# Patient Record
Sex: Female | Born: 1945 | Race: White | Hispanic: No | Smoking: Former smoker
Health system: Southern US, Community
[De-identification: ages and names within clinical notes are randomized; demographics above are authoritative.]

## PROBLEM LIST (undated history)

## (undated) DIAGNOSIS — C7889 Secondary malignant neoplasm of other digestive organs: Secondary | ICD-10-CM

## (undated) DIAGNOSIS — I214 Non-ST elevation (NSTEMI) myocardial infarction: Secondary | ICD-10-CM

## (undated) DIAGNOSIS — J189 Pneumonia, unspecified organism: Secondary | ICD-10-CM

## (undated) DIAGNOSIS — K219 Gastro-esophageal reflux disease without esophagitis: Secondary | ICD-10-CM

## (undated) DIAGNOSIS — F419 Anxiety disorder, unspecified: Secondary | ICD-10-CM

## (undated) DIAGNOSIS — C801 Malignant (primary) neoplasm, unspecified: Secondary | ICD-10-CM

## (undated) DIAGNOSIS — J9610 Chronic respiratory failure, unspecified whether with hypoxia or hypercapnia: Secondary | ICD-10-CM

## (undated) DIAGNOSIS — C787 Secondary malignant neoplasm of liver and intrahepatic bile duct: Secondary | ICD-10-CM

## (undated) DIAGNOSIS — Z87891 Personal history of nicotine dependence: Secondary | ICD-10-CM

## (undated) DIAGNOSIS — E785 Hyperlipidemia, unspecified: Secondary | ICD-10-CM

## (undated) DIAGNOSIS — R531 Weakness: Secondary | ICD-10-CM

## (undated) DIAGNOSIS — J449 Chronic obstructive pulmonary disease, unspecified: Secondary | ICD-10-CM

## (undated) HISTORY — DX: Weakness: R53.1

## (undated) HISTORY — DX: Pneumonia, unspecified organism: J18.9

## (undated) HISTORY — DX: Anxiety disorder, unspecified: F41.9

## (undated) HISTORY — PX: ANGIOPLASTY: SHX39

---

## 1997-11-01 ENCOUNTER — Other Ambulatory Visit: Admission: RE | Admit: 1997-11-01 | Discharge: 1997-11-01 | Payer: Self-pay | Admitting: Family Medicine

## 2001-05-27 ENCOUNTER — Encounter: Payer: Self-pay | Admitting: Family Medicine

## 2001-05-27 ENCOUNTER — Encounter: Admission: RE | Admit: 2001-05-27 | Discharge: 2001-05-27 | Payer: Self-pay | Admitting: Family Medicine

## 2003-09-03 ENCOUNTER — Encounter: Admission: RE | Admit: 2003-09-03 | Discharge: 2003-09-03 | Payer: Self-pay | Admitting: Family Medicine

## 2004-05-04 ENCOUNTER — Emergency Department (HOSPITAL_COMMUNITY): Admission: EM | Admit: 2004-05-04 | Discharge: 2004-05-04 | Payer: Self-pay | Admitting: Emergency Medicine

## 2004-09-21 ENCOUNTER — Encounter: Admission: RE | Admit: 2004-09-21 | Discharge: 2004-09-21 | Payer: Self-pay | Admitting: Family Medicine

## 2004-12-19 ENCOUNTER — Ambulatory Visit (HOSPITAL_COMMUNITY): Admission: RE | Admit: 2004-12-19 | Discharge: 2004-12-19 | Payer: Self-pay | Admitting: Family Medicine

## 2010-04-27 ENCOUNTER — Encounter
Admission: RE | Admit: 2010-04-27 | Discharge: 2010-04-27 | Payer: Self-pay | Source: Home / Self Care | Attending: Family Medicine | Admitting: Family Medicine

## 2011-10-02 ENCOUNTER — Other Ambulatory Visit: Payer: Self-pay | Admitting: Family Medicine

## 2011-10-02 DIAGNOSIS — Z1231 Encounter for screening mammogram for malignant neoplasm of breast: Secondary | ICD-10-CM

## 2011-10-02 DIAGNOSIS — M8580 Other specified disorders of bone density and structure, unspecified site: Secondary | ICD-10-CM

## 2011-10-22 ENCOUNTER — Ambulatory Visit
Admission: RE | Admit: 2011-10-22 | Discharge: 2011-10-22 | Disposition: A | Discharge status: Home / Self Care | Payer: Medicare Other | Source: Ambulatory Visit | Attending: Family Medicine | Admitting: Family Medicine

## 2011-10-22 DIAGNOSIS — Z1231 Encounter for screening mammogram for malignant neoplasm of breast: Secondary | ICD-10-CM

## 2011-10-22 DIAGNOSIS — M8580 Other specified disorders of bone density and structure, unspecified site: Secondary | ICD-10-CM

## 2012-05-22 ENCOUNTER — Emergency Department (HOSPITAL_COMMUNITY): Payer: Medicare Other

## 2012-05-22 ENCOUNTER — Encounter (HOSPITAL_COMMUNITY): Payer: Self-pay | Admitting: Emergency Medicine

## 2012-05-22 ENCOUNTER — Emergency Department (HOSPITAL_COMMUNITY)
Admission: EM | Admit: 2012-05-22 | Discharge: 2012-05-23 | Disposition: A | Discharge status: Home / Self Care | Payer: Medicare Other | Attending: Emergency Medicine | Admitting: Emergency Medicine

## 2012-05-22 DIAGNOSIS — J441 Chronic obstructive pulmonary disease with (acute) exacerbation: Secondary | ICD-10-CM | POA: Insufficient documentation

## 2012-05-22 DIAGNOSIS — J449 Chronic obstructive pulmonary disease, unspecified: Secondary | ICD-10-CM

## 2012-05-22 DIAGNOSIS — J45901 Unspecified asthma with (acute) exacerbation: Secondary | ICD-10-CM | POA: Insufficient documentation

## 2012-05-22 DIAGNOSIS — R509 Fever, unspecified: Secondary | ICD-10-CM | POA: Insufficient documentation

## 2012-05-22 DIAGNOSIS — R05 Cough: Secondary | ICD-10-CM | POA: Insufficient documentation

## 2012-05-22 DIAGNOSIS — Z87891 Personal history of nicotine dependence: Secondary | ICD-10-CM | POA: Insufficient documentation

## 2012-05-22 DIAGNOSIS — R059 Cough, unspecified: Secondary | ICD-10-CM | POA: Insufficient documentation

## 2012-05-22 HISTORY — DX: Chronic obstructive pulmonary disease, unspecified: J44.9

## 2012-05-22 LAB — POCT I-STAT, CHEM 8
Glucose, Bld: 113 mg/dL — ABNORMAL HIGH (ref 70–99)
HCT: 45 % (ref 36.0–46.0)
Hemoglobin: 15.3 g/dL — ABNORMAL HIGH (ref 12.0–15.0)
Potassium: 3.8 mEq/L (ref 3.5–5.1)
Sodium: 138 mEq/L (ref 135–145)

## 2012-05-22 MED ORDER — IPRATROPIUM BROMIDE 0.02 % IN SOLN
0.5000 mg | Freq: Once | RESPIRATORY_TRACT | Status: AC
  Administered 2012-05-22: 0.5 mg via RESPIRATORY_TRACT
  Filled 2012-05-22: qty 2.5

## 2012-05-22 MED ORDER — METHYLPREDNISOLONE SODIUM SUCC 125 MG IJ SOLR
125.0000 mg | Freq: Once | INTRAMUSCULAR | Status: AC
  Administered 2012-05-22: 125 mg via INTRAVENOUS
  Filled 2012-05-22: qty 2

## 2012-05-22 MED ORDER — ALBUTEROL SULFATE (5 MG/ML) 0.5% IN NEBU
5.0000 mg | INHALATION_SOLUTION | Freq: Once | RESPIRATORY_TRACT | Status: AC
  Administered 2012-05-22: 5 mg via RESPIRATORY_TRACT
  Filled 2012-05-22: qty 1

## 2012-05-22 NOTE — ED Notes (Addendum)
Pt c/o sob that started today. Pt states she has had a productive cough with white frothy sputum. Denies any pain. Pt took inhaler pta and states she feels a little better. Pt is usually on 2L O2 at night for her COPD but states she has been on it all day because of the increased SOB. Pt states she also took 325 of aspirin around 7:30.

## 2012-05-22 NOTE — ED Provider Notes (Addendum)
History     CSN: [id number]  Arrival date & time 05/22/12  2051   First MD Initiated Contact with Patient 05/22/12 2105      Chief Complaint  Patient presents with  . Shortness of Breath    (Consider location/radiation/quality/duration/timing/severity/associated sxs/prior treatment) Patient is a 67 y.o. female presenting with shortness of breath. The history is provided by the patient.  Shortness of Breath  The current episode started today. The onset was gradual. The problem occurs continuously. The problem has been gradually worsening. The problem is severe. Nothing relieves the symptoms. The symptoms are aggravated by activity. Associated symptoms include a fever, cough, shortness of breath and wheezing. Pertinent negatives include no chest pain, no chest pressure and no orthopnea. Cough characteristics: frothy sputum that is clear. She has had intermittent steroid use. Her past medical history is significant for past wheezing. Urine output has been normal. The last void occurred less than 6 hours ago. She has received no recent medical care. Services Performed: took spiriva, albuterol and atrovent pta with significant improvement.  states she ran out of her o2 which she uses prn and was scared she would need it.    Past Medical History  Diagnosis Date  . COPD (chronic obstructive pulmonary disease)     History reviewed. No pertinent past surgical history.  Family History  Problem Relation Age of Onset  . Cancer Father     History  Substance Use Topics  . Smoking status: Former Games developer  . Smokeless tobacco: Never Used  . Alcohol Use: Yes     Comment: rarely    OB History    Grav Para Term Preterm Abortions TAB SAB Ect Mult Living                  Review of Systems  Constitutional: Positive for fever.  Respiratory: Positive for cough, shortness of breath and wheezing.   Cardiovascular: Negative for chest pain and orthopnea.  All other systems reviewed and are  negative.    Allergies  Codeine  Home Medications  No current outpatient prescriptions on file.  BP 128/78  Pulse 86  Temp 99 F (37.2 C) (Oral)  Resp 18  SpO2 100%  Physical Exam  Nursing note and vitals reviewed. Constitutional: She is oriented to person, place, and time. She appears well-developed and well-nourished. No distress.  HENT:  Head: Normocephalic and atraumatic.  Mouth/Throat: Oropharynx is clear and moist.  Eyes: Conjunctivae normal and EOM are normal. Pupils are equal, round, and reactive to light.  Neck: Normal range of motion. Neck supple.  Cardiovascular: Normal rate, regular rhythm and intact distal pulses.   No murmur heard. Pulmonary/Chest: Effort normal. Not tachypneic. No respiratory distress. She has decreased breath sounds. She has no wheezes. She has no rales.  Abdominal: Soft. She exhibits no distension. There is no tenderness. There is no rebound and no guarding.  Musculoskeletal: Normal range of motion. She exhibits no edema and no tenderness.  Neurological: She is alert and oriented to person, place, and time.  Skin: Skin is warm and dry. No rash noted. No erythema.  Psychiatric: She has a normal mood and affect. Her behavior is normal.    ED Course  Procedures (including critical care time)  Labs Reviewed  POCT I-STAT, CHEM 8 - Abnormal; Notable for the following:    Glucose, Bld 113 (*)     Hemoglobin 15.3 (*)     All other components within normal limits   Dg Chest 2 View  05/22/2012  *RADIOLOGY REPORT*  Clinical Data: Cough, shortness of breath.  CHEST - 2 VIEW  Comparison: 05/27/2008  Findings: Pulmonary hyperinflation with attenuated bronchovascular markings.  No focal airspace infiltrate or overt edema.  No effusion.  Heart size upper limits normal.  Regional bones unremarkable.  IMPRESSION:  1.  Hyperinflation without acute or superimposed abnormality.   Original Report Authenticated By: D. Andria Rhein, MD      1. COPD (chronic  obstructive pulmonary disease)       MDM   Patient with a history of COPD who is on oxygen when necessary, albuterol, Atrovent and Spiriva at home. Patient states she ran out of her oxygen today and was having worsening shortness of breath with productive cough of a frothy sputum. Patient used her Spiriva and albuterol prior to EMS arrival and now on 1 L of oxygen he states she feels back to normal much better. She's in no acute distress on exam. She has no wheezing and no signs of CHF. She has no peripheral edema but states she's been eating a lot of ham and may have gained 5 pounds.  At this point do not feel that patient needs albuterol and Atrovent. Will give an IV dose of Solu-Medrol per patient's request she states oral prednisone makes her crazy. Chest x-ray and i-STAT pending.  Patient has had a flu and pneumonia shot this year  10:57 PM I-STAT and chest x-ray within normal limits. On recheck patient states she feels slightly short of breath and we'll do albuterol and Atrovent and recheck  12:06 AM After treatment patient is improved. Her oxygen has been 100% whether she is on O2 or not. Discussed this with the patient and will ambulate without oxygen to ensure no hypoxia.  12:30 AM Pt ambulated without dropping sats.  Will d/c.  Gwyneth Sprout, MD 05/23/12 0030  Gwyneth Sprout, MD 05/23/12 1610

## 2012-05-22 NOTE — ED Notes (Signed)
Per EMS pt c/o SOB that started today. Pt has hx of COPD, CHF. Pt has congestion and slight fever 99.9. Pt usually uses 2L O2 at night but today she has used it all day. She used her inhaler pta and said it gave her some relief. Vitals 140/82, 96 HR, 20 Resp, 100% 2L, denies pain, lungs sounds clear.

## 2012-05-23 NOTE — ED Notes (Signed)
The patient was able to ambulate off of O2.  Her O2 sats stayed 96% or higher, and she showed no signs of acute distress.

## 2012-10-06 ENCOUNTER — Other Ambulatory Visit: Payer: Self-pay

## 2012-10-06 DIAGNOSIS — Z1231 Encounter for screening mammogram for malignant neoplasm of breast: Secondary | ICD-10-CM

## 2012-11-14 ENCOUNTER — Ambulatory Visit
Admission: RE | Admit: 2012-11-14 | Discharge: 2012-11-14 | Disposition: A | Discharge status: Home / Self Care | Payer: Medicare Other | Source: Ambulatory Visit

## 2012-11-14 DIAGNOSIS — Z1231 Encounter for screening mammogram for malignant neoplasm of breast: Secondary | ICD-10-CM

## 2013-06-05 ENCOUNTER — Emergency Department (HOSPITAL_COMMUNITY): Payer: Medicare Other

## 2013-06-05 ENCOUNTER — Inpatient Hospital Stay (HOSPITAL_COMMUNITY)
Admission: EM | Admit: 2013-06-05 | Discharge: 2013-06-09 | DRG: 281 | Disposition: A | Discharge status: Home / Self Care | Payer: Medicare Other | Attending: Cardiology | Admitting: Cardiology

## 2013-06-05 ENCOUNTER — Encounter (HOSPITAL_COMMUNITY): Payer: Self-pay | Admitting: Emergency Medicine

## 2013-06-05 DIAGNOSIS — Z8249 Family history of ischemic heart disease and other diseases of the circulatory system: Secondary | ICD-10-CM

## 2013-06-05 DIAGNOSIS — K219 Gastro-esophageal reflux disease without esophagitis: Secondary | ICD-10-CM | POA: Diagnosis present

## 2013-06-05 DIAGNOSIS — Z9981 Dependence on supplemental oxygen: Secondary | ICD-10-CM

## 2013-06-05 DIAGNOSIS — E7800 Pure hypercholesterolemia, unspecified: Secondary | ICD-10-CM | POA: Diagnosis present

## 2013-06-05 DIAGNOSIS — Z889 Allergy status to unspecified drugs, medicaments and biological substances status: Secondary | ICD-10-CM

## 2013-06-05 DIAGNOSIS — J438 Other emphysema: Secondary | ICD-10-CM | POA: Diagnosis present

## 2013-06-05 DIAGNOSIS — E785 Hyperlipidemia, unspecified: Secondary | ICD-10-CM | POA: Diagnosis present

## 2013-06-05 DIAGNOSIS — J449 Chronic obstructive pulmonary disease, unspecified: Secondary | ICD-10-CM

## 2013-06-05 DIAGNOSIS — Y840 Cardiac catheterization as the cause of abnormal reaction of the patient, or of later complication, without mention of misadventure at the time of the procedure: Secondary | ICD-10-CM | POA: Diagnosis not present

## 2013-06-05 DIAGNOSIS — Z79899 Other long term (current) drug therapy: Secondary | ICD-10-CM

## 2013-06-05 DIAGNOSIS — I214 Non-ST elevation (NSTEMI) myocardial infarction: Principal | ICD-10-CM

## 2013-06-05 DIAGNOSIS — IMO0002 Reserved for concepts with insufficient information to code with codable children: Secondary | ICD-10-CM | POA: Diagnosis not present

## 2013-06-05 DIAGNOSIS — I2510 Atherosclerotic heart disease of native coronary artery without angina pectoris: Secondary | ICD-10-CM | POA: Diagnosis present

## 2013-06-05 DIAGNOSIS — R079 Chest pain, unspecified: Secondary | ICD-10-CM

## 2013-06-05 DIAGNOSIS — I959 Hypotension, unspecified: Secondary | ICD-10-CM | POA: Diagnosis not present

## 2013-06-05 DIAGNOSIS — Y9210 Unspecified residential institution as the place of occurrence of the external cause: Secondary | ICD-10-CM | POA: Diagnosis not present

## 2013-06-05 DIAGNOSIS — R197 Diarrhea, unspecified: Secondary | ICD-10-CM | POA: Diagnosis present

## 2013-06-05 DIAGNOSIS — Z7982 Long term (current) use of aspirin: Secondary | ICD-10-CM

## 2013-06-05 DIAGNOSIS — Z87891 Personal history of nicotine dependence: Secondary | ICD-10-CM

## 2013-06-05 HISTORY — DX: Gastro-esophageal reflux disease without esophagitis: K21.9

## 2013-06-05 HISTORY — DX: Hyperlipidemia, unspecified: E78.5

## 2013-06-05 HISTORY — DX: Personal history of nicotine dependence: Z87.891

## 2013-06-05 HISTORY — DX: Non-ST elevation (NSTEMI) myocardial infarction: I21.4

## 2013-06-05 HISTORY — DX: Chronic respiratory failure, unspecified whether with hypoxia or hypercapnia: J96.10

## 2013-06-05 LAB — CBC
HEMATOCRIT: 37.3 % (ref 36.0–46.0)
Hemoglobin: 12.7 g/dL (ref 12.0–15.0)
MCH: 29.5 pg (ref 26.0–34.0)
MCHC: 34 g/dL (ref 30.0–36.0)
MCV: 86.5 fL (ref 78.0–100.0)
PLATELETS: 219 10*3/uL (ref 150–400)
RBC: 4.31 MIL/uL (ref 3.87–5.11)
RDW: 14.6 % (ref 11.5–15.5)
WBC: 10.9 10*3/uL — AB (ref 4.0–10.5)

## 2013-06-05 LAB — BASIC METABOLIC PANEL
BUN: 12 mg/dL (ref 6–23)
CALCIUM: 9 mg/dL (ref 8.4–10.5)
CHLORIDE: 97 meq/L (ref 96–112)
CO2: 24 meq/L (ref 19–32)
CREATININE: 0.52 mg/dL (ref 0.50–1.10)
GFR calc Af Amer: >90 mL/min (ref >90)
GFR calc non Af Amer: >90 mL/min (ref >90)
Glucose, Bld: 112 mg/dL — ABNORMAL HIGH (ref 70–99)
Potassium: 4.5 mEq/L (ref 3.7–5.3)
Sodium: 132 mEq/L — ABNORMAL LOW (ref 137–147)

## 2013-06-05 LAB — POCT I-STAT TROPONIN I: Troponin i, poc: 1.21 ng/mL (ref 0.00–0.08)

## 2013-06-05 LAB — TROPONIN I: TROPONIN I: 2.78 ng/mL — AB (ref <0.30)

## 2013-06-05 MED ORDER — ALBUTEROL SULFATE (2.5 MG/3ML) 0.083% IN NEBU
5.0000 mg | INHALATION_SOLUTION | Freq: Once | RESPIRATORY_TRACT | Status: AC
  Administered 2013-06-05: 5 mg via RESPIRATORY_TRACT
  Filled 2013-06-05: qty 6

## 2013-06-05 MED ORDER — HEPARIN BOLUS VIA INFUSION
3000.0000 [IU] | Freq: Once | INTRAVENOUS | Status: AC
  Administered 2013-06-05: 3000 [IU] via INTRAVENOUS
  Filled 2013-06-05: qty 3000

## 2013-06-05 MED ORDER — ASPIRIN 81 MG PO CHEW
324.0000 mg | CHEWABLE_TABLET | Freq: Once | ORAL | Status: AC
  Filled 2013-06-05 (×2): qty 4

## 2013-06-05 MED ORDER — HEPARIN (PORCINE) IN NACL 100-0.45 UNIT/ML-% IJ SOLN
900.0000 [IU]/h | INTRAMUSCULAR | Status: DC
  Administered 2013-06-05: 800 [IU]/h via INTRAVENOUS
  Administered 2013-06-06: 900 [IU]/h via INTRAVENOUS
  Filled 2013-06-05 (×4): qty 250

## 2013-06-05 NOTE — H&P (Signed)
Brooke [name] is an 68 y.o. female.     Chief Complaint: shortness of breath Primary cardiologist: None HPI: Brooke [name] is a 68 yo woman with PMH of prior tobacco use (quit tobacco 5 years ago) COPD one prn 2L home oxygen who became stressed after misplacing her car keys at the grocery store this afternoon around 14:30 leading to significant walking around and ultimately some jaw and right shoulder pain with burning character of pain. In the last three weeks she is also noted to have new GERD as well as some musculoskeletal pain that she has seen her PCP for. She notes some diarrhea. She called EMS because of her symptoms but placing on her oxygen improved the pain and she's been asymptomatic in the ER. She uses her oxygen at night. She has no sick contacts, no fever/chills/cough. Family history of MI at earlier age. In the ER she was noted to have troponin of 2.78. Aspirin and heparin started/given in the ER.       Past Medical History  Diagnosis Date  . COPD (chronic obstructive pulmonary disease)     History reviewed. No pertinent past surgical history.  Family History  Problem Relation Age of Onset  . Cancer Father    Social History:  reports that she has quit smoking. She has never used smokeless tobacco. She reports that she drinks alcohol. She reports that she does not use illicit drugs.  Allergies:  Allergies  Allergen Reactions  . Codeine Nausea And Vomiting  . Prednisone Other (See Comments)    Reaction:Abnormal behavior; cannot take in pill form but CAN tolerate the injection    Medications Prior to Admission  Medication Sig Dispense Refill  . albuterol (PROVENTIL HFA;VENTOLIN HFA) 108 (90 BASE) MCG/ACT inhaler Inhale 2 puffs into the lungs every 6 (six) hours as needed. For wheezing/shortness of breath      . aspirin 81 MG tablet Take 81 mg by mouth daily as needed for pain.      . budesonide-formoterol (SYMBICORT) 160-4.5 MCG/ACT inhaler Inhale 2 puffs into the  lungs 2 (two) times daily.      Marland Kitchen ipratropium-albuterol (DUONEB) 0.5-2.5 (3) MG/3ML SOLN Take 3 mLs by nebulization 2 (two) times daily.        Results for orders placed during the hospital encounter of 06/05/13 (from the past 48 hour(s))  CBC     Status: Abnormal   Collection Time    06/05/13  7:50 PM      Result Value Range   WBC 10.9 (*) 4.0 - 10.5 K/uL   RBC 4.31  3.87 - 5.11 MIL/uL   Hemoglobin 12.7  12.0 - 15.0 g/dL   HCT 37.3  36.0 - 46.0 %   MCV 86.5  78.0 - 100.0 fL   MCH 29.5  26.0 - 34.0 pg   MCHC 34.0  30.0 - 36.0 g/dL   RDW 14.6  11.5 - 15.5 %   Platelets 219  150 - 400 K/uL  BASIC METABOLIC PANEL     Status: Abnormal   Collection Time    06/05/13  7:50 PM      Result Value Range   Sodium 132 (*) 137 - 147 mEq/L   Potassium 4.5  3.7 - 5.3 mEq/L   Chloride 97  96 - 112 mEq/L   CO2 24  19 - 32 mEq/L   Glucose, Bld 112 (*) 70 - 99 mg/dL   BUN 12  6 - 23 mg/dL   Creatinine, Ser 0.52  0.50 - 1.10 mg/dL   Calcium 9.0  8.4 - 10.5 mg/dL   GFR calc non Af Amer >90  >90 mL/min   GFR calc Af Amer >90  >90 mL/min   Comment: (NOTE)     The eGFR has been calculated using the CKD EPI equation.     This calculation has not been validated in all clinical situations.     eGFR's persistently <90 mL/min signify possible Chronic Kidney     Disease.  TROPONIN I     Status: Abnormal   Collection Time    06/05/13  7:50 PM      Result Value Range   Troponin I 2.78 (*) <0.30 ng/mL   Comment:            Due to the release kinetics of cTnI,     a negative result within the first hours     of the onset of symptoms does not rule out     myocardial infarction with certainty.     If myocardial infarction is still suspected,     repeat the test at appropriate intervals.     CRITICAL RESULT CALLED TO, READ BACK BY AND VERIFIED WITH:     BROWN,A RN 06/05/2013 2123 [address]  POCT I-STAT TROPONIN I     Status: Abnormal   Collection Time    06/05/13  7:56 PM      Result Value Range    Troponin i, poc 1.21 (*) 0.00 - 0.08 ng/mL   Comment NOTIFIED PHYSICIAN     Comment 3            Comment: Due to the release kinetics of cTnI,     a negative result within the first hours     of the onset of symptoms does not rule out     myocardial infarction with certainty.     If myocardial infarction is still suspected,     repeat the test at appropriate intervals.   Dg Chest 2 View  06/05/2013   CLINICAL DATA:  Shoulder pain and jaw pain  EXAM: CHEST  2 VIEW  COMPARISON:  None.  FINDINGS: Normal cardiac silhouette. Lungs are hyperinflated. There is chronic bronchitic change centrally. No focal consolidation. No pleural fluid. No pneumothorax.  IMPRESSION: Emphysematous change.  No acute findings.   Electronically Signed   By: Suzy Bouchard M.D.   On: 06/05/2013 21:07    Review of Systems  Constitutional: Positive for fever, chills and malaise/fatigue. Negative for weight loss.  HENT: Negative for tinnitus.   Eyes: Negative for blurred vision and pain.  Respiratory: Positive for shortness of breath. Negative for cough and sputum production.   Cardiovascular: Positive for chest pain. Negative for palpitations and orthopnea.  Gastrointestinal: Negative for nausea and vomiting.  Genitourinary: Negative for dysuria and hematuria.  Musculoskeletal: Positive for joint pain.  Skin: Negative for rash.  Neurological: Negative for dizziness, tingling, tremors and headaches.  Endo/Heme/Allergies: Positive for environmental allergies. Negative for polydipsia.  Psychiatric/Behavioral: Negative for suicidal ideas, hallucinations and substance abuse.    Blood pressure 124/68, pulse 79, temperature 98.3 F (36.8 C), temperature source Oral, resp. rate 18, height '5\' 3"'  (1.6 m), weight 63.186 kg (139 lb 4.8 oz), SpO2 100.00%. Physical Exam  Nursing note and vitals reviewed. Constitutional: She is oriented to person, place, and time. She appears well-developed and well-nourished. No distress.    HENT:  Head: Normocephalic and atraumatic.  Mouth/Throat: No oropharyngeal exudate.  Oxygen cannula on  Eyes:  Conjunctivae and EOM are normal. Pupils are equal, round, and reactive to light. No scleral icterus.  Neck: Normal range of motion. Neck supple. JVD present. No tracheal deviation present. No thyromegaly present.  2 cm above clavicle  Cardiovascular: Normal rate, regular rhythm, normal heart sounds and intact distal pulses.  Exam reveals no gallop.   No murmur heard. Musculoskeletal: Normal range of motion. She exhibits no tenderness.  Trace edema bilaterally  Neurological: She is alert and oriented to person, place, and time. No cranial nerve deficit. Coordination normal.  Skin: Skin is warm and dry. No rash noted. She is not diaphoretic. No erythema.  Psychiatric: She has a normal mood and affect. Her behavior is normal. Thought content normal.    Labs reviewed; wbc 10.9, h/h 12.7/37.3, na 132, K 4.5, bun/crx 12/0.52, glucose 112 Trop 2.78 EKG: NSR Chest x-ray: no acute process  Problem List Chest Pain/NSTEMI Elevated troponin COPD on prn home oxygen GERD  Assessment/Plan Ms. Madonia is a 68 yo woman with PMH of COPD, new diagnosis of GERD who presents with shortness of breath, shoulder pain/jaw pain and found to have elevated troponin concerning for NSTEMI.  - aspirin, heparin gtt per pharmacy consult - continue oxygen, inhalers for COPD - continue smoking cessation (quit 5 years ago) - telemetry admission, trend cardiac markers - suspect LHC Monday unless symptoms escalate - TSH, BNP, hba1c, Mg - Echocardiogram   Steele Stracener 06/05/2013, 11:58 PM

## 2013-06-05 NOTE — ED Notes (Signed)
Per EMS, pt coming from home with c/o jaw and shoulder pain. Pt has h/o COPD and his on O2 as needed. Pt reports going out to the store today without her O2 and when she got home she had sudden onset of pain in her jaw and shoulder. Pt reports she was on 2L O2 [place] when this occurred. Pt reports she would take the O2 off and then would feel nauseas so she would put it back on and would feel better. Pt called PCP and was told to call EMS. Pt denies CP. EMS gave 1 nitro in route. Initial BP 160/102, after given nitro pt BP dropped to 84/57. EMS gave IV fluids and increased BP to 123/81.

## 2013-06-05 NOTE — Progress Notes (Signed)
ANTICOAGULATION CONSULT NOTE - Initial Consult  Pharmacy Consult for heparin Indication: chest pain/ACS  Allergies  Allergen Reactions  . Codeine Nausea And Vomiting  . Prednisone Other (See Comments)    Reaction:Abnormal behavior; cannot take in pill form but CAN tolerate the injection    Patient Measurements:   Heparin Dosing Weight: 56 kg  Vital Signs: Temp: 97.8 F (36.6 C) (01/30 1916) Temp src: Oral (01/30 1916) BP: 138/77 mmHg (01/30 1916) Pulse Rate: 75 (01/30 1916)  Labs:  Recent Labs  06/05/13 1950  HGB 12.7  HCT 37.3  PLT 219  CREATININE 0.52    The CrCl is unknown because both a height and weight (above a minimum accepted value) are required for this calculation.   Medical History: Past Medical History  Diagnosis Date  . COPD (chronic obstructive pulmonary disease)     Medications:  See home med list   Assessment: 69 year old woman with complaints to start on heparin for ACS. Goal of Therapy:  Heparin level 0.3-0.7 units/ml Monitor platelets by anticoagulation protocol: Yes   Plan:  Give 3000 units bolus x 1 Start heparin infusion at 800 units/hr Check anti-Xa level in 6 hours and daily while on heparin Continue to monitor H&H and platelets  Candie Mile 06/05/2013,8:49 PM

## 2013-06-05 NOTE — ED Provider Notes (Signed)
CSN: [id number]     Arrival date & time 06/05/13  1900 History   First MD Initiated Contact with Patient 06/05/13 2003     Chief Complaint  Patient presents with  . Jaw Pain  . Shoulder Pain   (Consider location/radiation/quality/duration/timing/severity/associated sxs/prior Treatment) HPI History provided by pt.   Pt has COPD and is on 2L home O2 prn.  Lost her keys while at the grocery store at 2:30pm.  Became stressed and anxious and had to walk around for an extended period of time, trying to find them.  Felt a little better upon resting and putting O2 on in her car, but developed intermittent,  burning pain in her jaw and right shoulder shortly after.  Initially attributed to acid reflux, which she  developed ~3 weeks ago, as well as musculoskeletal pain from carrying groceries, but persisted for several hours and her PCP instructed her to call EMS.  Pain was associated w/ nausea, but only when she took off O2, as well as diarrhea.  Pain improved by the time EMS arrived and resolved after receiving SL ntg.  She is currently asymptomatic.  She did not have chest pain at any time.  SOB worse than baseline; generally only uses O2 at night.  No recent fever, cough, abd pain.  Quit smoking 5 years ago.  Possible FH MI at age 7.  Past Medical History  Diagnosis Date  . COPD (chronic obstructive pulmonary disease)    History reviewed. No pertinent past surgical history. Family History  Problem Relation Age of Onset  . Cancer Father    History  Substance Use Topics  . Smoking status: Former Research scientist (life sciences)  . Smokeless tobacco: Never Used  . Alcohol Use: Yes     Comment: rarely   OB History   Grav Para Term Preterm Abortions TAB SAB Ect Mult Living                 Review of Systems  All other systems reviewed and are negative.    Allergies  Codeine and Prednisone  Home Medications   Current Outpatient Rx  Name  Route  Sig  Dispense  Refill  . albuterol (PROVENTIL HFA;VENTOLIN HFA)  108 (90 BASE) MCG/ACT inhaler   Inhalation   Inhale 2 puffs into the lungs every 6 (six) hours as needed. For wheezing/shortness of breath         . budesonide-formoterol (SYMBICORT) 160-4.5 MCG/ACT inhaler   Inhalation   Inhale 2 puffs into the lungs 2 (two) times daily.         . Calcium Carbonate-Vitamin D ([employer] CALCIUM/VIT D SOFT CHEWS PO)   Oral   Take by mouth 2 (two) times daily. Takes 1 chocolate chew twice daily         . ipratropium-albuterol (DUONEB) 0.5-2.5 (3) MG/3ML SOLN   Nebulization   Take 3 mLs by nebulization 2 (two) times daily.          BP 138/77  Pulse 75  Temp(Src) 97.8 F (36.6 C) (Oral)  Resp 22  SpO2 98% Physical Exam  Nursing note and vitals reviewed. Constitutional: She is oriented to person, place, and time. She appears well-developed and well-nourished. No distress.  HENT:  Head: Normocephalic and atraumatic.  Eyes:  Normal appearance  Neck: Normal range of motion.  Cardiovascular: Normal rate, regular rhythm and intact distal pulses.   Pulmonary/Chest: Effort normal and breath sounds normal. No respiratory distress. She exhibits no tenderness.  No pleuritic pain reported  Abdominal:  Soft. Bowel sounds are normal. She exhibits no distension. There is no tenderness. There is no guarding.  Musculoskeletal: Normal range of motion.  No peripheral edema or calf tenderness  Neurological: She is alert and oriented to person, place, and time.  Skin: Skin is warm and dry. No rash noted.  Psychiatric: She has a normal mood and affect. Her behavior is normal.    ED Course  Procedures (including critical care time) Labs Review Labs Reviewed  CBC - Abnormal; Notable for the following:    WBC 10.9 (*)    All other components within normal limits  BASIC METABOLIC PANEL - Abnormal; Notable for the following:    Sodium 132 (*)    Glucose, Bld 112 (*)    All other components within normal limits  TROPONIN I - Abnormal; Notable for the following:     Troponin I 2.78 (*)    All other components within normal limits  POCT I-STAT TROPONIN I - Abnormal; Notable for the following:    Troponin i, poc 1.21 (*)    All other components within normal limits  HEPARIN LEVEL (UNFRACTIONATED)  CBC   Imaging Review No results found.  EKG Interpretation    Date/Time:  Friday June 05 2013 19:13:08 EST Ventricular Rate:  72 PR Interval:  151 QRS Duration: 73 QT Interval:  412 QTC Calculation: 451 R Axis:   79 Text Interpretation:  Normal sinus rhythm Inverted T waves AVL No old tracing to compare Confirmed by GOLDSTON  MD, SCOTT ([id number]) on 06/05/2013 7:18:54 PM            MDM   1. NSTEMI (non-ST elevated myocardial infarction)    68yo F w/ COPD, on home O2 prn, and reported new onset acid reflux 3-4 weeks ago, presents w/ exertional SOB, jaw and right shoulder pain this afternoon.  Currently asx; pain improved upon EMS arrival but resolved w/ SL ntg.  VS nml and pt in NAD currently.  EKG non-ischemic.  I-Stat troponin 1.21 and Troponin I pending.  Heparin ordered and pt had aspirin pta.  8:54 PM   Troponin 2.78.  Repeat EKG unchanged.  Pt aware of all test results. She is requesting her pm breathing treatment d/t increased SOB, though that is typical for this time of day.  VSS.  Cardiology consulted for admission for NSTEMI.  10:27 PM   Remer Macho, PA-C 06/05/13 2227

## 2013-06-06 DIAGNOSIS — I519 Heart disease, unspecified: Secondary | ICD-10-CM

## 2013-06-06 LAB — CBC WITH DIFFERENTIAL/PLATELET
BASOS ABS: 0 10*3/uL (ref 0.0–0.1)
BASOS PCT: 0 % (ref 0–1)
EOS ABS: 0 10*3/uL (ref 0.0–0.7)
Eosinophils Relative: 0 % (ref 0–5)
HEMATOCRIT: 36.2 % (ref 36.0–46.0)
HEMOGLOBIN: 12.4 g/dL (ref 12.0–15.0)
Lymphocytes Relative: 16 % (ref 12–46)
Lymphs Abs: 1.5 10*3/uL (ref 0.7–4.0)
MCH: 29.7 pg (ref 26.0–34.0)
MCHC: 34.3 g/dL (ref 30.0–36.0)
MCV: 86.6 fL (ref 78.0–100.0)
MONO ABS: 0.6 10*3/uL (ref 0.1–1.0)
MONOS PCT: 6 % (ref 3–12)
NEUTROS ABS: 7 10*3/uL (ref 1.7–7.7)
Neutrophils Relative %: 77 % (ref 43–77)
Platelets: 213 10*3/uL (ref 150–400)
RBC: 4.18 MIL/uL (ref 3.87–5.11)
RDW: 14.8 % (ref 11.5–15.5)
WBC: 9.1 10*3/uL (ref 4.0–10.5)

## 2013-06-06 LAB — BASIC METABOLIC PANEL
BUN: 8 mg/dL (ref 6–23)
CHLORIDE: 103 meq/L (ref 96–112)
CO2: 22 mEq/L (ref 19–32)
Calcium: 8.8 mg/dL (ref 8.4–10.5)
Creatinine, Ser: 0.49 mg/dL — ABNORMAL LOW (ref 0.50–1.10)
GFR calc Af Amer: >90 mL/min (ref >90)
GLUCOSE: 105 mg/dL — AB (ref 70–99)
POTASSIUM: 3.7 meq/L (ref 3.7–5.3)
SODIUM: 139 meq/L (ref 137–147)

## 2013-06-06 LAB — CBC
HEMATOCRIT: 37.8 % (ref 36.0–46.0)
HEMOGLOBIN: 12.7 g/dL (ref 12.0–15.0)
MCH: 29.2 pg (ref 26.0–34.0)
MCHC: 33.6 g/dL (ref 30.0–36.0)
MCV: 86.9 fL (ref 78.0–100.0)
Platelets: 200 10*3/uL (ref 150–400)
RBC: 4.35 MIL/uL (ref 3.87–5.11)
RDW: 14.8 % (ref 11.5–15.5)
WBC: 7.1 10*3/uL (ref 4.0–10.5)

## 2013-06-06 LAB — LIPID PANEL
CHOL/HDL RATIO: 2.8 ratio
CHOLESTEROL: 219 mg/dL — AB (ref 0–200)
HDL: 78 mg/dL (ref >39)
LDL Cholesterol: 125 mg/dL — ABNORMAL HIGH (ref 0–99)
Triglycerides: 81 mg/dL (ref <150)
VLDL: 16 mg/dL (ref 0–40)

## 2013-06-06 LAB — COMPREHENSIVE METABOLIC PANEL
ALBUMIN: 3.3 g/dL — AB (ref 3.5–5.2)
ALT: 16 U/L (ref 0–35)
AST: 26 U/L (ref 0–37)
Alkaline Phosphatase: 54 U/L (ref 39–117)
BUN: 8 mg/dL (ref 6–23)
CALCIUM: 8.9 mg/dL (ref 8.4–10.5)
CO2: 24 mEq/L (ref 19–32)
CREATININE: 0.48 mg/dL — AB (ref 0.50–1.10)
Chloride: 101 mEq/L (ref 96–112)
GFR calc Af Amer: >90 mL/min (ref >90)
GFR calc non Af Amer: >90 mL/min (ref >90)
GLUCOSE: 113 mg/dL — AB (ref 70–99)
Potassium: 4.1 mEq/L (ref 3.7–5.3)
Sodium: 138 mEq/L (ref 137–147)
TOTAL PROTEIN: 6.3 g/dL (ref 6.0–8.3)
Total Bilirubin: 0.3 mg/dL (ref 0.3–1.2)

## 2013-06-06 LAB — PROTIME-INR
INR: 0.97 (ref 0.00–1.49)
Prothrombin Time: 12.7 seconds (ref 11.6–15.2)

## 2013-06-06 LAB — TROPONIN I
TROPONIN I: 1.11 ng/mL — AB (ref <0.30)
TROPONIN I: 0.83 ng/mL — AB (ref <0.30)
Troponin I: 2.63 ng/mL (ref <0.30)

## 2013-06-06 LAB — HEPARIN LEVEL (UNFRACTIONATED)
HEPARIN UNFRACTIONATED: 0.29 [IU]/mL — AB (ref 0.30–0.70)
Heparin Unfractionated: 0.43 IU/mL (ref 0.30–0.70)

## 2013-06-06 LAB — PRO B NATRIURETIC PEPTIDE: PRO B NATRI PEPTIDE: 994.7 pg/mL — AB (ref 0–125)

## 2013-06-06 LAB — HEMOGLOBIN A1C
HEMOGLOBIN A1C: 5.5 % (ref <5.7)
Mean Plasma Glucose: 111 mg/dL (ref <117)

## 2013-06-06 LAB — TSH: TSH: 1.241 u[IU]/mL (ref 0.350–4.500)

## 2013-06-06 LAB — MAGNESIUM: MAGNESIUM: 2.1 mg/dL (ref 1.5–2.5)

## 2013-06-06 LAB — APTT: APTT: 56 s — AB (ref 24–37)

## 2013-06-06 MED ORDER — IPRATROPIUM-ALBUTEROL 0.5-2.5 (3) MG/3ML IN SOLN
3.0000 mL | Freq: Two times a day (BID) | RESPIRATORY_TRACT | Status: DC
  Administered 2013-06-06 – 2013-06-08 (×6): 3 mL via RESPIRATORY_TRACT
  Filled 2013-06-06 (×7): qty 3

## 2013-06-06 MED ORDER — ALUM & MAG HYDROXIDE-SIMETH 200-200-20 MG/5ML PO SUSP
30.0000 mL | ORAL | Status: DC | PRN
  Administered 2013-06-06 – 2013-06-09 (×3): 30 mL via ORAL
  Filled 2013-06-06 (×2): qty 30

## 2013-06-06 MED ORDER — NITROGLYCERIN 0.4 MG SL SUBL
0.4000 mg | SUBLINGUAL_TABLET | SUBLINGUAL | Status: DC | PRN
  Administered 2013-06-06: 0.4 mg via SUBLINGUAL

## 2013-06-06 MED ORDER — METOPROLOL TARTRATE 12.5 MG HALF TABLET
12.5000 mg | ORAL_TABLET | Freq: Two times a day (BID) | ORAL | Status: DC
  Administered 2013-06-06: 12.5 mg via ORAL
  Filled 2013-06-06 (×9): qty 1

## 2013-06-06 MED ORDER — ACETAMINOPHEN 325 MG PO TABS
650.0000 mg | ORAL_TABLET | ORAL | Status: DC | PRN
  Administered 2013-06-07: 650 mg via ORAL
  Filled 2013-06-06: qty 2

## 2013-06-06 MED ORDER — ASPIRIN EC 81 MG PO TBEC
81.0000 mg | DELAYED_RELEASE_TABLET | Freq: Every day | ORAL | Status: DC
  Administered 2013-06-07 – 2013-06-09 (×2): 81 mg via ORAL
  Filled 2013-06-06 (×3): qty 1

## 2013-06-06 MED ORDER — ALBUTEROL SULFATE (2.5 MG/3ML) 0.083% IN NEBU
2.5000 mg | INHALATION_SOLUTION | Freq: Four times a day (QID) | RESPIRATORY_TRACT | Status: DC | PRN
  Administered 2013-06-06: 2.5 mg via RESPIRATORY_TRACT
  Filled 2013-06-06: qty 3

## 2013-06-06 MED ORDER — ONDANSETRON HCL 4 MG/2ML IJ SOLN: 4.0000 mg | Freq: Four times a day (QID) | INTRAMUSCULAR | Status: DC | PRN

## 2013-06-06 MED ORDER — BUDESONIDE-FORMOTEROL FUMARATE 160-4.5 MCG/ACT IN AERO
2.0000 | INHALATION_SPRAY | Freq: Two times a day (BID) | RESPIRATORY_TRACT | Status: DC
  Administered 2013-06-06 – 2013-06-09 (×7): 2 via RESPIRATORY_TRACT
  Filled 2013-06-06: qty 6

## 2013-06-06 MED ORDER — ATORVASTATIN CALCIUM 80 MG PO TABS
80.0000 mg | ORAL_TABLET | Freq: Every day | ORAL | Status: DC
  Administered 2013-06-06 – 2013-06-08 (×4): 80 mg via ORAL
  Filled 2013-06-06 (×5): qty 1

## 2013-06-06 NOTE — Progress Notes (Addendum)
Pts friend came to the nurses desk stating she is having the same symptoms that she had when she was admitted to the hospital last night. Went into the patients room v/s were elevated in the 161'W systolic. Pain in her jaw 5-6/10. O2 was 99% on 0.5 liters. Increased O2 to 2 liters. Pt usually have 2 liters of O2 at night at home. Pt didn't know if it was reflux. Pt usually does not have a history of reflux. EKG obtained showed no changes with normal sinus rhythm. 1sl nitro given. Waited 5 min pain completely relieved. BP reduced to low 960'A systolic. Pt's normal BP is in the low 100's. Pt felt better. RN notified. Will continue to monitor patient. Told patient to call with further symptoms.

## 2013-06-06 NOTE — ED Provider Notes (Signed)
Medical screening examination/treatment/procedure(s) were performed by non-physician practitioner and as supervising physician I was immediately available for consultation/collaboration.      Hoy Morn, MD 06/06/13 [phone]

## 2013-06-06 NOTE — Progress Notes (Signed)
ANTICOAGULATION CONSULT NOTE - Follow Up Consult  Pharmacy Consult for heparin Indication: chest pain/ACS  Labs:  Recent Labs  06/05/13 1950 06/06/13 0105 06/06/13 0313 06/06/13 0600 06/06/13 1140  HGB 12.7  --  12.4 12.7  --   HCT 37.3  --  36.2 37.8  --   PLT 219  --  213 200  --   APTT  --   --  56*  --   --   LABPROT  --   --  12.7  --   --   INR  --   --  0.97  --   --   HEPARINUNFRC  --   --  0.29*  --  0.43  CREATININE 0.52  --  0.48* 0.49*  --   TROPONINI 2.78* 2.63*  --  1.11*  --     Assessment: 68 yo female on heparin drip for nstemi.  HL is therapeutic, CBC is stable.   Pt undergoing cath on Monday.  Goal of Therapy:  Heparin level 0.3-0.7 units/ml   Plan:  Continue heparin drip at 900 units/hr Daily HL/CBC  Hughes Better, PharmD, BCPS Clinical Pharmacist 06/06/2013

## 2013-06-06 NOTE — Progress Notes (Signed)
Patient Name: Brooke [name] Date of Encounter: 06/06/2013     Principal Problem:   NSTEMI (non-ST elevated myocardial infarction) Active Problems:   COPD (chronic obstructive pulmonary disease)    SUBJECTIVE  The patient denies any chest discomfort this morning.  Her previous shoulder and jaw pain has resolved.  Her troponin I levels are trending downward.  Telemetry shows normal sinus rhythm  CURRENT MEDS . aspirin  324 mg Oral Once  . [START ON 06/07/2013] aspirin EC  81 mg Oral Daily  . atorvastatin  80 mg Oral q1800  . budesonide-formoterol  2 puff Inhalation BID  . ipratropium-albuterol  3 mL Nebulization BID  . metoprolol tartrate  12.5 mg Oral BID    OBJECTIVE  Filed Vitals:   06/05/13 2200 06/05/13 2319 06/06/13 0355 06/06/13 0451  BP: 129/69 124/68  94/62  Pulse: 80 79  67  Temp:  98.3 F (36.8 C)  98.1 F (36.7 C)  TempSrc:  Oral  Oral  Resp: 12 18  18   Height:  5\' 3"  (1.6 m)    Weight:  139 lb 4.8 oz (63.186 kg)  139 lb 4.8 oz (63.186 kg)  SpO2: 99% 100% 97% 98%    Intake/Output Summary (Last 24 hours) at 06/06/13 0756 Last data filed at 06/06/13 0500  Gross per 24 hour  Intake 412.47 ml  Output    600 ml  Net -187.53 ml   Filed Weights   06/05/13 2319 06/06/13 0451  Weight: 139 lb 4.8 oz (63.186 kg) 139 lb 4.8 oz (63.186 kg)    PHYSICAL EXAM  General: Pleasant, NAD. Neuro: Alert and oriented X 3. Moves all extremities spontaneously. Psych: Normal affect. HEENT:  Normal  Neck: Supple without bruits or JVD. Lungs:  Resp regular and unlabored, CTA. Heart: RRR no s3, s4, or murmurs. Abdomen: Soft, non-tender, non-distended, BS + x 4.  Extremities: No clubbing, cyanosis or edema. DP/PT/Radials 2+ and equal bilaterally.  Accessory Clinical Findings  CBC  Recent Labs  06/05/13 1950 06/06/13 0313 06/06/13 0600  WBC 10.9* 9.1 7.1  NEUTROABS  --  7.0  --   HGB 12.7 12.4 12.7  HCT 37.3 36.2 37.8  MCV 86.5 86.6 86.9  PLT 219 213  656   Basic Metabolic Panel  Recent Labs  06/05/13 1950 06/06/13 0313 06/06/13 0600  NA 132* 138 139  K 4.5 4.1 3.7  CL 97 101 103  CO2 24 24 22   GLUCOSE 112* 113* 105*  BUN 12 8 8   CREATININE 0.52 0.48* 0.49*  CALCIUM 9.0 8.9 8.8  MG  --  2.1  --    Liver Function Tests  Recent Labs  06/06/13 0313  AST 26  ALT 16  ALKPHOS 54  BILITOT 0.3  PROT 6.3  ALBUMIN 3.3*   No results found for this basename: LIPASE, AMYLASE,  in the last 72 hours Cardiac Enzymes  Recent Labs  06/05/13 1950 06/06/13 0105 06/06/13 0600  TROPONINI 2.78* 2.63* 1.11*   BNP No components found with this basename: POCBNP,  D-Dimer No results found for this basename: DDIMER,  in the last 72 hours Hemoglobin A1C No results found for this basename: HGBA1C,  in the last 72 hours Fasting Lipid Panel  Recent Labs  06/06/13 0600  CHOL 219*  HDL 78  LDLCALC 125*  TRIG 81  CHOLHDL 2.8   Thyroid Function Tests No results found for this basename: TSH, T4TOTAL, FREET3, T3FREE, THYROIDAB,  in the last 72 hours  TELE  Normal sinus rhythm  ECG  EKG this morning  has not yet been completed  Radiology/Studies  Dg Chest 2 View  06/05/2013   CLINICAL DATA:  Shoulder pain and jaw pain  EXAM: CHEST  2 VIEW  COMPARISON:  None.  FINDINGS: Normal cardiac silhouette. Lungs are hyperinflated. There is chronic bronchitic change centrally. No focal consolidation. No pleural fluid. No pneumothorax.  IMPRESSION: Emphysematous change.  No acute findings.   Electronically Signed   By: Suzy Bouchard M.D.   On: 06/05/2013 21:07    ASSESSMENT AND PLAN 1. non-STEMI 2. oxygen-dependent COPD 3. hypercholesterolemia with LDL 125  Plan: Continue aspirin,heparin drip, Lipitor, low-dose beta blocker Left heart catheterization on Monday.  Discussed with patient.   Signed, Darlin Coco MD

## 2013-06-06 NOTE — Progress Notes (Signed)
ANTICOAGULATION CONSULT NOTE - Follow Up Consult  Pharmacy Consult for heparin Indication: chest pain/ACS  Labs:  Recent Labs  06/05/13 1950 06/06/13 0105 06/06/13 0313  HGB 12.7  --  12.4  HCT 37.3  --  36.2  PLT 219  --  213  APTT  --   --  56*  LABPROT  --   --  12.7  INR  --   --  0.97  HEPARINUNFRC  --   --  0.29*  CREATININE 0.52  --  0.48*  TROPONINI 2.78* 2.63*  --     Assessment: 68yo female slightly subtherapeutic on heparin with initial dosing for CP.  Goal of Therapy:  Heparin level 0.3-0.7 units/ml   Plan:  Will increase heparin gtt slightly to 900 units/hr and check level in 6hr.  Wynona Neat, PharmD, BCPS  06/06/2013,5:49 AM

## 2013-06-06 NOTE — Progress Notes (Signed)
  Echocardiogram 2D Echocardiogram has been performed.  Mauricio Po 06/06/2013, 4:48 PM

## 2013-06-07 DIAGNOSIS — I214 Non-ST elevation (NSTEMI) myocardial infarction: Secondary | ICD-10-CM

## 2013-06-07 LAB — CBC
HCT: 36.9 % (ref 36.0–46.0)
HEMOGLOBIN: 12.3 g/dL (ref 12.0–15.0)
MCH: 29.5 pg (ref 26.0–34.0)
MCHC: 33.3 g/dL (ref 30.0–36.0)
MCV: 88.5 fL (ref 78.0–100.0)
Platelets: 204 10*3/uL (ref 150–400)
RBC: 4.17 MIL/uL (ref 3.87–5.11)
RDW: 15.3 % (ref 11.5–15.5)
WBC: 4.7 10*3/uL (ref 4.0–10.5)

## 2013-06-07 LAB — HEPARIN LEVEL (UNFRACTIONATED): Heparin Unfractionated: 0.52 IU/mL (ref 0.30–0.70)

## 2013-06-07 MED ORDER — ISOSORBIDE MONONITRATE ER 30 MG PO TB24
30.0000 mg | ORAL_TABLET | Freq: Every day | ORAL | Status: DC
  Administered 2013-06-07 – 2013-06-09 (×2): 30 mg via ORAL
  Filled 2013-06-07 (×3): qty 1

## 2013-06-07 NOTE — Progress Notes (Signed)
Patient Name: Brooke [name] Date of Encounter: 06/07/2013     Principal Problem:   NSTEMI (non-ST elevated myocardial infarction) Active Problems:   COPD (chronic obstructive pulmonary disease)    SUBJECTIVE  The patient had some recurrent chest tightness and jaw discomfort yesterday which was relieved by sublingual nitroglycerin.  EKG showed no changes.  No further discomfort since yesterday.  CURRENT MEDS . aspirin  324 mg Oral Once  . aspirin EC  81 mg Oral Daily  . atorvastatin  80 mg Oral q1800  . budesonide-formoterol  2 puff Inhalation BID  . ipratropium-albuterol  3 mL Nebulization BID  . isosorbide mononitrate  30 mg Oral Daily  . metoprolol tartrate  12.5 mg Oral BID    OBJECTIVE  Filed Vitals:   06/06/13 1656 06/06/13 2048 06/07/13 0524 06/07/13 0746  BP: 118/57 112/64 110/52   Pulse: 70 73 59   Temp: 97.8 F (36.6 C) 97.6 F (36.4 C) 97.7 F (36.5 C)   TempSrc: Oral Oral Oral   Resp: 18 18 18    Height:      Weight:   141 lb 12.8 oz (64.32 kg)   SpO2: 100% 100% 100% 100%    Intake/Output Summary (Last 24 hours) at 06/07/13 0816 Last data filed at 06/06/13 2052  Gross per 24 hour  Intake    960 ml  Output   1100 ml  Net   -140 ml   Filed Weights   06/05/13 2319 06/06/13 0451 06/07/13 0524  Weight: 139 lb 4.8 oz (63.186 kg) 139 lb 4.8 oz (63.186 kg) 141 lb 12.8 oz (64.32 kg)    PHYSICAL EXAM  General: Pleasant, NAD. Neuro: Alert and oriented X 3. Moves all extremities spontaneously. Psych: Normal affect. HEENT:  Normal  Neck: Supple without bruits or JVD. Lungs:  Resp regular and unlabored, CTA. Heart: RRR no s3, s4, or murmurs. Abdomen: Soft, non-tender, non-distended, BS + x 4.  Extremities: No clubbing, cyanosis or edema. DP/PT/Radials 2+ and equal bilaterally.  Accessory Clinical Findings  CBC  Recent Labs  06/05/13 1950 06/06/13 0313 06/06/13 0600 06/07/13 0557  WBC 10.9* 9.1 7.1 4.7  NEUTROABS  --  7.0  --   --     HGB 12.7 12.4 12.7 12.3  HCT 37.3 36.2 37.8 36.9  MCV 86.5 86.6 86.9 88.5  PLT 219 213 200 431   Basic Metabolic Panel  Recent Labs  06/05/13 1950 06/06/13 0313 06/06/13 0600  NA 132* 138 139  K 4.5 4.1 3.7  CL 97 101 103  CO2 24 24 22   GLUCOSE 112* 113* 105*  BUN 12 8 8   CREATININE 0.52 0.48* 0.49*  CALCIUM 9.0 8.9 8.8  MG  --  2.1  --    Liver Function Tests  Recent Labs  06/06/13 0313  AST 26  ALT 16  ALKPHOS 54  BILITOT 0.3  PROT 6.3  ALBUMIN 3.3*   No results found for this basename: LIPASE, AMYLASE,  in the last 72 hours Cardiac Enzymes  Recent Labs  06/06/13 0105 06/06/13 0600 06/06/13 1140  TROPONINI 2.63* 1.11* 0.83*   BNP No components found with this basename: POCBNP,  D-Dimer No results found for this basename: DDIMER,  in the last 72 hours Hemoglobin A1C  Recent Labs  06/06/13 0313  HGBA1C 5.5   Fasting Lipid Panel  Recent Labs  06/06/13 0600  CHOL 219*  HDL 78  LDLCALC 125*  TRIG 81  CHOLHDL 2.8   Thyroid Function Tests  Recent Labs  06/06/13 0313  TSH 1.241    TELE  Normal sinus rhythm  ECG  Normal sinus rhythm.  No acute changes.  EKG dated 06/06/13  2-D echocardiogram: Study Conclusions  Left ventricle: The cavity size was normal. Systolic function was normal. The estimated ejection fraction was in the range of 50% to 55%. Regional wall motion abnormalities cannot be excluded. Doppler parameters are consistent with abnormal left ventricular relaxation (grade 1 diastolic dysfunction).  Radiology/Studies  Dg Chest 2 View  06/05/2013   CLINICAL DATA:  Shoulder pain and jaw pain  EXAM: CHEST  2 VIEW  COMPARISON:  None.  FINDINGS: Normal cardiac silhouette. Lungs are hyperinflated. There is chronic bronchitic change centrally. No focal consolidation. No pleural fluid. No pneumothorax.  IMPRESSION: Emphysematous change.  No acute findings.   Electronically Signed   By: Suzy Bouchard M.D.   On: 06/05/2013  21:07    ASSESSMENT AND PLAN 1. non-STEMI  2. oxygen-dependent COPD  3. hypercholesterolemia with LDL 125   Plan: Continue aspirin,heparin drip, Lipitor, low-dose beta blocker.  Add Imdur.  Left heart catheterization on Monday. Discussed with patient.    Signed, Darlin Coco MD

## 2013-06-07 NOTE — Progress Notes (Signed)
ANTICOAGULATION CONSULT NOTE - Follow Up Consult  Pharmacy Consult for heparin Indication: chest pain/ACS  Labs:  Recent Labs  06/05/13 1950 06/06/13 0105 06/06/13 0313 06/06/13 0600 06/06/13 1140 06/07/13 0557  HGB 12.7  --  12.4 12.7  --  12.3  HCT 37.3  --  36.2 37.8  --  36.9  PLT 219  --  213 200  --  204  APTT  --   --  56*  --   --   --   LABPROT  --   --  12.7  --   --   --   INR  --   --  0.97  --   --   --   HEPARINUNFRC  --   --  0.29*  --  0.43 0.52  CREATININE 0.52  --  0.48* 0.49*  --   --   TROPONINI 2.78* 2.63*  --  1.11* 0.83*  --     Assessment: 68 yo female on heparin drip for nstemi.  HL is therapeutic, CBC is stable.   Pt undergoing cath on Monday.  Goal of Therapy:  Heparin level 0.3-0.7 units/ml   Plan:  Continue heparin drip at 900 units/hr Daily HL/CBC  Hughes Better, PharmD, BCPS Clinical Pharmacist 06/07/2013 8:54 AM

## 2013-06-08 ENCOUNTER — Encounter (HOSPITAL_COMMUNITY)
Admission: EM | Disposition: A | Discharge status: Home / Self Care | Payer: Self-pay | Source: Home / Self Care | Attending: Cardiology

## 2013-06-08 DIAGNOSIS — I2510 Atherosclerotic heart disease of native coronary artery without angina pectoris: Secondary | ICD-10-CM

## 2013-06-08 DIAGNOSIS — E785 Hyperlipidemia, unspecified: Secondary | ICD-10-CM | POA: Diagnosis present

## 2013-06-08 HISTORY — PX: LEFT HEART CATHETERIZATION WITH CORONARY ANGIOGRAM: SHX5451

## 2013-06-08 LAB — CBC
HCT: 35.2 % — ABNORMAL LOW (ref 36.0–46.0)
Hemoglobin: 11.9 g/dL — ABNORMAL LOW (ref 12.0–15.0)
MCH: 29.9 pg (ref 26.0–34.0)
MCHC: 33.8 g/dL (ref 30.0–36.0)
MCV: 88.4 fL (ref 78.0–100.0)
PLATELETS: 214 10*3/uL (ref 150–400)
RBC: 3.98 MIL/uL (ref 3.87–5.11)
RDW: 15.4 % (ref 11.5–15.5)
WBC: 6.8 10*3/uL (ref 4.0–10.5)

## 2013-06-08 LAB — HEPARIN LEVEL (UNFRACTIONATED): Heparin Unfractionated: 0.46 IU/mL (ref 0.30–0.70)

## 2013-06-08 SURGERY — LEFT HEART CATHETERIZATION WITH CORONARY ANGIOGRAM
Anesthesia: LOCAL

## 2013-06-08 MED ORDER — SODIUM CHLORIDE 0.9 % IJ SOLN: 3.0000 mL | Freq: Two times a day (BID) | INTRAMUSCULAR | Status: DC

## 2013-06-08 MED ORDER — NITROGLYCERIN 0.2 MG/ML ON CALL CATH LAB
INTRAVENOUS | Status: AC
  Filled 2013-06-08: qty 1

## 2013-06-08 MED ORDER — ALUM & MAG HYDROXIDE-SIMETH 200-200-20 MG/5ML PO SUSP
ORAL | Status: AC
  Filled 2013-06-08: qty 30

## 2013-06-08 MED ORDER — LIDOCAINE HCL (PF) 1 % IJ SOLN
INTRAMUSCULAR | Status: AC
Start: 2013-06-08 — End: 2013-06-08
  Filled 2013-06-08: qty 30

## 2013-06-08 MED ORDER — ASPIRIN 81 MG PO CHEW
81.0000 mg | CHEWABLE_TABLET | ORAL | Status: DC
Start: 2013-06-09 — End: 2013-06-08

## 2013-06-08 MED ORDER — DIAZEPAM 5 MG PO TABS
5.0000 mg | ORAL_TABLET | ORAL | Status: AC
  Administered 2013-06-08: 5 mg via ORAL
  Filled 2013-06-08: qty 1

## 2013-06-08 MED ORDER — SODIUM CHLORIDE 0.9 % IV SOLN
1.0000 mL/kg/h | INTRAVENOUS | Status: AC
  Administered 2013-06-08: 1 mL/kg/h via INTRAVENOUS

## 2013-06-08 MED ORDER — SODIUM CHLORIDE 0.9 % IV SOLN: 250.0000 mL | INTRAVENOUS | Status: DC | PRN

## 2013-06-08 MED ORDER — SODIUM CHLORIDE 0.9 % IJ SOLN
3.0000 mL | Freq: Two times a day (BID) | INTRAMUSCULAR | Status: DC
  Administered 2013-06-09: 3 mL via INTRAVENOUS

## 2013-06-08 MED ORDER — SODIUM CHLORIDE 0.9 % IJ SOLN: 3.0000 mL | INTRAMUSCULAR | Status: DC | PRN

## 2013-06-08 MED ORDER — MIDAZOLAM HCL 2 MG/2ML IJ SOLN
INTRAMUSCULAR | Status: AC
  Filled 2013-06-08: qty 2

## 2013-06-08 MED ORDER — HEPARIN (PORCINE) IN NACL 2-0.9 UNIT/ML-% IJ SOLN
INTRAMUSCULAR | Status: AC
  Filled 2013-06-08: qty 1000

## 2013-06-08 MED ORDER — FENTANYL CITRATE 0.05 MG/ML IJ SOLN
INTRAMUSCULAR | Status: AC
  Filled 2013-06-08: qty 2

## 2013-06-08 MED ORDER — VERAPAMIL HCL 2.5 MG/ML IV SOLN
INTRAVENOUS | Status: AC
  Filled 2013-06-08: qty 2

## 2013-06-08 MED ORDER — HEPARIN SODIUM (PORCINE) 1000 UNIT/ML IJ SOLN
INTRAMUSCULAR | Status: AC
  Filled 2013-06-08: qty 1

## 2013-06-08 MED ORDER — ASPIRIN 81 MG PO CHEW
81.0000 mg | CHEWABLE_TABLET | Freq: Once | ORAL | Status: AC
  Administered 2013-06-08: 81 mg via ORAL

## 2013-06-08 NOTE — Progress Notes (Signed)
ANTICOAGULATION CONSULT NOTE - Follow Up Consult  Pharmacy Consult for Heparin Indication: NSTEMI  Allergies  Allergen Reactions  . Codeine Nausea And Vomiting  . Prednisone Other (See Comments)    Reaction:Abnormal behavior; cannot take in pill form but CAN tolerate the injection    Patient Measurements: Height: 5\' 3"  (160 cm) Weight: 141 lb 12.8 oz (64.32 kg) IBW/kg (Calculated) : 52.4 Heparin Dosing Weight: 56kg  Vital Signs: Temp: 98.4 F (36.9 C) (02/02 0520) Temp src: Oral (02/02 0520) BP: 119/74 mmHg (02/02 0520) Pulse Rate: 68 (02/02 0520)  Labs:  Recent Labs  06/05/13 1950 06/06/13 0105  06/06/13 0313 06/06/13 0600 06/06/13 1140 06/07/13 0557 06/08/13 0426  HGB 12.7  --   --  12.4 12.7  --  12.3 11.9*  HCT 37.3  --   --  36.2 37.8  --  36.9 35.2*  PLT 219  --   --  213 200  --  204 214  APTT  --   --   --  56*  --   --   --   --   LABPROT  --   --   --  12.7  --   --   --   --   INR  --   --   --  0.97  --   --   --   --   HEPARINUNFRC  --   --   < > 0.29*  --  0.43 0.52 0.46  CREATININE 0.52  --   --  0.48* 0.49*  --   --   --   TROPONINI 2.78* 2.63*  --   --  1.11* 0.83*  --   --   < > = values in this interval not displayed.  Estimated Creatinine Clearance: 61.6 ml/min (by C-G formula based on Cr of 0.49).   Medications:  Heparin 900 units/hr  Assessment: 67yof on heparin for NSTEMI scheduled for cath today. Heparin level (0.46) remains therapeutic - will continue current rate and follow-up post cath orders. - H/H and Plts stable - No significant bleeding reported  Goal of Therapy:  Heparin level 0.3-0.7 units/ml Monitor platelets by anticoagulation protocol: Yes   Plan:  1. 1. Continue heparin drip 900 units/hr ( ml/hr) 2. Follow-up post cath orders   Earleen Newport [phone] 06/08/2013,9:04 AM

## 2013-06-08 NOTE — Care Management Note (Signed)
    Page 1 of 1   06/09/2013     12:29:14 PM   CARE MANAGEMENT NOTE 06/09/2013  Patient:  Brooke [name], Brooke [name]   Account Number:  [id number]  Date Initiated:  06/08/2013  Documentation initiated by:  GRAVES-BIGELOW,Meliah Appleman  Subjective/Objective Assessment:   Pt admitted for cp increased troponins-Nstemi. Post cath 06-08-13- Mild CAD and plan for medical management.     Action/Plan:   CM will continue to monitor for disposition needs.   Anticipated DC Date:  06/09/2013   Anticipated DC Plan:  [place]  CM consult      Choice offered to / List presented to:             Status of service:  Completed, signed off Medicare Important Message given?   (If response is "NO", the following Medicare IM given date fields will be blank) Date Medicare IM given:   Date Additional Medicare IM given:    Discharge Disposition:  HOME/SELF CARE  Per UR Regulation:  Reviewed for med. necessity/level of care/duration of stay  If discussed at [place] of Stay Meetings, dates discussed:    Comments:  06-09-13 1227 Jacqlyn Krauss, RN,BSN [phone] CM did speak to pt and she needs PCS for assistance around the home. CM did speak to pt in regards that she will have to pay out of pocket for these services. List given to pt. No further needs at this time.

## 2013-06-08 NOTE — Progress Notes (Signed)
UR Completed Uvaldo Rybacki Graves-Bigelow, RN,BSN [phone]  

## 2013-06-08 NOTE — Progress Notes (Signed)
Subjective: Feeling better.  Shoulder and jaw pain resolved.  Objective: Vital signs in last 24 hours: Temp:  [97.8 F (36.6 C)-98.4 F (36.9 C)] 98.4 F (36.9 C) (02/02 0520) Pulse Rate:  [68-75] 68 (02/02 0520) Resp:  [15-16] 15 (02/02 0520) BP: (98-119)/(53-74) 119/74 mmHg (02/02 0520) SpO2:  [99 %-100 %] 99 % (02/02 0520) Last BM Date: 06/05/13  Intake/Output from previous day: 02/01 0701 - 02/02 0700 In: 1200 [P.O.:1200] Out: 2302 [Urine:2300; Stool:2] Intake/Output this shift:    Medications Current Facility-Administered Medications  Medication Dose Route Frequency Provider Last Rate Last Dose  . 0.9 %  sodium chloride infusion  250 mL Intravenous PRN Darlin Coco, MD      . acetaminophen (TYLENOL) tablet 650 mg  650 mg Oral Q4H PRN Jules Husbands, MD   650 mg at 06/07/13 1437  . albuterol (PROVENTIL) (2.5 MG/3ML) 0.083% nebulizer solution 2.5 mg  2.5 mg Inhalation Q6H PRN Jules Husbands, MD   2.5 mg at 06/06/13 0355  . alum & mag hydroxide-simeth (MAALOX/MYLANTA) 200-200-20 MG/5ML suspension 30 mL  30 mL Oral PRN Dorothy Spark, MD   30 mL at 06/06/13 1538  . aspirin EC tablet 81 mg  81 mg Oral Daily Jules Husbands, MD   81 mg at 06/07/13 0915  . atorvastatin (LIPITOR) tablet 80 mg  80 mg Oral q1800 Jules Husbands, MD   80 mg at 06/07/13 0915  . budesonide-formoterol (SYMBICORT) 160-4.5 MCG/ACT inhaler 2 puff  2 puff Inhalation BID Jules Husbands, MD   2 puff at 06/07/13 2054  . diazepam (VALIUM) tablet 5 mg  5 mg Oral On Call Sherren Mocha, MD      . heparin ADULT infusion 100 units/mL (25000 units/250 mL)  900 Units/hr Intravenous Continuous Rogue Bussing, [facility] 9 mL/hr at 06/06/13 0551 900 Units/hr at 06/06/13 0551  . ipratropium-albuterol (DUONEB) 0.5-2.5 (3) MG/3ML nebulizer solution 3 mL  3 mL Nebulization BID Jules Husbands, MD   3 mL at 06/08/13 0840  . isosorbide mononitrate (IMDUR) 24 hr tablet 30 mg  30 mg Oral Daily Darlin Coco, MD   30 mg at 06/07/13 0915    . metoprolol tartrate (LOPRESSOR) tablet 12.5 mg  12.5 mg Oral BID Jules Husbands, MD   12.5 mg at 06/06/13 0108  . nitroGLYCERIN (NITROSTAT) SL tablet 0.4 mg  0.4 mg Sublingual Q5 Min x 3 PRN Jules Husbands, MD   0.4 mg at 06/06/13 1531  . ondansetron (ZOFRAN) injection 4 mg  4 mg Intravenous Q6H PRN Jules Husbands, MD      . sodium chloride 0.9 % injection 3 mL  3 mL Intravenous Q12H Darlin Coco, MD      . sodium chloride 0.9 % injection 3 mL  3 mL Intravenous PRN Darlin Coco, MD        PE: General appearance: alert, cooperative and no distress Lungs: clear to auscultation bilaterally Heart: regular rate and rhythm, S1, S2 normal, no murmur, click, rub or gallop Extremities: No LEE Pulses: 2+ and symmetric Skin: Warm and dry Neurologic: Grossly normal  Lab Results:   Recent Labs  06/06/13 0600 06/07/13 0557 06/08/13 0426  WBC 7.1 4.7 6.8  HGB 12.7 12.3 11.9*  HCT 37.8 36.9 35.2*  PLT 200 204 214   BMET  Recent Labs  06/05/13 1950 06/06/13 0313 06/06/13 0600  NA 132* 138 139  K 4.5 4.1 3.7  CL 97 101 103  CO2 24 24 22   GLUCOSE 112* 113* 105*  BUN 12 8 8   CREATININE 0.52 0.48* 0.49*  CALCIUM 9.0 8.9 8.8   PT/INR  Recent Labs  06/06/13 0313  LABPROT 12.7  INR 0.97   Cholesterol  Recent Labs  06/06/13 0600  CHOL 219*   Lipid Panel     Component Value Date/Time   CHOL 219* 06/06/2013 0600   TRIG 81 06/06/2013 0600   HDL 78 06/06/2013 0600   CHOLHDL 2.8 06/06/2013 0600   VLDL 16 06/06/2013 0600   LDLCALC 125* 06/06/2013 0600   Cardiac Panel (last 3 results)  Recent Labs  06/06/13 0105 06/06/13 0600 06/06/13 1140  TROPONINI 2.63* 1.11* 0.83*    Assessment/Plan   Principal Problem:   NSTEMI (non-ST elevated myocardial infarction) Active Problems:   COPD (chronic obstructive pulmonary disease)   HLD (hyperlipidemia)  Plan:  Troponin peak 2.78.  Left heart cath today at ~1330hrs.  On heparin, lopressor, lipitor.  BP and HR stable. Has  primary [place] who follows her COPD no lung doctor Where's oxygen at night.  If she needs something Besides stent will need pulmonary consult and PFT;s     LOS: 3 days   Jenkins Rouge

## 2013-06-08 NOTE — Progress Notes (Signed)
Pt TR band removed and pressure dressing applied. Prior to leaving room TR band noted to be perfusely bleeding. Manual pressure applied and arm elevated above head. Manual blood pressure cuff applied and inflated to 160. Smokey from cath lab called and replaced TR band with 14cc at 1440. VSS. Will continue to monitor patient closely. Dorna Bloom, RN

## 2013-06-08 NOTE — CV Procedure (Signed)
CARDIAC CATHETERIZATION REPORT   Procedures performed:  1. Left heart catheterization  2. Selective coronary angiography  3. Left ventriculography   Reason for procedure:  Acute non ST segment elevation myocardial infarction   Procedure performed by: Sanda Klein, MD, [facility]  Complications: none   Estimated blood loss: less than 5 mL   History:  Brooke [name] is a 68 yo woman with PMH of prior tobacco use (quit tobacco 5 years ago) COPD one prn 2L home oxygen who became stressed after misplacing her car keys at the grocery store this afternoon around 14:30 leading to significant walking around and ultimately some jaw and right shoulder pain with burning character of pain. In the last three weeks she is also noted to have new GERD as well as some musculoskeletal pain that she has seen her PCP for. She notes some diarrhea. She called EMS because of her symptoms but placing on her oxygen improved the pain and she's been asymptomatic in the ER. She uses her oxygen at night. She has no sick contacts, no fever/chills/cough. Family history of MI at earlier age. In the ER she was noted to have troponin of 2.78. Aspirin and heparin started/given in the ER.    Consent: The risks, benefits, and details of the procedure were explained to the patient. Risks including death, MI, stroke, bleeding, limb ischemia, renal failure and allergy were described and accepted by the patient. Informed written consent was obtained prior to proceeding.  Technique: The patient was brought to the cardiac catheterization laboratory in the fasting state. He was prepped and draped in the usual sterile fashion. Local anesthesia with 1% lidocaine was administered to the right wrist area. Using the modified Seldinger technique a 5 French right radial artery sheath was introduced without difficulty. Under fluoroscopic guidance, using 5 [place] JL4, JR and angled pigtail catheters, selective cannulation of the left coronary artery, right  coronary artery and left ventricle were respectively performed. Several coronary angiograms in a variety of projections were recorded, as well as a left ventriculogram in the RAO projection. Left ventricular pressure and a pull back to the aorta were recorded. No immediate complications occurred. At the end of the procedure, all catheters were removed. After the procedure, hemostasis will be achieved with manual pressure.  Contrast used: 85 mL Omnipaque  Angiographic Findings:  1. The left main coronary artery is free of significant atherosclerosis and bifurcates in the usual fashion into the left anterior descending artery and left circumflex coronary artery.  2. The left anterior descending artery is a large vessel that reaches well beyond the apex and generates one major diagonal branch. There is evidence of minimal luminal irregularities and no calcification. No hemodynamically meaningful stenoses are seen. There is a <30% ostial lesion of the first diagonal artery.  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 3. The left circumflex coronary artery is a large-size vessel non dominant vessel that generates one major bifurcating oblique marginal artery. There is evidence of minimal luminal irregularities and no calcification. No hemodynamically meaningful stenoses are seen. 4. The right coronary artery is a medium-size dominant vessel that generates a small posterior lateral ventricular system as well as the relatively short PDA. There is evidence of mild luminal irregularities and no calcification. A 30% relatively smooth lesion is seen in the mid RCA. No other hemodynamically meaningful stenoses are seen.  5. The left ventricle is normal in size.  The left ventricle systolic function is normal with an estimated ejection fraction of 60%. Regional wall motion abnormalities are not seen. No left  ventricular thrombus is seen. There is no mitral insufficiency. The ascending aorta appears normal. There is no aortic valve stenosis by pullback. The left ventricular end-diastolic pressure is 10 mm Hg.   The patient described mild chest and left shoulder discomfort during the procedure, while cannulating the RCA. The RCA was widely patent, so I went back to reimage the LCA - no sign of coronary spasm.   IMPRESSIONS:  Minimal coronary atherosclerosis. Intramyocardial segment of the mid LAD artery No obvious culprit lesion for NSTEMI. Normal LV wall motion and overall EF.   RECOMMENDATION:  Medical therapy of NSTEMI and mild CAD. Consider empirical therapy for coronary spasm, although this was not documented during this procedure.    Sanda Klein, MD, [facility] [phone] office [phone] pager

## 2013-06-09 ENCOUNTER — Encounter (HOSPITAL_COMMUNITY): Payer: Self-pay | Admitting: Physician Assistant

## 2013-06-09 ENCOUNTER — Telehealth: Payer: Self-pay | Admitting: Cardiology

## 2013-06-09 DIAGNOSIS — K219 Gastro-esophageal reflux disease without esophagitis: Secondary | ICD-10-CM | POA: Diagnosis present

## 2013-06-09 DIAGNOSIS — Z87891 Personal history of nicotine dependence: Secondary | ICD-10-CM

## 2013-06-09 LAB — CBC
HCT: 33.5 % — ABNORMAL LOW (ref 36.0–46.0)
HEMOGLOBIN: 11.2 g/dL — AB (ref 12.0–15.0)
MCH: 29.7 pg (ref 26.0–34.0)
MCHC: 33.4 g/dL (ref 30.0–36.0)
MCV: 88.9 fL (ref 78.0–100.0)
Platelets: 177 10*3/uL (ref 150–400)
RBC: 3.77 MIL/uL — AB (ref 3.87–5.11)
RDW: 15.4 % (ref 11.5–15.5)
WBC: 5.1 10*3/uL (ref 4.0–10.5)

## 2013-06-09 MED ORDER — ASPIRIN 81 MG PO TABS: 81.0000 mg | ORAL_TABLET | Freq: Every day | ORAL | Status: DC

## 2013-06-09 MED ORDER — AMLODIPINE BESYLATE 2.5 MG PO TABS: 2.5000 mg | ORAL_TABLET | Freq: Every day | ORAL | Status: DC

## 2013-06-09 MED ORDER — ATORVASTATIN CALCIUM 40 MG PO TABS: 40.0000 mg | ORAL_TABLET | Freq: Every evening | ORAL | Status: DC

## 2013-06-09 MED ORDER — NITROGLYCERIN 0.4 MG SL SUBL: 0.4000 mg | SUBLINGUAL_TABLET | SUBLINGUAL | Status: DC | PRN

## 2013-06-09 NOTE — Progress Notes (Signed)
Patient: Brooke [name] / Admit Date: 06/05/2013 / Date of Encounter: 06/09/2013, 10:50 AM   Subjective  No CP or SOB. R radial site bled yesterday resolved with pressure, but feels good today. Headache with Imdur yesterday so declined it this morning  Objective   Telemetry: NSR, occ PAC  Physical Exam: Blood pressure 109/67, pulse 77, temperature 98.5 F (36.9 C), temperature source Oral, resp. rate 18, height 5\' 3"  (1.6 m), weight 148 lb 2.4 oz (67.2 kg), SpO2 99.00%. General: Well developed, well nourished WF in no acute distress Head: Normocephalic, atraumatic, sclera non-icteric, no xanthomas, nares are without discharge. Neck: JVP not elevated. Lungs: Mildly diminished BS throughout without wheezes, rales, or rhonchi. Breathing is unlabored. Heart: RRR S1 S2 without murmurs, rubs, or gallops.  Abdomen: Soft, non-tender, non-distended with normoactive bowel sounds. No rebound/guarding. Extremities: No clubbing or cyanosis. No edema. Distal pedal pulses are 2+ and equal bilaterally. R radial site with local extensive ecchymosis into distal 1/4 forearm, soft, nontender, good mobility, pulse in tact Neuro: Alert and oriented X 3. Moves all extremities spontaneously. Psych:  Responds to questions appropriately with a normal affect.   Intake/Output Summary (Last 24 hours) at 06/09/13 1050 Last data filed at 06/09/13 1041  Gross per 24 hour  Intake      3 ml  Output      0 ml  Net      3 ml    Inpatient Medications:  . aspirin EC  81 mg Oral Daily  . atorvastatin  80 mg Oral q1800  . budesonide-formoterol  2 puff Inhalation BID  . ipratropium-albuterol  3 mL Nebulization BID  . isosorbide mononitrate  30 mg Oral Daily  . metoprolol tartrate  12.5 mg Oral BID  . sodium chloride  3 mL Intravenous Q12H   Infusions:    Labs: No results found for this basename: NA, K, CL, CO2, GLUCOSE, BUN, CREATININE, CALCIUM, MG, PHOS,  in the last 72 hours No results found for this  basename: AST, ALT, ALKPHOS, BILITOT, PROT, ALBUMIN,  in the last 72 hours  Recent Labs  06/08/13 0426 06/09/13 0311  WBC 6.8 5.1  HGB 11.9* 11.2*  HCT 35.2* 33.5*  MCV 88.4 88.9  PLT 214 177    Recent Labs  06/06/13 1140  TROPONINI 0.83*     Radiology/Studies:  Dg Chest 2 View 06/05/2013   CLINICAL DATA:  Shoulder pain and jaw pain  EXAM: CHEST  2 VIEW  COMPARISON:  None.  FINDINGS: Normal cardiac silhouette. Lungs are hyperinflated. There is chronic bronchitic change centrally. No focal consolidation. No pleural fluid. No pneumothorax.  IMPRESSION: Emphysematous change.  No acute findings.   Electronically Signed   By: Suzy Bouchard M.D.   On: 06/05/2013 21:07     Assessment and Plan  1. NSTEMI with minimal CAD by cath 06/08/13, intramyocardial segment of mLAD, no obvious culprit for NSTEMI 2. COPD on home O2 PRN 3. Former tobacco abuse 4. GERD  Continue ASA. Per Dr. Johnsie Cancel, no Plavix. No recent travel, bedrest, surgeries, trauma, or LEE to suggest DVT or PE as alternative dx. ?Possible spasm although not seen on cath. DC Imdur due to headache and initiate low dose amlodipine. BP chronically soft. DC metoprolol due to soft BPs and no CAD. Continue statin at lower dose. Assess ambulatory O2 (only on this PRN). CM consult for home health needs per pt request. Observe radial site closely at home and f/u next week. Possible DC today.  Signed, Lisbeth Renshaw  Dunn PA-C   No need for plavix as there is no plaque on angio.  Consider outpatient f/u pulmonary given extent of her  COPD  Continue home oxygen    Jenkins Rouge

## 2013-06-09 NOTE — Discharge Summary (Addendum)
Discharge Summary   Patient ID: Brooke [name] MRN: [id number], DOB/AGE: 09/28/1945 68 y.o. Admit date: 06/05/2013 D/C date:     06/09/2013  Primary Care Provider: Lynne Logan, MD Primary Cardiologist: New to [facility] this admission  Primary Discharge Diagnoses:  1. NSTEMI with minimal CAD by cath 06/08/13, intramyocardial segment of mLAD, no obvious culprit for NSTEMI, ? Coronary vasospasm vs type II NSTEMI from being off oxygen 2. COPD on home O2 for chronic respiratory failure on ambulation 3. Former tobacco abuse  4. Hyperlipidemia - started on statin  Secondary Discharge Diagnoses:  1. GERD  Hospital Course: Brooke [name] is a 68 y/o F with history of prior tobacco abuse (quit 5 yrs ago), COPD on home O2 PRN, GERD who presented to [facility] following an episode of chest discomfort. She became stressed after misplacing her car keys at the grocery store this afternoon around 14:30 leading to significant walking around and ultimately some jaw and right shoulder pain with burning character of pain. She had not brought her oxygen tank to the grocery store as she usually does. In the last three weeks she is also noted to have new GERD as well as some musculoskeletal pain that she has seen her PCP for. She called EMS because of her symptoms and placing on her oxygen improved the pain. In the ER she was noted to have troponin of 2.78. This was its peak. She was given aspirin and started on heparin. EKG was without acute changes. CXR showed emphysema without acute change. She underwent cardiac cath on 06/08/13 showing: Minimal coronary atherosclerosis.  Intramyocardial segment of the mid LAD artery  No obvious culprit lesion for NSTEMI.  Normal LV wall motion and overall EF. There was consideration given to the dx of coronary vasospasm (vs type 2 NSTEMI from being off oxygen during event). 2D Echo showed EF 50-55%, RWMA could not be excluded, grade 1 diastolic dysfunction. She  ambulated with nursing staff and cardiac rehab.  She was initially on metoprolol during this admission but this was later discontinued due to hypotension. She did not tolerate Imdur due to headache. We will try her on low dose amlodipine starting tomorrow (BP somewhat soft at times but in general has run 110-130s). TSH wnl. She had some bleeding from her radial site post cath which resolved with pressure with residual ecchymosis. She was instructed to monitor this at home. Oxygen needs were assessed - she was 97% on RA, dropped to 88% on ambulation (came up with 2L), thus was advised to start wearing her oxygen with ambulation. Dr. Johnsie Cancel has seen and examined the patient today and feels she is stable for discharge.  She was also started on statin this admission due to LDL 125. Consider f/u LFTs/lipids in 6 weeks.   Discharge Vitals: Blood pressure 109/67, pulse 76, temperature 98.5 F (36.9 C), temperature source Oral, resp. rate 18, height 5\' 3"  (1.6 m), weight 148 lb 2.4 oz (67.2 kg), SpO2 100.00%. [place]  Labs: Lab Results  Component Value Date   WBC 5.1 06/09/2013   HGB 11.2* 06/09/2013   HCT 33.5* 06/09/2013   MCV 88.9 06/09/2013   PLT 177 06/09/2013     Recent Labs Lab 06/06/13 0313 06/06/13 0600  NA 138 139  K 4.1 3.7  CL 101 103  CO2 24 22  BUN 8 8  CREATININE 0.48* 0.49*  CALCIUM 8.9 8.8  PROT 6.3  --   BILITOT 0.3  --   ALKPHOS 54  --  ALT 16  --   AST 26  --   GLUCOSE 113* 105*    Lab Results  Component Value Date   CHOL 219* 06/06/2013   HDL 78 06/06/2013   LDLCALC 125* 06/06/2013   TRIG 81 06/06/2013     Diagnostic Studies/Procedures   Dg Chest 2 View 06/05/2013   CLINICAL DATA:  Shoulder pain and jaw pain  EXAM: CHEST  2 VIEW  COMPARISON:  None.  FINDINGS: Normal cardiac silhouette. Lungs are hyperinflated. There is chronic bronchitic change centrally. No focal consolidation. No pleural fluid. No pneumothorax.  IMPRESSION: Emphysematous change.  No acute findings.    Electronically Signed   By: Suzy Bouchard M.D.   On: 06/05/2013 21:07   CV Procedure 06/08/13 Procedures performed:  1. Left heart catheterization  2. Selective coronary angiography  3. Left ventriculography  Reason for procedure:  Acute non ST segment elevation myocardial infarction  Procedure performed by: Sanda Klein, MD, [facility]  Complications: none  Estimated blood loss: less than 5 mL  History: Brooke [name] is a 68 yo woman with PMH of prior tobacco use (quit tobacco 5 years ago) COPD one prn 2L home oxygen who became stressed after misplacing her car keys at the grocery store this afternoon around 14:30 leading to significant walking around and ultimately some jaw and right shoulder pain with burning character of pain. In the last three weeks she is also noted to have new GERD as well as some musculoskeletal pain that she has seen her PCP for. She notes some diarrhea. She called EMS because of her symptoms but placing on her oxygen improved the pain and she's been asymptomatic in the ER. She uses her oxygen at night. She has no sick contacts, no fever/chills/cough. Family history of MI at earlier age. In the ER she was noted to have troponin of 2.78. Aspirin and heparin started/given in the ER.  Consent: The risks, benefits, and details of the procedure were explained to the patient. Risks including death, MI, stroke, bleeding, limb ischemia, renal failure and allergy were described and accepted by the patient. Informed written consent was obtained prior to proceeding.  Technique: The patient was brought to the cardiac catheterization laboratory in the fasting state. He was prepped and draped in the usual sterile fashion. Local anesthesia with 1% lidocaine was administered to the right wrist area. Using the modified Seldinger technique a 5 French right radial artery sheath was introduced without difficulty. Under fluoroscopic guidance, using 5 [place] JL4, JR and angled pigtail catheters,  selective cannulation of the left coronary artery, right coronary artery and left ventricle were respectively performed. Several coronary angiograms in a variety of projections were recorded, as well as a left ventriculogram in the RAO projection. Left ventricular pressure and a pull back to the aorta were recorded. No immediate complications occurred. At the end of the procedure, all catheters were removed. After the procedure, hemostasis will be achieved with manual pressure.  Contrast used: 85 mL Omnipaque  Angiographic Findings:  1. The left main coronary artery is free of significant atherosclerosis and bifurcates in the usual fashion into the left anterior descending artery and left circumflex coronary artery.  2. The left anterior descending artery is a large vessel that reaches well beyond the apex and generates one major diagonal branch. There is evidence of minimal luminal irregularities and no calcification. No hemodynamically meaningful stenoses are seen. There is a <30% ostial lesion of the first diagonal artery.  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  3. The left circumflex coronary artery is a large-size vessel non dominant vessel that generates one major bifurcating oblique marginal artery. There is evidence of minimal luminal irregularities and no calcification. No hemodynamically meaningful stenoses are seen.  4. The right coronary artery is a medium-size dominant vessel that generates a small posterior lateral ventricular system as well as the relatively short PDA. There is evidence of mild luminal irregularities and no calcification. A 30% relatively smooth lesion is seen in the mid RCA. No other hemodynamically meaningful stenoses are seen.  5. The left ventricle is normal in size. The left ventricle systolic function is normal with an estimated ejection fraction of 60%. Regional  wall motion abnormalities are not seen. No left ventricular thrombus is seen. There is no mitral insufficiency. The ascending aorta appears normal. There is no aortic valve stenosis by pullback. The left ventricular end-diastolic pressure is 10 mm Hg.  The patient described mild chest and left shoulder discomfort during the procedure, while cannulating the RCA. The RCA was widely patent, so I went back to reimage the LCA - no sign of coronary spasm.   IMPRESSIONS:  Minimal coronary atherosclerosis.  Intramyocardial segment of the mid LAD artery  No obvious culprit lesion for NSTEMI.  Normal LV wall motion and overall EF.  RECOMMENDATION:  Medical therapy of NSTEMI and mild CAD. Consider empirical therapy for coronary spasm, although this was not documented during this procedure.    Discharge Medications     Medication List         albuterol 108 (90 BASE) MCG/ACT inhaler  Commonly known as:  PROVENTIL HFA;VENTOLIN HFA  Inhale 2 puffs into the lungs every 6 (six) hours as needed. For wheezing/shortness of breath     amLODipine 2.5 MG tablet  Commonly known as:  NORVASC  Take 1 tablet (2.5 mg total) by mouth daily.  Start taking on:  06/10/2013     aspirin 81 MG tablet  Take 1 tablet (81 mg total) by mouth daily.     atorvastatin 40 MG tablet  Commonly known as:  LIPITOR  Take 1 tablet (40 mg total) by mouth every evening.     budesonide-formoterol 160-4.5 MCG/ACT inhaler  Commonly known as:  SYMBICORT  Inhale 2 puffs into the lungs 2 (two) times daily.     ipratropium-albuterol 0.5-2.5 (3) MG/3ML Soln  Commonly known as:  DUONEB  Take 3 mLs by nebulization 2 (two) times daily.     nitroGLYCERIN 0.4 MG SL tablet  Commonly known as:  NITROSTAT  Place 1 tablet (0.4 mg total) under the tongue every 5 (five) minutes as needed for chest pain (up to 3 doses).        Disposition   The patient will be discharged in stable condition to home. Discharge Orders   Future  Appointments Provider Department Dept Phone   06/16/2013 8:30 AM Liliane Shi, PA-C [facility] [phone]   Future Orders Complete By Expires   Diet - low sodium heart healthy  As directed    Increase activity slowly  As directed    Scheduling Instructions:     No driving for 1 week. No lifting over 10 lbs for 2 weeks. No sexual activity for 2 weeks. Keep procedure site clean & dry. If you notice increased pain, swelling, bruising, bleeding or pus, call/return!  You may shower, but no soaking  baths/hot tubs/pools for 1 week.  You should start wearing your oxygen when you walk or exert yourself.     Follow-up Information   Follow up with Richardson Dopp, PA-C. ([facility] - 06/16/13 at 8:30am)    Specialty:  Physician Assistant   Contact information:   [address] [address] [phone]         Duration of Discharge Encounter: Greater than 30 minutes including physician and PA time.  Signed, Lisbeth Renshaw Dunn PA-C 06/09/2013, 1:07 PM

## 2013-06-09 NOTE — Progress Notes (Signed)
CARDIAC REHAB PHASE I   PRE:  Rate/Rhythm: 80SR  BP:  Supine: 102/70  Sitting:   Standing:    SaO2: 98-99%2L  MODE:  Ambulation: 300 ft   POST:  Rate/Rhythm: 88  BP:  Supine:   Sitting: 96/70,   98/70  Standing:    SaO2: 98%2L hall and room [phone] Pt has only gotten to [facility] as activity. Walked 300 ft on 2L with minimal asst with slow pace. Was not dizzy at beginning of walk but c/o lightheadedness at end of walk and later when lying in bed. BPs as documented. No c/o CP. Education completed with pt. Gave modified ex ed as she is limited by pulmonary status. Has home oxygen and mainly wears at night. Discussed CRP 2 but pt declined due to not liking to be around crowds with pulmonary status as it is. Pt lives alone and would like to know what is available after discharge. Needs to see case manager.    Graylon Good, RN BSN  06/09/2013 10:21 AM

## 2013-06-09 NOTE — Telephone Encounter (Signed)
New message    TCM appt with Richardson Dopp on 2/10. Per Eugenia Mcalpine .

## 2013-06-10 NOTE — Telephone Encounter (Signed)
D/C yesterday from cath.  States she is really just waking up so she has only taken her inhalers.  States she had a good night.  Cath site was her (R) arm.  Still has pressure dressing on.  States her arm is bruised but no redness noted. Advised she may take pressure dressing off today.  No c/o of CP.  Does have her NTG to take if necessary.  States she has all of her medications and will start taking her Amlopidine this AM.  Is trying to walk some in house and up 7 steps but has restraints due to her O2.  States she lives alone but does have family near her residence.  Advised she needs to have someone in house when she first takes a shower.  States she is only going to take a "spounge" bath today.  Has an app w/Scott Weaver,PA on 2/10 at 8:30 but states she needs a later appointment if possible.  Changed her app to 11:30 on 2/10.  Since she is a new patient to our practice I gave her instructions to our facility and our phone number.  All questions answered.  Will call if has any further questions or concerns.

## 2013-06-12 ENCOUNTER — Encounter: Payer: Self-pay | Admitting: *Deleted

## 2013-06-16 ENCOUNTER — Encounter: Payer: Self-pay | Admitting: Physician Assistant

## 2013-06-16 ENCOUNTER — Ambulatory Visit (INDEPENDENT_AMBULATORY_CARE_PROVIDER_SITE_OTHER): Payer: Medicare Other | Admitting: Physician Assistant

## 2013-06-16 ENCOUNTER — Encounter: Payer: Medicare Other | Admitting: Physician Assistant

## 2013-06-16 VITALS — BP 120/76 | HR 67 | Ht 63.25 in | Wt 139.8 lb

## 2013-06-16 DIAGNOSIS — I2510 Atherosclerotic heart disease of native coronary artery without angina pectoris: Secondary | ICD-10-CM

## 2013-06-16 DIAGNOSIS — J449 Chronic obstructive pulmonary disease, unspecified: Secondary | ICD-10-CM

## 2013-06-16 DIAGNOSIS — E785 Hyperlipidemia, unspecified: Secondary | ICD-10-CM

## 2013-06-16 DIAGNOSIS — I252 Old myocardial infarction: Secondary | ICD-10-CM

## 2013-06-16 NOTE — Patient Instructions (Signed)
LAB WORK TO BE DONE IN 6 WEEKS FOR FASTING LIPID AND LIVER PANEL  PLEASE FOLLOW UP WITH DR. Mare Ferrari IN 2 MONTHS  MAKE SURE TO FOLLOW UP WITH YOUR PRIMARY CARE PHYSICIAN ABOUT COPD

## 2013-06-16 NOTE — Progress Notes (Signed)
[address] [place], [place]  [address] Phone: [phone] Fax:  [phone]  Date:  06/16/2013   ID:  Brooke [name], Brooke [name] 07-26-1945, MRN [id number]  PCP:  Lynne Logan, MD  Cardiologist:  Dr. Darlin Coco     History of Present Illness: Brooke [name] is a 68 y.o. female with a hx of COPD, HL and GERD.  She was admitted 1/30-2/3 with a NSTEMI.  She presented to the ED with chest, jaw and shoulder pain.  Initial troponin was elevated.  LHC demonstrated minimal CAD. Etiology of non-STEMI was thought to be related to either vasospasm or possibly demand ischemia from being off of oxygen therapy (COPD). Beta blocker was discontinued secondary to hypotension. Isosorbide was discontinued secondary to headache. Low-dose amlodipine was added at discharge.    LHC (06/09/11): Ostial D1 <30%, mid LAD with long segment of myocardial bridging with very mild systolic compression (likely of little clinical significance), mid RCA 30%, EF 60%, LVEDP 10. Echo (06/06/13) is: EF 50-55%, WMA cannot be excluded, grade 1 diastolic dysfunction.  She denies any further chest pain. She has chronic dyspnea without change. She denies orthopnea, PND or edema. She denies syncope. She has had some mild epistaxis. Otherwise she denies any bleeding problems.  Recent Labs: 06/06/2013: ALT 16; Creatinine 0.49*; HDL Cholesterol 78; LDL (calc) 125*; Potassium 3.7; Pro B Natriuretic peptide (BNP) 994.7*; TSH 1.241  06/09/2013: Hemoglobin 11.2*   Wt Readings from Last 3 Encounters:  06/16/13 139 lb 12.8 oz (63.413 kg)  06/09/13 148 lb 2.4 oz (67.2 kg)  06/09/13 148 lb 2.4 oz (67.2 kg)     Past Medical History  Diagnosis Date  . COPD (chronic obstructive pulmonary disease)     a. Home O2.  . NSTEMI (non-ST elevated myocardial infarction)     a. 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 Coronary vasospasm  . GERD (gastroesophageal  reflux disease)   . Former tobacco use   . Hyperlipidemia   . Chronic respiratory failure     a. on home O2.    Current Outpatient Prescriptions  Medication Sig Dispense Refill  . albuterol (PROVENTIL HFA;VENTOLIN HFA) 108 (90 BASE) MCG/ACT inhaler Inhale 2 puffs into the lungs every 6 (six) hours as needed. For wheezing/shortness of breath      . amLODipine (NORVASC) 2.5 MG tablet Take 1 tablet (2.5 mg total) by mouth daily.  30 tablet  6  . aspirin 81 MG tablet Take 1 tablet (81 mg total) by mouth daily.      Marland Kitchen atorvastatin (LIPITOR) 40 MG tablet Take 1 tablet (40 mg total) by mouth every evening.  30 tablet  6  . budesonide-formoterol (SYMBICORT) 160-4.5 MCG/ACT inhaler Inhale 2 puffs into the lungs 2 (two) times daily.      Marland Kitchen ipratropium-albuterol (DUONEB) 0.5-2.5 (3) MG/3ML SOLN Take 3 mLs by nebulization 2 (two) times daily.      . nitroGLYCERIN (NITROSTAT) 0.4 MG SL tablet Place 1 tablet (0.4 mg total) under the tongue every 5 (five) minutes as needed for chest pain (up to 3 doses).  25 tablet  3   No current facility-administered medications for this visit.    Allergies:   Codeine; Imdur; and Prednisone   Social History:  The patient  reports that she has quit smoking. She has never used smokeless tobacco. She reports that she drinks alcohol. She reports that she does not use illicit drugs.  Family History:  The patient's family history includes Cancer in her father.   ROS:  Please see the history of present illness.   She has a nonproductive cough.   All other systems reviewed and negative.   PHYSICAL EXAM: VS:  BP 120/76  Pulse 67  Ht 5' 3.25" (1.607 m)  Wt 139 lb 12.8 oz (63.413 kg)  BMI 24.56 kg/m2 Well nourished, well developed, in no acute distress HEENT: normal Neck: no JVD Cardiac:  normal S1, S2; RRR; no murmur Lungs:  Decreased breath sounds bilaterally, no wheezing, rhonchi or rales Abd: soft, nontender, no hepatomegaly Ext: no edemaright wrist without  hematoma or mass  Skin: warm and dry Neuro:  CNs 2-12 intact, no focal abnormalities noted  EKG:  NSR, HR 67, normal axis, no ST changes     ASSESSMENT AND PLAN:  1. Non-STEMI: Etiology unclear. Possible etiologies include coronary vasospasm versus demand ischemia from hypoxia. Continue current therapy which includes amlodipine, aspirin, statin.  She is not interested in cardiac rehabilitation. 2. CAD: Minimal by cardiac catheterization. Continue aspirin and statin. 3. Hyperlipidemia: Continue statin. Follow up lipids and LFTs in 6 weeks. 4. COPD: Follow up with primary care. 5. Disposition: Follow up with Dr. Mare Ferrari in 2 months  Signed, Richardson Dopp, PA-C  06/16/2013 11:27 AM

## 2013-07-08 DIAGNOSIS — J449 Chronic obstructive pulmonary disease, unspecified: Secondary | ICD-10-CM

## 2013-07-08 DIAGNOSIS — E785 Hyperlipidemia, unspecified: Secondary | ICD-10-CM

## 2013-07-08 DIAGNOSIS — J4489 Other specified chronic obstructive pulmonary disease: Secondary | ICD-10-CM

## 2013-08-07 ENCOUNTER — Telehealth: Payer: Self-pay | Admitting: *Deleted

## 2013-08-07 ENCOUNTER — Ambulatory Visit (INDEPENDENT_AMBULATORY_CARE_PROVIDER_SITE_OTHER): Payer: Medicare Other | Admitting: *Deleted

## 2013-08-07 DIAGNOSIS — E785 Hyperlipidemia, unspecified: Secondary | ICD-10-CM

## 2013-08-07 DIAGNOSIS — I214 Non-ST elevation (NSTEMI) myocardial infarction: Secondary | ICD-10-CM

## 2013-08-07 DIAGNOSIS — J449 Chronic obstructive pulmonary disease, unspecified: Secondary | ICD-10-CM

## 2013-08-07 LAB — HEPATIC FUNCTION PANEL
ALK PHOS: 65 U/L (ref 39–117)
ALT: 31 U/L (ref 0–35)
AST: 21 U/L (ref 0–37)
Albumin: 4 g/dL (ref 3.5–5.2)
BILIRUBIN DIRECT: 0.1 mg/dL (ref 0.0–0.3)
Indirect Bilirubin: 0.2 mg/dL (ref 0.2–1.2)
Total Bilirubin: 0.3 mg/dL (ref 0.2–1.2)
Total Protein: 6.2 g/dL (ref 6.0–8.3)

## 2013-08-07 LAB — LIPID PANEL
Cholesterol: 173 mg/dL (ref 0–200)
HDL: 75 mg/dL (ref >39)
LDL Cholesterol: 88 mg/dL (ref 0–99)
Total CHOL/HDL Ratio: 2.3 Ratio
Triglycerides: 51 mg/dL (ref <150)
VLDL: 10 mg/dL (ref 0–40)

## 2013-08-07 NOTE — Telephone Encounter (Signed)
lmom labs normal if any questions call office

## 2013-08-07 NOTE — Telephone Encounter (Signed)
Message copied by Claude Manges on Fri Aug 07, 2013  1:54 PM ------      Message from: [place], [place] T      Created: Fri Aug 07, 2013 12:54 PM       Lipids at goal.      LFTs ok.      Continue with current treatment plan.      Richardson Dopp, PA-C        08/07/2013 12:54 PM ------

## 2013-08-21 ENCOUNTER — Ambulatory Visit: Payer: Medicare Other | Admitting: Cardiology

## 2013-08-21 ENCOUNTER — Encounter: Payer: Self-pay | Admitting: Cardiology

## 2013-08-21 ENCOUNTER — Ambulatory Visit (INDEPENDENT_AMBULATORY_CARE_PROVIDER_SITE_OTHER): Payer: Medicare Other | Admitting: Cardiology

## 2013-08-21 VITALS — BP 120/76 | HR 62 | Ht 63.25 in | Wt 136.0 lb

## 2013-08-21 DIAGNOSIS — I214 Non-ST elevation (NSTEMI) myocardial infarction: Secondary | ICD-10-CM

## 2013-08-21 DIAGNOSIS — I2510 Atherosclerotic heart disease of native coronary artery without angina pectoris: Secondary | ICD-10-CM

## 2013-08-21 DIAGNOSIS — J449 Chronic obstructive pulmonary disease, unspecified: Secondary | ICD-10-CM

## 2013-08-21 DIAGNOSIS — E785 Hyperlipidemia, unspecified: Secondary | ICD-10-CM

## 2013-08-21 NOTE — Patient Instructions (Signed)
Your physician recommends that you continue on your current medications as directed. Please refer to the Current Medication list given to you today.  Your physician wants you to follow-up in: 4 months with fasting labs (lp/bmet/hfp) and EKG You will receive a reminder letter in the mail two months in advance. If you don't receive a letter, please call our office to schedule the follow-up appointment.  

## 2013-08-21 NOTE — Assessment & Plan Note (Signed)
The patient has not had any recurrent chest pain or angina.  She is back to working.  She teaches Education officer, museum at [employer].

## 2013-08-21 NOTE — Assessment & Plan Note (Signed)
The patient was questioning whether she really needed to be on a statin.  Since she does have coronary disease, albeit nonobstructive, she does need a statin.  It also helps to stabilize her endothelium.  She is not having any myalgias from statin.

## 2013-08-21 NOTE — Assessment & Plan Note (Signed)
She has portable oxygen.  She does not have to use it all the time.  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  She does use it when she goes to the grocery store.

## 2013-08-21 NOTE — Progress Notes (Signed)
Brooke [name] Date of Birth:  February 02, 1946 [address] [place], [place]  [address] [phone]         Fax   [phone]  History of Present Illness: This pleasant 68 year old woman is seen back for a followup office visit.  We had seen her in the hospital when she was admitted on 06/05/13 until 06/09/13 with a non-STEMI the etiology of a non-STEMI was thought to be related to either vasospasm or demand ischemia.  She had been off her oxygen therapy for her COPD.  The event occurred after she had lost her car keys while shopping and got into somewhat of a panic.  The patient had left heart cardiac catheterization on 06/09/11 which showed an ostial diagonal 1 of less than 30% and a mid LAD with long segment of myocardial bridging with very mild systolic compression likely of little clinical significance, she also had a mid right coronary artery lesion 30%.  Her ejection fraction was 60%.  She was discharged home on statins aspirin beta blocker and isosorbide.  The beta blocker was subsequently discontinued because of hypotension and the isosorbide was discontinued because of headache.  She has been able to take amlodipine without adverse effects.  Current Outpatient Prescriptions  Medication Sig Dispense Refill  . albuterol (PROVENTIL HFA;VENTOLIN HFA) 108 (90 BASE) MCG/ACT inhaler Inhale 2 puffs into the lungs every 6 (six) hours as needed. For wheezing/shortness of breath      . amLODipine (NORVASC) 2.5 MG tablet Take 1 tablet (2.5 mg total) by mouth daily.  30 tablet  6  . amoxicillin (AMOXIL) 500 MG capsule Take 500 mg by mouth 2 (two) times daily.       Marland Kitchen aspirin 81 MG tablet Take 81 mg by mouth every other day.      Marland Kitchen atorvastatin (LIPITOR) 40 MG tablet Take 1 tablet (40 mg total) by mouth every evening.  30 tablet  6  . budesonide-formoterol (SYMBICORT) 160-4.5 MCG/ACT inhaler Inhale 2 puffs into the lungs 2 (two) times daily.      Marland Kitchen ipratropium-albuterol (DUONEB)  0.5-2.5 (3) MG/3ML SOLN Take 3 mLs by nebulization 2 (two) times daily.      . nitroGLYCERIN (NITROSTAT) 0.4 MG SL tablet Place 1 tablet (0.4 mg total) under the tongue every 5 (five) minutes as needed for chest pain (up to 3 doses).  25 tablet  3   No current facility-administered medications for this visit.    Allergies  Allergen Reactions  . Codeine Nausea And Vomiting  . Imdur [Isosorbide Dinitrate]     headache  . Prednisone Other (See Comments)    Reaction:Abnormal behavior; cannot take in pill form but CAN tolerate the injection    Patient Active Problem List   Diagnosis Date Noted  . GERD (gastroesophageal reflux disease)   . Former tobacco use   . HLD (hyperlipidemia) 06/08/2013  . NSTEMI (non-ST elevated myocardial infarction) 06/05/2013  . COPD (chronic obstructive pulmonary disease) 06/05/2013    History  Smoking status  . Former Smoker  Smokeless tobacco  . Never Used    History  Alcohol Use  . Yes    Comment: rarely    Family History  Problem Relation Age of Onset  . Cancer Father     Review of Systems: Constitutional: no fever chills diaphoresis or fatigue or change in weight.  Head and neck: no hearing loss, no epistaxis, no photophobia or visual disturbance. Respiratory: No cough, shortness of breath or  wheezing. Cardiovascular: No chest pain peripheral edema, palpitations. Gastrointestinal: No abdominal distention, no abdominal pain, no change in bowel habits hematochezia or melena. Genitourinary: No dysuria, no frequency, no urgency, no nocturia. Musculoskeletal:No arthralgias, no back pain, no gait disturbance or myalgias. Neurological: No dizziness, no headaches, no numbness, no seizures, no syncope, no weakness, no tremors. Hematologic: No lymphadenopathy, no easy bruising. Psychiatric: No confusion, no hallucinations, no sleep disturbance.    Physical Exam: Filed Vitals:   08/21/13 0957  BP: 120/76  Pulse: 62   the general appearance  reveals a well-developed well-nourished woman in no distress.The head and neck exam reveals pupils equal and reactive.  Extraocular movements are full.  There is no scleral icterus.  The mouth and pharynx are normal.  The neck is supple.  The carotids reveal no bruits.  The jugular venous pressure is normal.  The  thyroid is not enlarged.  There is no lymphadenopathy.  The chest is clear to percussion and auscultation.  There are no rales or rhonchi.  Expansion of the chest is symmetrical.  The precordium is quiet.  The first heart sound is normal.  The second heart sound is physiologically split.  There is no murmur gallop rub or click.  There is no abnormal lift or heave.  The abdomen is soft and nontender.  The bowel sounds are normal.  The liver and spleen are not enlarged.  There are no abdominal masses.  There are no abdominal bruits.  Extremities reveal good pedal pulses.  There is no phlebitis or edema.  There is no cyanosis or clubbing.  Strength is normal and symmetrical in all extremities.  There is no lateralizing weakness.  There are no sensory deficits.  The skin is warm and dry.  There is no rash.     Assessment / Plan: 1. non-STEMI 2. COPD 3. hypercholesterolemia on statin therapy  Plan continue current therapy.  She is taking her baby aspirin just every other day because of excessive nosebleeds and bruising. Recheck in 4 months for followup office visit EKG and fasting lipid panel hepatic function panel and basal metabolic panel.

## 2013-09-15 ENCOUNTER — Telehealth: Payer: Self-pay | Admitting: Cardiology

## 2013-09-15 NOTE — Telephone Encounter (Signed)
New Message:  Pt wants to know if she needs to take amoxicillin prior to getting her teeth cleaned.

## 2013-09-15 NOTE — Telephone Encounter (Signed)
Left message to call back  

## 2013-09-16 NOTE — Telephone Encounter (Signed)
Returning [place] call.  Advised Brooke [name] not in office this afternoon but will be here tomorrow.  Will forward message to her. Her Appointment is Friday. She will be home in the morning.

## 2013-09-16 NOTE — Telephone Encounter (Signed)
Follow up     Returning Melinda's call.  If she needs an antiobiotic, please call it in to [facility].  OK to leave msg on vm to let pt know.  Her appt is Friday am

## 2013-09-17 NOTE — Telephone Encounter (Signed)
Spoke with patient and she did have heart cath in January without any stent. Advised no premed needed

## 2013-09-18 NOTE — Telephone Encounter (Signed)
Correct.  No antibiotic needed.

## 2013-12-11 ENCOUNTER — Other Ambulatory Visit (INDEPENDENT_AMBULATORY_CARE_PROVIDER_SITE_OTHER): Payer: Medicare Other

## 2013-12-11 DIAGNOSIS — E785 Hyperlipidemia, unspecified: Secondary | ICD-10-CM

## 2013-12-11 LAB — BASIC METABOLIC PANEL
BUN: 14 mg/dL (ref 6–23)
CHLORIDE: 103 meq/L (ref 96–112)
CO2: 26 meq/L (ref 19–32)
Calcium: 9.3 mg/dL (ref 8.4–10.5)
Creatinine, Ser: 0.7 mg/dL (ref 0.4–1.2)
GFR: 96.29 mL/min (ref >60.00)
GLUCOSE: 87 mg/dL (ref 70–99)
Potassium: 4 mEq/L (ref 3.5–5.1)
SODIUM: 137 meq/L (ref 135–145)

## 2013-12-11 LAB — HEPATIC FUNCTION PANEL
ALT: 20 U/L (ref 0–35)
AST: 18 U/L (ref 0–37)
Albumin: 3.8 g/dL (ref 3.5–5.2)
Alkaline Phosphatase: 64 U/L (ref 39–117)
BILIRUBIN TOTAL: 0.4 mg/dL (ref 0.2–1.2)
Bilirubin, Direct: 0 mg/dL (ref 0.0–0.3)
Total Protein: 6.8 g/dL (ref 6.0–8.3)

## 2013-12-11 LAB — LIPID PANEL
Cholesterol: 160 mg/dL (ref 0–200)
HDL: 71.1 mg/dL (ref >39.00)
LDL CALC: 77 mg/dL (ref 0–99)
NONHDL: 88.9
Total CHOL/HDL Ratio: 2
Triglycerides: 60 mg/dL (ref 0.0–149.0)
VLDL: 12 mg/dL (ref 0.0–40.0)

## 2013-12-11 NOTE — Progress Notes (Signed)
Quick Note:  Please make copy of labs for patient visit. ______ 

## 2013-12-16 ENCOUNTER — Other Ambulatory Visit (HOSPITAL_COMMUNITY): Payer: Self-pay | Admitting: Physician Assistant

## 2013-12-18 ENCOUNTER — Ambulatory Visit (INDEPENDENT_AMBULATORY_CARE_PROVIDER_SITE_OTHER): Payer: Medicare Other | Admitting: Cardiology

## 2013-12-18 ENCOUNTER — Encounter: Payer: Self-pay | Admitting: Cardiology

## 2013-12-18 VITALS — BP 139/73 | HR 64 | Ht 63.0 in | Wt 142.4 lb

## 2013-12-18 DIAGNOSIS — E785 Hyperlipidemia, unspecified: Secondary | ICD-10-CM

## 2013-12-18 DIAGNOSIS — I214 Non-ST elevation (NSTEMI) myocardial infarction: Secondary | ICD-10-CM

## 2013-12-18 DIAGNOSIS — J438 Other emphysema: Secondary | ICD-10-CM

## 2013-12-18 DIAGNOSIS — K219 Gastro-esophageal reflux disease without esophagitis: Secondary | ICD-10-CM

## 2013-12-18 NOTE — Progress Notes (Signed)
Brooke [name] Date of Birth:  Jun 06, 1945 [facility] [address] [place], [place]  [address] [phone]        Fax   [phone]   History of Present Illness: This pleasant 68 year old woman is seen back for a followup office visit. We had seen her in the hospital when she was admitted on 06/05/13 until 06/09/13 with a non-STEMI.  The etiology of the non-STEMI was thought to be related to either vasospasm or demand ischemia. She had been off her oxygen therapy for her COPD. The event occurred after she had lost her car keys while shopping and got into somewhat of a panic. The patient had left heart cardiac catheterization on 06/09/11 which showed an ostial diagonal 1 of less than 30% and a mid LAD with long segment of myocardial bridging with very mild systolic compression likely of little clinical significance, she also had a mid right coronary artery lesion 30%. Her ejection fraction was 60%. She was discharged home on statins aspirin beta blocker and isosorbide. The beta blocker was subsequently discontinued because of hypotension and the isosorbide was discontinued because of headache. She has been able to take amlodipine without adverse effects.   Current Outpatient Prescriptions  Medication Sig Dispense Refill  . albuterol (PROVENTIL HFA;VENTOLIN HFA) 108 (90 BASE) MCG/ACT inhaler Inhale 2 puffs into the lungs every 6 (six) hours as needed. For wheezing/shortness of breath      . amLODipine (NORVASC) 2.5 MG tablet TAKE 1 TABLET (2.5 MG TOTAL) BY MOUTH DAILY.  30 tablet  0  . aspirin 81 MG tablet Take 81 mg by mouth every other day.      Marland Kitchen atorvastatin (LIPITOR) 40 MG tablet TAKE 1 TABLET (40 MG TOTAL) BY MOUTH EVERY EVENING.  30 tablet  0  . budesonide-formoterol (SYMBICORT) 160-4.5 MCG/ACT inhaler Inhale 2 puffs into the lungs 2 (two) times daily.      Marland Kitchen ipratropium-albuterol (DUONEB) 0.5-2.5 (3) MG/3ML SOLN Take 3 mLs by nebulization 2 (two) times daily.       . nitroGLYCERIN (NITROSTAT) 0.4 MG SL tablet Place 1 tablet (0.4 mg total) under the tongue every 5 (five) minutes as needed for chest pain (up to 3 doses).  25 tablet  3  . amoxicillin (AMOXIL) 500 MG capsule Take 500 mg by mouth 2 (two) times daily.        No current facility-administered medications for this visit.    Allergies  Allergen Reactions  . Codeine Nausea And Vomiting  . Imdur [Isosorbide Dinitrate]     headache  . Prednisone Other (See Comments)    Reaction:Abnormal behavior; cannot take in pill form but CAN tolerate the injection    Patient Active Problem List   Diagnosis Date Noted  . GERD (gastroesophageal reflux disease)   . Former tobacco use   . HLD (hyperlipidemia) 06/08/2013  . NSTEMI (non-ST elevated myocardial infarction) 06/05/2013  . COPD (chronic obstructive pulmonary disease) 06/05/2013    History  Smoking status  . Former Smoker  Smokeless tobacco  . Never Used    History  Alcohol Use  . Yes    Comment: rarely    Family History  Problem Relation Age of Onset  . Cancer Father     Review of Systems: Constitutional: no fever chills diaphoresis or fatigue or change in weight.  Head and neck: no hearing loss, no epistaxis, no photophobia or visual disturbance. Respiratory: No cough, shortness of breath or wheezing. Cardiovascular: No chest  pain peripheral edema, palpitations. Gastrointestinal: No abdominal distention, no abdominal pain, no change in bowel habits hematochezia or melena. Genitourinary: No dysuria, no frequency, no urgency, no nocturia. Musculoskeletal:No arthralgias, no back pain, no gait disturbance or myalgias. Neurological: No dizziness, no headaches, no numbness, no seizures, no syncope, no weakness, no tremors. Hematologic: No lymphadenopathy, no easy bruising. Psychiatric: No confusion, no hallucinations, no sleep disturbance.    Physical Exam: Filed Vitals:   12/18/13 1019  BP: 139/73  Pulse: 64   the general  appearance reveals a well-developed well-nourished woman in no distress.The head and neck exam reveals pupils equal and reactive.  Extraocular movements are full.  There is no scleral icterus.  The mouth and pharynx are normal.  The neck is supple.  The carotids reveal no bruits.  The jugular venous pressure is normal.  The  thyroid is not enlarged.  There is no lymphadenopathy.  The chest is clear to percussion and auscultation.  There are no rales or rhonchi.  Expansion of the chest is symmetrical.  The precordium is quiet.  The first heart sound is normal.  The second heart sound is physiologically split.  There is no murmur gallop rub or click.  There is no abnormal lift or heave.  The abdomen is soft and nontender.  The bowel sounds are normal.  The liver and spleen are not enlarged.  There are no abdominal masses.  There are no abdominal bruits.  Extremities reveal good pedal pulses.  There is no phlebitis or edema.  There is no cyanosis or clubbing.  Strength is normal and symmetrical in all extremities.  There is no lateralizing weakness.  There are no sensory deficits.  The skin is warm and dry.  There is no rash.  EKG shows normal sinus rhythm and is within normal limits.     Assessment / Plan: 1.  Non-STEMI on 06/05/13 with cardiac catheterization showing nonobstructive coronary disease and ejection fraction 60%.  Felt likely to be secondary to coronary spasm. 2. Hypercholesterolemia 3. COPD with home oxygen  Disposition: Continue same medication.  Recheck in 6 months for office visit lipid panel hepatic function panel and basal metabolic panel.

## 2013-12-18 NOTE — Assessment & Plan Note (Addendum)
The patient has a history of hyperlipidemia.  She is on Lipitor.  She is not having myalgias.  Lab work was reviewed is satisfactory.

## 2013-12-18 NOTE — Patient Instructions (Signed)
Your physician recommends that you continue on your current medications as directed. Please refer to the Current Medication list given to you today.  Your physician wants you to follow-up in: 6 months with fasting labs (lp/bmet/hfp)  You will receive a reminder letter in the mail two months in advance. If you don't receive a letter, please call our office to schedule the follow-up appointment.  

## 2013-12-18 NOTE — Assessment & Plan Note (Signed)
She has a history of gastroesophageal reflux.

## 2013-12-18 NOTE — Assessment & Plan Note (Signed)
She is a former smoker.  She has COPD.  She has home oxygen.  She carries an oximeter with her to help judge when she needs to use her oxygen

## 2013-12-18 NOTE — Assessment & Plan Note (Signed)
The patient has not had any recurrent chest pain or angina

## 2013-12-28 ENCOUNTER — Other Ambulatory Visit: Payer: Self-pay

## 2013-12-28 DIAGNOSIS — Z1231 Encounter for screening mammogram for malignant neoplasm of breast: Secondary | ICD-10-CM

## 2014-01-14 ENCOUNTER — Other Ambulatory Visit (HOSPITAL_COMMUNITY): Payer: Self-pay | Admitting: Cardiology

## 2014-01-20 ENCOUNTER — Ambulatory Visit
Admission: RE | Admit: 2014-01-20 | Discharge: 2014-01-20 | Disposition: A | Discharge status: Home / Self Care | Payer: Medicare Other | Source: Ambulatory Visit

## 2014-01-20 DIAGNOSIS — Z1231 Encounter for screening mammogram for malignant neoplasm of breast: Secondary | ICD-10-CM

## 2014-04-15 ENCOUNTER — Encounter (HOSPITAL_COMMUNITY): Payer: Self-pay | Admitting: Cardiovascular Disease

## 2014-04-21 ENCOUNTER — Telehealth: Payer: Self-pay | Admitting: Cardiology

## 2014-04-21 NOTE — Telephone Encounter (Signed)
Received request from Nurse fax box, documents faxed for surgical clearance. BD:[id number]   Fax number: [phone] Attention: 12.16.15/KM

## 2014-06-24 ENCOUNTER — Ambulatory Visit (INDEPENDENT_AMBULATORY_CARE_PROVIDER_SITE_OTHER): Payer: Medicare Other | Admitting: Cardiology

## 2014-06-24 ENCOUNTER — Encounter: Payer: Self-pay | Admitting: Cardiology

## 2014-06-24 VITALS — BP 128/88 | HR 62 | Ht 63.0 in | Wt 142.4 lb

## 2014-06-24 DIAGNOSIS — J438 Other emphysema: Secondary | ICD-10-CM | POA: Diagnosis not present

## 2014-06-24 DIAGNOSIS — I214 Non-ST elevation (NSTEMI) myocardial infarction: Secondary | ICD-10-CM

## 2014-06-24 DIAGNOSIS — E785 Hyperlipidemia, unspecified: Secondary | ICD-10-CM

## 2014-06-24 NOTE — Progress Notes (Signed)
Cardiology Office Note   Date:  06/24/2014   ID:  Brooke [name], Brooke [name] [birth year], MRN [id number]  PCP:  Lynne Logan, MD  Cardiologist:   Darlin Coco, MD   No chief complaint on file.     History of Present Illness: Brooke [name] is a 69 y.o. female who presents for a six-month follow-up office visit  This pleasant 69 year old woman is seen back for a followup office visit. We had seen her in the hospital when she was admitted on 06/05/13 until 06/09/13 with a non-STEMI. The etiology of the non-STEMI was thought to be related to either vasospasm or demand ischemia. She had been off her oxygen therapy for her COPD. The event occurred after she had lost her car keys while shopping and got into somewhat of a panic. The patient had left heart cardiac catheterization on 06/09/11 which showed an ostial diagonal 1 of less than 30% and a mid LAD with long segment of myocardial bridging with very mild systolic compression likely of little clinical significance, she also had a mid right coronary artery lesion 30%. Her ejection fraction was 60%. She was discharged home on statins aspirin beta blocker and isosorbide. The beta blocker was subsequently discontinued because of hypotension and the isosorbide was discontinued because of headache. She has been able to take amlodipine without adverse effects. Since last visit she's had no recurrence of chest pain.  No dizziness or syncope.  Her oxygen levels drop into the 80s if she climbs stairs without having her nasal oxygen in place.  Past Medical History  Diagnosis Date  . COPD (chronic obstructive pulmonary disease)     a. Home O2.  . NSTEMI (non-ST elevated myocardial infarction)     a. 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 Coronary vasospasm  . GERD (gastroesophageal reflux disease)   . Former tobacco use   . Hyperlipidemia   . Chronic respiratory failure     a.  on home O2.    Past Surgical History  Procedure Laterality Date  . Left heart catheterization with coronary angiogram N/A 06/08/2013    Procedure: LEFT HEART CATHETERIZATION WITH CORONARY ANGIOGRAM;  Surgeon: Sanda Klein, MD;  Location: [facility] CATH [facility];  Service: Cardiovascular;  Laterality: N/A;     Current Outpatient Prescriptions  Medication Sig Dispense Refill  . albuterol (PROVENTIL HFA;VENTOLIN HFA) 108 (90 BASE) MCG/ACT inhaler Inhale 2 puffs into the lungs every 6 (six) hours as needed. For wheezing/shortness of breath    . amLODipine (NORVASC) 2.5 MG tablet TAKE 1 TABLET BY MOUTH EVERY DAY 30 tablet 5  . aspirin 81 MG tablet Take 81 mg by mouth every other day.    Marland Kitchen atorvastatin (LIPITOR) 40 MG tablet TAKE 1 TABLET (40 MG TOTAL) BY MOUTH EVERY EVENING. 30 tablet 5  . budesonide-formoterol (SYMBICORT) 160-4.5 MCG/ACT inhaler Inhale 2 puffs into the lungs 2 (two) times daily.    Marland Kitchen ipratropium-albuterol (DUONEB) 0.5-2.5 (3) MG/3ML SOLN Take 3 mLs by nebulization 2 (two) times daily.    . nitroGLYCERIN (NITROSTAT) 0.4 MG SL tablet Place 1 tablet (0.4 mg total) under the tongue every 5 (five) minutes as needed for chest pain (up to 3 doses). 25 tablet 3   No current facility-administered medications for this visit.    Allergies:   Codeine; Imdur; and Prednisone    Social History:  The patient  reports that she has quit smoking. She has never used smokeless tobacco. She  reports that she drinks alcohol. She reports that she does not use illicit drugs.   Family History:  The patient's family history includes Cancer in her father.    ROS:  Please see the history of present illness.   Otherwise, review of systems are positive for none.   All other systems are reviewed and negative.    PHYSICAL EXAM: VS:  BP 128/88 mmHg  Pulse 62  Ht 5\' 3"  (1.6 m)  Wt 142 lb 6.4 oz (64.592 kg)  BMI 25.23 kg/m2 , BMI Body mass index is 25.23 kg/(m^2). GEN: Well nourished, well developed, in no acute  distress HEENT: normal Neck: no JVD, carotid bruits, or masses Cardiac: RRR; no murmurs, rubs, or gallops,no edema  Respiratory:  clear to auscultation bilaterally, normal work of breathing GI: soft, nontender, nondistended, + BS MS: no deformity or atrophy Skin: warm and dry, no rash Neuro:  Strength and sensation are intact Psych: euthymic mood, full affect   EKG:  EKG is not ordered today.    Recent Labs: 12/11/2013: ALT 20; BUN 14; Creatinine 0.7; Potassium 4.0; Sodium 137    Lipid Panel    Component Value Date/Time   CHOL 160 12/11/2013 0911   TRIG 60.0 12/11/2013 0911   HDL 71.10 12/11/2013 0911   CHOLHDL 2 12/11/2013 0911   VLDL 12.0 12/11/2013 0911   LDLCALC 77 12/11/2013 0911      Wt Readings from Last 3 Encounters:  06/24/14 142 lb 6.4 oz (64.592 kg)  12/18/13 142 lb 6.4 oz (64.592 kg)  08/21/13 136 lb (61.689 kg)         ASSESSMENT AND PLAN:  1. Non-STEMI on 06/05/13 with cardiac catheterization showing nonobstructive coronary disease and ejection fraction 60%. Felt likely to be secondary to coronary spasm. 2. Hypercholesterolemia 3. COPD with home oxygen   Current medicines are reviewed at length with the patient today.  The patient does not have concerns regarding medicines.  The following changes have been made:  no change  Labs/ tests ordered today include: None  No orders of the defined types were placed in this encounter.     Disposition:   FU with Dr. Mare Ferrari in 6 months for office visit EKG lipid panel hepatic function panel and basal metabolic panel. She is not having any myalgias from her statin therapy.  We refilled her amlodipine and her atorvastatin today.   Signed, Darlin Coco, MD  06/24/2014 1:54 PM    [facility] [place], [place], [place]  [address] Phone: [phone]; Fax: [phone]

## 2014-06-24 NOTE — Patient Instructions (Signed)
Your physician recommends that you continue on your current medications as directed. Please refer to the Current Medication list given to you today.  Your physician wants you to follow-up in: 6 months with fasting labs (lp/bmet/hfp) AND EKG  You will receive a reminder letter in the mail two months in advance. If you don't receive a letter, please call our office to schedule the follow-up appointment.

## 2014-06-28 ENCOUNTER — Other Ambulatory Visit: Payer: Self-pay | Admitting: Cardiology

## 2014-10-18 ENCOUNTER — Other Ambulatory Visit: Payer: Self-pay | Admitting: Cardiology

## 2014-12-08 ENCOUNTER — Other Ambulatory Visit: Payer: Self-pay | Admitting: Cardiology

## 2014-12-18 ENCOUNTER — Other Ambulatory Visit: Payer: Self-pay | Admitting: Cardiology

## 2015-01-17 ENCOUNTER — Other Ambulatory Visit: Payer: Self-pay | Admitting: Cardiology

## 2015-01-18 ENCOUNTER — Other Ambulatory Visit: Payer: Self-pay | Admitting: Cardiology

## 2015-01-19 ENCOUNTER — Encounter: Payer: Self-pay | Admitting: Cardiology

## 2015-01-20 ENCOUNTER — Encounter: Payer: Self-pay | Admitting: Cardiology

## 2015-02-09 ENCOUNTER — Telehealth: Payer: Self-pay | Admitting: Cardiology

## 2015-02-09 DIAGNOSIS — E7800 Pure hypercholesterolemia, unspecified: Secondary | ICD-10-CM

## 2015-02-09 NOTE — Telephone Encounter (Signed)
New message    Call patient to r/s appt from 10.24.2016  Patient wants to know should she have fasting lab work drawn.

## 2015-02-09 NOTE — Telephone Encounter (Signed)
Lab order put in epic

## 2015-02-17 ENCOUNTER — Ambulatory Visit (INDEPENDENT_AMBULATORY_CARE_PROVIDER_SITE_OTHER): Payer: Medicare Other | Admitting: Cardiology

## 2015-02-17 ENCOUNTER — Encounter: Payer: Self-pay | Admitting: Cardiology

## 2015-02-17 VITALS — BP 104/70 | HR 77 | Ht 63.5 in | Wt 138.1 lb

## 2015-02-17 DIAGNOSIS — E785 Hyperlipidemia, unspecified: Secondary | ICD-10-CM | POA: Diagnosis not present

## 2015-02-17 LAB — HEPATIC FUNCTION PANEL
ALBUMIN: 4.1 g/dL (ref 3.6–5.1)
ALK PHOS: 71 U/L (ref 33–130)
ALT: 20 U/L (ref 6–29)
AST: 20 U/L (ref 10–35)
BILIRUBIN TOTAL: 0.4 mg/dL (ref 0.2–1.2)
Bilirubin, Direct: 0.1 mg/dL (ref <=0.2)
Indirect Bilirubin: 0.3 mg/dL (ref 0.2–1.2)
TOTAL PROTEIN: 6.9 g/dL (ref 6.1–8.1)

## 2015-02-17 LAB — BASIC METABOLIC PANEL
BUN: 13 mg/dL (ref 7–25)
CO2: 28 mmol/L (ref 20–31)
CREATININE: 0.57 mg/dL (ref 0.50–0.99)
Calcium: 9.2 mg/dL (ref 8.6–10.4)
Chloride: 104 mmol/L (ref 98–110)
Glucose, Bld: 100 mg/dL — ABNORMAL HIGH (ref 65–99)
Potassium: 4.2 mmol/L (ref 3.5–5.3)
SODIUM: 138 mmol/L (ref 135–146)

## 2015-02-17 LAB — LIPID PANEL
CHOL/HDL RATIO: 2.1 ratio (ref <=5.0)
Cholesterol: 163 mg/dL (ref 125–200)
HDL: 78 mg/dL (ref >=46)
LDL Cholesterol: 75 mg/dL (ref <130)
TRIGLYCERIDES: 52 mg/dL (ref <150)
VLDL: 10 mg/dL (ref <30)

## 2015-02-17 NOTE — Progress Notes (Signed)
Cardiology Office Note   Date:  02/17/2015   ID:  Marceil, Welp [birth year], MRN [id number]  PCP:  Woody Seller, MD  Cardiologist: Darlin Coco MD  No chief complaint on file.     History of Present Illness: Brooke [name] is a 69 y.o. female who presents for 6 month follow-up visit  This pleasant 69 year old woman is seen back for a followup office visit. We had seen her in the hospital when she was admitted on 06/05/13 until 06/09/13 with a non-STEMI. The etiology of the non-STEMI was thought to be related to either vasospasm or demand ischemia. She had been off her oxygen therapy for her COPD. The event occurred after she had lost her car keys while shopping and got into somewhat of a panic. The patient had left heart cardiac catheterization on 06/09/11 which showed an ostial diagonal 1 of less than 30% and a mid LAD with long segment of myocardial bridging with very mild systolic compression likely of little clinical significance, she also had a mid right coronary artery lesion 30%. Her ejection fraction was 60%. She was discharged home on statins aspirin beta blocker and isosorbide. The beta blocker was subsequently discontinued because of hypotension and the isosorbide was discontinued because of headache. She has been able to take amlodipine without adverse effects. Since last visit she's had no recurrence of chest pain. No dizziness or syncope. Her oxygen levels drop into the 80s if she climbs stairs without having her nasal oxygen in place. Since we last saw her she has retired although she keeps open the possibility that she will return to work in the future.  Past Medical History  Diagnosis Date  . COPD (chronic obstructive pulmonary disease) ([place])     a. Home O2.  . NSTEMI (non-ST elevated myocardial infarction) ([place])     a. 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 Coronary vasospasm  .  GERD (gastroesophageal reflux disease)   . Former tobacco use   . Hyperlipidemia   . Chronic respiratory failure (HCC)     a. on home O2.    Past Surgical History  Procedure Laterality Date  . Left heart catheterization with coronary angiogram N/A 06/08/2013    Procedure: LEFT HEART CATHETERIZATION WITH CORONARY ANGIOGRAM;  Surgeon: Sanda Klein, MD;  Location: [facility];  Service: Cardiovascular;  Laterality: N/A;     Current Outpatient Prescriptions  Medication Sig Dispense Refill  . albuterol (PROVENTIL HFA;VENTOLIN HFA) 108 (90 BASE) MCG/ACT inhaler Inhale 2 puffs into the lungs every 6 (six) hours as needed. For wheezing/shortness of breath    . amLODipine (NORVASC) 2.5 MG tablet TAKE 1 TABLET BY MOUTH EVERY DAY 30 tablet 5  . aspirin 81 MG tablet Take 81 mg by mouth every other day.    Marland Kitchen atorvastatin (LIPITOR) 40 MG tablet TAKE 1 TABLET BY MOUTH EVERY EVENING 30 tablet 5  . budesonide-formoterol (SYMBICORT) 160-4.5 MCG/ACT inhaler Inhale 2 puffs into the lungs 2 (two) times daily.    Marland Kitchen ipratropium-albuterol (DUONEB) 0.5-2.5 (3) MG/3ML SOLN Take 3 mLs by nebulization 2 (two) times daily.    . nitroGLYCERIN (NITROSTAT) 0.4 MG SL tablet Place 1 tablet (0.4 mg total) under the tongue every 5 (five) minutes as needed for chest pain (up to 3 doses). 25 tablet 3   No current facility-administered medications for this visit.    Allergies:   Codeine; Imdur; and Prednisone    Social  History:  The patient  reports that she has quit smoking. She has never used smokeless tobacco. She reports that she drinks alcohol. She reports that she does not use illicit drugs.   Family History:  The patient's family history includes Cancer in her father.    ROS:  Please see the history of present illness.   Otherwise, review of systems are positive for none.   All other systems are reviewed and negative.    PHYSICAL EXAM: VS:  BP 104/70 mmHg  Pulse 77  Ht 5' 3.5" (1.613 m)  Wt 138 lb 1.9 oz  (62.651 kg)  BMI 24.08 kg/m2 , BMI Body mass index is 24.08 kg/(m^2). GEN: Well nourished, well developed, in no acute distress HEENT: normal Neck: no JVD, carotid bruits, or masses Cardiac: RRR; no murmurs, rubs, or gallops,no edema  Respiratory:  clear to auscultation bilaterally, normal work of breathing GI: soft, nontender, nondistended, + BS MS: no deformity or atrophy Skin: warm and dry, no rash Neuro:  Strength and sensation are intact Psych: euthymic mood, full affect   EKG:  EKG is not ordered today.    Recent Labs: No results found for requested labs within last 365 days.    Lipid Panel    Component Value Date/Time   CHOL 160 12/11/2013 0911   TRIG 60.0 12/11/2013 0911   HDL 71.10 12/11/2013 0911   CHOLHDL 2 12/11/2013 0911   VLDL 12.0 12/11/2013 0911   LDLCALC 77 12/11/2013 0911      Wt Readings from Last 3 Encounters:  02/17/15 138 lb 1.9 oz (62.651 kg)  06/24/14 142 lb 6.4 oz (64.592 kg)  12/18/13 142 lb 6.4 oz (64.592 kg)         ASSESSMENT AND PLAN:  1. Non-STEMI on 06/05/13 with cardiac catheterization showing nonobstructive coronary disease and ejection fraction 60%. Felt likely to be secondary to coronary spasm. 2. Hypercholesterolemia 3. COPD with home oxygen   Current medicines are reviewed at length with the patient today.  The patient does not have concerns regarding medicines.  The following changes have been made:  no change  Labs/ tests ordered today include:   Orders Placed This Encounter  Procedures  . Lipid panel  . Hepatic function panel  . Basic metabolic panel    Disposition: We drilled blood work today, results are pending.  She states that she forgets to take her Lipitor about half the time.  She takes it in the evening.  She will return in 6 months for follow-up office visit EKG lipid panel hepatic function panel and basal metabolic panel   Signed, Darlin Coco MD 02/17/2015 1:35 PM    [facility] [place], [place], [place]  [address] Phone: [phone]; Fax: [phone]

## 2015-02-17 NOTE — Patient Instructions (Signed)
Medication Instructions:  Your physician recommends that you continue on your current medications as directed. Please refer to the Current Medication list given to you today.  Labwork: none  Testing/Procedures: none  Follow-Up: Your physician recommends that you schedule a follow-up appointment in: 6 months with fasting labs (lp/bmet/hfp) with Tera Helper NP or Brynda Rim PA

## 2015-02-17 NOTE — Progress Notes (Signed)
Quick Note:  Please report to patient. The recent labs are stable. Continue same medication and careful diet. Cholesterol and LDL okay ______

## 2015-02-28 ENCOUNTER — Ambulatory Visit: Payer: Medicare Other | Admitting: Cardiology

## 2015-05-09 ENCOUNTER — Other Ambulatory Visit: Payer: Self-pay | Admitting: Physician Assistant

## 2015-08-01 ENCOUNTER — Other Ambulatory Visit: Payer: Self-pay | Admitting: Cardiology

## 2015-08-18 ENCOUNTER — Ambulatory Visit: Payer: Medicare Other | Admitting: Physician Assistant

## 2015-08-18 ENCOUNTER — Encounter: Payer: Self-pay | Admitting: Physician Assistant

## 2015-08-18 ENCOUNTER — Ambulatory Visit (INDEPENDENT_AMBULATORY_CARE_PROVIDER_SITE_OTHER): Payer: Medicare Other | Admitting: Physician Assistant

## 2015-08-18 VITALS — BP 120/82 | HR 74 | Ht 63.5 in | Wt 137.8 lb

## 2015-08-18 DIAGNOSIS — E7800 Pure hypercholesterolemia, unspecified: Secondary | ICD-10-CM

## 2015-08-18 DIAGNOSIS — I201 Angina pectoris with documented spasm: Secondary | ICD-10-CM | POA: Diagnosis not present

## 2015-08-18 DIAGNOSIS — E785 Hyperlipidemia, unspecified: Secondary | ICD-10-CM | POA: Diagnosis not present

## 2015-08-18 LAB — HEPATIC FUNCTION PANEL
ALBUMIN: 3.9 g/dL (ref 3.6–5.1)
ALT: 14 U/L (ref 6–29)
AST: 15 U/L (ref 10–35)
Alkaline Phosphatase: 84 U/L (ref 33–130)
BILIRUBIN DIRECT: 0.1 mg/dL (ref <=0.2)
BILIRUBIN TOTAL: 0.4 mg/dL (ref 0.2–1.2)
Indirect Bilirubin: 0.3 mg/dL (ref 0.2–1.2)
Total Protein: 6.9 g/dL (ref 6.1–8.1)

## 2015-08-18 LAB — BASIC METABOLIC PANEL
BUN: 14 mg/dL (ref 7–25)
CO2: 25 mmol/L (ref 20–31)
CREATININE: 0.59 mg/dL (ref 0.50–0.99)
Calcium: 9.5 mg/dL (ref 8.6–10.4)
Chloride: 102 mmol/L (ref 98–110)
GLUCOSE: 95 mg/dL (ref 65–99)
Potassium: 4.6 mmol/L (ref 3.5–5.3)
Sodium: 139 mmol/L (ref 135–146)

## 2015-08-18 LAB — LIPID PANEL
CHOL/HDL RATIO: 2.2 ratio (ref <=5.0)
CHOLESTEROL: 166 mg/dL (ref 125–200)
HDL: 76 mg/dL (ref >=46)
LDL Cholesterol: 78 mg/dL (ref <130)
TRIGLYCERIDES: 58 mg/dL (ref <150)
VLDL: 12 mg/dL (ref <30)

## 2015-08-18 MED ORDER — AMLODIPINE BESYLATE 2.5 MG PO TABS: 2.5000 mg | ORAL_TABLET | Freq: Every day | ORAL | Status: DC

## 2015-08-18 MED ORDER — ATORVASTATIN CALCIUM 40 MG PO TABS: 40.0000 mg | ORAL_TABLET | ORAL | Status: DC

## 2015-08-18 NOTE — Patient Instructions (Signed)
Medication Instructions:  Your physician recommends that you continue on your current medications as directed. Please refer to the Current Medication list given to you today. 1. Your Amlodipine ( 2.5 mg) daily was sent into your requested pharmacy today. 2. Your Lipitor ( 40 mg ) every other day was sent into your requested pharmacy today.  Labwork: Your physician recommends that you have lab work today: bmet/lft/lipid   Testing/Procedures: -None  Follow-Up: Your physician wants you to follow-up in: 9 months at the [facility] location with Dr. Oval Linsey.  You will receive a reminder letter in the mail two months in advance. If you don't receive a letter, please call our office to schedule the follow-up appointment.   Any Other Special Instructions Will Be Listed Below (If Applicable).     If you need a refill on your cardiac medications before your next appointment, please call your pharmacy.

## 2015-08-18 NOTE — Progress Notes (Signed)
Cardiology Office Note   Date:  08/18/2015   ID:  Quinetta, Shilling [birth year], MRN [id number]  PCP:  Woody Seller, MD  Cardiologist:  Patient wished to follow-up at [facility] due to closer proximity to her home, will set up with Dr. Oval Linsey. (Formerly Dr. Mare Ferrari)   Chief Complaint  Patient presents with  . Follow-up    Formerly Dr. Mare Ferrari, will set up with Dr. Oval Linsey      History of Present Illness: Vega Stare is a 70 y.o. female who presents for six-month cardiology follow-up. She has past medical history of COPD on home O2, minimal CAD on cath 06/2013, GERD, hyperlipidemia and former tobacco use. 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 It was possible that she had coronary vasospasm. The event leading up to the cardiac cath was that she lost her car keys while shopping and got into a panic episode.  She was last seen by Dr. Mare Ferrari on 02/17/2015, it appears she was only partially compliant with her Lipitor at the time. She presents today for 6 month follow-up. According to the patient, if she takes her Lipitor every day, she will have leg cramps. I am fine with her taking her Lipitor every other day instead. She denies any recurrent chest pain, since her last cardiac catheterization in February 2015, she has only used nitroglycerin once after leaving the hospital in 2015 and has not used any more since. She only take aspirin occasionally as well since she has occasional nosebleed which she attribute to ASA use. Otherwise she denies any recent chest pain or shortness of breath out of proportion. She is on 2 L PRN home oxygen and does not appear she has a  pulmonologist, she has been followed up by Dr. Redmond Pulling. EKG today shows normal sinus rhythm without significant ST-T wave changes. We will obtain lipid panel, hepatic function panel and basic metabolic panel. Her blood pressure is well-controlled, she is on 2.5 mg of amlodipine not for her blood pressure but multiple history of coronary spasm.    Past Medical History  Diagnosis Date  . COPD (chronic obstructive pulmonary disease) ([place])     a. Home O2.  . NSTEMI (non-ST elevated myocardial infarction) ([place])     a. 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 Coronary vasospasm  . GERD (gastroesophageal reflux disease)   . Former tobacco use   . Hyperlipidemia   . Chronic respiratory failure (HCC)     a. on home O2.    Past Surgical History  Procedure Laterality Date  . Left heart catheterization with coronary angiogram N/A 06/08/2013    Procedure: LEFT HEART CATHETERIZATION WITH CORONARY ANGIOGRAM;  Surgeon: Sanda Klein, MD;  Location: [facility];  Service: Cardiovascular;  Laterality: N/A;     Current Outpatient Prescriptions  Medication Sig Dispense Refill  . albuterol (PROVENTIL HFA;VENTOLIN HFA) 108 (90 BASE) MCG/ACT inhaler Inhale 2 puffs into the lungs every 6 (six) hours as needed. For wheezing/shortness of breath    . amLODipine (NORVASC) 2.5 MG tablet Take 1 tablet (2.5 mg total) by mouth daily. 30 tablet 11  . aspirin 81 MG tablet Take 81  mg by mouth every other day.    Marland Kitchen atorvastatin (LIPITOR) 40 MG tablet Take 1 tablet (40 mg total) by mouth every other day. 15 tablet 11  . budesonide-formoterol (SYMBICORT) 160-4.5 MCG/ACT inhaler Inhale 2 puffs into the lungs 2 (two) times daily.    Marland Kitchen ipratropium-albuterol (DUONEB) 0.5-2.5 (3) MG/3ML SOLN Take 3 mLs by nebulization 2 (two) times daily.    . nitroGLYCERIN (NITROSTAT) 0.4 MG SL tablet ONE TABLET UNDER TONGUE AS NEEDED FOR CHEST PAIN EVERY 5 MINUTES FOR 3 DOSES 25  tablet 3   No current facility-administered medications for this visit.    Allergies:   Codeine; Imdur; and Prednisone    Social History:  The patient  reports that she has quit smoking. She has never used smokeless tobacco. She reports that she drinks alcohol. She reports that she does not use illicit drugs.   Family History:  The patient's family history includes Cancer in her father. There is no history of Heart attack, Stroke, or Hypertension.    ROS:  Please see the history of present illness.   Otherwise, review of systems are positive for None.   All other systems are reviewed and negative.    PHYSICAL EXAM: VS:  BP 120/82 mmHg  Pulse 74  Ht 5' 3.5" (1.613 m)  Wt 137 lb 12.8 oz (62.506 kg)  BMI 24.02 kg/m2  SpO2 98% , BMI Body mass index is 24.02 kg/(m^2). GEN: Well nourished, well developed, in no acute distress HEENT: normal Neck: no JVD, carotid bruits, or masses Cardiac: RRR; no murmurs, rubs, or gallops,no edema  Respiratory:  clear to auscultation bilaterally, normal work of breathing GI: soft, nontender, nondistended, + BS MS: no deformity or atrophy Skin: warm and dry, no rash Neuro:  Strength and sensation are intact Psych: euthymic mood, full affect   EKG:  EKG is ordered today. The ekg ordered today demonstrates normal sinus rhythm without significant ST-T wave changes.   Recent Labs: 02/17/2015: ALT 20; BUN 13; Creat 0.57; Potassium 4.2; Sodium 138    Lipid Panel    Component Value Date/Time   CHOL 163 02/17/2015 1123   TRIG 52 02/17/2015 1123   HDL 78 02/17/2015 1123   CHOLHDL 2.1 02/17/2015 1123   VLDL 10 02/17/2015 1123   LDLCALC 75 02/17/2015 1123      Wt Readings from Last 3 Encounters:  08/18/15 137 lb 12.8 oz (62.506 kg)  02/17/15 138 lb 1.9 oz (62.651 kg)  06/24/14 142 lb 6.4 oz (64.592 kg)      Other studies Reviewed: Additional studies/ records that were reviewed today include:   Echo 06/06/2013 LV EF: 50% -   55%  ------------------------------------------------------------ Indications:   Chest pain 786.51.  ------------------------------------------------------------ History:  PMH:  Chronic obstructive pulmonary disease. Risk factors: NSTEMI.  ------------------------------------------------------------ Study Conclusions  Left ventricle: The cavity size was normal. Systolic function was normal. The estimated ejection fraction was in the range of 50% to 55%. Regional wall motion abnormalities cannot be excluded. Doppler parameters are consistent with abnormal left ventricular relaxation (grade 1 diastolic dysfunction).         Cath 06/08/2013 IMPRESSIONS:  Minimal coronary atherosclerosis. Intramyocardial segment of the mid LAD artery No obvious culprit lesion for NSTEMI. Normal LV wall motion and overall EF.  RECOMMENDATION:  Medical therapy of NSTEMI and mild CAD. Consider empirical therapy for coronary spasm, although this was not documented during this procedure.   Review of the above records demonstrates:   Patient had minimal CAD on  previous cath in 06/2013, normal echo in January 2015. She presents today for 6 month follow-up.    ASSESSMENT AND PLAN:  1. History of coronary spasm: Well controlled on amlodipine  2. Minimal CAD on cath 06/2013: Denies any obvious anginal symptoms. Only partially compliant with aspirin due to concern for recurrent nosebleed  3. Hyperlipidemia on Lipitor, will obtain fasting LFT and lipid test today  4. COPD on 2 L home oxygen since she was 70 years of age    Current medicines are reviewed at length with the patient today.  The patient does not have concerns regarding medicines.  The following changes have been made:  no change  Labs/ tests ordered today include:   Orders Placed This Encounter  Procedures  . Basic Metabolic Panel (BMET)  . Hepatic function panel  . Lipid Profile  . EKG 12-Lead     Disposition:    FU with Dr. Oval Linsey in 9 months  Signed, [name], [place]  08/18/2015 10:27 AM    [facility] [place], [place], [place]  [address] Phone: [phone]; Fax: [phone]

## 2016-01-19 ENCOUNTER — Other Ambulatory Visit: Payer: Self-pay | Admitting: Family Medicine

## 2016-01-19 DIAGNOSIS — R9389 Abnormal findings on diagnostic imaging of other specified body structures: Secondary | ICD-10-CM

## 2016-01-19 DIAGNOSIS — J984 Other disorders of lung: Secondary | ICD-10-CM

## 2016-01-19 DIAGNOSIS — Z1231 Encounter for screening mammogram for malignant neoplasm of breast: Secondary | ICD-10-CM

## 2016-01-19 DIAGNOSIS — M810 Age-related osteoporosis without current pathological fracture: Secondary | ICD-10-CM

## 2016-01-26 ENCOUNTER — Ambulatory Visit
Admission: RE | Admit: 2016-01-26 | Discharge: 2016-01-26 | Disposition: A | Discharge status: Home / Self Care | Payer: Medicare Other | Source: Ambulatory Visit | Attending: Family Medicine | Admitting: Family Medicine

## 2016-01-26 DIAGNOSIS — R9389 Abnormal findings on diagnostic imaging of other specified body structures: Secondary | ICD-10-CM

## 2016-01-26 DIAGNOSIS — J984 Other disorders of lung: Secondary | ICD-10-CM

## 2016-01-27 ENCOUNTER — Other Ambulatory Visit: Payer: Medicare Other

## 2016-01-27 ENCOUNTER — Ambulatory Visit: Payer: Medicare Other

## 2016-02-15 ENCOUNTER — Encounter: Payer: Self-pay | Admitting: Internal Medicine

## 2016-02-15 ENCOUNTER — Ambulatory Visit (INDEPENDENT_AMBULATORY_CARE_PROVIDER_SITE_OTHER): Payer: Medicare Other | Admitting: Internal Medicine

## 2016-02-15 ENCOUNTER — Other Ambulatory Visit: Payer: Self-pay | Admitting: Internal Medicine

## 2016-02-15 VITALS — BP 130/80 | HR 77 | Ht 63.0 in | Wt 119.0 lb

## 2016-02-15 DIAGNOSIS — R918 Other nonspecific abnormal finding of lung field: Secondary | ICD-10-CM | POA: Diagnosis not present

## 2016-02-15 DIAGNOSIS — J9611 Chronic respiratory failure with hypoxia: Secondary | ICD-10-CM | POA: Diagnosis not present

## 2016-02-15 DIAGNOSIS — J449 Chronic obstructive pulmonary disease, unspecified: Secondary | ICD-10-CM

## 2016-02-15 MED ORDER — ALPRAZOLAM 0.5 MG PO TABS: 0.5000 mg | ORAL_TABLET | ORAL | 0 refills | Status: DC

## 2016-02-15 NOTE — Progress Notes (Signed)
Subjective:     Patient ID: Brooke [name], female   DOB: 06/22/1945,    MRN: [id number]  HPI   63 yowf quit smoking 2009 on 02 noct 2012 maint on symbicort 160 since aorund  2014 referred to pulmonary clinic 02/15/2016 by Dr   Kathryne Eriksson for RUL Lung mass   02/15/2016 1st [facility] office visit/ Madisan Bice  GOLD IV copd/ symb 160 2bid maint  Chief Complaint  Patient presents with  . Pulmonary Consult    referred by Dr. Kathryne Eriksson for COPD.  MMRC2 = can't walk a nl pace on a flat grade s sob but does fine slow and flat eg [employer]/ [employer] on 2lpm  New onset bloody mucus plugs November 11 2015 assoc with wt loss   02 2lpm hs and with walking / rare saba needed   No obvious day to day or daytime variability or assoc cp or chest tightness, subjective wheeze or overt sinus or hb symptoms. No unusual exp hx or h/o childhood pna/ asthma or knowledge of premature birth.  Sleeping ok without nocturnal  or early am exacerbation  of respiratory  c/o's or need for noct saba. Also denies any obvious fluctuation of symptoms with weather or environmental changes or other aggravating or alleviating factors except as outlined above   Current Medications, Allergies, Complete Past Medical History, Past Surgical History, Family History, and Social History were reviewed in [employer] record.  ROS  The following are not active complaints unless bolded sore throat, dysphagia, dental problems, itching, sneezing,  nasal congestion or excess/ purulent secretions, ear ache,   fever, chills, sweats, unintended wt loss, classically pleuritic or exertional cp,  orthopnea pnd or leg swelling, presyncope, palpitations, abdominal pain, anorexia, nausea, vomiting, diarrhea  or change in bowel or bladder habits, change in stools or urine, dysuria,hematuria,  rash, arthralgias, visual complaints, headache, numbness, weakness or ataxia or problems with walking or coordination,  change in  mood/affect or memory.               Review of Systems     Objective:   Physical Exam    amb pleasant wf nad  Wt Readings from Last 3 Encounters:  02/15/16 119 lb (54 kg)  08/18/15 137 lb 12.8 oz (62.5 kg)  02/17/15 138 lb 1.9 oz (62.7 kg)    Vital signs reviewed - Note on arrival 02 sats  98% on  2lpm       HEENT: nl dentition, turbinates, and oropharynx. Nl external ear canals without cough reflex   NECK :  without JVD/Nodes/TM/ nl carotid upstrokes bilaterally   LUNGS: no acc muscle use,   Barrel chest/ distant bs s localized wheeze/ rhonchi   CV:  RRR  no s3 or murmur or increase in P2, no edema   ABD:  soft and nontender with pos Hoover's early to mid inspiration in the supine position. No bruits or organomegaly, bowel sounds nl  MS:  Nl gait/ ext warm without deformities, calf tenderness, cyanosis or clubbing No obvious joint restrictions   SKIN: warm and dry without lesions    NEURO:  alert, approp, nl sensorium with  no motor deficits      I personally reviewed images and agree with radiology impression as follows:  CT Chest  W/o contrast    01/26/16  1. There is irregular lobulated mass in left upper lobe suprahilar region extending from left hilum. This lesion measures at least 4.5 x 4.4 cm.  There is linear extension of the lesion to left upper lobe parietal pleura. This is highly suspicious for primary malignancy such as bronchogenic carcinoma. Small satellite nodules are noted in left upper lobe suspicious for metastatic disease. Further evaluation with PET scan and/or biopsy is recommended. Pulmonology consult commended. 2. There is mediastinal adenopathy suspicious for metastatic disease. 3. Indeterminate low-density lesion in right hepatic lobe measures 1 cm. Metastatic disease cannot be excluded. 4. No segmental infiltrate or pulmonary edema. Emphysematous changes are again noted bilaterally.    Assessment:

## 2016-02-15 NOTE — Patient Instructions (Addendum)
For cough > mucinex up to 1200 mg every 12 hours as needed  Please see patient coordinator before you leave today  to schedule PET scheduled and I will call you the results and decide re whether to recommend bronchoscopy or an alternative   Should we decide to go forward with the bronchoscopy:  Come to outpatient registration at [facility] (behind the ER) at 715 am   with nothing to eat or drink after midnight  The night before for Bronchoscopy

## 2016-02-15 NOTE — Assessment & Plan Note (Signed)
Discussed in detail all the  indications, usual  risks and alternatives  relative to the benefits with patient who agrees to proceed with w/u first with PET then tissue dx via easiest access   Total time devoted to counseling  = 35/38mreview case with pt/ discussion of options/alternatives/ personally creating written instructions  in presence of pt  then going over those specific  Instructions directly with the pt including how to use all of the meds but in particular covering each new medication in detail and the difference between the maintenance/automatic meds and the prns using an action plan format for the latter.

## 2016-02-15 NOTE — Assessment & Plan Note (Signed)
Spirometry 02/15/2016  FEV1 0.60 (28%)  Ratio 38 with classic curvature  p am symbicort 160

## 2016-02-15 NOTE — Assessment & Plan Note (Signed)
rx 2lpm hs and prn daytime since 2014   Adequate control on present rx, reviewed > no change in rx needed

## 2016-02-20 ENCOUNTER — Telehealth: Payer: Self-pay | Admitting: Internal Medicine

## 2016-02-20 MED ORDER — HYDROXYZINE PAMOATE 50 MG PO CAPS: ORAL_CAPSULE | ORAL | 0 refills | Status: DC

## 2016-02-20 NOTE — Telephone Encounter (Signed)
Spoke with pt. She is aware of MW's recommendation. Rx has been sent in. Nothing further was needed.

## 2016-02-20 NOTE — Telephone Encounter (Signed)
Vistaril 50 mg one hour before ok to repeat x one though will make her sleepy so someone should drive her home

## 2016-02-20 NOTE — Telephone Encounter (Signed)
Spoke with pt. She is scheduled for a PET scan on 02/28/16. Pt is claustrophobic and will need some medication to help with this. Has taken Xanax in the past and this made her vomit.  MW - please advise. Thanks.

## 2016-02-28 ENCOUNTER — Encounter (HOSPITAL_COMMUNITY)
Admission: RE | Admit: 2016-02-28 | Discharge: 2016-02-28 | Disposition: A | Discharge status: Home / Self Care | Payer: Medicare Other | Source: Ambulatory Visit | Attending: Internal Medicine | Admitting: Internal Medicine

## 2016-02-28 DIAGNOSIS — R918 Other nonspecific abnormal finding of lung field: Secondary | ICD-10-CM | POA: Insufficient documentation

## 2016-02-28 LAB — GLUCOSE, CAPILLARY: Glucose-Capillary: 107 mg/dL — ABNORMAL HIGH (ref 65–99)

## 2016-02-28 MED ORDER — FLUDEOXYGLUCOSE F - 18 (FDG) INJECTION
5.9800 | Freq: Once | INTRAVENOUS | Status: AC | PRN
  Administered 2016-02-28: 5.98 via INTRAVENOUS

## 2016-03-02 ENCOUNTER — Other Ambulatory Visit: Payer: Self-pay

## 2016-03-02 DIAGNOSIS — R918 Other nonspecific abnormal finding of lung field: Secondary | ICD-10-CM

## 2016-03-05 ENCOUNTER — Telehealth: Payer: Self-pay | Admitting: Pulmonary Disease

## 2016-03-05 NOTE — Telephone Encounter (Signed)
I had a conversation with Brooke [name] to go over the plan for her to have an EBUS bronchoscopy later this week.  She has been experiencing significant second thoughts about the procedure because she thinks that she may not want to have the cancer treatment.  I advised that the only way to know what type of cancer were dealing with and how to treat it would be to obtain a biopsy. I also advised that performing the procedure would be low risk and that I believe we would be able to get an answer from the procedure. I also advised that new her generation lung cancer treatment therapies are better tolerated than the frame of reference she has from her own father who died from lung cancer.  However, and are extensive conversation she made it clear that she's not sure that she wants to have this treated.  I advised that she call her primary care physician schedule an appointment to discuss this further. After that follow-up with either myself or Dr. Melvyn Novas in clinic and then will make a decision how to proceed.

## 2016-03-09 ENCOUNTER — Encounter (HOSPITAL_COMMUNITY): Admission: RE | Payer: Self-pay | Source: Ambulatory Visit

## 2016-03-09 ENCOUNTER — Ambulatory Visit (HOSPITAL_COMMUNITY): Admission: RE | Admit: 2016-03-09 | Payer: Medicare Other | Source: Ambulatory Visit | Admitting: Pulmonary Disease

## 2016-03-09 ENCOUNTER — Encounter (HOSPITAL_COMMUNITY): Payer: Medicare Other

## 2016-03-09 SURGERY — ENDOBRONCHIAL ULTRASOUND (EBUS)
Anesthesia: General | Laterality: Bilateral

## 2016-03-19 ENCOUNTER — Encounter (HOSPITAL_COMMUNITY): Payer: Medicare Other

## 2016-03-19 ENCOUNTER — Encounter (HOSPITAL_COMMUNITY): Payer: Self-pay

## 2016-03-19 ENCOUNTER — Ambulatory Visit (HOSPITAL_COMMUNITY): Admit: 2016-03-19 | Payer: Medicare Other | Admitting: Pulmonary Disease

## 2016-03-19 SURGERY — ENDOBRONCHIAL ULTRASOUND (EBUS)
Anesthesia: General | Laterality: Bilateral

## 2016-09-16 ENCOUNTER — Other Ambulatory Visit: Payer: Self-pay | Admitting: Physician Assistant

## 2016-09-17 NOTE — Telephone Encounter (Signed)
Rx(s) sent to pharmacy electronically.  

## 2016-09-17 NOTE — Telephone Encounter (Signed)
This is Dr. Oval Linsey pt.

## 2016-09-17 NOTE — Telephone Encounter (Signed)
Please review for refill. Thanks!  

## 2016-09-28 ENCOUNTER — Encounter: Payer: Self-pay | Admitting: Physician Assistant

## 2016-09-28 ENCOUNTER — Ambulatory Visit (INDEPENDENT_AMBULATORY_CARE_PROVIDER_SITE_OTHER): Payer: Medicare Other | Admitting: Physician Assistant

## 2016-09-28 VITALS — BP 116/78 | HR 88 | Ht 63.0 in | Wt 130.0 lb

## 2016-09-28 DIAGNOSIS — E785 Hyperlipidemia, unspecified: Secondary | ICD-10-CM | POA: Diagnosis not present

## 2016-09-28 DIAGNOSIS — Z79899 Other long term (current) drug therapy: Secondary | ICD-10-CM | POA: Diagnosis not present

## 2016-09-28 DIAGNOSIS — J42 Unspecified chronic bronchitis: Secondary | ICD-10-CM

## 2016-09-28 DIAGNOSIS — I2510 Atherosclerotic heart disease of native coronary artery without angina pectoris: Secondary | ICD-10-CM

## 2016-09-28 DIAGNOSIS — R918 Other nonspecific abnormal finding of lung field: Secondary | ICD-10-CM

## 2016-09-28 MED ORDER — AMLODIPINE BESYLATE 2.5 MG PO TABS: 2.5000 mg | ORAL_TABLET | Freq: Every day | ORAL | 3 refills | Status: DC

## 2016-09-28 MED ORDER — ATORVASTATIN CALCIUM 20 MG PO TABS: 20.0000 mg | ORAL_TABLET | Freq: Every day | ORAL | 3 refills | Status: DC

## 2016-09-28 NOTE — Progress Notes (Signed)
Cardiology Office Note    Date:  09/28/2016   ID:  Brooke [name], Brooke [name] [birth year], MRN [id number]  PCP:  [name], MD  Cardiologist:  Plan to set up with Dr. Oval Linsey in 2019 for annual visit. [name] PA-C  Chief Complaint  Patient presents with  . Follow-up    1 YEAR. To be set up with Dr. Oval Linsey  . Headache    History of Present Illness:  Brooke [name] is a 71 y.o. female with PMH of COPD on home O2, minimal CAD on cath 06/2013, GERD, hyperlipidemia and former tobacco use. 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 It was possible that she had coronary vasospasm. The event leading up to the cardiac cath was that she lost her car keys while shopping and got into a panic episode.  She was previously followed by Dr. Mare Ferrari, I saw the patient in the [place] office on 08/18/2015, she expressed wishes to be established at [facility] office as it is closer to her home. She was also taking Lipitor every other day as she would have leg cramps when take Lipitor on a daily basis. She denies any recurrent chest discomfort. She is on amlodipine for coronary spasm. I recommended her to be established with Dr. Oval Linsey in 9 months, however it appears that did not happen. She presents today for 1 year follow-up. Since then, she has been established with pulmonology service in October 2017 after diagnosed by her primary care physician of a right upper lobe lung mass. She was scheduled for bronchoscopy in November, it does not appears she went through with them. Based on the recent office note by her primary care physician, it appears she has declined treatment.  Patient presents today  for cardiology office visit. She has been feeling fine. She denies any significant chest pain. The last time she used nitroglycerin was close to a year ago. Despite right upper lobe lung mass that was diagnosed last year, she says she has been feeling fine. She does not want to pursue any further treatment. She also decreased her Lipitor to once every third day, she wanted to know if the she should continue the Lipitor at this time. I did recommend her to continue on the Lipitor, despite the fact her lipid panel has been looking good, previous cardiac catheterization did reveal coronary atherosclerosis. However given her concern of leg cramps, I will decrease her Lipitor to 20 mg daily. She will need a fasting lipid panel and LFTs in 2 months. As far as amlodipine, she will continue on the 2.5 mg amlodipine daily. She is aware her amlodipine is not for blood pressure, it is to decrease episodes of coronary spasm.   Past Medical History:  Diagnosis Date  . Chronic respiratory failure (HCC)    a. on home O2.  Marland Kitchen COPD (chronic obstructive pulmonary disease) ([place])    a. Home O2.  . Former tobacco use   . GERD (gastroesophageal reflux disease)   . Hyperlipidemia   . NSTEMI (non-ST elevated myocardial infarction) ([place])    a. 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 Coronary vasospasm    Past  Surgical History:  Procedure Laterality Date  . LEFT HEART CATHETERIZATION WITH CORONARY ANGIOGRAM N/A 06/08/2013   Procedure: LEFT HEART CATHETERIZATION WITH CORONARY ANGIOGRAM;  Surgeon: Sanda Klein, MD;  Location: [facility] CATH LAB;  Service: Cardiovascular;  Laterality: N/A;    Current Medications: Outpatient Medications Prior to Visit  Medication Sig Dispense Refill  . albuterol (PROVENTIL HFA;VENTOLIN HFA) 108 (90 BASE) MCG/ACT inhaler Inhale 2 puffs into the lungs every 6 (six) hours as needed. For wheezing/shortness of breath    .  budesonide-formoterol (SYMBICORT) 160-4.5 MCG/ACT inhaler Inhale 2 puffs into the lungs 2 (two) times daily.    Marland Kitchen ipratropium-albuterol (DUONEB) 0.5-2.5 (3) MG/3ML SOLN Take 3 mLs by nebulization 2 (two) times daily.    . nitroGLYCERIN (NITROSTAT) 0.4 MG SL tablet ONE TABLET UNDER TONGUE AS NEEDED FOR CHEST PAIN EVERY 5 MINUTES FOR 3 DOSES 25 tablet 3  . ALPRAZolam (XANAX) 0.5 MG tablet Take 1 tablet (0.5 mg total) by mouth 30 (thirty) minutes before procedure. 1 tablet 0  . amLODipine (NORVASC) 2.5 MG tablet Take 1 tablet (2.5 mg total) by mouth daily. PLEASE CONTACT OFFICE FOR ADDITIONAL REFILLS 15 tablet 0  . atorvastatin (LIPITOR) 40 MG tablet Take 1 tablet (40 mg total) by mouth every other day. 15 tablet 11  . hydrOXYzine (VISTARIL) 50 MG capsule Take 1 tablet by mouth 1 hour before your PET scan 2 capsule 0   No facility-administered medications prior to visit.      Allergies:   Codeine; Imdur [isosorbide dinitrate]; and Prednisone   Social History   Social History  . Marital status: Divorced    Spouse name: N/A  . Number of children: N/A  . Years of education: N/A   Social History Main Topics  . Smoking status: Former Smoker    Packs/day: 1.00    Years: 30.00    Types: Cigarettes    Quit date: 05/08/2007  . Smokeless tobacco: Never Used  . Alcohol use Yes     Comment: rarely  . Drug use: No  . Sexual activity: Not Asked   Other Topics Concern  . None   Social History Narrative  . None     Family History:  The patient's family history includes Cancer in her father.   ROS:   Please see the history of present illness.    ROS All other systems reviewed and are negative.   PHYSICAL EXAM:   VS:  BP 116/78   Pulse 88   Ht 5\' 3"  (1.6 m)   Wt 130 lb (59 kg)   BMI 23.03 kg/m    GEN: Well nourished, well developed, in no acute distress  HEENT: normal  Neck: no JVD, carotid bruits, or masses Cardiac: RRR; no murmurs, rubs, or gallops,no edema  Respiratory:  clear  to auscultation bilaterally, normal work of breathing GI: soft, nontender, nondistended, + BS MS: no deformity or atrophy  Skin: warm and dry, no rash Neuro:  Alert and Oriented x 3, Strength and sensation are intact Psych: euthymic mood, full affect  Wt Readings from Last 3 Encounters:  09/28/16 130 lb (59 kg)  02/15/16 119 lb (54 kg)  08/18/15 137 lb 12.8 oz (62.5 kg)      Studies/Labs Reviewed:   EKG:  EKG is ordered today.  The ekg ordered today demonstrates Normal sinus rhythm without significant ST-T wave changes.  Recent Labs: No results found for requested labs within last 8760 hours.   Lipid Panel    Component Value Date/Time  CHOL 166 08/18/2015 1041   TRIG 58 08/18/2015 1041   HDL 76 08/18/2015 1041   CHOLHDL 2.2 08/18/2015 1041   VLDL 12 08/18/2015 1041   LDLCALC 78 08/18/2015 1041    Additional studies/ records that were reviewed today include:   Echo 06/06/2013 LV EF: 50% -  55%  Left ventricle: The cavity size was normal. Systolic function was normal. The estimated ejection fraction was in the range of 50% to 55%. Regional wall motion abnormalities cannot be excluded. Doppler parameters are consistent with abnormal left ventricular relaxation (grade 1 diastolic dysfunction).      ASSESSMENT:    1. Coronary artery disease involving native coronary artery of native heart without angina pectoris   2. Hyperlipidemia, unspecified hyperlipidemia type   3. Medication management   4. Chronic bronchitis, unspecified chronic bronchitis type ([place])   5. Mass of upper lobe of right lung      PLAN:  In order of problems listed above:  1. Nonobstructive CAD: Seen on cath in 2015, 30% RCA and first diagonal, had mild myocardial bridging in the LAD area. Currently on amlodipine for the vasodilatory effect and to decrease chance of coronary spasm. I will try to set her up with Dr. Oval Linsey who will become her primary cardiologist starting next  year.  2. Hyperlipidemia: Unable to tolerate higher dose of Lipitor, her lipid panel has been very good. She takes Lipitor 40 mg every other day, for the past year, she has decreased this to 40 mg every 3 days. I did recommend continue on the Lipitor. To allow more scheduled intake and avoid muscle cramps, I recommended 20 mg daily instead. She will need a fasting lipid panel and LFTs.  3. COPD on 2 L home O2: followed by Dr. Melvyn Novas  4. RUL mass: Diagnosed in 2017, refused further workup including bronchoscopy.    Medication Adjustments/Labs and Tests Ordered: Current medicines are reviewed at length with the patient today.  Concerns regarding medicines are outlined above.  Medication changes, Labs and Tests ordered today are listed in the Patient Instructions below. Patient Instructions  Medication Instructions:  DECREASE- Atorvastatin 20 mg daily  Labwork: Fasting Lipids Liver  Testing/Procedures: None Ordered  Follow-Up: Your physician wants you to follow-up in: 1 Year Dr Oval Linsey. You will receive a reminder letter in the mail two months in advance. If you don't receive a letter, please call our office to schedule the follow-up appointment.   Any Other Special Instructions Will Be Listed Below (If Applicable).   If you need a refill on your cardiac medications before your next appointment, please call your pharmacy.      Hilbert Corrigan, [place]  09/28/2016 4:07 PM    [facility] [place], [place], [place]  [address] Phone: [phone]; Fax: [phone]

## 2016-09-28 NOTE — Patient Instructions (Addendum)
Medication Instructions:  DECREASE- Atorvastatin 20 mg daily  Labwork: Fasting Lipids Liver  Testing/Procedures: None Ordered  Follow-Up: Your physician wants you to follow-up in: 1 Year Dr Oval Linsey. You will receive a reminder letter in the mail two months in advance. If you don't receive a letter, please call our office to schedule the follow-up appointment.   Any Other Special Instructions Will Be Listed Below (If Applicable).   If you need a refill on your cardiac medications before your next appointment, please call your pharmacy.

## 2016-10-05 DIAGNOSIS — C801 Malignant (primary) neoplasm, unspecified: Secondary | ICD-10-CM

## 2016-10-05 HISTORY — DX: Malignant (primary) neoplasm, unspecified: C80.1

## 2016-10-22 ENCOUNTER — Emergency Department (HOSPITAL_COMMUNITY): Payer: Medicare Other

## 2016-10-22 ENCOUNTER — Encounter (HOSPITAL_COMMUNITY): Payer: Self-pay

## 2016-10-22 ENCOUNTER — Inpatient Hospital Stay (HOSPITAL_COMMUNITY)
Admission: EM | Admit: 2016-10-22 | Discharge: 2016-11-02 | DRG: 193 | Disposition: A | Discharge status: Skilled Nursing Facility | Payer: Medicare Other | Attending: Family Medicine | Admitting: Family Medicine

## 2016-10-22 DIAGNOSIS — E876 Hypokalemia: Secondary | ICD-10-CM | POA: Diagnosis present

## 2016-10-22 DIAGNOSIS — Z79899 Other long term (current) drug therapy: Secondary | ICD-10-CM | POA: Diagnosis not present

## 2016-10-22 DIAGNOSIS — J9611 Chronic respiratory failure with hypoxia: Secondary | ICD-10-CM | POA: Diagnosis present

## 2016-10-22 DIAGNOSIS — E43 Unspecified severe protein-calorie malnutrition: Secondary | ICD-10-CM | POA: Insufficient documentation

## 2016-10-22 DIAGNOSIS — Z888 Allergy status to other drugs, medicaments and biological substances status: Secondary | ICD-10-CM

## 2016-10-22 DIAGNOSIS — E860 Dehydration: Secondary | ICD-10-CM | POA: Diagnosis present

## 2016-10-22 DIAGNOSIS — Z801 Family history of malignant neoplasm of trachea, bronchus and lung: Secondary | ICD-10-CM

## 2016-10-22 DIAGNOSIS — J181 Lobar pneumonia, unspecified organism: Secondary | ICD-10-CM

## 2016-10-22 DIAGNOSIS — E785 Hyperlipidemia, unspecified: Secondary | ICD-10-CM | POA: Diagnosis present

## 2016-10-22 DIAGNOSIS — R918 Other nonspecific abnormal finding of lung field: Secondary | ICD-10-CM

## 2016-10-22 DIAGNOSIS — D509 Iron deficiency anemia, unspecified: Secondary | ICD-10-CM | POA: Diagnosis present

## 2016-10-22 DIAGNOSIS — J95811 Postprocedural pneumothorax: Secondary | ICD-10-CM

## 2016-10-22 DIAGNOSIS — J441 Chronic obstructive pulmonary disease with (acute) exacerbation: Secondary | ICD-10-CM | POA: Diagnosis present

## 2016-10-22 DIAGNOSIS — R0609 Other forms of dyspnea: Secondary | ICD-10-CM | POA: Diagnosis not present

## 2016-10-22 DIAGNOSIS — J984 Other disorders of lung: Secondary | ICD-10-CM | POA: Diagnosis not present

## 2016-10-22 DIAGNOSIS — K219 Gastro-esophageal reflux disease without esophagitis: Secondary | ICD-10-CM | POA: Diagnosis present

## 2016-10-22 DIAGNOSIS — J189 Pneumonia, unspecified organism: Secondary | ICD-10-CM | POA: Diagnosis present

## 2016-10-22 DIAGNOSIS — J9601 Acute respiratory failure with hypoxia: Secondary | ICD-10-CM

## 2016-10-22 DIAGNOSIS — J449 Chronic obstructive pulmonary disease, unspecified: Secondary | ICD-10-CM | POA: Diagnosis not present

## 2016-10-22 DIAGNOSIS — J9622 Acute and chronic respiratory failure with hypercapnia: Secondary | ICD-10-CM | POA: Diagnosis present

## 2016-10-22 DIAGNOSIS — J440 Chronic obstructive pulmonary disease with acute lower respiratory infection: Secondary | ICD-10-CM | POA: Diagnosis present

## 2016-10-22 DIAGNOSIS — Z87891 Personal history of nicotine dependence: Secondary | ICD-10-CM | POA: Diagnosis not present

## 2016-10-22 DIAGNOSIS — C7889 Secondary malignant neoplasm of other digestive organs: Secondary | ICD-10-CM | POA: Diagnosis present

## 2016-10-22 DIAGNOSIS — I2510 Atherosclerotic heart disease of native coronary artery without angina pectoris: Secondary | ICD-10-CM | POA: Diagnosis present

## 2016-10-22 DIAGNOSIS — Z9981 Dependence on supplemental oxygen: Secondary | ICD-10-CM

## 2016-10-22 DIAGNOSIS — J9621 Acute and chronic respiratory failure with hypoxia: Secondary | ICD-10-CM | POA: Diagnosis present

## 2016-10-22 DIAGNOSIS — R0602 Shortness of breath: Secondary | ICD-10-CM

## 2016-10-22 DIAGNOSIS — Z66 Do not resuscitate: Secondary | ICD-10-CM | POA: Diagnosis present

## 2016-10-22 DIAGNOSIS — C3412 Malignant neoplasm of upper lobe, left bronchus or lung: Secondary | ICD-10-CM | POA: Diagnosis present

## 2016-10-22 DIAGNOSIS — C779 Secondary and unspecified malignant neoplasm of lymph node, unspecified: Secondary | ICD-10-CM | POA: Diagnosis present

## 2016-10-22 DIAGNOSIS — R0600 Dyspnea, unspecified: Secondary | ICD-10-CM

## 2016-10-22 DIAGNOSIS — I252 Old myocardial infarction: Secondary | ICD-10-CM

## 2016-10-22 LAB — BASIC METABOLIC PANEL
Anion gap: 8 (ref 5–15)
BUN: 9 mg/dL (ref 6–20)
CO2: 23 mmol/L (ref 22–32)
CREATININE: 0.44 mg/dL (ref 0.44–1.00)
Calcium: 7.9 mg/dL — ABNORMAL LOW (ref 8.9–10.3)
Chloride: 108 mmol/L (ref 101–111)
GFR calc non Af Amer: >60 mL/min (ref >60)
Glucose, Bld: 122 mg/dL — ABNORMAL HIGH (ref 65–99)
POTASSIUM: 3.1 mmol/L — AB (ref 3.5–5.1)
SODIUM: 139 mmol/L (ref 135–145)

## 2016-10-22 LAB — CBC WITH DIFFERENTIAL/PLATELET
Basophils Absolute: 0 10*3/uL (ref 0.0–0.1)
Basophils Relative: 0 %
EOS ABS: 0 10*3/uL (ref 0.0–0.7)
Eosinophils Relative: 0 %
HCT: 36.1 % (ref 36.0–46.0)
HEMOGLOBIN: 11.5 g/dL — AB (ref 12.0–15.0)
LYMPHS ABS: 0.6 10*3/uL — AB (ref 0.7–4.0)
LYMPHS PCT: 5 %
MCH: 24.6 pg — AB (ref 26.0–34.0)
MCHC: 31.9 g/dL (ref 30.0–36.0)
MCV: 77.1 fL — ABNORMAL LOW (ref 78.0–100.0)
Monocytes Absolute: 0.4 10*3/uL (ref 0.1–1.0)
Monocytes Relative: 4 %
NEUTROS PCT: 91 %
Neutro Abs: 11.5 10*3/uL — ABNORMAL HIGH (ref 1.7–7.7)
Platelets: 268 10*3/uL (ref 150–400)
RBC: 4.68 MIL/uL (ref 3.87–5.11)
RDW: 15.8 % — ABNORMAL HIGH (ref 11.5–15.5)
WBC: 12.6 10*3/uL — AB (ref 4.0–10.5)

## 2016-10-22 LAB — I-STAT TROPONIN, ED: TROPONIN I, POC: 0.01 ng/mL (ref 0.00–0.08)

## 2016-10-22 LAB — PROCALCITONIN

## 2016-10-22 LAB — BRAIN NATRIURETIC PEPTIDE: B NATRIURETIC PEPTIDE 5: 40.3 pg/mL (ref 0.0–100.0)

## 2016-10-22 MED ORDER — ACETAMINOPHEN 325 MG PO TABS: 650.0000 mg | ORAL_TABLET | Freq: Four times a day (QID) | ORAL | Status: DC | PRN

## 2016-10-22 MED ORDER — AMLODIPINE BESYLATE 5 MG PO TABS
2.5000 mg | ORAL_TABLET | Freq: Every day | ORAL | Status: DC
  Administered 2016-10-23 – 2016-11-02 (×11): 2.5 mg via ORAL
  Filled 2016-10-22 (×11): qty 1

## 2016-10-22 MED ORDER — DEXTROSE 5 % IV SOLN
1.0000 g | INTRAVENOUS | Status: DC
  Administered 2016-10-23 – 2016-10-27 (×5): 1 g via INTRAVENOUS
  Filled 2016-10-22 (×6): qty 10

## 2016-10-22 MED ORDER — POTASSIUM CHLORIDE IN NACL 20-0.9 MEQ/L-% IV SOLN
INTRAVENOUS | Status: DC
  Administered 2016-10-22 – 2016-10-29 (×11): via INTRAVENOUS
  Administered 2016-10-30: 1000 mL via INTRAVENOUS
  Administered 2016-10-30 – 2016-11-02 (×5): via INTRAVENOUS
  Filled 2016-10-22 (×23): qty 1000

## 2016-10-22 MED ORDER — DEXTROSE 5 % IV SOLN
500.0000 mg | INTRAVENOUS | Status: DC
  Administered 2016-10-23 – 2016-10-24 (×2): 500 mg via INTRAVENOUS
  Filled 2016-10-22 (×2): qty 500

## 2016-10-22 MED ORDER — ACETAMINOPHEN 650 MG RE SUPP: 650.0000 mg | Freq: Four times a day (QID) | RECTAL | Status: DC | PRN

## 2016-10-22 MED ORDER — IPRATROPIUM BROMIDE 0.02 % IN SOLN: 0.5000 mg | Freq: Four times a day (QID) | RESPIRATORY_TRACT | Status: DC

## 2016-10-22 MED ORDER — ONDANSETRON HCL 4 MG PO TABS
4.0000 mg | ORAL_TABLET | Freq: Four times a day (QID) | ORAL | Status: DC | PRN
  Administered 2016-10-26 – 2016-10-29 (×2): 4 mg via ORAL
  Filled 2016-10-22 (×3): qty 1

## 2016-10-22 MED ORDER — ALBUTEROL SULFATE (2.5 MG/3ML) 0.083% IN NEBU: 2.5000 mg | INHALATION_SOLUTION | Freq: Four times a day (QID) | RESPIRATORY_TRACT | Status: DC

## 2016-10-22 MED ORDER — DEXTROSE 5 % IV SOLN
500.0000 mg | Freq: Once | INTRAVENOUS | Status: AC
  Administered 2016-10-22: 500 mg via INTRAVENOUS
  Filled 2016-10-22 (×2): qty 500

## 2016-10-22 MED ORDER — DEXTROSE 5 % IV SOLN
1.0000 g | Freq: Once | INTRAVENOUS | Status: AC
  Administered 2016-10-22: 1 g via INTRAVENOUS
  Filled 2016-10-22: qty 10

## 2016-10-22 MED ORDER — ENOXAPARIN SODIUM 40 MG/0.4ML ~~LOC~~ SOLN
40.0000 mg | SUBCUTANEOUS | Status: DC
  Administered 2016-10-22 – 2016-10-30 (×9): 40 mg via SUBCUTANEOUS
  Filled 2016-10-22 (×9): qty 0.4

## 2016-10-22 MED ORDER — ONDANSETRON HCL 4 MG/2ML IJ SOLN: 4.0000 mg | Freq: Four times a day (QID) | INTRAMUSCULAR | Status: DC | PRN

## 2016-10-22 MED ORDER — ALBUTEROL SULFATE (2.5 MG/3ML) 0.083% IN NEBU
2.5000 mg | INHALATION_SOLUTION | RESPIRATORY_TRACT | Status: DC | PRN
  Administered 2016-10-22 – 2016-10-29 (×2): 2.5 mg via RESPIRATORY_TRACT
  Filled 2016-10-22 (×2): qty 3

## 2016-10-22 MED ORDER — ATORVASTATIN CALCIUM 20 MG PO TABS
20.0000 mg | ORAL_TABLET | Freq: Every day | ORAL | Status: DC
  Administered 2016-10-22 – 2016-11-01 (×11): 20 mg via ORAL
  Filled 2016-10-22 (×11): qty 1

## 2016-10-22 MED ORDER — MOMETASONE FURO-FORMOTEROL FUM 200-5 MCG/ACT IN AERO
2.0000 | INHALATION_SPRAY | Freq: Two times a day (BID) | RESPIRATORY_TRACT | Status: DC
  Administered 2016-10-22 – 2016-10-23 (×2): 2 via RESPIRATORY_TRACT
  Filled 2016-10-22: qty 8.8

## 2016-10-22 MED ORDER — IPRATROPIUM-ALBUTEROL 0.5-2.5 (3) MG/3ML IN SOLN
3.0000 mL | Freq: Once | RESPIRATORY_TRACT | Status: AC
  Administered 2016-10-22: 3 mL via RESPIRATORY_TRACT
  Filled 2016-10-22: qty 3

## 2016-10-22 MED ORDER — IPRATROPIUM-ALBUTEROL 0.5-2.5 (3) MG/3ML IN SOLN
3.0000 mL | Freq: Four times a day (QID) | RESPIRATORY_TRACT | Status: DC
  Administered 2016-10-22 – 2016-10-27 (×19): 3 mL via RESPIRATORY_TRACT
  Filled 2016-10-22 (×19): qty 3

## 2016-10-22 NOTE — ED Triage Notes (Addendum)
Per EMS, pt from home.  Pt c/o SOB since Thursday.  Pt got up today and walked to door.  Pt wheezing and unable to catch her breath.  Pt has had 10 albuterol.  .5 atrovent.  Vitals:  Hr 120, 142/98, hr 97% with tx, resp 22  20g IV rt wrist.  125mg  solumedrol in route

## 2016-10-22 NOTE — Progress Notes (Signed)
PHARMACY NOTE -  CEFTRIAXONE & AZITHROMYCIN  Pharmacy has been consulted to assist with dosing of Azithromycin and Ceftriaxone for CAP.  Need for further dosage adjustment appears unlikely at present.    PLAN: Ceftriaxone 1gm IV q24h Azithromycin 500mg  IV q24h  Will sign off at this time.  Please reconsult if a change in clinical status warrants re-evaluation of dosage.  Leone Haven, PharmD 10/22/16 @ 19:29

## 2016-10-22 NOTE — ED Provider Notes (Signed)
Emergency Department Provider Note   I have reviewed the triage vital signs and the nursing notes.   HISTORY  Chief Complaint Shortness of Breath   HPI Brooke [name] is a 71 y.o. female with PMH of chronic respiratory failure, COPD, GERD, HLD, and prior NSTEMI presents to the emergency department for evaluation of progressively worsening difficulty breathing and chest pressure over the past 3 days. Patient states she went for a stress test on Friday and did a lot of walking during an appointment. Since then she felt more shortness of breath and some intermittent chest pressure. She's had prior heart attacks and states it does not feel similar to that. She continued using her COPD medications including twice daily do not with little to no relief. No fevers or shaking chills. Symptoms worsened again this morning and she took a rescue inhaler after speaking with paramedics. The ambulance arrived her house and gave 125 of Solu-Medrol along with additional breathing treatment. She does not feel much better after this. She is on 2 L of oxygen at home. No radiation of symptoms. Breathing improved with laying on the right side.    Past Medical History:  Diagnosis Date  . Chronic respiratory failure (HCC)    a. on home O2.  Marland Kitchen COPD (chronic obstructive pulmonary disease) ([place])    a. Home O2.  . Former tobacco use   . GERD (gastroesophageal reflux disease)   . Hyperlipidemia   . NSTEMI (non-ST elevated myocardial infarction) ([place])    a. 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 Coronary vasospasm    Patient Active Problem List   Diagnosis Date Noted  . Community acquired pneumonia of left upper lobe of lung ([place]) 10/22/2016  . COPD exacerbation ([place]) 10/22/2016  . Hypokalemia 10/22/2016  . Microcytic anemia 10/22/2016  . Mass of upper lobe of left lung 02/15/2016  . Chronic respiratory failure with hypoxia ([place])  02/15/2016  . GERD (gastroesophageal reflux disease)   . Former tobacco use   . HLD (hyperlipidemia) 06/08/2013  . NSTEMI (non-ST elevated myocardial infarction) ([place]) 06/05/2013  . COPD  GOLD IV  06/05/2013    Past Surgical History:  Procedure Laterality Date  . LEFT HEART CATHETERIZATION WITH CORONARY ANGIOGRAM N/A 06/08/2013   Procedure: LEFT HEART CATHETERIZATION WITH CORONARY ANGIOGRAM;  Surgeon: Sanda Klein, MD;  Location: [facility] CATH LAB;  Service: Cardiovascular;  Laterality: N/A;      Allergies Codeine; Imdur [isosorbide dinitrate]; and Prednisone  Family History  Problem Relation Age of Onset  . Lung cancer Father   . Heart attack Neg Hx   . Stroke Neg Hx   . Hypertension Neg Hx     Social History Social History  Substance Use Topics  . Smoking status: Former Smoker    Packs/day: 1.00    Years: 30.00    Types: Cigarettes    Quit date: 05/08/2007  . Smokeless tobacco: Never Used  . Alcohol use Yes     Comment: rarely    Review of Systems  Constitutional: No fever/chills Eyes: No visual changes. ENT: No sore throat. Cardiovascular: Positive chest pain. Respiratory: Positive shortness of breath. Gastrointestinal: No abdominal pain.  No nausea, no vomiting.  No diarrhea.  No constipation. Genitourinary: Negative for dysuria. Musculoskeletal: Negative for back pain. Skin: Negative for rash. Neurological: Negative for headaches, focal weakness or numbness.  10-point ROS otherwise negative.  ____________________________________________   PHYSICAL EXAM:  VITAL SIGNS: ED Triage Vitals [  10/22/16 1620]  Enc Vitals Group     BP (!) 162/97     Pulse Rate (!) 116     Resp 18     Temp 98.4 F (36.9 C)     Temp Source Oral     SpO2 95 %   Constitutional: Alert and oriented. Well appearing and in no acute distress. Eyes: Conjunctivae are normal.  Head: Atraumatic. Nose: No congestion/rhinnorhea. Mouth/Throat: Mucous membranes are moist.  Oropharynx  non-erythematous. Neck: No stridor. Cardiovascular: Tachycardia. Good peripheral circulation. Grossly normal heart sounds.   Respiratory: Increased respiratory effort.  No retractions. Lungs with both inspiratory and expiratory wheezing.  Gastrointestinal: Soft and nontender. No distention.  Musculoskeletal: No lower extremity tenderness nor edema. No gross deformities of extremities. Neurologic:  Normal speech and language. No gross focal neurologic deficits are appreciated.  Skin:  Skin is warm, dry and intact. No rash noted.  ____________________________________________   LABS (all labs ordered are listed, but only abnormal results are displayed)  Labs Reviewed  BASIC METABOLIC PANEL - Abnormal; Notable for the following:       Result Value   Potassium 3.1 (*)    Glucose, Bld 122 (*)    Calcium 7.9 (*)    All other components within normal limits  CBC WITH DIFFERENTIAL/PLATELET - Abnormal; Notable for the following:    WBC 12.6 (*)    Hemoglobin 11.5 (*)    MCV 77.1 (*)    MCH 24.6 (*)    RDW 15.8 (*)    Neutro Abs 11.5 (*)    Lymphs Abs 0.6 (*)    All other components within normal limits  COMPREHENSIVE METABOLIC PANEL - Abnormal; Notable for the following:    Glucose, Bld 142 (*)    Creatinine, Ser 0.42 (*)    Albumin 3.1 (*)    All other components within normal limits  FERRITIN - Abnormal; Notable for the following:    Ferritin 389 (*)    All other components within normal limits  IRON AND TIBC - Abnormal; Notable for the following:    Iron 15 (*)    TIBC 238 (*)    Saturation Ratios 6 (*)    All other components within normal limits  CULTURE, EXPECTORATED SPUTUM-ASSESSMENT  GRAM STAIN  CULTURE, RESPIRATORY (NON-EXPECTORATED)  RESPIRATORY PANEL BY PCR  BRAIN NATRIURETIC PEPTIDE  STREP PNEUMONIAE URINARY ANTIGEN  PROCALCITONIN  RETICULOCYTES  LEGIONELLA PNEUMOPHILA SEROGP 1 UR AG  INFLUENZA PANEL BY PCR (TYPE A & B)  I-STAT TROPOININ, ED    ____________________________________________  RADIOLOGY  Dg Chest 2 View  Result Date: 10/22/2016 CLINICAL DATA:  Acute onset of shortness of breath and congestion. Initial encounter. EXAM: CHEST  2 VIEW COMPARISON:  Chest radiograph performed 01/19/2016, and PET/CT performed 02/28/2016 FINDINGS: There is new consolidation of the left upper lung zone, obscuring the previously noted mass. This likely reflects postobstructive pneumonia. There is no evidence of pleural effusion or pneumothorax. The heart is normal in size; the mediastinal contour is within normal limits. No acute osseous abnormalities are seen. IMPRESSION: New consolidation of the left upper lung zone, obscuring the previously noted mass. This likely reflects postobstructive pneumonia. Electronically Signed   By: Garald Balding M.D.   On: 10/22/2016 19:07    ____________________________________________   PROCEDURES  Procedure(s) performed:   Procedures  None ____________________________________________   INITIAL IMPRESSION / ASSESSMENT AND PLAN / ED COURSE  Pertinent labs & imaging results that were available during my care of the patient were reviewed  by me and considered in my medical decision making (see chart for details).  Patient presents to the emergency department for evaluation of difficulty breathing and intermittent chest pressure over the past 3 days. Symptoms are worsening. No active chest pain at this time. Patient has history of chronic respiratory failure and COPD. She is supposed to be on 2 L nasal cannula at most times. Feeling only slightly better after breathing treatments in route and at home. Given 125 Solu-Medrol in route. SOB is somewhat positional. No changes in chest pain with position.   Patient with PNA on CXR. Starting CAP coverage. With continued dyspnea and severe underlying COPD plan for admission for further breathing treatments. Patient feeling slightly better after treatments but then  begins having dyspnea again shortly afterwards.   Discussed patient's case with Hospitalist, Dr. Loleta Books. Patient and family (if present) updated with plan. Care transferred to Hospitalist service.  I reviewed all nursing notes, vitals, pertinent old records, EKGs, labs, imaging (as available).  ____________________________________________  FINAL CLINICAL IMPRESSION(S) / ED DIAGNOSES  Final diagnoses:  Community acquired pneumonia of left upper lobe of lung ([place])  COPD exacerbation ([place])     MEDICATIONS GIVEN DURING THIS VISIT:  Medications  cefTRIAXone (ROCEPHIN) 1 g in dextrose 5 % 50 mL IVPB (not administered)  azithromycin (ZITHROMAX) 500 mg in dextrose 5 % 250 mL IVPB (not administered)  amLODipine (NORVASC) tablet 2.5 mg (not administered)  atorvastatin (LIPITOR) tablet 20 mg (20 mg Oral Given 10/22/16 2323)  mometasone-formoterol (DULERA) 200-5 MCG/ACT inhaler 2 puff (2 puffs Inhalation Given 10/23/16 0747)  enoxaparin (LOVENOX) injection 40 mg (40 mg Subcutaneous Given 10/22/16 2323)  0.9 % NaCl with KCl 20 mEq/ L  infusion ( Intravenous New Bag/Given 10/22/16 2327)  acetaminophen (TYLENOL) tablet 650 mg (not administered)    Or  acetaminophen (TYLENOL) suppository 650 mg (not administered)  ondansetron (ZOFRAN) tablet 4 mg (not administered)    Or  ondansetron (ZOFRAN) injection 4 mg (not administered)  albuterol (PROVENTIL) (2.5 MG/3ML) 0.083% nebulizer solution 2.5 mg (2.5 mg Nebulization Given 10/22/16 2327)  ipratropium-albuterol (DUONEB) 0.5-2.5 (3) MG/3ML nebulizer solution 3 mL (3 mLs Nebulization Given 10/23/16 0747)  guaiFENesin (MUCINEX) 12 hr tablet 1,200 mg (not administered)  benzonatate (TESSALON) capsule 100 mg (not administered)  ALPRAZolam (XANAX) tablet 0.25 mg (not administered)  budesonide (PULMICORT) nebulizer solution 0.25 mg (not administered)  ipratropium-albuterol (DUONEB) 0.5-2.5 (3) MG/3ML nebulizer solution 3 mL (3 mLs Nebulization Given 10/22/16  1830)  cefTRIAXone (ROCEPHIN) 1 g in dextrose 5 % 50 mL IVPB (0 g Intravenous Stopped 10/22/16 2044)  azithromycin (ZITHROMAX) 500 mg in dextrose 5 % 250 mL IVPB (0 mg Intravenous Stopped 10/23/16 0023)     NEW OUTPATIENT MEDICATIONS STARTED DURING THIS VISIT:  None   Note:  This document was prepared using Dragon voice recognition software and may include unintentional dictation errors.  Nanda Quinton, MD Emergency Medicine  Yusra Ravert, [facility], MD 10/23/16 1010

## 2016-10-22 NOTE — ED Notes (Signed)
Pt ambulated to bed with no assistance

## 2016-10-23 LAB — EXPECTORATED SPUTUM ASSESSMENT W GRAM STAIN, RFLX TO RESP C

## 2016-10-23 LAB — COMPREHENSIVE METABOLIC PANEL
ALBUMIN: 3.1 g/dL — AB (ref 3.5–5.0)
ALT: 16 U/L (ref 14–54)
ANION GAP: 8 (ref 5–15)
AST: 21 U/L (ref 15–41)
Alkaline Phosphatase: 95 U/L (ref 38–126)
BILIRUBIN TOTAL: 0.4 mg/dL (ref 0.3–1.2)
BUN: 8 mg/dL (ref 6–20)
CO2: 28 mmol/L (ref 22–32)
Calcium: 9.8 mg/dL (ref 8.9–10.3)
Chloride: 103 mmol/L (ref 101–111)
Creatinine, Ser: 0.42 mg/dL — ABNORMAL LOW (ref 0.44–1.00)
GFR calc Af Amer: >60 mL/min (ref >60)
GFR calc non Af Amer: >60 mL/min (ref >60)
GLUCOSE: 142 mg/dL — AB (ref 65–99)
POTASSIUM: 3.8 mmol/L (ref 3.5–5.1)
SODIUM: 139 mmol/L (ref 135–145)
TOTAL PROTEIN: 7.4 g/dL (ref 6.5–8.1)

## 2016-10-23 LAB — IRON AND TIBC
Iron: 15 ug/dL — ABNORMAL LOW (ref 28–170)
SATURATION RATIOS: 6 % — AB (ref 10.4–31.8)
TIBC: 238 ug/dL — AB (ref 250–450)
UIBC: 223 ug/dL

## 2016-10-23 LAB — INFLUENZA PANEL BY PCR (TYPE A & B)
INFLAPCR: NEGATIVE
INFLBPCR: NEGATIVE

## 2016-10-23 LAB — RETICULOCYTES
RBC.: 4.98 MIL/uL (ref 3.87–5.11)
RETIC COUNT ABSOLUTE: 49.8 10*3/uL (ref 19.0–186.0)
RETIC CT PCT: 1 % (ref 0.4–3.1)

## 2016-10-23 LAB — EXPECTORATED SPUTUM ASSESSMENT W REFEX TO RESP CULTURE

## 2016-10-23 LAB — FERRITIN: Ferritin: 389 ng/mL — ABNORMAL HIGH (ref 11–307)

## 2016-10-23 LAB — STREP PNEUMONIAE URINARY ANTIGEN: Strep Pneumo Urinary Antigen: NEGATIVE

## 2016-10-23 MED ORDER — BENZONATATE 100 MG PO CAPS
100.0000 mg | ORAL_CAPSULE | Freq: Three times a day (TID) | ORAL | Status: DC
  Administered 2016-10-23 – 2016-10-24 (×4): 100 mg via ORAL
  Filled 2016-10-23 (×4): qty 1

## 2016-10-23 MED ORDER — FERROUS SULFATE 325 (65 FE) MG PO TABS
325.0000 mg | ORAL_TABLET | Freq: Two times a day (BID) | ORAL | Status: DC
  Administered 2016-10-23 – 2016-10-29 (×6): 325 mg via ORAL
  Filled 2016-10-23 (×10): qty 1

## 2016-10-23 MED ORDER — BUDESONIDE 0.25 MG/2ML IN SUSP
0.2500 mg | Freq: Two times a day (BID) | RESPIRATORY_TRACT | Status: DC
  Administered 2016-10-23: 0.25 mg via RESPIRATORY_TRACT
  Filled 2016-10-23: qty 2

## 2016-10-23 MED ORDER — GUAIFENESIN ER 600 MG PO TB12
1200.0000 mg | ORAL_TABLET | Freq: Two times a day (BID) | ORAL | Status: DC
  Administered 2016-10-23 – 2016-11-02 (×21): 1200 mg via ORAL
  Filled 2016-10-23 (×21): qty 2

## 2016-10-23 MED ORDER — ARFORMOTEROL TARTRATE 15 MCG/2ML IN NEBU
15.0000 ug | INHALATION_SOLUTION | Freq: Two times a day (BID) | RESPIRATORY_TRACT | Status: DC
  Filled 2016-10-23 (×15): qty 2

## 2016-10-23 MED ORDER — METHYLPREDNISOLONE SODIUM SUCC 40 MG IJ SOLR
40.0000 mg | Freq: Two times a day (BID) | INTRAMUSCULAR | Status: DC
  Administered 2016-10-23 – 2016-10-27 (×9): 40 mg via INTRAVENOUS
  Filled 2016-10-23 (×9): qty 1

## 2016-10-23 MED ORDER — ALPRAZOLAM 0.25 MG PO TABS
0.2500 mg | ORAL_TABLET | Freq: Three times a day (TID) | ORAL | Status: DC | PRN
  Administered 2016-10-23 (×2): 0.25 mg via ORAL
  Filled 2016-10-23 (×2): qty 1

## 2016-10-23 NOTE — Progress Notes (Signed)
Patient yelling out "help, help" c/o SOB after resp treatment O2 sat 99% no distress notes. Will continue to monitor patient.

## 2016-10-23 NOTE — Progress Notes (Signed)
PROGRESS NOTE    Brooke [name]   DVV:[id number]  DOB: 1945-08-09  DOA: 10/22/2016 PCP: [name], MD   Brief Narrative:  Brooke [name] is a 71 y.o. female with a past medical history significant for COPD on home O2, CAD and lung mass, presumed cancer who declined biopsy and presents with dyspnea. She began to feel dyspnea over the past 3-4 days at home. It is associated with increased sputum production. There have been no fevers and no sinus issues.   Subjective: Feels that she is having a hard time taking a deep breath. She has been checking her pulse ox by herself (has a pulse oximeter around her neck) and tells me that her pulse ox remains normal even when ambulating but she feels short of breath. The sputum she is bringing up is tan in color. No chest pain.   Assessment & Plan:   Principal Problem:   Community acquired pneumonia of left upper lobe of lung    Lung mass   COPD  GOLD IV   - may be related to current tumor and thus post obstructive - will also check Resp virus panel- we did have a case of the flu earlier this week in the hospital and therefore, I will check her for the flu as well.  - cont Rocephin and Zithromax - Nebs, Tessalon, Mucinex, Flutter valve and IV steroids  Active Problems:     Hypokalemia - has been replaced    Microcytic anemia - Iron panel reveal low Iron saturation and elevated Ferratin which may be high as an acute phase reactant  - will give oral Iron  Dehydration - cont IVF for today- she states she has poor oral fluid intake  DVT prophylaxis: Lovenox Code Status: NO CPR, NO Intubation, NO ACLS meds and NO defibrillation- can do BiPAP if needed Family Communication:  Disposition Plan:  Consultants:    Procedures:    Antimicrobials:  Anti-infectives    Start     Dose/Rate Route Frequency Ordered Stop   10/23/16 2200  azithromycin (ZITHROMAX) 500 mg in dextrose 5 % 250 mL IVPB     500 mg 250 mL/hr over 60  Minutes Intravenous Every 24 hours 10/22/16 1926     10/23/16 2000  cefTRIAXone (ROCEPHIN) 1 g in dextrose 5 % 50 mL IVPB     1 g 100 mL/hr over 30 Minutes Intravenous Every 24 hours 10/22/16 1926     10/22/16 1930  cefTRIAXone (ROCEPHIN) 1 g in dextrose 5 % 50 mL IVPB     1 g 100 mL/hr over 30 Minutes Intravenous  Once 10/22/16 1921 10/22/16 2044   10/22/16 1930  azithromycin (ZITHROMAX) 500 mg in dextrose 5 % 250 mL IVPB     500 mg 250 mL/hr over 60 Minutes Intravenous  Once 10/22/16 1921 10/23/16 0023       Objective: Vitals:   10/23/16 0420 10/23/16 0748 10/23/16 0810 10/23/16 1105  BP: (!) 113/58   132/68  Pulse: 83   87  Resp: 20     Temp: 98.5 F (36.9 C)     TempSrc: Oral     SpO2: 99% 96% 99%   Weight:      Height:        Intake/Output Summary (Last 24 hours) at 10/23/16 1306 Last data filed at 10/23/16 0900  Gross per 24 hour  Intake              120 ml  Output  700 ml  Net             -580 ml   Filed Weights   10/22/16 2129  Weight: 53.5 kg (118 lb)    Examination: General exam: Appears comfortable HEENT: PERRLA, oral mucosa moist, no sclera icterus or thrush Respiratory system: b/l wheezing noted. Respiratory effort normal.- pulse ox 95% on 2 L Cardiovascular system: S1 & S2 heard, RRR.  No murmurs  Gastrointestinal system: Abdomen soft, non-tender, nondistended. Normal bowel sound. No organomegaly Central nervous system: Alert and oriented. No focal neurological deficits. Extremities: No cyanosis, clubbing or edema Skin: No rashes or ulcers Psychiatry:  Mood & affect appropriate.     Data Reviewed: I have personally reviewed following labs and imaging studies  CBC:  Recent Labs Lab 10/22/16 1739  WBC 12.6*  NEUTROABS 11.5*  HGB 11.5*  HCT 36.1  MCV 77.1*  PLT 101   Basic Metabolic Panel:  Recent Labs Lab 10/22/16 1739 10/23/16 0412  NA 139 139  K 3.1* 3.8  CL 108 103  CO2 23 28  GLUCOSE 122* 142*  BUN 9 8    CREATININE 0.44 0.42*  CALCIUM 7.9* 9.8   GFR: Estimated Creatinine Clearance: 53.4 mL/min (A) (by C-G formula based on SCr of 0.42 mg/dL (L)). Liver Function Tests:  Recent Labs Lab 10/23/16 0412  AST 21  ALT 16  ALKPHOS 95  BILITOT 0.4  PROT 7.4  ALBUMIN 3.1*   No results for input(s): LIPASE, AMYLASE in the last 168 hours. No results for input(s): AMMONIA in the last 168 hours. Coagulation Profile: No results for input(s): INR, PROTIME in the last 168 hours. Cardiac Enzymes: No results for input(s): CKTOTAL, CKMB, CKMBINDEX, TROPONINI in the last 168 hours. BNP (last 3 results) No results for input(s): PROBNP in the last 8760 hours. HbA1C: No results for input(s): HGBA1C in the last 72 hours. CBG: No results for input(s): GLUCAP in the last 168 hours. Lipid Profile: No results for input(s): CHOL, HDL, LDLCALC, TRIG, CHOLHDL, LDLDIRECT in the last 72 hours. Thyroid Function Tests: No results for input(s): TSH, T4TOTAL, FREET4, T3FREE, THYROIDAB in the last 72 hours. Anemia Panel:  Recent Labs  10/23/16 0412  FERRITIN 389*  TIBC 238*  IRON 15*  RETICCTPCT 1.0   Urine analysis: No results found for: COLORURINE, APPEARANCEUR, LABSPEC, PHURINE, GLUCOSEU, HGBUR, BILIRUBINUR, KETONESUR, PROTEINUR, UROBILINOGEN, NITRITE, LEUKOCYTESUR Sepsis Labs: @LABRCNTIP (procalcitonin:4,lacticidven:4) ) Recent Results (from the past 240 hour(s))  Culture, sputum-assessment     Status: None   Collection Time: 10/22/16 11:55 PM  Result Value Ref Range Status   Specimen Description SPUTUM  Final   Special Requests NONE  Final   Sputum evaluation THIS SPECIMEN IS ACCEPTABLE FOR SPUTUM CULTURE  Final   Report Status 10/23/2016 FINAL  Final         Radiology Studies: Dg Chest 2 View  Result Date: 10/22/2016 CLINICAL DATA:  Acute onset of shortness of breath and congestion. Initial encounter. EXAM: CHEST  2 VIEW COMPARISON:  Chest radiograph performed 01/19/2016, and PET/CT  performed 02/28/2016 FINDINGS: There is new consolidation of the left upper lung zone, obscuring the previously noted mass. This likely reflects postobstructive pneumonia. There is no evidence of pleural effusion or pneumothorax. The heart is normal in size; the mediastinal contour is within normal limits. No acute osseous abnormalities are seen. IMPRESSION: New consolidation of the left upper lung zone, obscuring the previously noted mass. This likely reflects postobstructive pneumonia. Electronically Signed   By: Francoise Schaumann.D.  On: 10/22/2016 19:07      Scheduled Meds: . amLODipine  2.5 mg Oral Daily  . arformoterol  15 mcg Nebulization BID  . atorvastatin  20 mg Oral QHS  . benzonatate  100 mg Oral TID  . budesonide (PULMICORT) nebulizer solution  0.25 mg Nebulization BID  . enoxaparin (LOVENOX) injection  40 mg Subcutaneous Q24H  . guaiFENesin  1,200 mg Oral BID  . ipratropium-albuterol  3 mL Nebulization Q6H   Continuous Infusions: . 0.9 % NaCl with KCl 20 mEq / L 100 mL/hr at 10/23/16 1051  . azithromycin    . cefTRIAXone (ROCEPHIN)  IV       LOS: 1 day    Time spent in minutes: 35    Debbe Odea, MD [facility] Pager: [email or web address] Password [email or web address] 10/23/2016, 1:06 PM

## 2016-10-23 NOTE — H&P (Signed)
History and Physical  Patient Name: Brooke [name]     OQH:[id number]    DOB: 1945/06/24    DOA: 10/22/2016 PCP: [name], MD  Patient coming from: Home  Chief Complaint: Dyspnea      HPI: Brooke [name] is a 71 y.o. female with a past medical history significant for COPD on home O2, CAD and lung mass, presumed cancer, declined biopsy who presents with dyspnea.  The patient was in her usual state of health until last Thursday or Friday when she started to develop shortness of breath with exertion, subjective fevers, worsening wheezing, frequent thick sputum. Over the weekend this developed into shortness of breath with any exertion at all, left-sided chest pressure, and worsening wheezing and shortness of breath so she came to the emergency room today.  ED course: -Afebrile, heart rate 116, respirations and pulse ox normal on home oxygen, blood pressure 162/97 -Na 139, K 3.1, Cr 0.44, WBC 12.6K, Hgb 11.5 and microcytic -Troponin negative -BNP normal -Chest x-ray showed a dense left upper lobe opacity in the area of her previous lung mass -She was given ceftriaxone and azithromycin and DuoNeb and TRH was asked to evaluate for pneumonia  Last fall, the patient had an mass noted on chest x-ray. She was referred to pulmonology who recommended endobronchial biopsy which she declined. Her father had died of lung cancer and the patient has chosen not to have this worked up or treated.     ROS: Review of Systems  Constitutional: Positive for fever and malaise/fatigue.  Respiratory: Positive for cough, sputum production, shortness of breath and wheezing. Negative for hemoptysis.   Cardiovascular: Positive for chest pain.  All other systems reviewed and are negative.         Past Medical History:  Diagnosis Date  . Chronic respiratory failure (HCC)    a. on home O2.  Marland Kitchen COPD (chronic obstructive pulmonary disease) ([place])    a. Home O2.  . Former tobacco use   . GERD  (gastroesophageal reflux disease)   . Hyperlipidemia   . NSTEMI (non-ST elevated myocardial infarction) ([place])    a. 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 Coronary vasospasm    Past Surgical History:  Procedure Laterality Date  . LEFT HEART CATHETERIZATION WITH CORONARY ANGIOGRAM N/A 06/08/2013   Procedure: LEFT HEART CATHETERIZATION WITH CORONARY ANGIOGRAM;  Surgeon: Sanda Klein, MD;  Location: [facility];  Service: Cardiovascular;  Laterality: N/A;    Social History: Patient lives alone.  The patient walks unassisted.  Nonsmoker.  No alcohol.  She is from [place].  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  Divorced, no children.  POA is ex-husband.  Allergies  Allergen Reactions  . Codeine Nausea And Vomiting  . Imdur [Isosorbide Dinitrate]     headache  . Prednisone Other (See Comments)    Reaction:Abnormal behavior; cannot take in pill form but CAN tolerate the injection    Family history: family history includes Lung cancer in her father.  Prior to Admission medications   Medication Sig Start Date End Date Taking? Authorizing Provider  albuterol (PROVENTIL HFA;VENTOLIN HFA) 108 (90 BASE) MCG/ACT inhaler Inhale 2 puffs into the lungs every 6 (six) hours as needed. For wheezing/shortness of breath   Yes [provider]  amLODipine (NORVASC) 2.5 MG tablet Take 1 tablet (2.5 mg total) by mouth daily. 09/28/16  Yes [name], PA  atorvastatin (LIPITOR) 20 MG tablet  Take 1 tablet (20 mg total) by mouth daily. 09/28/16  Yes [name], PA  budesonide-formoterol (SYMBICORT) 160-4.5 MCG/ACT inhaler Inhale 2 puffs into the lungs 2 (two) times daily.   Yes [provider]  ipratropium-albuterol (DUONEB) 0.5-2.5 (3) MG/3ML SOLN Take 3 mLs by nebulization 2 (two) times daily.   Yes [provider]  nitroGLYCERIN (NITROSTAT) 0.4 MG SL tablet ONE TABLET UNDER TONGUE AS NEEDED FOR CHEST PAIN  EVERY 5 MINUTES FOR 3 DOSES 05/10/15  Yes Darlin Coco, MD       Physical Exam: BP (!) 113/58 (BP Location: Left Arm)   Pulse 83   Temp 98.5 F (36.9 C) (Oral)   Resp 20   Ht 5\' 3"  (1.6 m)   Wt 53.5 kg (118 lb)   SpO2 99%   BMI 20.90 kg/m  General appearance: Well-developed, elderly adult female, alert and comfortable only lying down.   Eyes: Anicteric, conjunctiva pink, lids and lashes normal. PERRL.    ENT: No nasal deformity, discharge, epistaxis.  Hearing normal. OP moist without lesions.   Neck: No neck masses.  Trachea midline.  No thyromegaly/tenderness. Lymph: No cervical or supraclavicular lymphadenopathy. Skin: Warm and dry.  Pale.  No jaundice.  No suspicious rashes or lesions. Cardiac: Tachycardic, nl S1-S2, no murmurs appreciated.  Capillary refill is brisk.  JVP normal.  No LE edema.  Radial and DP pulses 2+ and symmetric. Respiratory: Tachypneic, gets breathless just with sitting up.  Diminished at LUL.  Wheezing noted. Abdomen: Abdomen soft.  No TTP. No ascites, distension, hepatosplenomegaly.   MSK: No deformities or effusions.  No cyanosis or clubbing. Neuro: Cranial nerves normal.  Sensation intact to light touch. Speech is fluent.  Muscle strength normal.    Psych: Sensorium intact and responding to questions, attention normal.  Behavior appropriate.  Affect normal.  Judgment and insight appear normal.     Labs on Admission:  I have personally reviewed following labs and imaging studies: CBC:  Recent Labs Lab 10/22/16 1739  WBC 12.6*  NEUTROABS 11.5*  HGB 11.5*  HCT 36.1  MCV 77.1*  PLT 213   Basic Metabolic Panel:  Recent Labs Lab 10/22/16 1739 10/23/16 0412  NA 139 139  K 3.1* 3.8  CL 108 103  CO2 23 28  GLUCOSE 122* 142*  BUN 9 8  CREATININE 0.44 0.42*  CALCIUM 7.9* 9.8   GFR: Estimated Creatinine Clearance: 53.4 mL/min (A) (by C-G formula based on SCr of 0.42 mg/dL (L)).  Liver Function Tests:  Recent Labs Lab  10/23/16 0412  AST 21  ALT 16  ALKPHOS 95  BILITOT 0.4  PROT 7.4  ALBUMIN 3.1*   Anemia Panel:  Recent Labs  10/23/16 0412  RETICCTPCT 1.0    Recent Results (from the past 240 hour(s))  Culture, sputum-assessment     Status: None   Collection Time: 10/22/16 11:55 PM  Result Value Ref Range Status   Specimen Description SPUTUM  Final   Special Requests NONE  Final   Sputum evaluation THIS SPECIMEN IS ACCEPTABLE FOR SPUTUM CULTURE  Final   Report Status 10/23/2016 FINAL  Final         Radiological Exams on Admission: Personally reviewed CXR shows LULU opacity: Dg Chest 2 View  Result Date: 10/22/2016 CLINICAL DATA:  Acute onset of shortness of breath and congestion. Initial encounter. EXAM: CHEST  2 VIEW COMPARISON:  Chest radiograph performed 01/19/2016, and PET/CT performed 02/28/2016 FINDINGS: There is new consolidation of the left upper lung zone,  obscuring the previously noted mass. This likely reflects postobstructive pneumonia. There is no evidence of pleural effusion or pneumothorax. The heart is normal in size; the mediastinal contour is within normal limits. No acute osseous abnormalities are seen. IMPRESSION: New consolidation of the left upper lung zone, obscuring the previously noted mass. This likely reflects postobstructive pneumonia. Electronically Signed   By: Garald Balding M.D.   On: 10/22/2016 19:07    Echocardiogram 2015: Report reviewed EF 50-55% Grade I DD          Assessment/Plan Principal Problem:   Community acquired pneumonia of left upper lobe of lung ([place] [place]) Active Problems:   COPD  GOLD IV    Chronic respiratory failure with hypoxia (HCC)   COPD exacerbation (HCC)   Hypokalemia   Microcytic anemia  1. Community acquired pneumonia of left upper lobe:  Post-obstructive pneumonia in setting of LUL lung mass.   PSI score >90, given dyspnea, tachycadia, anemia, electrolyte disturbance and cancer. -Ceftriaxone and azithromycin -Obtain  sputum culture -Incentive spiro   2. COPD on home O2:  Patient declines prednisone or solu-medrol -Continue symbicort -Albuterol scheduled and PRN  3. Hypokalemia:  -Give K -Check Mag  4. Microcytic anemia:  This is new -Check iron, ferritin -Check reticulocytes  5. Coronary disease secondary prevention:  -Continue amlodipine and statin           DVT prophylaxis: Lovenox  Code Status: Limited Family Communication: None  Disposition Plan: Anticipate IV antibiotics, supplemental Oxygen and monitor for clinical improvement Consults called: None Admission status: INPATIENT    Medical decision making: Patient seen at 8:00 PM on 10/22/2016.  The patient was discussed with Dr. Laverta Baltimore.  What exists of the patient's chart was reviewed in depth and summarized above.  Clinical condition: stable on supplemental O2, mentation good, heart rate stbable.        Edwin Dada [facility] Pager [phone]      At the time of admission, it appears that the appropriate admission status for this patient is INPATIENT. This is judged to be reasonable and necessary in order to provide the required intensity of service to ensure the patient's safety given the presenting symptoms, physical exam findings, and initial radiographic and laboratory data in the context of their chronic comorbidities.  Together, these circumstances are felt to place her at high risk for further clinical deterioration threatening life, limb, or organ.   Patient requires inpatient status due to high intensity of service, high risk for further deterioration and high frequency of surveillance required because of this acute illness that poses a threat to life, limb or bodily function.  I certify that at the point of admission it is my clinical judgment that the patient will require inpatient hospital care spanning beyond 2 midnights from the point of admission and that early discharge would result in  unnecessary risk of decompensation and readmission or threat to life, limb or bodily function.

## 2016-10-24 LAB — RESPIRATORY PANEL BY PCR
Adenovirus: NOT DETECTED
BORDETELLA PERTUSSIS-RVPCR: NOT DETECTED
CORONAVIRUS OC43-RVPPCR: NOT DETECTED
Chlamydophila pneumoniae: NOT DETECTED
Coronavirus 229E: NOT DETECTED
Coronavirus HKU1: NOT DETECTED
Coronavirus NL63: NOT DETECTED
INFLUENZA A-RVPPCR: NOT DETECTED
INFLUENZA B-RVPPCR: NOT DETECTED
METAPNEUMOVIRUS-RVPPCR: NOT DETECTED
Mycoplasma pneumoniae: NOT DETECTED
PARAINFLUENZA VIRUS 1-RVPPCR: NOT DETECTED
PARAINFLUENZA VIRUS 2-RVPPCR: NOT DETECTED
Parainfluenza Virus 3: NOT DETECTED
Parainfluenza Virus 4: NOT DETECTED
RESPIRATORY SYNCYTIAL VIRUS-RVPPCR: NOT DETECTED
Rhinovirus / Enterovirus: NOT DETECTED

## 2016-10-24 LAB — PROCALCITONIN

## 2016-10-24 MED ORDER — ALPRAZOLAM 0.25 MG PO TABS
0.1250 mg | ORAL_TABLET | Freq: Three times a day (TID) | ORAL | Status: DC | PRN
  Administered 2016-10-24 – 2016-11-01 (×9): 0.125 mg via ORAL
  Filled 2016-10-24 (×11): qty 1

## 2016-10-24 MED ORDER — ENSURE ENLIVE PO LIQD
237.0000 mL | Freq: Three times a day (TID) | ORAL | Status: DC
  Administered 2016-10-24 – 2016-11-02 (×13): 237 mL via ORAL

## 2016-10-24 NOTE — Progress Notes (Signed)
Patient Demographics:    Brooke [name], is a 71 y.o. female, DOB - 02/14/1946, YCX:[id number]  Admit date - 10/22/2016   Admitting Physician Edwin Dada, MD  Outpatient Primary MD for the patient is [name], MD  LOS - 2   Chief Complaint  Patient presents with  . Shortness of Breath        Subjective:    Brooke [name] today has no fevers, no emesis,  No chest pain,  Sob persist, cough with yellow sputum   Assessment  & Plan :    Principal Problem:   Community acquired pneumonia of left upper lobe of lung ([facility]) Active Problems:   COPD  GOLD IV    Chronic respiratory failure with hypoxia (HCC)   COPD exacerbation (HCC)   Hypokalemia   Microcytic anemia   Brief summary:- Patient is 71 year old with history of COPD (GOLD IV and chronic hypoxic respiratory failure on 2 L of oxygen at home recently found to have left lung mass possibly malignant tumor who previously declined lung biopsy for more definitive diagnosis and treatment plan who was admitted on 10/22/2016 with left-sided postobstructive pneumonia.  Plan:- 1)Community acquired pneumonia of left upper lobe of lung - suspect postobstructive pneumonia, continue Rocephin/azithromycin (started 10/22/16), consider switching to IV Zosyn if she fails to improve as anticipated, continue bronchodilators, mucolytics and supplemental oxygen as ordered   2)Lt  Lung mass- again declines further workup , states she already had conversations with her pulmonologist previously  3)Acute and chronic hypoxic respiratory failure- increased respiratory distress and oxygen requirement due to #1 above  4)FEN- give ensure supplements, replace and recheck electrolytes  5)Microcytic Anemia- workup reveals low iron saturation,. Iron supplement ordered   DVT prophylaxis: Lovenox Code Status: NO CPR, NO Intubation, NO ACLS meds and NO  defibrillation- can do BiPAP if needed  Lab Results  Component Value Date   PLT 268 10/22/2016    Inpatient Medications  Scheduled Meds: . amLODipine  2.5 mg Oral Daily  . arformoterol  15 mcg Nebulization BID  . atorvastatin  20 mg Oral QHS  . enoxaparin (LOVENOX) injection  40 mg Subcutaneous Q24H  . feeding supplement (ENSURE ENLIVE)  237 mL Oral TID BM  . ferrous sulfate  325 mg Oral BID WC  . guaiFENesin  1,200 mg Oral BID  . ipratropium-albuterol  3 mL Nebulization Q6H  . methylPREDNISolone (SOLU-MEDROL) injection  40 mg Intravenous Q12H   Continuous Infusions: . 0.9 % NaCl with KCl 20 mEq / L 75 mL/hr at 10/24/16 1410  . azithromycin Stopped (10/23/16 2314)  . cefTRIAXone (ROCEPHIN)  IV Stopped (10/23/16 2314)   PRN Meds:.acetaminophen **OR** acetaminophen, albuterol, ALPRAZolam, ondansetron **OR** ondansetron (ZOFRAN) IV    Anti-infectives    Start     Dose/Rate Route Frequency Ordered Stop   10/23/16 2200  azithromycin (ZITHROMAX) 500 mg in dextrose 5 % 250 mL IVPB     500 mg 250 mL/hr over 60 Minutes Intravenous Every 24 hours 10/22/16 1926     10/23/16 2000  cefTRIAXone (ROCEPHIN) 1 g in dextrose 5 % 50 mL IVPB     1 g 100 mL/hr over 30 Minutes Intravenous Every 24 hours 10/22/16 1926     10/22/16 1930  cefTRIAXone (  ROCEPHIN) 1 g in dextrose 5 % 50 mL IVPB     1 g 100 mL/hr over 30 Minutes Intravenous  Once 10/22/16 1921 10/22/16 2044   10/22/16 1930  azithromycin (ZITHROMAX) 500 mg in dextrose 5 % 250 mL IVPB     500 mg 250 mL/hr over 60 Minutes Intravenous  Once 10/22/16 1921 10/23/16 0023        Objective:   Vitals:   10/24/16 0244 10/24/16 0616 10/24/16 0807 10/24/16 1310  BP:  (!) 141/77  (!) 119/56  Pulse:  80  85  Resp:  18  16  Temp:  98.4 F (36.9 C)  98.4 F (36.9 C)  TempSrc:  Oral  Oral  SpO2: 98% 98% 97% 100%  Weight:      Height:        Wt Readings from Last 3 Encounters:  10/22/16 53.5 kg (118 lb)  09/28/16 59 kg (130 lb)    02/15/16 54 kg (119 lb)    No intake or output data in the 24 hours ending 10/24/16 1525   Physical Exam  Gen:- Awake Alert,  Able to speak in short sentences HEENT:- [place].AT, No sclera icterus Neck-Supple Neck,No JVD,.  Nose- [place] 3 L/min Lungs-  diminished on the left with scattered rhonchi CV- S1, S2 normal Abd-  +ve B.Sounds, Abd Soft, No tenderness,    Extremity/Skin:- No  edema,      Data Review:   Micro Results Recent Results (from the past 240 hour(s))  Culture, sputum-assessment     Status: None   Collection Time: 10/22/16 11:55 PM  Result Value Ref Range Status   Specimen Description SPUTUM  Final   Special Requests NONE  Final   Sputum evaluation THIS SPECIMEN IS ACCEPTABLE FOR SPUTUM CULTURE  Final   Report Status 10/23/2016 FINAL  Final  Culture, respiratory (NON-Expectorated)     Status: None (Preliminary result)   Collection Time: 10/22/16 11:55 PM  Result Value Ref Range Status   Specimen Description SPUTUM  Final   Special Requests NONE Reflexed from [id number]  Final   Gram Stain   Final    ABUNDANT WBC PRESENT, PREDOMINANTLY PMN RARE SQUAMOUS EPITHELIAL CELLS PRESENT FEW GRAM POSITIVE COCCI IN PAIRS FEW GRAM NEGATIVE COCCOBACILLI RARE GRAM POSITIVE RODS    Culture   Final    CULTURE REINCUBATED FOR BETTER GROWTH Performed at [facility], [place] [address] [place],  [address]    Report Status PENDING  Incomplete  Respiratory Panel by PCR     Status: None   Collection Time: 10/23/16  1:49 PM  Result Value Ref Range Status   Adenovirus NOT DETECTED NOT DETECTED Final   Coronavirus 229E NOT DETECTED NOT DETECTED Final   Coronavirus HKU1 NOT DETECTED NOT DETECTED Final   Coronavirus NL63 NOT DETECTED NOT DETECTED Final   Coronavirus OC43 NOT DETECTED NOT DETECTED Final   Metapneumovirus NOT DETECTED NOT DETECTED Final   Rhinovirus / Enterovirus NOT DETECTED NOT DETECTED Final   Influenza A NOT DETECTED NOT DETECTED Final   Influenza B NOT  DETECTED NOT DETECTED Final   Parainfluenza Virus 1 NOT DETECTED NOT DETECTED Final   Parainfluenza Virus 2 NOT DETECTED NOT DETECTED Final   Parainfluenza Virus 3 NOT DETECTED NOT DETECTED Final   Parainfluenza Virus 4 NOT DETECTED NOT DETECTED Final   Respiratory Syncytial Virus NOT DETECTED NOT DETECTED Final   Bordetella pertussis NOT DETECTED NOT DETECTED Final   Chlamydophila pneumoniae NOT DETECTED NOT DETECTED Final  Mycoplasma pneumoniae NOT DETECTED NOT DETECTED Final    Comment: Performed at [facility], [place] [address], [place], [place] [address]    Radiology Reports Dg Chest 2 View  Result Date: 10/22/2016 CLINICAL DATA:  Acute onset of shortness of breath and congestion. Initial encounter. EXAM: CHEST  2 VIEW COMPARISON:  Chest radiograph performed 01/19/2016, and PET/CT performed 02/28/2016 FINDINGS: There is new consolidation of the left upper lung zone, obscuring the previously noted mass. This likely reflects postobstructive pneumonia. There is no evidence of pleural effusion or pneumothorax. The heart is normal in size; the mediastinal contour is within normal limits. No acute osseous abnormalities are seen. IMPRESSION: New consolidation of the left upper lung zone, obscuring the previously noted mass. This likely reflects postobstructive pneumonia. Electronically Signed   By: Garald Balding M.D.   On: 10/22/2016 19:07     CBC  Recent Labs Lab 10/22/16 1739  WBC 12.6*  HGB 11.5*  HCT 36.1  PLT 268  MCV 77.1*  MCH 24.6*  MCHC 31.9  RDW 15.8*  LYMPHSABS 0.6*  MONOABS 0.4  EOSABS 0.0  BASOSABS 0.0    Chemistries   Recent Labs Lab 10/22/16 1739 10/23/16 0412  NA 139 139  K 3.1* 3.8  CL 108 103  CO2 23 28  GLUCOSE 122* 142*  BUN 9 8  CREATININE 0.44 0.42*  CALCIUM 7.9* 9.8  AST  --  21  ALT  --  16  ALKPHOS  --  95  BILITOT  --  0.4    ------------------------------------------------------------------------------------------------------------------ No results for input(s): CHOL, HDL, LDLCALC, TRIG, CHOLHDL, LDLDIRECT in the last 72 hours.  Lab Results  Component Value Date   HGBA1C 5.5 06/06/2013   ------------------------------------------------------------------------------------------------------------------ No results for input(s): TSH, T4TOTAL, T3FREE, THYROIDAB in the last 72 hours.  Invalid input(s): FREET3 ------------------------------------------------------------------------------------------------------------------  Recent Labs  10/23/16 0412  FERRITIN 389*  TIBC 238*  IRON 15*  RETICCTPCT 1.0    Coagulation profile No results for input(s): INR, PROTIME in the last 168 hours.  No results for input(s): DDIMER in the last 72 hours.  Cardiac Enzymes No results for input(s): CKMB, TROPONINI, MYOGLOBIN in the last 168 hours.  Invalid input(s): CK ------------------------------------------------------------------------------------------------------------------    Component Value Date/Time   BNP 40.3 10/22/2016 1739     Ramiro Pangilinan M.D on 10/24/2016 at 3:25 PM  Between 7am to 7pm - Pager - [phone]  After 7pm go to [email or web address] - password [email or web address]  [facility] -  Office  [phone]   Voice Recognition Viviann Spare dictation system was used to create this note, attempts have been made to correct errors. Please contact the author with questions and/or clarifications.

## 2016-10-25 LAB — LEGIONELLA PNEUMOPHILA SEROGP 1 UR AG: L. pneumophila Serogp 1 Ur Ag: NEGATIVE

## 2016-10-25 LAB — CULTURE, RESPIRATORY W GRAM STAIN: Culture: NORMAL

## 2016-10-25 MED ORDER — AZITHROMYCIN 250 MG PO TABS
500.0000 mg | ORAL_TABLET | ORAL | Status: DC
  Administered 2016-10-25 – 2016-10-27 (×3): 500 mg via ORAL
  Filled 2016-10-25 (×4): qty 2

## 2016-10-25 NOTE — Progress Notes (Signed)
Patient Demographics:    Brooke [name], is a 71 y.o. female, DOB - [birth year], KDX:[id number]  Admit date - 10/22/2016   Admitting Physician Edwin Dada, MD  Outpatient Primary MD for the patient is [name], MD  LOS - 3   Chief Complaint  Patient presents with  . Shortness of Breath        Subjective:    Brooke [name] today has no fevers, no emesis,  SOB AND cough is better   Assessment  & Plan :    Principal Problem:   Community acquired pneumonia of left upper lobe of lung ([place]) Active Problems:   COPD  GOLD IV    Chronic respiratory failure with hypoxia (HCC)   COPD exacerbation (HCC)   Hypokalemia   Microcytic anemia  Brief summary:- Patient is 71 year old with history of COPD (GOLD IV and chronic hypoxic respiratory failure on 2 L of oxygen at home recently found to have left lung mass possibly malignant tumor who previously declined lung biopsy for more definitive diagnosis and treatment plan who was admitted on 10/22/2016 with left-sided postobstructive pneumonia.  Plan:- 1)Community acquired pneumonia of left upper lobe of lung - suspect postobstructive pneumonia, continue Rocephin/azithromycin (started 10/22/16), , continue bronchodilators, mucolytics and supplemental oxygen as ordered   2)Lt Lung mass- again declines further workup , states she already had conversations with her pulmonologist previously  3)Acute and chronic hypoxic respiratory failure-  Improving slightly , continue supplemental oxygen  4)FEN-  c/n ensure supplements, replace and recheck electrolytes  5)Microcytic Anemia- workup reveals low iron saturation,. Iron supplement ordered   6)Dispo-Patient has generalized weakness/debility, she Lived alone prior to admission, physical therapy eval requested, most likely need skilled nursing facility for subacute rehabilitation  Lab Results    Component Value Date   PLT 268 10/22/2016    Inpatient Medications  Scheduled Meds: . amLODipine  2.5 mg Oral Daily  . arformoterol  15 mcg Nebulization BID  . atorvastatin  20 mg Oral QHS  . azithromycin  500 mg Oral Q24H  . enoxaparin (LOVENOX) injection  40 mg Subcutaneous Q24H  . feeding supplement (ENSURE ENLIVE)  237 mL Oral TID BM  . ferrous sulfate  325 mg Oral BID WC  . guaiFENesin  1,200 mg Oral BID  . ipratropium-albuterol  3 mL Nebulization Q6H  . methylPREDNISolone (SOLU-MEDROL) injection  40 mg Intravenous Q12H   Continuous Infusions: . 0.9 % NaCl with KCl 20 mEq / L 75 mL/hr at 10/25/16 0505  . cefTRIAXone (ROCEPHIN)  IV Stopped (10/24/16 2056)   PRN Meds:.acetaminophen **OR** acetaminophen, albuterol, ALPRAZolam, ondansetron **OR** ondansetron (ZOFRAN) IV    Anti-infectives    Start     Dose/Rate Route Frequency Ordered Stop   10/25/16 2000  azithromycin (ZITHROMAX) tablet 500 mg     500 mg Oral Every 24 hours 10/25/16 0853     10/23/16 2200  azithromycin (ZITHROMAX) 500 mg in dextrose 5 % 250 mL IVPB  Status:  Discontinued     500 mg 250 mL/hr over 60 Minutes Intravenous Every 24 hours 10/22/16 1926 10/25/16 0853   10/23/16 2000  cefTRIAXone (ROCEPHIN) 1 g in dextrose 5 % 50 mL IVPB     1 g 100 mL/hr over 30 Minutes  Intravenous Every 24 hours 10/22/16 1926     10/22/16 1930  cefTRIAXone (ROCEPHIN) 1 g in dextrose 5 % 50 mL IVPB     1 g 100 mL/hr over 30 Minutes Intravenous  Once 10/22/16 1921 10/22/16 2044   10/22/16 1930  azithromycin (ZITHROMAX) 500 mg in dextrose 5 % 250 mL IVPB     500 mg 250 mL/hr over 60 Minutes Intravenous  Once 10/22/16 1921 10/23/16 0023        Objective:   Vitals:   10/25/16 0617 10/25/16 0849 10/25/16 1022 10/25/16 1349  BP: 123/74  (!) 136/94 (!) 153/86  Pulse: 79   96  Resp: 18   16  Temp: 98.2 F (36.8 C)   97.6 F (36.4 C)  TempSrc: Oral   Oral  SpO2: 98% 98%  96%  Weight:      Height:        Wt Readings  from Last 3 Encounters:  10/22/16 53.5 kg (118 lb)  09/28/16 59 kg (130 lb)  02/15/16 54 kg (119 lb)     Intake/Output Summary (Last 24 hours) at 10/25/16 1603 Last data filed at 10/25/16 1500  Gross per 24 hour  Intake             1973 ml  Output                0 ml  Net             1973 ml     Physical Exam  Gen:- Awake Alert,  Able to speak in short sentences, she is a pink puffer HEENT:- [place].AT, No sclera icterus Neck-Supple Neck,No JVD,.  Lungs-  diminished on left with scattered rhonchi CV- S1, S2 normal Abd-  +ve B.Sounds, Abd Soft, No tenderness,    Extremity/Skin:- No  edema,       Data Review:   Micro Results Recent Results (from the past 240 hour(s))  Culture, sputum-assessment     Status: None   Collection Time: 10/22/16 11:55 PM  Result Value Ref Range Status   Specimen Description SPUTUM  Final   Special Requests NONE  Final   Sputum evaluation THIS SPECIMEN IS ACCEPTABLE FOR SPUTUM CULTURE  Final   Report Status 10/23/2016 FINAL  Final  Culture, respiratory (NON-Expectorated)     Status: None   Collection Time: 10/22/16 11:55 PM  Result Value Ref Range Status   Specimen Description SPUTUM  Final   Special Requests NONE Reflexed from [id number]  Final   Gram Stain   Final    ABUNDANT WBC PRESENT, PREDOMINANTLY PMN RARE SQUAMOUS EPITHELIAL CELLS PRESENT FEW GRAM POSITIVE COCCI IN PAIRS FEW GRAM NEGATIVE COCCOBACILLI RARE GRAM POSITIVE RODS    Culture   Final    Consistent with normal respiratory flora. Performed at [facility], [place] [address], [place], [place] [address]    Report Status 10/25/2016 FINAL  Final  Respiratory Panel by PCR     Status: None   Collection Time: 10/23/16  1:49 PM  Result Value Ref Range Status   Adenovirus NOT DETECTED NOT DETECTED Final   Coronavirus 229E NOT DETECTED NOT DETECTED Final   Coronavirus HKU1 NOT DETECTED NOT DETECTED Final   Coronavirus NL63 NOT DETECTED NOT DETECTED Final   Coronavirus OC43 NOT  DETECTED NOT DETECTED Final   Metapneumovirus NOT DETECTED NOT DETECTED Final   Rhinovirus / Enterovirus NOT DETECTED NOT DETECTED Final   Influenza A NOT DETECTED NOT DETECTED Final   Influenza  B NOT DETECTED NOT DETECTED Final   Parainfluenza Virus 1 NOT DETECTED NOT DETECTED Final   Parainfluenza Virus 2 NOT DETECTED NOT DETECTED Final   Parainfluenza Virus 3 NOT DETECTED NOT DETECTED Final   Parainfluenza Virus 4 NOT DETECTED NOT DETECTED Final   Respiratory Syncytial Virus NOT DETECTED NOT DETECTED Final   Bordetella pertussis NOT DETECTED NOT DETECTED Final   Chlamydophila pneumoniae NOT DETECTED NOT DETECTED Final   Mycoplasma pneumoniae NOT DETECTED NOT DETECTED Final    Comment: Performed at [facility], [place] [address] [place], [place] [address]    Radiology Reports Dg Chest 2 View  Result Date: 10/22/2016 CLINICAL DATA:  Acute onset of shortness of breath and congestion. Initial encounter. EXAM: CHEST  2 VIEW COMPARISON:  Chest radiograph performed 01/19/2016, and PET/CT performed 02/28/2016 FINDINGS: There is new consolidation of the left upper lung zone, obscuring the previously noted mass. This likely reflects postobstructive pneumonia. There is no evidence of pleural effusion or pneumothorax. The heart is normal in size; the mediastinal contour is within normal limits. No acute osseous abnormalities are seen. IMPRESSION: New consolidation of the left upper lung zone, obscuring the previously noted mass. This likely reflects postobstructive pneumonia. Electronically Signed   By: Garald Balding M.D.   On: 10/22/2016 19:07     CBC  Recent Labs Lab 10/22/16 1739  WBC 12.6*  HGB 11.5*  HCT 36.1  PLT 268  MCV 77.1*  MCH 24.6*  MCHC 31.9  RDW 15.8*  LYMPHSABS 0.6*  MONOABS 0.4  EOSABS 0.0  BASOSABS 0.0    Chemistries   Recent Labs Lab 10/22/16 1739 10/23/16 0412  NA 139 139  K 3.1* 3.8  CL 108 103  CO2 23 28  GLUCOSE 122* 142*  BUN 9 8  CREATININE  0.44 0.42*  CALCIUM 7.9* 9.8  AST  --  21  ALT  --  16  ALKPHOS  --  95  BILITOT  --  0.4   ------------------------------------------------------------------------------------------------------------------ No results for input(s): CHOL, HDL, LDLCALC, TRIG, CHOLHDL, LDLDIRECT in the last 72 hours.  Lab Results  Component Value Date   HGBA1C 5.5 06/06/2013   ------------------------------------------------------------------------------------------------------------------ No results for input(s): TSH, T4TOTAL, T3FREE, THYROIDAB in the last 72 hours.  Invalid input(s): FREET3 ------------------------------------------------------------------------------------------------------------------  Recent Labs  10/23/16 0412  FERRITIN 389*  TIBC 238*  IRON 15*  RETICCTPCT 1.0    Coagulation profile No results for input(s): INR, PROTIME in the last 168 hours.  No results for input(s): DDIMER in the last 72 hours.  Cardiac Enzymes No results for input(s): CKMB, TROPONINI, MYOGLOBIN in the last 168 hours.  Invalid input(s): CK ------------------------------------------------------------------------------------------------------------------    Component Value Date/Time   BNP 40.3 10/22/2016 1739     Ginia Rudell M.D on 10/25/2016 at 4:03 PM  Between 7am to 7pm - Pager - [phone]  After 7pm go to [email or web address] - password [email or web address]  [facility] -  Office  [phone]   Voice Recognition Viviann Spare dictation system was used to create this note, attempts have been made to correct errors. Please contact the author with questions and/or clarifications.

## 2016-10-25 NOTE — Clinical Social Work Note (Signed)
Clinical Social Work Assessment  Patient Details  Name: Brooke [name] MRN: [id number] Date of Birth: [birth year]  Date of referral:  10/25/16               Reason for consult:  Facility Placement, Discharge Planning                Permission sought to share information with:  Family Supports Permission granted to share information::  Yes, Verbal Permission Granted  Name::     ex husband Kellene Mccleary  Agency::     Relationship::     Contact Information:  [phone]  Housing/Transportation Living arrangements for the past 2 months:  [place] of Information:  Patient Patient Interpreter Needed:  None Criminal Activity/Legal Involvement Pertinent to Current Situation/Hospitalization:  No - Comment as needed Significant Relationships:  Friend, Neighbor Lives with:  Self Do you feel safe going back to the place where you live?  Yes Need for family participation in patient care:  No (Coment)  Care giving concerns:  Pt from home where she resides alone, has support of friends and neighbors. States she lives an active life, enjoys driving, running errands, attending events, shopping, and ambulated without difficulty prior to current admission. Feels she may require assistance and therapy to regain ambulation at DC. Uses home O2.   Social Worker assessment / plan:  CSW consulted for potential SNF placement. Therapy consult pending- will follow for pt's needs. Pt states she is agreeable to SNF for rehab if needed.  Employment status:  Retired Nurse, adult PT Recommendations:   (pending) Information / Referral to community resources:     Patient/Family's Response to care: pt appreciative of care   Patient/Family's Understanding of and Emotional Response to Diagnosis, Current Treatment, and Prognosis:  Pt demonstrates adequate understanding of prognosis and plan, was very inquisitive and interactive re: her care and potential DC plans.  Expresses this is a setback for her as she "has never been sick like this before, just the COPD" but that she is "trying to not worry."  Emotional Assessment Appearance:  Appears stated age Attitude/Demeanor/Rapport:   (engaged, pleasant) Affect (typically observed):  Accepting (appropriate) Orientation:  Oriented to Self, Oriented to Place, Oriented to  Time, Oriented to Situation Alcohol / Substance use:  Not Applicable Psych involvement (Current and /or in the community):  No (Comment)  Discharge Needs  Concerns to be addressed:  Discharge Planning Concerns Readmission within the last 30 days:  No Current discharge risk:  None Barriers to Discharge:  Continued Medical Work up   [employer], LCSW 10/25/2016, 3:45 PM  [phone]

## 2016-10-26 ENCOUNTER — Inpatient Hospital Stay (HOSPITAL_COMMUNITY): Payer: Medicare Other

## 2016-10-26 LAB — BASIC METABOLIC PANEL
Anion gap: 10 (ref 5–15)
BUN: 11 mg/dL (ref 6–20)
CO2: 26 mmol/L (ref 22–32)
CREATININE: 0.41 mg/dL — AB (ref 0.44–1.00)
Calcium: 9.4 mg/dL (ref 8.9–10.3)
Chloride: 105 mmol/L (ref 101–111)
GFR calc Af Amer: >60 mL/min (ref >60)
Glucose, Bld: 152 mg/dL — ABNORMAL HIGH (ref 65–99)
Potassium: 4.2 mmol/L (ref 3.5–5.1)
SODIUM: 141 mmol/L (ref 135–145)

## 2016-10-26 NOTE — Evaluation (Signed)
Physical Therapy Evaluation Patient Details Name: Brooke [name] MRN: [id number] DOB: 11-Sep-1945 Today's Date: 10/26/2016   History of Present Illness  Pt was admitted for CAP.  H/O COPD  Clinical Impression  Pt admitted with above diagnosis. Pt currently with functional limitations due to the deficits listed below (see PT Problem List).  Pt will benefit from skilled PT to increase their independence and safety with mobility to allow discharge to the venue listed below.   Pt weak and  Deconditioned, therefore at risk for falls; unable to tol amb d/t incr WOB with sitting/standing/conversation; will continue to follow, recommend SNF     Follow Up Recommendations SNF    Equipment Recommendations  None recommended by PT    Recommendations for Other Services       Precautions / Restrictions Precautions Precautions: Fall Restrictions Weight Bearing Restrictions: No      Mobility  Bed Mobility Overal bed mobility: Independent                Transfers Overall transfer level: Needs assistance   Transfers: Sit to/from Stand Sit to Stand: Min guard         General transfer comment: pt reports she might "fall out" when standing, min to min -guard to rise, HR incr to 126, O2 sats decr 89%  Ambulation/Gait             General Gait Details: pt unable d/t incr WOB  Stairs            Wheelchair Mobility    Modified Rankin (Stroke Patients Only)       Balance Overall balance assessment: Needs assistance Sitting-balance support: Feet supported;Feet unsupported;Single extremity supported Sitting balance-Leahy Scale: Good     Standing balance support: No upper extremity supported;Single extremity supported Standing balance-Leahy Scale: Fair                               Pertinent Vitals/Pain Pain Assessment: 0-10 Pain Location: chest with breathing Pain Descriptors / Indicators: Discomfort Pain Intervention(s): Limited activity  within patient's tolerance;Monitored during session    Home Living Family/patient expects to be discharged to:: Skilled nursing facility Living Arrangements: Alone           Home Layout: Multi-level;Bed/bath upstairs Home Equipment: None      Prior Function Level of Independence: Independent         Comments: uses 2 liters 02, very independent and active     Hand Dominance        Extremity/Trunk Assessment   Upper Extremity Assessment Upper Extremity Assessment: Defer to OT evaluation;Generalized weakness    Lower Extremity Assessment Lower Extremity Assessment: Generalized weakness       Communication   Communication: No difficulties (ltd by dyspnea)  Cognition Arousal/Alertness: Awake/alert Behavior During Therapy: WFL for tasks assessed/performed Overall Cognitive Status: Within Functional Limits for tasks assessed                                        General Comments      Exercises     Assessment/Plan    PT Assessment Patient needs continued PT services  PT Problem List Decreased strength;Decreased activity tolerance;Decreased mobility;Cardiopulmonary status limiting activity       PT Treatment Interventions DME instruction;Gait training;Functional mobility training;Therapeutic activities;Therapeutic exercise;Patient/family education    PT Goals (Current goals can  be found in the Care Plan section)  Acute Rehab PT Goals Patient Stated Goal: get back to being independent PT Goal Formulation: With patient Time For Goal Achievement: 11/09/16 Potential to Achieve Goals: Fair    Frequency Min 3X/week   Barriers to discharge Decreased caregiver support;Inaccessible home environment      Co-evaluation               AM-PAC PT "6 Clicks" Daily Activity  Outcome Measure Difficulty turning over in bed (including adjusting bedclothes, sheets and blankets)?: None Difficulty moving from lying on back to sitting on the side  of the bed? : None Difficulty sitting down on and standing up from a chair with arms (e.g., wheelchair, bedside commode, etc,.)?: A Little Help needed moving to and from a bed to chair (including a wheelchair)?: A Little Help needed walking in hospital room?: A Little Help needed climbing 3-5 steps with a railing? : A Lot 6 Click Score: 19    End of Session Equipment Utilized During Treatment: Oxygen;Gait belt Activity Tolerance: Patient limited by fatigue Patient left: in bed;with call bell/phone within reach   PT Visit Diagnosis: Muscle weakness (generalized) (M62.81)    Time: 2080-2233 PT Time Calculation (min) (ACUTE ONLY): 21 min   Charges:   PT Evaluation $PT Eval Low Complexity: 1 Procedure     PT G Codes:   PT G-Codes **NOT FOR INPATIENT CLASS** Functional Limitation: Changing and maintaining body position Changing and Maintaining Body Position Current Status (K1224): At least 1 percent but less than 20 percent impaired, limited or restricted Changing and Maintaining Body Position Goal Status (S9753): At least 1 percent but less than 20 percent impaired, limited or restricted    Kenyon Ana, PT Pager: [phone] 10/26/2016  10/26/2016, 1:52 PM

## 2016-10-26 NOTE — Evaluation (Signed)
Occupational Therapy Evaluation Patient Details Name: Brooke [name] MRN: [id number] DOB: 01/08/1946 Today's Date: 10/26/2016    History of Present Illness Pt was admitted for CAP.  H/O COPD   Clinical Impression   This 71 year old female was admitted for the above. She wears 02 at baseline and is independent with adls. She will benefit from continued OT to increase activity tolerance for adls and further educate on energy conservation. Goals are for set up to supervision    Follow Up Recommendations  SNF    Equipment Recommendations  None recommended by OT    Recommendations for Other Services       Precautions / Restrictions Precautions Precautions: Fall Restrictions Weight Bearing Restrictions: No      Mobility Bed Mobility Overal bed mobility: Independent                Transfers Overall transfer level: Independent                    Balance                                           ADL either performed or assessed with clinical judgement   ADL Overall ADL's : Needs assistance/impaired     Grooming: Oral care;Sitting;Set up   Upper Body Bathing: Moderate assistance;Sitting   Lower Body Bathing: Moderate assistance;Sit to/from stand   Upper Body Dressing : Minimal assistance;Sitting   Lower Body Dressing: Maximal assistance;Sit to/from stand                 General ADL Comments: Pt first seen when she came back from xray.  Pt ambulated 3' from w/c to bed with min guard. Dyspnea 3/4.  Pt demonstrates pursed lip breathing and is familiar with  Some energy conservation, but she states she has never been this bad.  Pt is able to cross legs to reach feet, but she does not have the energy/reserve for adls. Fatiques easily and gets dyspnic.  Started energy conservation education.  Pt is has many questions for MD re:  Prognosis, labs. Gave her pen and paper to write them down     Vision         Perception      Praxis      Pertinent Vitals/Pain Pain Assessment: 0-10 Pain Location: chest with breathing Pain Descriptors / Indicators: Discomfort Pain Intervention(s): Limited activity within patient's tolerance;Monitored during session     Hand Dominance     Extremity/Trunk Assessment Upper Extremity Assessment Upper Extremity Assessment: Defer to OT evaluation          Communication Communication Communication:  (limited by dyspnea )   Cognition Arousal/Alertness: Awake/alert Behavior During Therapy: WFL for tasks assessed/performed Overall Cognitive Status: Within Functional Limits for tasks assessed                                     General Comments       Exercises     Shoulder Instructions      Home Living Family/patient expects to be discharged to:: Skilled nursing facility Living Arrangements: Alone           Home Layout: Multi-level;Bed/bath upstairs Alternate Level Stairs-Number of Steps: 6-7  Home Equipment: None          Prior Functioning/Environment Level of Independence: Independent        Comments: uses 2 liters 02, very independent and active        OT Problem List: Decreased activity tolerance;Cardiopulmonary status limiting activity;Decreased knowledge of use of DME or AE      OT Treatment/Interventions: Self-care/ADL training;DME and/or AE instruction;Patient/family education;Energy conservation    OT Goals(Current goals can be found in the care plan section) Acute Rehab OT Goals Patient Stated Goal: get back to being independent OT Goal Formulation: With patient Time For Goal Achievement: 11/02/16 Potential to Achieve Goals: Good ADL Goals Pt Will Transfer to Toilet: with supervision;regular height toilet;ambulating Additional ADL Goal #1: pt will complete adl with set up and extra time Additional ADL Goal #2: pt will verbalize 3 energy conservation strategies  OT Frequency: Min 2X/week   Barriers  to D/C:            Co-evaluation              AM-PAC PT "6 Clicks" Daily Activity     Outcome Measure Help from another person eating meals?: None Help from another person taking care of personal grooming?: A Little Help from another person toileting, which includes using toliet, bedpan, or urinal?: A Lot Help from another person bathing (including washing, rinsing, drying)?: A Lot Help from another person to put on and taking off regular upper body clothing?: A Lot Help from another person to put on and taking off regular lower body clothing?: A Lot 6 Click Score: 15   End of Session    Activity Tolerance: Patient limited by fatigue Patient left: in bed;with call bell/phone within reach  OT Visit Diagnosis: Muscle weakness (generalized) (M62.81)                Time: 9390-3009 OT Time Calculation (min): 23 min Charges:  OT General Charges $OT Visit: 1 Procedure OT Evaluation $OT Eval Low Complexity: 1 Procedure G-Codes:     [place], OTR/L [phone] 10/26/2016  Brooke [name] 10/26/2016, 1:12 PM

## 2016-10-26 NOTE — Care Management Important Message (Signed)
Important Message  Patient Details  Name: Brooke [name] MRN: [id number] Date of Birth: 09-Jun-1945   Medicare Important Message Given:  Yes    Kerin Salen 10/26/2016, 9:38 AMImportant Message  Patient Details  Name: Brooke [name] MRN: [id number] Date of Birth: 13-Apr-1946   Medicare Important Message Given:  Yes    Kerin Salen 10/26/2016, 9:38 AM

## 2016-10-26 NOTE — NC FL2 (Signed)
[place] LEVEL OF CARE SCREENING TOOL     IDENTIFICATION  Patient Name: Brooke [name] Birthdate: December 31, 1945 Sex: female Admission Date (Current Location): 10/22/2016  [facility] and [place] Number:  Herbalist and Address:  [facility],  [place] [address], [place]      Provider Number: [id number]  Attending Physician Name and Address:  Roxan Hockey, MD  Relative Name and Phone Number:       Current Level of Care: Hospital Recommended Level of Care: [employer] Prior Approval Number:    Date Approved/Denied:   PASRR Number: [id number]  Discharge Plan: SNF    Current Diagnoses: Patient Active Problem List   Diagnosis Date Noted  . Community acquired pneumonia of left upper lobe of lung ([place]) 10/22/2016  . COPD exacerbation ([place]) 10/22/2016  . Hypokalemia 10/22/2016  . Microcytic anemia 10/22/2016  . Mass of upper lobe of left lung 02/15/2016  . Chronic respiratory failure with hypoxia ([place]) 02/15/2016  . GERD (gastroesophageal reflux disease)   . Former tobacco use   . HLD (hyperlipidemia) 06/08/2013  . NSTEMI (non-ST elevated myocardial infarction) ([place]) 06/05/2013  . COPD  GOLD IV  06/05/2013    Orientation RESPIRATION BLADDER Height & Weight     Self, Time, Situation, Place  O2 (2L) Continent Weight: 118 lb (53.5 kg) Height:  5\' 3"  (160 cm)  BEHAVIORAL SYMPTOMS/MOOD NEUROLOGICAL BOWEL NUTRITION STATUS      Continent Diet (Heart healthy, thin fluid consistency)  AMBULATORY STATUS COMMUNICATION OF NEEDS Skin   Limited Assist Verbally Normal                       Personal Care Assistance Level of Assistance  Bathing, Feeding, Dressing Bathing Assistance: Limited assistance Feeding assistance: Independent Dressing Assistance: Independent     Functional Limitations Info  Sight, Hearing, Speech Sight Info: Adequate Hearing Info: Adequate Speech Info: Adequate    SPECIAL CARE  FACTORS FREQUENCY  PT (By licensed PT), OT (By licensed OT)     PT Frequency: 5x OT Frequency: 5x            Contractures Contractures Info: Not present    Additional Factors Info  Code Status, Allergies Code Status Info: partial Allergies Info: Codeine, Imdur Isosorbide Dinitrate, Prednisone           Current Medications (10/26/2016):  This is the current hospital active medication list Current Facility-Administered Medications  Medication Dose Route Frequency Provider Last Rate Last Dose  . 0.9 % NaCl with KCl 20 mEq/ L  infusion   Intravenous Continuous Debbe Odea, MD 75 mL/hr at 10/26/16 0818    . acetaminophen (TYLENOL) tablet 650 mg  650 mg Oral Q6H PRN Danford, Suann Larry, MD       Or  . acetaminophen (TYLENOL) suppository 650 mg  650 mg Rectal Q6H PRN Danford, Suann Larry, MD      . albuterol (PROVENTIL) (2.5 MG/3ML) 0.083% nebulizer solution 2.5 mg  2.5 mg Nebulization Q2H PRN Edwin Dada, MD   2.5 mg at 10/22/16 2327  . ALPRAZolam Duanne Moron) tablet 0.125 mg  0.125 mg Oral TID PRN Roxan Hockey, MD   0.125 mg at 10/25/16 2347  . amLODipine (NORVASC) tablet 2.5 mg  2.5 mg Oral Daily Danford, Suann Larry, MD   2.5 mg at 10/26/16 1002  . arformoterol (BROVANA) nebulizer solution 15 mcg  15 mcg Nebulization BID Debbe Odea, MD      . atorvastatin (  LIPITOR) tablet 20 mg  20 mg Oral QHS Danford, Suann Larry, MD   20 mg at 10/25/16 2238  . azithromycin (ZITHROMAX) tablet 500 mg  500 mg Oral Q24H Emokpae, Courage, MD   500 mg at 10/25/16 2238  . cefTRIAXone (ROCEPHIN) 1 g in dextrose 5 % 50 mL IVPB  1 g Intravenous Q24H Nilda Simmer, RPH   Stopped at 10/25/16 2126  . enoxaparin (LOVENOX) injection 40 mg  40 mg Subcutaneous Q24H Danford, Suann Larry, MD   40 mg at 10/25/16 2240  . feeding supplement (ENSURE ENLIVE) (ENSURE ENLIVE) liquid 237 mL  237 mL Oral TID BM Emokpae, Courage, MD   237 mL at 10/24/16 2216  . ferrous sulfate tablet 325 mg   325 mg Oral BID WC Debbe Odea, MD   325 mg at 10/26/16 1001  . guaiFENesin (MUCINEX) 12 hr tablet 1,200 mg  1,200 mg Oral BID Debbe Odea, MD   1,200 mg at 10/26/16 1002  . ipratropium-albuterol (DUONEB) 0.5-2.5 (3) MG/3ML nebulizer solution 3 mL  3 mL Nebulization Q6H Danford, Suann Larry, MD   3 mL at 10/26/16 0907  . methylPREDNISolone sodium succinate (SOLU-MEDROL) 40 mg/mL injection 40 mg  40 mg Intravenous Q12H Debbe Odea, MD   40 mg at 10/26/16 1002  . ondansetron (ZOFRAN) tablet 4 mg  4 mg Oral Q6H PRN Danford, Suann Larry, MD       Or  . ondansetron (ZOFRAN) injection 4 mg  4 mg Intravenous Q6H PRN Danford, Suann Larry, MD         Discharge Medications: Please see discharge summary for a list of discharge medications.  Relevant Imaging Results:  Relevant Lab Results:   Additional Information SS# [id number]  Nila Nephew, LCSW

## 2016-10-26 NOTE — Progress Notes (Signed)
Patient Demographics:    Brooke [name], is a 71 y.o. female, DOB - 11-02-1945, TMH:[id number]  Admit date - 10/22/2016   Admitting Physician Edwin Dada, MD  Outpatient Primary MD for the patient is [name], MD  LOS - 4   Chief Complaint  Patient presents with  . 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  Plan:- 1)Community Acquired Pneumonia of Left Upper Lobe of lung - Clinically improving slowly, shortness of breath and dyspnea on exertion is better, suspect postobstructive pneumonia, continue Rocephin/azithromycin (started 10/22/16), , continue bronchodilators, mucolytics and supplemental oxygen as ordered. Repeat chest x-ray from 10/26/2016 without significant change  2)Lt Lung mass-Pt declines further workup , states she already had conversations with her pulmonologist previously  3)Acute and chronic hypoxic respiratory failure- secondary to #1 above , overall  Improving slowly,  continue supplemental oxygen  4)FEN-  c/n ensuresupplements, replace and recheck electrolytes  5)Microcytic Anemia- workup reveals low iron saturation,. Iron supplement ordered  6)Dispo-Patient has  generalized weakness/debility, she Lived alone prior to admission,  PT/OT eval appreciated, awaiting transfer to skilled nursing facility for subacute rehabilitation  Code Status : DNR/DNI, but Bipap is ok  Disposition Plan  : SNF  Consults  :  PT/OT/RT  DVT Prophylaxis  :  SCDs/Lovenox  Lab Results  Component Value Date   PLT 268 10/22/2016    Inpatient Medications  Scheduled Meds: . amLODipine  2.5 mg Oral Daily  . arformoterol  15 mcg Nebulization BID  . atorvastatin  20 mg Oral QHS  . azithromycin  500 mg Oral Q24H  . enoxaparin (LOVENOX) injection  40 mg Subcutaneous Q24H  . feeding supplement (ENSURE ENLIVE)  237 mL Oral TID BM  . ferrous sulfate  325 mg Oral BID WC  . guaiFENesin  1,200 mg Oral BID  . ipratropium-albuterol  3 mL Nebulization Q6H  . methylPREDNISolone (SOLU-MEDROL) injection  40 mg Intravenous Q12H   Continuous Infusions: . 0.9 % NaCl with KCl 20 mEq / L 75 mL/hr at 10/26/16 0818  . cefTRIAXone (ROCEPHIN)  IV Stopped (10/25/16 2126)   PRN Meds:.acetaminophen **OR** acetaminophen, albuterol, ALPRAZolam, ondansetron **OR** ondansetron (ZOFRAN) IV    Anti-infectives    Start     Dose/Rate Route Frequency Ordered Stop   10/25/16 2000  azithromycin (ZITHROMAX) tablet 500 mg     500 mg Oral Every 24 hours 10/25/16 0853     10/23/16 2200  azithromycin (ZITHROMAX) 500 mg in dextrose 5 %  250 mL IVPB  Status:  Discontinued     500 mg 250 mL/hr over 60 Minutes Intravenous Every 24 hours 10/22/16 1926 10/25/16 0853   10/23/16 2000  cefTRIAXone (ROCEPHIN) 1 g in dextrose 5 % 50 mL IVPB     1 g 100 mL/hr over 30 Minutes Intravenous Every 24 hours 10/22/16 1926     10/22/16 1930  cefTRIAXone (ROCEPHIN) 1 g in dextrose 5 % 50 mL IVPB     1 g 100 mL/hr over 30 Minutes Intravenous  Once 10/22/16 1921 10/22/16 2044   10/22/16 1930  azithromycin (ZITHROMAX) 500 mg in dextrose 5 % 250 mL IVPB     500 mg 250 mL/hr over 60 Minutes Intravenous  Once 10/22/16 1921  10/23/16 0023        Objective:   Vitals:   10/25/16 2140 10/26/16 0142 10/26/16 0603 10/26/16 0907  BP: 121/65  130/63   Pulse: 75  79   Resp: 18  16   Temp: 98 F (36.7 C)  98.5 F (36.9 C)   TempSrc: Oral  Oral   SpO2: 94% 95% 98% 94%  Weight:      Height:        Wt Readings from Last 3 Encounters:  10/22/16 53.5 kg (118 lb)  09/28/16 59 kg (130 lb)  02/15/16 54 kg (119 lb)     Intake/Output Summary (Last 24 hours) at 10/26/16 1340 Last data filed at 10/26/16 0603  Gross per 24 hour  Intake          2719.25 ml  Output                0 ml  Net          2719.25 ml     Physical Exam  Gen:- Awake Alert,  In no apparent distress  HEENT:- [place].AT, No sclera icterus, [place] at 2 L/min Neck-Supple Neck,No JVD,.  Lungs-  improving that movement bilaterally scattered rhonchi, no wheezing at this time CV- S1, S2 normal Abd-  +ve B.Sounds, Abd Soft, No tenderness,    Extremity/Skin:- No  edema,       Data Review:   Micro Results Recent Results (from the past 240 hour(s))  Culture, sputum-assessment     Status: None   Collection Time: 10/22/16 11:55 PM  Result Value Ref Range Status   Specimen Description SPUTUM  Final   Special Requests NONE  Final   Sputum evaluation THIS SPECIMEN IS ACCEPTABLE FOR SPUTUM CULTURE  Final   Report Status 10/23/2016 FINAL  Final  Culture, respiratory (NON-Expectorated)     Status: None   Collection Time: 10/22/16 11:55 PM  Result Value Ref Range Status   Specimen Description SPUTUM  Final   Special Requests NONE Reflexed from [id number]  Final   Gram Stain   Final    ABUNDANT WBC PRESENT, PREDOMINANTLY PMN RARE SQUAMOUS EPITHELIAL CELLS PRESENT FEW GRAM POSITIVE COCCI IN PAIRS FEW GRAM NEGATIVE COCCOBACILLI RARE GRAM POSITIVE RODS    Culture   Final    Consistent with normal respiratory flora. Performed at [facility], [place] [address] [place], [place] [address]    Report Status 10/25/2016 FINAL  Final  Respiratory Panel by  PCR     Status: None   Collection Time: 10/23/16  1:49 PM  Result Value Ref Range Status   Adenovirus NOT DETECTED NOT DETECTED Final   Coronavirus 229E NOT DETECTED NOT DETECTED Final   Coronavirus HKU1 NOT DETECTED NOT DETECTED Final  Coronavirus NL63 NOT DETECTED NOT DETECTED Final   Coronavirus OC43 NOT DETECTED NOT DETECTED Final   Metapneumovirus NOT DETECTED NOT DETECTED Final   Rhinovirus / Enterovirus NOT DETECTED NOT DETECTED Final   Influenza A NOT DETECTED NOT DETECTED Final   Influenza B NOT DETECTED NOT DETECTED Final   Parainfluenza Virus 1 NOT DETECTED NOT DETECTED Final   Parainfluenza Virus 2 NOT DETECTED NOT DETECTED Final   Parainfluenza Virus 3 NOT DETECTED NOT DETECTED Final   Parainfluenza Virus 4 NOT DETECTED NOT DETECTED Final   Respiratory Syncytial Virus NOT DETECTED NOT DETECTED Final   Bordetella pertussis NOT DETECTED NOT DETECTED Final   Chlamydophila pneumoniae NOT DETECTED NOT DETECTED Final   Mycoplasma pneumoniae NOT DETECTED NOT DETECTED Final    Comment: Performed at [facility], [place] [address], [place], [place] [address]    Radiology Reports Dg Chest 2 View  Result Date: 10/26/2016 CLINICAL DATA:  Shortness of breath EXAM: CHEST  2 VIEW COMPARISON:  10/22/2016 FINDINGS: Collapse/ consolidation in the left upper lung persists in a similar fashion. Patient has known left upper lobe pulmonary mass and features remain compatible with postobstructive process. Right lung appears mildly hyperexpanded but clear. Nodular density projecting over the right lung base is compatible with nipple shadow. The cardiopericardial silhouette is within normal limits for size. Bones are diffusely demineralized. IMPRESSION: Stable exam. Volume loss and consolidation left upper lobe, likely post obstructive from patient's known left upper lobe lung mass. Electronically Signed   By: Misty Stanley M.D.   On: 10/26/2016 11:29   Dg Chest 2 View  Result Date:  10/22/2016 CLINICAL DATA:  Acute onset of shortness of breath and congestion. Initial encounter. EXAM: CHEST  2 VIEW COMPARISON:  Chest radiograph performed 01/19/2016, and PET/CT performed 02/28/2016 FINDINGS: There is new consolidation of the left upper lung zone, obscuring the previously noted mass. This likely reflects postobstructive pneumonia. There is no evidence of pleural effusion or pneumothorax. The heart is normal in size; the mediastinal contour is within normal limits. No acute osseous abnormalities are seen. IMPRESSION: New consolidation of the left upper lung zone, obscuring the previously noted mass. This likely reflects postobstructive pneumonia. Electronically Signed   By: Garald Balding M.D.   On: 10/22/2016 19:07     CBC  Recent Labs Lab 10/22/16 1739  WBC 12.6*  HGB 11.5*  HCT 36.1  PLT 268  MCV 77.1*  MCH 24.6*  MCHC 31.9  RDW 15.8*  LYMPHSABS 0.6*  MONOABS 0.4  EOSABS 0.0  BASOSABS 0.0    Chemistries   Recent Labs Lab 10/22/16 1739 10/23/16 0412 10/26/16 0343  NA 139 139 141  K 3.1* 3.8 4.2  CL 108 103 105  CO2 23 28 26   GLUCOSE 122* 142* 152*  BUN 9 8 11   CREATININE 0.44 0.42* 0.41*  CALCIUM 7.9* 9.8 9.4  AST  --  21  --   ALT  --  16  --   ALKPHOS  --  95  --   BILITOT  --  0.4  --    ------------------------------------------------------------------------------------------------------------------ No results for input(s): CHOL, HDL, LDLCALC, TRIG, CHOLHDL, LDLDIRECT in the last 72 hours.  Lab Results  Component Value Date   HGBA1C 5.5 06/06/2013   ------------------------------------------------------------------------------------------------------------------ No results for input(s): TSH, T4TOTAL, T3FREE, THYROIDAB in the last 72 hours.  Invalid input(s): FREET3 ------------------------------------------------------------------------------------------------------------------ No results for input(s): VITAMINB12, FOLATE, FERRITIN, TIBC,  IRON, RETICCTPCT in the last 72 hours.  Coagulation profile No results  for input(s): INR, PROTIME in the last 168 hours.  No results for input(s): DDIMER in the last 72 hours.  Cardiac Enzymes No results for input(s): CKMB, TROPONINI, MYOGLOBIN in the last 168 hours.  Invalid input(s): CK ------------------------------------------------------------------------------------------------------------------    Component Value Date/Time   BNP 40.3 10/22/2016 1739     Tarez Bowns M.D on 10/26/2016 at 1:40 PM  Between 7am to 7pm - Pager - [phone]  After 7pm go to [email or web address] - password [email or web address]  [facility] -  Office  [phone]   Voice Recognition Viviann Spare dictation system was used to create this note, attempts have been made to correct errors. Please contact the author with questions and/or clarifications.

## 2016-10-26 NOTE — Progress Notes (Addendum)
Met with pt following reviewing PT eval recommendations. Pt agreed to SNF referrals.  Obtained PASSR and completed FL2, submitted referrals. Submitted [employer] auth-pending.  Provided bed offers to pt- pt not yet decided. Will update [employer] MCR for auth once decided.   Sharren Bridge, MSW, LCSW Clinical Social Work 10/26/2016 [phone]  Received [facility] auth # [id number] 6/22-6/24, RUG level RHB.

## 2016-10-26 NOTE — Plan of Care (Signed)
Problem: Nutrition: Goal: Adequate nutrition will be maintained Encourage patient to take Ensure TID for nutrition  Patient describes a week long period of anorexia and a 6 lb weight loss.  She describes absolutely no appetite whatsoever.    Outcome: Progressing p

## 2016-10-27 MED ORDER — IPRATROPIUM-ALBUTEROL 0.5-2.5 (3) MG/3ML IN SOLN
3.0000 mL | Freq: Three times a day (TID) | RESPIRATORY_TRACT | Status: DC
  Administered 2016-10-27 (×2): 3 mL via RESPIRATORY_TRACT
  Filled 2016-10-27 (×2): qty 3

## 2016-10-27 MED ORDER — METHYLPREDNISOLONE SODIUM SUCC 40 MG IJ SOLR: 40.0000 mg | Freq: Every day | INTRAMUSCULAR | Status: DC

## 2016-10-27 NOTE — Progress Notes (Addendum)
PROGRESS NOTE  Brooke [name]  BHA:[id number] DOB: 09-Nov-1945 DOA: 10/22/2016 PCP: Brooke Sacramento, MD  Outpatient Specialists: [facility] Brief Narrative: Brooke [name] is a 71 y.o. female with a history of O2-dependent COPD, h/o tobacco use until 2009, and LUL mass without tissue diagnosis who presented 6/18 with respiratory distress found to have left-sided postobstructive pneumonia. She has improved mildly with antibiotics and steroids. Plan is to DC to SNF pending pulmonology evaluation.   Assessment & Plan: Principal Problem:   Community acquired pneumonia of left upper lobe of lung ([place]) Active Problems:   COPD  GOLD IV    Chronic respiratory failure with hypoxia (HCC)   COPD exacerbation (HCC)   Hypokalemia   Microcytic anemia  Acute on chronic hypoxemic respiratory failure: Due to pneumonia on COPD, 2L home O2-dependent since 2014.  - Continue supplemental oxygen and treatment of conditions as below - Pulmonology, Dr. Melvyn Novas consulted. Will see 6/24.   Left upper lobe pneumonia: Possibly post-obstructive behind LUL mass noted in 2017.  - PCT reassuring, will plan to finish abx (ceftriaxone/azithromycin started 6/18) on day 7.   - Acute debility/weakness has made her high risk for complications, falls with return to home, so plan is to discharge to SNF for rehabilitation per PT recommendations.   GOLD IV COPD: Spirometry 02/15/2016: FEV1 0.60 (28%). Ratio 38. - Continue bronchodilators, mucolytics - Deescalate steroids IV > IM 6/24. 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   Left upper lung mass: Characterized by PET scan 2017. Due to high risk for complications, the patient has declined further evaluation/biopsy.  Iron-deficiency anemia: workup reveals low iron saturation. - Iron supplement ordered - FOBT pending. Defer further evaluation to outpatient setting in absence of active gross bleeding.    DVT prophylaxis: Lovenox Code Status: BiPAP is ok, otherwise DNR/DNI Family Communication: None at bedside this AM. Disposition Plan: Pulmon consult pending, plan to DC to SNF 6/24.  Consultants:   Pulmonology, Dr. Melvyn Novas  Procedures:   None  Antimicrobials:  Ceftriaxone 6/18 >>   Azithromycin 6/18 >>    Subjective: Breathing not back to baseline, in fact, feels breathing was worse yesterday compared to admission and is only just starting to improve. Got to [facility] (2 steps) by herself overnight, which is improved from prior but no where near her baseline functioning (hosted a dinner party late last week). No fever, +cough productive of white sputum, no chest pain.   Objective: Vitals:   10/26/16 2036 10/27/16 0245 10/27/16 0637 10/27/16 1002  BP: 131/85  136/71   Pulse: 95  89   Resp: 20  20   Temp:   98.3 F (36.8 C)   TempSrc:   Oral   SpO2: 100% 96% 96% 96%  Weight:      Height:        Intake/Output Summary (Last 24 hours) at 10/27/16 1046 Last data filed at 10/26/16 2159  Gross per 24 hour  Intake              360 ml  Output                0 ml  Net              360 ml   Filed Weights   10/22/16 2129  Weight: 53.5 kg (118 lb)    Examination: General exam: Frail female in no distress Respiratory system: Tachypneic on 3L by ,  persistent wheezy cough. Decreased aeration throughout. No crackles..  Cardiovascular system: Regular rate and rhythm. No murmur, rub, or gallop. No JVD, and no pedal edema. Gastrointestinal system: Abdomen soft, non-tender, non-distended, with normoactive bowel sounds. No organomegaly or masses felt. Central nervous system: Alert and oriented. No focal neurological deficits. Extremities: Warm, +clubbing without cyanosis. Skin: No rashes, lesions no ulcers Psychiatry: Judgement and insight appear normal. Mood & affect appropriate.   Data Reviewed: I have personally reviewed following labs and imaging studies  CBC:  Recent  Labs Lab 10/22/16 1739  WBC 12.6*  NEUTROABS 11.5*  HGB 11.5*  HCT 36.1  MCV 77.1*  PLT 034   Basic Metabolic Panel:  Recent Labs Lab 10/22/16 1739 10/23/16 0412 10/26/16 0343  NA 139 139 141  K 3.1* 3.8 4.2  CL 108 103 105  CO2 23 28 26   GLUCOSE 122* 142* 152*  BUN 9 8 11   CREATININE 0.44 0.42* 0.41*  CALCIUM 7.9* 9.8 9.4   GFR: Estimated Creatinine Clearance: 53.4 mL/min (A) (by C-G formula based on SCr of 0.41 mg/dL (L)). Liver Function Tests:  Recent Labs Lab 10/23/16 0412  AST 21  ALT 16  ALKPHOS 95  BILITOT 0.4  PROT 7.4  ALBUMIN 3.1*   No results for input(s): LIPASE, AMYLASE in the last 168 hours. No results for input(s): AMMONIA in the last 168 hours. Coagulation Profile: No results for input(s): INR, PROTIME in the last 168 hours. Cardiac Enzymes: No results for input(s): CKTOTAL, CKMB, CKMBINDEX, TROPONINI in the last 168 hours. BNP (last 3 results) No results for input(s): PROBNP in the last 8760 hours. HbA1C: No results for input(s): HGBA1C in the last 72 hours. CBG: No results for input(s): GLUCAP in the last 168 hours. Lipid Profile: No results for input(s): CHOL, HDL, LDLCALC, TRIG, CHOLHDL, LDLDIRECT in the last 72 hours. Thyroid Function Tests: No results for input(s): TSH, T4TOTAL, FREET4, T3FREE, THYROIDAB in the last 72 hours. Anemia Panel: No results for input(s): VITAMINB12, FOLATE, FERRITIN, TIBC, IRON, RETICCTPCT in the last 72 hours. Urine analysis: No results found for: COLORURINE, APPEARANCEUR, [place], [place], [place], [place], [place], [place], [place], UROBILINOGEN, NITRITE, LEUKOCYTESUR Recent Results (from the past 240 hour(s))  Culture, sputum-assessment     Status: None   Collection Time: 10/22/16 11:55 PM  Result Value Ref Range Status   Specimen Description SPUTUM  Final   Special Requests NONE  Final   Sputum evaluation THIS SPECIMEN IS ACCEPTABLE FOR SPUTUM CULTURE  Final   Report Status 10/23/2016 FINAL   Final  Culture, respiratory (NON-Expectorated)     Status: None   Collection Time: 10/22/16 11:55 PM  Result Value Ref Range Status   Specimen Description SPUTUM  Final   Special Requests NONE Reflexed from [id number]  Final   Gram Stain   Final    ABUNDANT WBC PRESENT, PREDOMINANTLY PMN RARE SQUAMOUS EPITHELIAL CELLS PRESENT FEW GRAM POSITIVE COCCI IN PAIRS FEW GRAM NEGATIVE COCCOBACILLI RARE GRAM POSITIVE RODS    Culture   Final    Consistent with normal respiratory flora. Performed at [facility], [place] [address] [place], [place] [address]    Report Status 10/25/2016 FINAL  Final  Respiratory Panel by PCR     Status: None   Collection Time: 10/23/16  1:49 PM  Result Value Ref Range Status   Adenovirus NOT DETECTED NOT DETECTED Final   Coronavirus 229E NOT DETECTED NOT DETECTED Final   Coronavirus HKU1 NOT DETECTED NOT DETECTED Final   Coronavirus NL63  NOT DETECTED NOT DETECTED Final   Coronavirus OC43 NOT DETECTED NOT DETECTED Final   Metapneumovirus NOT DETECTED NOT DETECTED Final   Rhinovirus / Enterovirus NOT DETECTED NOT DETECTED Final   Influenza A NOT DETECTED NOT DETECTED Final   Influenza B NOT DETECTED NOT DETECTED Final   Parainfluenza Virus 1 NOT DETECTED NOT DETECTED Final   Parainfluenza Virus 2 NOT DETECTED NOT DETECTED Final   Parainfluenza Virus 3 NOT DETECTED NOT DETECTED Final   Parainfluenza Virus 4 NOT DETECTED NOT DETECTED Final   Respiratory Syncytial Virus NOT DETECTED NOT DETECTED Final   Bordetella pertussis NOT DETECTED NOT DETECTED Final   Chlamydophila pneumoniae NOT DETECTED NOT DETECTED Final   Mycoplasma pneumoniae NOT DETECTED NOT DETECTED Final    Comment: Performed at [facility], [place] [address], [place], [place] [address]      Radiology Studies: Dg Chest 2 View  Result Date: 10/26/2016 CLINICAL DATA:  Shortness of breath EXAM: CHEST  2 VIEW COMPARISON:  10/22/2016 FINDINGS: Collapse/ consolidation in the left upper lung  persists in a similar fashion. Patient has known left upper lobe pulmonary mass and features remain compatible with postobstructive process. Right lung appears mildly hyperexpanded but clear. Nodular density projecting over the right lung base is compatible with nipple shadow. The cardiopericardial silhouette is within normal limits for size. Bones are diffusely demineralized. IMPRESSION: Stable exam. Volume loss and consolidation left upper lobe, likely post obstructive from patient's known left upper lobe lung mass. Electronically Signed   By: Misty Stanley M.D.   On: 10/26/2016 11:29    Scheduled Meds: . amLODipine  2.5 mg Oral Daily  . arformoterol  15 mcg Nebulization BID  . atorvastatin  20 mg Oral QHS  . azithromycin  500 mg Oral Q24H  . enoxaparin (LOVENOX) injection  40 mg Subcutaneous Q24H  . feeding supplement (ENSURE ENLIVE)  237 mL Oral TID BM  . ferrous sulfate  325 mg Oral BID WC  . guaiFENesin  1,200 mg Oral BID  . ipratropium-albuterol  3 mL Nebulization Q6H  . methylPREDNISolone (SOLU-MEDROL) injection  40 mg Intravenous Q12H   Continuous Infusions: . 0.9 % NaCl with KCl 20 mEq / L 75 mL/hr at 10/26/16 2313  . cefTRIAXone (ROCEPHIN)  IV Stopped (10/26/16 2025)     LOS: 5 days   Time spent: 25 minutes.  Vance Gather, MD [facility] Pager [phone]  If 7PM-7AM, please contact night-coverage [email or web address] Password [facility] 10/27/2016, 10:46 AM

## 2016-10-27 NOTE — Plan of Care (Signed)
Problem: Nutrition: Goal: Adequate nutrition will be maintained Encourage patient to take Ensure TID for nutrition  Patient describes a week long period of anorexia and a 6 lb weight loss.  She describes absolutely no appetite whatsoever.    Outcome: Progressing p

## 2016-10-28 ENCOUNTER — Encounter (HOSPITAL_COMMUNITY): Payer: Self-pay | Admitting: Radiology

## 2016-10-28 ENCOUNTER — Inpatient Hospital Stay (HOSPITAL_COMMUNITY): Payer: Medicare Other

## 2016-10-28 DIAGNOSIS — J441 Chronic obstructive pulmonary disease with (acute) exacerbation: Secondary | ICD-10-CM

## 2016-10-28 DIAGNOSIS — R918 Other nonspecific abnormal finding of lung field: Secondary | ICD-10-CM

## 2016-10-28 DIAGNOSIS — J181 Lobar pneumonia, unspecified organism: Secondary | ICD-10-CM

## 2016-10-28 DIAGNOSIS — R0609 Other forms of dyspnea: Secondary | ICD-10-CM

## 2016-10-28 DIAGNOSIS — J9611 Chronic respiratory failure with hypoxia: Secondary | ICD-10-CM

## 2016-10-28 DIAGNOSIS — R0602 Shortness of breath: Secondary | ICD-10-CM

## 2016-10-28 MED ORDER — IOPAMIDOL (ISOVUE-300) INJECTION 61%
75.0000 mL | Freq: Once | INTRAVENOUS | Status: AC | PRN
  Administered 2016-10-28: 75 mL via INTRAVENOUS

## 2016-10-28 MED ORDER — DEXAMETHASONE SODIUM PHOSPHATE 10 MG/ML IJ SOLN
10.0000 mg | Freq: Once | INTRAMUSCULAR | Status: AC
  Administered 2016-10-29: 10 mg via INTRAMUSCULAR
  Filled 2016-10-28: qty 1

## 2016-10-28 MED ORDER — IOPAMIDOL (ISOVUE-300) INJECTION 61%
INTRAVENOUS | Status: AC
  Filled 2016-10-28: qty 75

## 2016-10-28 MED ORDER — IPRATROPIUM-ALBUTEROL 0.5-2.5 (3) MG/3ML IN SOLN
3.0000 mL | Freq: Four times a day (QID) | RESPIRATORY_TRACT | Status: DC
  Administered 2016-10-28 – 2016-11-02 (×22): 3 mL via RESPIRATORY_TRACT
  Filled 2016-10-28 (×24): qty 3

## 2016-10-28 MED ORDER — DEXTROSE 5 % IV SOLN
1.0000 g | INTRAVENOUS | Status: AC
  Administered 2016-10-28: 1 g via INTRAVENOUS
  Filled 2016-10-28: qty 10

## 2016-10-28 NOTE — Consult Note (Signed)
PULMONARY / CRITICAL CARE MEDICINE   Name: Brooke [name] MRN: [id number] DOB: 10-19-1945    ADMISSION DATE:  10/22/2016 CONSULTATION DATE:  10/28/16  REFERRING MD:  [facility]  CHIEF COMPLAINT:  Sob   HISTORY OF PRESENT ILLNESS:   This is a 71 year old white female quit smoking 2009 and  previously evaluated in the pulmonary clinic for mild dyspnea on exertion with dx GOLD IV COPD and a left upper lobe mass for which she canceled biopsy 03/2016 but had what she describes as a "pretty good quality of life" since that time until abruptly becoming short of breath following a dinner party she gave on June 14 and admitted 6/18 with dx of CAP vs postobstructive pneumonia involving the left upper lobe. Despite appropriate treatment for community acquired pneumonia  she cannot get from the bed to the  [facility]  even on oxygen without being out of breath and does not feel that she is any better since she was admitted.   No obvious day to day or daytime variability or assoc excess/ purulent sputum or mucus plugs or hemoptysis or cp or chest tightness, subjective wheeze or overt sinus or hb symptoms. No unusual exp hx or h/o childhood pna/ asthma or knowledge of premature birth.  Sleeping ok without nocturnal  or early am exacerbation  of respiratory  c/o's or need for noct saba. Also denies any obvious fluctuation of symptoms with weather or environmental changes or other aggravating or alleviating factors except as outlined above   Current Medications, Allergies, Complete Past Medical History, Past Surgical History, Family History, and Social History were reviewed in [employer] record.  ROS  The following are not active complaints unless bolded sore throat, dysphagia, dental problems, itching, sneezing,  nasal congestion or excess/ purulent secretions, ear ache,   fever, chills, sweats, unintended wt loss, classically pleuritic or exertional cp,  orthopnea pnd or leg swelling,  presyncope, palpitations, abdominal pain, anorexia, nausea, vomiting, diarrhea  or change in bowel or bladder habits, change in stools or urine, dysuria,hematuria,  rash, arthralgias, visual complaints, headache, numbness, weakness or ataxia or problems with walking or coordination,  change in mood/affect or memory.        PAST MEDICAL HISTORY :  She  has a past medical history of Chronic respiratory failure ([place]); COPD (chronic obstructive pulmonary disease) ([place]); Former tobacco use; GERD (gastroesophageal reflux disease); Hyperlipidemia; and NSTEMI (non-ST elevated myocardial infarction) ([place]).  PAST SURGICAL HISTORY: She  has a past surgical history that includes left heart catheterization with coronary angiogram (N/A, 06/08/2013).  Allergies  Allergen Reactions  . Codeine Nausea And Vomiting  . Imdur [Isosorbide Dinitrate]     headache  . Prednisone Other (See Comments)    Reaction:Abnormal behavior; cannot take in pill form but CAN tolerate the injection    No current facility-administered medications on file prior to encounter.    Current Outpatient Prescriptions on File Prior to Encounter  Medication Sig  . albuterol (PROVENTIL HFA;VENTOLIN HFA) 108 (90 BASE) MCG/ACT inhaler Inhale 2 puffs into the lungs every 6 (six) hours as needed. For wheezing/shortness of breath  . amLODipine (NORVASC) 2.5 MG tablet Take 1 tablet (2.5 mg total) by mouth daily.  Marland Kitchen atorvastatin (LIPITOR) 20 MG tablet Take 1 tablet (20 mg total) by mouth daily.  . budesonide-formoterol (SYMBICORT) 160-4.5 MCG/ACT inhaler Inhale 2 puffs into the lungs 2 (two) times daily.  Marland Kitchen ipratropium-albuterol (DUONEB) 0.5-2.5 (3) MG/3ML SOLN Take 3 mLs by nebulization 2 (two) times daily.  Marland Kitchen  nitroGLYCERIN (NITROSTAT) 0.4 MG SL tablet ONE TABLET UNDER TONGUE AS NEEDED FOR CHEST PAIN EVERY 5 MINUTES FOR 3 DOSES    FAMILY HISTORY:  Her indicated that her mother is deceased. She indicated that her father is deceased. She  indicated that the status of her neg hx is unknown.    SOCIAL HISTORY: She  reports that she quit smoking about 9 years ago. Her smoking use included Cigarettes. She has a 30.00 pack-year smoking history. She has never used smokeless tobacco. She reports that she drinks alcohol. She reports that she does not use drugs.     SUBJECTIVE:  Chronically ill lying in fetal position on 02 horizontal in bed with no complaints at rest   VITAL SIGNS: BP (!) 142/67 (BP Location: Left Arm)   Pulse 81   Temp 98 F (36.7 C) (Oral)   Resp 18   Ht 5\' 3"  (1.6 m)   Wt 118 lb (53.5 kg)   SpO2 93%   BMI 20.90 kg/m   HEMODYNAMICS:    VENTILATOR SETTINGS:    INTAKE / OUTPUT: I/O last 3 completed shifts: In: 3835 [P.O.:960; I.V.:2775; IV Piggyback:100] Out: -   PHYSICAL EXAMINATION: Chronically ill appearing elderly wf fetal position no sob at rest  Wt Readings from Last 3 Encounters:  10/22/16 118 lb (53.5 kg)  09/28/16 130 lb (59 kg)  02/15/16 119 lb (54 kg)    Vital signs reviewed   HEENT: nl dentition, turbinates bilaterally, and oropharynx. Nl external ear canals without cough reflex   NECK :  without JVD/Nodes/TM/ nl carotid upstrokes bilaterally   LUNGS: no acc muscle use,  slt barrel contour with extremely distant bs esp on L no localized or gen wheeze   CV:  RRR  no s3 or murmur or increase in P2, and no edema   ABD:  soft and nontender with nl inspiratory excursion in the supine position. No bruits or organomegaly appreciated, bowel sounds nl  MS:  Nl gait/ ext warm without deformities, calf tenderness, cyanosis  - ? Mild  clubbing No obvious joint restrictions   SKIN: warm and dry without lesions    NEURO:  alert, approp, nl sensorium with  no motor or cerebellar deficits apparent.   LABS:  BMET  Recent Labs Lab 10/22/16 1739 10/23/16 0412 10/26/16 0343  NA 139 139 141  K 3.1* 3.8 4.2  CL 108 103 105  CO2 23 28 26   BUN 9 8 11   CREATININE 0.44 0.42*  0.41*  GLUCOSE 122* 142* 152*    Electrolytes  Recent Labs Lab 10/22/16 1739 10/23/16 0412 10/26/16 0343  CALCIUM 7.9* 9.8 9.4    CBC  Recent Labs Lab 10/22/16 1739  WBC 12.6*  HGB 11.5*  HCT 36.1  PLT 268    Coag's No results for input(s): APTT, INR in the last 168 hours.  Sepsis Markers  Recent Labs Lab 10/22/16 2209 10/24/16 0426  PROCALCITON <0.10 <0.10    ABG No results for input(s): PHART, PCO2ART, PO2ART in the last 168 hours.  Liver Enzymes  Recent Labs Lab 10/23/16 0412  AST 21  ALT 16  ALKPHOS 95  BILITOT 0.4  ALBUMIN 3.1*    Cardiac Enzymes No results for input(s): TROPONINI, PROBNP in the last 168 hours.  Glucose No results for input(s): GLUCAP in the last 168 hours.  Imaging No results found.     I personally reviewed images and agree with radiology impression as follows:  CXR:   10/26/16  Volume  loss and consolidation left upper lobe, likely post obstructive from patient's known left upper lobe lung mass    ASSESSMENT / PLAN:    1) acutely worsening sob in pt prev well compensated copd despite GOLD IV severity with only vol loss on cxr in a segment where previously had an obvious lung mass Ddx: A) post obst pna > continue abx, consider fob if willing to undergo potentially palliative RT to open back up this lobe though note we don't usually see this abrupt of sob from an upper lobe obst and also don't see typically much improvement in dyspnea when we open up and upper lobe with radiation. 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 >  Needs fluoro  C) Unrelated sourse of sob, esp occult pulmonary embolism in this patient with suspected lung cancer > rec CTa chest      2)  GOLD IV copd s/p remote smoking cessation  - prev maintained on symb and duonebs  I don't hear much wheezing at all and the pattern of her copd has been much more of an emphysematous variety so no need for prolonged steroids here -  don't believe they've made much difference this admit   3) Chronic 02 dep resp failure  s hypercarbia - rec titrate to keep sats > 90%    Discussed in detail all the  indications, usual  risks and alternatives  relative to the benefits with patient who agrees to proceed with w/u as outlined    Total time devoted to counseling  > 50 % of initial 60 min hosp visit:      Christinia Gully, MD Pulmonary and [place] [phone] After 5:30 PM or weekends, use [facility] [phone]

## 2016-10-28 NOTE — Progress Notes (Signed)
PROGRESS NOTE  Shaheen Star  YTK:[id number] DOB: 13-Jan-1946 DOA: 10/22/2016 PCP: [name], MD  Outpatient Specialists: [facility] Brief Narrative: Mahreen Schewe is a 71 y.o. female with a history of O2-dependent COPD, h/o tobacco use until 2009, and LUL mass without tissue diagnosis who presented 6/18 with respiratory distress found to have left-sided postobstructive pneumonia. She has improved mildly with antibiotics and steroids but remains significantly more dyspneic and weak than baseline. Pulmonology was consulted, recommending CTA of the chest and chest fluoroscopy to evaluate phrenic nerve functioning. Large gauge IV was not possible to be placed, so angiography was not possible, though CT chest w/contrast demonstrated advancement of local LUL and new metastatic disease.   Assessment & Plan: Principal Problem:   Community acquired pneumonia of left upper lobe of lung ([place]) Active Problems:   COPD  GOLD IV    Mass of upper lobe of left lung   Chronic respiratory failure with hypoxia (HCC)   COPD exacerbation (HCC)   Hypokalemia   Microcytic anemia   SOB (shortness of breath)  Acute on chronic hypoxemic respiratory failure: Due to pneumonia on COPD, 2L home O2-dependent since 2014.  - Continue supplemental oxygen and treatment of conditions as below - Pulmonology, Dr. Melvyn Novas consulted, appreciate recommendations.   Left upper lobe pneumonia: Possibly post-obstructive behind LUL mass noted in 2017.  - PCT reassuring, will plan to finish abx (ceftriaxone/azithromycin started 6/18) on day 7, last dose today.   - Acute debility/weakness has made her high risk for complications, falls with return to home, so plan is to discharge to SNF for rehabilitation per PT recommendations.   GOLD IV COPD: Spirometry 02/15/2016: FEV1 0.60 (28%). Ratio 38. Exacerbation not thought to be primary cause of acute decompensation, though is causing diminished pulmonary reserve.  -  Continue bronchodilators, mucolytics - Deescalate steroids IV > IM 6/25. 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 Due to high risk for complications, the patient had declined further evaluation/biopsy. CT chest 10/28/16 shows significant enlargement of LUL mass now constricting LUL bronchus in addition to metastatic LN, large splenic metastasis, and metastatic lymphadenopathy involving the pancreatic tail, upper abdomen, and paraesophageal mediastinum.  - Discussed CT results w/Dr. Melvyn Novas, plan to consider bronchoscopy.  - ?phrenic nerve compression leading to acute decompensation: Chest lfuoroscopy ordered per pulm.   Iron-deficiency anemia: workup reveals low iron saturation. - Iron supplement ordered - FOBT pending. Defer further evaluation to outpatient setting in absence of active gross bleeding.   DVT prophylaxis: Lovenox Code Status: BiPAP is ok, otherwise DNR/DNI Family Communication: None at bedside this AM. Disposition Plan: Plan to DC to SNF 6/24 once work up and management completed.  Consultants:   Pulmonology, Dr. Melvyn Novas  Procedures:   None  Antimicrobials:  Ceftriaxone 6/18 >> 6/24  Azithromycin 6/18 >>  6/24  Subjective: Remains significantly short of breath with even minimal exertion. No chest pain. No fever, +cough productive of white sputum.  Objective: Vitals:   10/28/16 0230 10/28/16 0440 10/28/16 0733 10/28/16 1330  BP:  (!) 142/67  122/85  Pulse:  81  87  Resp:  18  16  Temp:  98 F (36.7 C)  97.9 F (36.6 C)  TempSrc:  Oral  Oral  SpO2: 95% 98% 93% 97%  Weight:      Height:  Intake/Output Summary (Last 24 hours) at 10/28/16 1446 Last data filed at 10/28/16 0700  Gross per 24 hour  Intake             2455 ml  Output                0 ml  Net             2455 ml   Filed Weights   10/22/16 2129   Weight: 53.5 kg (118 lb)    Examination: General exam: Frail female in no distress laying on right side in bed. Respiratory system: Nonlabored at rest but tachypneic with accessory muscle use with even mild exertion despite oxygen by [place]. No wheezing or crackles. Decreased aeration throughout, worse on left. Cardiovascular system: Regular rate and rhythm. No murmur, rub, or gallop. No JVD, and no pedal edema. Gastrointestinal system: Abdomen soft, non-tender, non-distended, with normoactive bowel sounds. No organomegaly or masses felt. Central nervous system: Alert and oriented. No focal neurological deficits. Extremities: Warm, +clubbing without cyanosis. Skin: No rashes, lesions no ulcers Psychiatry: Judgement and insight appear normal. Mood & affect appropriate.   Data Reviewed: I have personally reviewed following labs and imaging studies  CBC:  Recent Labs Lab 10/22/16 1739  WBC 12.6*  NEUTROABS 11.5*  HGB 11.5*  HCT 36.1  MCV 77.1*  PLT 970   Basic Metabolic Panel:  Recent Labs Lab 10/22/16 1739 10/23/16 0412 10/26/16 0343  NA 139 139 141  K 3.1* 3.8 4.2  CL 108 103 105  CO2 23 28 26   GLUCOSE 122* 142* 152*  BUN 9 8 11   CREATININE 0.44 0.42* 0.41*  CALCIUM 7.9* 9.8 9.4   GFR: Estimated Creatinine Clearance: 53.4 mL/min (A) (by C-G formula based on SCr of 0.41 mg/dL (L)). Liver Function Tests:  Recent Labs Lab 10/23/16 0412  AST 21  ALT 16  ALKPHOS 95  BILITOT 0.4  PROT 7.4  ALBUMIN 3.1*   No results for input(s): LIPASE, AMYLASE in the last 168 hours. No results for input(s): AMMONIA in the last 168 hours. Coagulation Profile: No results for input(s): INR, PROTIME in the last 168 hours. Cardiac Enzymes: No results for input(s): CKTOTAL, CKMB, CKMBINDEX, TROPONINI in the last 168 hours. BNP (last 3 results) No results for input(s): PROBNP in the last 8760 hours. HbA1C: No results for input(s): HGBA1C in the last 72 hours. CBG: No results for  input(s): GLUCAP in the last 168 hours. Lipid Profile: No results for input(s): CHOL, HDL, LDLCALC, TRIG, CHOLHDL, LDLDIRECT in the last 72 hours. Thyroid Function Tests: No results for input(s): TSH, T4TOTAL, FREET4, T3FREE, THYROIDAB in the last 72 hours. Anemia Panel: No results for input(s): VITAMINB12, FOLATE, FERRITIN, TIBC, IRON, RETICCTPCT in the last 72 hours. Urine analysis: No results found for: COLORURINE, APPEARANCEUR, LABSPEC, PHURINE, GLUCOSEU, HGBUR, [place], [place], PROTEINUR, UROBILINOGEN, NITRITE, LEUKOCYTESUR Recent Results (from the past 240 hour(s))  Culture, sputum-assessment     Status: None   Collection Time: 10/22/16 11:55 PM  Result Value Ref Range Status   Specimen Description SPUTUM  Final   Special Requests NONE  Final   Sputum evaluation THIS SPECIMEN IS ACCEPTABLE FOR SPUTUM CULTURE  Final   Report Status 10/23/2016 FINAL  Final  Culture, respiratory (NON-Expectorated)     Status: None   Collection Time: 10/22/16 11:55 PM  Result Value Ref Range Status   Specimen Description SPUTUM  Final   Special Requests NONE Reflexed from [id number]  Final   Gram Stain  Final    ABUNDANT WBC PRESENT, PREDOMINANTLY PMN RARE SQUAMOUS EPITHELIAL CELLS PRESENT FEW GRAM POSITIVE COCCI IN PAIRS FEW GRAM NEGATIVE COCCOBACILLI RARE GRAM POSITIVE RODS    Culture   Final    Consistent with normal respiratory flora. Performed at [facility], [place] [address] [place], [place] [address]    Report Status 10/25/2016 FINAL  Final  Respiratory Panel by PCR     Status: None   Collection Time: 10/23/16  1:49 PM  Result Value Ref Range Status   Adenovirus NOT DETECTED NOT DETECTED Final   Coronavirus 229E NOT DETECTED NOT DETECTED Final   Coronavirus HKU1 NOT DETECTED NOT DETECTED Final   Coronavirus NL63 NOT DETECTED NOT DETECTED Final   Coronavirus OC43 NOT DETECTED NOT DETECTED Final   Metapneumovirus NOT DETECTED NOT DETECTED Final   Rhinovirus / Enterovirus  NOT DETECTED NOT DETECTED Final   Influenza A NOT DETECTED NOT DETECTED Final   Influenza B NOT DETECTED NOT DETECTED Final   Parainfluenza Virus 1 NOT DETECTED NOT DETECTED Final   Parainfluenza Virus 2 NOT DETECTED NOT DETECTED Final   Parainfluenza Virus 3 NOT DETECTED NOT DETECTED Final   Parainfluenza Virus 4 NOT DETECTED NOT DETECTED Final   Respiratory Syncytial Virus NOT DETECTED NOT DETECTED Final   Bordetella pertussis NOT DETECTED NOT DETECTED Final   Chlamydophila pneumoniae NOT DETECTED NOT DETECTED Final   Mycoplasma pneumoniae NOT DETECTED NOT DETECTED Final    Comment: Performed at [facility], [place] [address], [place], [place] [address]      Radiology Studies: Ct Chest W Contrast  Result Date: 10/28/2016 CLINICAL DATA:  LEFT upper lobe mass presumably bronchogenic carcinoma. PET-CT 02/28/2016. No interval intervention per patient request. EXAM: CT CHEST WITH CONTRAST TECHNIQUE: Multidetector CT imaging of the chest was performed during intravenous contrast administration. CONTRAST:  25mL ISOVUE-300 IOPAMIDOL (ISOVUE-300) INJECTION 61% COMPARISON:  PET-CT 02/28/2016 FINDINGS: Cardiovascular: Coronary artery calcification and aortic atherosclerotic calcification. Mediastinum/Nodes: No axillary or supraclavicular adenopathy. Interval enlargement AP window node measuring 22 mm increased from 12 mm. Lungs/Pleura: LEFT upper lobe suprahilar mass is increased in size measuring 9.3 x 6.3 cm increased from 5.6 x 5.7 cm. The mass surrounds the LEFT upper lobe bronchus with collapse of the LEFT upper lobe. Difficult to separate postobstructive collapse versus tumor. Favor majority of the consolidation in the LEFT upper lobe to represent tumor. No contralateral pulmonary metastasis.  Emphysema the upper lobes. Upper Abdomen: New large enhancing mass lesion in the spleen measuring 7 cm x 7.7 cm New metastatic adenopathy adjacent to the pancreatic tail measuring 2.5 cm (image 145, series  2). Additional smaller nodal metastasis in the gastrosplenic ligament Cysts within the LEFT hepatic lobe is stable. Other small hypodensities unchanged. No adrenal mass identified. New lesion along the distal esophagus measuring 12 mm by 16 mm (image 117, series 2) is favored a metastatic lymph node partially compressing the esophagus Musculoskeletal: No aggressive osseous lesion. IMPRESSION: 1. 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 2. Marked increase in size of AP window metastatic lymph node. 3. New large 7 cm metastatic lesion to the spleen. 4. New metastatic adenopathy along the pancreatic tail, upper abdomen, and middle mediastinal adjacent to the distal esophagus. These results will be called to the ordering clinician or representative by the Radiologist Assistant, and  communication documented in the PACS or zVision Dashboard. Electronically Signed   By: Suzy Bouchard M.D.   On: 10/28/2016 13:43    Scheduled Meds: . amLODipine  2.5 mg Oral Daily  . arformoterol  15 mcg Nebulization BID  . atorvastatin  20 mg Oral QHS  . azithromycin  500 mg Oral Q24H  . [START ON 10/29/2016] dexamethasone  10 mg Intramuscular Once  . enoxaparin (LOVENOX) injection  40 mg Subcutaneous Q24H  . feeding supplement (ENSURE ENLIVE)  237 mL Oral TID BM  . ferrous sulfate  325 mg Oral BID WC  . guaiFENesin  1,200 mg Oral BID  . iopamidol      . ipratropium-albuterol  3 mL Nebulization Q6H   Continuous Infusions: . 0.9 % NaCl with KCl 20 mEq / L 75 mL/hr at 10/28/16 0600  . cefTRIAXone (ROCEPHIN)  IV Stopped (10/27/16 2045)     LOS: 6 days   Time spent: 25 minutes.  Vance Gather, MD [facility] Pager [phone]  If 7PM-7AM, please contact night-coverage [email or web address] Password  [facility] 10/28/2016, 2:46 PM

## 2016-10-29 ENCOUNTER — Inpatient Hospital Stay (HOSPITAL_COMMUNITY): Payer: Medicare Other

## 2016-10-29 DIAGNOSIS — J9601 Acute respiratory failure with hypoxia: Secondary | ICD-10-CM

## 2016-10-29 LAB — BASIC METABOLIC PANEL
ANION GAP: 9 (ref 5–15)
BUN: 7 mg/dL (ref 6–20)
CO2: 25 mmol/L (ref 22–32)
Calcium: 9 mg/dL (ref 8.9–10.3)
Chloride: 104 mmol/L (ref 101–111)
Creatinine, Ser: 0.35 mg/dL — ABNORMAL LOW (ref 0.44–1.00)
GFR calc Af Amer: >60 mL/min (ref >60)
GFR calc non Af Amer: >60 mL/min (ref >60)
GLUCOSE: 106 mg/dL — AB (ref 65–99)
POTASSIUM: 3.7 mmol/L (ref 3.5–5.1)
Sodium: 138 mmol/L (ref 135–145)

## 2016-10-29 LAB — CBC
HEMATOCRIT: 37.4 % (ref 36.0–46.0)
Hemoglobin: 12 g/dL (ref 12.0–15.0)
MCH: 24 pg — AB (ref 26.0–34.0)
MCHC: 32.1 g/dL (ref 30.0–36.0)
MCV: 74.9 fL — AB (ref 78.0–100.0)
Platelets: 234 10*3/uL (ref 150–400)
RBC: 4.99 MIL/uL (ref 3.87–5.11)
RDW: 16.3 % — ABNORMAL HIGH (ref 11.5–15.5)
WBC: 9.5 10*3/uL (ref 4.0–10.5)

## 2016-10-29 MED ORDER — TECHNETIUM TO 99M ALBUMIN AGGREGATED
4.3000 | Freq: Once | INTRAVENOUS | Status: AC | PRN
  Administered 2016-10-29: 4.3 via INTRAVENOUS

## 2016-10-29 MED ORDER — TECHNETIUM TC 99M DIETHYLENETRIAME-PENTAACETIC ACID: 29.0000 | Freq: Once | INTRAVENOUS | Status: DC | PRN

## 2016-10-29 NOTE — Progress Notes (Signed)
Pt from home alone with home 02. For SNF placement at discharge. Marney Doctor RN,BSN,NCM [phone]

## 2016-10-29 NOTE — Progress Notes (Signed)
PROGRESS NOTE  Brooke [name]  MAU:[id number] DOB: 05/17/1945 DOA: 10/22/2016 PCP: [name], MD  Outpatient Specialists: [facility] Brief Narrative: Brooke [name] is a 71 y.o. female with a history of O2-dependent COPD, h/o tobacco use until 2009, and LUL mass without tissue diagnosis who presented 6/18 with respiratory distress found to have left-sided postobstructive pneumonia. She has improved mildly with antibiotics and steroids but remains significantly more dyspneic and weak than baseline. Pulmonology was consulted, recommending CTA of the chest and chest fluoroscopy to evaluate phrenic nerve functioning. Large gauge IV was not possible to be placed, so angiography was not possible, though CT chest w/contrast demonstrated advancement of local LUL and new metastatic disease.   Assessment & Plan: Principal Problem:   Community acquired pneumonia of left upper lobe of lung ([place]) Active Problems:   COPD  GOLD IV    Mass of upper lobe of left lung   Chronic respiratory failure with hypoxia (HCC)   COPD exacerbation (HCC)   Hypokalemia   Microcytic anemia   SOB (shortness of breath)  Acute on chronic hypoxemic respiratory failure: Due to pneumonia on COPD, 2L home O2-dependent since 2014.  - Continue supplemental oxygen and treatment of conditions as below - Pulmonology, consulted, appreciate recommendations.   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 Due to high risk for complications, the patient had declined further evaluation/biopsy. CT chest 10/28/16 shows significant enlargement of LUL mass now constricting LUL bronchus in addition to metastatic LN, large splenic metastasis, and metastatic lymphadenopathy involving the pancreatic tail, upper abdomen, and paraesophageal mediastinum.  - Discussed CT results w/patient at length. Continues to consider bronchoscopy, though any treatment is likely to be palliative. -  ?phrenic nerve compression leading to acute decompensation: Chest fluoroscopy ordered per pulm.  - V/Q scan today  Left upper lobe pneumonia: Possibly post-obstructive behind LUL mass noted in 2017.  - PCT reassuring, will plan to finish abx (ceftriaxone/azithromycin started 6/18) on day 7, last dose today.   - Acute debility/weakness has made her high risk for complications, falls with return to home, so plan is to discharge to SNF for rehabilitation per PT recommendations.   GOLD IV COPD: Spirometry 02/15/2016: FEV1 0.60 (28%). Ratio 38. Exacerbation not thought to be primary cause of acute decompensation, though is causing diminished pulmonary reserve.  - Continue bronchodilators, mucolytics - Deescalate steroids IV > IM 6/25. 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  Iron-deficiency anemia: workup reveals low iron saturation. - Iron supplement ordered - FOBT pending. Defer further evaluation to outpatient setting in absence of active gross bleeding.   DVT prophylaxis: Lovenox Code Status: BiPAP is ok, otherwise DNR/DNI Family Communication: None at bedside this AM. Disposition Plan: DC to SNF once evaluation is complete  Consultants:   Pulmonology, Dr. Melvyn Novas > Dr. [place] Bells  Procedures:   None  Antimicrobials:  Ceftriaxone 6/18 >> 6/24  Azithromycin 6/18 >>  6/24  Subjective: Dyspnea about the same, severe, worse than baseline. Denies fever, chills, chest pain. White sputum with cough is improving.  Objective: Vitals:   10/28/16 1330 10/28/16 2103 10/29/16 0445 10/29/16 0737  BP: 122/85 126/83 133/76   Pulse: 87 83 82   Resp: 16 18 16    Temp: 97.9 F (36.6 C) 98.3 F (36.8 C) 98.8 F (37.1 C)   TempSrc: Oral Oral Oral   SpO2: 97% 98% 99% 97%  Weight:  Height:        Intake/Output Summary (Last 24 hours) at 10/29/16 1316 Last data filed at 10/28/16 1948  Gross per 24 hour  Intake             1560  ml  Output                0 ml  Net             1560 ml   Filed Weights   10/22/16 2129  Weight: 53.5 kg (118 lb)    Examination: General exam: Frail female in no distress laying on right side in bed. Respiratory system: Nonlabored at rest but tachypneic with accessory muscle use with even mild exertion (e.g. assisted rise to seated position) despite oxygen by [place]. No wheezing or crackles. Decreased aeration throughout, worse on left. Dullness to percussion on left.  Cardiovascular system: Regular rate and rhythm. No murmur, rub, or gallop. No JVD, and no pedal edema. Gastrointestinal system: Abdomen soft, non-tender, non-distended, with normoactive bowel sounds. No organomegaly or masses felt. Central nervous system: Alert and oriented. No focal neurological deficits. Extremities: Warm, +clubbing without cyanosis. Skin: No rashes, lesions no ulcers Psychiatry: Judgement and insight appear normal. Mood & affect appropriate.   Data Reviewed: I have personally reviewed following labs and imaging studies  CBC:  Recent Labs Lab 10/22/16 1739 10/29/16 0439  WBC 12.6* 9.5  NEUTROABS 11.5*  --   HGB 11.5* 12.0  HCT 36.1 37.4  MCV 77.1* 74.9*  PLT 268 093   Basic Metabolic Panel:  Recent Labs Lab 10/22/16 1739 10/23/16 0412 10/26/16 0343 10/29/16 0439  NA 139 139 141 138  K 3.1* 3.8 4.2 3.7  CL 108 103 105 104  CO2 23 28 26 25   GLUCOSE 122* 142* 152* 106*  BUN 9 8 11 7   CREATININE 0.44 0.42* 0.41* 0.35*  CALCIUM 7.9* 9.8 9.4 9.0   GFR: Estimated Creatinine Clearance: 53.4 mL/min (A) (by C-G formula based on SCr of 0.35 mg/dL (L)). Liver Function Tests:  Recent Labs Lab 10/23/16 0412  AST 21  ALT 16  ALKPHOS 95  BILITOT 0.4  PROT 7.4  ALBUMIN 3.1*   No results for input(s): LIPASE, AMYLASE in the last 168 hours. No results for input(s): AMMONIA in the last 168 hours. Coagulation Profile: No results for input(s): INR, PROTIME in the last 168 hours. Cardiac  Enzymes: No results for input(s): CKTOTAL, CKMB, CKMBINDEX, TROPONINI in the last 168 hours. BNP (last 3 results) No results for input(s): PROBNP in the last 8760 hours. HbA1C: No results for input(s): HGBA1C in the last 72 hours. CBG: No results for input(s): GLUCAP in the last 168 hours. Lipid Profile: No results for input(s): CHOL, HDL, LDLCALC, TRIG, CHOLHDL, LDLDIRECT in the last 72 hours. Thyroid Function Tests: No results for input(s): TSH, T4TOTAL, FREET4, T3FREE, THYROIDAB in the last 72 hours. Anemia Panel: No results for input(s): VITAMINB12, FOLATE, FERRITIN, TIBC, IRON, RETICCTPCT in the last 72 hours. Urine analysis: No results found for: COLORURINE, APPEARANCEUR, LABSPEC, PHURINE, GLUCOSEU, HGBUR, [place], [place], PROTEINUR, UROBILINOGEN, NITRITE, LEUKOCYTESUR Recent Results (from the past 240 hour(s))  Culture, sputum-assessment     Status: None   Collection Time: 10/22/16 11:55 PM  Result Value Ref Range Status   Specimen Description SPUTUM  Final   Special Requests NONE  Final   Sputum evaluation THIS SPECIMEN IS ACCEPTABLE FOR SPUTUM CULTURE  Final   Report Status 10/23/2016 FINAL  Final  Culture, respiratory (NON-Expectorated)  Status: None   Collection Time: 10/22/16 11:55 PM  Result Value Ref Range Status   Specimen Description SPUTUM  Final   Special Requests NONE Reflexed from [id number]  Final   Gram Stain   Final    ABUNDANT WBC PRESENT, PREDOMINANTLY PMN RARE SQUAMOUS EPITHELIAL CELLS PRESENT FEW GRAM POSITIVE COCCI IN PAIRS FEW GRAM NEGATIVE COCCOBACILLI RARE GRAM POSITIVE RODS    Culture   Final    Consistent with normal respiratory flora. Performed at [facility], [place] [address] [place], [place] [address]    Report Status 10/25/2016 FINAL  Final  Respiratory Panel by PCR     Status: None   Collection Time: 10/23/16  1:49 PM  Result Value Ref Range Status   Adenovirus NOT DETECTED NOT DETECTED Final   Coronavirus 229E NOT  DETECTED NOT DETECTED Final   Coronavirus HKU1 NOT DETECTED NOT DETECTED Final   Coronavirus NL63 NOT DETECTED NOT DETECTED Final   Coronavirus OC43 NOT DETECTED NOT DETECTED Final   Metapneumovirus NOT DETECTED NOT DETECTED Final   Rhinovirus / Enterovirus NOT DETECTED NOT DETECTED Final   Influenza A NOT DETECTED NOT DETECTED Final   Influenza B NOT DETECTED NOT DETECTED Final   Parainfluenza Virus 1 NOT DETECTED NOT DETECTED Final   Parainfluenza Virus 2 NOT DETECTED NOT DETECTED Final   Parainfluenza Virus 3 NOT DETECTED NOT DETECTED Final   Parainfluenza Virus 4 NOT DETECTED NOT DETECTED Final   Respiratory Syncytial Virus NOT DETECTED NOT DETECTED Final   Bordetella pertussis NOT DETECTED NOT DETECTED Final   Chlamydophila pneumoniae NOT DETECTED NOT DETECTED Final   Mycoplasma pneumoniae NOT DETECTED NOT DETECTED Final    Comment: Performed at [facility], [place] [address], [place], [place] [address]      Radiology Studies: Ct Chest W Contrast  Result Date: 10/28/2016 CLINICAL DATA:  LEFT upper lobe mass presumably bronchogenic carcinoma. PET-CT 02/28/2016. No interval intervention per patient request. EXAM: CT CHEST WITH CONTRAST TECHNIQUE: Multidetector CT imaging of the chest was performed during intravenous contrast administration. CONTRAST:  59mL ISOVUE-300 IOPAMIDOL (ISOVUE-300) INJECTION 61% COMPARISON:  PET-CT 02/28/2016 FINDINGS: Cardiovascular: Coronary artery calcification and aortic atherosclerotic calcification. Mediastinum/Nodes: No axillary or supraclavicular adenopathy. Interval enlargement AP window node measuring 22 mm increased from 12 mm. Lungs/Pleura: LEFT upper lobe suprahilar mass is increased in size measuring 9.3 x 6.3 cm increased from 5.6 x 5.7 cm. The mass surrounds the LEFT upper lobe bronchus with collapse of the LEFT upper lobe. Difficult to separate postobstructive collapse versus tumor. Favor majority of the consolidation in the LEFT upper lobe to  represent tumor. No contralateral pulmonary metastasis.  Emphysema the upper lobes. Upper Abdomen: New large enhancing mass lesion in the spleen measuring 7 cm x 7.7 cm New metastatic adenopathy adjacent to the pancreatic tail measuring 2.5 cm (image 145, series 2). Additional smaller nodal metastasis in the gastrosplenic ligament Cysts within the LEFT hepatic lobe is stable. Other small hypodensities unchanged. No adrenal mass identified. New lesion along the distal esophagus measuring 12 mm by 16 mm (image 117, series 2) is favored a metastatic lymph node partially compressing the esophagus Musculoskeletal: No aggressive osseous lesion. IMPRESSION: 1. 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 2. Marked increase in size of AP window metastatic lymph node. 3. New large 7 cm metastatic  lesion to the spleen. 4. New metastatic adenopathy along the pancreatic tail, upper abdomen, and middle mediastinal adjacent to the distal esophagus. These results will be called to the ordering clinician or representative by the Radiologist Assistant, and communication documented in the PACS or zVision Dashboard. Electronically Signed   By: Suzy Bouchard M.D.   On: 10/28/2016 13:43    Scheduled Meds: . amLODipine  2.5 mg Oral Daily  . arformoterol  15 mcg Nebulization BID  . atorvastatin  20 mg Oral QHS  . enoxaparin (LOVENOX) injection  40 mg Subcutaneous Q24H  . feeding supplement (ENSURE ENLIVE)  237 mL Oral TID BM  . ferrous sulfate  325 mg Oral BID WC  . guaiFENesin  1,200 mg Oral BID  . ipratropium-albuterol  3 mL Nebulization Q6H   Continuous Infusions: . 0.9 % NaCl with KCl 20 mEq / L 75 mL/hr at 10/29/16 1242     LOS: 7 days   Time spent: 25 minutes.  Vance Gather, MD [facility] Pager  [phone]  If 7PM-7AM, please contact night-coverage [email or web address] Password [email or web address] 10/29/2016, 1:16 PM

## 2016-10-29 NOTE — Progress Notes (Signed)
OT Cancellation Note  Patient Details Name: Brooke [name] MRN: [id number] DOB: Jan 25, 1946   Cancelled Treatment:    Reason Eval/Treat Not Completed: Fatigue/lethargy limiting ability to participate  [place], OT/L  [phone] 10/29/2016 10/29/2016, 1:51 PM

## 2016-10-29 NOTE — Progress Notes (Signed)
Physical Therapy Treatment Patient Details Name: Brooke [name] MRN: [id number] DOB: 1945-10-31 Today's Date: 10/29/2016    History of Present Illness Pt was admitted for CAP.  H/O COPD    PT Comments    Instructed pt in seated and lying exercises to be done independently to minimize deconditioning. SaO2 91% on 2.5L O2 with activity, pt requires frequent rest breaks due to SOB. Performed bed to [facility] transfers. Gait deferred 2* pt scheduled for perf and vent to r/o DVT, as well as pt fatigue.   Follow Up Recommendations  SNF     Equipment Recommendations  None recommended by PT    Recommendations for Other Services       Precautions / Restrictions Precautions Precautions: Fall Precaution Comments: monitor O2 sats Restrictions Weight Bearing Restrictions: No    Mobility  Bed Mobility Overal bed mobility: Modified Independent             General bed mobility comments: with rail  Transfers Overall transfer level: Needs assistance   Transfers: Squat Pivot Transfers     Squat pivot transfers: Supervision     General transfer comment: pt performed SPT to [facility] then back to bed, used rails of BSC for support; SaO2 91% on 2.5L O2 with activity, pt requires frequent rest breaks 2* WOB  Ambulation/Gait             General Gait Details: pt unable d/t incr WOB   Stairs            Wheelchair Mobility    Modified Rankin (Stroke Patients Only)       Balance Overall balance assessment: Needs assistance Sitting-balance support: Feet supported;Feet unsupported;Single extremity supported Sitting balance-Leahy Scale: Good     Standing balance support: No upper extremity supported;Single extremity supported Standing balance-Leahy Scale: Fair                              Cognition Arousal/Alertness: Awake/alert Behavior During Therapy: WFL for tasks assessed/performed Overall Cognitive Status: Within Functional Limits for tasks  assessed                                        Exercises General Exercises - Upper Extremity Shoulder Flexion: AROM;Both;Seated;5 reps General Exercises - Lower Extremity Ankle Circles/Pumps: AROM;Both;10 reps;Supine Quad Sets: AROM;Both;5 reps;Supine Gluteal Sets: AROM;Both;5 reps;Supine Long Arc Quad: AROM;Both;5 reps;Seated Hip Flexion/Marching: AROM;Both;5 reps;Seated    General Comments        Pertinent Vitals/Pain Pain Score: 5  Pain Location: L posterior chest Pain Descriptors / Indicators: Dull;Aching Pain Intervention(s): Limited activity within patient's tolerance;Monitored during session;Other (comment) (gentle massage)    Home Living                      Prior Function            PT Goals (current goals can now be found in the care plan section) Acute Rehab PT Goals Patient Stated Goal: get back to being independent PT Goal Formulation: With patient Time For Goal Achievement: 11/09/16 Potential to Achieve Goals: Fair Progress towards PT goals: Progressing toward goals    Frequency    Min 3X/week      PT Plan Current plan remains appropriate    Co-evaluation              AM-PAC PT "6  Clicks" Daily Activity  Outcome Measure  Difficulty turning over in bed (including adjusting bedclothes, sheets and blankets)?: A Little Difficulty moving from lying on back to sitting on the side of the bed? : A Little Difficulty sitting down on and standing up from a chair with arms (e.g., wheelchair, bedside commode, etc,.)?: A Lot Help needed moving to and from a bed to chair (including a wheelchair)?: A Little Help needed walking in hospital room?: Total Help needed climbing 3-5 steps with a railing? : Total 6 Click Score: 13    End of Session Equipment Utilized During Treatment: Gait belt;Oxygen Activity Tolerance: Patient limited by fatigue Patient left: in bed Nurse Communication: Mobility status PT Visit Diagnosis:  Muscle weakness (generalized) (M62.81);Difficulty in walking, not elsewhere classified (R26.2)     Time: 2505-3976 PT Time Calculation (min) (ACUTE ONLY): 26 min  Charges:  $Therapeutic Exercise: 8-22 mins $Therapeutic Activity: 8-22 mins                    G Codes:          Philomena Doheny 10/29/2016, 12:24 PM [phone]

## 2016-10-29 NOTE — Progress Notes (Signed)
Returned from [place] with her PIV leaking. Bloody hand gown and her hand on a towel, PIV bleeding. I spoke to Jocelyn Lamer the Freight forwarder in [place] or IR regarding the arrival of Ms Dossantos being in this mess. PIV removed and pt cleaned and a new PIV inserted

## 2016-10-29 NOTE — Progress Notes (Signed)
[facility]  S: Dyspnea about the same O:. Vitals:   10/28/16 1330 10/28/16 2103 10/29/16 0445 10/29/16 0737  BP: 122/85 126/83 133/76   Pulse: 87 83 82   Resp: 16 18 16    Temp: 97.9 F (36.6 C) 98.3 F (36.8 C) 98.8 F (37.1 C)   TempSrc: Oral Oral Oral   SpO2: 97% 98% 99% 97%  Weight:      Height:       2LNC  General:  Resting comfortably in bed HENT: NCAT OP clear PULM: poor air movement on left, clear on right, normal effort CV: RRR, no mgr GI: BS+, soft, nontender MSK: normal bulk and tone Neuro: awake, alert, no distress, MAEW  CT chest images reviewed: emphysema bilaterally, left upper lobe mass, left sided mediastinal adenopathy  CBC    Component Value Date/Time   WBC 9.5 10/29/2016 0439   RBC 4.99 10/29/2016 0439   HGB 12.0 10/29/2016 0439   HCT 37.4 10/29/2016 0439   PLT 234 10/29/2016 0439   MCV 74.9 (L) 10/29/2016 0439   MCH 24.0 (L) 10/29/2016 0439   MCHC 32.1 10/29/2016 0439   RDW 16.3 (H) 10/29/2016 0439   LYMPHSABS 0.6 (L) 10/22/2016 1739   MONOABS 0.4 10/22/2016 1739   EOSABS 0.0 10/22/2016 1739   BASOSABS 0.0 10/22/2016 1739   Impression/plan: Acute respiratory failure with hypoxemia> unclear to me if what we see on CT is pneumonia or not considering her lack of fever, mucus production.  I agree with Dr. Melvyn Novas that we should assess for a PE, will order V/Q scan.  Continue pneumonia treatment as you are doing. Continue O2 supplementation.  I had another frank conversation with her about having a bronchoscopy.  She is still uncertain as to whether or not she wants cancer treatment.    Will revisit tomorrow.  Roselie Awkward, MD [facility] Pager: [phone] Cell: [phone] After 3pm or if no response, call [phone]

## 2016-10-30 DIAGNOSIS — J984 Other disorders of lung: Secondary | ICD-10-CM

## 2016-10-30 MED ORDER — MOMETASONE FURO-FORMOTEROL FUM 200-5 MCG/ACT IN AERO
2.0000 | INHALATION_SPRAY | Freq: Two times a day (BID) | RESPIRATORY_TRACT | Status: DC
  Administered 2016-10-30 – 2016-11-02 (×6): 2 via RESPIRATORY_TRACT
  Filled 2016-10-30: qty 8.8

## 2016-10-30 NOTE — Consult Note (Signed)
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 She presented to the emergency room 10/22/2016 with dyspnea that has progressed over a few weeks. A chest x-ray revealed consolidation in the left upper lung felt to potentially represent postobstructive pneumonia She had a left lung mass noted on CT and PET evaluation in October 2017. An Ipsilateral hypermetabolic mediastinal lymph node were noted. No evidence of distant metastatic disease.  She was referred to Dr. Melvyn Novas and declined a biopsy.  Brooke [name] was admitted 10/22/2016 and treated with antibiotics. Pulmonary medicine was consulted. A CT of the chest 10/28/2016 revealed progression of the left upper lobe mass with constriction of the left or below bronchus. Increased AP window lymphadenopathy. 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  Drs.Wert an Mcquaid have evaluated her in the hospital. She saw Dr. Lake Bells today to discuss bronchoscopy.     Past Medical History:  Diagnosis Date  . Chronic respiratory failure (HCC)    a. on home O2.  Marland Kitchen COPD (chronic obstructive pulmonary disease) ([place])    a. Home O2.  . Former tobacco use   . GERD (gastroesophageal reflux disease)   . Hyperlipidemia   . NSTEMI (non-ST elevated myocardial infarction) ([place])    a. 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 Coronary vasospasm  :  Past Surgical History:  Procedure Laterality Date  . LEFT HEART CATHETERIZATION WITH CORONARY ANGIOGRAM N/A 06/08/2013   Procedure: LEFT HEART CATHETERIZATION WITH CORONARY ANGIOGRAM;  Surgeon: Sanda Klein, MD;  Location: [facility];  Service: Cardiovascular;  Laterality: N/A;   .  Tonsillectomy as a child    Current Facility-Administered Medications:  .   0.9 % NaCl with KCl 20 mEq/ L  infusion, , Intravenous, Continuous, Rizwan, Saima, MD, Last Rate: 75 mL/hr at 10/30/16 0322, 1,000 mL at 10/30/16 0322 .  acetaminophen (TYLENOL) tablet 650 mg, 650 mg, Oral, Q6H PRN **OR** acetaminophen (TYLENOL) suppository 650 mg, 650 mg, Rectal, Q6H PRN, Danford, Christopher P, MD .  albuterol (PROVENTIL) (2.5 MG/3ML) 0.083% nebulizer solution 2.5 mg, 2.5 mg, Nebulization, Q2H PRN, Danford, Suann Larry, MD, 2.5 mg at 10/29/16 1654 .  ALPRAZolam (XANAX) tablet 0.125 mg, 0.125 mg, Oral, TID PRN, [place] Brick, Courage, MD, 0.125 mg at 10/30/16 1106 .  amLODipine (NORVASC) tablet 2.5 mg, 2.5 mg, Oral, Daily, Danford, Suann Larry, MD, 2.5 mg at 10/30/16 1107 .  atorvastatin (LIPITOR) tablet 20 mg, 20 mg, Oral, QHS, Danford, Suann Larry, MD, 20 mg at 10/29/16 2145 .  enoxaparin (LOVENOX) injection 40 mg, 40 mg, Subcutaneous, Q24H, Danford, Suann Larry, MD, 40 mg at 10/29/16 2145 .  feeding supplement (ENSURE ENLIVE) (ENSURE ENLIVE) liquid 237 mL, 237 mL, Oral, TID BM, Emokpae, Courage, MD, 237 mL at 10/30/16 1000 .  ferrous sulfate tablet 325 mg, 325 mg, Oral, BID WC, Rizwan, Saima, MD, 325 mg at 10/29/16 0946 .  guaiFENesin (MUCINEX) 12 hr tablet 1,200 mg, 1,200 mg, Oral, BID, Rizwan, Saima, MD, 1,200 mg at 10/30/16 1107 .  ipratropium-albuterol (DUONEB) 0.5-2.5 (3) MG/3ML nebulizer solution 3 mL, 3 mL, Nebulization, Q6H, Vance Gather B, MD, 3 mL at 10/30/16 0818 .  mometasone-formoterol (DULERA) 200-5 MCG/ACT inhaler 2 puff, 2 puff, Inhalation, BID, Simonne Maffucci B, MD .  ondansetron [facility]) tablet  4 mg, 4 mg, Oral, Q6H PRN, 4 mg at 10/29/16 0335 **OR** ondansetron (ZOFRAN) injection 4 mg, 4 mg, Intravenous, Q6H PRN, Danford, Suann Larry, MD .  technetium TC 87M diethylenetriame-pentaacetic acid (DTPA) injection 29 millicurie, 29 millicurie, Intravenous, Once PRN, Misty Stanley, MD:  . amLODipine  2.5 mg Oral Daily  . atorvastatin  20 mg Oral QHS  .  enoxaparin (LOVENOX) injection  40 mg Subcutaneous Q24H  . feeding supplement (ENSURE ENLIVE)  237 mL Oral TID BM  . ferrous sulfate  325 mg Oral BID WC  . guaiFENesin  1,200 mg Oral BID  . ipratropium-albuterol  3 mL Nebulization Q6H  . mometasone-formoterol  2 puff Inhalation BID  :  Allergies  Allergen Reactions  . Codeine Nausea And Vomiting  . Imdur [Isosorbide Dinitrate]     headache  . Prednisone Other (See Comments)    Reaction:Abnormal behavior; cannot take in pill form but CAN tolerate the injection  :  FH:Her father had lung cancer and was a nonsmoker. No other family history of cancer.   SOCIAL HISTORY: she lives alone in [place]. She worked as an Catering manager. She quit smoking cigarettes. She does not use alcohol.  No transfusion history. No risk factor for HIV or hepatitis.   Review of Systems:  Positives include: Increased exertional dyspnea for the past 2 weeks, discomfort at the left upper back-relieved with massage by physical therapy, diplopia in the mornings since undergoing cataract surgery, anorexia, cough  A complete ROS was otherwise negative.   Physical Exam:  Blood pressure 115/75, pulse 88, temperature 98.1 F (36.7 C), temperature source Oral, resp. rate 18, height 5\' 3"  (1.6 m), weight 118 lb (53.5 kg), SpO2 96 %.  HEENT: No thrush, neck without mass  Lungs: Distant breath sounds with decreased breath sounds at the left compared to the right chest  Cardiac: Regular rate and rhythm  Abdomen: No hepatosplenomegaly  Vascular: No leg edema  Lymph nodes: No cervical, supraclavicular, axillary, or inguinal nodes  Neurologic: Alert and oriented, the motor exam appears intact in the upper and lower extremities  Skin: No rash  Musculoskeletal: No spine tenderness  LABS:   Recent Labs  10/29/16 0439  WBC 9.5  HGB 12.0  HCT 37.4  PLT 234     Recent Labs  10/29/16 0439  NA 138  K 3.7  CL 104   CO2 25  GLUCOSE 106*  BUN 7  CREATININE 0.35*  CALCIUM 9.0      RADIOLOGY:  Dg Chest 2 View  Result Date: 10/29/2016 CLINICAL DATA:  Shortness of breath EXAM: CHEST  2 VIEW COMPARISON:  CT chest 10/28/2016 FINDINGS: Large left upper lobe pulmonary mass. No other pulmonary masses identified. No pleural effusion or pneumothorax. Stable heart size. No acute osseous abnormality. IMPRESSION: Large left upper lobe pulmonary mass most consistent with primary lung malignancy. Electronically Signed   By: Kathreen Devoid   On: 10/29/2016 15:40   Dg Chest 2 View  Result Date: 10/26/2016 CLINICAL DATA:  Shortness of breath EXAM: CHEST  2 VIEW COMPARISON:  10/22/2016 FINDINGS: Collapse/ consolidation in the left upper lung persists in a similar fashion. Patient has known left upper lobe pulmonary mass and features remain compatible with postobstructive process. Right lung appears mildly hyperexpanded but clear. Nodular density projecting over the right lung base is compatible with nipple shadow. The cardiopericardial silhouette is within normal limits for size. Bones are diffusely demineralized. IMPRESSION: Stable exam. Volume loss and consolidation  left upper lobe, likely post obstructive from patient's known left upper lobe lung mass. Electronically Signed   By: Misty Stanley M.D.   On: 10/26/2016 11:29   Dg Chest 2 View  Result Date: 10/22/2016 CLINICAL DATA:  Acute onset of shortness of breath and congestion. Initial encounter. EXAM: CHEST  2 VIEW COMPARISON:  Chest radiograph performed 01/19/2016, and PET/CT performed 02/28/2016 FINDINGS: There is new consolidation of the left upper lung zone, obscuring the previously noted mass. This likely reflects postobstructive pneumonia. There is no evidence of pleural effusion or pneumothorax. The heart is normal in size; the mediastinal contour is within normal limits. No acute osseous abnormalities are seen. IMPRESSION: New consolidation of the left upper lung  zone, obscuring the previously noted mass. This likely reflects postobstructive pneumonia. Electronically Signed   By: Garald Balding M.D.   On: 10/22/2016 19:07   Dg Chest Fluoro  Result Date: 10/29/2016 CLINICAL DATA:  Large left upper lobe mass with mediastinal involvement. Clinical concern for phrenic nerve dysfunction. EXAM: CHEST FLUOROSCOPY TECHNIQUE: Real-time fluoroscopic evaluation of the chest was performed. FLUOROSCOPY TIME:  Fluoroscopy Time:  0 minutes and 12 seconds. Radiation Exposure Index (if provided by the fluoroscopic device): 10 mGy Number of Acquired Spot Images: 0 COMPARISON:  None. FINDINGS: With a sniff maneuver, there is appropriate and expected bilateral hemi diaphragmatic excursion. No evidence for paradoxical hemi diaphragmatic motion. IMPRESSION: Normal motion of each hemidiaphragm with sniff maneuver. Electronically Signed   By: Misty Stanley M.D.   On: 10/29/2016 16:41   Ct Chest W Contrast  Result Date: 10/28/2016 CLINICAL DATA:  LEFT upper lobe mass presumably bronchogenic carcinoma. PET-CT 02/28/2016. No interval intervention per patient request. EXAM: CT CHEST WITH CONTRAST TECHNIQUE: Multidetector CT imaging of the chest was performed during intravenous contrast administration. CONTRAST:  106mL ISOVUE-300 IOPAMIDOL (ISOVUE-300) INJECTION 61% COMPARISON:  PET-CT 02/28/2016 FINDINGS: Cardiovascular: Coronary artery calcification and aortic atherosclerotic calcification. Mediastinum/Nodes: No axillary or supraclavicular adenopathy. Interval enlargement AP window node measuring 22 mm increased from 12 mm. Lungs/Pleura: LEFT upper lobe suprahilar mass is increased in size measuring 9.3 x 6.3 cm increased from 5.6 x 5.7 cm. The mass surrounds the LEFT upper lobe bronchus with collapse of the LEFT upper lobe. Difficult to separate postobstructive collapse versus tumor. Favor majority of the consolidation in the LEFT upper lobe to represent tumor. No contralateral pulmonary  metastasis.  Emphysema the upper lobes. Upper Abdomen: New large enhancing mass lesion in the spleen measuring 7 cm x 7.7 cm New metastatic adenopathy adjacent to the pancreatic tail measuring 2.5 cm (image 145, series 2). Additional smaller nodal metastasis in the gastrosplenic ligament Cysts within the LEFT hepatic lobe is stable. Other small hypodensities unchanged. No adrenal mass identified. New lesion along the distal esophagus measuring 12 mm by 16 mm (image 117, series 2) is favored a metastatic lymph node partially compressing the esophagus Musculoskeletal: No aggressive osseous lesion. IMPRESSION: 1. 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 2. Marked increase in size of AP window metastatic lymph node. 3. New large 7 cm metastatic lesion to the spleen. 4. New metastatic adenopathy along the pancreatic tail, upper abdomen, and middle mediastinal adjacent to the distal esophagus. These results will be called to the ordering clinician or representative by the Radiologist Assistant, and communication documented in the PACS or zVision Dashboard.  Electronically Signed   By: Suzy Bouchard M.D.   On: 10/28/2016 13:43   Nm Pulmonary Perf And Vent  Result Date: 10/29/2016 CLINICAL DATA:  Evaluate for pulmonary embolus. Shortness of breath. COPD. Left lung mass. EXAM: NUCLEAR MEDICINE VENTILATION - PERFUSION LUNG SCAN TECHNIQUE: Ventilation images were obtained in multiple projections using inhaled aerosol Tc-46m DTPA. Perfusion images were obtained in multiple projections after intravenous injection of Tc-39m MAA. RADIOPHARMACEUTICALS:  29.0 mCi Technetium-64m DTPA aerosol inhalation and 4.3 mCi Technetium-1m MAA IV COMPARISON:  CT 10/28/2016. Chest x-ray 10/26/2016. ET-CT 02/06/2016. FINDINGS: Patchy  matched ventilation and perfusion defects are noted throughout both lungs with particularly poor ventilation perfusion of the left lung. Defect noted in the left upper lung consistent with known mass. This scan is indeterminate for pulmonary embolus. IMPRESSION: Very poor ventilation perfusion throughout both lungs, particularly the left, with patchy multiple matched defects. Indeterminate scan for pulmonary embolus . Electronically Signed   By: Ray   On: 10/29/2016 15:29    CT and PET images were reviewed.   Assessment and Plan:   1. Left lung mass  PET scan 02/28/2016-hypermetabolic left upper lobe mass, hypermetabolic adjacent nodule, hypermetabolic AP window node  CT chest 10/28/2016-enlarging left upper lobe mass, increased AP window lymphadenopathy, new spleen metastasis, new adenopathy at the pancreas tail, upper abdomen, and middle mediastinum  2. Acute respiratory failure secondary to #1  3. Oxygen dependent COPD  4. History of a NSTEMI 2015   Brooke [name] presents with respiratory failure in the setting of a left lung mass with left lung obstruction. The chest CT is consistent with metastatic disease involving lymph nodes and the spleen.  Her activity is very limited by the obstructing left lung mass. I discussed the high likelihood of metastatic lung cancer with Brooke [name], her former husband, and a friend. She most likely has non-small cell lung cancer, but small cell carcinoma is also possible. We discussed various palliative treatment options. There is a small likelihood she could have a mutation that would make her eligible for a targeted therapy. We also discussed immunotherapy.  Brooke [name] would like to proceed with a diagnostic bronchoscopy in order to establish a diagnosis. The goal of treatment would be to improve her quality of life and potentially extend survival.  I will continue following her in the hospital and recommend treatment when there is a tissue  diagnosis. I will schedule outpatient follow-up at the [facility].    Donneta Romberg, MD 10/30/2016, 3:50 PM

## 2016-10-30 NOTE — Progress Notes (Signed)
PROGRESS NOTE  Brooke [name]  GYI:[id number] DOB: 1945/08/24 DOA: 10/22/2016 PCP: [name], MD  Outpatient Specialists: [facility] Brief Narrative: Brooke [name] is a 71 y.o. female with a history of O2-dependent COPD, h/o tobacco use until 2009, and LUL mass without tissue diagnosis who presented 6/18 with respiratory distress found to have left-sided postobstructive pneumonia. She has improved mildly with antibiotics and steroids but remains significantly more dyspneic and weak than baseline. Pulmonology was consulted, CT chest w/contrast demonstrated advancement of local LUL and new metastatic disease. The patient was reluctant to undergo bronchoscopy for biopsy. Oncology was consulted to inform decision whether a tissue diagnosis would provide actionable information, and the patient would like to proceed with biopsy with the hope for definitive diagnosis and targeted palliative treatment. Her symptom burden remains significant, and palliative care has been consulted.   Assessment & Plan: Principal Problem:   Community acquired pneumonia of left upper lobe of lung ([place]) Active Problems:   COPD  GOLD IV    Mass of upper lobe of left lung   Chronic respiratory failure with hypoxia (HCC)   COPD exacerbation (HCC)   Hypokalemia   Microcytic anemia   SOB (shortness of breath)  Acute on chronic hypoxemic respiratory failure: Due to large lung mass, pneumonia on COPD, 2L home O2-dependent since 2014. s/p Tx of PNA and COPD exacerbation with little improvement. V/Q scan intermediate with multiple defects, though these were matched.  - Continue supplemental oxygen and treatment of conditions as below - Pulmonology, consulted, appreciate recommendations.   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 Due to high risk for complications, the patient had declined further evaluation/biopsy. CT chest 10/28/16 shows significant  enlargement of LUL mass now constricting LUL bronchus in addition to metastatic LN, large splenic metastasis, and metastatic lymphadenopathy involving the pancreatic tail, upper abdomen, and paraesophageal mediastinum. Normal chest fluoroscopy. - Oncology consult appreciated.  - Pt amenable to proceed with bronchoscopy and biopsy.  - Palliative care consulted for symptom management and assistance parsing goals of care going forward.  Left upper lobe pneumonia: Possibly post-obstructive behind LUL mass noted in 2017.  - PCT reassuring, s/p 7 days abx. - Acute debility/weakness has made her high risk for complications, falls with return to home, so plan is to discharge to SNF for rehabilitation per PT recommendations.   GOLD IV COPD with acute exacerbation: Spirometry 02/15/2016: FEV1 0.60 (28%). Ratio 38. Exacerbation not thought to be primary cause of acute decompensation, though is causing diminished pulmonary reserve.  - Continue bronchodilators, mucolytics - Completed steroids 6/25. No wheezing.    Iron-deficiency anemia: workup reveals low iron saturation. - Iron supplement ordered - Defer further evaluation to outpatient setting in absence of active gross bleeding.   DVT prophylaxis: Lovenox Code Status: BiPAP is ok, otherwise DNR/DNI Family Communication: None at bedside this AM. Disposition Plan: DC to SNF pending bronchoscopy. Significant symptom burden may make hospice eligible.   Consultants:   Pulmonology, Dr. Melvyn Novas > Dr. Lake Bells  Oncology, [name]  Palliative care team  Procedures:   None  Antimicrobials:  Ceftriaxone 6/18 >> 6/24  Azithromycin 6/18 >>  6/24  Subjective: Dyspnea unchanged, limiting ambulation to 2 steps to [facility], occasionally needs assistance just for that. Reports her SpO2 is dropping into 60's with exertion.  Objective: Vitals:   10/30/16 0240 10/30/16 0622 10/30/16 0819 10/30/16 1405  BP:  (!) 84/73  115/75  Pulse:  83  88  Resp:  18   18  Temp:  98.3 F (36.8 C)  98.1 F (36.7 C)  TempSrc:  Oral  Oral  SpO2: 93% 100% 96% 96%  Weight:      Height:        Intake/Output Summary (Last 24 hours) at 10/30/16 1409 Last data filed at 10/30/16 1124  Gross per 24 hour  Intake              640 ml  Output              600 ml  Net               40 ml   Filed Weights   10/22/16 2129  Weight: 53.5 kg (118 lb)    Examination: General exam: Frail female in no distress laying on right side in bed. Respiratory system: Tachypneic with accessory muscle use with even mild exertion despite oxygen by [place]. Has to pause during conversation to catch breath. No wheezing or crackles. Decreased aeration throughout, worse on left. Dullness to percussion on left.  Cardiovascular system: Regular rate and rhythm. No murmur, rub, or gallop. No JVD, and no pedal edema. Gastrointestinal system: Abdomen soft, non-tender, non-distended, with normoactive bowel sounds. No organomegaly or masses felt. Central nervous system: Alert and oriented. No focal neurological deficits. Extremities: Warm, +clubbing without cyanosis. Skin: No rashes, lesions no ulcers Psychiatry: Judgement and insight appear normal. Mood & affect appropriate.   Data Reviewed: I have personally reviewed following labs and imaging studies  CBC:  Recent Labs Lab 10/29/16 0439  WBC 9.5  HGB 12.0  HCT 37.4  MCV 74.9*  PLT 242   Basic Metabolic Panel:  Recent Labs Lab 10/26/16 0343 10/29/16 0439  NA 141 138  K 4.2 3.7  CL 105 104  CO2 26 25  GLUCOSE 152* 106*  BUN 11 7  CREATININE 0.41* 0.35*  CALCIUM 9.4 9.0   GFR: Estimated Creatinine Clearance: 53.4 mL/min (A) (by C-G formula based on SCr of 0.35 mg/dL (L)). Liver Function Tests: No results for input(s): AST, ALT, ALKPHOS, BILITOT, PROT, ALBUMIN in the last 168 hours. No results for input(s): LIPASE, AMYLASE in the last 168 hours. No results for input(s): AMMONIA in the last 168 hours. Coagulation  Profile: No results for input(s): INR, PROTIME in the last 168 hours. Cardiac Enzymes: No results for input(s): CKTOTAL, CKMB, CKMBINDEX, TROPONINI in the last 168 hours. BNP (last 3 results) No results for input(s): PROBNP in the last 8760 hours. HbA1C: No results for input(s): HGBA1C in the last 72 hours. CBG: No results for input(s): GLUCAP in the last 168 hours. Lipid Profile: No results for input(s): CHOL, HDL, LDLCALC, TRIG, CHOLHDL, LDLDIRECT in the last 72 hours. Thyroid Function Tests: No results for input(s): TSH, T4TOTAL, FREET4, T3FREE, THYROIDAB in the last 72 hours. Anemia Panel: No results for input(s): VITAMINB12, FOLATE, FERRITIN, TIBC, IRON, RETICCTPCT in the last 72 hours. Urine analysis: No results found for: COLORURINE, APPEARANCEUR, LABSPEC, [place], GLUCOSEU, HGBUR, [place], [place], PROTEINUR, UROBILINOGEN, NITRITE, LEUKOCYTESUR Recent Results (from the past 240 hour(s))  Culture, sputum-assessment     Status: None   Collection Time: 10/22/16 11:55 PM  Result Value Ref Range Status   Specimen Description SPUTUM  Final   Special Requests NONE  Final   Sputum evaluation THIS SPECIMEN IS ACCEPTABLE FOR SPUTUM CULTURE  Final   Report Status 10/23/2016 FINAL  Final  Culture, respiratory (NON-Expectorated)     Status: None   Collection Time: 10/22/16 11:55  PM  Result Value Ref Range Status   Specimen Description SPUTUM  Final   Special Requests NONE Reflexed from [id number]  Final   Gram Stain   Final    ABUNDANT WBC PRESENT, PREDOMINANTLY PMN RARE SQUAMOUS EPITHELIAL CELLS PRESENT FEW GRAM POSITIVE COCCI IN PAIRS FEW GRAM NEGATIVE COCCOBACILLI RARE GRAM POSITIVE RODS    Culture   Final    Consistent with normal respiratory flora. Performed at [facility], [place] [address], [place], [place] [address]    Report Status 10/25/2016 FINAL  Final  Respiratory Panel by PCR     Status: None   Collection Time: 10/23/16  1:49 PM  Result Value Ref Range  Status   Adenovirus NOT DETECTED NOT DETECTED Final   Coronavirus 229E NOT DETECTED NOT DETECTED Final   Coronavirus HKU1 NOT DETECTED NOT DETECTED Final   Coronavirus NL63 NOT DETECTED NOT DETECTED Final   Coronavirus OC43 NOT DETECTED NOT DETECTED Final   Metapneumovirus NOT DETECTED NOT DETECTED Final   Rhinovirus / Enterovirus NOT DETECTED NOT DETECTED Final   Influenza A NOT DETECTED NOT DETECTED Final   Influenza B NOT DETECTED NOT DETECTED Final   Parainfluenza Virus 1 NOT DETECTED NOT DETECTED Final   Parainfluenza Virus 2 NOT DETECTED NOT DETECTED Final   Parainfluenza Virus 3 NOT DETECTED NOT DETECTED Final   Parainfluenza Virus 4 NOT DETECTED NOT DETECTED Final   Respiratory Syncytial Virus NOT DETECTED NOT DETECTED Final   Bordetella pertussis NOT DETECTED NOT DETECTED Final   Chlamydophila pneumoniae NOT DETECTED NOT DETECTED Final   Mycoplasma pneumoniae NOT DETECTED NOT DETECTED Final    Comment: Performed at [facility], [place] [address], [place], [place] [address]      Radiology Studies: Dg Chest 2 View  Result Date: 10/29/2016 CLINICAL DATA:  Shortness of breath EXAM: CHEST  2 VIEW COMPARISON:  CT chest 10/28/2016 FINDINGS: Large left upper lobe pulmonary mass. No other pulmonary masses identified. No pleural effusion or pneumothorax. Stable heart size. No acute osseous abnormality. IMPRESSION: Large left upper lobe pulmonary mass most consistent with primary lung malignancy. Electronically Signed   By: Kathreen Devoid   On: 10/29/2016 15:40   Dg Chest Fluoro  Result Date: 10/29/2016 CLINICAL DATA:  Large left upper lobe mass with mediastinal involvement. Clinical concern for phrenic nerve dysfunction. EXAM: CHEST FLUOROSCOPY TECHNIQUE: Real-time fluoroscopic evaluation of the chest was performed. FLUOROSCOPY TIME:  Fluoroscopy Time:  0 minutes and 12 seconds. Radiation Exposure Index (if provided by the fluoroscopic device): 10 mGy Number of Acquired Spot Images: 0  COMPARISON:  None. FINDINGS: With a sniff maneuver, there is appropriate and expected bilateral hemi diaphragmatic excursion. No evidence for paradoxical hemi diaphragmatic motion. IMPRESSION: Normal motion of each hemidiaphragm with sniff maneuver. Electronically Signed   By: Misty Stanley M.D.   On: 10/29/2016 16:41   Nm Pulmonary Perf And Vent  Result Date: 10/29/2016 CLINICAL DATA:  Evaluate for pulmonary embolus. Shortness of breath. COPD. Left lung mass. EXAM: NUCLEAR MEDICINE VENTILATION - PERFUSION LUNG SCAN TECHNIQUE: Ventilation images were obtained in multiple projections using inhaled aerosol Tc-97m DTPA. Perfusion images were obtained in multiple projections after intravenous injection of Tc-27m MAA. RADIOPHARMACEUTICALS:  29.0 mCi Technetium-44m DTPA aerosol inhalation and 4.3 mCi Technetium-86m MAA IV COMPARISON:  CT 10/28/2016. Chest x-ray 10/26/2016. ET-CT 02/06/2016. FINDINGS: Patchy matched ventilation and perfusion defects are noted throughout both lungs with particularly poor ventilation perfusion of the left lung. Defect noted in the left upper lung  consistent with known mass. This scan is indeterminate for pulmonary embolus. IMPRESSION: Very poor ventilation perfusion throughout both lungs, particularly the left, with patchy multiple matched defects. Indeterminate scan for pulmonary embolus . Electronically Signed   By: Marcello Moores  Register   On: 10/29/2016 15:29    Scheduled Meds: . amLODipine  2.5 mg Oral Daily  . atorvastatin  20 mg Oral QHS  . enoxaparin (LOVENOX) injection  40 mg Subcutaneous Q24H  . feeding supplement (ENSURE ENLIVE)  237 mL Oral TID BM  . ferrous sulfate  325 mg Oral BID WC  . guaiFENesin  1,200 mg Oral BID  . ipratropium-albuterol  3 mL Nebulization Q6H  . mometasone-formoterol  2 puff Inhalation BID   Continuous Infusions: . 0.9 % NaCl with KCl 20 mEq / L 1,000 mL (10/30/16 0322)     LOS: 8 days   Time spent: 25 minutes.  Vance Gather, MD [facility] Pager [phone]  If 7PM-7AM, please contact night-coverage [email or web address] Password [email or web address] 10/30/2016, 2:09 PM

## 2016-10-30 NOTE — Progress Notes (Signed)
Occupational Therapy Treatment Patient Details Name: Brooke [name] MRN: [id number] DOB: [birth year] Today's Date: 10/30/2016    History of present illness Pt was admitted for CAP.  H/O COPD   OT comments  Continued with energy conservation and working on ADLs within tolerance  Follow Up Recommendations  SNF (pt is considering hospice)    Equipment Recommendations  3 in 1 bedside commode (if pt doesn't have)    Recommendations for Other Services      Precautions / Restrictions Precautions Precautions: Fall Restrictions Weight Bearing Restrictions: No       Mobility Bed Mobility Overal bed mobility: Modified Independent                Transfers                      Balance                                           ADL either performed or assessed with clinical judgement   ADL                                         General ADL Comments: pt shared that she is considering hospice and palliative route.  Educated on energy conservation and encouraged small bursts of activity. Handout provided.  Pt sat up and performed some UB bathing. She demonstrates pacing herself and pursed lip breathing     Vision       Perception     Praxis      Cognition Arousal/Alertness: Awake/alert Behavior During Therapy: WFL for tasks assessed/performed Overall Cognitive Status: Within Functional Limits for tasks assessed                                          Exercises     Shoulder Instructions       General Comments      Pertinent Vitals/ Pain       Pain Assessment: No/denies pain  Home Living                                          Prior Functioning/Environment              Frequency  Min 2X/week        Progress Toward Goals  OT Goals(current goals can now be found in the care plan section)  Progress towards OT goals: Progressing toward goals  Acute Rehab OT  Goals Patient Stated Goal: get back to being independent Time For Goal Achievement: 11/02/16  Plan      Co-evaluation                 AM-PAC PT "6 Clicks" Daily Activity     Outcome Measure   Help from another person eating meals?: None Help from another person taking care of personal grooming?: A Little Help from another person toileting, which includes using toliet, bedpan, or urinal?: A Little Help from another person bathing (including washing, rinsing, drying)?: A Little Help from another person to put on and taking  off regular upper body clothing?: A Little Help from another person to put on and taking off regular lower body clothing?: A Little 6 Click Score: 19    End of Session    OT Visit Diagnosis: Muscle weakness (generalized) (M62.81)   Activity Tolerance Patient tolerated treatment well   Patient Left in bed;with call bell/phone within reach   Nurse Communication          Time: 8828-0034 OT Time Calculation (min): 17 min  Charges: OT General Charges $OT Visit: 1 Procedure OT Treatments $Self Care/Home Management : 8-22 mins  Lesle Chris, OTR/L [phone] 10/30/2016   [place] 10/30/2016, 3:27 PM

## 2016-10-30 NOTE — Progress Notes (Signed)
[facility]  S: slept OK, still dyspneic requiring q4h albuterol which helps some  O:. Vitals:   10/29/16 2139 10/30/16 0240 10/30/16 0622 10/30/16 0819  BP: 123/69  (!) 84/73   Pulse: 94  83   Resp: 18  18   Temp: 98 F (36.7 C)  98.3 F (36.8 C)   TempSrc: Oral  Oral   SpO2: 94% 93% 100% 96%  Weight:      Height:       2LNC  General:  Resting comfortably in bed HENT: NCAT OP clear PULM: poor air movement on left B, normal effort CV: RRR, no mgr GI: BS+, soft, nontender MSK: normal bulk and tone Neuro: awake, alert, no distress, MAEW   CT chest images reviewed: emphysema bilaterally, left upper lobe mass, left sided mediastinal adenopathy Diaphragm fluoro: normal V/Q scan: poor ventilation images, no clear PE  CBC    Component Value Date/Time   WBC 9.5 10/29/2016 0439   RBC 4.99 10/29/2016 0439   HGB 12.0 10/29/2016 0439   HCT 37.4 10/29/2016 0439   PLT 234 10/29/2016 0439   MCV 74.9 (L) 10/29/2016 0439   MCH 24.0 (L) 10/29/2016 0439   MCHC 32.1 10/29/2016 0439   RDW 16.3 (H) 10/29/2016 0439   LYMPHSABS 0.6 (L) 10/22/2016 1739   MONOABS 0.4 10/22/2016 1739   EOSABS 0.0 10/22/2016 1739   BASOSABS 0.0 10/22/2016 1739   Impression/plan: Acute respiratory failure with hypoxemia> due to centrilobular emphysema, perhaps mild exacerbation of COPD and lung mass; no other clear confounders.  She is still reluctant to consider a bronchoscopy.  I have explained many times that the risk is low.  Should she choose to forgo bronch then she needs hospice.  She would like to speak to oncology first to help her decide if she might consider treatment.    Will see her again once she has made up her mind  Centrilobular emphysema: change back to dulera per her request, stop nebulized controller medicines  > 50% of this 35 minute visit spent face to face trying to convince her to have a bronchoscopy  Roselie Awkward, MD [facility] Pager: [phone] Cell: [phone] After 3pm or  if no response, call [phone]

## 2016-10-31 LAB — BASIC METABOLIC PANEL
Anion gap: 7 (ref 5–15)
BUN: 14 mg/dL (ref 6–20)
CALCIUM: 8.5 mg/dL — AB (ref 8.9–10.3)
CO2: 26 mmol/L (ref 22–32)
CREATININE: 0.45 mg/dL (ref 0.44–1.00)
Chloride: 104 mmol/L (ref 101–111)
GFR calc Af Amer: >60 mL/min (ref >60)
Glucose, Bld: 97 mg/dL (ref 65–99)
Potassium: 4 mmol/L (ref 3.5–5.1)
Sodium: 137 mmol/L (ref 135–145)

## 2016-10-31 LAB — CBC
HEMATOCRIT: 36.3 % (ref 36.0–46.0)
Hemoglobin: 11.5 g/dL — ABNORMAL LOW (ref 12.0–15.0)
MCH: 23.9 pg — AB (ref 26.0–34.0)
MCHC: 31.7 g/dL (ref 30.0–36.0)
MCV: 75.3 fL — AB (ref 78.0–100.0)
PLATELETS: 236 10*3/uL (ref 150–400)
RBC: 4.82 MIL/uL (ref 3.87–5.11)
RDW: 16.4 % — AB (ref 11.5–15.5)
WBC: 9.3 10*3/uL (ref 4.0–10.5)

## 2016-10-31 MED ORDER — ALUM & MAG HYDROXIDE-SIMETH 200-200-20 MG/5ML PO SUSP
30.0000 mL | ORAL | Status: DC | PRN
  Administered 2016-10-31 – 2016-11-02 (×4): 30 mL via ORAL
  Filled 2016-10-31 (×4): qty 30

## 2016-10-31 NOTE — Progress Notes (Signed)
PROGRESS NOTE  Brooke [name]  XFG:[id number] DOB: [birth year] DOA: 10/22/2016 PCP: [name], MD  Outpatient Specialists: [facility] Brief Narrative: Brooke [name] is a 71 y.o. female with a history of O2-dependent COPD, h/o tobacco use until 2009, and LUL mass without tissue diagnosis who presented 6/18 with respiratory distress found to have left-sided postobstructive pneumonia. She has improved mildly with antibiotics and steroids but remains significantly more dyspneic and weak than baseline. Pulmonology was consulted, CT chest w/contrast demonstrated advancement of local LUL and new metastatic disease. The patient was reluctant to undergo bronchoscopy for biopsy. Oncology was consulted to inform decision whether a tissue diagnosis would provide actionable information, and the patient would like to proceed with biopsy with the hope for definitive diagnosis and targeted palliative treatment. Her symptom burden remains significant, and palliative care has been consulted. Plan tentatively is for CT-guided biopsy 6/28.   Assessment & Plan: Principal Problem:   Community acquired pneumonia of left upper lobe of lung ([place]) Active Problems:   COPD  GOLD IV    Mass of upper lobe of left lung   Chronic respiratory failure with hypoxia (HCC)   COPD exacerbation (HCC)   Hypokalemia   Microcytic anemia   SOB (shortness of breath)  Acute on chronic hypoxemic respiratory failure: Due to large lung mass, pneumonia on COPD, 2L home O2-dependent since 2014. s/p Tx of PNA and COPD exacerbation with little improvement. V/Q scan intermediate with multiple defects, though these were matched.  - Continue supplemental oxygen and treatment of conditions as below - Pulmonology, consulted, appreciate recommendations.   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 Due to high risk for complications, the patient had declined further  evaluation/biopsy. CT chest 10/28/16 shows significant enlargement of LUL mass now constricting LUL bronchus in addition to metastatic LN, large splenic metastasis, and metastatic lymphadenopathy involving the pancreatic tail, upper abdomen, and paraesophageal mediastinum. Normal chest fluoroscopy. - Oncology consult appreciated.  - Pt amenable to proceed with biopsy, pulm recommending lower risk CT-guided biopsy. IR has discussed risks/benefits. Planning tmrw.  - Appreciate palliative care assistance. Plan for DC to SNF with palliative following. Remains partial code, but does want procedure for biopsy.   Left upper lobe pneumonia: Possibly post-obstructive behind LUL mass noted in 2017.  - PCT reassuring, s/p 7 days abx. - Acute debility/weakness has made her high risk for complications, falls with return to home, so plan is to discharge to SNF for rehabilitation per PT recommendations.   GOLD IV COPD with acute exacerbation: Spirometry 02/15/2016: FEV1 0.60 (28%). Ratio 38. Exacerbation not thought to be primary cause of acute decompensation, though is causing diminished pulmonary reserve.  - Continue bronchodilators, mucolytics - Completed steroids 6/25. No wheezing.    Iron-deficiency anemia: Workup reveals low iron saturation. - Iron supplement ordered - Defer further evaluation to outpatient setting in absence of active gross bleeding.   DVT prophylaxis: Lovenox Code Status: BiPAP is ok, otherwise DNR/DNI Family Communication: None at bedside this AM. Disposition Plan: DC to SNF with palliative pending biopsy.   Consultants:   Pulmonology, Dr. Melvyn Novas > Dr. Lake Bells  Oncology, [name]  Palliative care team, Dr. Rowe Pavy  IR  Procedures:   None  Antimicrobials:  Ceftriaxone 6/18 >> 6/24  Azithromycin 6/18 >>  6/24  Subjective: Dyspnea stable, not at premorbid baseline. Again reports SpO2 drops into 60's to 70's with exertion (2 steps).   Objective: Vitals:    10/31/16 0221 10/31/16 9678  10/31/16 0751 10/31/16 1423  BP:  110/76  121/76  Pulse:  84  80  Resp:  18  18  Temp:  98.4 F (36.9 C)  98.4 F (36.9 C)  TempSrc:  Tympanic  Oral  SpO2: 97% 97% 90% 95%  Weight:      Height:        Intake/Output Summary (Last 24 hours) at 10/31/16 1706 Last data filed at 10/30/16 1859  Gross per 24 hour  Intake              240 ml  Output                0 ml  Net              240 ml   Filed Weights   10/22/16 2129  Weight: 53.5 kg (118 lb)    Examination: General exam: Frail female in no distress laying in bed Respiratory system: Pauses during speaking to catch breath despite oxygen by nasal cannula. No wheezing or crackles. Decreased aeration throughout, worse on left. Dullness to percussion on left.  Cardiovascular system: Regular rate and rhythm. No murmur, rub, or gallop. No JVD, and no pedal edema. Gastrointestinal system: Abdomen soft, non-tender, non-distended, with normoactive bowel sounds. No organomegaly or masses felt. Central nervous system: Alert and oriented. No focal neurological deficits. Extremities: Warm, + clubbing without cyanosis. Skin: No rashes, lesions no ulcers Psychiatry: Judgement and insight appear normal. Mood & affect appropriate.   CBC:  Recent Labs Lab 10/29/16 0439 10/31/16 0326  WBC 9.5 9.3  HGB 12.0 11.5*  HCT 37.4 36.3  MCV 74.9* 75.3*  PLT 234 258   Basic Metabolic Panel:  Recent Labs Lab 10/26/16 0343 10/29/16 0439 10/31/16 0326  NA 141 138 137  K 4.2 3.7 4.0  CL 105 104 104  CO2 26 25 26   GLUCOSE 152* 106* 97  BUN 11 7 14   CREATININE 0.41* 0.35* 0.45  CALCIUM 9.4 9.0 8.5*   GFR: Estimated Creatinine Clearance: 53.4 mL/min (by C-G formula based on SCr of 0.45 mg/dL).   Recent Results (from the past 240 hour(s))  Culture, sputum-assessment     Status: None   Collection Time: 10/22/16 11:55 PM  Result Value Ref Range Status   Specimen Description SPUTUM  Final   Special Requests  NONE  Final   Sputum evaluation THIS SPECIMEN IS ACCEPTABLE FOR SPUTUM CULTURE  Final   Report Status 10/23/2016 FINAL  Final  Culture, respiratory (NON-Expectorated)     Status: None   Collection Time: 10/22/16 11:55 PM  Result Value Ref Range Status   Specimen Description SPUTUM  Final   Special Requests NONE Reflexed from [id number]  Final   Gram Stain   Final    ABUNDANT WBC PRESENT, PREDOMINANTLY PMN RARE SQUAMOUS EPITHELIAL CELLS PRESENT FEW GRAM POSITIVE COCCI IN PAIRS FEW GRAM NEGATIVE COCCOBACILLI RARE GRAM POSITIVE RODS    Culture   Final    Consistent with normal respiratory flora. Performed at [facility], [place] [address] [place], [place] [address]    Report Status 10/25/2016 FINAL  Final  Respiratory Panel by PCR     Status: None   Collection Time: 10/23/16  1:49 PM  Result Value Ref Range Status   Adenovirus NOT DETECTED NOT DETECTED Final   Coronavirus 229E NOT DETECTED NOT DETECTED Final   Coronavirus HKU1 NOT DETECTED NOT DETECTED Final   Coronavirus NL63 NOT DETECTED NOT DETECTED Final   Coronavirus OC43 NOT  DETECTED NOT DETECTED Final   Metapneumovirus NOT DETECTED NOT DETECTED Final   Rhinovirus / Enterovirus NOT DETECTED NOT DETECTED Final   Influenza A NOT DETECTED NOT DETECTED Final   Influenza B NOT DETECTED NOT DETECTED Final   Parainfluenza Virus 1 NOT DETECTED NOT DETECTED Final   Parainfluenza Virus 2 NOT DETECTED NOT DETECTED Final   Parainfluenza Virus 3 NOT DETECTED NOT DETECTED Final   Parainfluenza Virus 4 NOT DETECTED NOT DETECTED Final   Respiratory Syncytial Virus NOT DETECTED NOT DETECTED Final   Bordetella pertussis NOT DETECTED NOT DETECTED Final   Chlamydophila pneumoniae NOT DETECTED NOT DETECTED Final   Mycoplasma pneumoniae NOT DETECTED NOT DETECTED Final    Comment: Performed at [facility], [place] [address] [place],  [address]      Radiology Studies: No results found.  Scheduled Meds: . amLODipine  2.5 mg Oral  Daily  . atorvastatin  20 mg Oral QHS  . feeding supplement (ENSURE ENLIVE)  237 mL Oral TID BM  . ferrous sulfate  325 mg Oral BID WC  . guaiFENesin  1,200 mg Oral BID  . ipratropium-albuterol  3 mL Nebulization Q6H  . mometasone-formoterol  2 puff Inhalation BID   Continuous Infusions: . 0.9 % NaCl with KCl 20 mEq / L 75 mL/hr at 10/31/16 0625     LOS: 9 days   Time spent: 25 minutes.  Vance Gather, MD [facility] Pager [phone]  If 7PM-7AM, please contact night-coverage [email or web address] Password [facility] 10/31/2016, 5:06 PM

## 2016-10-31 NOTE — Progress Notes (Signed)
CSW following to assist with disposition/DC planning.  Spoke with pt for update re: her plan/expectations for DC. States she is still considering SNF but unsure re: plan to go for rehab v. pursuing hospice care. States she is awaiting biopsy and results to guide her treatment options prior to being able to make that decision. CSW updated BC[employer] Medicare (had SNF authorization through today- will resubmit request if appropriate once DC plan known) and updated SNF pt had chosen [facility]).   Will continue following.  Sharren Bridge, MSW, LCSW Clinical Social Work 10/31/2016 [phone]

## 2016-10-31 NOTE — Consult Note (Signed)
Consultation Note Date: 10/31/2016   Patient Name: Brooke [name]  DOB: 13-Sep-1945  MRN: [id number]  Age / Sex: 71 y.o., female  PCP: [name], MD Referring Physician: Patrecia Pour, MD  Reason for Consultation: Establishing goals of care  HPI/Patient Profile: 71 y.o. female  with past medical history of oxygen dependent COPD, recently admitted with post obstructive pneumonia now admitted on 10/22/2016    Clinical Assessment and Goals of Care:  Brooke [name] is a 71 year old lady with a past medical history significant for oxygen dependent COPD. She was admitted to the hospital on 6-18 with shortness of breath she was found to have consolidation in left upper lung. In October 2017 she was found to have a left lung mass on CT and PET scan. She was noted to have ipsilateral hypermetabolic mediastinal lymph node. She was referred for biopsy but had declined. Patient is now admitted to the hospitalist service with postobstructive pneumonia, treated with antibiotics, seen by pulmonology, CT scan on 10/28/2016 of the chest shows progression of left upper lobe mass with constriction of the left bronchus Large 7 cm metastatic lesion in the spleen, new metastatic adenopathy at the tail of the pancreas upper abdomen adjacent to these distal esophagus.  Patient has been seen by pulmonology as well as medical oncology. Plans are underway for biopsy of left lung mass. Over arching concern being patient having metastatic disease involving lymph nodes and spleen in the setting of left lung mass, left lung obstruction. Palliative medicine has been consulted for symptom management and goals of care discussions.  Brooke [name] is a pleasant lady resting in bed. She rested well overnight. She states that her supplemental oxygen has been increased from 2-3 L, she did receive Dulera and Symbicort last night. She thinks that helped  her rest. She has some cough and some sputum but states it's not nearly as frothy or as volume in assess before. She complains of poor appetite. She is able to sip on some ensure but otherwise has declined oral intake. She complains about how rapidly her functional status has declined. She used to carry portable oxygen and be independent of all her activities of daily living. Now she states she is having a hard time getting out of the bed and using the bedside commode.  Brief life review performed. Brooke. Mattera lives alone in [place], [place]. She does not have any children. Her ex-husband lives nearby. Recently she completed paperwork designating her ex-husband to be her healthcare power of attorney agent. She'll also completed a living will and other advanced directives. She is realistic about her overall condition. She states that she has COPD, did not expect to live forever but did not think she would decline this rapidly. She is appreciative of information she has received from medical oncology and is eager to look forward to token of treatments can be offered to her to improve both her quality and quantity of life. She worked in Emergency planning/management officer. She even talked at [name].  She has several friends in the area. She wishes to participate with physical therapy later on.  We had extensive discussions about how palliative care can help, her overall condition, her goals and wishes and values. Upon her request, I also gave her brief information about hospice services, particularly as a source of support for her ex-husband in the future going forward.  See recommendations below. Thank you for the consult.  HCPOA  ex husband Lina Hitch is her designated Psychiatrist. She has completed paperwork a few months ago at her attorney's office in [place]    1. Brooke [name] is in agreement for safest, least invasive possible biopsy plans for  diagnosis. She is appreciative of the information she has received from Dr Lake Bells and Dr Benay Spice.  2. Patient does not want CPR, does not want intubation and mechanical ventilation at end of life, she is agreeable to supplemental 02, bipap etc to keep her comfortable.  3. Continue current efforts at diagnosis and treatment options. We will continue to follow along, thank you for the consult.  4. Will consider low dose oral morphine solution if she has high symptom burden, patient states that her dyspnea and cough have improved since her dulera and symbicort have been reinstated.   Code Status/Advance Care Planning:  Limited code    Symptom Management:    as above   Palliative Prophylaxis:   Delirium Protocol   Psycho-social/Spiritual:   Desire for further Chaplaincy support:no  Additional Recommendations: Education on Hospice  Prognosis:   Unable to determine  Discharge Planning: To Be Determined      Primary Diagnoses: Present on Admission: . Community acquired pneumonia of left upper lobe of lung (Brooke [name]) . Chronic respiratory failure with hypoxia (Brooke [name]) . COPD  GOLD IV  . Hypokalemia . Microcytic anemia . Mass of upper lobe of left lung   I have reviewed the medical record, interviewed the patient and family, and examined the patient. The following aspects are pertinent.  Past Medical History:  Diagnosis Date  . Chronic respiratory failure (HCC)    a. on home O2.  Brooke [name] COPD (chronic obstructive pulmonary disease) (Brooke [name])    a. Home O2.  . Former tobacco use   . GERD (gastroesophageal reflux disease)   . Hyperlipidemia   . NSTEMI (non-ST elevated myocardial infarction) (Brooke [name])    a. 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 Coronary vasospasm   Social History   Social History  . Marital status: Divorced    Spouse name: N/A  . Number of children: N/A  . Years of education: N/A   Social History Main  Topics  . Smoking status: Former Smoker    Packs/day: 1.00    Years: 30.00    Types: Cigarettes    Quit date: 05/08/2007  . Smokeless tobacco: Never Used  . Alcohol use Yes     Comment: rarely  . Drug use: No  . Sexual activity: Not Asked   Other Topics Concern  . None   Social History Narrative  . None   Family History  Problem Relation Age of Onset  . Lung cancer Father   . Heart attack Neg Hx   . Stroke Neg Hx   . Hypertension Neg Hx    Scheduled Meds: . amLODipine  2.5 mg Oral Daily  . atorvastatin  20 mg Oral QHS  . enoxaparin (LOVENOX) injection  40 mg Subcutaneous Q24H  .  feeding supplement (ENSURE ENLIVE)  237 mL Oral TID BM  . ferrous sulfate  325 mg Oral BID WC  . guaiFENesin  1,200 mg Oral BID  . ipratropium-albuterol  3 mL Nebulization Q6H  . mometasone-formoterol  2 puff Inhalation BID   Continuous Infusions: . 0.9 % NaCl with KCl 20 mEq / L 75 mL/hr at 10/31/16 0625   PRN Meds:.acetaminophen **OR** acetaminophen, albuterol, ALPRAZolam, alum & mag hydroxide-simeth, ondansetron **OR** ondansetron (ZOFRAN) IV, technetium TC 20M diethylenetriame-pentaacetic acid Medications Prior to Admission:  Prior to Admission medications   Medication Sig Start Date End Date Taking? Authorizing Provider  albuterol (PROVENTIL HFA;VENTOLIN HFA) 108 (90 BASE) MCG/ACT inhaler Inhale 2 puffs into the lungs every 6 (six) hours as needed. For wheezing/shortness of breath   Yes [provider]  amLODipine (NORVASC) 2.5 MG tablet Take 1 tablet (2.5 mg total) by mouth daily. 09/28/16  Yes [name], PA  atorvastatin (LIPITOR) 20 MG tablet Take 1 tablet (20 mg total) by mouth daily. 09/28/16  Yes [name], PA  budesonide-formoterol (SYMBICORT) 160-4.5 MCG/ACT inhaler Inhale 2 puffs into the lungs 2 (two) times daily.   Yes [provider]  ipratropium-albuterol (DUONEB) 0.5-2.5 (3) MG/3ML SOLN Take 3 mLs by nebulization 2 (two) times daily.   Yes [provider]   nitroGLYCERIN (NITROSTAT) 0.4 MG SL tablet ONE TABLET UNDER TONGUE AS NEEDED FOR CHEST PAIN EVERY 5 MINUTES FOR 3 DOSES 05/10/15  Yes Darlin Coco, MD   Allergies  Allergen Reactions  . Codeine Nausea And Vomiting  . Imdur [Isosorbide Dinitrate]     headache  . Prednisone Other (See Comments)    Reaction:Abnormal behavior; cannot take in pill form but CAN tolerate the injection   Review of Systems +weakness, rapid functional decline in the past few weeks +cough + shortness of breath  Physical Exam Awake alert Mood congruent Distant breath sounds S1 S2 Abdomen soft No edema Thin lady  Vital Signs: BP 110/76 (BP Location: Right Arm)   Pulse 84   Temp 98.4 F (36.9 C) (Tympanic)   Resp 18   Ht 5\' 3"  (1.6 m)   Wt 53.5 kg (118 lb)   SpO2 90%   BMI 20.90 kg/m  Pain Assessment: No/denies pain       SpO2: SpO2: 90 % O2 Device:SpO2: 90 % O2 Flow Rate: .O2 Flow Rate (L/min): 3 L/min  IO: Intake/output summary:  Intake/Output Summary (Last 24 hours) at 10/31/16 1208 Last data filed at 10/30/16 1859  Gross per 24 hour  Intake              240 ml  Output                0 ml  Net              240 ml    LBM: Last BM Date: 11/01/16 Baseline Weight: Weight: 53.5 kg (118 lb) Most recent weight: Weight: 53.5 kg (118 lb)     Palliative Assessment/Data:   Flowsheet Rows     Most Recent Value  Intake Tab  Referral Department  Hospitalist  Unit at Time of Referral  Oncology Unit  Palliative Care Primary Diagnosis  Cancer  Palliative Care Type  New Palliative care  Reason for referral  Clarify Goals of Care, Non-pain Symptom  Date first seen by Palliative Care  10/31/16  Clinical Assessment  Palliative Performance Scale Score  30%  Pain Max last 24 hours  4  Pain Min Last 24  hours  3  Dyspnea Max Last 24 Hours  6  Dyspnea Min Last 24 hours  4  Psychosocial & Spiritual Assessment  Palliative Care Outcomes  Patient/Family meeting held?  Yes  Who was at the  meeting?  patient   Palliative Care Outcomes  Clarified goals of care, Improved non-pain symptom therapy      Time In:  10  Time Out:  11.10  Time Total:  70 min  Greater than 50%  of this time was spent counseling and coordinating care related to the above assessment and plan.  Signed by: Loistine Chance, MD  [phone]  Please contact Palliative Medicine Team phone at [phone] for questions and concerns.  For individual provider: See Shea Evans

## 2016-10-31 NOTE — Progress Notes (Signed)
[facility]  Discussed case with [name].  We would like to get a tissue diagnosis.  Will ask IR to consider a transthoracic needle aspiration of the left upper lobe mass.  If no diagnosis yielded, then will proceed with EBUS.    Roselie Awkward, MD [facility] Pager: [phone] Cell: [phone] After 3pm or if no response, call [phone]

## 2016-10-31 NOTE — Progress Notes (Signed)
Patient complaining of indigestion and gas. Ordered PRN maalox from standing PRN order set and administered. Will continue to monitor.

## 2016-10-31 NOTE — Consult Note (Signed)
Chief Complaint: Patient was seen in consultation today for CT guided left lung mass biopsy Chief Complaint  Patient presents with  . Shortness of Breath    Referring Physician(s): McQuaid,D  Supervising Physician: Corrie Mckusick  Patient Status: [facility] - In-pt  History of Present Illness: Brooke [name] is a 71 y.o. female , former smoker, with oxygen-dependent COPD who was recently admitted to the hospital with progressive dyspnea. She has a known history of hypermetabolic left upper lobe lung mass dating back to 2017. At that time patient was approached regarding bronchoscopic biopsy but she declined. Latest CT of chest done on 6/24 reveals significant enlargement of the left upper lobe mass occupying the greater portion of the left upper lobe and constricting the left upper lobe bronchus. This also marked increase in size of AP window metastatic lymph node and new large 7 cm metastatic lesion to the spleen as well as new metastatic adenopathy along the pancreatic tail, upper abdomen and middle mediastinal region adjacent to the distal esophagus. Request now received from critical care for CT-guided left lung mass biopsy.   Past Medical History:  Diagnosis Date  . Chronic respiratory failure (HCC)    a. on home O2.  Marland Kitchen COPD (chronic obstructive pulmonary disease) ([place])    a. Home O2.  . Former tobacco use   . GERD (gastroesophageal reflux disease)   . Hyperlipidemia   . NSTEMI (non-ST elevated myocardial infarction) ([place])    a. 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 Coronary vasospasm    Past Surgical History:  Procedure Laterality Date  . LEFT HEART CATHETERIZATION WITH CORONARY ANGIOGRAM N/A 06/08/2013   Procedure: LEFT HEART CATHETERIZATION WITH CORONARY ANGIOGRAM;  Surgeon: Sanda Klein, MD;  Location: [facility];  Service: Cardiovascular;  Laterality: N/A;    Allergies: Codeine; Imdur [isosorbide  dinitrate]; and Prednisone  Medications: Prior to Admission medications   Medication Sig Start Date End Date Taking? Authorizing Provider  albuterol (PROVENTIL HFA;VENTOLIN HFA) 108 (90 BASE) MCG/ACT inhaler Inhale 2 puffs into the lungs every 6 (six) hours as needed. For wheezing/shortness of breath   Yes [provider]  amLODipine (NORVASC) 2.5 MG tablet Take 1 tablet (2.5 mg total) by mouth daily. 09/28/16  Yes [name], PA  atorvastatin (LIPITOR) 20 MG tablet Take 1 tablet (20 mg total) by mouth daily. 09/28/16  Yes [name], PA  budesonide-formoterol (SYMBICORT) 160-4.5 MCG/ACT inhaler Inhale 2 puffs into the lungs 2 (two) times daily.   Yes [provider]  ipratropium-albuterol (DUONEB) 0.5-2.5 (3) MG/3ML SOLN Take 3 mLs by nebulization 2 (two) times daily.   Yes [provider]  nitroGLYCERIN (NITROSTAT) 0.4 MG SL tablet ONE TABLET UNDER TONGUE AS NEEDED FOR CHEST PAIN EVERY 5 MINUTES FOR 3 DOSES 05/10/15  Yes Darlin Coco, MD     Family History  Problem Relation Age of Onset  . Lung cancer Father   . Heart attack Neg Hx   . Stroke Neg Hx   . Hypertension Neg Hx     Social History   Social History  . Marital status: Divorced    Spouse name: N/A  . Number of children: N/A  . Years of education: N/A   Social History Main Topics  . Smoking status: Former Smoker    Packs/day: 1.00    Years: 30.00    Types: Cigarettes    Quit date: 05/08/2007  . Smokeless tobacco: Never Used  .  Alcohol use Yes     Comment: rarely  . Drug use: No  . Sexual activity: Not Asked   Other Topics Concern  . None   Social History Narrative  . None      Review of Systems currently denies fever, headache, substernal chest pain, abdominal pain, nausea, vomiting ; she has had small amount of hemoptysis recently, occasional left shoulder pain and dyspnea as well as occasional cough.  Vital Signs: BP 121/76 (BP Location: Right Arm)   Pulse 80   Temp 98.4 F  (36.9 C) (Oral)   Resp 18   Ht 5\' 3"  (1.6 m)   Wt 118 lb (53.5 kg)   SpO2 95%   BMI 20.90 kg/m   Physical Exam awake, alert. Distant breath sounds bilaterally, especially on left. Heart with regular rate and rhythm. Abdomen soft, positive bowel sounds, nontender. No lower extremity edema.  Mallampati Score:     Imaging: Dg Chest 2 View  Result Date: 10/29/2016 CLINICAL DATA:  Shortness of breath EXAM: CHEST  2 VIEW COMPARISON:  CT chest 10/28/2016 FINDINGS: Large left upper lobe pulmonary mass. No other pulmonary masses identified. No pleural effusion or pneumothorax. Stable heart size. No acute osseous abnormality. IMPRESSION: Large left upper lobe pulmonary mass most consistent with primary lung malignancy. Electronically Signed   By: Kathreen Devoid   On: 10/29/2016 15:40   Dg Chest 2 View  Result Date: 10/26/2016 CLINICAL DATA:  Shortness of breath EXAM: CHEST  2 VIEW COMPARISON:  10/22/2016 FINDINGS: Collapse/ consolidation in the left upper lung persists in a similar fashion. Patient has known left upper lobe pulmonary mass and features remain compatible with postobstructive process. Right lung appears mildly hyperexpanded but clear. Nodular density projecting over the right lung base is compatible with nipple shadow. The cardiopericardial silhouette is within normal limits for size. Bones are diffusely demineralized. IMPRESSION: Stable exam. Volume loss and consolidation left upper lobe, likely post obstructive from patient's known left upper lobe lung mass. Electronically Signed   By: Misty Stanley M.D.   On: 10/26/2016 11:29   Dg Chest 2 View  Result Date: 10/22/2016 CLINICAL DATA:  Acute onset of shortness of breath and congestion. Initial encounter. EXAM: CHEST  2 VIEW COMPARISON:  Chest radiograph performed 01/19/2016, and PET/CT performed 02/28/2016 FINDINGS: There is new consolidation of the left upper lung zone, obscuring the previously noted mass. This likely reflects  postobstructive pneumonia. There is no evidence of pleural effusion or pneumothorax. The heart is normal in size; the mediastinal contour is within normal limits. No acute osseous abnormalities are seen. IMPRESSION: New consolidation of the left upper lung zone, obscuring the previously noted mass. This likely reflects postobstructive pneumonia. Electronically Signed   By: Garald Balding M.D.   On: 10/22/2016 19:07   Dg Chest Fluoro  Result Date: 10/29/2016 CLINICAL DATA:  Large left upper lobe mass with mediastinal involvement. Clinical concern for phrenic nerve dysfunction. EXAM: CHEST FLUOROSCOPY TECHNIQUE: Real-time fluoroscopic evaluation of the chest was performed. FLUOROSCOPY TIME:  Fluoroscopy Time:  0 minutes and 12 seconds. Radiation Exposure Index (if provided by the fluoroscopic device): 10 mGy Number of Acquired Spot Images: 0 COMPARISON:  None. FINDINGS: With a sniff maneuver, there is appropriate and expected bilateral hemi diaphragmatic excursion. No evidence for paradoxical hemi diaphragmatic motion. IMPRESSION: Normal motion of each hemidiaphragm with sniff maneuver. Electronically Signed   By: Misty Stanley M.D.   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 PET-CT 02/28/2016. No interval intervention per patient request. EXAM: CT CHEST WITH CONTRAST TECHNIQUE: Multidetector CT imaging of the chest was performed during intravenous contrast administration. CONTRAST:  50mL ISOVUE-300 IOPAMIDOL (ISOVUE-300) INJECTION 61% COMPARISON:  PET-CT 02/28/2016 FINDINGS: Cardiovascular: Coronary artery calcification and aortic atherosclerotic calcification. Mediastinum/Nodes: No axillary or supraclavicular adenopathy. Interval enlargement AP window node measuring 22 mm increased from 12 mm. Lungs/Pleura: LEFT upper lobe suprahilar mass is increased in size measuring 9.3 x 6.3 cm increased from 5.6 x 5.7 cm.  The mass surrounds the LEFT upper lobe bronchus with collapse of the LEFT upper lobe. Difficult to separate postobstructive collapse versus tumor. Favor majority of the consolidation in the LEFT upper lobe to represent tumor. No contralateral pulmonary metastasis.  Emphysema the upper lobes. Upper Abdomen: New large enhancing mass lesion in the spleen measuring 7 cm x 7.7 cm New metastatic adenopathy adjacent to the pancreatic tail measuring 2.5 cm (image 145, series 2). Additional smaller nodal metastasis in the gastrosplenic ligament Cysts within the LEFT hepatic lobe is stable. Other small hypodensities unchanged. No adrenal mass identified. New lesion along the distal esophagus measuring 12 mm by 16 mm (image 117, series 2) is favored a metastatic lymph node partially compressing the esophagus Musculoskeletal: No aggressive osseous lesion. IMPRESSION: 1. 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 2. Marked increase in size of AP window metastatic lymph node. 3. New large 7 cm metastatic lesion to the spleen. 4. New metastatic adenopathy along the pancreatic tail, upper abdomen, and middle mediastinal adjacent to the distal esophagus. These results will be called to the ordering clinician or representative by the Radiologist Assistant, and communication documented in the PACS or zVision Dashboard. Electronically Signed   By: Suzy Bouchard M.D.   On: 10/28/2016 13:43   Nm Pulmonary Perf And Vent  Result Date: 10/29/2016 CLINICAL DATA:  Evaluate for pulmonary embolus. Shortness of breath. COPD. Left lung mass. EXAM: NUCLEAR MEDICINE VENTILATION - PERFUSION LUNG SCAN TECHNIQUE: Ventilation images were obtained in multiple projections using inhaled aerosol Tc-60m DTPA. Perfusion images were obtained in  multiple projections after intravenous injection of Tc-56m MAA. RADIOPHARMACEUTICALS:  29.0 mCi Technetium-23m DTPA aerosol inhalation and 4.3 mCi Technetium-101m MAA IV COMPARISON:  CT 10/28/2016. Chest x-ray 10/26/2016. ET-CT 02/06/2016. FINDINGS: Patchy matched ventilation and perfusion defects are noted throughout both lungs with particularly poor ventilation perfusion of the left lung. Defect noted in the left upper lung consistent with known mass. This scan is indeterminate for pulmonary embolus. IMPRESSION: Very poor ventilation perfusion throughout both lungs, particularly the left, with patchy multiple matched defects. Indeterminate scan for pulmonary embolus . Electronically Signed   By: Marcello Moores  Register   On: 10/29/2016 15:29    Labs:  CBC:  Recent Labs  10/22/16 1739 10/29/16 0439 10/31/16 0326  WBC 12.6* 9.5 9.3  HGB 11.5* 12.0 11.5*  HCT 36.1 37.4 36.3  PLT 268 234 236    COAGS: No results for input(s): INR, APTT in the last 8760 hours.  BMP:  Recent Labs  10/23/16 0412 10/26/16 0343 10/29/16 0439 10/31/16 0326  NA 139 141 138 137  K 3.8 4.2 3.7 4.0  CL 103 105 104 104  CO2 28 26 25 26   GLUCOSE 142* 152* 106* 97  BUN 8 11 7  14  CALCIUM 9.8 9.4 9.0 8.5*  CREATININE 0.42* 0.41* 0.35* 0.45  GFRNONAA >60 >60 >60 >60  GFRAA >60 >60 >60 >60    LIVER FUNCTION TESTS:  Recent Labs  10/23/16 0412  BILITOT 0.4  AST 21  ALT 16  ALKPHOS 95  PROT 7.4  ALBUMIN 3.1*    TUMOR MARKERS: No results for input(s): AFPTM, CEA, CA199, CHROMGRNA in the last 8760 hours.  Assessment and Plan: 71 y.o. female , former smoker, with oxygen-dependent COPD who was recently admitted to the hospital with progressive dyspnea. She has a known history of hypermetabolic left upper lobe lung mass dating back to 2017. At that time patient was approached regarding bronchoscopic biopsy but she declined. Latest CT of chest done on 6/24 reveals significant enlargement of the left upper lobe  mass occupying the greater portion of the left upper lobe and constricting the left upper lobe bronchus. This also marked increase in size of AP window metastatic lymph node and new large 7 cm metastatic lesion to the spleen as well as new metastatic adenopathy along the pancreatic tail, upper abdomen and middle mediastinal region adjacent to the distal esophagus. Request now received from critical care for CT-guided left lung mass biopsy. Imaging studies have been reviewed by Dr. Vernard Gambles. Lung mass appears amenable to percutaneous biopsy. Details/risks of procedure, including but not limited to, internal bleeding, infection, pneumothorax requiring chest tube placement, and death discussed with patient. She wishes to consider matter further before giving final consent. We will continue to monitor and schedule when and if she decides to proceed.    Thank you for this interesting consult.  I greatly enjoyed meeting Brooke [name] and look forward to participating in their care.  A copy of this report was sent to the requesting provider on this date.  Electronically Signed: D. Rowe Robert, PA-C 10/31/2016, 4:27 PM   I spent a total of 40 minutes  in face to face in clinical consultation, greater than 50% of which was counseling/coordinating care for CT-guided left lung mass biopsy

## 2016-10-31 NOTE — Care Management Important Message (Signed)
Important Message  Patient Details  Name: Brooke [name] MRN: [id number] Date of Birth: 10-22-1945   Medicare Important Message Given:  Yes    Kerin Salen 10/31/2016, 11:45 AMImportant Message  Patient Details  Name: Brooke [name] MRN: [id number] Date of Birth: 06/28/1945   Medicare Important Message Given:  Yes    Kerin Salen 10/31/2016, 11:45 AM

## 2016-10-31 NOTE — Progress Notes (Signed)
Physical Therapy Treatment Patient Details Name: Brooke [name] MRN: [id number] DOB: 01-Dec-1945 Today's Date: 10/31/2016    History of Present Illness Pt was admitted for CAP.  H/O COPD    PT Comments    Pt progressing slowly with mobility at this time.  Tolerated sit/stand transfer bedside.  Unable to attempt amb due to fatigue & SOB.  Pt requesting breathing tx from respiratory therapy (RT).  RN notified & made call to RT.     Follow Up Recommendations  SNF     Equipment Recommendations  None recommended by PT    Recommendations for Other Services       Precautions / Restrictions Precautions Precautions: Fall Restrictions Weight Bearing Restrictions: No    Mobility  Bed Mobility Overal bed mobility: Modified Independent                Transfers Overall transfer level: Needs assistance Equipment used: Rolling walker (2 wheeled) Transfers: Sit to/from Stand Sit to Stand: Min guard         General transfer comment: cues for safe hand placement  Ambulation/Gait             General Gait Details: Unable to attempt due to fatigue & SOB.     Stairs            Wheelchair Mobility    Modified Rankin (Stroke Patients Only)       Balance                                            Cognition Arousal/Alertness: Awake/alert Behavior During Therapy: WFL for tasks assessed/performed Overall Cognitive Status: Within Functional Limits for tasks assessed                                        Exercises      General Comments        Pertinent Vitals/Pain Pain Assessment: No/denies pain    Home Living                      Prior Function            PT Goals (current goals can now be found in the care plan section) Acute Rehab PT Goals Patient Stated Goal: get back to being independent PT Goal Formulation: With patient Time For Goal Achievement: 11/09/16 Potential to Achieve Goals:  Fair Progress towards PT goals: Progressing toward goals (slowly)    Frequency    Min 3X/week      PT Plan Current plan remains appropriate    Co-evaluation              AM-PAC PT "6 Clicks" Daily Activity  Outcome Measure                   End of Session               Time: 0215-0227 PT Time Calculation (min) (ACUTE ONLY): 12 min  Charges:  $Therapeutic Activity: 8-22 mins                    G Codes:       Sarajane Marek, PTA  10/31/2016    Sena Hitch 10/31/2016, 3:21 PM

## 2016-11-01 ENCOUNTER — Inpatient Hospital Stay (HOSPITAL_COMMUNITY): Payer: Medicare Other

## 2016-11-01 ENCOUNTER — Encounter (HOSPITAL_COMMUNITY)
Admission: EM | Disposition: A | Discharge status: Skilled Nursing Facility | Payer: Self-pay | Source: Home / Self Care | Attending: Family Medicine

## 2016-11-01 ENCOUNTER — Encounter (HOSPITAL_COMMUNITY): Payer: Medicare Other

## 2016-11-01 ENCOUNTER — Encounter (HOSPITAL_COMMUNITY): Payer: Self-pay | Admitting: Radiology

## 2016-11-01 LAB — CBC WITH DIFFERENTIAL/PLATELET
Basophils Absolute: 0 10*3/uL (ref 0.0–0.1)
Basophils Relative: 0 %
Eosinophils Absolute: 0.1 10*3/uL (ref 0.0–0.7)
Eosinophils Relative: 1 %
HEMATOCRIT: 35.7 % — AB (ref 36.0–46.0)
Hemoglobin: 11.3 g/dL — ABNORMAL LOW (ref 12.0–15.0)
LYMPHS ABS: 1.4 10*3/uL (ref 0.7–4.0)
LYMPHS PCT: 16 %
MCH: 23.7 pg — ABNORMAL LOW (ref 26.0–34.0)
MCHC: 31.7 g/dL (ref 30.0–36.0)
MCV: 74.8 fL — AB (ref 78.0–100.0)
MONO ABS: 1.2 10*3/uL — AB (ref 0.1–1.0)
MONOS PCT: 13 %
NEUTROS ABS: 6.4 10*3/uL (ref 1.7–7.7)
Neutrophils Relative %: 70 %
Platelets: 238 10*3/uL (ref 150–400)
RBC: 4.77 MIL/uL (ref 3.87–5.11)
RDW: 16.6 % — AB (ref 11.5–15.5)
WBC: 9.1 10*3/uL (ref 4.0–10.5)

## 2016-11-01 LAB — PROTIME-INR
INR: 0.99
Prothrombin Time: 13.1 seconds (ref 11.4–15.2)

## 2016-11-01 SURGERY — ENDOBRONCHIAL ULTRASOUND (EBUS)
Anesthesia: General | Laterality: Bilateral

## 2016-11-01 MED ORDER — MIDAZOLAM HCL 2 MG/2ML IJ SOLN
INTRAMUSCULAR | Status: AC | PRN
  Administered 2016-11-01: 1 mg via INTRAVENOUS

## 2016-11-01 MED ORDER — LIDOCAINE HCL 1 % IJ SOLN
INTRAMUSCULAR | Status: AC | PRN
  Administered 2016-11-01: 10 mL

## 2016-11-01 MED ORDER — ENOXAPARIN SODIUM 40 MG/0.4ML ~~LOC~~ SOLN
40.0000 mg | SUBCUTANEOUS | Status: DC
  Administered 2016-11-01: 40 mg via SUBCUTANEOUS
  Filled 2016-11-01: qty 0.4

## 2016-11-01 MED ORDER — FENTANYL CITRATE (PF) 100 MCG/2ML IJ SOLN
INTRAMUSCULAR | Status: AC
  Filled 2016-11-01: qty 4

## 2016-11-01 MED ORDER — FENTANYL CITRATE (PF) 100 MCG/2ML IJ SOLN
INTRAMUSCULAR | Status: AC | PRN
  Administered 2016-11-01: 50 ug via INTRAVENOUS

## 2016-11-01 MED ORDER — MIDAZOLAM HCL 2 MG/2ML IJ SOLN
INTRAMUSCULAR | Status: AC
  Filled 2016-11-01: qty 4

## 2016-11-01 NOTE — Progress Notes (Signed)
Met with pt to discuss expectations for DC plan. Pt reports at this time plan to participate in therapy at SNF with palliative care following at DC.  Contacted [facility] (pt's choice for SNF) with this information. Initiated [employer] authorization (past authorization expired 10/31/16).  Will follow to assist with transfer to SNF at DC.  Sharren Bridge, MSW, LCSW Clinical Social Work 11/01/2016 [phone]

## 2016-11-01 NOTE — Progress Notes (Signed)
PMT progress note  Ms Ohagan has returned from her biopsy, she appears comfortable, in no distress BP 119/70 (BP Location: Right Arm)   Pulse 78   Temp 98.5 F (36.9 C) (Oral)   Resp 16   Ht 5\' 3"  (1.6 m)   Wt 53.5 kg (118 lb)   SpO2 99%   BMI 20.90 kg/m  Labs noted IR notes reviewed Awake alert Not very conversational this am No edema Appears to have regular breathing pattern Abdomen not distended  Discussed with Dr Bonner Puna this am.  Recommend SNF rehab with palliative following on discharge.  Med onc also following Await biopsy results 15 minutes spent  Loistine Chance MD [phone] Palliative medicine team

## 2016-11-01 NOTE — Procedures (Signed)
Interventional Radiology Procedure Note  Procedure: CT guided biopsy of left upper lobe Complications: None Recommendations: - Bedrest until CXR cleared.  Minimize talking, coughing or otherwise straining.  - Follow up 1 hr CXR pending  - NPO until CXR cleared  Signed,  Corrie Mckusick, DO

## 2016-11-01 NOTE — Progress Notes (Signed)
MEDICATION-RELATED CONSULT NOTE   IR Procedure Consult - Anticoagulant/Antiplatelet PTA/Inpatient Med List Review by Pharmacist    Procedure: CT guided biopsy of left upper lobe, bone lesion biopsy    Completed: 10:42  Post-Procedural bleeding risk per IR MD assessment:  Low  Antithrombotic medications on inpatient or PTA profile prior to procedure:   Lovenox 40mg  SQ q24h    Recommended restart time per IR Post-Procedure Guidelines:  Day 0, at least 4 hours   Other considerations:      Plan:    Restart Lovenox 40mg  SQ q24h at 18:00 today  Peggyann Juba, PharmD, BCPS Pager: [phone] 11/01/2016 10:50 AM

## 2016-11-01 NOTE — Procedures (Deleted)
Interventional Radiology Procedure Note  Procedure: Bone lesion biopsy of T9 lesion, suspicious for breast CA mets.   .  Complications: None  Recommendations:   - 1 hour recovery - Ok to shower tomorrow - Do not submerge for 7 days - Routine wound care   Signed,  Dulcy Fanny. Earleen Newport, DO

## 2016-11-01 NOTE — Progress Notes (Signed)
[facility]  PCCM will see again if needed.  Please call us as needed.  Roselie Awkward, MD [facility] Pager: [phone] Cell: [phone] After 3pm or if no response, call [phone]

## 2016-11-01 NOTE — Progress Notes (Signed)
PROGRESS NOTE  Brooke [name]  ZJI:[id number] DOB: 1945/06/28 DOA: 10/22/2016 PCP: [name], MD  Outpatient Specialists: [facility] Brief Narrative: Brooke [name] is a 71 y.o. female with a history of O2-dependent COPD, h/o tobacco use until 2009, and LUL mass without tissue diagnosis who presented 6/18 with respiratory distress found to have left-sided postobstructive pneumonia. She has improved mildly with antibiotics and steroids but remains significantly more dyspneic and weak than baseline. Pulmonology was consulted, CT chest w/contrast demonstrated advancement of local LUL and new metastatic disease. The patient was reluctant to undergo bronchoscopy for biopsy. Oncology was consulted to inform decision whether a tissue diagnosis would provide actionable information, and the patient would like to proceed with biopsy with the hope for definitive diagnosis and targeted palliative treatment. Her symptom burden remains significant, and palliative care has been consulted. CT-guided biopsy was performed 6/28 and the patient has remained stable. Plan is to discharge to SNF with palliative care services and to follow up with oncology regarding treatment based on biopsy results.   Assessment & Plan: Principal Problem:   Community acquired pneumonia of left upper lobe of lung ([place]) Active Problems:   COPD  GOLD IV    Mass of upper lobe of left lung   Chronic respiratory failure with hypoxia (HCC)   COPD exacerbation (HCC)   Hypokalemia   Microcytic anemia   SOB (shortness of breath)  Acute on chronic hypoxemic respiratory failure: Due to large lung mass, pneumonia on COPD, 2L home O2-dependent since 2014. s/p Tx of PNA and COPD exacerbation with little improvement. V/Q scan intermediate with multiple defects, though these were matched.  - Continue supplemental oxygen and treatment of conditions as below - Pulmonology signed off, will follow up as outpatient.   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 Due to high risk for complications, the patient had declined further evaluation/biopsy. CT chest 10/28/16 shows significant enlargement of LUL mass now constricting LUL bronchus in addition to metastatic LN, large splenic metastasis, and metastatic lymphadenopathy involving the pancreatic tail, upper abdomen, and paraesophageal mediastinum. Normal chest fluoroscopy. - Oncology consult appreciated, hopeful that pt would be candidate for targeted therapy/immunotherapy.  - Follow up pathology results of CT-guided biopsy 6/28.  - Appreciate palliative care assistance. Plan for DC to SNF with palliative following. Remains partial code.  Left upper lobe pneumonia: Possibly post-obstructive behind LUL mass noted in 2017.  - PCT reassuring, s/p 7 days abx. - Acute debility/weakness has made her high risk for complications, falls with return to home, so plan is to discharge to SNF for rehabilitation per PT recommendations.   GOLD IV COPD with acute exacerbation: Spirometry 02/15/2016: FEV1 0.60 (28%). Ratio 38. Exacerbation not thought to be primary cause of acute decompensation, though is causing diminished pulmonary reserve.  - Continue bronchodilators, mucolytics - Completed steroids 6/25. No wheezing.    Iron-deficiency anemia: Workup reveals low iron saturation. - Iron supplement ordered - Defer further evaluation to outpatient setting in absence of active gross bleeding.   DVT prophylaxis: Lovenox Code Status: BiPAP is ok, otherwise DNR/DNI Family Communication: None at bedside this AM. Disposition Plan: DC to SNF with palliative care if stable following biopsy.   Consultants:   Pulmonology, Dr. Melvyn Novas > Dr. Lake Bells  Oncology, [name]  Palliative care team, Dr. Rowe Pavy  IR  Procedures:   CT-guided biopsy of left upper lobe mass by Dr. Earleen Newport 6/28.   Antimicrobials:  Ceftriaxone 6/18 >>  6/24  Azithromycin 6/18 >>  6/24  Subjective: Dyspnea a bit worse following biopsy. No chest pain. Anxious.   Objective: Vitals:   11/01/16 1000 11/01/16 1100 11/01/16 1200 11/01/16 1439  BP: 119/70 123/68 116/87 110/74  Pulse: 78 83 86 84  Resp: 16 12 16 16   Temp: 98.5 F (36.9 C)  99 F (37.2 C) 98.7 F (37.1 C)  TempSrc: Oral  Oral Oral  SpO2: 99% 97% 96% 98%  Weight:      Height:        Intake/Output Summary (Last 24 hours) at 11/01/16 1611 Last data filed at 11/01/16 0523  Gross per 24 hour  Intake                0 ml  Output              200 ml  Net             -200 ml   Filed Weights   10/22/16 2129  Weight: 53.5 kg (118 lb)    Examination: General exam: Frail female in no distress sitting up in bed Respiratory system: Mildly labored on supplemental oxygen. Decreased aeration throughout, worse on left. Dullness to percussion on left.  Cardiovascular system: Regular rate and rhythm. No murmur, rub, or gallop. No JVD, and no pedal edema. Gastrointestinal system: Abdomen soft, non-tender, non-distended, with normoactive bowel sounds. No organomegaly or masses felt. Central nervous system: Alert and oriented. No focal neurological deficits. Extremities: Warm, + clubbing without cyanosis. Skin: Anterior chest wall with hemostatic puncture wound. Psychiatry: Judgement and insight appear normal. Mood & affect appropriate.   CBC:  Recent Labs Lab 10/29/16 0439 10/31/16 0326 11/01/16 0348  WBC 9.5 9.3 9.1  NEUTROABS  --   --  6.4  HGB 12.0 11.5* 11.3*  HCT 37.4 36.3 35.7*  MCV 74.9* 75.3* 74.8*  PLT 234 236 258   Basic Metabolic Panel:  Recent Labs Lab 10/26/16 0343 10/29/16 0439 10/31/16 0326  NA 141 138 137  K 4.2 3.7 4.0  CL 105 104 104  CO2 26 25 26   GLUCOSE 152* 106* 97  BUN 11 7 14   CREATININE 0.41* 0.35* 0.45  CALCIUM 9.4 9.0 8.5*   GFR: Estimated Creatinine Clearance: 53.4 mL/min (by C-G formula based on SCr of 0.45 mg/dL).   Recent  Results (from the past 240 hour(s))  Culture, sputum-assessment     Status: None   Collection Time: 10/22/16 11:55 PM  Result Value Ref Range Status   Specimen Description SPUTUM  Final   Special Requests NONE  Final   Sputum evaluation THIS SPECIMEN IS ACCEPTABLE FOR SPUTUM CULTURE  Final   Report Status 10/23/2016 FINAL  Final  Culture, respiratory (NON-Expectorated)     Status: None   Collection Time: 10/22/16 11:55 PM  Result Value Ref Range Status   Specimen Description SPUTUM  Final   Special Requests NONE Reflexed from [id number]  Final   Gram Stain   Final    ABUNDANT WBC PRESENT, PREDOMINANTLY PMN RARE SQUAMOUS EPITHELIAL CELLS PRESENT FEW GRAM POSITIVE COCCI IN PAIRS FEW GRAM NEGATIVE COCCOBACILLI RARE GRAM POSITIVE RODS    Culture   Final    Consistent with normal respiratory flora. Performed at [facility], [place] [address] [place], [place] [address]    Report Status 10/25/2016 FINAL  Final  Respiratory Panel by PCR     Status: None   Collection Time: 10/23/16  1:49 PM  Result Value Ref Range Status  Adenovirus NOT DETECTED NOT DETECTED Final   Coronavirus 229E NOT DETECTED NOT DETECTED Final   Coronavirus HKU1 NOT DETECTED NOT DETECTED Final   Coronavirus NL63 NOT DETECTED NOT DETECTED Final   Coronavirus OC43 NOT DETECTED NOT DETECTED Final   Metapneumovirus NOT DETECTED NOT DETECTED Final   Rhinovirus / Enterovirus NOT DETECTED NOT DETECTED Final   Influenza A NOT DETECTED NOT DETECTED Final   Influenza B NOT DETECTED NOT DETECTED Final   Parainfluenza Virus 1 NOT DETECTED NOT DETECTED Final   Parainfluenza Virus 2 NOT DETECTED NOT DETECTED Final   Parainfluenza Virus 3 NOT DETECTED NOT DETECTED Final   Parainfluenza Virus 4 NOT DETECTED NOT DETECTED Final   Respiratory Syncytial Virus NOT DETECTED NOT DETECTED Final   Bordetella pertussis NOT DETECTED NOT DETECTED Final   Chlamydophila pneumoniae NOT DETECTED NOT DETECTED Final   Mycoplasma pneumoniae  NOT DETECTED NOT DETECTED Final    Comment: Performed at [facility], [place] [address] [place], [place] [address]      Radiology Studies: Ct Biopsy  Result Date: 12/01/2016 INDICATION: 70 year old female with a history of left upper lobe mass. EXAM: CT BIOPSY LUNG MEDICATIONS: None. ANESTHESIA/SEDATION: Moderate (conscious) sedation was employed during this procedure. A total of Versed 1.0 mg and Fentanyl 50 mcg was administered intravenously. Moderate Sedation Time: 17 minutes. The patient's level of consciousness and vital signs were monitored continuously by radiology nursing throughout the procedure under my direct supervision. FLUOROSCOPY TIME:  CT). COMPLICATIONS: None PROCEDURE: The procedure, risks, benefits, and alternatives were explained to the patient and the patient's family. Specific risks that were addressed included bleeding, infection, pneumothorax, need for further procedure including chest tube placement, chance of delayed pneumothorax or hemorrhage, hemoptysis, nondiagnostic sample, cardiopulmonary collapse, death. Questions regarding the procedure were encouraged and answered. The patient understands and consents to the procedure. Patient was positioned in the supine position on the CT gantry table and a scout CT of the chest was performed for planning purposes. Once angle of approach was determined, the skin and subcutaneous tissues this scan was prepped and draped in the usual sterile fashion, and a sterile drape was applied covering the operative field. A sterile gown and sterile gloves were used for the procedure. Local anesthesia was provided with 1% Lidocaine. The skin and subcutaneous tissues were infiltrated 1% lidocaine for local anesthesia, and a small stab incision was made with an 11 blade scalpel. Using CT guidance, a 17 gauge trocar needle was advanced into the left upper lobetarget. After confirmation of the tip, separate 18 gauge core biopsies were performed. These  were placed into solution for transportation to the lab. Biosentry Device was deployed. A final CT image was performed. Patient tolerated the procedure well and remained hemodynamically stable throughout. No complications were encountered and no significant blood loss was encounter IMPRESSION: Status post CT-guided biopsy of left upper lobe mass. Tissue specimen sent to pathology for complete histopathologic analysis. Signed, Dulcy Fanny. Earleen Newport, DO [facility] Electronically Signed   By: Corrie Mckusick D.O.   On: 12/01/2016 11:40   Dg Chest Port 1 View  Result Date: December 01, 2016 CLINICAL DATA:  Follow-up post biopsy pneumothorax. EXAM: PORTABLE CHEST 1 VIEW COMPARISON:  CT scan of the chest of today's date FINDINGS: There is a large soft tissue mass in the right upper lobe. No definite pneumothorax is observed on this study. There is air within the soft tissue mass superiorly. There is compensatory hyperinflation of the right  lung with mediastinal shift toward the left. There is no pleural effusion or hemothorax. The heart is normal in size and the pulmonary vascularity is not engorged. IMPRESSION: No definite residual pneumothorax. Some air within the soft tissue mass is faintly visible similar to that seen on the post biopsy CT images. Electronically Signed   By: David  Martinique M.D.   On: 11/01/2016 10:49    Scheduled Meds: . amLODipine  2.5 mg Oral Daily  . atorvastatin  20 mg Oral QHS  . enoxaparin (LOVENOX) injection  40 mg Subcutaneous Q24H  . feeding supplement (ENSURE ENLIVE)  237 mL Oral TID BM  . fentaNYL      . ferrous sulfate  325 mg Oral BID WC  . guaiFENesin  1,200 mg Oral BID  . ipratropium-albuterol  3 mL Nebulization Q6H  . midazolam      . mometasone-formoterol  2 puff Inhalation BID   Continuous Infusions: . 0.9 % NaCl with KCl 20 mEq / L 75 mL/hr at 10/31/16 1853     LOS: 10 days   Time spent: 25 minutes.  Vance Gather,  MD [facility] Pager [phone]  If 7PM-7AM, please contact night-coverage [email or web address] Password [email or web address] 11/01/2016, 4:11 PM

## 2016-11-02 DIAGNOSIS — E43 Unspecified severe protein-calorie malnutrition: Secondary | ICD-10-CM | POA: Insufficient documentation

## 2016-11-02 MED ORDER — MOMETASONE FURO-FORMOTEROL FUM 200-5 MCG/ACT IN AERO: 2.0000 | INHALATION_SPRAY | Freq: Two times a day (BID) | RESPIRATORY_TRACT | Status: DC

## 2016-11-02 MED ORDER — FERROUS SULFATE 325 (65 FE) MG PO TABS: 325.0000 mg | ORAL_TABLET | Freq: Two times a day (BID) | ORAL | 3 refills | Status: DC

## 2016-11-02 MED ORDER — ALPRAZOLAM 0.25 MG PO TABS: 0.1250 mg | ORAL_TABLET | Freq: Three times a day (TID) | ORAL | 0 refills | Status: DC | PRN

## 2016-11-02 NOTE — Progress Notes (Signed)
Physical Therapy Treatment Patient Details Name: Glynnis Gavel MRN: [id number] DOB: [birth year] Today's Date: 11/02/2016    History of Present Illness Pt was admitted for CAP.  H/O COPD    PT Comments    Assisted pt OOB to amb a limited distance on 6 lts nasal.  Very unsteady gait and required several standing rest breaks due to dyspnea.   Pt will need short tern Rehab at [facility] prior to returing home  Follow Up Recommendations  SNF     Equipment Recommendations  None recommended by PT    Recommendations for Other Services       Precautions / Restrictions Precautions Precautions: Fall Precaution Comments: monitor O2 sats Restrictions Weight Bearing Restrictions: No    Mobility  Bed Mobility Overal bed mobility: Modified Independent             General bed mobility comments: with rail and increased time.  BSC placed next to bed and pt able to transfer self on/oo at lib  Transfers Overall transfer level: Needs assistance Equipment used: Rolling walker (2 wheeled) Transfers: Sit to/from Stand Sit to Stand: Supervision Stand pivot transfers: Supervision       General transfer comment: supervision for safety negociating lines/O2 tubing  Ambulation/Gait Ambulation/Gait assistance: Min guard;Min assist Ambulation Distance (Feet): 47 Feet Assistive device:  (pushing IV pole) Gait Pattern/deviations: Step-to pattern;Step-through pattern Gait velocity: decreased   General Gait Details: several standing rerst breaks with noted 3/5 DOE on 6 lts nasal range from 87 - 90%.     Stairs            Wheelchair Mobility    Modified Rankin (Stroke Patients Only)       Balance                                            Cognition Arousal/Alertness: Awake/alert Behavior During Therapy: WFL for tasks assessed/performed Overall Cognitive Status: Within Functional Limits for tasks assessed                                         Exercises      General Comments        Pertinent Vitals/Pain Pain Assessment: No/denies pain    Home Living                      Prior Function            PT Goals (current goals can now be found in the care plan section) Progress towards PT goals: Progressing toward goals    Frequency    Min 3X/week      PT Plan Current plan remains appropriate    Co-evaluation              AM-PAC PT "6 Clicks" Daily Activity  Outcome Measure  Difficulty turning over in bed (including adjusting bedclothes, sheets and blankets)?: Total Difficulty moving from lying on back to sitting on the side of the bed? : Total Difficulty sitting down on and standing up from a chair with arms (e.g., wheelchair, bedside commode, etc,.)?: Total Help needed moving to and from a bed to chair (including a wheelchair)?: A Lot Help needed walking in hospital room?: A Lot Help needed climbing 3-5 steps with a railing? :  Total 6 Click Score: 8    End of Session Equipment Utilized During Treatment: Gait belt;Oxygen Activity Tolerance: Patient limited by fatigue Patient left: in bed Nurse Communication: Mobility status PT Visit Diagnosis: Muscle weakness (generalized) (M62.81);Difficulty in walking, not elsewhere classified (R26.2)     Time: 9826-4158 PT Time Calculation (min) (ACUTE ONLY): 23 min  Charges:  $Gait Training: 8-22 mins $Therapeutic Activity: 8-22 mins                    G Codes:       Rica Koyanagi  PTA [facility]  Acute  Rehab Pager      [phone]

## 2016-11-02 NOTE — Clinical Social Work Placement (Signed)
Pt discharging to [facility] today. Provided all information to facility via the [facility] transport via [place] completed medical necessity form and arranged transport. Pt states she informed her exhusband. Report # for RN: [phone] See below for placement details  Authorization [id number] obtained from [facility]. RUG: RVA, 518 min/week, next review date 7/1. This information was provided to facility.   CLINICAL SOCIAL WORK PLACEMENT  NOTE  Date:  11/02/2016  Patient Details  Name: Brooke [name] MRN: [id number] Date of Birth: October 15, 1945  Clinical Social Work is seeking post-discharge placement for this patient at the   level of care (*CSW will initial, date and re-position this form in  chart as items are completed):      Patient/family provided with [facility] Work Department's list of facilities offering this level of care within the geographic area requested by the patient (or if unable, by the patient's family).  Yes   Patient/family informed of their freedom to choose among providers that offer the needed level of care, that participate in Medicare, Medicaid or managed care program needed by the patient, have an available bed and are willing to accept the patient.  Yes   Patient/family informed of [facility]'s ownership interest in [facility] and [facility], as well as of the fact that they are under no obligation to receive care at these facilities.  PASRR submitted to EDS on       PASRR number received on 10/25/16     Existing PASRR number confirmed on       FL2 transmitted to all facilities in geographic area requested by pt/family on 10/25/16     FL2 transmitted to all facilities within larger geographic area on       Patient informed that his/her managed care company has contracts with or will negotiate with certain facilities, including the following:        Yes   Patient/family informed of bed  offers received.  Patient chooses bed at [facility]     Physician recommends and patient chooses bed at [facility]    Patient to be transferred to [facility] on 11/02/16.  Patient to be transferred to facility by [facility]     Patient family notified on 11/02/16 of transfer.  Name of family member notified:  ex spouse [facility]     PHYSICIAN       Additional Comment:    _______________________________________________ Nila Nephew, LCSW 11/02/2016, 1:13 PM

## 2016-11-02 NOTE — Progress Notes (Signed)
Initial Nutrition Assessment  DOCUMENTATION CODES:   Severe malnutrition in context of acute illness/injury  INTERVENTION:  - Continue Ensure Enlive TID, each supplement provides 350 kcal and 20 grams of protein - Continue to encourage PO intakes of meals and supplements. - RD will continue to monitor for POC/GOC and associated needs.  NUTRITION DIAGNOSIS:   Malnutrition (severe) related to acute illness (acute on chronic COPD, lung mass s/p biopsy 11/01/16) as evidenced by moderate depletions of muscle mass, moderate depletion of body fat, percent weight loss.  GOAL:   Patient will meet greater than or equal to 90% of their needs  MONITOR:   PO intake, Supplement acceptance, Weight trends, Labs  REASON FOR ASSESSMENT:   LOS  ASSESSMENT:   71 y.o. female with a past medical history significant for COPD on home O2, CAD and lung mass, presumed cancer, presented with dyspnea. A few days PTA pt started to develop shortness of breath with exertion, subjective fevers, worsening wheezing, frequent thick sputum. This then developed into shortness of breath with any exertion at all, left-sided chest pressure, and worsening wheezing.  Pt seen for LOS day #11. BMI indicates normal weight; no new weight since admission. Per chart review, pt did not eating breakfast or lunch and had 50% of dinner on 6/24; 50% of breakfast and 100% of lunch and dinner 6/25; 100% of dinner 6/26. Pt reports that for the first week of admission she was on Heart Healthy diet and unable to eat well d/t lack of flavor to food and d/t issues with medications. She gives the example of being on the wrong medications which was making it difficult to swallow even with chewing food thoroughly. Pt reports that since that time, medications have been adjusted and MD changed her from Heart Healthy to Regular diet and that she has been eating well since that time. She has not yet received breakfast tray but ordered scrambled eggs and  bacon. Unable to obtain much PTA information as pt preferred to discuss points surrounding medications and food since admission. She states that she has been drinking Ensure supplements and was drinking them more frequently during the first week of admission. Yesterday she had an Ensure for breakfast meal.   Physical assessment shows moderate muscle and fat wasting to upper body with lower body not assessed at this time. Per chart review, pt has lost 12 lbs (9% body weight) in the past 1 month. This is significant for time frame.   She had lung biopsy yesterday and is pending results. Palliative Care is following pt. Plan for SNF when stable for d/c and Palliative note yesterday states recommendation for Palliative following at the facility.   Medications reviewed; 325 mg ferrous sulfate BID.  Labs reviewed; Ca: 8.5 mg/dL.  IVF: NS-20 mEq KCl @ 75 mL/hr.    Diet Order:  Diet regular Room service appropriate? Yes; Fluid consistency: Thin  Skin:  Reviewed, no issues  Last BM:  6/28  Height:   Ht Readings from Last 1 Encounters:  10/22/16 5\' 3"  (1.6 m)    Weight:   Wt Readings from Last 1 Encounters:  10/22/16 118 lb (53.5 kg)    Ideal Body Weight:  52.27 kg  BMI:  Body mass index is 20.9 kg/m.  Estimated Nutritional Needs:   Kcal:  1605-1820 (30-34 kcal/kg)  Protein:  65-75 grams  Fluid:  1.6-1.8 L/day  EDUCATION NEEDS:   No education needs identified at this time    Jarome Matin, MS, RD, LDN,  [facility] Inpatient Clinical Dietitian Pager # [phone] After hours/weekend pager # [phone]

## 2016-11-02 NOTE — Progress Notes (Signed)
IP PROGRESS NOTE  Subjective:   Brooke [name] underwent a CT-guided biopsy of the left lung mass yesterday. She reports tolerating the procedure well. She is being evaluated for skilled nursing facility placement.  Objective: Vital signs in last 24 hours: Blood pressure 121/73, pulse 87, temperature 98.5 F (36.9 C), temperature source Oral, resp. rate 16, height 5\' 3"  (1.6 m), weight 118 lb (53.5 kg), SpO2 96 %.  Intake/Output from previous day: 06/28 0701 - 06/29 0700 In: -  Out: 800 [Urine:800]  Physical Exam: Not performed today    Lab Results:  Recent Labs  10/31/16 0326 11/01/16 0348  WBC 9.3 9.1  HGB 11.5* 11.3*  HCT 36.3 35.7*  PLT 236 238    BMET  Recent Labs  10/31/16 0326  NA 137  K 4.0  CL 104  CO2 26  GLUCOSE 97  BUN 14  CREATININE 0.45  CALCIUM 8.5*    Studies/Results: Ct Biopsy  Result Date: 11/01/2016 INDICATION: 71 year old female with a history of left upper lobe mass. EXAM: CT BIOPSY LUNG MEDICATIONS: None. ANESTHESIA/SEDATION: Moderate (conscious) sedation was employed during this procedure. A total of Versed 1.0 mg and Fentanyl 50 mcg was administered intravenously. Moderate Sedation Time: 17 minutes. The patient's level of consciousness and vital signs were monitored continuously by radiology nursing throughout the procedure under my direct supervision. FLUOROSCOPY TIME:  CT). COMPLICATIONS: None PROCEDURE: The procedure, risks, benefits, and alternatives were explained to the patient and the patient's family. Specific risks that were addressed included bleeding, infection, pneumothorax, need for further procedure including chest tube placement, chance of delayed pneumothorax or hemorrhage, hemoptysis, nondiagnostic sample, cardiopulmonary collapse, death. Questions regarding the procedure were encouraged and answered. The patient understands and consents to the procedure. Patient was positioned in the supine position on the CT gantry table and a  scout CT of the chest was performed for planning purposes. Once angle of approach was determined, the skin and subcutaneous tissues this scan was prepped and draped in the usual sterile fashion, and a sterile drape was applied covering the operative field. A sterile gown and sterile gloves were used for the procedure. Local anesthesia was provided with 1% Lidocaine. The skin and subcutaneous tissues were infiltrated 1% lidocaine for local anesthesia, and a small stab incision was made with an 11 blade scalpel. Using CT guidance, a 17 gauge trocar needle was advanced into the left upper lobetarget. After confirmation of the tip, separate 18 gauge core biopsies were performed. These were placed into solution for transportation to the lab. Biosentry Device was deployed. A final CT image was performed. Patient tolerated the procedure well and remained hemodynamically stable throughout. No complications were encountered and no significant blood loss was encounter IMPRESSION: Status post CT-guided biopsy of left upper lobe mass. Tissue specimen sent to pathology for complete histopathologic analysis. Signed, Dulcy Fanny. Earleen Newport, DO [facility] Electronically Signed   By: Corrie Mckusick D.O.   On: 11/01/2016 11:40   Dg Chest Port 1 View  Result Date: 11/01/2016 CLINICAL DATA:  Follow-up post biopsy pneumothorax. EXAM: PORTABLE CHEST 1 VIEW COMPARISON:  CT scan of the chest of today's date FINDINGS: There is a large soft tissue mass in the right upper lobe. No definite pneumothorax is observed on this study. There is air within the soft tissue mass superiorly. There is compensatory hyperinflation of the right lung with mediastinal shift toward the left. There is no pleural effusion or hemothorax. The heart is normal in size  and the pulmonary vascularity is not engorged. IMPRESSION: No definite residual pneumothorax. Some air within the soft tissue mass is faintly  visible similar to that seen on the post biopsy CT images. Electronically Signed   By: David  Martinique M.D.   On: 11/01/2016 10:49    Medications: I have reviewed the patient's current medications.  Assessment/Plan: 1. Left lung mass  PET scan 02/28/2016-hypermetabolic left upper lobe mass, hypermetabolic adjacent nodule, hypermetabolic AP window node  CT chest 10/28/2016-enlarging left upper lobe mass, increased AP window lymphadenopathy, new spleen metastasis, new adenopathy at the pancreas tail, upper abdomen, and middle mediastinum  CT-guided biopsy of the left lung mass on 11/01/2016, pathology pending  2. Acute respiratory failure secondary to #1  3. Oxygen dependent COPD  4. History of a NSTEMI 2015  She underwent a biopsy of the left lung mass yesterday. The pathology is pending. I will follow-up on the biopsy result and see her 11/05/2016 if she remains in the hospital. We will arrange for outpatient follow-up if she is discharged.  Please call Oncology as needed.   LOS: 11 days   Donneta Romberg, MD   11/02/2016, 8:55 AM

## 2016-11-02 NOTE — Discharge Summary (Addendum)
Physician Discharge Summary  Brooke [name] BTD:[id number] DOB: 01/08/1946 DOA: 10/22/2016  PCP: [name], MD  Admit date: 10/22/2016 Discharge date: 11/02/2016  Admitted From: Home Disposition: SNF   Recommendations for Outpatient Follow-up:  1. Follow up with oncology, [name] to discuss pathology results. Cancer Center to arrange date/time.  2. Follow up with pulmonology in the next 4 weeks  3. Please follow up on the following pending results: CT-guided LUL mass biopsy performed 6/28 4. Patient to have palliative care services follow her after discharge.  Home Health: N/A Equipment/Devices: 3L O2 by [place] Discharge Condition: Stable for transfer, guarded prognosis CODE STATUS: Partial In the event of cardiac or respiratory ARREST: Initiate Code Blue, Call Rapid Response Yes  In the event of cardiac or respiratory ARREST: Perform CPR No  In the event of cardiac or respiratory ARREST: Perform Intubation/Mechanical Ventilation No  In the event of cardiac or respiratory ARREST: Use NIPPV/BiPAp only if indicated Yes  In the event of cardiac or respiratory ARREST: Administer ACLS medications if indicated No  In the event of cardiac or respiratory ARREST: Perform Defibrillation or Cardioversion if indicated No   Diet recommendation: Regular as tolerated  Brief/Interim Summary: Brooke [name] is a 71 y.o. female with a history of O2-dependent COPD, h/o tobacco use until 2009, and LUL mass without tissue diagnosis who presented 6/18 with respiratory distress found to have left-sided postobstructive pneumonia. She has improved mildly with antibiotics and steroids but remains significantly more dyspneic and weak than baseline. Pulmonology was consulted, CT chest w/contrast demonstrated advancement of local LUL and new metastatic disease. The patient was reluctant to undergo bronchoscopy for biopsy. Oncology was consulted to inform decision whether a tissue diagnosis would  provide actionable information, and the patient would like to proceed with biopsy with the hope for definitive diagnosis and targeted palliative treatment. Her symptom burden remains significant, and palliative care has been consulted. CT-guided biopsy was performed 6/28 and the patient has remained stable. Plan is to discharge to SNF with palliative care services and to follow up with oncology regarding treatment based on biopsy results.   Discharge Diagnoses:  Principal Problem:   Community acquired pneumonia of left upper lobe of lung ([place]) Active Problems:   COPD  GOLD IV    Mass of upper lobe of left lung   Chronic respiratory failure with hypoxia (HCC)   COPD exacerbation (HCC)   Hypokalemia   Microcytic anemia   SOB (shortness of breath)   Protein-calorie malnutrition, severe  Acute on chronic hypoxemic respiratory failure: Due to large lung mass, pneumonia on COPD, 2L home O2-dependent since 2014. s/p Tx of PNA and COPD exacerbation with little improvement. V/Q scan intermediate with multiple defects, though these were matched.  - Continue supplemental oxygen and treatment of conditions as below - Pulmonology signed off, will follow up as outpatient.   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 Due to high risk for complications, the patient had declined further evaluation/biopsy. CT chest 10/28/16 shows significant enlargement of LUL mass now constricting LUL bronchus in addition to metastatic LN, large splenic metastasis, and metastatic lymphadenopathy involving the pancreatic tail, upper abdomen, and paraesophageal mediastinum. Normal chest fluoroscopy. - Oncology consult appreciated, hopeful that pt would be candidate for targeted therapy/immunotherapy.  - Follow up pathology results of CT-guided biopsy 6/28.  - Appreciate palliative care assistance. Plan for DC to SNF with palliative following. Remains partial code. ADDENDUM: Path report  returned: NON SMALL  CELL CARCINOMA, MOST CONSISTENT WITH SQUAMOUS CELL CARCINOMA.  Left upper lobe pneumonia: Possibly post-obstructive behind LUL mass noted in 2017.  - PCT reassuring, s/p 7 days abx. - Acute debility/weakness has made her high risk for complications, falls with return to home, so plan is to discharge to SNF for rehabilitation per PT recommendations.   GOLD IV COPD with acute exacerbation: Spirometry 02/15/2016:FEV1 0.60(28%). Ratio 38. Exacerbation not thought to be primary cause of acute decompensation, though is causing diminished pulmonary reserve.  - Continue bronchodilators: Duoneb scheduled, titrate frequency to effect, and albuterol as needed. - Changed symbicort to dulera based on pulmonology recommendations.  - Completed steroids 6/25. No wheezing. Note pt's allergy to prednisone, would recommend solumedrol or equivalent as this offered benefit as inpatient.   Iron-deficiency anemia: Workup reveals low iron saturation. - Iron supplement ordered - Defer further evaluation to outpatient setting in absence of active gross bleeding.   Discharge Instructions  Allergies as of 11/02/2016      Reactions   Codeine Nausea And Vomiting   Imdur [isosorbide Dinitrate]    headache   Prednisone Other (See Comments)   Reaction:Abnormal behavior; cannot take in pill form but CAN tolerate the injection      Medication List    STOP taking these medications   budesonide-formoterol 160-4.5 MCG/ACT inhaler Commonly known as:  SYMBICORT     TAKE these medications   albuterol 108 (90 Base) MCG/ACT inhaler Commonly known as:  PROVENTIL HFA;VENTOLIN HFA Inhale 2 puffs into the lungs every 6 (six) hours as needed. For wheezing/shortness of breath   ALPRAZolam 0.25 MG tablet Commonly known as:  XANAX Take 0.5 tablets (0.125 mg total) by mouth 3 (three) times daily as needed for anxiety.   amLODipine 2.5 MG tablet Commonly known as:  NORVASC Take 1 tablet (2.5 mg  total) by mouth daily.   atorvastatin 20 MG tablet Commonly known as:  LIPITOR Take 1 tablet (20 mg total) by mouth daily.   ferrous sulfate 325 (65 FE) MG tablet Take 1 tablet (325 mg total) by mouth 2 (two) times daily with a meal.   ipratropium-albuterol 0.5-2.5 (3) MG/3ML Soln Commonly known as:  DUONEB Take 3 mLs by nebulization 2 (two) times daily.   mometasone-formoterol 200-5 MCG/ACT Aero Commonly known as:  DULERA Inhale 2 puffs into the lungs 2 (two) times daily.   nitroGLYCERIN 0.4 MG SL tablet Commonly known as:  NITROSTAT ONE TABLET UNDER TONGUE AS NEEDED FOR CHEST PAIN EVERY 5 MINUTES FOR 3 DOSES       Contact information for follow-up providers    [name], MD Follow up.   Specialty:  Family Medicine Contact information: [address] [place] [address]        Tanda Rockers, MD. Call in 4 week(s).   Specialty:  Pulmonary Disease Contact information: [address] [address] [phone]        [name], MD Follow up.   Specialty:  Oncology Why:  Oncology to contact patient to schedule appointment. If no contact made, please call.  Contact information: [place] [address] [phone]            Contact information for after-discharge care    Destination    [facility] Follow up.   Specialty:  [place] information: [address] [place] [phone]  Allergies  Allergen Reactions  . Codeine Nausea And Vomiting  . Imdur [Isosorbide Dinitrate]     headache  . Prednisone Other (See Comments)    Reaction:Abnormal behavior; cannot take in pill form but CAN tolerate the injection    Consultations:  Pulmonology, Dr. Melvyn Novas > Dr. Lake Bells  Oncology, [name]  Palliative care team, Dr. Rowe Pavy  IR  Procedures/Studies: Dg Chest 2 View  Result Date: 10/29/2016 CLINICAL DATA:  Shortness  of breath EXAM: CHEST  2 VIEW COMPARISON:  CT chest 10/28/2016 FINDINGS: Large left upper lobe pulmonary mass. No other pulmonary masses identified. No pleural effusion or pneumothorax. Stable heart size. No acute osseous abnormality. IMPRESSION: Large left upper lobe pulmonary mass most consistent with primary lung malignancy. Electronically Signed   By: Kathreen Devoid   On: 10/29/2016 15:40   Dg Chest 2 View  Result Date: 10/26/2016 CLINICAL DATA:  Shortness of breath EXAM: CHEST  2 VIEW COMPARISON:  10/22/2016 FINDINGS: Collapse/ consolidation in the left upper lung persists in a similar fashion. Patient has known left upper lobe pulmonary mass and features remain compatible with postobstructive process. Right lung appears mildly hyperexpanded but clear. Nodular density projecting over the right lung base is compatible with nipple shadow. The cardiopericardial silhouette is within normal limits for size. Bones are diffusely demineralized. IMPRESSION: Stable exam. Volume loss and consolidation left upper lobe, likely post obstructive from patient's known left upper lobe lung mass. Electronically Signed   By: Misty Stanley M.D.   On: 10/26/2016 11:29   Dg Chest 2 View  Result Date: 10/22/2016 CLINICAL DATA:  Acute onset of shortness of breath and congestion. Initial encounter. EXAM: CHEST  2 VIEW COMPARISON:  Chest radiograph performed 01/19/2016, and PET/CT performed 02/28/2016 FINDINGS: There is new consolidation of the left upper lung zone, obscuring the previously noted mass. This likely reflects postobstructive pneumonia. There is no evidence of pleural effusion or pneumothorax. The heart is normal in size; the mediastinal contour is within normal limits. No acute osseous abnormalities are seen. IMPRESSION: New consolidation of the left upper lung zone, obscuring the previously noted mass. This likely reflects postobstructive pneumonia. Electronically Signed   By: Garald Balding M.D.   On: 10/22/2016  19:07   Dg Chest Fluoro  Result Date: 10/29/2016 CLINICAL DATA:  Large left upper lobe mass with mediastinal involvement. Clinical concern for phrenic nerve dysfunction. EXAM: CHEST FLUOROSCOPY TECHNIQUE: Real-time fluoroscopic evaluation of the chest was performed. FLUOROSCOPY TIME:  Fluoroscopy Time:  0 minutes and 12 seconds. Radiation Exposure Index (if provided by the fluoroscopic device): 10 mGy Number of Acquired Spot Images: 0 COMPARISON:  None. FINDINGS: With a sniff maneuver, there is appropriate and expected bilateral hemi diaphragmatic excursion. No evidence for paradoxical hemi diaphragmatic motion. IMPRESSION: Normal motion of each hemidiaphragm with sniff maneuver. Electronically Signed   By: Misty Stanley M.D.   On: 10/29/2016 16:41   Ct Chest W Contrast  Result Date: 10/28/2016 CLINICAL DATA:  LEFT upper lobe mass presumably bronchogenic carcinoma. PET-CT 02/28/2016. No interval intervention per patient request. EXAM: CT CHEST WITH CONTRAST TECHNIQUE: Multidetector CT imaging of the chest was performed during intravenous contrast administration. CONTRAST:  3mL ISOVUE-300 IOPAMIDOL (ISOVUE-300) INJECTION 61% COMPARISON:  PET-CT 02/28/2016 FINDINGS: Cardiovascular: Coronary artery calcification and aortic atherosclerotic calcification. Mediastinum/Nodes: No axillary or supraclavicular adenopathy. Interval enlargement AP window node measuring 22 mm increased from 12 mm. Lungs/Pleura: LEFT upper lobe suprahilar mass is increased in size measuring 9.3 x 6.3 cm increased from 5.6 x 5.7  cm. The mass surrounds the LEFT upper lobe bronchus with collapse of the LEFT upper lobe. Difficult to separate postobstructive collapse versus tumor. Favor majority of the consolidation in the LEFT upper lobe to represent tumor. No contralateral pulmonary metastasis.  Emphysema the upper lobes. Upper Abdomen: New large enhancing mass lesion in the spleen measuring 7 cm x 7.7 cm New metastatic adenopathy adjacent  to the pancreatic tail measuring 2.5 cm (image 145, series 2). Additional smaller nodal metastasis in the gastrosplenic ligament Cysts within the LEFT hepatic lobe is stable. Other small hypodensities unchanged. No adrenal mass identified. New lesion along the distal esophagus measuring 12 mm by 16 mm (image 117, series 2) is favored a metastatic lymph node partially compressing the esophagus Musculoskeletal: No aggressive osseous lesion. IMPRESSION: 1. 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 2. Marked increase in size of AP window metastatic lymph node. 3. New large 7 cm metastatic lesion to the spleen. 4. New metastatic adenopathy along the pancreatic tail, upper abdomen, and middle mediastinal adjacent to the distal esophagus. These results will be called to the ordering clinician or representative by the Radiologist Assistant, and communication documented in the PACS or zVision Dashboard. Electronically Signed   By: Suzy Bouchard M.D.   On: 10/28/2016 13:43   Nm Pulmonary Perf And Vent  Result Date: 10/29/2016 CLINICAL DATA:  Evaluate for pulmonary embolus. Shortness of breath. COPD. Left lung mass. EXAM: NUCLEAR MEDICINE VENTILATION - PERFUSION LUNG SCAN TECHNIQUE: Ventilation images were obtained in multiple projections using inhaled aerosol Tc-26m DTPA. Perfusion images were obtained in multiple projections after intravenous injection of Tc-24m MAA. RADIOPHARMACEUTICALS:  29.0 mCi Technetium-14m DTPA aerosol inhalation and 4.3 mCi Technetium-63m MAA IV COMPARISON:  CT 10/28/2016. Chest x-ray 10/26/2016. ET-CT 02/06/2016. FINDINGS: Patchy matched ventilation and perfusion defects are noted throughout both lungs with particularly poor ventilation perfusion of the left lung. Defect noted in the left  upper lung consistent with known mass. This scan is indeterminate for pulmonary embolus. IMPRESSION: Very poor ventilation perfusion throughout both lungs, particularly the left, with patchy multiple matched defects. Indeterminate scan for pulmonary embolus . Electronically Signed   By: Marcello Moores  Register   On: 10/29/2016 15:29   Ct Biopsy  Result Date: 11/01/2016 INDICATION: 71 year old female with a history of left upper lobe mass. EXAM: CT BIOPSY LUNG MEDICATIONS: None. ANESTHESIA/SEDATION: Moderate (conscious) sedation was employed during this procedure. A total of Versed 1.0 mg and Fentanyl 50 mcg was administered intravenously. Moderate Sedation Time: 17 minutes. The patient's level of consciousness and vital signs were monitored continuously by radiology nursing throughout the procedure under my direct supervision. FLUOROSCOPY TIME:  CT). COMPLICATIONS: None PROCEDURE: The procedure, risks, benefits, and alternatives were explained to the patient and the patient's family. Specific risks that were addressed included bleeding, infection, pneumothorax, need for further procedure including chest tube placement, chance of delayed pneumothorax or hemorrhage, hemoptysis, nondiagnostic sample, cardiopulmonary collapse, death. Questions regarding the procedure were encouraged and answered. The patient understands and consents to the procedure. Patient was positioned in the supine position on the CT gantry table and a scout CT of the chest was performed for planning purposes. Once angle of approach was determined, the skin and subcutaneous tissues this scan was prepped and draped in the usual sterile fashion, and a sterile drape was applied covering the operative field.  A sterile gown and sterile gloves were used for the procedure. Local anesthesia was provided with 1% Lidocaine. The skin and subcutaneous tissues were infiltrated 1% lidocaine for local anesthesia, and a small stab incision was made with an 11 blade  scalpel. Using CT guidance, a 17 gauge trocar needle was advanced into the left upper lobetarget. After confirmation of the tip, separate 18 gauge core biopsies were performed. These were placed into solution for transportation to the lab. Biosentry Device was deployed. A final CT image was performed. Patient tolerated the procedure well and remained hemodynamically stable throughout. No complications were encountered and no significant blood loss was encounter IMPRESSION: Status post CT-guided biopsy of left upper lobe mass. Tissue specimen sent to pathology for complete histopathologic analysis. Signed, Dulcy Fanny. Earleen Newport, DO [facility] Electronically Signed   By: Corrie Mckusick D.O.   On: 11/01/2016 11:40   Dg Chest Port 1 View  Result Date: 11/01/2016 CLINICAL DATA:  Follow-up post biopsy pneumothorax. EXAM: PORTABLE CHEST 1 VIEW COMPARISON:  CT scan of the chest of today's date FINDINGS: There is a large soft tissue mass in the right upper lobe. No definite pneumothorax is observed on this study. There is air within the soft tissue mass superiorly. There is compensatory hyperinflation of the right lung with mediastinal shift toward the left. There is no pleural effusion or hemothorax. The heart is normal in size and the pulmonary vascularity is not engorged. IMPRESSION: No definite residual pneumothorax. Some air within the soft tissue mass is faintly visible similar to that seen on the post biopsy CT images. Electronically Signed   By: David  Martinique M.D.   On: 11/01/2016 10:49   Subjective: Dyspnea stable, still limited to 2 steps to [facility]. No chest pain.   Discharge Exam: Vitals:   11/01/16 2101 11/02/16 0452  BP: 108/70 121/73  Pulse: 94 87  Resp: 16 16  Temp: 98.4 F (36.9 C) 98.5 F (36.9 C)   General: Frail female in no distress Cardiovascular: RRR, no murmur or JVD or edema Respiratory: Mildly labored on supplemental oxygen.  Decreased aeration throughout, worse on left. Dullness to percussion on left.  Abdominal: Soft, NT, ND, bowel sounds + Extremities: + clubbing without cyanosis  Labs: BNP  Recent Labs  10/22/16 1739  BNP 39.7   Basic Metabolic Panel:  Recent Labs Lab 10/29/16 0439 10/31/16 0326  NA 138 137  K 3.7 4.0  CL 104 104  CO2 25 26  GLUCOSE 106* 97  BUN 7 14  CREATININE 0.35* 0.45  CALCIUM 9.0 8.5*   CBC:  Recent Labs Lab 10/29/16 0439 10/31/16 0326 11/01/16 0348  WBC 9.5 9.3 9.1  NEUTROABS  --   --  6.4  HGB 12.0 11.5* 11.3*  HCT 37.4 36.3 35.7*  MCV 74.9* 75.3* 74.8*  PLT 234 236 238   Microbiology Recent Results (from the past 240 hour(s))  Respiratory Panel by PCR     Status: None   Collection Time: 10/23/16  1:49 PM  Result Value Ref Range Status   Adenovirus NOT DETECTED NOT DETECTED Final   Coronavirus 229E NOT DETECTED NOT DETECTED Final   Coronavirus HKU1 NOT DETECTED NOT DETECTED Final   Coronavirus NL63 NOT DETECTED NOT DETECTED Final   Coronavirus OC43 NOT DETECTED NOT DETECTED Final   Metapneumovirus NOT DETECTED NOT DETECTED Final   Rhinovirus / Enterovirus NOT DETECTED NOT DETECTED Final   Influenza A NOT DETECTED NOT DETECTED Final   Influenza  B NOT DETECTED NOT DETECTED Final   Parainfluenza Virus 1 NOT DETECTED NOT DETECTED Final   Parainfluenza Virus 2 NOT DETECTED NOT DETECTED Final   Parainfluenza Virus 3 NOT DETECTED NOT DETECTED Final   Parainfluenza Virus 4 NOT DETECTED NOT DETECTED Final   Respiratory Syncytial Virus NOT DETECTED NOT DETECTED Final   Bordetella pertussis NOT DETECTED NOT DETECTED Final   Chlamydophila pneumoniae NOT DETECTED NOT DETECTED Final   Mycoplasma pneumoniae NOT DETECTED NOT DETECTED Final    Comment: Performed at [facility], [place] [address] [place] [address]    Time coordinating discharge: Approximately 40 minutes  Vance Gather, MD  [facility] 11/02/2016, 12:16 PM Pager [phone]

## 2016-11-05 ENCOUNTER — Encounter: Payer: Self-pay | Admitting: *Deleted

## 2016-11-05 ENCOUNTER — Telehealth: Payer: Self-pay | Admitting: *Deleted

## 2016-11-05 NOTE — Progress Notes (Signed)
I updated [name] that I was unable to reach patient.  He states patient is at rehab facility.  I called [facility] and spoke with transportation team lead.  I gave them an appt for her to be seen 11/06/16 at 11:45 with arrival at 11:30.  I updated [name].  Also, per [name] he wanted me to send tissue for PDL 1 testing.  I will notify pathology dept.

## 2016-11-05 NOTE — Telephone Encounter (Signed)
Oncology Nurse Navigator Documentation  Oncology Nurse Navigator Flowsheets 11/05/2016  Navigator Location [facility]  Navigator Encounter Type Telephone/per [name] I called Ms. Tyler to set her up to see him.  I was unable to reach her but left vm message for her to call me.   Telephone Outgoing Call  Treatment Phase Pre-Tx/Tx Discussion  Barriers/Navigation Needs Coordination of Care  Interventions Coordination of Care  Coordination of Care Appts  Acuity Level 2  Time Spent with Patient 30

## 2016-11-06 ENCOUNTER — Ambulatory Visit
Admission: RE | Admit: 2016-11-06 | Discharge: 2016-11-06 | Disposition: A | Discharge status: Home / Self Care | Payer: Medicare Other | Source: Ambulatory Visit | Attending: Radiation Oncology | Admitting: Radiation Oncology

## 2016-11-06 ENCOUNTER — Ambulatory Visit (HOSPITAL_BASED_OUTPATIENT_CLINIC_OR_DEPARTMENT_OTHER): Payer: Medicare Other | Admitting: Nurse Practitioner

## 2016-11-06 ENCOUNTER — Ambulatory Visit: Admission: RE | Admit: 2016-11-06 | Payer: Medicare Other | Source: Ambulatory Visit

## 2016-11-06 VITALS — BP 103/87 | HR 104 | Temp 98.5°F | Resp 18 | Ht 63.0 in | Wt 109.4 lb

## 2016-11-06 DIAGNOSIS — J440 Chronic obstructive pulmonary disease with acute lower respiratory infection: Secondary | ICD-10-CM | POA: Diagnosis not present

## 2016-11-06 DIAGNOSIS — K219 Gastro-esophageal reflux disease without esophagitis: Secondary | ICD-10-CM | POA: Insufficient documentation

## 2016-11-06 DIAGNOSIS — Z801 Family history of malignant neoplasm of trachea, bronchus and lung: Secondary | ICD-10-CM | POA: Insufficient documentation

## 2016-11-06 DIAGNOSIS — I252 Old myocardial infarction: Secondary | ICD-10-CM | POA: Diagnosis not present

## 2016-11-06 DIAGNOSIS — J9600 Acute respiratory failure, unspecified whether with hypoxia or hypercapnia: Secondary | ICD-10-CM | POA: Diagnosis not present

## 2016-11-06 DIAGNOSIS — Z9981 Dependence on supplemental oxygen: Secondary | ICD-10-CM | POA: Insufficient documentation

## 2016-11-06 DIAGNOSIS — Z9889 Other specified postprocedural states: Secondary | ICD-10-CM | POA: Insufficient documentation

## 2016-11-06 DIAGNOSIS — C779 Secondary and unspecified malignant neoplasm of lymph node, unspecified: Secondary | ICD-10-CM | POA: Insufficient documentation

## 2016-11-06 DIAGNOSIS — Z510 Encounter for antineoplastic radiation therapy: Secondary | ICD-10-CM | POA: Diagnosis not present

## 2016-11-06 DIAGNOSIS — C7989 Secondary malignant neoplasm of other specified sites: Secondary | ICD-10-CM

## 2016-11-06 DIAGNOSIS — E785 Hyperlipidemia, unspecified: Secondary | ICD-10-CM | POA: Diagnosis not present

## 2016-11-06 DIAGNOSIS — Z87891 Personal history of nicotine dependence: Secondary | ICD-10-CM | POA: Diagnosis not present

## 2016-11-06 DIAGNOSIS — C3412 Malignant neoplasm of upper lobe, left bronchus or lung: Secondary | ICD-10-CM | POA: Diagnosis not present

## 2016-11-06 DIAGNOSIS — Z79899 Other long term (current) drug therapy: Secondary | ICD-10-CM | POA: Insufficient documentation

## 2016-11-06 DIAGNOSIS — Z888 Allergy status to other drugs, medicaments and biological substances status: Secondary | ICD-10-CM | POA: Diagnosis not present

## 2016-11-06 NOTE — Progress Notes (Addendum)
  [facility] OFFICE PROGRESS NOTE   Diagnosis:  Left lung mass  INTERVAL HISTORY:   Ms. Spadafora returns for her first office visit following hospital discharge. She continues to be short of breath. She currently utilizes supplemental oxygen at 3 L/m. She has an occasional cough. No fever. She remains weak. She is working with physical therapy. Her goal is to return home. Appetite varies.   Objective:  Vital signs in last 24 hours:  Blood pressure 103/87, pulse (!) 104, temperature 98.5 F (36.9 C), temperature source Oral, resp. rate 18, height '5\' 3"'$  (1.6 m), weight 109 lb 6.4 oz (49.6 kg), SpO2 97 %.    Resp: Distant breath sounds. No respiratory distress. Cardio: Regular rate and rhythm. GI: Abdomen soft and nontender. Vascular: No leg edema. Neuro: Alert and oriented.    Lab Results:  Lab Results  Component Value Date   WBC 9.1 11/01/2016   HGB 11.3 (L) 11/01/2016   HCT 35.7 (L) 11/01/2016   MCV 74.8 (L) 11/01/2016   PLT 238 11/01/2016   NEUTROABS 6.4 11/01/2016    Imaging:  No results found.  Medications: I have reviewed the patient's current medications.  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  PDL1 pending.  2. Acute respiratory failure secondary to #1  3. Oxygen dependent COPD  4. History of a NSTEMI 2015   Disposition: Ms. Leitz appears stable. 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 [name] reviewed the diagnosis, prognosis and treatment options. She understands  no therapy will be curative. [name] has spoken with Dr. Tammi Klippel. The plan initially is for radiation. We will see her back following completion of the radiation course and discuss chemotherapy. We began preliminary discussion regarding Taxol/carboplatin chemotherapy at today's visit.  Patient seen with [name]. 25 minutes were spent face-to-face at today's visit with the majority of that time involved in counseling/coordination of care.  [name] ANP/GNP-BC   11/06/2016  12:35 PM This was a shared visit with [name]. We reviewed the pathology findings and treatment options with Ms. Dimock. I discussed the case with Dr. Tammi Klippel. The plan is to complete a course of pelvic radiation to the left chest with the hope of improving the left chest aeration and diminishing the chance of left lower lobe collapse. She will return after the course of radiation to discuss systemic treatment options.  Julieanne Manson, M.D.

## 2016-11-06 NOTE — Progress Notes (Signed)
Radiation Oncology         [phone]) [phone] ________________________________  Initial outpatient Consultation  Name: Zacari Radick MRN: [id number]  Date: 11/06/2016  DOB: 01-01-1946  CC:[name], MD  [name], MD   REFERRING PHYSICIAN: [name], MD  DIAGNOSIS: Stage IV NSCLC, Squamous cell carcinoma of the left upper lobe.  HISTORY OF PRESENT ILLNESS: Rosilyn Coachman is a 71 y.o. female seen at the request of [name] for a probable advanced lung cancer. The patient was found to have a lung mass noted on imaging in the fall of 2017. She went for a PET scan on 03/02/2013 revealing hypermetabolic left upper lobe mass with hypermetabolic adjacent nodule and AP window node measuring 12 mm with an SUV of 6.7. She refused biopsy at that time, and was lost to follow up until she presented to the hospital on 10/22/2016 with shortness of breath. She was found to have a community-acquired pneumonia at the left upper lobe with significant replacement of the left upper lobe by tumor measuring 9.3 x 6.3 cm surrounding the left upper lobe bronchus and collapse of the left upper lobe. There were nes lesions in the spleen measuring 7 x 7.7 cm, and metastatic adenopathy about the pancreas, and progressive adenopathy in the AP window node to 17mm.  A biopsy of the left upper lobe mass was performed in the interventional radiology and is consistent with non-small cell carcinoma, squamous cell. PDL 1 testing is still pending at this time. She followed up today Dr. Gearldine Shown office, and recommendation has been made for systemic therapy following a palliative course of radiotherapy given her progressive symptoms. At discharge she was sent home on 3 L of oxygen by nasal cannula and of note has been on 2 L since 2014 for COPD.     PREVIOUS RADIATION THERAPY: No  PAST MEDICAL HISTORY:  Past Medical History:  Diagnosis Date  . Chronic respiratory failure (HCC)    a. on home O2.  Marland Kitchen  COPD (chronic obstructive pulmonary disease) ([place])    a. Home O2.  . Former tobacco use   . GERD (gastroesophageal reflux disease)   . Hyperlipidemia   . NSTEMI (non-ST elevated myocardial infarction) ([place])    a. 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 Coronary vasospasm      PAST SURGICAL HISTORY: Past Surgical History:  Procedure Laterality Date  . LEFT HEART CATHETERIZATION WITH CORONARY ANGIOGRAM N/A 06/08/2013   Procedure: LEFT HEART CATHETERIZATION WITH CORONARY ANGIOGRAM;  Surgeon: Sanda Klein, MD;  Location: [facility];  Service: Cardiovascular;  Laterality: N/A;    FAMILY HISTORY:  Family History  Problem Relation Age of Onset  . Lung cancer Father   . Heart attack Neg Hx   . Stroke Neg Hx   . Hypertension Neg Hx     SOCIAL HISTORY:  Social History   Social History  . Marital status: Divorced    Spouse name: N/A  . Number of children: N/A  . Years of education: N/A   Occupational History  . Not on file.   Social History Main Topics  . Smoking status: Former Smoker    Packs/day: 1.00    Years: 30.00    Types: Cigarettes    Quit date: 05/08/2007  . Smokeless tobacco: Never Used  . Alcohol use Yes     Comment: rarely  . Drug use: No  . Sexual activity: Not on file  Other Topics Concern  . Not on file   Social History Narrative  . No narrative on file  The patient is a retired Tourist information centre manager. She lives in [place]: Codeine; Imdur [isosorbide dinitrate]; and Prednisone  MEDICATIONS:  Current Outpatient Prescriptions  Medication Sig Dispense Refill  . albuterol (PROVENTIL HFA;VENTOLIN HFA) 108 (90 BASE) MCG/ACT inhaler Inhale 2 puffs into the lungs every 6 (six) hours as needed. For wheezing/shortness of breath    . ALPRAZolam (XANAX) 0.25 MG tablet Take 0.5 tablets (0.125 mg total) by mouth 3 (three) times daily as needed for anxiety. 5 tablet 0  . amLODipine (NORVASC) 2.5 MG  tablet Take 1 tablet (2.5 mg total) by mouth daily. 90 tablet 3  . atorvastatin (LIPITOR) 20 MG tablet Take 1 tablet (20 mg total) by mouth daily. 90 tablet 3  . ipratropium-albuterol (DUONEB) 0.5-2.5 (3) MG/3ML SOLN Take 3 mLs by nebulization 2 (two) times daily.    . mometasone-formoterol (DULERA) 200-5 MCG/ACT AERO Inhale 2 puffs into the lungs 2 (two) times daily.    . nitroGLYCERIN (NITROSTAT) 0.4 MG SL tablet ONE TABLET UNDER TONGUE AS NEEDED FOR CHEST PAIN EVERY 5 MINUTES FOR 3 DOSES (Patient not taking: Reported on 11/06/2016) 25 tablet 3   No current facility-administered medications for this encounter.     REVIEW OF SYSTEMS:  On review of systems, the patient reports that she is doing okay as long as she has 3L of O2. She denies any chest pain, shortness of breath at rest. She has a productive cough but denies hemoptysis. She denies fevers, chills, night sweats. She has lost about 20 pounds in the last 6 months unintentionally. She denies any bowel or bladder disturbances, and denies abdominal pain, nausea or vomiting. She denies any new musculoskeletal or joint aches or pains. A complete review of systems is obtained and is otherwise negative.    PHYSICAL EXAM:  Wt Readings from Last 3 Encounters:  11/06/16 109 lb 6.4 oz (49.6 kg)  10/22/16 118 lb (53.5 kg)  09/28/16 130 lb (59 kg)   Temp Readings from Last 3 Encounters:  11/06/16 98.5 F (36.9 C) (Oral)  11/02/16 98.5 F (36.9 C) (Oral)  06/09/13 98 F (36.7 C) (Oral)   BP Readings from Last 3 Encounters:  11/06/16 103/87  11/02/16 121/73  09/28/16 116/78   Pulse Readings from Last 3 Encounters:  11/06/16 (!) 104  11/02/16 87  09/28/16 88     In general this is a chronically ill caucasian female in no acute distress. She is alert and oriented x4 and appropriate throughout the examination. HEENT reveals that the patient is normocephalic, atraumatic. EOMs are intact. PERRLA. Skin is intact without any evidence of gross  lesions. Cardiovascular exam reveals a regular rate and rhythm, no clicks rubs or murmurs are auscultated. Chest is clear to auscultation in the right fields. No audible breath sounds are noted in the left upper lung fields. The lower lung field is clear on the left. Lymphatic assessment is performed and does not reveal any adenopathy in the cervical, supraclavicular, axillary, or inguinal chains. Abdomen has active bowel sounds in all quadrants and is intact. The abdomen is soft, non tender, non distended. Lower extremities are negative for pretibial pitting edema, deep calf tenderness, cyanosis or clubbing.   KPS = 50  100 - Normal; no complaints; no evidence of disease. 90   - Able to carry on normal activity; minor signs or symptoms of disease. 80   - Normal activity  with effort; some signs or symptoms of disease. 76   - Cares for self; unable to carry on normal activity or to do active work. 60   - Requires occasional assistance, but is able to care for most of his personal needs. 50   - Requires considerable assistance and frequent medical care. 84   - Disabled; requires special care and assistance. 40   - Severely disabled; hospital admission is indicated although death not imminent. 79   - Very sick; hospital admission necessary; active supportive treatment necessary. 10   - Moribund; fatal processes progressing rapidly. 0     - Dead  Karnofsky DA, Abelmann Anthony, Craver LS and Burchenal JH [phone]) The use of the nitrogen mustards in the palliative treatment of carcinoma: with particular reference to bronchogenic carcinoma Cancer 1 [id number]  LABORATORY DATA:  Lab Results  Component Value Date   WBC 9.1 11/01/2016   HGB 11.3 (L) 11/01/2016   HCT 35.7 (L) 11/01/2016   MCV 74.8 (L) 11/01/2016   PLT 238 11/01/2016   Lab Results  Component Value Date   NA 137 10/31/2016   K 4.0 10/31/2016   CL 104 10/31/2016   CO2 26 10/31/2016   Lab Results  Component Value Date   ALT 16 10/23/2016    AST 21 10/23/2016   ALKPHOS 95 10/23/2016   BILITOT 0.4 10/23/2016     RADIOGRAPHY: Dg Chest 2 View  Result Date: 10/29/2016 CLINICAL DATA:  Shortness of breath EXAM: CHEST  2 VIEW COMPARISON:  CT chest 10/28/2016 FINDINGS: Large left upper lobe pulmonary mass. No other pulmonary masses identified. No pleural effusion or pneumothorax. Stable heart size. No acute osseous abnormality. IMPRESSION: Large left upper lobe pulmonary mass most consistent with primary lung malignancy. Electronically Signed   By: Kathreen Devoid   On: 10/29/2016 15:40   Dg Chest 2 View  Result Date: 10/26/2016 CLINICAL DATA:  Shortness of breath EXAM: CHEST  2 VIEW COMPARISON:  10/22/2016 FINDINGS: Collapse/ consolidation in the left upper lung persists in a similar fashion. Patient has known left upper lobe pulmonary mass and features remain compatible with postobstructive process. Right lung appears mildly hyperexpanded but clear. Nodular density projecting over the right lung base is compatible with nipple shadow. The cardiopericardial silhouette is within normal limits for size. Bones are diffusely demineralized. IMPRESSION: Stable exam. Volume loss and consolidation left upper lobe, likely post obstructive from patient's known left upper lobe lung mass. Electronically Signed   By: Misty Stanley M.D.   On: 10/26/2016 11:29   Dg Chest 2 View  Result Date: 10/22/2016 CLINICAL DATA:  Acute onset of shortness of breath and congestion. Initial encounter. EXAM: CHEST  2 VIEW COMPARISON:  Chest radiograph performed 01/19/2016, and PET/CT performed 02/28/2016 FINDINGS: There is new consolidation of the left upper lung zone, obscuring the previously noted mass. This likely reflects postobstructive pneumonia. There is no evidence of pleural effusion or pneumothorax. The heart is normal in size; the mediastinal contour is within normal limits. No acute osseous abnormalities are seen. IMPRESSION: New consolidation of the left upper  lung zone, obscuring the previously noted mass. This likely reflects postobstructive pneumonia. Electronically Signed   By: Garald Balding M.D.   On: 10/22/2016 19:07   Dg Chest Fluoro  Result Date: 10/29/2016 CLINICAL DATA:  Large left upper lobe mass with mediastinal involvement. Clinical concern for phrenic nerve dysfunction. EXAM: CHEST FLUOROSCOPY TECHNIQUE: Real-time fluoroscopic evaluation of the chest was performed. FLUOROSCOPY TIME:  Fluoroscopy Time:  0  minutes and 12 seconds. Radiation Exposure Index (if provided by the fluoroscopic device): 10 mGy Number of Acquired Spot Images: 0 COMPARISON:  None. FINDINGS: With a sniff maneuver, there is appropriate and expected bilateral hemi diaphragmatic excursion. No evidence for paradoxical hemi diaphragmatic motion. IMPRESSION: Normal motion of each hemidiaphragm with sniff maneuver. Electronically Signed   By: Misty Stanley M.D.   On: 10/29/2016 16:41   Ct Chest W Contrast  Result Date: 10/28/2016 CLINICAL DATA:  LEFT upper lobe mass presumably bronchogenic carcinoma. PET-CT 02/28/2016. No interval intervention per patient request. EXAM: CT CHEST WITH CONTRAST TECHNIQUE: Multidetector CT imaging of the chest was performed during intravenous contrast administration. CONTRAST:  89mL ISOVUE-300 IOPAMIDOL (ISOVUE-300) INJECTION 61% COMPARISON:  PET-CT 02/28/2016 FINDINGS: Cardiovascular: Coronary artery calcification and aortic atherosclerotic calcification. Mediastinum/Nodes: No axillary or supraclavicular adenopathy. Interval enlargement AP window node measuring 22 mm increased from 12 mm. Lungs/Pleura: LEFT upper lobe suprahilar mass is increased in size measuring 9.3 x 6.3 cm increased from 5.6 x 5.7 cm. The mass surrounds the LEFT upper lobe bronchus with collapse of the LEFT upper lobe. Difficult to separate postobstructive collapse versus tumor. Favor majority of the consolidation in the LEFT upper lobe to represent tumor. No contralateral pulmonary  metastasis.  Emphysema the upper lobes. Upper Abdomen: New large enhancing mass lesion in the spleen measuring 7 cm x 7.7 cm New metastatic adenopathy adjacent to the pancreatic tail measuring 2.5 cm (image 145, series 2). Additional smaller nodal metastasis in the gastrosplenic ligament Cysts within the LEFT hepatic lobe is stable. Other small hypodensities unchanged. No adrenal mass identified. New lesion along the distal esophagus measuring 12 mm by 16 mm (image 117, series 2) is favored a metastatic lymph node partially compressing the esophagus Musculoskeletal: No aggressive osseous lesion. IMPRESSION: 1. 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 2. Marked increase in size of AP window metastatic lymph node. 3. New large 7 cm metastatic lesion to the spleen. 4. New metastatic adenopathy along the pancreatic tail, upper abdomen, and middle mediastinal adjacent to the distal esophagus. These results will be called to the ordering clinician or representative by the Radiologist Assistant, and communication documented in the PACS or zVision Dashboard. Electronically Signed   By: Suzy Bouchard M.D.   On: 10/28/2016 13:43   Nm Pulmonary Perf And Vent  Result Date: 10/29/2016 CLINICAL DATA:  Evaluate for pulmonary embolus. Shortness of breath. COPD. Left lung mass. EXAM: NUCLEAR MEDICINE VENTILATION - PERFUSION LUNG SCAN TECHNIQUE: Ventilation images were obtained in multiple projections using inhaled aerosol Tc-54m DTPA. Perfusion images were obtained in multiple projections after intravenous injection of Tc-63m MAA. RADIOPHARMACEUTICALS:  29.0 mCi Technetium-68m DTPA aerosol inhalation and 4.3 mCi Technetium-52m MAA IV COMPARISON:  CT 10/28/2016. Chest x-ray 10/26/2016. ET-CT 02/06/2016. FINDINGS: Patchy  matched ventilation and perfusion defects are noted throughout both lungs with particularly poor ventilation perfusion of the left lung. Defect noted in the left upper lung consistent with known mass. This scan is indeterminate for pulmonary embolus. IMPRESSION: Very poor ventilation perfusion throughout both lungs, particularly the left, with patchy multiple matched defects. Indeterminate scan for pulmonary embolus . Electronically Signed   By: Marcello Moores  Register   On: 10/29/2016 15:29   Ct Biopsy  Result Date: 11/01/2016 INDICATION: 71 year old female with a history of left upper lobe mass. EXAM: CT BIOPSY LUNG  MEDICATIONS: None. ANESTHESIA/SEDATION: Moderate (conscious) sedation was employed during this procedure. A total of Versed 1.0 mg and Fentanyl 50 mcg was administered intravenously. Moderate Sedation Time: 17 minutes. The patient's level of consciousness and vital signs were monitored continuously by radiology nursing throughout the procedure under my direct supervision. FLUOROSCOPY TIME:  CT). COMPLICATIONS: None PROCEDURE: The procedure, risks, benefits, and alternatives were explained to the patient and the patient's family. Specific risks that were addressed included bleeding, infection, pneumothorax, need for further procedure including chest tube placement, chance of delayed pneumothorax or hemorrhage, hemoptysis, nondiagnostic sample, cardiopulmonary collapse, death. Questions regarding the procedure were encouraged and answered. The patient understands and consents to the procedure. Patient was positioned in the supine position on the CT gantry table and a scout CT of the chest was performed for planning purposes. Once angle of approach was determined, the skin and subcutaneous tissues this scan was prepped and draped in the usual sterile fashion, and a sterile drape was applied covering the operative field. A sterile gown and sterile gloves were used for the procedure. Local anesthesia was  provided with 1% Lidocaine. The skin and subcutaneous tissues were infiltrated 1% lidocaine for local anesthesia, and a small stab incision was made with an 11 blade scalpel. Using CT guidance, a 17 gauge trocar needle was advanced into the left upper lobetarget. After confirmation of the tip, separate 18 gauge core biopsies were performed. These were placed into solution for transportation to the lab. Biosentry Device was deployed. A final CT image was performed. Patient tolerated the procedure well and remained hemodynamically stable throughout. No complications were encountered and no significant blood loss was encounter IMPRESSION: Status post CT-guided biopsy of left upper lobe mass. Tissue specimen sent to pathology for complete histopathologic analysis. Signed, Dulcy Fanny. Earleen Newport, DO [facility] Electronically Signed   By: Corrie Mckusick D.O.   On: 11/01/2016 11:40   Dg Chest Port 1 View  Result Date: 11/01/2016 CLINICAL DATA:  Follow-up post biopsy pneumothorax. EXAM: PORTABLE CHEST 1 VIEW COMPARISON:  CT scan of the chest of today's date FINDINGS: There is a large soft tissue mass in the right upper lobe. No definite pneumothorax is observed on this study. There is air within the soft tissue mass superiorly. There is compensatory hyperinflation of the right lung with mediastinal shift toward the left. There is no pleural effusion or hemothorax. The heart is normal in size and the pulmonary vascularity is not engorged. IMPRESSION: No definite residual pneumothorax. Some air within the soft tissue mass is faintly visible similar to that seen on the post biopsy CT images. Electronically Signed   By: David  Martinique M.D.   On: 11/01/2016 10:49      IMPRESSION/PLAN: 1. Stage IV NSCLC, Squamous cell carcinoma of the left upper lobe. Dr. Lisbeth Renshaw discusses the pathology findings and reviews the nature of advanced lung disease. Dr. Lisbeth Renshaw discusses the  utility of palliative radiotherapy to the chest to preserve lung function and keep her airway patent.  We discussed the risks, benefits, short, and long term effects of radiotherapy, and the patient is interested in proceeding. Dr. Lisbeth Renshaw discusses the delivery and logistics of radiotherapy and he recommends a course of 10 fractions. Written consent is obtained and placed in the chart, a copy was provided to the patient. She will proceed as well with staging brain MRI and will proceed today to simulation to begin treatment on 11/08/16.   The above documentation reflects my direct findings  during this shared patient visit. Please see the separate note by Dr. Lisbeth Renshaw on this date for the remainder of the patient's plan of care.     Carola Rhine, PAC

## 2016-11-06 NOTE — Progress Notes (Signed)
  Radiation Oncology         [phone] ________________________________  Name: Brooke [name] MRN: [id number]  Date: 11/06/2016  DOB: 08-20-1945  SIMULATION AND TREATMENT PLANNING NOTE  DIAGNOSIS:     ICD-10-CM   1. Malignant neoplasm of bronchus of left upper lobe ([place]) C34.12      Site:  Left lung  NARRATIVE:  The patient was brought to the [facility].  Identity was confirmed.  All relevant records and images related to the planned course of therapy were reviewed.   Written consent to proceed with treatment was confirmed which was freely given after reviewing the details related to the planned course of therapy had been reviewed with the patient.  Then, the patient was set-up in a stable reproducible  supine position for radiation therapy.  CT images were obtained.  Surface markings were placed.    Medically necessary device(s) for immobilization:  wingboard.   The CT images were loaded into the planning software.  Then the target and avoidance structures were contoured.  Treatment planning then occurred.  The radiation prescription was entered and confirmed.  A total of 3 complex treatment devices were fabricated which relate to the designed radiation treatment fields. Each of these customized fields/ complex treatment devices will be used on a daily basis during the radiation course. I have requested : 3D Simulation  I have requested a DVH of the following structures: Target volume, lungs, spinal cord.  The patient will undergo daily image guidance to ensure accurate localization of the target, and adequate minimize dose to the normal surrounding structures in close proximity to the target.    PLAN:  The patient will receive 30 Gy incan fractions.  ________________________________   Jodelle Gross, MD, PhD

## 2016-11-08 ENCOUNTER — Telehealth: Payer: Self-pay | Admitting: *Deleted

## 2016-11-08 ENCOUNTER — Ambulatory Visit
Admission: RE | Admit: 2016-11-08 | Discharge: 2016-11-08 | Disposition: A | Discharge status: Home / Self Care | Payer: Medicare Other | Source: Ambulatory Visit | Attending: Radiation Oncology | Admitting: Radiation Oncology

## 2016-11-08 DIAGNOSIS — Z510 Encounter for antineoplastic radiation therapy: Secondary | ICD-10-CM | POA: Diagnosis not present

## 2016-11-08 NOTE — Telephone Encounter (Signed)
-----   Message from Owens Shark, NP sent at 11/08/2016  8:44 AM EDT ----- Please let her know BP was running low at office visit, she should stop amlodipine

## 2016-11-08 NOTE — Telephone Encounter (Signed)
Left message for return call to discuss holding medication as directed below.

## 2016-11-09 ENCOUNTER — Ambulatory Visit
Admission: RE | Admit: 2016-11-09 | Discharge: 2016-11-09 | Disposition: A | Discharge status: Home / Self Care | Payer: Medicare Other | Source: Ambulatory Visit | Attending: Radiation Oncology | Admitting: Radiation Oncology

## 2016-11-09 DIAGNOSIS — Z510 Encounter for antineoplastic radiation therapy: Secondary | ICD-10-CM | POA: Diagnosis not present

## 2016-11-12 ENCOUNTER — Ambulatory Visit
Admission: RE | Admit: 2016-11-12 | Discharge: 2016-11-12 | Disposition: A | Discharge status: Home / Self Care | Payer: Medicare Other | Source: Ambulatory Visit | Attending: Radiation Oncology | Admitting: Radiation Oncology

## 2016-11-12 ENCOUNTER — Telehealth: Payer: Self-pay | Admitting: *Deleted

## 2016-11-12 DIAGNOSIS — Z510 Encounter for antineoplastic radiation therapy: Secondary | ICD-10-CM | POA: Diagnosis not present

## 2016-11-12 NOTE — Telephone Encounter (Signed)
Left message for return call- hold amlodipine due to low bp while in office.

## 2016-11-13 ENCOUNTER — Ambulatory Visit
Admission: RE | Admit: 2016-11-13 | Discharge: 2016-11-13 | Disposition: A | Discharge status: Home / Self Care | Payer: Medicare Other | Source: Ambulatory Visit | Attending: Radiation Oncology | Admitting: Radiation Oncology

## 2016-11-13 DIAGNOSIS — Z510 Encounter for antineoplastic radiation therapy: Secondary | ICD-10-CM | POA: Diagnosis not present

## 2016-11-14 ENCOUNTER — Encounter: Payer: Self-pay | Admitting: *Deleted

## 2016-11-14 ENCOUNTER — Ambulatory Visit
Admission: RE | Admit: 2016-11-14 | Discharge: 2016-11-14 | Disposition: A | Discharge status: Home / Self Care | Payer: Medicare Other | Source: Ambulatory Visit | Attending: Radiation Oncology | Admitting: Radiation Oncology

## 2016-11-14 DIAGNOSIS — Z510 Encounter for antineoplastic radiation therapy: Secondary | ICD-10-CM | POA: Diagnosis not present

## 2016-11-14 NOTE — Progress Notes (Signed)
I updated [name] regarding Ms. Heitz's schedule.  He would like to see her week of 12/03/16.  I notified scheduling.

## 2016-11-14 NOTE — Progress Notes (Signed)
Oncology Nurse Navigator Documentation  Oncology Nurse Navigator Flowsheets 11/14/2016  Navigator Location [facility]  Navigator Encounter Type Other/I followed up on Brooke [name]'s treatment plan.  She is to get XRT and then follow up with Med Onc. There is not an appt for [name] or [name] NP.  I notified scheduling to call and arrange an appt after XRT completed, week of 11/26/16  Treatment Phase Treatment  Barriers/Navigation Needs Coordination of Care  Interventions Coordination of Care  Coordination of Care Other  Acuity Level 2  Time Spent with Patient 15

## 2016-11-15 ENCOUNTER — Telehealth: Payer: Self-pay | Admitting: Nurse Practitioner

## 2016-11-15 ENCOUNTER — Ambulatory Visit
Admission: RE | Admit: 2016-11-15 | Discharge: 2016-11-15 | Disposition: A | Discharge status: Home / Self Care | Payer: Medicare Other | Source: Ambulatory Visit | Attending: Radiation Oncology | Admitting: Radiation Oncology

## 2016-11-15 DIAGNOSIS — Z510 Encounter for antineoplastic radiation therapy: Secondary | ICD-10-CM | POA: Diagnosis not present

## 2016-11-15 NOTE — Telephone Encounter (Signed)
Scheduled appt per 7/11 - left message with appt date and time and sent reminder letter in the mail.

## 2016-11-16 ENCOUNTER — Ambulatory Visit
Admission: RE | Admit: 2016-11-16 | Discharge: 2016-11-16 | Disposition: A | Discharge status: Home / Self Care | Payer: Medicare Other | Source: Ambulatory Visit | Attending: Radiation Oncology | Admitting: Radiation Oncology

## 2016-11-16 DIAGNOSIS — Z510 Encounter for antineoplastic radiation therapy: Secondary | ICD-10-CM | POA: Diagnosis not present

## 2016-11-19 ENCOUNTER — Ambulatory Visit
Admission: RE | Admit: 2016-11-19 | Discharge: 2016-11-19 | Disposition: A | Discharge status: Home / Self Care | Payer: Medicare Other | Source: Ambulatory Visit | Attending: Radiation Oncology | Admitting: Radiation Oncology

## 2016-11-19 DIAGNOSIS — Z510 Encounter for antineoplastic radiation therapy: Secondary | ICD-10-CM | POA: Diagnosis not present

## 2016-11-20 ENCOUNTER — Encounter (HOSPITAL_COMMUNITY): Payer: Self-pay

## 2016-11-20 ENCOUNTER — Ambulatory Visit
Admission: RE | Admit: 2016-11-20 | Discharge: 2016-11-20 | Disposition: A | Discharge status: Home / Self Care | Payer: Medicare Other | Source: Ambulatory Visit | Attending: Radiation Oncology | Admitting: Radiation Oncology

## 2016-11-20 DIAGNOSIS — Z510 Encounter for antineoplastic radiation therapy: Secondary | ICD-10-CM | POA: Diagnosis not present

## 2016-11-21 ENCOUNTER — Encounter: Payer: Self-pay | Admitting: *Deleted

## 2016-11-21 ENCOUNTER — Encounter: Payer: Self-pay | Admitting: Radiation Oncology

## 2016-11-21 ENCOUNTER — Ambulatory Visit
Admission: RE | Admit: 2016-11-21 | Discharge: 2016-11-21 | Disposition: A | Discharge status: Home / Self Care | Payer: Medicare Other | Source: Ambulatory Visit | Attending: Radiation Oncology | Admitting: Radiation Oncology

## 2016-11-21 DIAGNOSIS — Z510 Encounter for antineoplastic radiation therapy: Secondary | ICD-10-CM | POA: Diagnosis not present

## 2016-11-21 NOTE — Progress Notes (Signed)
Oncology Nurse Navigator Documentation  Oncology Nurse Navigator Flowsheets 11/21/2016  Navigator Location [facility]  Navigator Encounter Type Other/I followed up on Brooke [name]'s PDL 1 results.  I printed and placed on Dr. Gearldine Shown desk  Treatment Phase Treatment  Barriers/Navigation Needs Coordination of Care  Interventions Coordination of Care  Coordination of Care Other  Acuity Level 1  Time Spent with Patient 15

## 2016-11-28 NOTE — Progress Notes (Signed)
  Radiation Oncology         [phone]) [phone] ________________________________  Name: Brooke [name] MRN: [id number]  Date: 11/21/2016  DOB: 04/08/1946  End of Treatment Note  Diagnosis:   Stage IV NSCLC, Squamous cell carcinoma of the left upper lobe.     Indication for treatment:  palliative       Radiation treatment dates:   11/08/2016 to 11/21/2016  Site/dose:   The Left lung was treated to 30 Gy in 10 fractions at 3 Gy per fraction.   Beams/energy:   3D // 10X, 6X  Narrative: The patient tolerated radiation treatment relatively well.   Patient states her breathing has improved with radiation treatment. She is still on oxygen therapy.   Plan: The patient has completed radiation treatment. The patient will return to radiation oncology clinic for routine followup in one month. I advised them to call or return sooner if they have any questions or concerns related to their recovery or treatment.  ------------------------------------------------  Jodelle Gross, MD, PhD  This document serves as a record of services personally performed by Kyung Rudd, MD. It was created on his behalf by Arlyce Harman, a trained medical scribe. The creation of this record is based on the scribe's personal observations and the provider's statements to them. This document has been checked and approved by the attending provider.

## 2016-12-05 ENCOUNTER — Encounter: Payer: Self-pay | Admitting: *Deleted

## 2016-12-05 ENCOUNTER — Ambulatory Visit (HOSPITAL_BASED_OUTPATIENT_CLINIC_OR_DEPARTMENT_OTHER): Payer: Medicare Other | Admitting: Nurse Practitioner

## 2016-12-05 ENCOUNTER — Telehealth: Payer: Self-pay | Admitting: Oncology

## 2016-12-05 ENCOUNTER — Other Ambulatory Visit: Payer: Medicare Other

## 2016-12-05 VITALS — BP 120/69 | HR 82 | Temp 98.0°F | Resp 18 | Ht 63.0 in | Wt 110.3 lb

## 2016-12-05 DIAGNOSIS — J9600 Acute respiratory failure, unspecified whether with hypoxia or hypercapnia: Secondary | ICD-10-CM | POA: Diagnosis not present

## 2016-12-05 DIAGNOSIS — Z7189 Other specified counseling: Secondary | ICD-10-CM | POA: Insufficient documentation

## 2016-12-05 DIAGNOSIS — C3412 Malignant neoplasm of upper lobe, left bronchus or lung: Secondary | ICD-10-CM

## 2016-12-05 NOTE — Progress Notes (Signed)
START OFF PATHWAY REGIMEN - Non-Small Cell Lung   [id number]:Pembrolizumab 200 mg q21 Days:   A cycle is 21 days:     Pembrolizumab   **Always confirm dose/schedule in your pharmacy ordering system**    Patient Characteristics: Stage IV Metastatic, Squamous, PS = 2, First Line AJCC T Category: Staged < 8th Ed. Current Disease Status: Distant Metastases AJCC N Category: Staged < 8th Ed. AJCC M Category: Staged < 8th Ed. AJCC 8 Stage Grouping: Staged < 8th Ed. Histology: Squamous Cell Line of therapy: First Line PD-L1 Expression Status: PD-L1 Positive >= 50% (TPS) Performance Status: PS = 2 Would you be surprised if this patient died  in the next year? I would NOT be surprised if this patient died in the next year Intent of Therapy: Non-Curative / Palliative Intent, Discussed with Patient

## 2016-12-05 NOTE — Telephone Encounter (Signed)
Gave patient avs report or appointments for August. [facility] radiology will call re scan.

## 2016-12-05 NOTE — Progress Notes (Addendum)
  [place] OFFICE PROGRESS NOTE   Diagnosis:  Non-small cell lung cancer  INTERVAL HISTORY:   Brooke [name] returns as scheduled. She completed the course of radiation 11/21/2016. She overall is feeling better. Dyspnea has improved. She is currently utilizing supplemental oxygen at 2 L/m. She reports she has had no wheezing. She is working with physical therapy. Her energy level has improved. She reports a good appetite.  Objective:  Vital signs in last 24 hours:  Blood pressure 120/69, pulse 82, temperature 98 F (36.7 C), temperature source Oral, resp. rate 18, height _0  (1.6 m), weight 110 lb 4.8 oz (50 kg), SpO2 100 %.    HEENT: No thrush or ulcers. Resp: Distant breath sounds. No wheezes. Cardio: Regular rate and rhythm. GI: Abdomen soft and nontender. No hepatosplenomegaly. Vascular: No leg edema.    Lab Results:  Lab Results  Component Value Date   WBC 9.1 11/01/2016   HGB 11.3 (L) 11/01/2016   HCT 35.7 (L) 11/01/2016   MCV 74.8 (L) 11/01/2016   PLT 238 11/01/2016   NEUTROABS 6.4 11/01/2016    Imaging:  No results found.  Medications: I have reviewed the patient's current medications.  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  PDL1 60%  Left lung radiation 11/08/2016 through 11/21/2016  2. Acute respiratory failure secondary to #1  3. Oxygen dependent COPD  4. History of a NSTEMI 2015   Disposition: Brooke [name] appears stable. She has completed the course of left lung radiation. PDL 1 testing returned at 60%.  We discussed immunotherapy with Pembrolizumab and reviewed potential toxicities including endocrinopathies, pneumonitis, hepatitis, dermatologic toxicity, colitis. She is agreeable to proceed. We will confirm EGFR and ALK testing has been completed. She will attend a chemotherapy education class. She will undergo a restaging chest CT 12/13/2016 with plans to proceed with cycle 1 Pembrolizumab that day as well. She will return for a follow-up visit and cycle 2 on 01/03/2017. She will contact the office in the interim with any problems.  Patient seen with [name]. 25 minutes were spent face-to-face at today's visit with the majority of that time involved and counseling/correlation of care.    [name] ANP/GNP-BC   12/05/2016  9:42 AM This was a shared visit with [name]. Brooke [name] was interviewed and examined. She completed palliative radiation. Her respiratory status appears improved. The tumor returned with high-level PD1 expression. I recommend treatment with pembrolizumab. We reviewed the potential toxicities associated with this agent. She agrees to proceed.  Julieanne Manson, M.D.

## 2016-12-06 ENCOUNTER — Ambulatory Visit (INDEPENDENT_AMBULATORY_CARE_PROVIDER_SITE_OTHER): Payer: Medicare Other | Admitting: Internal Medicine

## 2016-12-06 ENCOUNTER — Encounter: Payer: Self-pay | Admitting: Internal Medicine

## 2016-12-06 VITALS — BP 124/74 | HR 85 | Ht 62.5 in | Wt 110.0 lb

## 2016-12-06 DIAGNOSIS — R918 Other nonspecific abnormal finding of lung field: Secondary | ICD-10-CM | POA: Diagnosis not present

## 2016-12-06 DIAGNOSIS — J9611 Chronic respiratory failure with hypoxia: Secondary | ICD-10-CM | POA: Diagnosis not present

## 2016-12-06 DIAGNOSIS — J449 Chronic obstructive pulmonary disease, unspecified: Secondary | ICD-10-CM | POA: Diagnosis not present

## 2016-12-06 NOTE — Progress Notes (Signed)
Subjective:     Patient ID: Brooke Brooke [name], female   DOB: 01/04/1946,    MRN: [id number]     Brief patient profile:  76 Brooke [name] quit smoking 2009 on 02 noct 2012 maint on symbicort 160 since aorund  2014 referred to pulmonary clinic 02/15/2016 by Dr   Kathryne Eriksson for RUL Lung mass   History of Present Illness  02/15/2016 1st  Pulmonary office visit/ Brooke Brooke [name]  GOLD IV copd/ symb 160 2bid maint  Chief Complaint  Patient presents with  . Pulmonary Consult    referred by Dr. Kathryne Eriksson for COPD.  MMRC2 = can't walk a nl pace on a flat grade s sob but does fine slow and flat eg [employer]/ [employer] on 2lpm  New onset bloody mucus plugs November 11 2015 assoc with wt loss  02 2lpm hs and with walking / rare saba needed rec For cough > mucinex up to 1200 mg every 12 hours as needed Rec:  Fob but refused      11/01/16   CT directed bx  Pos NSC LUL  > RT palliative 11/21/16     12/06/2016  f/u ov/Jaris Kohles re: post hosp f/u -  Now on dulera 200 bid and duoneb Chief Complaint  Patient presents with  . HFU    Breathing is doing well. She is using Duoneb 2 x daily and uses albuterol inhaler 1-2 x per wk on average.   presently in SNF ever since last admit 6/18-29/18 for  Active Problems:   COPD  GOLD IV    Mass of upper lobe of left lung   Chronic respiratory failure with hypoxia (HCC)   COPD exacerbation (HCC)   Hypokalemia   Microcytic anemia   SOB (shortness of breath)   Protein-calorie malnutrition, severe Doe = 2lpm x 154ft with rolling walking  Sleeping ok at < 30 degrees    Doe = 2lpm x 17ft with rolling walker/2lpm Sleeping ok at < 30 degrees   No obvious day to day or daytime variability or assoc excess/ purulent sputum or mucus plugs or hemoptysis or cp or chest tightness, subjective wheeze or overt sinus or hb symptoms. No unusual exp hx or h/o childhood pna/ asthma or knowledge of premature birth.  Sleeping ok without nocturnal  or early am exacerbation  of respiratory   c/o's or need for noct saba. Also denies any obvious fluctuation of symptoms with weather or environmental changes or other aggravating or alleviating factors except as outlined above   Current Medications, Allergies, Complete Past Medical History, Past Surgical History, Family History, and Social History were reviewed in [employer] record.  ROS  The following are not active complaints unless bolded sore throat, dysphagia, dental problems, itching, sneezing,  nasal congestion or excess/ purulent secretions, ear ache,   fever, chills, sweats, unintended wt loss, classically pleuritic or exertional cp,  orthopnea pnd or leg swelling, presyncope, palpitations, abdominal pain, anorexia, nausea, vomiting, diarrhea  or change in bowel or bladder habits, change in stools or urine, dysuria,hematuria,  rash, arthralgias, visual complaints, headache, numbness, weakness or ataxia or problems with walking or coordination,  change in mood/affect or memory.                 Objective:   Physical Exam    amb pleasant wf nad   12/06/2016        110   02/15/16 119 lb (54 kg)  08/18/15 137 lb 12.8 oz (62.5 kg)  02/17/15  138 lb 1.9 oz (62.7 kg)    Vital signs reviewed - Note on arrival 02 sats  98% on  2lpm       HEENT: nl dentition, turbinates, and oropharynx. Nl external ear canals without cough reflex   NECK :  without JVD/Nodes/TM/ nl carotid upstrokes bilaterally   LUNGS: no acc muscle use,   Barrel chest/ distant bs s localized wheeze/ rhonchi   CV:  RRR  no s3 or murmur or increase in P2, no edema   ABD:  soft and nontender with pos Hoover's early to mid inspiration in the supine position. No bruits or organomegaly, bowel sounds nl  MS:  Nl gait/ ext warm without deformities, calf tenderness, cyanosis or clubbing No obvious joint restrictions   SKIN: warm and dry without lesions    NEURO:  alert, approp, nl sensorium with  no motor deficits      I personally  reviewed images and agree with radiology impression as follows:  CXR:   10/29/16  Large left upper lobe pulmonary mass most consistent with primary lung malignancy.    Assessment:

## 2016-12-06 NOTE — Patient Instructions (Addendum)
02 can be adjusted down and off for saturations above 90%    When you know you're going home, make appt to see me or Tammy NP with all medications/ inhalers in hand first available appt - if not able to go home return in 3 months to see me

## 2016-12-07 ENCOUNTER — Encounter: Payer: Self-pay | Admitting: *Deleted

## 2016-12-07 ENCOUNTER — Other Ambulatory Visit: Payer: Medicare Other

## 2016-12-09 ENCOUNTER — Encounter: Payer: Self-pay | Admitting: Internal Medicine

## 2016-12-09 ENCOUNTER — Other Ambulatory Visit: Payer: Self-pay | Admitting: Oncology

## 2016-12-09 NOTE — Assessment & Plan Note (Signed)
See CT Chest 01/26/16 c/w ? Stage  lung ca arising in LUL - PET 02/28/16 1. Large lobular hypermetabolic mass in the LEFT upper lobe abutting the oblique fissure consists with primary bronchogenic carcinoma. 2. Evidence of ipsilateral hypermetabolic mediastinal nodal Metastasis only > 02/29/2016  referred for EBUS/ Dr [place] Bells > declined  -11/01/16   CT directed bx  Pos [place] LUL  > RT palliative 11/21/16   Now on SNF and f/u with RT/oncology

## 2016-12-09 NOTE — Assessment & Plan Note (Signed)
rx 2lpm hs and prn daytime since 2014   Adequate control on present rx, reviewed in detail with pt > no change in rx needed

## 2016-12-09 NOTE — Assessment & Plan Note (Signed)
Spirometry 02/15/2016  FEV1 0.60 (28%)  Ratio 38 with classic curvature  p am symbicort 160   Relatively well compensated on dulera/duonebs > will need to re-establish outpt med reconciliation if d/c from SNF but nothing to add to excellent care at present  I had an extended discussion with the patient reviewing all relevant studies completed to date including extensive inpt and outpt records and  lasting 15 to 20 minutes of a 25 minute visit

## 2016-12-10 ENCOUNTER — Telehealth: Payer: Self-pay | Admitting: *Deleted

## 2016-12-10 NOTE — Telephone Encounter (Signed)
Oncology Nurse Navigator Documentation  Oncology Nurse Navigator Flowsheets 12/10/2016  Navigator Location [facility]  Navigator Encounter Type Telephone/I received a call from Ms. Cantera.  She states she has not gotten her CT scan yet.  I looked and scan is not authorized.  I notified Darlena to help expedite authorization.  I gave patient central scheduling's phone number to call for appt   Telephone Outgoing Call  Treatment Phase Treatment  Barriers/Navigation Needs Coordination of Care;Education  Education Other  Interventions Coordination of Care;Education  Coordination of Care Other  Education Method Verbal  Acuity Level 2  Acuity Level 2 Other  Time Spent with Patient 30

## 2016-12-12 ENCOUNTER — Telehealth: Payer: Self-pay

## 2016-12-12 ENCOUNTER — Encounter: Payer: Self-pay | Admitting: Oncology

## 2016-12-12 ENCOUNTER — Encounter (HOSPITAL_COMMUNITY): Payer: Self-pay

## 2016-12-12 ENCOUNTER — Ambulatory Visit (HOSPITAL_COMMUNITY)
Admission: RE | Admit: 2016-12-12 | Discharge: 2016-12-12 | Disposition: A | Discharge status: Home / Self Care | Payer: Medicare Other | Source: Ambulatory Visit | Attending: Nurse Practitioner | Admitting: Nurse Practitioner

## 2016-12-12 DIAGNOSIS — R918 Other nonspecific abnormal finding of lung field: Secondary | ICD-10-CM | POA: Diagnosis not present

## 2016-12-12 DIAGNOSIS — J439 Emphysema, unspecified: Secondary | ICD-10-CM | POA: Diagnosis not present

## 2016-12-12 DIAGNOSIS — C3412 Malignant neoplasm of upper lobe, left bronchus or lung: Secondary | ICD-10-CM | POA: Diagnosis present

## 2016-12-12 DIAGNOSIS — C7889 Secondary malignant neoplasm of other digestive organs: Secondary | ICD-10-CM | POA: Diagnosis not present

## 2016-12-12 DIAGNOSIS — I700 Atherosclerosis of aorta: Secondary | ICD-10-CM | POA: Insufficient documentation

## 2016-12-12 DIAGNOSIS — C772 Secondary and unspecified malignant neoplasm of intra-abdominal lymph nodes: Secondary | ICD-10-CM | POA: Insufficient documentation

## 2016-12-12 MED ORDER — IOPAMIDOL (ISOVUE-300) INJECTION 61%
INTRAVENOUS | Status: AC
  Filled 2016-12-12: qty 75

## 2016-12-12 MED ORDER — IOPAMIDOL (ISOVUE-300) INJECTION 61%
75.0000 mL | Freq: Once | INTRAVENOUS | Status: AC | PRN
  Administered 2016-12-12: 75 mL via INTRAVENOUS

## 2016-12-12 NOTE — Progress Notes (Signed)
Called pt to introduce myself as her Arboriculturist and to discuss the [employer].  Requested she return my call.

## 2016-12-12 NOTE — Telephone Encounter (Signed)
Call report from [facility] radiology CT chest today impressions #1 and #4.

## 2016-12-13 ENCOUNTER — Other Ambulatory Visit: Payer: Self-pay | Admitting: Nurse Practitioner

## 2016-12-13 ENCOUNTER — Encounter: Payer: Self-pay | Admitting: Oncology

## 2016-12-13 ENCOUNTER — Telehealth: Payer: Self-pay | Admitting: *Deleted

## 2016-12-13 DIAGNOSIS — C3412 Malignant neoplasm of upper lobe, left bronchus or lung: Secondary | ICD-10-CM

## 2016-12-13 MED ORDER — LEVOFLOXACIN 500 MG PO TABS: 500.0000 mg | ORAL_TABLET | Freq: Every day | ORAL | 0 refills | Status: DC

## 2016-12-13 NOTE — Telephone Encounter (Signed)
-----   Message from Owens Shark, NP sent at 12/13/2016  9:52 AM EDT ----- Please let her know the CT scan is concerning for pneumonia. I am sending a prescription to her pharmacy for [place].

## 2016-12-13 NOTE — Telephone Encounter (Signed)
Telephone call to patient to advise below. Patient unable to come to the phone will return call later.   Telephone call returned to patient- Spoke directly to [facility] at [place] given for levaquin. Patient will begin today.   [facility] called and rx cancelled.

## 2016-12-13 NOTE — Telephone Encounter (Signed)
Oncology Nurse Navigator Documentation  Oncology Nurse Navigator Flowsheets 12/13/2016  Navigator Location [facility]  Navigator Encounter Type Telephone/I received a call from Ms. Nason.  She had gotten a call from [place].  I contacted [name] and got an update on why they called. Per [name], I called patient back and updated her on scan and new medication ordered.  She would like to speak to [facility] or nurse.  I updated nurse.   Telephone Outgoing Call;Incoming Call  Treatment Phase Treatment  Barriers/Navigation Needs Education  Education Other  Interventions Education  Education Method Verbal  Acuity Level 2  Time Spent with Patient 30

## 2016-12-13 NOTE — Telephone Encounter (Signed)
Message from pt asking if she should begin treatment while on antibiotic.  Reviewed with [name]: Yes, begin treatment as scheduled. Returned call to pt, she voiced understanding.

## 2016-12-13 NOTE — Progress Notes (Signed)
Pt returned my call so I informed her unfortunately there aren't any foundations offering copay assistance for her Dx and the type of ins she has but offered the [place] and went over what it covers.  Pt would like to apply so she will bring her proof of income on 12/14/16.  If she is approved I will give her an expense sheet.

## 2016-12-14 ENCOUNTER — Encounter: Payer: Self-pay | Admitting: Oncology

## 2016-12-14 ENCOUNTER — Ambulatory Visit (HOSPITAL_BASED_OUTPATIENT_CLINIC_OR_DEPARTMENT_OTHER): Payer: Medicare Other

## 2016-12-14 ENCOUNTER — Other Ambulatory Visit (HOSPITAL_BASED_OUTPATIENT_CLINIC_OR_DEPARTMENT_OTHER): Payer: Medicare Other

## 2016-12-14 VITALS — BP 109/45 | HR 88 | Temp 98.1°F | Resp 17 | Wt 110.0 lb

## 2016-12-14 DIAGNOSIS — C3412 Malignant neoplasm of upper lobe, left bronchus or lung: Secondary | ICD-10-CM

## 2016-12-14 DIAGNOSIS — Z5112 Encounter for antineoplastic immunotherapy: Secondary | ICD-10-CM | POA: Diagnosis not present

## 2016-12-14 DIAGNOSIS — Z79899 Other long term (current) drug therapy: Secondary | ICD-10-CM | POA: Diagnosis not present

## 2016-12-14 LAB — COMPREHENSIVE METABOLIC PANEL
ALBUMIN: 3 g/dL — AB (ref 3.5–5.0)
ALT: 17 U/L (ref 0–55)
AST: 17 U/L (ref 5–34)
Alkaline Phosphatase: 88 U/L (ref 40–150)
Anion Gap: 9 mEq/L (ref 3–11)
BUN: 8.6 mg/dL (ref 7.0–26.0)
CALCIUM: 10.1 mg/dL (ref 8.4–10.4)
CO2: 27 mEq/L (ref 22–29)
Chloride: 103 mEq/L (ref 98–109)
Creatinine: 0.5 mg/dL — ABNORMAL LOW (ref 0.6–1.1)
Glucose: 89 mg/dl (ref 70–140)
POTASSIUM: 4.1 meq/L (ref 3.5–5.1)
SODIUM: 139 meq/L (ref 136–145)
Total Bilirubin: 0.31 mg/dL (ref 0.20–1.20)
Total Protein: 6.9 g/dL (ref 6.4–8.3)

## 2016-12-14 LAB — CBC WITH DIFFERENTIAL/PLATELET
BASO%: 0.4 % (ref 0.0–2.0)
Basophils Absolute: 0 10*3/uL (ref 0.0–0.1)
EOS%: 7.5 % — AB (ref 0.0–7.0)
Eosinophils Absolute: 0.5 10*3/uL (ref 0.0–0.5)
HCT: 33 % — ABNORMAL LOW (ref 34.8–46.6)
HGB: 10.6 g/dL — ABNORMAL LOW (ref 11.6–15.9)
LYMPH#: 0.8 10*3/uL — AB (ref 0.9–3.3)
LYMPH%: 12.7 % — AB (ref 14.0–49.7)
MCH: 24.8 pg — ABNORMAL LOW (ref 25.1–34.0)
MCHC: 32.2 g/dL (ref 31.5–36.0)
MCV: 77.1 fL — ABNORMAL LOW (ref 79.5–101.0)
MONO#: 0.5 10*3/uL (ref 0.1–0.9)
MONO%: 8 % (ref 0.0–14.0)
NEUT%: 71.4 % (ref 38.4–76.8)
NEUTROS ABS: 4.5 10*3/uL (ref 1.5–6.5)
PLATELETS: 331 10*3/uL (ref 145–400)
RBC: 4.28 10*6/uL (ref 3.70–5.45)
RDW: 21.8 % — ABNORMAL HIGH (ref 11.2–14.5)
WBC: 6.4 10*3/uL (ref 3.9–10.3)

## 2016-12-14 LAB — TSH: TSH: 0.469 m[IU]/L (ref 0.308–3.960)

## 2016-12-14 MED ORDER — SODIUM CHLORIDE 0.9 % IV SOLN
200.0000 mg | Freq: Once | INTRAVENOUS | Status: AC
  Administered 2016-12-14: 200 mg via INTRAVENOUS
  Filled 2016-12-14: qty 8

## 2016-12-14 MED ORDER — SODIUM CHLORIDE 0.9 % IV SOLN
Freq: Once | INTRAVENOUS | Status: AC
  Administered 2016-12-14: 13:00:00 via INTRAVENOUS

## 2016-12-14 NOTE — Progress Notes (Signed)
Pt is approved for the $400 [facility] grant.  °

## 2016-12-14 NOTE — Patient Instructions (Signed)
[facility] Discharge Instructions for Patients Receiving Chemotherapy  Today you received the following chemotherapy agents Keytruda   To help prevent nausea and vomiting after your treatment, we encourage you to take your nausea medication as directed.  If you develop nausea and vomiting that is not controlled by your nausea medication, call the clinic.   BELOW ARE SYMPTOMS THAT SHOULD BE REPORTED IMMEDIATELY:  *FEVER GREATER THAN 100.5 F  *CHILLS WITH OR WITHOUT FEVER  NAUSEA AND VOMITING THAT IS NOT CONTROLLED WITH YOUR NAUSEA MEDICATION  *UNUSUAL SHORTNESS OF BREATH  *UNUSUAL BRUISING OR BLEEDING  TENDERNESS IN MOUTH AND THROAT WITH OR WITHOUT PRESENCE OF ULCERS  *URINARY PROBLEMS  *BOWEL PROBLEMS  UNUSUAL RASH Items with * indicate a potential emergency and should be followed up as soon as possible.  Feel free to call the clinic you have any questions or concerns. The clinic phone number is [phone].  Please show the [place] at check-in to the Emergency Department and triage nurse.   Pembrolizumab injection [name] What is this medicine? PEMBROLIZUMAB (pem broe liz ue mab) is a monoclonal antibody. It is used to treat melanoma, head and neck cancer, Hodgkin lymphoma, non-small cell lung cancer, urothelial cancer, stomach cancer, and cancers that have a certain genetic condition. This medicine may be used for other purposes; ask your health care provider or pharmacist if you have questions. COMMON BRAND NAME(S): Keytruda What should I tell my health care provider before I take this medicine? They need to know if you have any of these conditions: -diabetes -immune system problems -inflammatory bowel disease -liver disease -lung or breathing disease -lupus -organ transplant -an unusual or allergic reaction to pembrolizumab, other medicines, foods, dyes, or preservatives -pregnant or trying to get pregnant -breast-feeding How  should I use this medicine? This medicine is for infusion into a vein. It is given by a health care professional in a hospital or clinic setting. A special MedGuide will be given to you before each treatment. Be sure to read this information carefully each time. Talk to your pediatrician regarding the use of this medicine in children. While this drug may be prescribed for selected conditions, precautions do apply. Overdosage: If you think you have taken too much of this medicine contact a poison control center or emergency room at once. NOTE: This medicine is only for you. Do not share this medicine with others. What if I miss a dose? It is important not to miss your dose. Call your doctor or health care professional if you are unable to keep an appointment. What may interact with this medicine? Interactions have not been studied. Give your health care provider a list of all the medicines, herbs, non-prescription drugs, or dietary supplements you use. Also tell them if you smoke, drink alcohol, or use illegal drugs. Some items may interact with your medicine. This list may not describe all possible interactions. Give your health care provider a list of all the medicines, herbs, non-prescription drugs, or dietary supplements you use. Also tell them if you smoke, drink alcohol, or use illegal drugs. Some items may interact with your medicine. What should I watch for while using this medicine? Your condition will be monitored carefully while you are receiving this medicine. You may need blood work done while you are taking this medicine. Do not become pregnant while taking this medicine or for 4 months after stopping it. Women should inform their doctor if they wish to become pregnant or think they  might be pregnant. There is a potential for serious side effects to an unborn child. Talk to your health care professional or pharmacist for more information. Do not breast-feed an infant while taking this  medicine or for 4 months after the last dose. What side effects may I notice from receiving this medicine? Side effects that you should report to your doctor or health care professional as soon as possible: -allergic reactions like skin rash, itching or hives, swelling of the face, lips, or tongue -bloody or black, tarry -breathing problems -changes in vision -chest pain -chills -constipation -cough -dizziness or feeling faint or lightheaded -fast or irregular heartbeat -fever -flushing -hair loss -low blood counts - this medicine may decrease the number of white blood cells, red blood cells and platelets. You may be at increased risk for infections and bleeding. -muscle pain -muscle weakness -persistent headache -signs and symptoms of high blood sugar such as dizziness; dry mouth; dry skin; fruity breath; nausea; stomach pain; increased hunger or thirst; increased urination -signs and symptoms of kidney injury like trouble passing urine or change in the amount of urine -signs and symptoms of liver injury like dark urine, light-colored stools, loss of appetite, nausea, right upper belly pain, yellowing of the eyes or skin -stomach pain -sweating -weight loss Side effects that usually do not require medical attention (report to your doctor or health care professional if they continue or are bothersome): -decreased appetite -diarrhea -tiredness This list may not describe all possible side effects. Call your doctor for medical advice about side effects. You may report side effects to FDA at 1-800-FDA-1088. Where should I keep my medicine? This drug is given in a hospital or clinic and will not be stored at home. NOTE: This sheet is a summary. It may not cover all possible information. If you have questions about this medicine, talk to your doctor, pharmacist, or health care provider.  2018 Elsevier/Gold Standard (2016-01-31 12:29:36)

## 2016-12-18 ENCOUNTER — Telehealth: Payer: Self-pay | Admitting: Nutrition

## 2016-12-18 NOTE — Telephone Encounter (Signed)
Patient contacted me with questions regarding nutrition and her recent [place] treatment. I called patient back.   Patient wondered about eating salad at her skilled nursing facility and all remembers being told not to eat salads.  I explained to patient that she could have salads as long as they were thoroughly washed and provided to her by her skilled nursing facility. Reviewed food safety guidelines. Patient was grateful for return phone call.

## 2016-12-18 NOTE — Telephone Encounter (Signed)
error 

## 2017-01-03 ENCOUNTER — Other Ambulatory Visit (HOSPITAL_BASED_OUTPATIENT_CLINIC_OR_DEPARTMENT_OTHER): Payer: Medicare Other

## 2017-01-03 ENCOUNTER — Ambulatory Visit (HOSPITAL_BASED_OUTPATIENT_CLINIC_OR_DEPARTMENT_OTHER): Payer: Medicare Other

## 2017-01-03 ENCOUNTER — Telehealth: Payer: Self-pay | Admitting: Oncology

## 2017-01-03 ENCOUNTER — Ambulatory Visit (HOSPITAL_BASED_OUTPATIENT_CLINIC_OR_DEPARTMENT_OTHER): Payer: Medicare Other | Admitting: Oncology

## 2017-01-03 VITALS — BP 117/60 | HR 81 | Temp 98.6°F | Resp 18 | Ht 62.5 in | Wt 113.2 lb

## 2017-01-03 DIAGNOSIS — C3412 Malignant neoplasm of upper lobe, left bronchus or lung: Secondary | ICD-10-CM

## 2017-01-03 DIAGNOSIS — C7989 Secondary malignant neoplasm of other specified sites: Secondary | ICD-10-CM | POA: Diagnosis not present

## 2017-01-03 DIAGNOSIS — Z5112 Encounter for antineoplastic immunotherapy: Secondary | ICD-10-CM

## 2017-01-03 LAB — COMPREHENSIVE METABOLIC PANEL
ALBUMIN: 3.1 g/dL — AB (ref 3.5–5.0)
ALK PHOS: 88 U/L (ref 40–150)
ALT: 11 U/L (ref 0–55)
ANION GAP: 8 meq/L (ref 3–11)
AST: 15 U/L (ref 5–34)
BILIRUBIN TOTAL: 0.24 mg/dL (ref 0.20–1.20)
BUN: 11.1 mg/dL (ref 7.0–26.0)
CALCIUM: 9.8 mg/dL (ref 8.4–10.4)
CHLORIDE: 104 meq/L (ref 98–109)
CO2: 27 mEq/L (ref 22–29)
CREATININE: 0.5 mg/dL — AB (ref 0.6–1.1)
EGFR: >90 mL/min/{1.73_m2} (ref >90)
Glucose: 81 mg/dl (ref 70–140)
Potassium: 4.5 mEq/L (ref 3.5–5.1)
Sodium: 139 mEq/L (ref 136–145)
TOTAL PROTEIN: 7 g/dL (ref 6.4–8.3)

## 2017-01-03 LAB — CBC WITH DIFFERENTIAL/PLATELET
BASO%: 0.2 % (ref 0.0–2.0)
Basophils Absolute: 0 10*3/uL (ref 0.0–0.1)
EOS%: 2 % (ref 0.0–7.0)
Eosinophils Absolute: 0.1 10*3/uL (ref 0.0–0.5)
HEMATOCRIT: 32 % — AB (ref 34.8–46.6)
HEMOGLOBIN: 10.4 g/dL — AB (ref 11.6–15.9)
LYMPH#: 0.9 10*3/uL (ref 0.9–3.3)
LYMPH%: 15.3 % (ref 14.0–49.7)
MCH: 25.7 pg (ref 25.1–34.0)
MCHC: 32.6 g/dL (ref 31.5–36.0)
MCV: 78.8 fL — ABNORMAL LOW (ref 79.5–101.0)
MONO#: 0.7 10*3/uL (ref 0.1–0.9)
MONO%: 11.3 % (ref 0.0–14.0)
NEUT%: 71.2 % (ref 38.4–76.8)
NEUTROS ABS: 4.2 10*3/uL (ref 1.5–6.5)
PLATELETS: 289 10*3/uL (ref 145–400)
RBC: 4.07 10*6/uL (ref 3.70–5.45)
RDW: 22.2 % — AB (ref 11.2–14.5)
WBC: 5.9 10*3/uL (ref 3.9–10.3)

## 2017-01-03 MED ORDER — SODIUM CHLORIDE 0.9 % IV SOLN
Freq: Once | INTRAVENOUS | Status: AC
  Administered 2017-01-03: 12:00:00 via INTRAVENOUS

## 2017-01-03 MED ORDER — PEMBROLIZUMAB CHEMO INJECTION 100 MG/4ML
200.0000 mg | Freq: Once | INTRAVENOUS | Status: AC
  Administered 2017-01-03: 200 mg via INTRAVENOUS
  Filled 2017-01-03: qty 8

## 2017-01-03 NOTE — Patient Instructions (Signed)
[facility] Discharge Instructions for Patients Receiving Chemotherapy  Today you received the following chemotherapy agents: Keytruda   To help prevent nausea and vomiting after your treatment, we encourage you to take your nausea medication as directed    If you develop nausea and vomiting that is not controlled by your nausea medication, call the clinic.   BELOW ARE SYMPTOMS THAT SHOULD BE REPORTED IMMEDIATELY:  *FEVER GREATER THAN 100.5 F  *CHILLS WITH OR WITHOUT FEVER  NAUSEA AND VOMITING THAT IS NOT CONTROLLED WITH YOUR NAUSEA MEDICATION  *UNUSUAL SHORTNESS OF BREATH  *UNUSUAL BRUISING OR BLEEDING  TENDERNESS IN MOUTH AND THROAT WITH OR WITHOUT PRESENCE OF ULCERS  *URINARY PROBLEMS  *BOWEL PROBLEMS  UNUSUAL RASH Items with * indicate a potential emergency and should be followed up as soon as possible.  Feel free to call the clinic you have any questions or concerns. The clinic phone number is [phone].  Please show the CHEMO ALERT CARD at check-in to the Emergency Department and triage nurse.   

## 2017-01-03 NOTE — Telephone Encounter (Signed)
Gave patient avs and calendar with appts.  °

## 2017-01-03 NOTE — Progress Notes (Signed)
  [place] OFFICE PROGRESS NOTE   Diagnosis: Non-small cell lung cancer  INTERVAL HISTORY:   Brooke [name] returns as scheduled. She completed a first treatment with pembrolizumab on 12/14/2016. She tolerated the treatment well. No rash or diarrhea. Her dyspnea has improved. She continues physical therapy. She is now ambulating. She plans to return home next week.  Objective:  Vital signs in last 24 hours:  Blood pressure 117/60, pulse 81, temperature 98.6 F (37 C), temperature source Oral, resp. rate 18, height 5' 2.5" (1.588 m), weight 113 lb 3.2 oz (51.3 kg), SpO2 99 %.    HEENT: No thrush Resp: Good air movement bilaterally, no respiratory distress Cardio: Regular rate and rhythm GI: No hepatosplenomegaly, nontender Vascular: No leg edema  Skin: No rash   Lab Results:  Lab Results  Component Value Date   WBC 5.9 01/03/2017   HGB 10.4 (L) 01/03/2017   HCT 32.0 (L) 01/03/2017   MCV 78.8 (L) 01/03/2017   PLT 289 01/03/2017   NEUTROABS 4.2 01/03/2017    CMP     Component Value Date/Time   NA 139 01/03/2017 0950   K 4.5 01/03/2017 0950   CL 104 10/31/2016 0326   CO2 27 01/03/2017 0950   GLUCOSE 81 01/03/2017 0950   BUN 11.1 01/03/2017 0950   CREATININE 0.5 (L) 01/03/2017 0950   CALCIUM 9.8 01/03/2017 0950   PROT 7.0 01/03/2017 0950   ALBUMIN 3.1 (L) 01/03/2017 0950   AST 15 01/03/2017 0950   ALT 11 01/03/2017 0950   ALKPHOS 88 01/03/2017 0950   BILITOT 0.24 01/03/2017 0950   GFRNONAA >60 10/31/2016 0326   GFRAA >60 10/31/2016 0326    Medications: I have reviewed the patient's current medications.  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 Acute respiratory failure secondary to #1  3. Oxygen dependent COPD  4. History of a NSTEMI 2015  5. Right lung pneumonia on chest CT 12/12/2016-treated with Levaquin     Disposition:  Brooke [name] appears well. She tolerated the first treatment with pembrolizumab without significant acute toxicity. The plan is to proceed with cycle 2 today. She will return for an office visit and pembrolizumab in 3 weeks. We will plan for a restaging CT evaluation in approximately 3 months.  Her performance status has improved significantly over the past several weeks. She plans to return home next week. She will receive an influenza vaccine when she sees Dr. Redmond Pulling in a few weeks.  15 minutes were spent with the patient today. The majority of the time was used for counseling and coordination of care.  Donneta Romberg, MD  01/03/2017  10:35 AM

## 2017-01-08 ENCOUNTER — Ambulatory Visit (INDEPENDENT_AMBULATORY_CARE_PROVIDER_SITE_OTHER): Payer: Medicare Other | Admitting: Internal Medicine

## 2017-01-08 ENCOUNTER — Encounter: Payer: Self-pay | Admitting: Internal Medicine

## 2017-01-08 VITALS — BP 104/62 | HR 83 | Ht 62.5 in | Wt 112.0 lb

## 2017-01-08 DIAGNOSIS — R918 Other nonspecific abnormal finding of lung field: Secondary | ICD-10-CM

## 2017-01-08 DIAGNOSIS — J9611 Chronic respiratory failure with hypoxia: Secondary | ICD-10-CM | POA: Diagnosis not present

## 2017-01-08 DIAGNOSIS — J449 Chronic obstructive pulmonary disease, unspecified: Secondary | ICD-10-CM

## 2017-01-08 NOTE — Assessment & Plan Note (Addendum)
Spirometry 02/15/2016  FEV1 0.60 (28%)  Ratio 38 with classic curvature  p am symbicort 160   - 01/08/2017  After extensive coaching HFA effectiveness =    75% from a baseline 50% > continue dulera 200 2bid   I doubt she is really a true GOLD IV but doing well enough on laba/ics to try just using saba hfa prn/ duoneb as plan C - see avs for instructions unique to this ov

## 2017-01-08 NOTE — Patient Instructions (Addendum)
Symbicort 160 = Dulera 200  Goal is to keep saturation over 90% at rest and with exertion/ continue 02 2lpm at bedtime  Plan A = Automatic =  dulera 200 Take 2 puffs first thing in am and then another 2 puffs about 12 hours later.   Work on inhaler technique:  relax and gently blow all the way out then take a nice smooth deep breath back in, triggering the inhaler at same time you start breathing in.  Hold for up to 5 seconds if you can. Blow out thru nose. Rinse and gargle with water when done     Plan B = Backup Only use your albuterol as a rescue medication to be used if you can't catch your breath by resting or doing a relaxed purse lip breathing pattern.  - The less you use it, the better it will work when you need it. - Ok to use the inhaler up to 2 puffs  every 4 hours if you must but call for appointment if use goes up over your usual need - Don't leave home without it !!  (think of it like the spare tire for your car)   Plan C = Crisis - only use your albuterol (duoneb) nebulizer if you first try Plan B and it fails to help > ok to use the nebulizer up to every 4 hours but if start needing it regularly call for immediate appointment  See Tammy NP w/in 4 weeks with all your medications, even over the counter meds, separated in two separate bags, the ones you take no matter what vs the ones you stop once you feel better and take only as needed when you feel you need them.   Tammy  will generate for you a new user friendly medication calendar that will put [place] all on the same page re: your medication use.     Without this process, it simply isn't possible to assure that we are providing  your outpatient care  with  the attention to detail we feel you deserve.   If we cannot assure that you're getting that kind of care,  then we cannot manage your problem effectively from this clinic.  Once you have seen Tammy and we are sure that we're all on the same page with your medication use she  will arrange follow up with me.

## 2017-01-08 NOTE — Progress Notes (Signed)
Subjective:    Patient ID: Brooke [name], female   DOB: 03/20/1946,    MRN: [id number]     Brief patient profile:  71 yowf quit smoking 2009 on 02 noct 2012 maint on symbicort 160 since aorund  2014 referred to pulmonary clinic 02/15/2016 by Dr   Kathryne Eriksson for RUL Lung mass dx with Stage IV nsc 11/01/16 in setting of GOLD IV copd    History of Present Illness  02/15/2016 1st [facility] office visit/ Brittini Brubeck  GOLD IV copd/ symb 160 2bid maint  Chief Complaint  Patient presents with  . Pulmonary Consult    referred by Dr. Kathryne Eriksson for COPD.  MMRC2 = can't walk a nl pace on a flat grade s sob but does fine slow and flat eg [employer]/ [employer] on 2lpm  New onset bloody mucus plugs November 11 2015 assoc with wt loss  02 2lpm hs and with walking / rare saba needed rec For cough > mucinex up to 1200 mg every 12 hours as needed Rec:  Fob but refused   11/01/16   CT directed bx >>>  Pos [facility] LUL    Diagnosis:   Stage IV NSCLC, Squamous cell carcinoma of the left upper lobe.     Indication for treatment:  palliative       Radiation treatment dates:   11/08/2016 to 11/21/2016  Site/dose:   The Left lung was treated to 30 Gy in 10 fractions at 3 Gy per fraction.   Beams/energy:   3D // 10X, 6X    12/06/2016  f/u ov/Malone Admire re: post hosp f/u -  Now on dulera 200 bid and duoneb Chief Complaint  Patient presents with  . HFU    Breathing is doing well. She is using Duoneb 2 x daily and uses albuterol inhaler 1-2 x per wk on average.   presently in SNF ever since last admit 6/18-29/18 for  Active Problems:   COPD  GOLD IV    Mass of upper lobe of left lung   Chronic respiratory failure with hypoxia (HCC)   COPD exacerbation (HCC)   Hypokalemia   Microcytic anemia   SOB (shortness of breath)   Protein-calorie malnutrition, severe Doe = 2lpm x 18ft with rolling walking  Sleeping ok at < 30 degrees Doe = 2lpm x 181ft with rolling walker/2lpm Sleeping ok at < 30 degrees   rec 02 can be adjusted down and off for saturations above 90%     12/12/16 rx Levaquin for ? RLL pna by oncology based on CT findings  though no clear clinical infection    01/08/2017  f/u ov/Arlena Marsan re:   GOLD IV copd/  [place] lung ca s/p 10 RT and on [place]  Chief Complaint  Patient presents with  . Follow-up    Pt states going to be discharged from home on 01/09/17. She states her breathing is doing well and no co's.   doe improving at SNF > doing some outdoors walking pushing w/c on 02(puts it in the w/c)  including some inclines  2lpm exertion and hs but not at rest / still using duoneb bid (not prn) and dulera 200 2bid with poor hfa (see copd ap)   No obvious day to day or daytime variability or assoc excess/ purulent sputum or mucus plugs or hemoptysis or cp or chest tightness, subjective wheeze or overt sinus or hb symptoms. No unusual exp hx or h/o childhood pna/ asthma or knowledge of premature birth.  Sleeping  ok without nocturnal  or early am exacerbation  of respiratory  c/o's or need for noct saba. Also denies any obvious fluctuation of symptoms with weather or environmental changes or other aggravating or alleviating factors except as outlined above   Current Medications, Allergies, Complete Past Medical History, Past Surgical History, Family History, and Social History were reviewed in [employer] record.  ROS  The following are not active complaints unless bolded sore throat, dysphagia, dental problems, itching, sneezing,  nasal congestion or excess/ purulent secretions, ear ache,   fever, chills, sweats, unintended wt loss, classically pleuritic or exertional cp,  orthopnea pnd or leg swelling, presyncope, palpitations, abdominal pain, anorexia, nausea, vomiting, diarrhea  or change in bowel or bladder habits, change in stools or urine, dysuria,hematuria,  rash, arthralgias, visual complaints, headache, numbness, weakness generalized/ improving or ataxia or  problems with walking or coordination,  change in mood/affect or memory.            Objective:   Physical Exam    amb pleasant wf nad  01/08/2017         112   12/06/2016        110   02/15/16 119 lb (54 kg)  08/18/15 137 lb 12.8 oz (62.5 kg)  02/17/15 138 lb 1.9 oz (62.7 kg)    Vital signs reviewed - Note on arrival 02 sats  97% on  RA    HEENT: nl dentition, turbinates, and oropharynx. Nl external ear canals without cough reflex   NECK :  without JVD/Nodes/TM/ nl carotid upstrokes bilaterally   LUNGS: no acc muscle use,   Barrel chest/ distant bs s localized wheeze/ rhonchi   CV:  RRR  no s3 or murmur or increase in P2, no edema   ABD:  soft and nontender with pos Hoover's early to mid inspiration in the supine position. No bruits or organomegaly, bowel sounds nl  MS:  Nl gait/ ext warm without deformities, calf tenderness, cyanosis or clubbing No obvious joint restrictions   SKIN: warm and dry without lesions    NEURO:  alert, approp, nl sensorium with  no motor deficits      I personally reviewed images and agree with radiology impression as follows:   Chest CT 12/12/16 posterior right lower lobe, highly suggestive of pneumonia. 2. Interval re-expansion of left upper lobe with decrease in the left upper lobe suprahilar mass measuring about 3.6 cm today. There is some residual abnormal lung or pleural disease medially along the mediastinum. 3. No substantial change and AP window metastatic lymphadenopathy. 4. Interval progression of large splenic metastasis with progression of metastatic disease in the region of the pancreatic tail and gastrohepatic ligament. 5.  Emphysema. (ICD10-J43.9) 6.  Aortic Atherosclerois (ICD10-170.0)    Assessment:

## 2017-01-09 NOTE — Assessment & Plan Note (Signed)
See CT Chest 01/26/16 c/w ? Stage  lung ca arising in LUL - PET 02/28/16 1. Large lobular hypermetabolic mass in the LEFT upper lobe abutting the oblique fissure consists with primary bronchogenic carcinoma. 2. Evidence of ipsilateral hypermetabolic mediastinal nodal Metastasis only > 02/29/2016  referred for EBUS/ Dr [place] Bells > declined  -11/01/16   CT directed bx  Pos [place] LUL  > RT palliative completed 11/21/16 / rx per oncology = Keytruda   She is doing surprisingly well s adverse effects from either RT or chemo so far > f/u oncology planned

## 2017-01-09 NOTE — Assessment & Plan Note (Signed)
rx 2lpm hs and prn daytime since 2014   No change in recst  I had an extended discussion with the patient reviewing all relevant studies completed to date and  lasting 15 to 20 minutes of a 25 minute visit    Each maintenance medication was reviewed in detail including most importantly the difference between maintenance and prns and under what circumstances the prns are to be triggered using an action plan format that is not reflected in the computer generated alphabetically organized AVS.    Please see AVS for specific instructions unique to this visit that I personally wrote and verbalized to the the pt in detail and then reviewed with pt  by my nurse highlighting any  changes in therapy recommended at today's visit to their plan of care.    F/u will be in 4 weeks for med reconciliation as will be on her own by then planning d/c from snf soon

## 2017-01-10 ENCOUNTER — Telehealth: Payer: Self-pay | Admitting: *Deleted

## 2017-01-10 NOTE — Telephone Encounter (Signed)
Message from pt asking if OK to continue with routine dental cleaning while on Keytruda. Returned call, informed her it is OK to have dental cleaning done.  Reviewed Keytruda side effects. Encouraged pt to call with any additional questions.

## 2017-01-14 ENCOUNTER — Ambulatory Visit
Admission: RE | Admit: 2017-01-14 | Discharge: 2017-01-14 | Disposition: A | Discharge status: Home / Self Care | Payer: Medicare Other | Source: Ambulatory Visit | Attending: Radiation Oncology | Admitting: Radiation Oncology

## 2017-01-14 ENCOUNTER — Encounter: Payer: Self-pay | Admitting: Radiation Oncology

## 2017-01-14 VITALS — BP 110/75 | HR 82 | Temp 97.9°F | Resp 16 | Ht 62.5 in | Wt 112.4 lb

## 2017-01-14 DIAGNOSIS — Z9981 Dependence on supplemental oxygen: Secondary | ICD-10-CM | POA: Insufficient documentation

## 2017-01-14 DIAGNOSIS — C3412 Malignant neoplasm of upper lobe, left bronchus or lung: Secondary | ICD-10-CM

## 2017-01-14 DIAGNOSIS — Z885 Allergy status to narcotic agent status: Secondary | ICD-10-CM | POA: Insufficient documentation

## 2017-01-14 DIAGNOSIS — Z888 Allergy status to other drugs, medicaments and biological substances status: Secondary | ICD-10-CM | POA: Diagnosis not present

## 2017-01-14 DIAGNOSIS — Z9889 Other specified postprocedural states: Secondary | ICD-10-CM | POA: Insufficient documentation

## 2017-01-14 DIAGNOSIS — Z79899 Other long term (current) drug therapy: Secondary | ICD-10-CM | POA: Diagnosis not present

## 2017-01-14 NOTE — Progress Notes (Signed)
Radiation Oncology         [phone]) [phone] ________________________________  Name: Brooke [name] MRN: [id number]  Date of Service: 01/14/2017 DOB: 04-27-1946  Post Treatment Note  CC: Brooke Sacramento, MD  [name], MD  Diagnosis:   Stage IV NSCLC, Squamous cell carcinoma of the left upper lobe.     Interval Since Last Radiation:  8 weeks   11/08/2016 to 11/21/2016: The Left lung was treated to 30 Gy in 10 fractions at 3 Gy per fraction.   Narrative:  The patient returns today for routine follow-up.  She has undergone palliative radiotherapy which has improved her tumor from almost 10 cm, down to 3.5 cm. She did have mild change in the pancreatic mass, but remains on immunotherapy. She will continue with [name] and [name], NP. She recently saw Dr. Melvyn Novas and is tapering her oxygen use, and at rest is about 97% O2 saturation on room air, but continues her oxygen with exertion.                               On review of systems, the patient states she feels much better than when she started radiation. She denies any shortness of breath unless she doesn't have oxygen on while exerting herself, or if she bends over and CSF abruptly. She states that she is not having any productive mucus unless she washes her mouth gargles after using her Dulera. And she states that this is clear mucus. She denies any hemoptysis, fevers or chills. She denies any chest pain at this time, and is using Tylenol as needed for arthritis. No other complaints or verbalized.  ALLERGIES:  is allergic to codeine; imdur [isosorbide dinitrate]; prednisone; and pulmicort [budesonide].  Meds: Current Outpatient Prescriptions  Medication Sig Dispense Refill  . acetaminophen (TYLENOL) 325 MG tablet Take 650 mg by mouth every 6 (six) hours as needed.    Marland Kitchen albuterol (PROVENTIL HFA;VENTOLIN HFA) 108 (90 BASE) MCG/ACT inhaler Inhale 2 puffs into the lungs every 6 (six) hours as needed. For wheezing/shortness of  breath    . atorvastatin (LIPITOR) 20 MG tablet Take 1 tablet (20 mg total) by mouth daily. 90 tablet 3  . guaiFENesin (MUCINEX) 600 MG 12 hr tablet Take 600 mg by mouth 2 (two) times daily.    Marland Kitchen ipratropium-albuterol (DUONEB) 0.5-2.5 (3) MG/3ML SOLN Take 3 mLs by nebulization 2 (two) times daily.    . mometasone-formoterol (DULERA) 200-5 MCG/ACT AERO Inhale 2 puffs into the lungs 2 (two) times daily.    . OXYGEN 2lpm 24/7    . sodium chloride (OCEAN) 0.65 % SOLN nasal spray Place 1 spray into both nostrils as needed for congestion.    . docusate sodium (COLACE) 100 MG capsule Take 100 mg by mouth 2 (two) times daily.    . nitroGLYCERIN (NITROSTAT) 0.4 MG SL tablet ONE TABLET UNDER TONGUE AS NEEDED FOR CHEST PAIN EVERY 5 MINUTES FOR 3 DOSES (Patient not taking: Reported on 01/14/2017) 25 tablet 3  . polyethylene glycol (MIRALAX / GLYCOLAX) packet Take 17 g by mouth daily.     No current facility-administered medications for this encounter.     Physical Findings:  height is 5' 2.5" (1.588 m) and weight is 112 lb 6.4 oz (51 kg). Her oral temperature is 97.9 F (36.6 C). Her blood pressure is 110/75 and her pulse is 82. Her respiration is 16 and oxygen saturation is 100%.  Pain Assessment Pain Score: 0-No pain/10 In general this is a well appearing Caucasian female in no acute distress. she's alert and oriented x4 and appropriate throughout the examination. Cardiovascular exam reveals a regular rate and rhythm, no clicks rubs or murmurs auscultated. Chest is auscultated and clear bilaterally in all fields. No wheezing, or rales are noted.  Lab Findings: Lab Results  Component Value Date   WBC 5.9 01/03/2017   HGB 10.4 (L) 01/03/2017   HCT 32.0 (L) 01/03/2017   MCV 78.8 (L) 01/03/2017   PLT 289 01/03/2017     Radiographic Findings: No results found.  Impression/Plan: 1. Stage IV NSCLC, Squamous cell carcinoma of the left upper lobe.   the patient is doing well since completing  radiotherapy. We discussed the role for follow-up with [name] and Roderick Pee, NP. We will be happy to see her back as needed moving forward, and she is aware of the risks of pneumonitis, and although low, understands that she needs to contact us if she develops the symptoms. Copies of her recent imaging have been provided to her as well. 2. Prophylaxis. The patient requests information about timing of having a flu vaccine. I will copy [name] on this as well as that he can respond to her.     Carola Rhine, PAC

## 2017-01-14 NOTE — Addendum Note (Signed)
Encounter addended by: Malena Edman, RN on: 01/14/2017  5:02 PM<BR>    Actions taken: Charge Capture section accepted

## 2017-01-20 ENCOUNTER — Other Ambulatory Visit: Payer: Self-pay | Admitting: Oncology

## 2017-01-23 ENCOUNTER — Other Ambulatory Visit: Payer: Self-pay | Admitting: Nurse Practitioner

## 2017-01-23 DIAGNOSIS — C3412 Malignant neoplasm of upper lobe, left bronchus or lung: Secondary | ICD-10-CM

## 2017-01-24 ENCOUNTER — Ambulatory Visit (HOSPITAL_BASED_OUTPATIENT_CLINIC_OR_DEPARTMENT_OTHER): Payer: Medicare Other | Admitting: Nurse Practitioner

## 2017-01-24 ENCOUNTER — Telehealth: Payer: Self-pay | Admitting: Nurse Practitioner

## 2017-01-24 ENCOUNTER — Ambulatory Visit (HOSPITAL_BASED_OUTPATIENT_CLINIC_OR_DEPARTMENT_OTHER): Payer: Medicare Other

## 2017-01-24 ENCOUNTER — Other Ambulatory Visit (HOSPITAL_BASED_OUTPATIENT_CLINIC_OR_DEPARTMENT_OTHER): Payer: Medicare Other

## 2017-01-24 VITALS — BP 103/61 | HR 80 | Temp 98.4°F | Resp 18 | Ht 62.5 in | Wt 112.9 lb

## 2017-01-24 DIAGNOSIS — J9600 Acute respiratory failure, unspecified whether with hypoxia or hypercapnia: Secondary | ICD-10-CM | POA: Diagnosis not present

## 2017-01-24 DIAGNOSIS — C3412 Malignant neoplasm of upper lobe, left bronchus or lung: Secondary | ICD-10-CM

## 2017-01-24 DIAGNOSIS — C7989 Secondary malignant neoplasm of other specified sites: Secondary | ICD-10-CM | POA: Diagnosis not present

## 2017-01-24 DIAGNOSIS — Z5112 Encounter for antineoplastic immunotherapy: Secondary | ICD-10-CM | POA: Diagnosis not present

## 2017-01-24 DIAGNOSIS — Z923 Personal history of irradiation: Secondary | ICD-10-CM

## 2017-01-24 LAB — COMPREHENSIVE METABOLIC PANEL
ALBUMIN: 3.6 g/dL (ref 3.5–5.0)
ALK PHOS: 82 U/L (ref 40–150)
ALT: 12 U/L (ref 0–55)
ANION GAP: 8 meq/L (ref 3–11)
AST: 13 U/L (ref 5–34)
BUN: 16.7 mg/dL (ref 7.0–26.0)
CALCIUM: 9.6 mg/dL (ref 8.4–10.4)
CHLORIDE: 107 meq/L (ref 98–109)
CO2: 25 mEq/L (ref 22–29)
Creatinine: 0.6 mg/dL (ref 0.6–1.1)
Glucose: 71 mg/dl (ref 70–140)
POTASSIUM: 4.4 meq/L (ref 3.5–5.1)
Sodium: 140 mEq/L (ref 136–145)
Total Bilirubin: 0.31 mg/dL (ref 0.20–1.20)
Total Protein: 6.9 g/dL (ref 6.4–8.3)

## 2017-01-24 LAB — CBC WITH DIFFERENTIAL/PLATELET
BASO%: 0.5 % (ref 0.0–2.0)
BASOS ABS: 0 10*3/uL (ref 0.0–0.1)
EOS ABS: 0.1 10*3/uL (ref 0.0–0.5)
EOS%: 2.1 % (ref 0.0–7.0)
HEMATOCRIT: 37 % (ref 34.8–46.6)
HGB: 12 g/dL (ref 11.6–15.9)
LYMPH#: 0.9 10*3/uL (ref 0.9–3.3)
LYMPH%: 19.1 % (ref 14.0–49.7)
MCH: 26.2 pg (ref 25.1–34.0)
MCHC: 32.4 g/dL (ref 31.5–36.0)
MCV: 80.9 fL (ref 79.5–101.0)
MONO#: 0.4 10*3/uL (ref 0.1–0.9)
MONO%: 8.5 % (ref 0.0–14.0)
NEUT#: 3.2 10*3/uL (ref 1.5–6.5)
NEUT%: 69.8 % (ref 38.4–76.8)
PLATELETS: 214 10*3/uL (ref 145–400)
RBC: 4.58 10*6/uL (ref 3.70–5.45)
RDW: 21.2 % — ABNORMAL HIGH (ref 11.2–14.5)
WBC: 4.6 10*3/uL (ref 3.9–10.3)

## 2017-01-24 MED ORDER — SODIUM CHLORIDE 0.9 % IV SOLN
Freq: Once | INTRAVENOUS | Status: AC
  Administered 2017-01-24: 11:00:00 via INTRAVENOUS

## 2017-01-24 MED ORDER — PEMBROLIZUMAB CHEMO INJECTION 100 MG/4ML
200.0000 mg | Freq: Once | INTRAVENOUS | Status: AC
  Administered 2017-01-24: 200 mg via INTRAVENOUS
  Filled 2017-01-24: qty 8

## 2017-01-24 NOTE — Patient Instructions (Signed)
[facility] Discharge Instructions for Patients Receiving Chemotherapy  Today you received the following chemotherapy agents: Keytruda   To help prevent nausea and vomiting after your treatment, we encourage you to take your nausea medication as directed    If you develop nausea and vomiting that is not controlled by your nausea medication, call the clinic.   BELOW ARE SYMPTOMS THAT SHOULD BE REPORTED IMMEDIATELY:  *FEVER GREATER THAN 100.5 F  *CHILLS WITH OR WITHOUT FEVER  NAUSEA AND VOMITING THAT IS NOT CONTROLLED WITH YOUR NAUSEA MEDICATION  *UNUSUAL SHORTNESS OF BREATH  *UNUSUAL BRUISING OR BLEEDING  TENDERNESS IN MOUTH AND THROAT WITH OR WITHOUT PRESENCE OF ULCERS  *URINARY PROBLEMS  *BOWEL PROBLEMS  UNUSUAL RASH Items with * indicate a potential emergency and should be followed up as soon as possible.  Feel free to call the clinic you have any questions or concerns. The clinic phone number is [phone].  Please show the CHEMO ALERT CARD at check-in to the Emergency Department and triage nurse.   

## 2017-01-24 NOTE — Telephone Encounter (Signed)
Scheduled appt per 9/20 los - per [name] okay to schedule per patient request on 10/9

## 2017-01-24 NOTE — Progress Notes (Signed)
  [facility] OFFICE PROGRESS NOTE   Diagnosis:  Non-small cell lung cancer  INTERVAL HISTORY:   Brooke [name] returns as scheduled. She completed cycle 2 Pembrolizumab 01/03/2017. She denies nausea/vomiting. No mouth sores. No diarrhea. No rash. Dyspnea continues to be improved. She utilizes supplemental oxygen at bedtime and as needed during the day. No fever or cough.  Objective:  Vital signs in last 24 hours:  Blood pressure 103/61, pulse 80, temperature 98.4 F (36.9 C), temperature source Oral, resp. rate 18, height 5' 2.5" (1.588 m), weight 112 lb 14.4 oz (51.2 kg), SpO2 100 %.    HEENT: No thrush or ulcers. Resp: Good air movement bilaterally. No respiratory distress. Cardio: Regular rate and rhythm. GI: Abdomen soft and nontender. No hepatosplenomegaly. Vascular: No leg edema. Calves soft and nontender. Skin: No rash.    Lab Results:  Lab Results  Component Value Date   WBC 4.6 01/24/2017   HGB 12.0 01/24/2017   HCT 37.0 01/24/2017   MCV 80.9 01/24/2017   PLT 214 01/24/2017   NEUTROABS 3.2 01/24/2017    Imaging:  No results found.  Medications: I have reviewed the patient's current medications.  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 Acute respiratory failure secondary to #1  3. Oxygen dependent COPD  4. History of a NSTEMI 2015  5. Right lung pneumonia on chest CT 12/12/2016-treated with Levaquin    Disposition: Brooke [name] appears stable. She has completed 2 cycles of Pembrolizumab. She continues to tolerate it well. Plan to proceed with cycle 3 today as scheduled.  We discussed the influenza vaccine. She declines this at present.  She will return for a follow-up visit and cycle 4 Pembrolizumab in 3 weeks. She will contact the office in the interim with any problems.    [name] ANP/GNP-BC   01/24/2017  10:15 AM

## 2017-02-05 ENCOUNTER — Encounter: Payer: Self-pay | Admitting: Adult Health

## 2017-02-05 ENCOUNTER — Ambulatory Visit (INDEPENDENT_AMBULATORY_CARE_PROVIDER_SITE_OTHER): Payer: Medicare Other | Admitting: Adult Health

## 2017-02-05 DIAGNOSIS — C3412 Malignant neoplasm of upper lobe, left bronchus or lung: Secondary | ICD-10-CM

## 2017-02-05 DIAGNOSIS — J9611 Chronic respiratory failure with hypoxia: Secondary | ICD-10-CM

## 2017-02-05 DIAGNOSIS — J449 Chronic obstructive pulmonary disease, unspecified: Secondary | ICD-10-CM

## 2017-02-05 NOTE — Patient Instructions (Addendum)
Continue on current regimen .  May use Saline nasal spray and gel As needed   Follow med calendar closely and bring to each visit .  Continue on Oxygen 2l/m  Follow up with Dr. Melvyn Novas  In 4 months and As needed

## 2017-02-05 NOTE — Progress Notes (Signed)
'@Patient'  ID: Brooke [name], female    DOB: [birth year], 71 y.o.   MRN: [id number]  Chief Complaint  Patient presents with  . 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)   TEST  Spirometry 02/15/2016  FEV1 0.60 (28%)  Ratio 38   Oncology notes  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 She says her breathing is doing okay , no flare of cough or dyspnea. She remains on Symbicort Twice daily  . (does use Dulera in place depending on what insurance covers). Does not want flu shot right now.   We reviewed all her meds and organized them into a med calendar with pt education . Appears to be taking correctly .   She is on oxygen 2l/m . Feels it helps her with dyspnea.   She is followed by Oncology for LUL NSCLC /Squamous cell . She is s/p XRT . She is undergoing Immunotherapy w/ Keytruda . Says she is tolerating well.      Allergies  Allergen Reactions   . Codeine Nausea And Vomiting  . Imdur [Isosorbide Dinitrate]     headache  . Prednisone Other (See Comments)    Reaction:Abnormal behavior; cannot take in pill form but CAN tolerate the injection  . Pulmicort [Budesonide]     "feeling of torture"    Immunization History  Administered Date(s) Administered  . Influenza, High Dose Seasonal PF 02/08/2016  . Influenza,inj,Quad PF,6+ Mos 02/08/2016  . Pneumococcal Conjugate-13 02/05/2015  . Pneumococcal Polysaccharide-23 02/04/2013    Past Medical History:  Diagnosis Date  . Chronic respiratory failure (HCC)    a. on home O2.  Marland Kitchen COPD (chronic obstructive pulmonary disease) ([place])    a. Home O2.  . Former tobacco use   . GERD (gastroesophageal reflux disease)   . Hyperlipidemia   . NSTEMI (non-ST elevated myocardial infarction) ([place])    a. 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 Coronary vasospasm    Tobacco History: History  Smoking Status  . Former Smoker  . Packs/day: 1.00  . Years: 30.00  . Types: Cigarettes  . Quit date: 05/08/2007  Smokeless Tobacco  . Never Used   Counseling given: Not Answered   Outpatient Encounter Prescriptions as of 02/05/2017  Medication Sig  . acetaminophen (TYLENOL) 325 MG tablet Take 650 mg by mouth every 6 (six) hours as needed.  Marland Kitchen albuterol (PROVENTIL HFA;VENTOLIN HFA) 108 (90 BASE) MCG/ACT inhaler Inhale 2 puffs into the lungs every 6 (six) hours as needed.  For wheezing/shortness of breath  . atorvastatin (LIPITOR) 20 MG tablet Take 1 tablet (20 mg total) by mouth daily.  Marland Kitchen docusate sodium (COLACE) 100 MG capsule Take 100 mg by mouth 2 (two) times daily.  Marland Kitchen guaiFENesin (MUCINEX) 600 MG 12 hr tablet Take 600 mg by mouth 2 (two) times daily.  Marland Kitchen ipratropium-albuterol (DUONEB) 0.5-2.5 (3) MG/3ML SOLN Take 3 mLs by nebulization 2 (two) times daily.  . mometasone-formoterol (DULERA) 200-5 MCG/ACT AERO Inhale 2 puffs into the lungs 2  (two) times daily.  . nitroGLYCERIN (NITROSTAT) 0.4 MG SL tablet ONE TABLET UNDER TONGUE AS NEEDED FOR CHEST PAIN EVERY 5 MINUTES FOR 3 DOSES (Patient not taking: Reported on 01/24/2017)  . OXYGEN 2lpm 24/7  . polyethylene glycol (MIRALAX / GLYCOLAX) packet Take 17 g by mouth daily.  . sodium chloride (OCEAN) 0.65 % SOLN nasal spray Place 1 spray into both nostrils as needed for congestion.   No facility-administered encounter medications on file as of 02/05/2017.      Review of Systems  Constitutional:   No  weight loss, night sweats,  Fevers, chills, fatigue, or  lassitude.  HEENT:   No headaches,  Difficulty swallowing,  Tooth/dental problems, or  Sore throat,                No sneezing, itching, ear ache, nasal congestion, post nasal drip,   CV:  No chest pain,  Orthopnea, PND, swelling in lower extremities, anasarca, dizziness, palpitations, syncope.   GI  No heartburn, indigestion, abdominal pain, nausea, vomiting, diarrhea, change in bowel habits, loss of appetite, bloody stools.   Resp: No shortness of breath with exertion or at rest.  No excess mucus, no productive cough,  No non-productive cough,  No coughing up of blood.  No change in color of mucus.  No wheezing.  No chest wall deformity  Skin: no rash or lesions.  GU: no dysuria, change in color of urine, no urgency or frequency.  No flank pain, no hematuria   MS:  No joint pain or swelling.  No decreased range of motion.  No back pain.    Physical Exam  BP 108/64 (BP Location: Left Arm, Cuff Size: Normal)   Pulse 79   Ht 5' 3.25" (1.607 m)   Wt 114 lb 6.4 oz (51.9 kg)   SpO2 98%   BMI 20.11 kg/m   GEN: A/Ox3; pleasant , NAD, thin and frail /elderly    HEENT:  [place]/AT,  EACs-clear, TMs-wnl, NOSE-clear, THROAT-clear, no lesions, no postnasal drip or exudate noted.   NECK:  Supple w/ fair ROM; no JVD; normal carotid impulses w/o bruits; no thyromegaly or nodules palpated; no lymphadenopathy.    RESP  Clear  P &  A; w/o, wheezes/ rales/ or rhonchi. no accessory muscle use, no dullness to percussion  CARD:  RRR, no m/r/g, no peripheral edema, pulses intact, no cyanosis or clubbing.  GI:   Soft & nt; nml bowel sounds; no organomegaly or masses detected.   Musco: Warm bil, no deformities or joint swelling noted.   Neuro: alert, no focal deficits noted.    Skin: Warm, no lesions or rashes    Lab Results: Imaging: No results found.   Assessment & Plan:   COPD  GOLD IV  Stable without flare  Patient's medications were reviewed today and patient education was given. Computerized medication calendar was adjusted/completed   Plan Patient Instructions  Continue on current regimen .  May use Saline nasal spray and gel As  needed   Follow med calendar closely and bring to each visit .  Continue on Oxygen 2l/m  Follow up with Dr. Melvyn Novas  In 4 months and As needed      Chronic respiratory failure with hypoxia ([place]) Cont on O2   Malignant neoplasm of bronchus of left upper lobe ([place]) Cont follow up with Oncology as planned     Rexene Edison, NP 02/05/2017

## 2017-02-05 NOTE — Assessment & Plan Note (Signed)
Cont on O2 .  

## 2017-02-05 NOTE — Progress Notes (Signed)
Chart and office note reviewed in detail  > agree with a/p as outlined    

## 2017-02-05 NOTE — Assessment & Plan Note (Signed)
Cont follow up with Oncology as planned

## 2017-02-05 NOTE — Assessment & Plan Note (Signed)
Stable without flare  Patient's medications were reviewed today and patient education was given. Computerized medication calendar was adjusted/completed   Plan Patient Instructions  Continue on current regimen .  May use Saline nasal spray and gel As needed   Follow med calendar closely and bring to each visit .  Continue on Oxygen 2l/m  Follow up with Dr. Melvyn Novas  In 4 months and As needed

## 2017-02-12 ENCOUNTER — Telehealth: Payer: Self-pay | Admitting: Oncology

## 2017-02-12 ENCOUNTER — Ambulatory Visit (HOSPITAL_BASED_OUTPATIENT_CLINIC_OR_DEPARTMENT_OTHER): Payer: Medicare Other | Admitting: Oncology

## 2017-02-12 ENCOUNTER — Ambulatory Visit (HOSPITAL_BASED_OUTPATIENT_CLINIC_OR_DEPARTMENT_OTHER): Payer: Medicare Other

## 2017-02-12 ENCOUNTER — Other Ambulatory Visit (HOSPITAL_BASED_OUTPATIENT_CLINIC_OR_DEPARTMENT_OTHER): Payer: Medicare Other

## 2017-02-12 VITALS — BP 109/62 | HR 85 | Temp 97.9°F | Resp 17 | Ht 63.25 in | Wt 113.2 lb

## 2017-02-12 DIAGNOSIS — C3412 Malignant neoplasm of upper lobe, left bronchus or lung: Secondary | ICD-10-CM

## 2017-02-12 DIAGNOSIS — Z79899 Other long term (current) drug therapy: Secondary | ICD-10-CM

## 2017-02-12 DIAGNOSIS — Z923 Personal history of irradiation: Secondary | ICD-10-CM | POA: Diagnosis not present

## 2017-02-12 DIAGNOSIS — C7989 Secondary malignant neoplasm of other specified sites: Secondary | ICD-10-CM | POA: Diagnosis not present

## 2017-02-12 DIAGNOSIS — Z5112 Encounter for antineoplastic immunotherapy: Secondary | ICD-10-CM | POA: Diagnosis not present

## 2017-02-12 LAB — CBC WITH DIFFERENTIAL/PLATELET
BASO%: 0.5 % (ref 0.0–2.0)
BASOS ABS: 0 10*3/uL (ref 0.0–0.1)
EOS ABS: 0.2 10*3/uL (ref 0.0–0.5)
EOS%: 4.6 % (ref 0.0–7.0)
HCT: 38.1 % (ref 34.8–46.6)
HEMOGLOBIN: 12.4 g/dL (ref 11.6–15.9)
LYMPH%: 12.2 % — AB (ref 14.0–49.7)
MCH: 26.9 pg (ref 25.1–34.0)
MCHC: 32.5 g/dL (ref 31.5–36.0)
MCV: 82.8 fL (ref 79.5–101.0)
MONO#: 0.5 10*3/uL (ref 0.1–0.9)
MONO%: 9.8 % (ref 0.0–14.0)
NEUT%: 72.9 % (ref 38.4–76.8)
NEUTROS ABS: 3.8 10*3/uL (ref 1.5–6.5)
PLATELETS: 209 10*3/uL (ref 145–400)
RBC: 4.6 10*6/uL (ref 3.70–5.45)
RDW: 19.2 % — AB (ref 11.2–14.5)
WBC: 5.3 10*3/uL (ref 3.9–10.3)
lymph#: 0.6 10*3/uL — ABNORMAL LOW (ref 0.9–3.3)

## 2017-02-12 LAB — COMPREHENSIVE METABOLIC PANEL
ALBUMIN: 3.5 g/dL (ref 3.5–5.0)
ALK PHOS: 89 U/L (ref 40–150)
ALT: 13 U/L (ref 0–55)
ANION GAP: 8 meq/L (ref 3–11)
AST: 15 U/L (ref 5–34)
BILIRUBIN TOTAL: 0.27 mg/dL (ref 0.20–1.20)
BUN: 11.7 mg/dL (ref 7.0–26.0)
CO2: 25 mEq/L (ref 22–29)
Calcium: 9.8 mg/dL (ref 8.4–10.4)
Chloride: 106 mEq/L (ref 98–109)
Creatinine: 0.6 mg/dL (ref 0.6–1.1)
Glucose: 100 mg/dl (ref 70–140)
Potassium: 4.3 mEq/L (ref 3.5–5.1)
Sodium: 139 mEq/L (ref 136–145)
TOTAL PROTEIN: 7 g/dL (ref 6.4–8.3)

## 2017-02-12 LAB — TSH: TSH: 0.807 m[IU]/L (ref 0.308–3.960)

## 2017-02-12 MED ORDER — PEMBROLIZUMAB CHEMO INJECTION 100 MG/4ML
200.0000 mg | Freq: Once | INTRAVENOUS | Status: AC
  Administered 2017-02-12: 200 mg via INTRAVENOUS
  Filled 2017-02-12: qty 8

## 2017-02-12 MED ORDER — SODIUM CHLORIDE 0.9 % IV SOLN
Freq: Once | INTRAVENOUS | Status: AC
  Administered 2017-02-12: 12:00:00 via INTRAVENOUS

## 2017-02-12 NOTE — Addendum Note (Signed)
Addended by: Betsy Coder B on: 02/12/2017 11:41 AM   Modules accepted: Orders

## 2017-02-12 NOTE — Progress Notes (Signed)
  [place] OFFICE PROGRESS NOTE   Diagnosis: Non-small cell lung cancer  INTERVAL HISTORY:   Brooke [name] returns as scheduled. She completed another treatment with pembrolizumab on 01/24/2017. No rash or diarrhea. She reports mild hoarseness. She is now living at home.  Objective:  Vital signs in last 24 hours:  Blood pressure 109/62, pulse 85, temperature 97.9 F (36.6 C), temperature source Oral, resp. rate 17, height 5' 3.25" (1.607 m), weight 113 lb 3.2 oz (51.3 kg), SpO2 97 %.    HEENT: No thrush Lymphatics: No cervical or supraclavicular nodes Resp: Distant breath sounds, symmetric air movement, no respiratory distress Cardio: Regular rate and rhythm GI: No hepatosplenomegaly Vascular: No leg edema  Skin: No rash   Lab Results:  Lab Results  Component Value Date   WBC 5.3 02/12/2017   HGB 12.4 02/12/2017   HCT 38.1 02/12/2017   MCV 82.8 02/12/2017   PLT 209 02/12/2017   NEUTROABS 3.8 02/12/2017    CMP     Component Value Date/Time   NA 139 02/12/2017 0946   K 4.3 02/12/2017 0946   CL 104 10/31/2016 0326   CO2 25 02/12/2017 0946   GLUCOSE 100 02/12/2017 0946   BUN 11.7 02/12/2017 0946   CREATININE 0.6 02/12/2017 0946   CALCIUM 9.8 02/12/2017 0946   PROT 7.0 02/12/2017 0946   ALBUMIN 3.5 02/12/2017 0946   AST 15 02/12/2017 0946   ALT 13 02/12/2017 0946   ALKPHOS 89 02/12/2017 0946   BILITOT 0.27 02/12/2017 0946   GFRNONAA >60 10/31/2016 0326   GFRAA >60 10/31/2016 0326     Medications: I have reviewed the patient's current medications.  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 Acute respiratory failure secondary to #1  3. Oxygen dependent COPD  4. History of a NSTEMI 2015  5. Right lung pneumonia on chest CT 12/12/2016-treated with Levaquin   Disposition:  [name] appears well. She will complete cycle 4 Pembrolizumab today. She will undergo a restaging chest CT prior to an office visit in 3 weeks. 15 minutes were spent with the patient today. The majority of the time was used for counseling and coordination of care.  Donneta Romberg, MD  02/12/2017  11:07 AM

## 2017-02-12 NOTE — Patient Instructions (Signed)
Pembrolizumab injection  What is this medicine?  PEMBROLIZUMAB (pem broe liz ue mab) is a monoclonal antibody. It is used to treat melanoma, head and neck cancer, Hodgkin lymphoma, non-small cell lung cancer, urothelial cancer, stomach cancer, and cancers that have a certain genetic condition.  This medicine may be used for other purposes; ask your health care provider or pharmacist if you have questions.  COMMON BRAND NAME(S): Keytruda  What should I tell my health care provider before I take this medicine?  They need to know if you have any of these conditions:  -diabetes  -immune system problems  -inflammatory bowel disease  -liver disease  -lung or breathing disease  -lupus  -organ transplant  -an unusual or allergic reaction to pembrolizumab, other medicines, foods, dyes, or preservatives  -pregnant or trying to get pregnant  -breast-feeding  How should I use this medicine?  This medicine is for infusion into a vein. It is given by a health care professional in a hospital or clinic setting.  A special MedGuide will be given to you before each treatment. Be sure to read this information carefully each time.  Talk to your pediatrician regarding the use of this medicine in children. While this drug may be prescribed for selected conditions, precautions do apply.  Overdosage: If you think you have taken too much of this medicine contact a poison control center or emergency room at once.  NOTE: This medicine is only for you. Do not share this medicine with others.  What if I miss a dose?  It is important not to miss your dose. Call your doctor or health care professional if you are unable to keep an appointment.  What may interact with this medicine?  Interactions have not been studied.  Give your health care provider a list of all the medicines, herbs, non-prescription drugs, or dietary supplements you use. Also tell them if you smoke, drink alcohol, or use illegal drugs. Some items may interact with your  medicine.  This list may not describe all possible interactions. Give your health care provider a list of all the medicines, herbs, non-prescription drugs, or dietary supplements you use. Also tell them if you smoke, drink alcohol, or use illegal drugs. Some items may interact with your medicine.  What should I watch for while using this medicine?  Your condition will be monitored carefully while you are receiving this medicine.  You may need blood work done while you are taking this medicine.  Do not become pregnant while taking this medicine or for 4 months after stopping it. Women should inform their doctor if they wish to become pregnant or think they might be pregnant. There is a potential for serious side effects to an unborn child. Talk to your health care professional or pharmacist for more information. Do not breast-feed an infant while taking this medicine or for 4 months after the last dose.  What side effects may I notice from receiving this medicine?  Side effects that you should report to your doctor or health care professional as soon as possible:  -allergic reactions like skin rash, itching or hives, swelling of the face, lips, or tongue  -bloody or black, tarry  -breathing problems  -changes in vision  -chest pain  -chills  -constipation  -cough  -dizziness or feeling faint or lightheaded  -fast or irregular heartbeat  -fever  -flushing  -hair loss  -low blood counts - this medicine may decrease the number of white blood cells, red blood cells   and platelets. You may be at increased risk for infections and bleeding.  -muscle pain  -muscle weakness  -persistent headache  -signs and symptoms of high blood sugar such as dizziness; dry mouth; dry skin; fruity breath; nausea; stomach pain; increased hunger or thirst; increased urination  -signs and symptoms of kidney injury like trouble passing urine or change in the amount of urine  -signs and symptoms of liver injury like dark urine, light-colored  stools, loss of appetite, nausea, right upper belly pain, yellowing of the eyes or skin  -stomach pain  -sweating  -weight loss  Side effects that usually do not require medical attention (report to your doctor or health care professional if they continue or are bothersome):  -decreased appetite  -diarrhea  -tiredness  This list may not describe all possible side effects. Call your doctor for medical advice about side effects. You may report side effects to FDA at 1-800-FDA-1088.  Where should I keep my medicine?  This drug is given in a hospital or clinic and will not be stored at home.  NOTE: This sheet is a summary. It may not cover all possible information. If you have questions about this medicine, talk to your doctor, pharmacist, or health care provider.   2018 Elsevier/Gold Standard (2016-01-31 12:29:36)

## 2017-02-12 NOTE — Telephone Encounter (Signed)
Gave avs and calendar for November  °

## 2017-02-13 NOTE — Addendum Note (Signed)
Addended by: Parke Poisson E on: 02/13/2017 02:19 PM   Modules accepted: Orders

## 2017-03-02 ENCOUNTER — Other Ambulatory Visit: Payer: Self-pay | Admitting: Oncology

## 2017-03-05 ENCOUNTER — Ambulatory Visit (HOSPITAL_COMMUNITY)
Admission: RE | Admit: 2017-03-05 | Discharge: 2017-03-05 | Disposition: A | Discharge status: Home / Self Care | Payer: Medicare Other | Source: Ambulatory Visit | Attending: Oncology | Admitting: Oncology

## 2017-03-05 ENCOUNTER — Encounter (HOSPITAL_COMMUNITY): Payer: Self-pay

## 2017-03-05 DIAGNOSIS — D739 Disease of spleen, unspecified: Secondary | ICD-10-CM | POA: Insufficient documentation

## 2017-03-05 DIAGNOSIS — C3412 Malignant neoplasm of upper lobe, left bronchus or lung: Secondary | ICD-10-CM | POA: Diagnosis present

## 2017-03-05 DIAGNOSIS — J432 Centrilobular emphysema: Secondary | ICD-10-CM | POA: Insufficient documentation

## 2017-03-05 DIAGNOSIS — R590 Localized enlarged lymph nodes: Secondary | ICD-10-CM | POA: Insufficient documentation

## 2017-03-05 MED ORDER — IOPAMIDOL (ISOVUE-300) INJECTION 61%
INTRAVENOUS | Status: AC
  Administered 2017-03-05: 75 mL via INTRAVENOUS
  Filled 2017-03-05: qty 75

## 2017-03-05 MED ORDER — IOPAMIDOL (ISOVUE-300) INJECTION 61%
75.0000 mL | Freq: Once | INTRAVENOUS | Status: AC | PRN
  Administered 2017-03-05: 75 mL via INTRAVENOUS

## 2017-03-07 ENCOUNTER — Ambulatory Visit (HOSPITAL_BASED_OUTPATIENT_CLINIC_OR_DEPARTMENT_OTHER): Payer: Medicare Other | Admitting: Oncology

## 2017-03-07 ENCOUNTER — Telehealth: Payer: Self-pay | Admitting: Oncology

## 2017-03-07 ENCOUNTER — Ambulatory Visit (HOSPITAL_BASED_OUTPATIENT_CLINIC_OR_DEPARTMENT_OTHER): Payer: Medicare Other

## 2017-03-07 VITALS — BP 121/80 | HR 89 | Temp 98.6°F | Resp 19 | Ht 63.25 in | Wt 110.3 lb

## 2017-03-07 DIAGNOSIS — C3412 Malignant neoplasm of upper lobe, left bronchus or lung: Secondary | ICD-10-CM | POA: Diagnosis not present

## 2017-03-07 DIAGNOSIS — J449 Chronic obstructive pulmonary disease, unspecified: Secondary | ICD-10-CM | POA: Diagnosis not present

## 2017-03-07 DIAGNOSIS — Z5112 Encounter for antineoplastic immunotherapy: Secondary | ICD-10-CM | POA: Diagnosis not present

## 2017-03-07 DIAGNOSIS — J9600 Acute respiratory failure, unspecified whether with hypoxia or hypercapnia: Secondary | ICD-10-CM

## 2017-03-07 DIAGNOSIS — Z923 Personal history of irradiation: Secondary | ICD-10-CM

## 2017-03-07 DIAGNOSIS — C7989 Secondary malignant neoplasm of other specified sites: Secondary | ICD-10-CM | POA: Diagnosis not present

## 2017-03-07 MED ORDER — SODIUM CHLORIDE 0.9 % IV SOLN
Freq: Once | INTRAVENOUS | Status: AC
  Administered 2017-03-07: 10:00:00 via INTRAVENOUS

## 2017-03-07 MED ORDER — SODIUM CHLORIDE 0.9 % IV SOLN
200.0000 mg | Freq: Once | INTRAVENOUS | Status: AC
  Administered 2017-03-07: 200 mg via INTRAVENOUS
  Filled 2017-03-07: qty 8

## 2017-03-07 NOTE — Telephone Encounter (Signed)
Scheduled appt per 1/11 los - Gave patient AVS and calender per los.  

## 2017-03-07 NOTE — Progress Notes (Signed)
Per [name]: OK to treat with labs from 02/12/17. No labs needed today for Keytruda. Brett Fairy, Infusion RN made aware.

## 2017-03-07 NOTE — Patient Instructions (Signed)
[facility] Discharge Instructions for Patients Receiving Chemotherapy  Today you received the following chemotherapy agents:  Keytruda.  To help prevent nausea and vomiting after your treatment, we encourage you to take your nausea medication as directed.   If you develop nausea and vomiting that is not controlled by your nausea medication, call the clinic.   BELOW ARE SYMPTOMS THAT SHOULD BE REPORTED IMMEDIATELY:  *FEVER GREATER THAN 100.5 F  *CHILLS WITH OR WITHOUT FEVER  NAUSEA AND VOMITING THAT IS NOT CONTROLLED WITH YOUR NAUSEA MEDICATION  *UNUSUAL SHORTNESS OF BREATH  *UNUSUAL BRUISING OR BLEEDING  TENDERNESS IN MOUTH AND THROAT WITH OR WITHOUT PRESENCE OF ULCERS  *URINARY PROBLEMS  *BOWEL PROBLEMS  UNUSUAL RASH Items with * indicate a potential emergency and should be followed up as soon as possible.  Feel free to call the clinic should you have any questions or concerns. The clinic phone number is [phone].  Please show the CHEMO ALERT CARD at check-in to the Emergency Department and triage nurse.    

## 2017-03-07 NOTE — Progress Notes (Signed)
[place] OFFICE PROGRESS NOTE   Diagnosis: Non-small cell lung cancer  INTERVAL HISTORY:   Brooke [name] returns as scheduled.  She completed another treatment with pembrolizumab on 02/12/2017.  No rash or diarrhea.  She has intermittent discomfort in the anterior and posterior chest.  She continues to have symptoms from COPD, but this has improved.  Objective:  Vital signs in last 24 hours:  Blood pressure 121/80, pulse 89, temperature 98.6 F (37 C), temperature source Oral, resp. rate 19, height 5' 3.25" (1.607 m), weight 110 lb 4.8 oz (50 kg), SpO2 99 %.    HEENT: Thrush Resp: Good air movement bilaterally, no respiratory distress Cardio: Regular rate and rhythm GI: No hepatosplenomegaly Vascular: No leg edema  Skin: No rash   Lab Results:  Lab Results  Component Value Date   WBC 5.3 02/12/2017   HGB 12.4 02/12/2017   HCT 38.1 02/12/2017   MCV 82.8 02/12/2017   PLT 209 02/12/2017   NEUTROABS 3.8 02/12/2017    CMP     Component Value Date/Time   NA 139 02/12/2017 0946   K 4.3 02/12/2017 0946   CL 104 10/31/2016 0326   CO2 25 02/12/2017 0946   GLUCOSE 100 02/12/2017 0946   BUN 11.7 02/12/2017 0946   CREATININE 0.6 02/12/2017 0946   CALCIUM 9.8 02/12/2017 0946   PROT 7.0 02/12/2017 0946   ALBUMIN 3.5 02/12/2017 0946   AST 15 02/12/2017 0946   ALT 13 02/12/2017 0946   ALKPHOS 89 02/12/2017 0946   BILITOT 0.27 02/12/2017 0946   GFRNONAA >60 10/31/2016 0326   GFRAA >60 10/31/2016 0326     Imaging:  Ct Chest W Contrast  Result Date: 03/05/2017 CLINICAL DATA:  Lung cancer. Immunotherapy check point modulator in progress EXAM: CT CHEST WITH CONTRAST TECHNIQUE: Multidetector CT imaging of the chest was performed during intravenous contrast administration. CONTRAST:  75 mL Isovue COMPARISON:  None. FINDINGS: Cardiovascular: Coronary artery calcification and aortic atherosclerotic calcification. Mediastinum/Nodes: No axillary or supraclavicular  adenopathy. Decrease in size of LEFT lower paratracheal lymph node measuring 8 mm in short axis compared to 22 mm . 17  mm nodule LEFT aspect of the thyroid gland is not changed. Lungs/Pleura: No reduction in size of LEFT upper lobe mass measuring 3.1 by 1.5 cm decreased from 3.6 x 2.6 cm. No new nodularity. There is extensive centrilobular emphysema with an upper lobe predominance. RIGHT lung clear. Upper Abdomen: Limited view of the upper abdomen demonstrate cysts in the liver. Mass within the spleen is decreased in size measuring 6.4 x 5.6 cm decreased from 9.4 x 8.3 cm. Previous described nodularity in the gastrosplenic ligament is also improved . Musculoskeletal: No aggressive osseous lesion. IMPRESSION: 1. Decrease in size of LEFT upper lobe mass. 2. Decrease in size of mediastinal lymphadenopathy 3. Decrease in size of splenic mass. Electronically Signed   By: Suzy Bouchard M.D.   On: 03/05/2017 16:16    Medications: I have reviewed the patient's current medications.  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 Acute respiratory failure secondary to #1  3. Oxygen dependent COPD  4. History of a NSTEMI 2015  5. Right lung pneumonia on chest CT 12/12/2016-treated with Levaquin    Disposition:  Brooke [name] appears stable.  She is tolerating the pembrolizumab well.  The restaging CT reveals a response to therapy.  The plan is to continue pembrolizumab.  She will return for an office and lab visit with the next treatment on 04/02/2017.  I reviewed the CT images with Brooke [name] today.  She received an influenza vaccine today.  25 minutes were spent with the patient today.  The majority of the time was used for counseling and coordination of care.  Donneta Romberg, MD  03/07/2017  9:52 AM

## 2017-03-13 ENCOUNTER — Ambulatory Visit: Payer: Medicare Other | Admitting: Internal Medicine

## 2017-03-31 ENCOUNTER — Other Ambulatory Visit: Payer: Self-pay | Admitting: Oncology

## 2017-04-02 ENCOUNTER — Telehealth: Payer: Self-pay | Admitting: Oncology

## 2017-04-02 ENCOUNTER — Ambulatory Visit (HOSPITAL_BASED_OUTPATIENT_CLINIC_OR_DEPARTMENT_OTHER): Payer: Medicare Other

## 2017-04-02 ENCOUNTER — Encounter: Payer: Self-pay | Admitting: Nurse Practitioner

## 2017-04-02 ENCOUNTER — Ambulatory Visit (HOSPITAL_BASED_OUTPATIENT_CLINIC_OR_DEPARTMENT_OTHER): Payer: Medicare Other | Admitting: Nurse Practitioner

## 2017-04-02 ENCOUNTER — Other Ambulatory Visit (HOSPITAL_BASED_OUTPATIENT_CLINIC_OR_DEPARTMENT_OTHER): Payer: Medicare Other

## 2017-04-02 VITALS — BP 139/70 | HR 73 | Temp 97.9°F | Resp 18 | Ht 63.25 in | Wt 114.0 lb

## 2017-04-02 DIAGNOSIS — C7989 Secondary malignant neoplasm of other specified sites: Secondary | ICD-10-CM | POA: Diagnosis not present

## 2017-04-02 DIAGNOSIS — Z79899 Other long term (current) drug therapy: Secondary | ICD-10-CM | POA: Diagnosis not present

## 2017-04-02 DIAGNOSIS — Z923 Personal history of irradiation: Secondary | ICD-10-CM

## 2017-04-02 DIAGNOSIS — Z5112 Encounter for antineoplastic immunotherapy: Secondary | ICD-10-CM

## 2017-04-02 DIAGNOSIS — C3412 Malignant neoplasm of upper lobe, left bronchus or lung: Secondary | ICD-10-CM

## 2017-04-02 LAB — CBC WITH DIFFERENTIAL/PLATELET
BASO%: 0.3 % (ref 0.0–2.0)
BASOS ABS: 0 10*3/uL (ref 0.0–0.1)
EOS ABS: 0.1 10*3/uL (ref 0.0–0.5)
EOS%: 2.2 % (ref 0.0–7.0)
HEMATOCRIT: 39 % (ref 34.8–46.6)
HEMOGLOBIN: 12.5 g/dL (ref 11.6–15.9)
LYMPH#: 0.8 10*3/uL — AB (ref 0.9–3.3)
LYMPH%: 16.1 % (ref 14.0–49.7)
MCH: 25.9 pg (ref 25.1–34.0)
MCHC: 32.1 g/dL (ref 31.5–36.0)
MCV: 80.7 fL (ref 79.5–101.0)
MONO#: 0.4 10*3/uL (ref 0.1–0.9)
MONO%: 6.9 % (ref 0.0–14.0)
NEUT#: 3.9 10*3/uL (ref 1.5–6.5)
NEUT%: 74.5 % (ref 38.4–76.8)
PLATELETS: 225 10*3/uL (ref 145–400)
RBC: 4.83 10*6/uL (ref 3.70–5.45)
RDW: 17.3 % — AB (ref 11.2–14.5)
WBC: 5.2 10*3/uL (ref 3.9–10.3)

## 2017-04-02 LAB — COMPREHENSIVE METABOLIC PANEL
ALT: 18 U/L (ref 0–55)
ANION GAP: 8 meq/L (ref 3–11)
AST: 17 U/L (ref 5–34)
Albumin: 3.2 g/dL — ABNORMAL LOW (ref 3.5–5.0)
Alkaline Phosphatase: 86 U/L (ref 40–150)
BUN: 19.6 mg/dL (ref 7.0–26.0)
CHLORIDE: 106 meq/L (ref 98–109)
CO2: 25 meq/L (ref 22–29)
Calcium: 9.3 mg/dL (ref 8.4–10.4)
Creatinine: 0.6 mg/dL (ref 0.6–1.1)
GLUCOSE: 88 mg/dL (ref 70–140)
POTASSIUM: 4.1 meq/L (ref 3.5–5.1)
SODIUM: 139 meq/L (ref 136–145)
TOTAL PROTEIN: 6.9 g/dL (ref 6.4–8.3)
Total Bilirubin: 0.25 mg/dL (ref 0.20–1.20)

## 2017-04-02 LAB — TSH: TSH: 0.919 m(IU)/L (ref 0.308–3.960)

## 2017-04-02 MED ORDER — SODIUM CHLORIDE 0.9 % IV SOLN
Freq: Once | INTRAVENOUS | Status: AC
  Administered 2017-04-02: 12:00:00 via INTRAVENOUS

## 2017-04-02 MED ORDER — SODIUM CHLORIDE 0.9 % IV SOLN
200.0000 mg | Freq: Once | INTRAVENOUS | Status: AC
  Administered 2017-04-02: 200 mg via INTRAVENOUS
  Filled 2017-04-02: qty 8

## 2017-04-02 NOTE — Patient Instructions (Signed)
[facility] Discharge Instructions for Patients Receiving Chemotherapy  Today you received the following chemotherapy agents: Pembrolizumab [name]  To help prevent nausea and vomiting after your treatment, we encourage you to take your nausea medication as prescribed.  If you develop nausea and vomiting that is not controlled by your nausea medication, call the clinic.   BELOW ARE SYMPTOMS THAT SHOULD BE REPORTED IMMEDIATELY:  *FEVER GREATER THAN 100.5 F  *CHILLS WITH OR WITHOUT FEVER  NAUSEA AND VOMITING THAT IS NOT CONTROLLED WITH YOUR NAUSEA MEDICATION  *UNUSUAL SHORTNESS OF BREATH  *UNUSUAL BRUISING OR BLEEDING  TENDERNESS IN MOUTH AND THROAT WITH OR WITHOUT PRESENCE OF ULCERS  *URINARY PROBLEMS  *BOWEL PROBLEMS  UNUSUAL RASH Items with * indicate a potential emergency and should be followed up as soon as possible.  Feel free to call the clinic should you have any questions or concerns. The clinic phone number is [phone].  Please show the Brooke [name] at check-in to the Emergency Department and triage nurse.

## 2017-04-02 NOTE — Progress Notes (Signed)
  [place] OFFICE PROGRESS NOTE   Diagnosis: Non-small cell lung cancer  INTERVAL HISTORY:   [name] returns as scheduled.  She completed cycle 5 Pembrolizumab 03/07/2017.  She denies nausea/vomiting.  No mouth sores.  No diarrhea.  No rash.  She has periodic indigestion.  She wonders if this is related to "wheat".  She continues to have shortness of breath with exertion.  Objective:  Vital signs in last 24 hours:  Blood pressure 139/70, pulse 73, temperature 97.9 F (36.6 C), temperature source Oral, resp. rate 18, height 5' 3.25" (1.607 m), weight 114 lb (51.7 kg), SpO2 95 %.    HEENT: No thrush or ulcers. Resp: Lungs clear bilaterally. Cardio: Regular rate and rhythm. GI: Abdomen soft and nontender.  No hepatomegaly. Vascular: No leg edema. Skin: No rash.   Lab Results:  Lab Results  Component Value Date   WBC 5.2 04/02/2017   HGB 12.5 04/02/2017   HCT 39.0 04/02/2017   MCV 80.7 04/02/2017   PLT 225 04/02/2017   NEUTROABS 3.9 04/02/2017    Imaging:  No results found.  Medications: I have reviewed the patient's current medications.  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 Acute respiratory failure secondary to #1  3. Oxygen dependent COPD  4. History of a NSTEMI 2015  5. Right lung pneumonia on chest CT 12/12/2016-treated with Levaquin   Disposition: Brooke [name] appears stable.  She has completed 5 cycles of Pembrolizumab.  Plan to proceed with cycle 6 today as scheduled.  She will return for a follow-up visit in 3 weeks.  She will contact the office in the interim with any problems.    [name] ANP/GNP-BC   04/02/2017  11:11 AM

## 2017-04-02 NOTE — Telephone Encounter (Signed)
Gave avs and calendar for December  °

## 2017-04-03 LAB — T4: T4 TOTAL: 6.6 ug/dL (ref 4.5–12.0)

## 2017-04-03 LAB — T3 UPTAKE
FREE THYROXINE INDEX: 1.8 (ref 1.2–4.9)
T3 Uptake Ratio: 28 % (ref 24–39)

## 2017-04-03 LAB — T4, FREE: T4,Free(Direct): 1.14 ng/dL (ref 0.82–1.77)

## 2017-04-25 ENCOUNTER — Ambulatory Visit (HOSPITAL_BASED_OUTPATIENT_CLINIC_OR_DEPARTMENT_OTHER): Payer: Medicare Other

## 2017-04-25 ENCOUNTER — Ambulatory Visit (HOSPITAL_BASED_OUTPATIENT_CLINIC_OR_DEPARTMENT_OTHER): Payer: Medicare Other | Admitting: Oncology

## 2017-04-25 ENCOUNTER — Telehealth: Payer: Self-pay | Admitting: Oncology

## 2017-04-25 VITALS — BP 122/65 | HR 75 | Temp 98.0°F | Resp 20 | Ht 63.25 in | Wt 115.6 lb

## 2017-04-25 DIAGNOSIS — C3412 Malignant neoplasm of upper lobe, left bronchus or lung: Secondary | ICD-10-CM | POA: Diagnosis not present

## 2017-04-25 DIAGNOSIS — Z5112 Encounter for antineoplastic immunotherapy: Secondary | ICD-10-CM | POA: Diagnosis not present

## 2017-04-25 MED ORDER — SODIUM CHLORIDE 0.9 % IV SOLN
Freq: Once | INTRAVENOUS | Status: AC
  Administered 2017-04-25: 10:00:00 via INTRAVENOUS

## 2017-04-25 MED ORDER — SODIUM CHLORIDE 0.9 % IV SOLN
200.0000 mg | Freq: Once | INTRAVENOUS | Status: AC
  Administered 2017-04-25: 200 mg via INTRAVENOUS
  Filled 2017-04-25: qty 8

## 2017-04-25 NOTE — Patient Instructions (Signed)
[facility] Discharge Instructions for Patients Receiving Chemotherapy  Today you received the following chemotherapy agents Beryle Flock   To help prevent nausea and vomiting after your treatment, we encourage you to take your nausea medication as directed  If you develop nausea and vomiting that is not controlled by your nausea medication, call the clinic.   BELOW ARE SYMPTOMS THAT SHOULD BE REPORTED IMMEDIATELY:  *FEVER GREATER THAN 100.5 F  *CHILLS WITH OR WITHOUT FEVER  NAUSEA AND VOMITING THAT IS NOT CONTROLLED WITH YOUR NAUSEA MEDICATION  *UNUSUAL SHORTNESS OF BREATH  *UNUSUAL BRUISING OR BLEEDING  TENDERNESS IN MOUTH AND THROAT WITH OR WITHOUT PRESENCE OF ULCERS  *URINARY PROBLEMS  *BOWEL PROBLEMS  UNUSUAL RASH Items with * indicate a potential emergency and should be followed up as soon as possible.  Feel free to call the clinic you have any questions or concerns. The clinic phone number is [phone].

## 2017-04-25 NOTE — Progress Notes (Signed)
  [place] OFFICE PROGRESS NOTE   Diagnosis: Non-small cell lung cancer  INTERVAL HISTORY:   Ms. Badami returns as scheduled.  She feels well.  No rash or diarrhea.  Her dyspnea has improved.  Good appetite.  Objective:  Vital signs in last 24 hours:  Blood pressure 122/65, pulse 75, temperature 98 F (36.7 C), temperature source Oral, resp. rate 20, height 5' 3.25" (1.607 m), weight 115 lb 9.6 oz (52.4 kg), SpO2 100 %.    HEENT: No thrush or ulcers Resp: Distant breath sounds, clear bilaterally, no respiratory distress Cardio: Regular rate and rhythm GI: No hepatomegaly, nontender Vascular: No leg edema  Skin: Rash  Lab Results:   CMP     Component Value Date/Time   NA 139 04/02/2017 1026   K 4.1 04/02/2017 1026   CL 104 10/31/2016 0326   CO2 25 04/02/2017 1026   GLUCOSE 88 04/02/2017 1026   BUN 19.6 04/02/2017 1026   CREATININE 0.6 04/02/2017 1026   CALCIUM 9.3 04/02/2017 1026   PROT 6.9 04/02/2017 1026   ALBUMIN 3.2 (L) 04/02/2017 1026   AST 17 04/02/2017 1026   ALT 18 04/02/2017 1026   ALKPHOS 86 04/02/2017 1026   BILITOT 0.25 04/02/2017 1026   GFRNONAA >60 10/31/2016 0326   GFRAA >60 10/31/2016 0326    Medications: I have reviewed the patient's current medications.   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  PDL1 60%  Left lung radiation 11/08/2016 through 11/21/2016  CT chest 12/12/2016-reexpansion of left upper lobe with a decreased left upper lobe mass, unchanged mediastinal adenopathy, progression of a  splenic metastasis/pancreatic tail/gastrohepatic ligament metastasis, right lower lobe pneumonia  Cycle 1 Pembrolizumab 12/14/2016  Cycle 2 Pembrolizumab 01/03/2017  Cycle 3 Pembrolizumab 01/24/2017  Cycle 4 Pembrolizumab 02/12/2017  CT 03/05/2018-decrease in left upper lobe mass, mediastinal adenopathy, and splenic mass  Cycle 5 pembrolizumab 03/07/2017  Cycle 6 Pembrolizumab 04/02/2017  Cycle 7 pembrolizumab 04/25/2017  2. Acute respiratory failure secondary to #1  3. Oxygen dependent COPD  4. History of a NSTEMI 2015  5. Right lung pneumonia on chest CT 12/12/2016-treated with Levaquin   Disposition: Ms. Franta appears stable.  She is tolerating the pembrolizumab well.  The plan is to continue pembrolizumab every 3 weeks. She is having difficulty with IV access.  She declines Port-A-Cath placement.  15 minutes were spent with the patient today.  The majority of the time was used for counseling and coordination of care.  Betsy Coder, MD  04/25/2017  10:13 AM

## 2017-04-25 NOTE — Telephone Encounter (Signed)
Scheduled appt per 12/20 los - Gave patient AVS and calender per los.  

## 2017-04-25 NOTE — Progress Notes (Signed)
Ok to treat using labs from 11/27 per [name].  No new labs needed.

## 2017-05-11 ENCOUNTER — Other Ambulatory Visit: Payer: Self-pay | Admitting: Oncology

## 2017-05-14 ENCOUNTER — Encounter: Payer: Self-pay | Admitting: Nurse Practitioner

## 2017-05-14 ENCOUNTER — Inpatient Hospital Stay: Payer: Medicare Other

## 2017-05-14 ENCOUNTER — Inpatient Hospital Stay (HOSPITAL_BASED_OUTPATIENT_CLINIC_OR_DEPARTMENT_OTHER): Payer: Medicare Other | Admitting: Nurse Practitioner

## 2017-05-14 ENCOUNTER — Telehealth: Payer: Self-pay | Admitting: Nurse Practitioner

## 2017-05-14 ENCOUNTER — Inpatient Hospital Stay: Payer: Medicare Other | Attending: Oncology

## 2017-05-14 VITALS — BP 129/70 | HR 71 | Temp 98.4°F | Resp 20 | Ht 63.25 in | Wt 115.9 lb

## 2017-05-14 DIAGNOSIS — J440 Chronic obstructive pulmonary disease with acute lower respiratory infection: Secondary | ICD-10-CM | POA: Insufficient documentation

## 2017-05-14 DIAGNOSIS — Z9981 Dependence on supplemental oxygen: Secondary | ICD-10-CM | POA: Insufficient documentation

## 2017-05-14 DIAGNOSIS — C7889 Secondary malignant neoplasm of other digestive organs: Secondary | ICD-10-CM | POA: Diagnosis not present

## 2017-05-14 DIAGNOSIS — C3412 Malignant neoplasm of upper lobe, left bronchus or lung: Secondary | ICD-10-CM | POA: Insufficient documentation

## 2017-05-14 DIAGNOSIS — Z5112 Encounter for antineoplastic immunotherapy: Secondary | ICD-10-CM | POA: Insufficient documentation

## 2017-05-14 DIAGNOSIS — C7951 Secondary malignant neoplasm of bone: Secondary | ICD-10-CM | POA: Diagnosis not present

## 2017-05-14 LAB — COMPREHENSIVE METABOLIC PANEL
ALBUMIN: 3.7 g/dL (ref 3.5–5.0)
ALT: 37 U/L (ref 0–55)
AST: 27 U/L (ref 5–34)
Alkaline Phosphatase: 95 U/L (ref 40–150)
Anion gap: 6 (ref 3–11)
BILIRUBIN TOTAL: 0.5 mg/dL (ref 0.2–1.2)
BUN: 15 mg/dL (ref 7–26)
CHLORIDE: 105 mmol/L (ref 98–109)
CO2: 27 mmol/L (ref 22–29)
Calcium: 9.7 mg/dL (ref 8.4–10.4)
Creatinine, Ser: 0.68 mg/dL (ref 0.60–1.10)
GFR calc Af Amer: >60 mL/min (ref >60)
GFR calc non Af Amer: >60 mL/min (ref >60)
GLUCOSE: 89 mg/dL (ref 70–140)
POTASSIUM: 4.2 mmol/L (ref 3.3–4.7)
Sodium: 138 mmol/L (ref 136–145)
TOTAL PROTEIN: 7.2 g/dL (ref 6.4–8.3)

## 2017-05-14 MED ORDER — SODIUM CHLORIDE 0.9% FLUSH
10.0000 mL | INTRAVENOUS | Status: DC | PRN
Start: 2017-05-14 — End: 2017-05-14
  Filled 2017-05-14: qty 10

## 2017-05-14 MED ORDER — SODIUM CHLORIDE 0.9 % IV SOLN
Freq: Once | INTRAVENOUS | Status: AC
  Administered 2017-05-14: 14:00:00 via INTRAVENOUS

## 2017-05-14 MED ORDER — HEPARIN SOD (PORK) LOCK FLUSH 100 UNIT/ML IV SOLN
500.0000 [IU] | Freq: Once | INTRAVENOUS | Status: DC | PRN
  Filled 2017-05-14: qty 5

## 2017-05-14 MED ORDER — SODIUM CHLORIDE 0.9 % IV SOLN
200.0000 mg | Freq: Once | INTRAVENOUS | Status: AC
  Administered 2017-05-14: 200 mg via INTRAVENOUS
  Filled 2017-05-14: qty 8

## 2017-05-14 NOTE — Progress Notes (Signed)
Spoke with Dr. Gearldine Shown nurse, Lavella Lemons, regarding no CBC drawn at lab visit today.  Ok to proceed with treatment today based on CMP results only.

## 2017-05-14 NOTE — Patient Instructions (Signed)
[facility] Discharge Instructions for Patients Receiving Chemotherapy  Today you received the following chemotherapy agents:  Keytruda.  To help prevent nausea and vomiting after your treatment, we encourage you to take your nausea medication as directed.   If you develop nausea and vomiting that is not controlled by your nausea medication, call the clinic.   BELOW ARE SYMPTOMS THAT SHOULD BE REPORTED IMMEDIATELY:  *FEVER GREATER THAN 100.5 F  *CHILLS WITH OR WITHOUT FEVER  NAUSEA AND VOMITING THAT IS NOT CONTROLLED WITH YOUR NAUSEA MEDICATION  *UNUSUAL SHORTNESS OF BREATH  *UNUSUAL BRUISING OR BLEEDING  TENDERNESS IN MOUTH AND THROAT WITH OR WITHOUT PRESENCE OF ULCERS  *URINARY PROBLEMS  *BOWEL PROBLEMS  UNUSUAL RASH Items with * indicate a potential emergency and should be followed up as soon as possible.  Feel free to call the clinic should you have any questions or concerns. The clinic phone number is [phone].  Please show the CHEMO ALERT CARD at check-in to the Emergency Department and triage nurse.    

## 2017-05-14 NOTE — Telephone Encounter (Signed)
Scheduled appt per 1/8 los - Gave patient AVS and calender per los. - 1/31 appt - patient is aware of time gap in between infusion and NP-

## 2017-05-14 NOTE — Progress Notes (Signed)
OK to treat with Pembrolizumab without CBC per [name]. Kathlee Nations, Infusion RN made aware.

## 2017-05-14 NOTE — Progress Notes (Addendum)
  [place] OFFICE PROGRESS NOTE   Diagnosis: Non-small cell lung cancer  INTERVAL HISTORY:   Brooke [name] returns as scheduled.  She completed cycle 7 Pembrolizumab 04/25/2017.  She feels well.  No nausea or vomiting.  No mouth sores.  No diarrhea.  No rash.  She utilizes supplemental oxygen as needed with activity.  No shortness of breath at rest.  Objective:  Vital signs in last 24 hours:  Blood pressure 129/70, pulse 71, temperature 98.4 F (36.9 C), temperature source Oral, resp. rate 20, height 5' 3.25" (1.607 m), weight 115 lb 14.4 oz (52.6 kg), SpO2 99 %.    HEENT: No thrush or ulcers. Lymphatics: No palpable cervical or supraclavicular lymph nodes. Resp: Lungs clear bilaterally. Cardio: Regular rate and rhythm. GI: Abdomen soft and nontender.  No hepatomegaly. Vascular: No leg edema.  Calves soft and nontender. Skin: No rash.   Lab Results:  Lab Results  Component Value Date   WBC 5.2 04/02/2017   HGB 12.5 04/02/2017   HCT 39.0 04/02/2017   MCV 80.7 04/02/2017   PLT 225 04/02/2017   NEUTROABS 3.9 04/02/2017    Imaging:  No results found.  Medications: I have reviewed the patient's current medications.  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  PDL1 60%  Left lung radiation 11/08/2016 through 11/21/2016  CT chest 12/12/2016-reexpansion of left upper lobe with a decreased left upper lobe mass, unchanged mediastinal adenopathy, progression of a splenic metastasis/pancreatic tail/gastrohepatic ligament metastasis, right  lower lobe pneumonia  Cycle 1 Pembrolizumab 12/14/2016  Cycle 2 Pembrolizumab 01/03/2017  Cycle 3 Pembrolizumab 01/24/2017  Cycle 4 Pembrolizumab 02/12/2017  CT 03/05/2018-decrease in left upper lobe mass, mediastinal adenopathy, and splenic mass  Cycle 5 pembrolizumab 03/07/2017  Cycle 6 Pembrolizumab 04/02/2017  Cycle 7 pembrolizumab 04/25/2017  Cycle 8 Pembrolizumab 05/14/2017  2. Acute respiratory failure secondary to #1  3. Oxygen dependent COPD  4. History of a NSTEMI 2015  5. Right lung pneumonia on chest CT 12/12/2016-treated with Levaquin    Disposition: Brooke [name] appears stable.  She has completed 7 cycles of Pembrolizumab.  She seems to be tolerating treatment well.  Plan to proceed with cycle 8 today as scheduled.  The plan is for a restaging chest CT after cycle 9.  She will return for a follow-up visit and treatment in 3 weeks.  She will contact the office in the interim with any problems.    [name] ANP/GNP-BC   05/14/2017  12:41 PM

## 2017-06-06 ENCOUNTER — Inpatient Hospital Stay (HOSPITAL_BASED_OUTPATIENT_CLINIC_OR_DEPARTMENT_OTHER): Payer: Medicare Other | Admitting: Nurse Practitioner

## 2017-06-06 ENCOUNTER — Encounter: Payer: Self-pay | Admitting: Nurse Practitioner

## 2017-06-06 ENCOUNTER — Inpatient Hospital Stay: Payer: Medicare Other

## 2017-06-06 ENCOUNTER — Other Ambulatory Visit: Payer: Self-pay

## 2017-06-06 VITALS — BP 140/71 | HR 78 | Temp 97.4°F | Resp 17 | Ht 63.25 in | Wt 120.4 lb

## 2017-06-06 DIAGNOSIS — C3412 Malignant neoplasm of upper lobe, left bronchus or lung: Secondary | ICD-10-CM | POA: Diagnosis not present

## 2017-06-06 DIAGNOSIS — Z9981 Dependence on supplemental oxygen: Secondary | ICD-10-CM | POA: Diagnosis not present

## 2017-06-06 DIAGNOSIS — C7889 Secondary malignant neoplasm of other digestive organs: Secondary | ICD-10-CM

## 2017-06-06 DIAGNOSIS — C7951 Secondary malignant neoplasm of bone: Secondary | ICD-10-CM | POA: Diagnosis not present

## 2017-06-06 DIAGNOSIS — J440 Chronic obstructive pulmonary disease with acute lower respiratory infection: Secondary | ICD-10-CM | POA: Diagnosis not present

## 2017-06-06 DIAGNOSIS — Z5112 Encounter for antineoplastic immunotherapy: Secondary | ICD-10-CM | POA: Diagnosis not present

## 2017-06-06 LAB — CBC WITH DIFFERENTIAL (CANCER CENTER ONLY)
BASOS ABS: 0 10*3/uL (ref 0.0–0.1)
BASOS PCT: 0 %
Eosinophils Absolute: 0.3 10*3/uL (ref 0.0–0.5)
Eosinophils Relative: 6 %
HCT: 41.3 % (ref 34.8–46.6)
Hemoglobin: 13.2 g/dL (ref 11.6–15.9)
LYMPHS PCT: 21 %
Lymphs Abs: 1.2 10*3/uL (ref 0.9–3.3)
MCH: 26.6 pg (ref 25.1–34.0)
MCHC: 32 g/dL (ref 31.5–36.0)
MCV: 83.3 fL (ref 79.5–101.0)
Monocytes Absolute: 0.4 10*3/uL (ref 0.1–0.9)
Monocytes Relative: 7 %
Neutro Abs: 3.7 10*3/uL (ref 1.5–6.5)
Neutrophils Relative %: 66 %
PLATELETS: 233 10*3/uL (ref 145–400)
RBC: 4.96 MIL/uL (ref 3.70–5.45)
RDW: 17.4 % — ABNORMAL HIGH (ref 11.2–14.5)
WBC: 5.6 10*3/uL (ref 3.9–10.3)

## 2017-06-06 LAB — CMP (CANCER CENTER ONLY)
ALT: 27 U/L (ref 0–55)
ANION GAP: 8 (ref 3–11)
AST: 23 U/L (ref 5–34)
Albumin: 3.5 g/dL (ref 3.5–5.0)
Alkaline Phosphatase: 101 U/L (ref 40–150)
BILIRUBIN TOTAL: 0.4 mg/dL (ref 0.2–1.2)
BUN: 16 mg/dL (ref 7–26)
CALCIUM: 9.3 mg/dL (ref 8.4–10.4)
CO2: 26 mmol/L (ref 22–29)
Chloride: 105 mmol/L (ref 98–109)
Creatinine: 0.63 mg/dL (ref 0.60–1.10)
GFR, Est AFR Am: >60 mL/min (ref >60)
Glucose, Bld: 93 mg/dL (ref 70–140)
POTASSIUM: 4.2 mmol/L (ref 3.5–5.1)
Sodium: 139 mmol/L (ref 136–145)
TOTAL PROTEIN: 7 g/dL (ref 6.4–8.3)

## 2017-06-06 MED ORDER — SODIUM CHLORIDE 0.9 % IV SOLN
200.0000 mg | Freq: Once | INTRAVENOUS | Status: AC
  Administered 2017-06-06: 200 mg via INTRAVENOUS
  Filled 2017-06-06: qty 8

## 2017-06-06 MED ORDER — SODIUM CHLORIDE 0.9 % IV SOLN
Freq: Once | INTRAVENOUS | Status: AC
  Administered 2017-06-06: 13:00:00 via INTRAVENOUS

## 2017-06-06 NOTE — Patient Instructions (Signed)
[facility] Discharge Instructions for Patients Receiving Chemotherapy  Today you received the following chemotherapy agents:  Keytruda.  To help prevent nausea and vomiting after your treatment, we encourage you to take your nausea medication as directed.   If you develop nausea and vomiting that is not controlled by your nausea medication, call the clinic.   BELOW ARE SYMPTOMS THAT SHOULD BE REPORTED IMMEDIATELY:  *FEVER GREATER THAN 100.5 F  *CHILLS WITH OR WITHOUT FEVER  NAUSEA AND VOMITING THAT IS NOT CONTROLLED WITH YOUR NAUSEA MEDICATION  *UNUSUAL SHORTNESS OF BREATH  *UNUSUAL BRUISING OR BLEEDING  TENDERNESS IN MOUTH AND THROAT WITH OR WITHOUT PRESENCE OF ULCERS  *URINARY PROBLEMS  *BOWEL PROBLEMS  UNUSUAL RASH Items with * indicate a potential emergency and should be followed up as soon as possible.  Feel free to call the clinic should you have any questions or concerns. The clinic phone number is [phone].  Please show the CHEMO ALERT CARD at check-in to the Emergency Department and triage nurse.    

## 2017-06-06 NOTE — Addendum Note (Signed)
Addended by: Betsy Coder B on: 06/06/2017 12:12 PM   Modules accepted: Orders

## 2017-06-06 NOTE — Progress Notes (Signed)
  [facility] OFFICE PROGRESS NOTE   Diagnosis: Non-small cell lung cancer  INTERVAL HISTORY:   Brooke [name] returns as scheduled.  She completed cycle 8 Pembrolizumab 05/14/2017.  She denies nausea/vomiting.  No mouth sores.  No diarrhea.  No rash.  Good appetite.  She has stable dyspnea on exertion.  She utilizes supplemental oxygen with activity.  She reports following her last CT scan she became cold and had chills.  No other symptoms such as chest pain/tightness, shortness of breath, hives.  Objective:  Vital signs in last 24 hours:  Blood pressure 140/71, pulse 78, temperature (!) 97.4 F (36.3 C), temperature source Oral, resp. rate 17, height 5' 3.25" (1.607 m), weight 120 lb 6.4 oz (54.6 kg), SpO2 99 %.    HEENT: No thrush or ulcers. Resp: Distant breath sounds. Cardio: Regular rate and rhythm. GI: Abdomen soft and nontender.  No hepatomegaly. Vascular: No leg edema.  Skin: No rash.   Lab Results:  Lab Results  Component Value Date   WBC 5.6 06/06/2017   HGB 12.5 04/02/2017   HCT 41.3 06/06/2017   MCV 83.3 06/06/2017   PLT 233 06/06/2017   NEUTROABS 3.7 06/06/2017    Imaging:  No results found.  Medications: I have reviewed the patient's current medications.  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  PDL1 60%  Left lung radiation 11/08/2016 through 11/21/2016  CT chest 12/12/2016-reexpansion of left upper lobe with a decreased left upper lobe mass, unchanged mediastinal adenopathy, progression of a 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, right lower lobe pneumonia  Cycle 1 Pembrolizumab 12/14/2016  Cycle 2 Pembrolizumab 01/03/2017  Cycle 3 Pembrolizumab 01/24/2017  Cycle 4 Pembrolizumab 02/12/2017  CT 03/05/2018-decrease in left upper lobe mass, mediastinal adenopathy, and splenic mass  Cycle 5 pembrolizumab 03/07/2017  Cycle 6 Pembrolizumab 04/02/2017  Cycle 7 pembrolizumab 04/25/2017  Cycle 8 Pembrolizumab 05/14/2017  Cycle 9 Pembrolizumab 06/06/2017  2. Acute respiratory failure secondary to #1  3. Oxygen dependent COPD  4. History of a NSTEMI 2015  5. Right lung pneumonia on chest CT 12/12/2016-treated with Levaquin     Disposition: Brooke [name] appears stable.  She has completed 8 cycles of Pembrolizumab.  Plan to proceed with cycle 9 today as scheduled.  She will undergo a restaging chest CT in approximately 2 weeks.  She will return for a follow-up visit 06/25/2017.  She will contact the office in the interim with any problems.  Plan reviewed with [name].  [name] ANP/GNP-BC   06/06/2017  11:13 AM

## 2017-06-07 LAB — THYROID PANEL WITH TSH
FREE THYROXINE INDEX: 1.9 (ref 1.2–4.9)
T3 Uptake Ratio: 27 % (ref 24–39)
T4 TOTAL: 7.1 ug/dL (ref 4.5–12.0)
TSH: 1.55 u[IU]/mL (ref 0.450–4.500)

## 2017-06-10 ENCOUNTER — Encounter: Payer: Self-pay | Admitting: Internal Medicine

## 2017-06-10 ENCOUNTER — Ambulatory Visit: Payer: Medicare Other | Admitting: Internal Medicine

## 2017-06-10 DIAGNOSIS — K219 Gastro-esophageal reflux disease without esophagitis: Secondary | ICD-10-CM | POA: Diagnosis not present

## 2017-06-10 DIAGNOSIS — J449 Chronic obstructive pulmonary disease, unspecified: Secondary | ICD-10-CM | POA: Diagnosis not present

## 2017-06-10 DIAGNOSIS — J9611 Chronic respiratory failure with hypoxia: Secondary | ICD-10-CM

## 2017-06-10 DIAGNOSIS — R918 Other nonspecific abnormal finding of lung field: Secondary | ICD-10-CM | POA: Diagnosis not present

## 2017-06-10 NOTE — Progress Notes (Signed)
Subjective:    Patient ID: Brooke [name], female   DOB: [birth year],    MRN: [id number]     Brief patient profile:  71 yowf quit smoking 2009 on 02 noct 2012 maint on symbicort 160 since aorund  2014 referred to pulmonary clinic 02/15/2016 by Dr   Kathryne Eriksson for RUL Lung mass dx with Stage IV nsc 11/01/16 in setting of GOLD IV copd    History of Present Illness  02/15/2016 1st [facility] office visit/ [name]  GOLD IV copd/ symb 160 2bid maint  Chief Complaint  Patient presents with  . Pulmonary Consult    referred by Dr. Kathryne Eriksson for COPD.  MMRC2 = can't walk a nl pace on a flat grade s sob but does fine slow and flat eg [employer]/ [employer] on 2lpm  New onset bloody mucus plugs November 11 2015 assoc with wt loss  02 2lpm hs and with walking / rare saba needed rec For cough > mucinex up to 1200 mg every 12 hours as needed Rec:  Fob but refused   11/01/16   CT directed bx >>>  Pos NSC LUL    Diagnosis:   Stage IV NSCLC, Squamous cell carcinoma of the left upper lobe.     Indication for treatment:  palliative       Radiation treatment dates:   11/08/2016 to 11/21/2016  Site/dose:   The Left lung was treated to 30 Gy in 10 fractions at 3 Gy per fraction.   Beams/energy:   3D // 10X, 6X    12/06/2016  f/u ov/[name] re: post hosp f/u -  Now on dulera 200 bid and duoneb Chief Complaint  Patient presents with  . HFU    Breathing is doing well. She is using Duoneb 2 x daily and uses albuterol inhaler 1-2 x per wk on average.   presently in SNF ever since last admit 6/18-29/18 for  Active Problems:   COPD  GOLD IV    Mass of upper lobe of left lung   Chronic respiratory failure with hypoxia (HCC)   COPD exacerbation (HCC)   Hypokalemia   Microcytic anemia   SOB (shortness of breath)   Protein-calorie malnutrition, severe Doe = 2lpm x 153ft with rolling walking  Sleeping ok at < 30 degrees Doe = 2lpm x 110ft with rolling walker/2lpm Sleeping ok at < 30 degrees   rec 02 can be adjusted down and off for saturations above 90%     12/12/16 rx Levaquin for ? RLL pna by oncology based on CT findings  though no clear clinical infection    01/08/2017  f/u ov/[name] re:   GOLD IV copd/  [place] lung ca s/p 10 RT and on [place]  Chief Complaint  Patient presents with  . Follow-up    Pt states going to be discharged from home on 01/09/17. She states her breathing is doing well and no co's.   doe improving at SNF > doing some outdoors walking pushing w/c on 02(puts it in the w/c)  including some inclines  2lpm exertion and hs but not at rest / still using duoneb bid (not prn) and dulera 200 2bid with poor hfa (see copd ap)  rec Symbicort 160 = Dulera 200 Goal is to keep saturation over 90% at rest and with exertion/ continue 02 2lpm at bedtime Plan A = Automatic =  dulera 200 Take 2 puffs first thing in am and then another 2 puffs about 12 hours later.  Work on inhaler technique:   Plan B = Backup Only use your albuterol as a rescue medication  Plan C = Crisis - only use your albuterol (duoneb) nebulizer if you first try Plan B and it fails to help > ok to use the nebulizer up to every 4 hours but if start needing it regularly call for immediate appointment     06/10/2017  f/u ov/[name] re:  GOLD IV copd Chief Complaint  Patient presents with  . Follow-up    Breathing has improved some since her last visit. She is using her neb 2 x daily and she has not used her albuterol inhaler due to cost.  Dyspnea:  MMRC2 = can't walk a nl pace on a flat grade s sob but does fine slow and flat eg shopping leaning on cart on 2lpm  Cough: minimium in am  Sleep: on 2lpm some nasal congestion / humidified / never needs saba at hs but when does use saba always neb now due to cost noted  Having some HB better with prn pepcid   No obvious patterns in day to day or daytime variability or assoc excess/ purulent sputum or mucus plugs or hemoptysis or cp or chest tightness, subjective  wheeze or overt sinus symptoms. No unusual exposure hx or h/o childhood pna/ asthma or knowledge of premature birth.  Sleeping ok flat without nocturnal  or early am exacerbation  of respiratory  c/o's or need for noct saba. Also denies any obvious fluctuation of symptoms with weather or environmental changes or other aggravating or alleviating factors except as outlined above   Current Allergies, Complete Past Medical History, Past Surgical History, Family History, and Social History were reviewed in [employer] record.  ROS  The following are not active complaints unless bolded Hoarseness, sore throat, dysphagia, dental problems, itching, sneezing,  nasal congestion or discharge of excess mucus or purulent secretions, ear ache,   fever, chills, sweats, unintended wt loss or wt gain, classically pleuritic or exertional cp,  orthopnea pnd or leg swelling, presyncope, palpitations, abdominal pain, anorexia, nausea, vomiting, diarrhea  or change in bowel habits or change in bladder habits, change in stools or change in urine, dysuria, hematuria,  rash, arthralgias, visual complaints, headache, numbness, weakness or ataxia or problems with walking or coordination,  change in mood/affect or memory.        Current Meds  Medication Sig  . acetaminophen (TYLENOL) 325 MG tablet Per bottle as needed  . albuterol (PROVENTIL HFA;VENTOLIN HFA) 108 (90 BASE) MCG/ACT inhaler Inhale 2 puffs into the lungs every 4 (four) hours as needed for wheezing or shortness of breath. **PLAN B**  . ALPRAZolam (XANAX) 0.25 MG tablet 1/2-1 twice daily as needed  . atorvastatin (LIPITOR) 20 MG tablet Take 1 tablet (20 mg total) by mouth daily.  . budesonide-formoterol (SYMBICORT) 160-4.5 MCG/ACT inhaler Inhale 2 puffs into the lungs 2 (two) times daily.  Marland Kitchen guaiFENesin (MUCINEX) 600 MG 12 hr tablet Take 600 mg by mouth 2 (two) times daily as needed for cough or to loosen phlegm.   Marland Kitchen ibuprofen (ADVIL,MOTRIN)  600 MG tablet Take 600 mg by mouth every 6 (six) hours as needed.  Marland Kitchen ipratropium-albuterol (DUONEB) 0.5-2.5 (3) MG/3ML SOLN Take 3 mLs by nebulization every 4 (four) hours as needed (wheezing/shortness of breath). **PLAN C**  . mometasone-formoterol (DULERA) 200-5 MCG/ACT AERO Inhale into the lungs.  . nitroGLYCERIN (NITROSTAT) 0.4 MG SL tablet ONE TABLET UNDER TONGUE AS NEEDED FOR CHEST PAIN EVERY  5 MINUTES FOR 3 DOSES  . OXYGEN 2lpm 24/7  . Simethicone (GAS RELIEF) 180 MG CAPS Use as needed                  Objective:   Physical Exam  amb thin wf nad/ brighter affect than in past  06/10/2017         119  01/08/2017         112   12/06/2016        110   02/15/16 119 lb (54 kg)  08/18/15 137 lb 12.8 oz (62.5 kg)  02/17/15 138 lb 1.9 oz (62.7 kg)    Vital signs reviewed - Note on arrival 02 sats  99% on  RA     HEENT: nl dentition, turbinates bilaterally, and oropharynx. Nl external ear canals without cough reflex   NECK :  without JVD/Nodes/TM/ nl carotid upstrokes bilaterally   LUNGS: no acc muscle use, barrel contour chest with distant bs bilaterally without wheezing or cough on insp or exp maneuvers   CV:  RRR  no s3 or murmur or increase in P2, and no edema   ABD:  soft and nontender with nl inspiratory excursion in the supine position. No bruits or organomegaly appreciated, bowel sounds nl  MS:  Nl gait/ ext warm without deformities, calf tenderness, cyanosis or clubbing No obvious joint restrictions   SKIN: warm and dry without lesions    NEURO:  alert, approp, nl sensorium with  no motor or cerebellar deficits apparent.                 Assessment:

## 2017-06-10 NOTE — Patient Instructions (Addendum)
Work on inhaler technique:  relax and gently blow all the way out then take a nice smooth deep breath back in, triggering the inhaler at same time you start breathing in.  Hold for up to 5 seconds if you can. Blow out thru nose. Rinse and gargle with water when done  For acid indigestion > pepcid 20 mg up to 12 hours as needed  GERD (REFLUX)  is an extremely common cause of respiratory symptoms just like yours , many times with no obvious heartburn at all.    It can be treated with medication, but also with lifestyle changes including elevation of the head of your bed (ideally with 6 inch  bed blocks),  Smoking cessation, avoidance of late meals, excessive alcohol, and avoid fatty foods, chocolate, peppermint, colas, red wine, and acidic juices such as orange juice.  NO MINT OR MENTHOL PRODUCTS SO NO COUGH DROPS  USE SUGARLESS CANDY INSTEAD (Jolley ranchers or Stover's or Life Savers) or even ice chips will also do - the key is to swallow to prevent all throat clearing. NO OIL BASED VITAMINS - use powdered substitutes.      See calendar for specific medication instructions and bring it back for each and every office visit for every healthcare provider you see.  Without it,  you may not receive the best quality medical care that we feel you deserve.  You will note that the calendar groups together  your maintenance  medications that are timed at particular times of the day.  Think of this as your checklist for what your doctor has instructed you to do until your next evaluation to see what benefit  there is  to staying on a consistent group of medications intended to keep you well.  The other group at the bottom is entirely up to you to use as you see fit  for specific symptoms that may arise between visits that require you to treat them on an as needed basis.  Think of this as your action plan or "what if" list.   Separating the top medications from the bottom group is fundamental to providing you  adequate care going forward.     Please schedule a follow up visit in 6  months but call sooner if needed

## 2017-06-11 ENCOUNTER — Encounter: Payer: Self-pay | Admitting: Internal Medicine

## 2017-06-12 ENCOUNTER — Encounter: Payer: Self-pay | Admitting: Internal Medicine

## 2017-06-12 NOTE — Assessment & Plan Note (Signed)
Spirometry 02/15/2016  FEV1 0.60 (28%)  Ratio 38 with classic curvature  p am symbicort 160  - 01/08/2017    continue dulera 200 2bid  - 06/10/2017  After extensive coaching inhaler device  effectiveness =    75% (short Ti)   Despite suboptimal hfa and very severe baseline airflow obst > Adequate control on present rx, reviewed in detail with pt > no change in rx needed

## 2017-06-12 NOTE — Assessment & Plan Note (Addendum)
On 2lpm hs and does not appear to really need it in daytime, esp at rest  > titrate to keep > 90% with activity   I had an extended discussion with the patient reviewing all relevant studies completed to date and  lasting 15 to 20 minutes of a 25 minute visit    Each maintenance medication was reviewed in detail including most importantly the difference between maintenance and prns and under what circumstances the prns are to be triggered using an action plan format that is not reflected in the computer generated alphabetically organized AVS but trather by a customized med calendar that reflects the AVS meds with confirmed 100% correlation.   In addition, Please see AVS for unique instructions that I personally wrote and verbalized to the the Brooke [name] in detail and then reviewed with Brooke [name]  by my nurse highlighting any  changes in therapy recommended at today's visit to their plan of care.

## 2017-06-12 NOTE — Assessment & Plan Note (Signed)
Ok to use pepcid for now up to 20 mg bid prn

## 2017-06-12 NOTE — Assessment & Plan Note (Signed)
See CT Chest 01/26/16 c/w ? Stage  lung ca arising in LUL - PET 02/28/16 1. Large lobular hypermetabolic mass in the LEFT upper lobe abutting the oblique fissure consists with primary bronchogenic carcinoma. 2. Evidence of ipsilateral hypermetabolic mediastinal nodal Metastasis only > 02/29/2016  referred for EBUS/ Dr Lake Bells > declined  -11/01/16   CT directed bx  [facility] [place] [facility]  > RT palliative completed 11/21/16 / rx per oncology = Lucas Mallow Beryle Flock well > f/u oncology

## 2017-06-20 ENCOUNTER — Ambulatory Visit (HOSPITAL_COMMUNITY)
Admission: RE | Admit: 2017-06-20 | Discharge: 2017-06-20 | Disposition: A | Discharge status: Home / Self Care | Payer: Medicare Other | Source: Ambulatory Visit | Attending: Nurse Practitioner | Admitting: Nurse Practitioner

## 2017-06-20 DIAGNOSIS — C7889 Secondary malignant neoplasm of other digestive organs: Secondary | ICD-10-CM | POA: Diagnosis not present

## 2017-06-20 DIAGNOSIS — I700 Atherosclerosis of aorta: Secondary | ICD-10-CM | POA: Insufficient documentation

## 2017-06-20 DIAGNOSIS — J439 Emphysema, unspecified: Secondary | ICD-10-CM | POA: Insufficient documentation

## 2017-06-20 DIAGNOSIS — I313 Pericardial effusion (noninflammatory): Secondary | ICD-10-CM | POA: Insufficient documentation

## 2017-06-20 DIAGNOSIS — I2510 Atherosclerotic heart disease of native coronary artery without angina pectoris: Secondary | ICD-10-CM | POA: Diagnosis not present

## 2017-06-20 DIAGNOSIS — C3412 Malignant neoplasm of upper lobe, left bronchus or lung: Secondary | ICD-10-CM | POA: Insufficient documentation

## 2017-06-20 MED ORDER — IOPAMIDOL (ISOVUE-300) INJECTION 61%
75.0000 mL | Freq: Once | INTRAVENOUS | Status: AC | PRN
  Administered 2017-06-20: 75 mL via INTRAVENOUS

## 2017-06-20 MED ORDER — IOPAMIDOL (ISOVUE-300) INJECTION 61%
INTRAVENOUS | Status: AC
  Filled 2017-06-20: qty 75

## 2017-06-23 ENCOUNTER — Other Ambulatory Visit: Payer: Self-pay | Admitting: Oncology

## 2017-06-25 ENCOUNTER — Inpatient Hospital Stay: Payer: Medicare Other | Attending: Oncology

## 2017-06-25 ENCOUNTER — Inpatient Hospital Stay: Payer: Medicare Other

## 2017-06-25 ENCOUNTER — Telehealth: Payer: Self-pay

## 2017-06-25 ENCOUNTER — Encounter: Payer: Self-pay | Admitting: Oncology

## 2017-06-25 ENCOUNTER — Telehealth: Payer: Self-pay | Admitting: Oncology

## 2017-06-25 ENCOUNTER — Inpatient Hospital Stay (HOSPITAL_BASED_OUTPATIENT_CLINIC_OR_DEPARTMENT_OTHER): Payer: Medicare Other | Admitting: Oncology

## 2017-06-25 VITALS — BP 103/67 | HR 80 | Temp 98.0°F | Resp 18 | Ht 63.25 in | Wt 119.3 lb

## 2017-06-25 DIAGNOSIS — C7889 Secondary malignant neoplasm of other digestive organs: Secondary | ICD-10-CM | POA: Insufficient documentation

## 2017-06-25 DIAGNOSIS — Z5112 Encounter for antineoplastic immunotherapy: Secondary | ICD-10-CM | POA: Diagnosis not present

## 2017-06-25 DIAGNOSIS — C3412 Malignant neoplasm of upper lobe, left bronchus or lung: Secondary | ICD-10-CM

## 2017-06-25 DIAGNOSIS — Z9981 Dependence on supplemental oxygen: Secondary | ICD-10-CM

## 2017-06-25 DIAGNOSIS — J440 Chronic obstructive pulmonary disease with acute lower respiratory infection: Secondary | ICD-10-CM | POA: Insufficient documentation

## 2017-06-25 DIAGNOSIS — I252 Old myocardial infarction: Secondary | ICD-10-CM

## 2017-06-25 LAB — TSH: TSH: 1.099 u[IU]/mL (ref 0.308–3.960)

## 2017-06-25 MED ORDER — SODIUM CHLORIDE 0.9 % IV SOLN
Freq: Once | INTRAVENOUS | Status: AC
  Administered 2017-06-25: 12:00:00 via INTRAVENOUS

## 2017-06-25 MED ORDER — SODIUM CHLORIDE 0.9 % IV SOLN
200.0000 mg | Freq: Once | INTRAVENOUS | Status: AC
  Administered 2017-06-25: 200 mg via INTRAVENOUS
  Filled 2017-06-25: qty 8

## 2017-06-25 NOTE — Patient Instructions (Signed)
[facility] Discharge Instructions for Patients Receiving Chemotherapy  Today you received the following chemotherapy agents:  Keytruda.  To help prevent nausea and vomiting after your treatment, we encourage you to take your nausea medication as directed.   If you develop nausea and vomiting that is not controlled by your nausea medication, call the clinic.   BELOW ARE SYMPTOMS THAT SHOULD BE REPORTED IMMEDIATELY:  *FEVER GREATER THAN 100.5 F  *CHILLS WITH OR WITHOUT FEVER  NAUSEA AND VOMITING THAT IS NOT CONTROLLED WITH YOUR NAUSEA MEDICATION  *UNUSUAL SHORTNESS OF BREATH  *UNUSUAL BRUISING OR BLEEDING  TENDERNESS IN MOUTH AND THROAT WITH OR WITHOUT PRESENCE OF ULCERS  *URINARY PROBLEMS  *BOWEL PROBLEMS  UNUSUAL RASH Items with * indicate a potential emergency and should be followed up as soon as possible.  Feel free to call the clinic should you have any questions or concerns. The clinic phone number is [phone].  Please show the CHEMO ALERT CARD at check-in to the Emergency Department and triage nurse.    

## 2017-06-25 NOTE — Telephone Encounter (Signed)
Pt ok to treat with last labs from 1/31 per [name]. Informed lab.

## 2017-06-25 NOTE — Progress Notes (Signed)
[place] OFFICE PROGRESS NOTE   Diagnosis: Non-small cell lung cancer  INTERVAL HISTORY:   Ms. Stantz returns as scheduled.  She completed another treatment with pembrolizumab 06/06/2017.  She reports recent "gas "pains in the abdomen.  This has improved.  No diarrhea.  Good appetite.  Stable dyspnea.  Objective:  Vital signs in last 24 hours:  Blood pressure 103/67, pulse 80, temperature 98 F (36.7 C), temperature source Oral, resp. rate 18, height 5' 3.25" (1.607 m), weight 119 lb 4.8 oz (54.1 kg), SpO2 100 %.    HEENT: No thrush Resp: Distant breath sounds, no respiratory distress Cardio: Regular rate and rhythm GI: No hepatosplenomegaly, nontender, no mass Vascular: No leg edema  Skin: No rash   Lab Results:  Lab Results  Component Value Date   WBC 5.6 06/06/2017   HGB 12.5 04/02/2017   HCT 41.3 06/06/2017   MCV 83.3 06/06/2017   PLT 233 06/06/2017   NEUTROABS 3.7 06/06/2017    CMP     Component Value Date/Time   NA 139 06/06/2017 1003   NA 139 04/02/2017 1026   K 4.2 06/06/2017 1003   K 4.1 04/02/2017 1026   CL 105 06/06/2017 1003   CO2 26 06/06/2017 1003   CO2 25 04/02/2017 1026   GLUCOSE 93 06/06/2017 1003   GLUCOSE 88 04/02/2017 1026   BUN 16 06/06/2017 1003   BUN 19.6 04/02/2017 1026   CREATININE 0.63 06/06/2017 1003   CREATININE 0.6 04/02/2017 1026   CALCIUM 9.3 06/06/2017 1003   CALCIUM 9.3 04/02/2017 1026   PROT 7.0 06/06/2017 1003   PROT 6.9 04/02/2017 1026   ALBUMIN 3.5 06/06/2017 1003   ALBUMIN 3.2 (L) 04/02/2017 1026   AST 23 06/06/2017 1003   AST 17 04/02/2017 1026   ALT 27 06/06/2017 1003   ALT 18 04/02/2017 1026   ALKPHOS 101 06/06/2017 1003   ALKPHOS 86 04/02/2017 1026   BILITOT 0.4 06/06/2017 1003   BILITOT 0.25 04/02/2017 1026   GFRNONAA >60 06/06/2017 1003   GFRAA >60 06/06/2017 1003    No results found for: CEA1  Lab Results  Component Value Date   INR 0.99 11/01/2016    Imaging:  Medications:  I have reviewed the patient's current medications.   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  PDL1 60%  Left lung radiation 11/08/2016 through 11/21/2016  CT chest 12/12/2016-reexpansion of left upper lobe with a decreased left upper lobe mass, unchanged mediastinal adenopathy, progression of a splenic metastasis/pancreatic tail/gastrohepatic ligament metastasis, right lower lobe pneumonia  Cycle 1 Pembrolizumab 12/14/2016  Cycle 2 Pembrolizumab 01/03/2017  Cycle 3 Pembrolizumab 01/24/2017  Cycle 4 Pembrolizumab 02/12/2017  CT 03/05/2018-decrease in left upper lobe mass, mediastinal adenopathy, and splenic mass  Cycle 5 pembrolizumab 03/07/2017  Cycle 6 Pembrolizumab 04/02/2017  Cycle 7 pembrolizumab 04/25/2017  Cycle 8 Pembrolizumab 05/14/2017  Cycle 9 Pembrolizumab 06/06/2017  CT 06/20/2017-mild decrease in left upper lobe mass, mild increase in paramediastinal left upper lobe nodule-radiation change?, decreased splenic metastasis  Cycle 10 pembrolizumab 06/25/2017  2. Acute respiratory failure secondary to #1  3. Oxygen dependent COPD  4. History of a NSTEMI 2015  5.  Right lung pneumonia on chest CT 12/12/2016-treated with Levaquin   Disposition: Ms. Adelstein appears stable.  The restaging CT reveals overall stable to improved disease.  The plan is to continue pembrolizumab.  She appears to be tolerating the pembrolizumab well. We will check a laboratory panel when she is here for treatment in April.  I reviewed the CT images.  The small  nodular area at the left mediastinum may be a benign or malignant lymph node.  We will follow this area on future images.  25 minutes were spent with the patient today.  The majority of the time was used for counseling and coordination of care.  Betsy Coder, MD  06/25/2017  11:35 AM

## 2017-06-25 NOTE — Telephone Encounter (Signed)
Scheduled appt per 2/19 los - Gave patient AVS and calender per los

## 2017-07-16 ENCOUNTER — Telehealth: Payer: Self-pay

## 2017-07-16 NOTE — Telephone Encounter (Signed)
Returned call to patient about rash. Patient reported rash mid upper arm and lower arms BI and on "front of legs" BI. Patient reports that the rash has been present "since Tuesday or Wednesday". Patient states "its a little less itchy today, ive been putting hydrocortisone cream on it". Brooke Ford, NP aware. Per [name], pt can be seen this afternoon or at scheduled appt on Thursday if rash is improving on its own. Pt prefers to wait until Thursday and knows to call back with any worsening or concerning symptoms.

## 2017-07-18 ENCOUNTER — Inpatient Hospital Stay: Payer: Medicare Other | Attending: Oncology

## 2017-07-18 ENCOUNTER — Inpatient Hospital Stay (HOSPITAL_BASED_OUTPATIENT_CLINIC_OR_DEPARTMENT_OTHER): Payer: Medicare Other | Admitting: Oncology

## 2017-07-18 ENCOUNTER — Telehealth: Payer: Self-pay

## 2017-07-18 VITALS — BP 135/67 | HR 82 | Temp 98.1°F | Resp 17 | Ht 63.25 in | Wt 119.1 lb

## 2017-07-18 DIAGNOSIS — R21 Rash and other nonspecific skin eruption: Secondary | ICD-10-CM | POA: Insufficient documentation

## 2017-07-18 DIAGNOSIS — Z9981 Dependence on supplemental oxygen: Secondary | ICD-10-CM | POA: Insufficient documentation

## 2017-07-18 DIAGNOSIS — J440 Chronic obstructive pulmonary disease with acute lower respiratory infection: Secondary | ICD-10-CM

## 2017-07-18 DIAGNOSIS — C7889 Secondary malignant neoplasm of other digestive organs: Secondary | ICD-10-CM

## 2017-07-18 DIAGNOSIS — C3412 Malignant neoplasm of upper lobe, left bronchus or lung: Secondary | ICD-10-CM | POA: Insufficient documentation

## 2017-07-18 DIAGNOSIS — Z5112 Encounter for antineoplastic immunotherapy: Secondary | ICD-10-CM | POA: Diagnosis not present

## 2017-07-18 MED ORDER — HEPARIN SOD (PORK) LOCK FLUSH 100 UNIT/ML IV SOLN
500.0000 [IU] | Freq: Once | INTRAVENOUS | Status: DC | PRN
  Filled 2017-07-18: qty 5

## 2017-07-18 MED ORDER — SODIUM CHLORIDE 0.9% FLUSH
10.0000 mL | INTRAVENOUS | Status: DC | PRN
  Filled 2017-07-18: qty 10

## 2017-07-18 MED ORDER — SODIUM CHLORIDE 0.9 % IV SOLN
200.0000 mg | Freq: Once | INTRAVENOUS | Status: AC
  Administered 2017-07-18: 200 mg via INTRAVENOUS
  Filled 2017-07-18: qty 8

## 2017-07-18 MED ORDER — SODIUM CHLORIDE 0.9 % IV SOLN: Freq: Once | INTRAVENOUS | Status: DC

## 2017-07-18 NOTE — Progress Notes (Signed)
Ok to treat today with no labs per [name].

## 2017-07-18 NOTE — Telephone Encounter (Signed)
Printed avs and calender of upcoming appointment. Per 3/14 los

## 2017-07-18 NOTE — Patient Instructions (Signed)
[facility] Discharge Instructions for Patients Receiving Chemotherapy  Today you received the following chemotherapy agents:  Keytruda.  To help prevent nausea and vomiting after your treatment, we encourage you to take your nausea medication as directed.   If you develop nausea and vomiting that is not controlled by your nausea medication, call the clinic.   BELOW ARE SYMPTOMS THAT SHOULD BE REPORTED IMMEDIATELY:  *FEVER GREATER THAN 100.5 F  *CHILLS WITH OR WITHOUT FEVER  NAUSEA AND VOMITING THAT IS NOT CONTROLLED WITH YOUR NAUSEA MEDICATION  *UNUSUAL SHORTNESS OF BREATH  *UNUSUAL BRUISING OR BLEEDING  TENDERNESS IN MOUTH AND THROAT WITH OR WITHOUT PRESENCE OF ULCERS  *URINARY PROBLEMS  *BOWEL PROBLEMS  UNUSUAL RASH Items with * indicate a potential emergency and should be followed up as soon as possible.  Feel free to call the clinic should you have any questions or concerns. The clinic phone number is [phone].  Please show the CHEMO ALERT CARD at check-in to the Emergency Department and triage nurse.    

## 2017-07-18 NOTE — Progress Notes (Signed)
[place] OFFICE PROGRESS NOTE   Diagnosis: Non-small cell lung cancer  INTERVAL HISTORY:   Brooke [name] returns as scheduled.  She completed another treatment with pembrolizumab on 06/25/2017.  She reports a "rash" over the arms and legs for the past few weeks.  This is chiefly over the extensor surface where she "scrubs "when bathing.  She recently changed soap.  No diarrhea.  Objective:  Vital signs in last 24 hours:  Blood pressure 135/67, pulse 82, temperature 98.1 F (36.7 C), temperature source Oral, resp. rate 17, height 5' 3.25" (1.607 m), weight 119 lb 1.6 oz (54 kg), SpO2 96 %.    HEENT: No thrush or ulcers Resp: Lungs clear bilaterally, no respiratory distress, bronchial sounds at the left upper posterior chest Cardio: Regular rate and rhythm GI: Hepatosplenomegaly Vascular: No leg edema  Skin: Few areas of maculopapular erythematous rash over the distal arms and extensor surface of the legs.  No vesicles.  Portacath/PICC-without erythema  Lab Results:  Lab Results  Component Value Date   WBC 5.6 06/06/2017   HGB 12.5 04/02/2017   HCT 41.3 06/06/2017   MCV 83.3 06/06/2017   PLT 233 06/06/2017   NEUTROABS 3.7 06/06/2017    CMP     Component Value Date/Time   NA 139 06/06/2017 1003   NA 139 04/02/2017 1026   K 4.2 06/06/2017 1003   K 4.1 04/02/2017 1026   CL 105 06/06/2017 1003   CO2 26 06/06/2017 1003   CO2 25 04/02/2017 1026   GLUCOSE 93 06/06/2017 1003   GLUCOSE 88 04/02/2017 1026   BUN 16 06/06/2017 1003   BUN 19.6 04/02/2017 1026   CREATININE 0.63 06/06/2017 1003   CREATININE 0.6 04/02/2017 1026   CALCIUM 9.3 06/06/2017 1003   CALCIUM 9.3 04/02/2017 1026   PROT 7.0 06/06/2017 1003   PROT 6.9 04/02/2017 1026   ALBUMIN 3.5 06/06/2017 1003   ALBUMIN 3.2 (L) 04/02/2017 1026   AST 23 06/06/2017 1003   AST 17 04/02/2017 1026   ALT 27 06/06/2017 1003   ALT 18 04/02/2017 1026   ALKPHOS 101 06/06/2017 1003   ALKPHOS 86 04/02/2017  1026   BILITOT 0.4 06/06/2017 1003   BILITOT 0.25 04/02/2017 1026   GFRNONAA >60 06/06/2017 1003   GFRAA >60 06/06/2017 1003     Medications: I have reviewed the patient's current medications.   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  PDL1 60%  Left lung radiation 11/08/2016 through 11/21/2016  CT chest 12/12/2016-reexpansion of left upper lobe with a decreased left upper lobe mass, unchanged mediastinal adenopathy, progression of a splenic metastasis/pancreatic tail/gastrohepatic ligament metastasis, right lower lobe pneumonia  Cycle 1 Pembrolizumab 12/14/2016  Cycle 2 Pembrolizumab 01/03/2017  Cycle 3 Pembrolizumab 01/24/2017  Cycle 4 Pembrolizumab 02/12/2017  CT 03/05/2018-decrease in left upper lobe mass, mediastinal adenopathy, and splenic mass  Cycle 5 pembrolizumab 03/07/2017  Cycle 6 Pembrolizumab 04/02/2017  Cycle 7 pembrolizumab 04/25/2017  Cycle 8 Pembrolizumab 05/14/2017  Cycle 9 Pembrolizumab 06/06/2017  CT 06/20/2017-mild decrease in left upper lobe mass, mild increase in paramediastinal left upper lobe nodule-radiation change?, decreased splenic metastasis  Cycle 10 pembrolizumab 06/25/2017  Cycle 11  pembrolizumab 07/18/2017  2. Acute respiratory failure secondary to #1  3. Oxygen dependent COPD  4. History of a NSTEMI 2015  5. Right lung pneumonia on chest CT 12/12/2016-treated with Levaquin    Disposition: Brooke [name] appears well.  She has developed a mild erythematous rash at the extensor surface of the lower arms  and the legs.  This may be related to a contact dermatitis, irritation from scrubbing, or due to the pembrolizumab.  She will contact us if the rash worsens.  Brooke [name] will complete another treatment with pembrolizumab today.  She will return for an office visit in 3 weeks.  15 minutes were spent with the patient today.  The majority of the time was used for counseling and coordination of care.  Betsy Coder, MD  07/18/2017  11:05 AM

## 2017-08-04 ENCOUNTER — Other Ambulatory Visit: Payer: Self-pay | Admitting: Oncology

## 2017-08-06 ENCOUNTER — Telehealth: Payer: Self-pay

## 2017-08-06 ENCOUNTER — Inpatient Hospital Stay: Payer: Medicare Other

## 2017-08-06 ENCOUNTER — Encounter: Payer: Self-pay | Admitting: Nurse Practitioner

## 2017-08-06 ENCOUNTER — Inpatient Hospital Stay: Payer: Medicare Other | Attending: Oncology | Admitting: Nurse Practitioner

## 2017-08-06 VITALS — BP 138/67 | HR 76 | Temp 97.9°F | Resp 17 | Ht 63.25 in | Wt 122.2 lb

## 2017-08-06 DIAGNOSIS — C3412 Malignant neoplasm of upper lobe, left bronchus or lung: Secondary | ICD-10-CM

## 2017-08-06 DIAGNOSIS — Z5112 Encounter for antineoplastic immunotherapy: Secondary | ICD-10-CM | POA: Diagnosis not present

## 2017-08-06 DIAGNOSIS — R21 Rash and other nonspecific skin eruption: Secondary | ICD-10-CM | POA: Diagnosis not present

## 2017-08-06 DIAGNOSIS — Z9981 Dependence on supplemental oxygen: Secondary | ICD-10-CM | POA: Diagnosis not present

## 2017-08-06 DIAGNOSIS — I252 Old myocardial infarction: Secondary | ICD-10-CM | POA: Insufficient documentation

## 2017-08-06 DIAGNOSIS — J440 Chronic obstructive pulmonary disease with acute lower respiratory infection: Secondary | ICD-10-CM | POA: Diagnosis not present

## 2017-08-06 DIAGNOSIS — C7889 Secondary malignant neoplasm of other digestive organs: Secondary | ICD-10-CM | POA: Insufficient documentation

## 2017-08-06 LAB — CMP (CANCER CENTER ONLY)
ALBUMIN: 3.4 g/dL — AB (ref 3.5–5.0)
ALK PHOS: 83 U/L (ref 40–150)
ALT: 17 U/L (ref 0–55)
AST: 17 U/L (ref 5–34)
Anion gap: 6 (ref 3–11)
BILIRUBIN TOTAL: 0.3 mg/dL (ref 0.2–1.2)
BUN: 16 mg/dL (ref 7–26)
CO2: 28 mmol/L (ref 22–29)
Calcium: 9.5 mg/dL (ref 8.4–10.4)
Chloride: 106 mmol/L (ref 98–109)
Creatinine: 0.67 mg/dL (ref 0.60–1.10)
GFR, Est AFR Am: >60 mL/min (ref >60)
GFR, Estimated: >60 mL/min (ref >60)
GLUCOSE: 82 mg/dL (ref 70–140)
POTASSIUM: 4.5 mmol/L (ref 3.5–5.1)
Sodium: 140 mmol/L (ref 136–145)
TOTAL PROTEIN: 6.9 g/dL (ref 6.4–8.3)

## 2017-08-06 NOTE — Progress Notes (Addendum)
[place] OFFICE PROGRESS NOTE   Diagnosis: Non-small cell lung cancer  INTERVAL HISTORY:   Brooke [name] returns as scheduled.  She completed another cycle of Pembrolizumab 07/18/2017.  She thinks the rash is worse on her arms and legs.  She has noted a few areas of rash on her face.  The rash continues to be pruritic.  She has applied Aspercreme and some lotions.  She denies nausea/vomiting.  No mouth sores.  No diarrhea.  She has periodic "gas".  She takes Gas-X with good relief.  Objective:  Vital signs in last 24 hours:  Blood pressure 138/67, pulse 76, temperature 97.9 F (36.6 C), temperature source Oral, resp. rate 17, height 5' 3.25" (1.607 m), weight 122 lb 3.2 oz (55.4 kg), SpO2 100 %.    HEENT: No thrush or ulcers. Resp: Distant breath sounds.  No respiratory distress. Cardio: Regular rate and rhythm. GI: Abdomen soft and nontender.  No hepatomegaly. Vascular: No leg edema. Neuro: Alert and oriented. Skin: Erythematous maculopapular rash involving the arms, legs and potentially right side of face and left forehead.  No vesicles.   Lab Results:  Lab Results  Component Value Date   WBC 5.6 06/06/2017   HGB 12.5 04/02/2017   HCT 41.3 06/06/2017   MCV 83.3 06/06/2017   PLT 233 06/06/2017   NEUTROABS 3.7 06/06/2017    Imaging:  No results found.  Medications: I have reviewed the patient's current medications.  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  PDL1 60%  Left lung radiation 11/08/2016 through 11/21/2016  CT chest  12/12/2016-reexpansion of left upper lobe with a decreased left upper lobe mass, unchanged mediastinal adenopathy, progression of a splenic metastasis/pancreatic tail/gastrohepatic ligament metastasis, right lower lobe pneumonia  Cycle 1 Pembrolizumab 12/14/2016  Cycle 2 Pembrolizumab 01/03/2017  Cycle 3 Pembrolizumab 01/24/2017  Cycle 4 Pembrolizumab 02/12/2017  CT 03/05/2018-decrease in left upper lobe mass, mediastinal adenopathy, and splenic mass  Cycle 5 pembrolizumab 03/07/2017  Cycle 6 Pembrolizumab 04/02/2017  Cycle 7 pembrolizumab 04/25/2017  Cycle 8 Pembrolizumab 05/14/2017  Cycle 9 Pembrolizumab 06/06/2017  CT 06/20/2017-mild decrease in left upper lobe mass, mild increase in paramediastinal left upper lobe nodule-radiation change?, decreased splenic metastasis  Cycle 10 pembrolizumab 06/25/2017  Cycle 11 pembrolizumab 07/18/2017  2. Acute respiratory failure secondary to #1  3. Oxygen dependent COPD  4. History of a NSTEMI 2015  5. Right lung pneumonia on chest CT 12/12/2016-treated with Levaquin  6.  Grade 2 rash possibly related to Pembrolizumab.  Treatment held 08/06/2017.     Disposition: Brooke [name] has completed 11 cycles of Pembrolizumab.  She has a progressive skin rash, possibly related to Pembrolizumab, grade 2.  We decided to hold today's treatment.  She will return for a follow-up visit for reevaluation of the rash in 3 weeks.  She understands to contact the office prior to that visit if the rash worsens or she develops any new symptoms.  Patient seen with [name].  25 minutes were spent face-to-face at today's visit with the majority of that time involved in counseling/coordination of care.    [name] ANP/GNP-BC   08/06/2017  12:57 PM This was  a shared visit with [name].  Brooke [name] was interviewed and examined.  She has developed a progressive rash consistent with a pembrolizumab or rash.  Pember Alyssa Mab was placed on hold.   She will contact us for progression of the rash.  She will return for reevaluation in 3 weeks.  Julieanne Manson, MD

## 2017-08-06 NOTE — Telephone Encounter (Signed)
Printed avs and calender of upcoming appointment. Per 4/2 los

## 2017-08-29 ENCOUNTER — Inpatient Hospital Stay: Payer: Medicare Other

## 2017-08-29 ENCOUNTER — Inpatient Hospital Stay (HOSPITAL_BASED_OUTPATIENT_CLINIC_OR_DEPARTMENT_OTHER): Payer: Medicare Other | Admitting: Oncology

## 2017-08-29 ENCOUNTER — Telehealth: Payer: Self-pay | Admitting: Oncology

## 2017-08-29 VITALS — BP 133/85 | HR 80 | Temp 98.7°F | Resp 18 | Ht 63.25 in | Wt 119.3 lb

## 2017-08-29 DIAGNOSIS — R21 Rash and other nonspecific skin eruption: Secondary | ICD-10-CM | POA: Diagnosis not present

## 2017-08-29 DIAGNOSIS — J440 Chronic obstructive pulmonary disease with acute lower respiratory infection: Secondary | ICD-10-CM | POA: Diagnosis not present

## 2017-08-29 DIAGNOSIS — C7889 Secondary malignant neoplasm of other digestive organs: Secondary | ICD-10-CM | POA: Diagnosis not present

## 2017-08-29 DIAGNOSIS — C3412 Malignant neoplasm of upper lobe, left bronchus or lung: Secondary | ICD-10-CM

## 2017-08-29 DIAGNOSIS — I252 Old myocardial infarction: Secondary | ICD-10-CM

## 2017-08-29 DIAGNOSIS — Z9981 Dependence on supplemental oxygen: Secondary | ICD-10-CM | POA: Diagnosis not present

## 2017-08-29 DIAGNOSIS — Z5112 Encounter for antineoplastic immunotherapy: Secondary | ICD-10-CM | POA: Diagnosis not present

## 2017-08-29 LAB — CMP (CANCER CENTER ONLY)
ALT: 16 U/L (ref 0–55)
AST: 16 U/L (ref 5–34)
Albumin: 3.7 g/dL (ref 3.5–5.0)
Alkaline Phosphatase: 81 U/L (ref 40–150)
Anion gap: 8 (ref 3–11)
BILIRUBIN TOTAL: 0.3 mg/dL (ref 0.2–1.2)
BUN: 13 mg/dL (ref 7–26)
CALCIUM: 9.7 mg/dL (ref 8.4–10.4)
CO2: 25 mmol/L (ref 22–29)
CREATININE: 0.62 mg/dL (ref 0.60–1.10)
Chloride: 107 mmol/L (ref 98–109)
Glucose, Bld: 95 mg/dL (ref 70–140)
Potassium: 4.2 mmol/L (ref 3.5–5.1)
Sodium: 140 mmol/L (ref 136–145)
TOTAL PROTEIN: 7.3 g/dL (ref 6.4–8.3)

## 2017-08-29 LAB — CBC WITH DIFFERENTIAL (CANCER CENTER ONLY)
BASOS ABS: 0 10*3/uL (ref 0.0–0.1)
BASOS PCT: 1 %
EOS ABS: 0.2 10*3/uL (ref 0.0–0.5)
Eosinophils Relative: 3 %
HCT: 40.9 % (ref 34.8–46.6)
HEMOGLOBIN: 13.5 g/dL (ref 11.6–15.9)
Lymphocytes Relative: 18 %
Lymphs Abs: 1 10*3/uL (ref 0.9–3.3)
MCH: 28.1 pg (ref 25.1–34.0)
MCHC: 33.1 g/dL (ref 31.5–36.0)
MCV: 84.8 fL (ref 79.5–101.0)
MONOS PCT: 8 %
Monocytes Absolute: 0.4 10*3/uL (ref 0.1–0.9)
NEUTROS PCT: 70 %
Neutro Abs: 4 10*3/uL (ref 1.5–6.5)
Platelet Count: 220 10*3/uL (ref 145–400)
RBC: 4.82 MIL/uL (ref 3.70–5.45)
RDW: 15.3 % — ABNORMAL HIGH (ref 11.2–14.5)
WBC Count: 5.7 10*3/uL (ref 3.9–10.3)

## 2017-08-29 LAB — TSH: TSH: 0.837 u[IU]/mL (ref 0.308–3.960)

## 2017-08-29 MED ORDER — SODIUM CHLORIDE 0.9 % IV SOLN
200.0000 mg | Freq: Once | INTRAVENOUS | Status: AC
  Administered 2017-08-29: 200 mg via INTRAVENOUS
  Filled 2017-08-29: qty 8

## 2017-08-29 MED ORDER — SODIUM CHLORIDE 0.9 % IV SOLN
Freq: Once | INTRAVENOUS | Status: AC
  Administered 2017-08-29: 13:00:00 via INTRAVENOUS

## 2017-08-29 NOTE — Addendum Note (Signed)
Addended by: Betsy Coder B on: 08/29/2017 01:24 PM   Modules accepted: Orders

## 2017-08-29 NOTE — Progress Notes (Signed)
[place] OFFICE PROGRESS NOTE   Diagnosis: Non-small cell lung cancer  INTERVAL HISTORY:   Brooke [name] returns as scheduled.  Pembrolizumab was held 3 weeks ago secondary to a rash.  The rash has improved.  She continues to have pruritus in the areas of the rash.  Over-the-counter hydrocortisone cream caused "vertigo".  No other complaint.  Objective:  Vital signs in last 24 hours:  Blood pressure 133/85, pulse 80, temperature 98.7 F (37.1 C), resp. rate 18, height 5' 3.25" (1.607 m), weight 119 lb 4.8 oz (54.1 kg), SpO2 100 %.    HEENT: No thrush Resp: Distant breath sounds, no respiratory distress Cardio: Regular rate and rhythm GI: No hepatosplenomegaly Vascular: No leg edema  Skin: Few 1-2 mm erythematous lesions over the forearms and lower legs.  Mild confluent erythema at the knees bilaterally.  No rash over the trunk or face.   Lab Results:  Lab Results  Component Value Date   WBC 5.7 08/29/2017   HGB 13.5 08/29/2017   HCT 40.9 08/29/2017   MCV 84.8 08/29/2017   PLT 220 08/29/2017   NEUTROABS 4.0 08/29/2017    CMP     Component Value Date/Time   NA 140 08/29/2017 1106   NA 139 04/02/2017 1026   K 4.2 08/29/2017 1106   K 4.1 04/02/2017 1026   CL 107 08/29/2017 1106   CO2 25 08/29/2017 1106   CO2 25 04/02/2017 1026   GLUCOSE 95 08/29/2017 1106   GLUCOSE 88 04/02/2017 1026   BUN 13 08/29/2017 1106   BUN 19.6 04/02/2017 1026   CREATININE 0.62 08/29/2017 1106   CREATININE 0.6 04/02/2017 1026   CALCIUM 9.7 08/29/2017 1106   CALCIUM 9.3 04/02/2017 1026   PROT 7.3 08/29/2017 1106   PROT 6.9 04/02/2017 1026   ALBUMIN 3.7 08/29/2017 1106   ALBUMIN 3.2 (L) 04/02/2017 1026   AST 16 08/29/2017 1106   AST 17 04/02/2017 1026   ALT 16 08/29/2017 1106   ALT 18 04/02/2017 1026   ALKPHOS 81 08/29/2017 1106   ALKPHOS 86 04/02/2017 1026   BILITOT 0.3 08/29/2017 1106   BILITOT 0.25 04/02/2017 1026   GFRNONAA >60 08/29/2017 1106   GFRAA >60  08/29/2017 1106    No results found for: CEA1  Lab Results  Component Value Date   INR 0.99 11/01/2016     Medications: I have reviewed the patient's current medications.   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  PDL1 60%  Left lung radiation 11/08/2016 through 11/21/2016  CT chest 12/12/2016-reexpansion of left upper lobe with a decreased left upper lobe mass, unchanged mediastinal adenopathy, progression of a splenic metastasis/pancreatic tail/gastrohepatic ligament metastasis, right lower lobe pneumonia  Cycle 1 Pembrolizumab 12/14/2016  Cycle 2 Pembrolizumab 01/03/2017  Cycle 3 Pembrolizumab 01/24/2017  Cycle 4 Pembrolizumab 02/12/2017  CT 03/05/2018-decrease in left upper lobe mass, mediastinal adenopathy, and splenic mass  Cycle 5 pembrolizumab 03/07/2017  Cycle 6 Pembrolizumab 04/02/2017  Cycle 7 pembrolizumab 04/25/2017  Cycle 8 Pembrolizumab 05/14/2017  Cycle 9 Pembrolizumab 06/06/2017  CT 06/20/2017-mild decrease in left upper lobe mass, mild increase in paramediastinal left upper lobe nodule-radiation change?, decreased splenic metastasis  Cycle 10 pembrolizumab 06/25/2017  Cycle 11 pembrolizumab 07/18/2017  Cycle 12 pembrolizumab 08/29/2017  2. Acute respiratory failure secondary to #1  3. Oxygen dependent COPD  4. History of a NSTEMI 2015  5. Right lung pneumonia on chest CT 12/12/2016-treated with Levaquin  6.  Grade 2 rash possibly related to Pembrolizumab.  Treatment held 08/06/2017.  Improved 08/29/2017, treatment  resumed   Disposition: [name] appears well.  The skin rash appears much improved.  The plan is to resume pembrolizumab today.  She will contact us for progression of the rash.  [name] will return for office visit and pembrolizumab in 3 weeks. She will be scheduled for a restaging CT in 2-3 months.  15 minutes were spent with the patient today.  The majority of the time was used for counseling and coordination of care.  Betsy Coder, MD  08/29/2017  12:17 PM

## 2017-08-29 NOTE — Telephone Encounter (Signed)
Scheduled appt per 4/25 los - gave patient aVS and calender per los. Scheduled appt on 5/14 per patient request.

## 2017-08-29 NOTE — Patient Instructions (Signed)
[facility] Discharge Instructions for Patients Receiving Chemotherapy  Today you received the following chemotherapy agents:  Keytruda.  To help prevent nausea and vomiting after your treatment, we encourage you to take your nausea medication as directed.   If you develop nausea and vomiting that is not controlled by your nausea medication, call the clinic.   BELOW ARE SYMPTOMS THAT SHOULD BE REPORTED IMMEDIATELY:  *FEVER GREATER THAN 100.5 F  *CHILLS WITH OR WITHOUT FEVER  NAUSEA AND VOMITING THAT IS NOT CONTROLLED WITH YOUR NAUSEA MEDICATION  *UNUSUAL SHORTNESS OF BREATH  *UNUSUAL BRUISING OR BLEEDING  TENDERNESS IN MOUTH AND THROAT WITH OR WITHOUT PRESENCE OF ULCERS  *URINARY PROBLEMS  *BOWEL PROBLEMS  UNUSUAL RASH Items with * indicate a potential emergency and should be followed up as soon as possible.  Feel free to call the clinic should you have any questions or concerns. The clinic phone number is [phone].  Please show the CHEMO ALERT CARD at check-in to the Emergency Department and triage nurse.    

## 2017-09-15 ENCOUNTER — Other Ambulatory Visit: Payer: Self-pay | Admitting: Oncology

## 2017-09-17 ENCOUNTER — Inpatient Hospital Stay (HOSPITAL_BASED_OUTPATIENT_CLINIC_OR_DEPARTMENT_OTHER): Payer: Medicare Other | Admitting: Oncology

## 2017-09-17 ENCOUNTER — Inpatient Hospital Stay: Payer: Medicare Other

## 2017-09-17 ENCOUNTER — Inpatient Hospital Stay: Payer: Medicare Other | Attending: Oncology

## 2017-09-17 ENCOUNTER — Telehealth: Payer: Self-pay | Admitting: Oncology

## 2017-09-17 VITALS — BP 132/88 | HR 71 | Temp 97.9°F | Resp 17 | Ht 63.25 in | Wt 122.0 lb

## 2017-09-17 DIAGNOSIS — Z9981 Dependence on supplemental oxygen: Secondary | ICD-10-CM

## 2017-09-17 DIAGNOSIS — C3412 Malignant neoplasm of upper lobe, left bronchus or lung: Secondary | ICD-10-CM

## 2017-09-17 DIAGNOSIS — C7889 Secondary malignant neoplasm of other digestive organs: Secondary | ICD-10-CM

## 2017-09-17 DIAGNOSIS — I252 Old myocardial infarction: Secondary | ICD-10-CM | POA: Diagnosis not present

## 2017-09-17 DIAGNOSIS — J449 Chronic obstructive pulmonary disease, unspecified: Secondary | ICD-10-CM

## 2017-09-17 DIAGNOSIS — Z923 Personal history of irradiation: Secondary | ICD-10-CM | POA: Insufficient documentation

## 2017-09-17 DIAGNOSIS — J440 Chronic obstructive pulmonary disease with acute lower respiratory infection: Secondary | ICD-10-CM | POA: Diagnosis not present

## 2017-09-17 DIAGNOSIS — R21 Rash and other nonspecific skin eruption: Secondary | ICD-10-CM

## 2017-09-17 DIAGNOSIS — Z5112 Encounter for antineoplastic immunotherapy: Secondary | ICD-10-CM | POA: Diagnosis not present

## 2017-09-17 LAB — CMP (CANCER CENTER ONLY)
ALT: 17 U/L (ref 0–55)
AST: 16 U/L (ref 5–34)
Albumin: 3.5 g/dL (ref 3.5–5.0)
Alkaline Phosphatase: 89 U/L (ref 40–150)
Anion gap: 6 (ref 3–11)
BILIRUBIN TOTAL: 0.3 mg/dL (ref 0.2–1.2)
BUN: 15 mg/dL (ref 7–26)
CALCIUM: 9.5 mg/dL (ref 8.4–10.4)
CO2: 27 mmol/L (ref 22–29)
CREATININE: 0.62 mg/dL (ref 0.60–1.10)
Chloride: 106 mmol/L (ref 98–109)
GFR, Est AFR Am: >60 mL/min (ref >60)
Glucose, Bld: 90 mg/dL (ref 70–140)
Potassium: 4.2 mmol/L (ref 3.5–5.1)
Sodium: 139 mmol/L (ref 136–145)
TOTAL PROTEIN: 7.1 g/dL (ref 6.4–8.3)

## 2017-09-17 MED ORDER — SODIUM CHLORIDE 0.9 % IV SOLN
200.0000 mg | Freq: Once | INTRAVENOUS | Status: AC
  Administered 2017-09-17: 200 mg via INTRAVENOUS
  Filled 2017-09-17: qty 8

## 2017-09-17 MED ORDER — SODIUM CHLORIDE 0.9 % IV SOLN
Freq: Once | INTRAVENOUS | Status: AC
  Administered 2017-09-17: 12:00:00 via INTRAVENOUS

## 2017-09-17 NOTE — Progress Notes (Signed)
[place] OFFICE PROGRESS NOTE   Diagnosis: Non-small cell lung cancer  INTERVAL HISTORY:   Brooke [name] returns for a scheduled visit.  She completed another treatment with pembrolizumab on 08/29/2017.  She tolerated the treatment well.  No diarrhea.  Good appetite.  She feels well.  The skin rash has generally improved.  She continues to have mild areas of rash at the right shoulder, inferior to the right breast, and at the arms.  Objective:  Vital signs in last 24 hours:  Blood pressure 132/88, pulse 71, temperature 97.9 F (36.6 C), temperature source Oral, resp. rate 17, height 5' 3.25" (1.607 m), weight 122 lb (55.3 kg), SpO2 98 %.    HEENT: No thrush or ulcers Resp: Scattered end expiratory wheeze, no respiratory distress Cardio: Regular rate and rhythm GI: No hepatosplenomegaly Vascular: No leg edema  Skin: Patch of slightly raised erythema at the posterior right shoulder, forearms, and inferior to the right breast.  Portacath/PICC-without erythema  Lab Results:  Lab Results  Component Value Date   WBC 5.7 08/29/2017   HGB 13.5 08/29/2017   HCT 40.9 08/29/2017   MCV 84.8 08/29/2017   PLT 220 08/29/2017   NEUTROABS 4.0 08/29/2017    CMP     Component Value Date/Time   NA 139 09/17/2017 1008   NA 139 04/02/2017 1026   K 4.2 09/17/2017 1008   K 4.1 04/02/2017 1026   CL 106 09/17/2017 1008   CO2 27 09/17/2017 1008   CO2 25 04/02/2017 1026   GLUCOSE 90 09/17/2017 1008   GLUCOSE 88 04/02/2017 1026   BUN 15 09/17/2017 1008   BUN 19.6 04/02/2017 1026   CREATININE 0.62 09/17/2017 1008   CREATININE 0.6 04/02/2017 1026   CALCIUM 9.5 09/17/2017 1008   CALCIUM 9.3 04/02/2017 1026   PROT 7.1 09/17/2017 1008   PROT 6.9 04/02/2017 1026   ALBUMIN 3.5 09/17/2017 1008   ALBUMIN 3.2 (L) 04/02/2017 1026   AST 16 09/17/2017 1008   AST 17 04/02/2017 1026   ALT 17 09/17/2017 1008   ALT 18 04/02/2017 1026   ALKPHOS 89 09/17/2017 1008   ALKPHOS 86  04/02/2017 1026   BILITOT 0.3 09/17/2017 1008   BILITOT 0.25 04/02/2017 1026   GFRNONAA >60 09/17/2017 1008   GFRAA >60 09/17/2017 1008    Medications: I have reviewed the patient's current medications.   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  PDL1 60%  Left lung radiation 11/08/2016 through 11/21/2016  CT chest 12/12/2016-reexpansion of left upper lobe with a decreased left upper lobe mass, unchanged mediastinal adenopathy, progression of a splenic metastasis/pancreatic tail/gastrohepatic ligament metastasis, right lower lobe pneumonia  Cycle 1 Pembrolizumab 12/14/2016  Cycle 2 Pembrolizumab 01/03/2017  Cycle 3 Pembrolizumab 01/24/2017  Cycle 4 Pembrolizumab 02/12/2017  CT 03/05/2018-decrease in left upper lobe mass, mediastinal adenopathy, and splenic mass  Cycle 5 pembrolizumab 03/07/2017  Cycle 6 Pembrolizumab 04/02/2017  Cycle 7 pembrolizumab 04/25/2017  Cycle 8 Pembrolizumab 05/14/2017  Cycle 9 Pembrolizumab 06/06/2017  CT 06/20/2017-mild decrease in left upper lobe mass, mild increase in paramediastinal left upper lobe nodule-radiation change?, decreased splenic metastasis  Cycle 10 pembrolizumab 06/25/2017  Cycle 11  pembrolizumab 07/18/2017  Cycle 12 pembrolizumab 08/29/2017  Cycle 13 pembrolizumab 09/17/2017  2. Acute respiratory failure secondary to #1  3. Oxygen dependent COPD  4. History of a NSTEMI 2015  5. Right lung pneumonia on chest CT 12/12/2016-treated with Levaquin  6.Grade 2 rash possibly related to  Pembrolizumab. Treatment held 08/06/2017.  Improved 08/29/2017, treatment resumed   Disposition: Brooke [name] appears well.  The rash appears improved.  The plan is to continue Pember Alyssa Mab.  She will contact us for progression of the rash or new symptoms.  She will return for an office visit and the next cycle of pembrolizumab in 3 weeks.  She will be scheduled for a restaging CT in late June.  Betsy Coder, MD  09/17/2017  10:54 AM

## 2017-09-17 NOTE — Telephone Encounter (Signed)
Scheduled appt per 5/14 los - gave patient aVS And calender per los.

## 2017-09-17 NOTE — Progress Notes (Signed)
NO CBC needed today per [name].

## 2017-09-17 NOTE — Patient Instructions (Signed)
[place] Discharge Instructions for Patients Receiving Chemotherapy  Today you received the following chemotherapy agents Beryle Flock   To help prevent nausea and vomiting after your treatment, we encourage you to take your nausea medication as directed  If you develop nausea and vomiting that is not controlled by your nausea medication, call the clinic.   BELOW ARE SYMPTOMS THAT SHOULD BE REPORTED IMMEDIATELY:  *FEVER GREATER THAN 100.5 F  *CHILLS WITH OR WITHOUT FEVER  NAUSEA AND VOMITING THAT IS NOT CONTROLLED WITH YOUR NAUSEA MEDICATION  *UNUSUAL SHORTNESS OF BREATH  *UNUSUAL BRUISING OR BLEEDING  TENDERNESS IN MOUTH AND THROAT WITH OR WITHOUT PRESENCE OF ULCERS  *URINARY PROBLEMS  *BOWEL PROBLEMS  UNUSUAL RASH Items with * indicate a potential emergency and should be followed up as soon as possible.  Feel free to call the clinic you have any questions or concerns. The clinic phone number is [phone].

## 2017-10-10 ENCOUNTER — Inpatient Hospital Stay (HOSPITAL_BASED_OUTPATIENT_CLINIC_OR_DEPARTMENT_OTHER): Payer: Medicare Other | Admitting: Nurse Practitioner

## 2017-10-10 ENCOUNTER — Encounter: Payer: Self-pay | Admitting: Nurse Practitioner

## 2017-10-10 ENCOUNTER — Inpatient Hospital Stay: Payer: Medicare Other | Attending: Oncology

## 2017-10-10 ENCOUNTER — Telehealth: Payer: Self-pay | Admitting: Nurse Practitioner

## 2017-10-10 ENCOUNTER — Inpatient Hospital Stay: Payer: Medicare Other

## 2017-10-10 VITALS — BP 124/68 | HR 87 | Temp 98.1°F | Resp 18 | Ht 63.25 in | Wt 121.0 lb

## 2017-10-10 DIAGNOSIS — C3412 Malignant neoplasm of upper lobe, left bronchus or lung: Secondary | ICD-10-CM | POA: Diagnosis not present

## 2017-10-10 DIAGNOSIS — R21 Rash and other nonspecific skin eruption: Secondary | ICD-10-CM | POA: Diagnosis not present

## 2017-10-10 DIAGNOSIS — N289 Disorder of kidney and ureter, unspecified: Secondary | ICD-10-CM | POA: Diagnosis not present

## 2017-10-10 DIAGNOSIS — Z923 Personal history of irradiation: Secondary | ICD-10-CM | POA: Diagnosis not present

## 2017-10-10 DIAGNOSIS — I252 Old myocardial infarction: Secondary | ICD-10-CM | POA: Insufficient documentation

## 2017-10-10 DIAGNOSIS — Z9981 Dependence on supplemental oxygen: Secondary | ICD-10-CM | POA: Diagnosis not present

## 2017-10-10 DIAGNOSIS — C3490 Malignant neoplasm of unspecified part of unspecified bronchus or lung: Secondary | ICD-10-CM

## 2017-10-10 DIAGNOSIS — Z79899 Other long term (current) drug therapy: Secondary | ICD-10-CM | POA: Insufficient documentation

## 2017-10-10 DIAGNOSIS — C7889 Secondary malignant neoplasm of other digestive organs: Secondary | ICD-10-CM

## 2017-10-10 DIAGNOSIS — Z5112 Encounter for antineoplastic immunotherapy: Secondary | ICD-10-CM | POA: Diagnosis present

## 2017-10-10 DIAGNOSIS — J449 Chronic obstructive pulmonary disease, unspecified: Secondary | ICD-10-CM | POA: Diagnosis not present

## 2017-10-10 LAB — CMP (CANCER CENTER ONLY)
ALBUMIN: 3.5 g/dL (ref 3.5–5.0)
ALT: 10 U/L (ref 0–55)
AST: 16 U/L (ref 5–34)
Alkaline Phosphatase: 105 U/L (ref 40–150)
Anion gap: 9 (ref 3–11)
BUN: 14 mg/dL (ref 7–26)
CHLORIDE: 104 mmol/L (ref 98–109)
CO2: 25 mmol/L (ref 22–29)
Calcium: 9.6 mg/dL (ref 8.4–10.4)
Creatinine: 0.62 mg/dL (ref 0.60–1.10)
GFR, Est AFR Am: >60 mL/min (ref >60)
GLUCOSE: 96 mg/dL (ref 70–140)
POTASSIUM: 4.3 mmol/L (ref 3.5–5.1)
Sodium: 138 mmol/L (ref 136–145)
Total Bilirubin: 0.3 mg/dL (ref 0.2–1.2)
Total Protein: 7.4 g/dL (ref 6.4–8.3)

## 2017-10-10 LAB — TSH: TSH: 0.691 u[IU]/mL (ref 0.308–3.960)

## 2017-10-10 MED ORDER — SODIUM CHLORIDE 0.9 % IV SOLN
Freq: Once | INTRAVENOUS | Status: AC
  Administered 2017-10-10: 14:00:00 via INTRAVENOUS

## 2017-10-10 MED ORDER — SODIUM CHLORIDE 0.9 % IV SOLN
200.0000 mg | Freq: Once | INTRAVENOUS | Status: AC
  Administered 2017-10-10: 200 mg via INTRAVENOUS
  Filled 2017-10-10: qty 8

## 2017-10-10 NOTE — Progress Notes (Signed)
Per [name], NP / Dr Benay Spice no need for CBC today for treatment

## 2017-10-10 NOTE — Progress Notes (Signed)
  [name] OFFICE PROGRESS NOTE   Diagnosis: Non-small cell lung cancer  INTERVAL HISTORY:   Brooke [name] returns as scheduled.  She completed another cycle of Pembrolizumab 09/17/2017.  Denies nausea/vomiting.  No mouth sores.  No diarrhea.  She thinks a skin rash is better.  Stable dyspnea on exertion.  Last week on 2 occasions she noted that her heart rate increased coinciding with exertion.  She is not sure if the pulse oximeter is picking up the heart rate accurately.  She has scheduled an appointment with her cardiologist.  Objective:  Vital signs in last 24 hours:  Blood pressure 124/68, pulse 87, temperature 98.1 F (36.7 C), temperature source Oral, resp. rate 18, height 5' 3.25" (1.607 m), weight 121 lb (54.9 kg), SpO2 96 %.    HEENT: No thrush or ulcers. Resp: Lungs clear bilaterally. Cardio: Regular rate and rhythm. GI: Abdomen soft and nontender.  No hepatomegaly. Vascular: No leg edema.  Skin: A few small areas of slightly raised erythema at the right forearm, posterior right shoulder and neck near the hairline.   Lab Results:  Lab Results  Component Value Date   WBC 5.7 08/29/2017   HGB 13.5 08/29/2017   HCT 40.9 08/29/2017   MCV 84.8 08/29/2017   PLT 220 08/29/2017   NEUTROABS 4.0 08/29/2017    Imaging:  No results found.  Medications: I have reviewed the patient's current medications.  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  PDL1 60%  Left lung radiation 11/08/2016 through 11/21/2016  CT chest  12/12/2016-reexpansion of left upper lobe with a decreased left upper lobe mass, unchanged mediastinal adenopathy, progression of a splenic metastasis/pancreatic tail/gastrohepatic ligament metastasis, right lower lobe pneumonia  Cycle 1 Pembrolizumab 12/14/2016  Cycle 2 Pembrolizumab 01/03/2017  Cycle 3 Pembrolizumab 01/24/2017  Cycle 4 Pembrolizumab 02/12/2017  CT 03/05/2018-decrease in left upper lobe mass, mediastinal adenopathy, and splenic mass  Cycle 5 pembrolizumab 03/07/2017  Cycle 6 Pembrolizumab 04/02/2017  Cycle 7 pembrolizumab 04/25/2017  Cycle 8 Pembrolizumab 05/14/2017  Cycle 9 Pembrolizumab 06/06/2017  CT 06/20/2017-mild decrease in left upper lobe mass, mild increase in paramediastinal left upper lobe nodule-radiation change?, decreased splenic metastasis  Cycle 10 pembrolizumab 06/25/2017  Cycle 11 pembrolizumab 07/18/2017  Cycle 12 pembrolizumab 08/29/2017  Cycle 13 pembrolizumab 09/17/2017  Cycle 14 Pembrolizumab 10/10/2017  2. Acute respiratory failure secondary to #1  3. Oxygen dependent COPD  4. History of a NSTEMI 2015  5. Right lung pneumonia on chest CT 12/12/2016-treated with Levaquin  6.Grade 2 rash possibly related to Pembrolizumab. Treatment held 08/06/2017.Improved 08/29/2017, treatment resumed    Disposition: Brooke [name] appears stable.  She has completed 13 cycles of Pembrolizumab.  Plan to proceed with cycle 14 today as scheduled.  She will have a restaging chest CT on 10/24/2017.  She will return for a follow-up visit on 10/29/2017.  She will contact the office in the interim with any problems.    [name] ANP/GNP-BC   10/10/2017  12:06 PM

## 2017-10-10 NOTE — Patient Instructions (Signed)
[facility] Discharge Instructions for Patients Receiving Chemotherapy  Today you received the following chemotherapy agents :  Keytruda.  To help prevent nausea and vomiting after your treatment, we encourage you to take your nausea medication as prescribed.   If you develop nausea and vomiting that is not controlled by your nausea medication, call the clinic.   BELOW ARE SYMPTOMS THAT SHOULD BE REPORTED IMMEDIATELY:  *FEVER GREATER THAN 100.5 F  *CHILLS WITH OR WITHOUT FEVER  NAUSEA AND VOMITING THAT IS NOT CONTROLLED WITH YOUR NAUSEA MEDICATION  *UNUSUAL SHORTNESS OF BREATH  *UNUSUAL BRUISING OR BLEEDING  TENDERNESS IN MOUTH AND THROAT WITH OR WITHOUT PRESENCE OF ULCERS  *URINARY PROBLEMS  *BOWEL PROBLEMS  UNUSUAL RASH Items with * indicate a potential emergency and should be followed up as soon as possible.  Feel free to call the clinic should you have any questions or concerns. The clinic phone number is [phone].  Please show the CHEMO ALERT CARD at check-in to the Emergency Department and triage nurse.  

## 2017-10-10 NOTE — Addendum Note (Signed)
Addended by: Betsy Coder B on: 10/10/2017 01:57 PM   Modules accepted: Orders

## 2017-10-10 NOTE — Telephone Encounter (Signed)
Scheduled appt per 6/6 los - gave patient AVS and calender los.

## 2017-10-22 ENCOUNTER — Ambulatory Visit (INDEPENDENT_AMBULATORY_CARE_PROVIDER_SITE_OTHER): Payer: Medicare Other | Admitting: Physician Assistant

## 2017-10-22 ENCOUNTER — Encounter: Payer: Self-pay | Admitting: Physician Assistant

## 2017-10-22 VITALS — BP 118/74 | HR 82 | Ht 63.25 in | Wt 123.0 lb

## 2017-10-22 DIAGNOSIS — J449 Chronic obstructive pulmonary disease, unspecified: Secondary | ICD-10-CM | POA: Diagnosis not present

## 2017-10-22 DIAGNOSIS — I25111 Atherosclerotic heart disease of native coronary artery with angina pectoris with documented spasm: Secondary | ICD-10-CM | POA: Diagnosis not present

## 2017-10-22 DIAGNOSIS — Z0000 Encounter for general adult medical examination without abnormal findings: Secondary | ICD-10-CM | POA: Diagnosis not present

## 2017-10-22 DIAGNOSIS — R0789 Other chest pain: Secondary | ICD-10-CM

## 2017-10-22 DIAGNOSIS — C3412 Malignant neoplasm of upper lobe, left bronchus or lung: Secondary | ICD-10-CM

## 2017-10-22 DIAGNOSIS — E785 Hyperlipidemia, unspecified: Secondary | ICD-10-CM

## 2017-10-22 MED ORDER — ATORVASTATIN CALCIUM 20 MG PO TABS: 20.0000 mg | ORAL_TABLET | Freq: Every day | ORAL | 3 refills | Status: DC

## 2017-10-22 NOTE — Patient Instructions (Signed)
Medication Instructions: Your physician recommends that you continue on your current medications as directed.    If you need a refill on your cardiac medications before your next appointment, please call your pharmacy.   Labwork: Your physician recommends that you return for lab work in: 1 month (fasting Lipid)     Follow-Up: Your physician wants you to follow-up in 12 months with Dr. Pat Kocher will receive a reminder letter in the mail two months in advance. If you don't receive a letter, please call our office at [phone] to schedule this follow-up appointment.   Special Instructions:    Thank you for choosing [facility] at [facility]!!

## 2017-10-22 NOTE — Progress Notes (Signed)
Cardiology Office Note    Date:  10/22/2017   ID:  Brooke, [name] [birth year], MRN [id number]  PCP:  [name], MD  Cardiologist:  Plan to set up with Dr. Oval Linsey for annual visit. [name] PA-C   Chief Complaint  Patient presents with  . Follow-up    1 year. previous patient of Dr. Mare Ferrari    History of Present Illness:  Brooke [name] is a 72 y.o. female with PMH of COPD on home O2, minimal CAD on cath 06/2013, GERD, hyperlipidemia, LUL mass and former tobacco use. 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 It was possible that she had coronary vasospasm. The event leading up to the cardiac cath was that she lost her car keys while shopping and got into a panic episode.  She has a history of leg cramps if she take Lipitor on a daily basis, therefore she take Lipitor every other day instead.  She is on amlodipine for possible coronary spasm.  I first met the patient in April 2017, she expressed wish to be established at [facility] office due to closer proximity to her house.  I last saw the patient in May 2018, she was doing well at the time.  She was diagnosed with a left upper lobe lung mass, PET scan in October 2017 showed a hypermetabolic left upper lobe mass.  She was referred for bronchoscopy in November 2017, she declined treatment at the time.  CT of the chest in June 2018 showed enlarging left upper lobe mass with new splenic metastasis, new adenopathy at the pancreatic tail, upper abdomen and mid mediastinum.  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  She has completed cycle of  chemotherapy with pembrolizumab on 09/17/2017.  Based on the last oncology note, she has been noticing her heart rate increasing with exertion.  Patient presents today for cardiology office visit.  She occasionally does notice some chest burning sensation, however this typically occur with food or while she is laying down.  This seems to be more related to acid reflux than cardiac in nature.  She denies any chest discomfort with ambulation.  She has no lower extremity edema, orthopnea or PND.  Her amlodipine has been discontinued due to low blood pressure.  She is still taking the Lipitor 20 mg daily.  She will need fasting lipid panel.  Her recent liver function test was normal.  She is stable from cardiology perspective, and can follow-up on a yearly basis.   Past Medical History:  Diagnosis Date  . Chronic respiratory failure (HCC)    a. on home O2.  Brooke [name] COPD (chronic obstructive pulmonary disease) ([place])    a. Home O2.  . Former tobacco use   . GERD (gastroesophageal reflux disease)   . Hyperlipidemia   . NSTEMI (non-ST elevated myocardial infarction) ([place])    a. 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 Coronary vasospasm    Past Surgical History:  Procedure Laterality Date  . LEFT HEART CATHETERIZATION WITH CORONARY ANGIOGRAM N/A 06/08/2013   Procedure: LEFT HEART CATHETERIZATION WITH CORONARY  ANGIOGRAM;  Surgeon: Sanda Klein, MD;  Location: [facility] CATH LAB;  Service: Cardiovascular;  Laterality: N/A;    Current Medications: Outpatient Medications Prior to Visit  Medication Sig Dispense Refill  . acetaminophen (TYLENOL) 325 MG tablet Per bottle as needed    . albuterol (PROVENTIL HFA;VENTOLIN HFA) 108 (90 BASE) MCG/ACT inhaler Inhale 2 puffs into the lungs every 4 (four) hours as needed for wheezing or shortness of breath. **PLAN B**    . ALPRAZolam (XANAX) 0.25 MG tablet 1/2-1 twice daily as needed  1  . budesonide-formoterol  (SYMBICORT) 160-4.5 MCG/ACT inhaler Inhale 2 puffs into the lungs 2 (two) times daily.    Brooke [name] guaiFENesin (MUCINEX) 600 MG 12 hr tablet Take 600 mg by mouth 2 (two) times daily as needed for cough or to loosen phlegm.     Brooke [name] ibuprofen (ADVIL,MOTRIN) 600 MG tablet Take 600 mg by mouth every 6 (six) hours as needed.    Brooke [name] ipratropium-albuterol (DUONEB) 0.5-2.5 (3) MG/3ML SOLN Take 3 mLs by nebulization every 4 (four) hours as needed (wheezing/shortness of breath). **PLAN C**    . mometasone-formoterol (DULERA) 200-5 MCG/ACT AERO Inhale into the lungs.    . nitroGLYCERIN (NITROSTAT) 0.4 MG SL tablet ONE TABLET UNDER TONGUE AS NEEDED FOR CHEST PAIN EVERY 5 MINUTES FOR 3 DOSES 25 tablet 3  . OXYGEN 2lpm 24/7    . Simethicone (GAS RELIEF) 180 MG CAPS Use as needed    . atorvastatin (LIPITOR) 20 MG tablet Take 1 tablet (20 mg total) by mouth daily. 90 tablet 3   No facility-administered medications prior to visit.      Allergies:   Codeine; Imdur [isosorbide dinitrate]; Prednisone; and Pulmicort [budesonide]   Social History   Socioeconomic History  . Marital status: Divorced    Spouse name: Not on file  . Number of children: Not on file  . Years of education: Not on file  . Highest education level: Not on file  Occupational History  . Not on file  Social Needs  . Financial resource strain: Not on file  . Food insecurity:    Worry: Not on file    Inability: Not on file  . Transportation needs:    Medical: Not on file    Non-medical: Not on file  Tobacco Use  . Smoking status: Former Smoker    Packs/day: 1.00    Years: 30.00    Pack years: 30.00    Types: Cigarettes    Last attempt to quit: 05/08/2007    Years since quitting: 10.4  . Smokeless tobacco: Never Used  Substance and Sexual Activity  . Alcohol use: Yes    Comment: rarely  . Drug use: No  . Sexual activity: Not on file  Lifestyle  . Physical activity:    Days per week: Not on file    Minutes per session: Not on file  .  Stress: Not on file  Relationships  . Social connections:    Talks on phone: Not on file    Gets together: Not on file    Attends religious service: Not on file    Active member of club or organization: Not on file    Attends meetings of clubs or organizations: Not on file    Relationship status: Not on file  Other Topics Concern  . Not on file  Social History Narrative  . Not on file     Family History:  The patient's family history includes Lung cancer in her father.   ROS:  Please see the history of present illness.    ROS All other systems reviewed and are negative.   PHYSICAL EXAM:   VS:  BP 118/74 (BP Location: Left Arm, Patient Position: Sitting, Cuff Size: Normal)   Pulse 82   Ht 5' 3.25" (1.607 m)   Wt 123 lb (55.8 kg)   BMI 21.62 kg/m    GEN: Well nourished, well developed, in no acute distress  HEENT: normal  Neck: no JVD, carotid bruits, or masses Cardiac: RRR; no murmurs, rubs, or gallops,no edema  Respiratory:  clear to auscultation bilaterally, normal work of breathing GI: soft, nontender, nondistended, + BS MS: no deformity or atrophy  Skin: warm and dry, no rash Neuro:  Alert and Oriented x 3, Strength and sensation are intact Psych: euthymic mood, full affect  Wt Readings from Last 3 Encounters:  10/22/17 123 lb (55.8 kg)  10/10/17 121 lb (54.9 kg)  09/17/17 122 lb (55.3 kg)      Studies/Labs Reviewed:   EKG:  EKG is ordered today.  The ekg ordered today demonstrates normal sinus rhythm without significant ST-T wave changes.  Recent Labs: 10/22/2016: B Natriuretic Peptide 40.3 08/29/2017: Hemoglobin 13.5; Platelet Count 220 10/10/2017: ALT 10; BUN 14; Creatinine 0.62; Potassium 4.3; Sodium 138; TSH 0.691   Lipid Panel    Component Value Date/Time   CHOL 166 08/18/2015 1041   TRIG 58 08/18/2015 1041   HDL 76 08/18/2015 1041   CHOLHDL 2.2 08/18/2015 1041   VLDL 12 08/18/2015 1041   LDLCALC 78 08/18/2015 1041    Additional studies/  records that were reviewed today include:   Echo 06/06/2013 LV EF: 50% -  55%  Left ventricle: The cavity size was normal. Systolic function was normal. The estimated ejection fraction was in the range of 50% to 55%. Regional wall motion abnormalities cannot be excluded. Doppler parameters are consistent with abnormal left ventricular relaxation (grade 1 diastolic dysfunction).       ASSESSMENT:    1. Burning chest pain   2. Annual physical exam   3. Coronary artery disease involving native coronary artery of native heart with angina pectoris with documented spasm ([place])   4. Chronic obstructive pulmonary disease, unspecified COPD type ([place])   5. Hyperlipidemia, unspecified hyperlipidemia type   6. Malignant neoplasm of upper lobe of left lung ([place])      PLAN:  In order of problems listed above:  1. Chest burning sensation: Tend to occur at rest and after eating certain food.  Accompanied by abdominal bloating.  Appears to be GI etiology.  Would not recommend further work-up based on atypical symptoms.  2. CAD with history of coronary spasm: She is no longer taking amlodipine 2.5 mg daily.  However he his recent symptom is inconsistent with coronary spasm either.  Therefore I did not restart on the amlodipine.  Continue statin  3. COPD: She has home oxygen, however she does not use it consistently.  She carries around with her pulse oximeter, she only wear oxygen if her O2 saturation dropped below a certain threshold  4. Hyperlipidemia: Continue on low-dose Lipitor.  Obtain fasting lipid panel  5. Left lung cancer: Underwent chemo and radiation therapy.  Occasionally she does notice her heart rate increased with exertion, this is expected.  She does not seems to complain of extreme tachycardia, today's EKG is reassuring.    Medication Adjustments/Labs and Tests Ordered: Current medicines are reviewed at length with the patient today.  Concerns regarding medicines are  outlined above.  Medication changes, Labs and Tests ordered today are listed in the Patient Instructions below. Patient Instructions  Medication Instructions: Your physician recommends that you continue on your current medications as directed.    If you need a refill on your cardiac medications before your next appointment, please call your pharmacy.   Labwork: Your physician recommends that you return for lab work in: 1 month (fasting Lipid)     Follow-Up: Your physician wants you to follow-up in 12 months with Dr. Pat Kocher will receive a reminder letter in the mail two months in advance. If you don't receive a letter, please call our office at [phone] to schedule this follow-up appointment.   Special Instructions:    Thank you for choosing [facility] at [facility], [name], [place]  10/22/2017 1:57 PM    [facility] [address], [place], [place]  [address] Phone: [phone]; Fax: [phone]

## 2017-10-24 ENCOUNTER — Encounter (HOSPITAL_COMMUNITY): Payer: Self-pay

## 2017-10-24 ENCOUNTER — Ambulatory Visit (HOSPITAL_COMMUNITY)
Admission: RE | Admit: 2017-10-24 | Discharge: 2017-10-24 | Disposition: A | Discharge status: Home / Self Care | Payer: Medicare Other | Source: Ambulatory Visit | Attending: Nurse Practitioner | Admitting: Nurse Practitioner

## 2017-10-24 DIAGNOSIS — R590 Localized enlarged lymph nodes: Secondary | ICD-10-CM | POA: Diagnosis not present

## 2017-10-24 DIAGNOSIS — C7889 Secondary malignant neoplasm of other digestive organs: Secondary | ICD-10-CM | POA: Insufficient documentation

## 2017-10-24 DIAGNOSIS — C3490 Malignant neoplasm of unspecified part of unspecified bronchus or lung: Secondary | ICD-10-CM | POA: Diagnosis present

## 2017-10-24 DIAGNOSIS — R918 Other nonspecific abnormal finding of lung field: Secondary | ICD-10-CM | POA: Diagnosis not present

## 2017-10-24 MED ORDER — IOHEXOL 300 MG/ML  SOLN
75.0000 mL | Freq: Once | INTRAMUSCULAR | Status: AC | PRN
  Administered 2017-10-24: 75 mL via INTRAVENOUS

## 2017-10-27 ENCOUNTER — Other Ambulatory Visit: Payer: Self-pay | Admitting: Oncology

## 2017-10-29 ENCOUNTER — Inpatient Hospital Stay (HOSPITAL_BASED_OUTPATIENT_CLINIC_OR_DEPARTMENT_OTHER): Payer: Medicare Other | Admitting: Oncology

## 2017-10-29 ENCOUNTER — Telehealth: Payer: Self-pay | Admitting: Oncology

## 2017-10-29 ENCOUNTER — Inpatient Hospital Stay: Payer: Medicare Other

## 2017-10-29 ENCOUNTER — Encounter: Payer: Self-pay | Admitting: Oncology

## 2017-10-29 VITALS — BP 111/57 | HR 81 | Temp 98.0°F | Resp 17 | Ht 63.25 in | Wt 123.1 lb

## 2017-10-29 DIAGNOSIS — Z9981 Dependence on supplemental oxygen: Secondary | ICD-10-CM

## 2017-10-29 DIAGNOSIS — Z79899 Other long term (current) drug therapy: Secondary | ICD-10-CM | POA: Diagnosis not present

## 2017-10-29 DIAGNOSIS — Z923 Personal history of irradiation: Secondary | ICD-10-CM | POA: Diagnosis not present

## 2017-10-29 DIAGNOSIS — R21 Rash and other nonspecific skin eruption: Secondary | ICD-10-CM

## 2017-10-29 DIAGNOSIS — C3412 Malignant neoplasm of upper lobe, left bronchus or lung: Secondary | ICD-10-CM

## 2017-10-29 DIAGNOSIS — J449 Chronic obstructive pulmonary disease, unspecified: Secondary | ICD-10-CM

## 2017-10-29 DIAGNOSIS — C7889 Secondary malignant neoplasm of other digestive organs: Secondary | ICD-10-CM

## 2017-10-29 DIAGNOSIS — Z5112 Encounter for antineoplastic immunotherapy: Secondary | ICD-10-CM | POA: Diagnosis not present

## 2017-10-29 DIAGNOSIS — N289 Disorder of kidney and ureter, unspecified: Secondary | ICD-10-CM | POA: Diagnosis not present

## 2017-10-29 DIAGNOSIS — C3490 Malignant neoplasm of unspecified part of unspecified bronchus or lung: Secondary | ICD-10-CM

## 2017-10-29 LAB — CBC WITH DIFFERENTIAL (CANCER CENTER ONLY)
Basophils Absolute: 0 10*3/uL (ref 0.0–0.1)
Basophils Relative: 0 %
EOS PCT: 2 %
Eosinophils Absolute: 0.1 10*3/uL (ref 0.0–0.5)
HCT: 41.3 % (ref 34.8–46.6)
HEMOGLOBIN: 13.3 g/dL (ref 11.6–15.9)
LYMPHS PCT: 16 %
Lymphs Abs: 1 10*3/uL (ref 0.9–3.3)
MCH: 27.9 pg (ref 25.1–34.0)
MCHC: 32.2 g/dL (ref 31.5–36.0)
MCV: 86.6 fL (ref 79.5–101.0)
Monocytes Absolute: 0.6 10*3/uL (ref 0.1–0.9)
Monocytes Relative: 10 %
NEUTROS PCT: 72 %
Neutro Abs: 4.7 10*3/uL (ref 1.5–6.5)
PLATELETS: 245 10*3/uL (ref 145–400)
RBC: 4.77 MIL/uL (ref 3.70–5.45)
RDW: 14.2 % (ref 11.2–14.5)
WBC: 6.5 10*3/uL (ref 3.9–10.3)

## 2017-10-29 LAB — TSH: TSH: 1.335 u[IU]/mL (ref 0.308–3.960)

## 2017-10-29 LAB — CMP (CANCER CENTER ONLY)
ALK PHOS: 101 U/L (ref 38–126)
ALT: 13 U/L (ref 0–44)
AST: 15 U/L (ref 15–41)
Albumin: 3.5 g/dL (ref 3.5–5.0)
Anion gap: 9 (ref 5–15)
BUN: 16 mg/dL (ref 8–23)
CO2: 26 mmol/L (ref 22–32)
CREATININE: 0.64 mg/dL (ref 0.44–1.00)
Calcium: 9.6 mg/dL (ref 8.9–10.3)
Chloride: 104 mmol/L (ref 98–111)
Glucose, Bld: 84 mg/dL (ref 70–99)
Potassium: 4.3 mmol/L (ref 3.5–5.1)
Sodium: 139 mmol/L (ref 135–145)
Total Bilirubin: 0.3 mg/dL (ref 0.3–1.2)
Total Protein: 7.5 g/dL (ref 6.5–8.1)

## 2017-10-29 MED ORDER — SODIUM CHLORIDE 0.9 % IV SOLN
200.0000 mg | Freq: Once | INTRAVENOUS | Status: AC
  Administered 2017-10-29: 200 mg via INTRAVENOUS
  Filled 2017-10-29: qty 8

## 2017-10-29 MED ORDER — SODIUM CHLORIDE 0.9 % IV SOLN
Freq: Once | INTRAVENOUS | Status: AC
  Administered 2017-10-29: 13:00:00 via INTRAVENOUS

## 2017-10-29 NOTE — Patient Instructions (Signed)
[facility] Discharge Instructions for Patients Receiving Chemotherapy  Today you received the following chemotherapy agents:  Keytruda.  To help prevent nausea and vomiting after your treatment, we encourage you to take your nausea medication as directed.   If you develop nausea and vomiting that is not controlled by your nausea medication, call the clinic.   BELOW ARE SYMPTOMS THAT SHOULD BE REPORTED IMMEDIATELY:  *FEVER GREATER THAN 100.5 F  *CHILLS WITH OR WITHOUT FEVER  NAUSEA AND VOMITING THAT IS NOT CONTROLLED WITH YOUR NAUSEA MEDICATION  *UNUSUAL SHORTNESS OF BREATH  *UNUSUAL BRUISING OR BLEEDING  TENDERNESS IN MOUTH AND THROAT WITH OR WITHOUT PRESENCE OF ULCERS  *URINARY PROBLEMS  *BOWEL PROBLEMS  UNUSUAL RASH Items with * indicate a potential emergency and should be followed up as soon as possible.  Feel free to call the clinic should you have any questions or concerns. The clinic phone number is [phone].  Please show the CHEMO ALERT CARD at check-in to the Emergency Department and triage nurse.    

## 2017-10-29 NOTE — Progress Notes (Signed)
[place] OFFICE PROGRESS NOTE   Diagnosis: Non-small cell lung cancer  INTERVAL HISTORY:   Brooke [name] returns as scheduled.  She feels well.  No recurrent skin rash.  No diarrhea.  Good appetite.  She has a postnasal drip and occasional cough.  No fever.  Stable dyspnea.  She reports hoarseness since she was diagnosed with lung cancer.  Objective:  Vital signs in last 24 hours:  Blood pressure (!) 111/57, pulse 81, temperature 98 F (36.7 C), temperature source Oral, resp. rate 17, height 5' 3.25" (1.607 m), weight 123 lb 1.6 oz (55.8 kg), SpO2 99 %.    HEENT: No thrush Lymphatics: No cervical, supraclavicular, or axillary nodes Resp: Lungs clear bilaterally, no respiratory distress Cardio: Regular rate and rhythm GI: No hepatosplenomegaly Vascular: No leg edema  Skin: No rash   Lab Results:  Lab Results  Component Value Date   WBC 6.5 10/29/2017   HGB 13.3 10/29/2017   HCT 41.3 10/29/2017   MCV 86.6 10/29/2017   PLT 245 10/29/2017   NEUTROABS 4.7 10/29/2017    CMP  Lab Results  Component Value Date   NA 138 10/10/2017   K 4.3 10/10/2017   CL 104 10/10/2017   CO2 25 10/10/2017   GLUCOSE 96 10/10/2017   BUN 14 10/10/2017   CREATININE 0.62 10/10/2017   CALCIUM 9.6 10/10/2017   PROT 7.4 10/10/2017   ALBUMIN 3.5 10/10/2017   AST 16 10/10/2017   ALT 10 10/10/2017   ALKPHOS 105 10/10/2017   BILITOT 0.3 10/10/2017   GFRNONAA >60 10/10/2017   GFRAA >60 10/10/2017    Images: CT chest 10/24/2017- minimal enlargement of the small mediastinal lymph nodes, aggressive left upper lobe consolidation obscuring the previously identified left upper lobe mass, decreased size of splenic metastasis  Medications: I have reviewed the patient's current medications.   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  PDL1 60%  Left lung radiation 11/08/2016 through 11/21/2016  CT chest 12/12/2016-reexpansion of left upper lobe with a decreased left upper lobe mass, unchanged mediastinal adenopathy, progression of a splenic metastasis/pancreatic tail/gastrohepatic ligament metastasis, right lower lobe pneumonia  Cycle 1 Pembrolizumab 12/14/2016  Cycle 2 Pembrolizumab 01/03/2017  Cycle 3 Pembrolizumab 01/24/2017  Cycle 4 Pembrolizumab 02/12/2017  CT 03/05/2018-decrease in left upper lobe mass, mediastinal adenopathy, and splenic mass  Cycle 5 pembrolizumab 03/07/2017  Cycle 6 Pembrolizumab 04/02/2017  Cycle 7 pembrolizumab 04/25/2017  Cycle 8 Pembrolizumab 05/14/2017  Cycle 9 Pembrolizumab 06/06/2017  CT 06/20/2017-mild decrease in left upper lobe mass, mild increase in paramediastinal left upper lobe nodule-radiation change?, decreased splenic metastasis  Cycle 10 pembrolizumab 06/25/2017  Cycle 11 pembrolizumab 07/18/2017  Cycle 12 pembrolizumab 08/29/2017  Cycle 13 pembrolizumab 09/17/2017  Cycle 14 Pembrolizumab 10/10/2017  CT chest 10/24/2017- slight enlargement of small mediastinal lymph nodes, increased left upper lobe consolidation, decreased size of spleen lesion  Cycle 15 pembrolizumab 10/29/2017  2. Acute respiratory failure secondary to #1  3. Oxygen dependent COPD  4. History of a NSTEMI 2015  5. Right lung pneumonia on chest CT 12/12/2016-treated with Levaquin  6.Grade 2 rash possibly related to Pembrolizumab. Treatment held 08/06/2017.Improved 08/29/2017, treatment resumed, rash  has resolved   Disposition: Brooke [name] appears stable.  She is tolerating the pembrolizumab well.  There is no  clinical evidence of disease progression.  I reviewed the restaging chest CT with her.  There are increase infiltrative/consolidation changes in the left upper lung, most likely secondary to radiation.  The measurable spleen lesion is smaller.  There are tiny mediastinal lymph nodes that measure a few millimeters larger.  The overall pattern is consistent with stable or improved disease.  I recommend continuing pembrolizumab.  She will complete another cycle today. Brooke [name] will return for office visit and pembrolizumab in 3 weeks.  Brooke Coder, MD  10/29/2017  12:22 PM

## 2017-10-29 NOTE — Telephone Encounter (Signed)
Scheduled appt per 6/25 los - gave patient aVS and calender per los.  

## 2017-11-21 ENCOUNTER — Inpatient Hospital Stay (HOSPITAL_BASED_OUTPATIENT_CLINIC_OR_DEPARTMENT_OTHER): Payer: Medicare Other | Admitting: Nurse Practitioner

## 2017-11-21 ENCOUNTER — Telehealth: Payer: Self-pay

## 2017-11-21 ENCOUNTER — Encounter: Payer: Self-pay | Admitting: Nurse Practitioner

## 2017-11-21 ENCOUNTER — Inpatient Hospital Stay: Payer: Medicare Other | Attending: Oncology

## 2017-11-21 VITALS — BP 121/56 | HR 78 | Temp 98.0°F | Resp 18 | Ht 63.25 in | Wt 123.5 lb

## 2017-11-21 DIAGNOSIS — C3412 Malignant neoplasm of upper lobe, left bronchus or lung: Secondary | ICD-10-CM | POA: Insufficient documentation

## 2017-11-21 DIAGNOSIS — Z923 Personal history of irradiation: Secondary | ICD-10-CM

## 2017-11-21 DIAGNOSIS — C7889 Secondary malignant neoplasm of other digestive organs: Secondary | ICD-10-CM | POA: Insufficient documentation

## 2017-11-21 DIAGNOSIS — J449 Chronic obstructive pulmonary disease, unspecified: Secondary | ICD-10-CM

## 2017-11-21 DIAGNOSIS — Z5112 Encounter for antineoplastic immunotherapy: Secondary | ICD-10-CM | POA: Insufficient documentation

## 2017-11-21 DIAGNOSIS — Z9981 Dependence on supplemental oxygen: Secondary | ICD-10-CM

## 2017-11-21 MED ORDER — SODIUM CHLORIDE 0.9 % IV SOLN
Freq: Once | INTRAVENOUS | Status: AC
  Administered 2017-11-21: 12:00:00 via INTRAVENOUS

## 2017-11-21 MED ORDER — SODIUM CHLORIDE 0.9 % IV SOLN
200.0000 mg | Freq: Once | INTRAVENOUS | Status: AC
  Administered 2017-11-21: 200 mg via INTRAVENOUS
  Filled 2017-11-21: qty 8

## 2017-11-21 NOTE — Progress Notes (Signed)
Per [name], NP / Dr Benay Spice no labs needed today for [facility]

## 2017-11-21 NOTE — Addendum Note (Signed)
Addended by: Betsy Coder B on: 11/21/2017 12:12 PM   Modules accepted: Orders

## 2017-11-21 NOTE — Patient Instructions (Signed)
[facility] Discharge Instructions for Patients Receiving Chemotherapy  Today you received the following chemotherapy agents:  Keytruda.  To help prevent nausea and vomiting after your treatment, we encourage you to take your nausea medication as directed.   If you develop nausea and vomiting that is not controlled by your nausea medication, call the clinic.   BELOW ARE SYMPTOMS THAT SHOULD BE REPORTED IMMEDIATELY:  *FEVER GREATER THAN 100.5 F  *CHILLS WITH OR WITHOUT FEVER  NAUSEA AND VOMITING THAT IS NOT CONTROLLED WITH YOUR NAUSEA MEDICATION  *UNUSUAL SHORTNESS OF BREATH  *UNUSUAL BRUISING OR BLEEDING  TENDERNESS IN MOUTH AND THROAT WITH OR WITHOUT PRESENCE OF ULCERS  *URINARY PROBLEMS  *BOWEL PROBLEMS  UNUSUAL RASH Items with * indicate a potential emergency and should be followed up as soon as possible.  Feel free to call the clinic should you have any questions or concerns. The clinic phone number is [phone].  Please show the CHEMO ALERT CARD at check-in to the Emergency Department and triage nurse.    

## 2017-11-21 NOTE — Progress Notes (Signed)
  [place] OFFICE PROGRESS NOTE   Diagnosis:  Non-small cell lung cancer  INTERVAL HISTORY:   Ms. Brafford returns as scheduled.  She completed another cycle of Pembrolizumab 10/29/2017.  She denies nausea/vomiting.  No mouth sores.  No diarrhea.  She had a recent question insect bite at the left shoulder which is resolving.  Otherwise no rash.  Stable dyspnea on exertion.  She has a good appetite.  Objective:  Vital signs in last 24 hours:  Blood pressure (!) 121/56, pulse 78, temperature 98 F (36.7 C), temperature source Oral, resp. rate 18, height 5' 3.25" (1.607 m), weight 123 lb 8 oz (56 kg), SpO2 96 %.    HEENT: No thrush or ulcers. Resp: Distant breath sounds.  No respiratory distress. Cardio: Regular rate and rhythm. GI: Abdomen soft and nontender.  No hepatomegaly. Vascular: No leg edema.  Calves soft and nontender.  Skin: No rash.   Lab Results:  Lab Results  Component Value Date   WBC 6.5 10/29/2017   HGB 13.3 10/29/2017   HCT 41.3 10/29/2017   MCV 86.6 10/29/2017   PLT 245 10/29/2017   NEUTROABS 4.7 10/29/2017    Imaging:  No results found.  Medications: I have reviewed the patient's current medications.  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  PDL1 60%  Left lung radiation 11/08/2016 through 11/21/2016  CT chest 12/12/2016-reexpansion of left upper lobe with a decreased left upper lobe mass, unchanged mediastinal adenopathy, progression of a splenic metastasis/pancreatic tail/gastrohepatic ligament metastasis,  right lower lobe pneumonia  Cycle 1 Pembrolizumab 12/14/2016  Cycle 2 Pembrolizumab 01/03/2017  Cycle 3 Pembrolizumab 01/24/2017  Cycle 4 Pembrolizumab 02/12/2017  CT 03/05/2018-decrease in left upper lobe mass, mediastinal adenopathy, and splenic mass  Cycle 5 pembrolizumab 03/07/2017  Cycle 6 Pembrolizumab 04/02/2017  Cycle 7 pembrolizumab 04/25/2017  Cycle 8 Pembrolizumab 05/14/2017  Cycle 9 Pembrolizumab 06/06/2017  CT 06/20/2017-mild decrease in left upper lobe mass, mild increase in paramediastinal left upper lobe nodule-radiation change?, decreased splenic metastasis  Cycle 10 pembrolizumab 06/25/2017  Cycle 11 pembrolizumab 07/18/2017  Cycle 12 pembrolizumab 08/29/2017  Cycle 13 pembrolizumab 09/17/2017  Cycle 14 Pembrolizumab 10/10/2017  CT chest 10/24/2017- slight enlargement of small mediastinal lymph nodes, increased left upper lobe consolidation, decreased size of spleen lesion  Cycle 15 pembrolizumab 10/29/2017  Cycle 16 Pembrolizumab 11/21/2017  2. Acute respiratory failure secondary to #1  3. Oxygen dependent COPD  4. History of a NSTEMI 2015  5. Right lung pneumonia on chest CT 12/12/2016-treated with Levaquin  6.Grade 2 rash possibly related to Pembrolizumab. Treatment held 08/06/2017.Improved 08/29/2017, treatment resumed, rash has resolved    Disposition: Ms. Rapaport appears stable.  She has completed 15 cycles of Pembrolizumab.  Plan to proceed with cycle 16 today as scheduled.  She will return for lab, follow-up and the next cycle of Pembrolizumab in 3 weeks.  She will contact the office in the interim with any problems.    [name] ANP/GNP-BC   11/21/2017  10:51 AM

## 2017-11-21 NOTE — Telephone Encounter (Signed)
Printed avs and calender of upcoming appointment. Per 7/18 los. Patient is aware of the gap and is okay with it. Also no avalibility on the 8/6 and patient was scheduled for 8/7 instead. Her transportation said that this date was okay to bring the patient on for the appointment.

## 2017-12-07 ENCOUNTER — Other Ambulatory Visit: Payer: Self-pay | Admitting: Oncology

## 2017-12-09 ENCOUNTER — Encounter: Payer: Self-pay | Admitting: Internal Medicine

## 2017-12-09 ENCOUNTER — Ambulatory Visit (INDEPENDENT_AMBULATORY_CARE_PROVIDER_SITE_OTHER): Payer: Medicare Other | Admitting: Internal Medicine

## 2017-12-09 VITALS — BP 126/70 | HR 82 | Ht 63.25 in | Wt 125.0 lb

## 2017-12-09 DIAGNOSIS — J9611 Chronic respiratory failure with hypoxia: Secondary | ICD-10-CM | POA: Diagnosis not present

## 2017-12-09 DIAGNOSIS — R918 Other nonspecific abnormal finding of lung field: Secondary | ICD-10-CM

## 2017-12-09 DIAGNOSIS — J449 Chronic obstructive pulmonary disease, unspecified: Secondary | ICD-10-CM

## 2017-12-09 MED ORDER — TIOTROPIUM BROMIDE MONOHYDRATE 2.5 MCG/ACT IN AERS: 1.0000 | INHALATION_SPRAY | Freq: Every day | RESPIRATORY_TRACT | 0 refills | Status: DC

## 2017-12-09 MED ORDER — TIOTROPIUM BROMIDE MONOHYDRATE 2.5 MCG/ACT IN AERS: INHALATION_SPRAY | RESPIRATORY_TRACT | 11 refills | Status: DC

## 2017-12-09 NOTE — Progress Notes (Addendum)
Subjective:    Patient ID: Brooke [name], female   DOB: 16-Jun-1945,    MRN: [id number]     Brief patient profile:  42 yowf quit smoking 2009 on 02 noct 2012 maint on symbicort 160 since aorund  2014 referred to pulmonary clinic 02/15/2016 by Dr   Kathryne Eriksson for RUL Lung mass dx with Stage IV nsc 11/01/16 in setting of GOLD IV copd      History of Present Illness  02/15/2016 1st [facility] office visit/ Orey Moure  GOLD IV copd/ symb 160 2bid maint  Chief Complaint  Patient presents with  . Pulmonary Consult    referred by Dr. Kathryne Eriksson for COPD.  MMRC2 = can't walk a nl pace on a flat grade s sob but does fine slow and flat eg [employer]/ [employer] on 2lpm  New onset bloody mucus plugs November 11 2015 assoc with wt loss  02 2lpm hs and with walking / rare saba needed rec For cough > mucinex up to 1200 mg every 12 hours as needed Rec:  Fob but refused   11/01/16   CT directed bx >>>  Pos [facility] LUL    Diagnosis:   Stage IV NSCLC, Squamous cell carcinoma of the left upper lobe.     Indication for treatment:  palliative       Radiation treatment dates:   11/08/2016 to 11/21/2016  Site/dose:   The Left lung was treated to 30 Gy in 10 fractions at 3 Gy per fraction.   Beams/energy:   3D // 10X, 6X    12/06/2016  f/u ov/Ahkeem Goede re: post hosp f/u -  Now on dulera 200 bid and duoneb Chief Complaint  Patient presents with  . HFU    Breathing is doing well. She is using Duoneb 2 x daily and uses albuterol inhaler 1-2 x per wk on average.   presently in SNF ever since last admit 6/18-29/18 for  Active Problems:   COPD  GOLD IV    Mass of upper lobe of left lung   Chronic respiratory failure with hypoxia (HCC)   COPD exacerbation (HCC)   Hypokalemia   Microcytic anemia   SOB (shortness of breath)   Protein-calorie malnutrition, severe Doe = 2lpm x 137ft with rolling walking  Sleeping ok at < 30 degrees Doe = 2lpm x 129ft with rolling walker/2lpm Sleeping ok at < 30 degrees   rec 02 can be adjusted down and off for saturations above 90%     12/12/16 rx Levaquin for ? RLL pna by oncology based on CT findings  though no clear clinical infection    01/08/2017  f/u ov/Kailene Steinhart re:   GOLD IV copd/  [facility] lung ca s/p 10 RT and on [place]  Chief Complaint  Patient presents with  . Follow-up    Pt states going to be discharged from home on 01/09/17. She states her breathing is doing well and no co's.   doe improving at SNF > doing some outdoors walking pushing w/c on 02(puts it in the w/c)  including some inclines  2lpm exertion and hs but not at rest / still using duoneb bid (not prn) and dulera 200 2bid with poor hfa (see copd ap)  rec Symbicort 160 = Dulera 200 Goal is to keep saturation over 90% at rest and with exertion/ continue 02 2lpm at bedtime Plan A = Automatic =  dulera 200 Take 2 puffs first thing in am and then another 2 puffs about 12 hours  later.  Work on inhaler technique:   Plan B = Backup Only use your albuterol as a rescue medication  Plan C = Crisis - only use your albuterol (duoneb) nebulizer if you first try Plan B and it fails to help > ok to use the nebulizer up to every 4 hours but if start needing it regularly call for immediate appointment     06/10/2017  f/u ov/Mertice Uffelman re:  GOLD IV copd Chief Complaint  Patient presents with  . Follow-up    Breathing has improved some since her last visit. She is using her neb 2 x daily and she has not used her albuterol inhaler due to cost.  Dyspnea:  MMRC2 = can't walk a nl pace on a flat grade s sob but does fine slow and flat eg shopping leaning on cart on 2lpm  Cough: minimium in am  Sleep: on 2lpm some nasal congestion / humidified / never needs saba at hs but when does use saba always neb now due to cost noted  Having some HB better with prn pepcid  rec Work on inhaler technique:  For acid indigestion > pepcid 20 mg up to 12 hours as needed GERD diet    12/09/2017  f/u ov/Apryle Stowell re: GOLD IV copd/  No 02  at rest/ 2lpm sleep and with activity  Chief Complaint  Patient presents with  . Follow-up    Breathing is overall doing well. She has occ cough with white sputum- relates to PND.  She uses her albuterol inhaler 2 x daily on average. She states she uses neb before she knows she is going somewhere.   Dyspnea:  MMRC2 = can't walk a nl pace on a flat grade s sob but does fine slow and flat eg still pushing cart  Cough: min pnd    SABA use: as above      Contributing to gerd risk/ doe/reviewed the need and the process to achieve and maintain neg calorie balance > defer f/u primary care including intermittently monitoring thyroid status     No obvious day to day or daytime variability or assoc excess/ purulent sputum or mucus plugs or hemoptysis or cp or chest tightness, subjective wheeze or overt sinus or hb symptoms.   Sleeping on 2-3 pillows  without nocturnal  or early am exacerbation  of respiratory  c/o's or need for noct saba. Also denies any obvious fluctuation of symptoms with weather or environmental changes or other aggravating or alleviating factors except as outlined above   No unusual exposure hx or h/o childhood pna/ asthma or knowledge of premature birth.  Current Allergies, Complete Past Medical History, Past Surgical History, Family History, and Social History were reviewed in [employer] record.  ROS  The following are not active complaints unless bolded Hoarseness, sore throat, dysphagia, dental problems, itching, sneezing,  nasal congestion or discharge of excess mucus or purulent secretions, ear ache,   fever, chills, sweats, unintended wt loss or wt gain, classically pleuritic or exertional cp,  orthopnea pnd or arm/hand swelling  or leg swelling, presyncope, palpitations, abdominal pain, anorexia, nausea, vomiting, diarrhea  or change in bowel habits or change in bladder habits, change in stools or change in urine, dysuria, hematuria,  rash,  arthralgias, visual complaints, headache, numbness, weakness or ataxia or problems with walking or coordination,  change in mood or  memory.        Current Meds - list is not verified and likely not correct  Medication  Sig  . acetaminophen (TYLENOL) 325 MG tablet Per bottle as needed  . albuterol (PROVENTIL HFA;VENTOLIN HFA) 108 (90 BASE) MCG/ACT inhaler Inhale 2 puffs into the lungs every 4 (four) hours as needed for wheezing or shortness of breath. **PLAN B**  . ALPRAZolam (XANAX) 0.25 MG tablet 1/2-1 twice daily as needed  . atorvastatin (LIPITOR) 20 MG tablet Take 1 tablet (20 mg total) by mouth daily.  . budesonide-formoterol (SYMBICORT) 160-4.5 MCG/ACT inhaler Inhale 2 puffs into the lungs 2 (two) times daily.  Marland Kitchen guaiFENesin (MUCINEX) 600 MG 12 hr tablet Take 600 mg by mouth 2 (two) times daily as needed for cough or to loosen phlegm.   Marland Kitchen ibuprofen (ADVIL,MOTRIN) 600 MG tablet Take 600 mg by mouth every 6 (six) hours as needed.  Marland Kitchen ipratropium-albuterol (DUONEB) 0.5-2.5 (3) MG/3ML SOLN Take 3 mLs by nebulization every 4 (four) hours as needed (wheezing/shortness of breath). **PLAN C**  . mometasone-formoterol (DULERA) 200-5 MCG/ACT AERO Inhale into the lungs.  . nitroGLYCERIN (NITROSTAT) 0.4 MG SL tablet ONE TABLET UNDER TONGUE AS NEEDED FOR CHEST PAIN EVERY 5 MINUTES FOR 3 DOSES  . OXYGEN 2lpm 24/7  . Simethicone (GAS RELIEF) 180 MG CAPS Use as needed               Objective:   Physical Exam  Amb wf nad    12/09/2017         125   06/10/2017         119  01/08/2017         112   12/06/2016        110   02/15/16 119 lb (54 kg)  08/18/15 137 lb 12.8 oz (62.5 kg)  02/17/15 138 lb 1.9 oz (62.7 kg)      Vital signs reviewed - Note on arrival 02 sats  96% on RA            HEENT: nl dentition / oropharynx. Nl external ear canals without cough reflex - moderate bilateral non-specific turbinate edema     NECK :  without JVD/Nodes/TM/ nl carotid upstrokes bilaterally   LUNGS: no  acc muscle use,  Mod barrel  contour chest wall with bilateral  Distant bs s audible wheeze and  without cough on insp or exp maneuver and mod   Hyperresonant  to  percussion bilaterally     CV:  RRR  no s3 or murmur or increase in P2, and no edema   ABD:  soft and nontender with pos mid insp Hoover's  in the supine position. No bruits or organomegaly appreciated, bowel sounds nl  MS:   Nl gait/  ext warm without deformities, calf tenderness, cyanosis or clubbing No obvious joint restrictions   SKIN: warm and dry without lesions    NEURO:  alert, approp, nl sensorium with  no motor or cerebellar deficits apparent.           I personally reviewed images and agree with radiology impression as follows:   Chest CT w/contrast 10/24/17  1. Interval progression of interstitial and airspace disease in the anterior left upper lung. This could be related to evolving post radiation change, infection, or disease progression. Left upper lobe lung mass identified previously is now obscured by the background disease. 2. Interval progression of AP window lymphadenopathy consistent with metastatic disease. 3. Interval decrease in the splenic metastasis.            Assessment:

## 2017-12-09 NOTE — Patient Instructions (Addendum)
Continue  symbicort and add stiolto 2 pffs each am - - if you don't like it for any reason resume the symbicort alone as per your med calendar    Please schedule a follow up visit in 3 months but call sooner if needed  - add:  Pt notified above typo should have said add spiriva x 2 pffs each am and continue symbicort Take 2 puffs first thing in am and then another 2 puffs about 12 hours later.

## 2017-12-11 ENCOUNTER — Inpatient Hospital Stay: Payer: Medicare Other

## 2017-12-11 ENCOUNTER — Telehealth: Payer: Self-pay | Admitting: Oncology

## 2017-12-11 ENCOUNTER — Inpatient Hospital Stay: Payer: Medicare Other | Attending: Oncology

## 2017-12-11 ENCOUNTER — Inpatient Hospital Stay (HOSPITAL_BASED_OUTPATIENT_CLINIC_OR_DEPARTMENT_OTHER): Payer: Medicare Other | Admitting: Nurse Practitioner

## 2017-12-11 ENCOUNTER — Encounter: Payer: Self-pay | Admitting: Nurse Practitioner

## 2017-12-11 VITALS — BP 121/60 | HR 78 | Temp 98.0°F | Resp 18 | Ht 63.25 in | Wt 124.8 lb

## 2017-12-11 DIAGNOSIS — C7889 Secondary malignant neoplasm of other digestive organs: Secondary | ICD-10-CM

## 2017-12-11 DIAGNOSIS — C3412 Malignant neoplasm of upper lobe, left bronchus or lung: Secondary | ICD-10-CM | POA: Diagnosis not present

## 2017-12-11 DIAGNOSIS — Z923 Personal history of irradiation: Secondary | ICD-10-CM | POA: Insufficient documentation

## 2017-12-11 DIAGNOSIS — Z5112 Encounter for antineoplastic immunotherapy: Secondary | ICD-10-CM | POA: Insufficient documentation

## 2017-12-11 DIAGNOSIS — Z9981 Dependence on supplemental oxygen: Secondary | ICD-10-CM | POA: Insufficient documentation

## 2017-12-11 DIAGNOSIS — I252 Old myocardial infarction: Secondary | ICD-10-CM | POA: Diagnosis not present

## 2017-12-11 DIAGNOSIS — J440 Chronic obstructive pulmonary disease with acute lower respiratory infection: Secondary | ICD-10-CM

## 2017-12-11 DIAGNOSIS — J9600 Acute respiratory failure, unspecified whether with hypoxia or hypercapnia: Secondary | ICD-10-CM

## 2017-12-11 DIAGNOSIS — C3490 Malignant neoplasm of unspecified part of unspecified bronchus or lung: Secondary | ICD-10-CM

## 2017-12-11 LAB — CBC WITH DIFFERENTIAL (CANCER CENTER ONLY)
BASOS PCT: 0 %
Basophils Absolute: 0 10*3/uL (ref 0.0–0.1)
EOS ABS: 0.2 10*3/uL (ref 0.0–0.5)
EOS PCT: 3 %
HCT: 39.8 % (ref 34.8–46.6)
Hemoglobin: 12.8 g/dL (ref 11.6–15.9)
Lymphocytes Relative: 18 %
Lymphs Abs: 1.1 10*3/uL (ref 0.9–3.3)
MCH: 27.5 pg (ref 25.1–34.0)
MCHC: 32.2 g/dL (ref 31.5–36.0)
MCV: 85.6 fL (ref 79.5–101.0)
MONO ABS: 0.6 10*3/uL (ref 0.1–0.9)
MONOS PCT: 10 %
NEUTROS PCT: 69 %
Neutro Abs: 4.3 10*3/uL (ref 1.5–6.5)
PLATELETS: 261 10*3/uL (ref 145–400)
RBC: 4.65 MIL/uL (ref 3.70–5.45)
RDW: 14.2 % (ref 11.2–14.5)
WBC Count: 6.2 10*3/uL (ref 3.9–10.3)

## 2017-12-11 LAB — TSH: TSH: 1.063 u[IU]/mL (ref 0.308–3.960)

## 2017-12-11 LAB — CMP (CANCER CENTER ONLY)
ALBUMIN: 3.3 g/dL — AB (ref 3.5–5.0)
ALT: 14 U/L (ref 0–44)
AST: 16 U/L (ref 15–41)
Alkaline Phosphatase: 105 U/L (ref 38–126)
Anion gap: 10 (ref 5–15)
BUN: 17 mg/dL (ref 8–23)
CHLORIDE: 105 mmol/L (ref 98–111)
CO2: 25 mmol/L (ref 22–32)
Calcium: 9.4 mg/dL (ref 8.9–10.3)
Creatinine: 0.64 mg/dL (ref 0.44–1.00)
GFR, Estimated: >60 mL/min (ref >60)
GLUCOSE: 98 mg/dL (ref 70–99)
Potassium: 4.3 mmol/L (ref 3.5–5.1)
SODIUM: 140 mmol/L (ref 135–145)
Total Bilirubin: 0.3 mg/dL (ref 0.3–1.2)
Total Protein: 7.4 g/dL (ref 6.5–8.1)

## 2017-12-11 MED ORDER — SODIUM CHLORIDE 0.9 % IV SOLN
Freq: Once | INTRAVENOUS | Status: AC
  Administered 2017-12-11: 14:00:00 via INTRAVENOUS
  Filled 2017-12-11: qty 250

## 2017-12-11 MED ORDER — PEMBROLIZUMAB CHEMO INJECTION 100 MG/4ML
200.0000 mg | Freq: Once | INTRAVENOUS | Status: AC
  Administered 2017-12-11: 200 mg via INTRAVENOUS
  Filled 2017-12-11: qty 8

## 2017-12-11 NOTE — Patient Instructions (Signed)
[facility] Discharge Instructions for Patients Receiving Chemotherapy  Today you received the following chemotherapy agents:  Keytruda.  To help prevent nausea and vomiting after your treatment, we encourage you to take your nausea medication as directed.   If you develop nausea and vomiting that is not controlled by your nausea medication, call the clinic.   BELOW ARE SYMPTOMS THAT SHOULD BE REPORTED IMMEDIATELY:  *FEVER GREATER THAN 100.5 F  *CHILLS WITH OR WITHOUT FEVER  NAUSEA AND VOMITING THAT IS NOT CONTROLLED WITH YOUR NAUSEA MEDICATION  *UNUSUAL SHORTNESS OF BREATH  *UNUSUAL BRUISING OR BLEEDING  TENDERNESS IN MOUTH AND THROAT WITH OR WITHOUT PRESENCE OF ULCERS  *URINARY PROBLEMS  *BOWEL PROBLEMS  UNUSUAL RASH Items with * indicate a potential emergency and should be followed up as soon as possible.  Feel free to call the clinic should you have any questions or concerns. The clinic phone number is [phone].  Please show the CHEMO ALERT CARD at check-in to the Emergency Department and triage nurse.    

## 2017-12-11 NOTE — Progress Notes (Signed)
[facility] OFFICE PROGRESS NOTE   Diagnosis: Non-small cell lung cancer  INTERVAL HISTORY:   Brooke [name] returns as scheduled.  She completed another cycle of Pembrolizumab 11/21/2017.  She denies nausea/vomiting.  No mouth sores except related to biting the inside of her mouth.  This has resolved.  No diarrhea.  No rash.  She recently noted a few bug bites which have nearly resolved.  She continue supplemental oxygen with activity.  She has no shortness of breath at rest.  She recently tried Spiriva but discontinued due to side effects.  Objective:  Vital signs in last 24 hours:  Blood pressure 121/60, pulse 78, temperature 98 F (36.7 C), temperature source Oral, resp. rate 18, height 5' 3.25" (1.607 m), weight 124 lb 12.8 oz (56.6 kg), SpO2 99 %.    HEENT: No thrush or ulcers. Resp: Lungs clear bilaterally. Cardio: Regular rate and rhythm. GI: Abdomen soft and nontender.  No hepatomegaly. Vascular: No leg edema. Neuro: Alert and oriented. Skin: No rash.  Small ecchymosis right outer wrist.   Lab Results:  Lab Results  Component Value Date   WBC 6.2 12/11/2017   HGB 12.8 12/11/2017   HCT 39.8 12/11/2017   MCV 85.6 12/11/2017   PLT 261 12/11/2017   NEUTROABS 4.3 12/11/2017    Imaging:  No results found.  Medications: I have reviewed the patient's current medications.  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  PDL1 60%  Left lung radiation 11/08/2016 through 11/21/2016  CT chest 12/12/2016-reexpansion of left upper lobe with  a decreased left upper lobe mass, unchanged mediastinal adenopathy, progression of a splenic metastasis/pancreatic tail/gastrohepatic ligament metastasis, right lower lobe pneumonia  Cycle 1 Pembrolizumab 12/14/2016  Cycle 2 Pembrolizumab 01/03/2017  Cycle 3 Pembrolizumab 01/24/2017  Cycle 4 Pembrolizumab 02/12/2017  CT 03/05/2018-decrease in left upper lobe mass, mediastinal adenopathy, and splenic mass  Cycle 5 pembrolizumab 03/07/2017  Cycle 6 Pembrolizumab 04/02/2017  Cycle 7 pembrolizumab 04/25/2017  Cycle 8 Pembrolizumab 05/14/2017  Cycle 9 Pembrolizumab 06/06/2017  CT 06/20/2017-mild decrease in left upper lobe mass, mild increase in paramediastinal left upper lobe nodule-radiation change?, decreased splenic metastasis  Cycle 10 pembrolizumab 06/25/2017  Cycle 11 pembrolizumab 07/18/2017  Cycle 12 pembrolizumab 08/29/2017  Cycle 13 pembrolizumab 09/17/2017  Cycle 14 Pembrolizumab 10/10/2017  CT chest 10/24/2017- slight enlargement of small mediastinal lymph nodes, increased left upper lobe consolidation, decreased size of spleen lesion  Cycle 15 pembrolizumab 10/29/2017  Cycle 16 Pembrolizumab 11/21/2017  Cycle 17 Pembrolizumab 12/11/2017  2. Acute respiratory failure secondary to #1  3. Oxygen dependent COPD  4. History of a NSTEMI 2015  5. Right lung pneumonia on chest CT 12/12/2016-treated with Levaquin  6.Grade 2 rash possibly related to Pembrolizumab. Treatment held 08/06/2017.Improved 08/29/2017, treatment resumed, rash has resolved    Disposition: Brooke [name] appears stable.  She has completed 16 cycles of Pembrolizumab.  There is no clinical evidence of disease progression.  Plan to proceed with cycle 17 today as scheduled.  She will return for lab, follow-up and the next cycle of Pembrolizumab in 3 weeks.  She will contact the office in the interim with any problems.    [name]  Luisfelipe Engelstad ANP/GNP-BC   12/11/2017  9:38 AM

## 2017-12-11 NOTE — Telephone Encounter (Signed)
Appointments scheduled AVS/Calendar printed per 8/7 los

## 2017-12-12 ENCOUNTER — Encounter: Payer: Self-pay | Admitting: Internal Medicine

## 2017-12-12 ENCOUNTER — Telehealth: Payer: Self-pay | Admitting: *Deleted

## 2017-12-12 NOTE — Telephone Encounter (Signed)
Ok to leave off spiriva

## 2017-12-12 NOTE — Assessment & Plan Note (Signed)
See CT Chest 01/26/16 c/w ? Stage  lung ca arising in LUL - PET 02/28/16 1. Large lobular hypermetabolic mass in the LEFT upper lobe abutting the oblique fissure consists with primary bronchogenic carcinoma. 2. Evidence of ipsilateral hypermetabolic mediastinal nodal Metastasis only > 02/29/2016  referred for EBUS/ Dr [place] Bells > declined  -11/01/16   CT directed bx  Pos [place] LUL  > RT palliative completed 11/21/16 / rx per oncology = Keytruda    Ct reviewed and c/w RT effects/ f/u per RT/ oncology planned

## 2017-12-12 NOTE — Assessment & Plan Note (Addendum)
Spirometry 02/15/2016  FEV1 0.60 (28%)  Ratio 38 with classic curvature  p am symbicort 160  - 01/08/2017    continue dulera 200 2bid    - 12/09/2017  After extensive coaching inhaler device  effectiveness =    90% with smi     Group D in terms of symptom/risk and laba/lama/ICS  therefore appropriate rx at this point so will add spiriva 2.5 2 pffs each am if tolerated/ covered by insurance   I had an extended discussion with the patient reviewing all relevant studies completed to date and  lasting 15 to 20 minutes of a 25 minute visit    See device teaching which extended face to face time for this visit.  Discussed: Formulary restrictions will be an ongoing challenge for the forseable future and I would be happy to pick an alternative if the pt will first  provide me a list of them but pt  will need to return here for training for any new device that is required eg dpi vs hfa vs respimat.    In meantime we can always provide samples so the patient never runs out of any needed respiratory medications.   Each maintenance medication was reviewed in detail including emphasizing most importantly the difference between maintenance and prns and under what circumstances the prns are to be triggered using an action plan format that is not reflected in the computer generated alphabetically organized AVS which I have not found useful in most complex patients, especially with respiratory illnesses  Please see AVS for specific instructions unique to this visit that I personally wrote and verbalized to the the pt in detail and then reviewed with pt  by my nurse highlighting any  changes in therapy recommended at today's visit to their plan of care.

## 2017-12-12 NOTE — Telephone Encounter (Signed)
Spoke with the pt  She states that she is only taking symbicort now  She used the spiriva respimat while in office and after she left she had dry mouth, trouble urinating, "felt like rock in my lung" Please advise thanks

## 2017-12-12 NOTE — Telephone Encounter (Signed)
-----   Message from Tanda Rockers, MD sent at 12/12/2017  6:42 AM EDT ----- There was error on instruction sheet, should have said d/c symbicort while on stiolto trial

## 2017-12-12 NOTE — Telephone Encounter (Signed)
Left her a detailed msg on machine ok per [facility]

## 2017-12-21 ENCOUNTER — Other Ambulatory Visit: Payer: Self-pay | Admitting: Physician Assistant

## 2017-12-30 ENCOUNTER — Other Ambulatory Visit: Payer: Self-pay | Admitting: Oncology

## 2018-01-02 ENCOUNTER — Inpatient Hospital Stay (HOSPITAL_BASED_OUTPATIENT_CLINIC_OR_DEPARTMENT_OTHER): Payer: Medicare Other | Admitting: Oncology

## 2018-01-02 ENCOUNTER — Telehealth: Payer: Self-pay

## 2018-01-02 ENCOUNTER — Inpatient Hospital Stay: Payer: Medicare Other

## 2018-01-02 VITALS — BP 131/62 | HR 80 | Temp 98.1°F | Resp 18 | Ht 63.25 in | Wt 125.3 lb

## 2018-01-02 DIAGNOSIS — C7889 Secondary malignant neoplasm of other digestive organs: Secondary | ICD-10-CM | POA: Diagnosis not present

## 2018-01-02 DIAGNOSIS — J440 Chronic obstructive pulmonary disease with acute lower respiratory infection: Secondary | ICD-10-CM

## 2018-01-02 DIAGNOSIS — I252 Old myocardial infarction: Secondary | ICD-10-CM

## 2018-01-02 DIAGNOSIS — J9600 Acute respiratory failure, unspecified whether with hypoxia or hypercapnia: Secondary | ICD-10-CM

## 2018-01-02 DIAGNOSIS — Z923 Personal history of irradiation: Secondary | ICD-10-CM

## 2018-01-02 DIAGNOSIS — Z9981 Dependence on supplemental oxygen: Secondary | ICD-10-CM

## 2018-01-02 DIAGNOSIS — Z5112 Encounter for antineoplastic immunotherapy: Secondary | ICD-10-CM | POA: Diagnosis not present

## 2018-01-02 DIAGNOSIS — C3412 Malignant neoplasm of upper lobe, left bronchus or lung: Secondary | ICD-10-CM

## 2018-01-02 MED ORDER — SODIUM CHLORIDE 0.9 % IV SOLN
200.0000 mg | Freq: Once | INTRAVENOUS | Status: AC
  Administered 2018-01-02: 200 mg via INTRAVENOUS
  Filled 2018-01-02: qty 8

## 2018-01-02 MED ORDER — SODIUM CHLORIDE 0.9 % IV SOLN
Freq: Once | INTRAVENOUS | Status: AC
  Administered 2018-01-02: 10:00:00 via INTRAVENOUS
  Filled 2018-01-02: qty 250

## 2018-01-02 NOTE — Progress Notes (Signed)
[place] OFFICE PROGRESS NOTE   Diagnosis: Non-small cell lung cancer  INTERVAL HISTORY:   Ms. Transue returns as scheduled.  She completed another treatment with pembrolizumab on 12/11/2017.  No rash.  She had 2 episodes of vomiting yesterday morning.  She relates this to eating old bacon.  She feels well today.  Good energy level.  No increase in dyspnea.  Objective:  Vital signs in last 24 hours:  Blood pressure 131/62, pulse 80, temperature 98.1 F (36.7 C), temperature source Oral, resp. rate 18, height 5' 3.25" (1.607 m), weight 125 lb 4.8 oz (56.8 kg), SpO2 96 %.    HEENT: No thrush Resp: Lungs clear bilaterally Cardio: Regular rate and rhythm GI: No hepatosplenomegaly, soft, nontender Vascular: No leg edema  Skin: Faint erythema at the knees and distal thigh bilaterally, no other rash  Portacath/PICC-without erythema  Lab Results:  Lab Results  Component Value Date   WBC 6.2 12/11/2017   HGB 12.8 12/11/2017   HCT 39.8 12/11/2017   MCV 85.6 12/11/2017   PLT 261 12/11/2017   NEUTROABS 4.3 12/11/2017    CMP  Lab Results  Component Value Date   NA 140 12/11/2017   K 4.3 12/11/2017   CL 105 12/11/2017   CO2 25 12/11/2017   GLUCOSE 98 12/11/2017   BUN 17 12/11/2017   CREATININE 0.64 12/11/2017   CALCIUM 9.4 12/11/2017   PROT 7.4 12/11/2017   ALBUMIN 3.3 (L) 12/11/2017   AST 16 12/11/2017   ALT 14 12/11/2017   ALKPHOS 105 12/11/2017   BILITOT 0.3 12/11/2017   GFRNONAA >60 12/11/2017   GFRAA >60 12/11/2017     Medications: I have reviewed the patient's current medications.   Assessment/Plan: 1.Left lung mass  PET scan 02/28/2016-hypermetabolic left upper lobe mass, hypermetabolic adjacent nodule, hypermetabolic AP window node  CT chest 10/28/2016-enlarging left upper lobe mass, increased AP window lymphadenopathy, new spleen metastasis, new adenopathy at the pancreas tail, upper abdomen, and middle mediastinum  CT-guided biopsy  of the left lung mass on 11/01/2016, Non-small cell carcinoma most consistent with squamous cell carcinoma  PDL1 60%  Left lung radiation 11/08/2016 through 11/21/2016  CT chest 12/12/2016-reexpansion of left upper lobe with a decreased left upper lobe mass, unchanged mediastinal adenopathy, progression of a splenic metastasis/pancreatic tail/gastrohepatic ligament metastasis, right lower lobe pneumonia  Cycle 1 Pembrolizumab 12/14/2016  Cycle 2 Pembrolizumab 01/03/2017  Cycle 3 Pembrolizumab 01/24/2017  Cycle 4 Pembrolizumab 02/12/2017  CT 03/05/2018-decrease in left upper lobe mass, mediastinal adenopathy, and splenic mass  Cycle 5 pembrolizumab 03/07/2017  Cycle 6 Pembrolizumab 04/02/2017  Cycle 7 pembrolizumab 04/25/2017  Cycle 8 Pembrolizumab 05/14/2017  Cycle 9 Pembrolizumab 06/06/2017  CT 06/20/2017-mild decrease in left upper lobe mass, mild increase in paramediastinal left upper lobe nodule-radiation change?, decreased splenic metastasis  Cycle 10 pembrolizumab 06/25/2017  Cycle 11 pembrolizumab 07/18/2017  Cycle 12 pembrolizumab 08/29/2017  Cycle 13 pembrolizumab 09/17/2017  Cycle 14 Pembrolizumab 10/10/2017  CT chest 10/24/2017-slight enlargement of small mediastinal lymph nodes, increased left upper lobe consolidation, decreased size of spleen lesion  Cycle 15 pembrolizumab 10/29/2017  Cycle 16 Pembrolizumab 11/21/2017  Cycle 17 Pembrolizumab 12/11/2017  Cycle 18 pembrolizumab 01/02/2018  2. Acute respiratory failure secondary to #1  3. Oxygen dependent COPD  4. History of a NSTEMI 2015  5. Right lung pneumonia on chest CT 12/12/2016-treated with Levaquin  6.Grade 2 rash possibly related to Pembrolizumab. Treatment held 08/06/2017.Improved 08/29/2017, treatment resumed, rash has resolved   Disposition: Ms. Bir appears stable.  She will continue pembrolizumab.  She will return for an office visit, labs, and pembrolizumab in 3 weeks.  We will  plan for a restaging CT after the 02/13/2018 treatment  15 minutes were spent with the patient today.  The majority of the time was used for counseling and coordination of care.  Betsy Coder, MD  01/02/2018  8:54 AM

## 2018-01-02 NOTE — Progress Notes (Signed)
Okay to treat with Keytruda with no labs today per [name].

## 2018-01-02 NOTE — Telephone Encounter (Signed)
Printed avs and calender of upcoming appointment. Er 8/29 los

## 2018-01-02 NOTE — Patient Instructions (Signed)
[facility] Discharge Instructions for Patients Receiving Chemotherapy  Today you received the following chemotherapy agents:  Keytruda.  To help prevent nausea and vomiting after your treatment, we encourage you to take your nausea medication as directed.   If you develop nausea and vomiting that is not controlled by your nausea medication, call the clinic.   BELOW ARE SYMPTOMS THAT SHOULD BE REPORTED IMMEDIATELY:  *FEVER GREATER THAN 100.5 F  *CHILLS WITH OR WITHOUT FEVER  NAUSEA AND VOMITING THAT IS NOT CONTROLLED WITH YOUR NAUSEA MEDICATION  *UNUSUAL SHORTNESS OF BREATH  *UNUSUAL BRUISING OR BLEEDING  TENDERNESS IN MOUTH AND THROAT WITH OR WITHOUT PRESENCE OF ULCERS  *URINARY PROBLEMS  *BOWEL PROBLEMS  UNUSUAL RASH Items with * indicate a potential emergency and should be followed up as soon as possible.  Feel free to call the clinic should you have any questions or concerns. The clinic phone number is [phone].  Please show the CHEMO ALERT CARD at check-in to the Emergency Department and triage nurse.    

## 2018-01-10 ENCOUNTER — Other Ambulatory Visit: Payer: Self-pay | Admitting: Family Medicine

## 2018-01-10 DIAGNOSIS — Z1231 Encounter for screening mammogram for malignant neoplasm of breast: Secondary | ICD-10-CM

## 2018-01-19 ENCOUNTER — Other Ambulatory Visit: Payer: Self-pay | Admitting: Oncology

## 2018-01-21 ENCOUNTER — Telehealth: Payer: Self-pay

## 2018-01-21 ENCOUNTER — Encounter: Payer: Self-pay | Admitting: Nurse Practitioner

## 2018-01-21 ENCOUNTER — Inpatient Hospital Stay: Payer: Medicare Other | Attending: Oncology

## 2018-01-21 ENCOUNTER — Inpatient Hospital Stay (HOSPITAL_BASED_OUTPATIENT_CLINIC_OR_DEPARTMENT_OTHER): Payer: Medicare Other | Admitting: Nurse Practitioner

## 2018-01-21 ENCOUNTER — Inpatient Hospital Stay: Payer: Medicare Other

## 2018-01-21 VITALS — BP 122/69 | HR 82 | Temp 98.2°F | Resp 18 | Ht 63.25 in | Wt 125.1 lb

## 2018-01-21 DIAGNOSIS — C3412 Malignant neoplasm of upper lobe, left bronchus or lung: Secondary | ICD-10-CM

## 2018-01-21 DIAGNOSIS — J440 Chronic obstructive pulmonary disease with acute lower respiratory infection: Secondary | ICD-10-CM

## 2018-01-21 DIAGNOSIS — Z9981 Dependence on supplemental oxygen: Secondary | ICD-10-CM

## 2018-01-21 DIAGNOSIS — I252 Old myocardial infarction: Secondary | ICD-10-CM | POA: Diagnosis not present

## 2018-01-21 DIAGNOSIS — C7889 Secondary malignant neoplasm of other digestive organs: Secondary | ICD-10-CM

## 2018-01-21 DIAGNOSIS — J9600 Acute respiratory failure, unspecified whether with hypoxia or hypercapnia: Secondary | ICD-10-CM

## 2018-01-21 DIAGNOSIS — C3490 Malignant neoplasm of unspecified part of unspecified bronchus or lung: Secondary | ICD-10-CM

## 2018-01-21 DIAGNOSIS — D7389 Other diseases of spleen: Secondary | ICD-10-CM | POA: Diagnosis not present

## 2018-01-21 DIAGNOSIS — Z5112 Encounter for antineoplastic immunotherapy: Secondary | ICD-10-CM | POA: Insufficient documentation

## 2018-01-21 LAB — CBC WITH DIFFERENTIAL (CANCER CENTER ONLY)
Basophils Absolute: 0 10*3/uL (ref 0.0–0.1)
Basophils Relative: 0 %
EOS ABS: 0.1 10*3/uL (ref 0.0–0.5)
Eosinophils Relative: 2 %
HEMATOCRIT: 40.4 % (ref 34.8–46.6)
HEMOGLOBIN: 13 g/dL (ref 11.6–15.9)
LYMPHS ABS: 1.1 10*3/uL (ref 0.9–3.3)
LYMPHS PCT: 19 %
MCH: 27.3 pg (ref 25.1–34.0)
MCHC: 32.2 g/dL (ref 31.5–36.0)
MCV: 84.9 fL (ref 79.5–101.0)
MONOS PCT: 9 %
Monocytes Absolute: 0.5 10*3/uL (ref 0.1–0.9)
NEUTROS ABS: 4.1 10*3/uL (ref 1.5–6.5)
NEUTROS PCT: 70 %
Platelet Count: 259 10*3/uL (ref 145–400)
RBC: 4.76 MIL/uL (ref 3.70–5.45)
RDW: 14.8 % — ABNORMAL HIGH (ref 11.2–14.5)
WBC Count: 5.9 10*3/uL (ref 3.9–10.3)

## 2018-01-21 LAB — TSH: TSH: 0.813 u[IU]/mL (ref 0.308–3.960)

## 2018-01-21 LAB — CMP (CANCER CENTER ONLY)
ALT: 17 U/L (ref 0–44)
AST: 17 U/L (ref 15–41)
Albumin: 3.5 g/dL (ref 3.5–5.0)
Alkaline Phosphatase: 126 U/L (ref 38–126)
Anion gap: 7 (ref 5–15)
BUN: 14 mg/dL (ref 8–23)
CO2: 27 mmol/L (ref 22–32)
CREATININE: 0.62 mg/dL (ref 0.44–1.00)
Calcium: 9.9 mg/dL (ref 8.9–10.3)
Chloride: 104 mmol/L (ref 98–111)
GFR, Estimated: >60 mL/min (ref >60)
Glucose, Bld: 97 mg/dL (ref 70–99)
POTASSIUM: 4.5 mmol/L (ref 3.5–5.1)
SODIUM: 138 mmol/L (ref 135–145)
Total Bilirubin: 0.4 mg/dL (ref 0.3–1.2)
Total Protein: 7.7 g/dL (ref 6.5–8.1)

## 2018-01-21 MED ORDER — SODIUM CHLORIDE 0.9 % IV SOLN
200.0000 mg | Freq: Once | INTRAVENOUS | Status: AC
  Administered 2018-01-21: 200 mg via INTRAVENOUS
  Filled 2018-01-21: qty 8

## 2018-01-21 MED ORDER — SODIUM CHLORIDE 0.9 % IV SOLN
Freq: Once | INTRAVENOUS | Status: AC
  Administered 2018-01-21: 13:00:00 via INTRAVENOUS
  Filled 2018-01-21: qty 250

## 2018-01-21 NOTE — Progress Notes (Signed)
  [facility] OFFICE PROGRESS NOTE   Diagnosis: Non-small cell lung cancer  INTERVAL HISTORY:   Ms. Croson returns as scheduled.  She completed another cycle of Pembrolizumab 01/02/2018.  She denies nausea/vomiting.  No mouth sores.  No diarrhea.  No rash.  She continues to have dyspnea on exertion.  No significant dyspnea at rest.  She continue supplemental oxygen as needed.  She has a good appetite.  Objective:  Vital signs in last 24 hours:  Blood pressure 122/69, pulse 82, temperature 98.2 F (36.8 C), temperature source Oral, resp. rate 18, height 5' 3.25" (1.607 m), weight 125 lb 1.6 oz (56.7 kg), SpO2 98 %.    HEENT: No thrush or ulcers. Resp: Distant breath sounds. Cardio: Regular rate and rhythm. GI: Abdomen soft and nontender.  No hepatomegaly. Vascular: No leg edema.  Calves soft and nontender. Neuro: Alert and oriented. Skin: No rash.   Lab Results:  Lab Results  Component Value Date   WBC 5.9 01/21/2018   HGB 13.0 01/21/2018   HCT 40.4 01/21/2018   MCV 84.9 01/21/2018   PLT 259 01/21/2018   NEUTROABS 4.1 01/21/2018    Imaging:  No results found.  Medications: I have reviewed the patient's current medications.  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  PDL1 60%  Left lung radiation 11/08/2016 through 11/21/2016  CT chest 12/12/2016-reexpansion of left upper lobe with a decreased left upper lobe mass, unchanged mediastinal adenopathy, progression of a splenic metastasis/pancreatic tail/gastrohepatic ligament  metastasis, right lower lobe pneumonia  Cycle 1 Pembrolizumab 12/14/2016  Cycle 2 Pembrolizumab 01/03/2017  Cycle 3 Pembrolizumab 01/24/2017  Cycle 4 Pembrolizumab 02/12/2017  CT 03/05/2018-decrease in left upper lobe mass, mediastinal adenopathy, and splenic mass  Cycle 5 pembrolizumab 03/07/2017  Cycle 6 Pembrolizumab 04/02/2017  Cycle 7 pembrolizumab 04/25/2017  Cycle 8 Pembrolizumab 05/14/2017  Cycle 9 Pembrolizumab 06/06/2017  CT 06/20/2017-mild decrease in left upper lobe mass, mild increase in paramediastinal left upper lobe nodule-radiation change?, decreased splenic metastasis  Cycle 10 pembrolizumab 06/25/2017  Cycle 11 pembrolizumab 07/18/2017  Cycle 12 pembrolizumab 08/29/2017  Cycle 13 pembrolizumab 09/17/2017  Cycle 14 Pembrolizumab 10/10/2017  CT chest 10/24/2017-slight enlargement of small mediastinal lymph nodes, increased left upper lobe consolidation, decreased size of spleen lesion  Cycle 15 pembrolizumab 10/29/2017  Cycle 16 Pembrolizumab 11/21/2017  Cycle 17 Pembrolizumab 12/11/2017  Cycle 18 pembrolizumab 01/02/2018  Cycle 19 Pembrolizumab 01/21/2018  2. Acute respiratory failure secondary to #1  3. Oxygen dependent COPD  4. History of a NSTEMI 2015  5. Right lung pneumonia on chest CT 12/12/2016-treated with Levaquin  6.Grade 2 rash possibly related to Pembrolizumab. Treatment held 08/06/2017.Improved 08/29/2017, treatment resumed, rash has resolved   Disposition: Brooke [name] appears stable.  She has completed 18 cycles of Pembrolizumab.  Plan to proceed with cycle 19 today as scheduled.  The plan is to refer for restaging CTs after the 02/13/2018 cycle of Pembrolizumab.  She will return for lab, follow-up and Pembrolizumab on 02/13/2018.  She will contact the office in the interim with any problems.    [name] ANP/GNP-BC   01/21/2018  12:13 PM

## 2018-01-21 NOTE — Telephone Encounter (Signed)
Printed avs and calender of upcoming appointment. Per 9/17 los

## 2018-01-21 NOTE — Patient Instructions (Signed)
[facility] Discharge Instructions for Patients Receiving Chemotherapy  Today you received the following chemotherapy agents:  Keytruda.  To help prevent nausea and vomiting after your treatment, we encourage you to take your nausea medication as directed.   If you develop nausea and vomiting that is not controlled by your nausea medication, call the clinic.   BELOW ARE SYMPTOMS THAT SHOULD BE REPORTED IMMEDIATELY:  *FEVER GREATER THAN 100.5 F  *CHILLS WITH OR WITHOUT FEVER  NAUSEA AND VOMITING THAT IS NOT CONTROLLED WITH YOUR NAUSEA MEDICATION  *UNUSUAL SHORTNESS OF BREATH  *UNUSUAL BRUISING OR BLEEDING  TENDERNESS IN MOUTH AND THROAT WITH OR WITHOUT PRESENCE OF ULCERS  *URINARY PROBLEMS  *BOWEL PROBLEMS  UNUSUAL RASH Items with * indicate a potential emergency and should be followed up as soon as possible.  Feel free to call the clinic should you have any questions or concerns. The clinic phone number is [phone].  Please show the CHEMO ALERT CARD at check-in to the Emergency Department and triage nurse.    

## 2018-02-08 ENCOUNTER — Other Ambulatory Visit: Payer: Self-pay | Admitting: Oncology

## 2018-02-13 ENCOUNTER — Inpatient Hospital Stay (HOSPITAL_BASED_OUTPATIENT_CLINIC_OR_DEPARTMENT_OTHER): Payer: Medicare Other | Admitting: Nurse Practitioner

## 2018-02-13 ENCOUNTER — Inpatient Hospital Stay: Payer: Medicare Other | Attending: Oncology

## 2018-02-13 ENCOUNTER — Encounter: Payer: Self-pay | Admitting: Nurse Practitioner

## 2018-02-13 ENCOUNTER — Telehealth: Payer: Self-pay | Admitting: Nurse Practitioner

## 2018-02-13 ENCOUNTER — Inpatient Hospital Stay: Payer: Medicare Other

## 2018-02-13 VITALS — BP 128/78 | HR 79 | Temp 97.8°F | Resp 18 | Ht 63.25 in | Wt 126.6 lb

## 2018-02-13 DIAGNOSIS — J440 Chronic obstructive pulmonary disease with acute lower respiratory infection: Secondary | ICD-10-CM | POA: Insufficient documentation

## 2018-02-13 DIAGNOSIS — J9600 Acute respiratory failure, unspecified whether with hypoxia or hypercapnia: Secondary | ICD-10-CM | POA: Insufficient documentation

## 2018-02-13 DIAGNOSIS — Z9981 Dependence on supplemental oxygen: Secondary | ICD-10-CM | POA: Insufficient documentation

## 2018-02-13 DIAGNOSIS — C7889 Secondary malignant neoplasm of other digestive organs: Secondary | ICD-10-CM

## 2018-02-13 DIAGNOSIS — C3412 Malignant neoplasm of upper lobe, left bronchus or lung: Secondary | ICD-10-CM | POA: Insufficient documentation

## 2018-02-13 DIAGNOSIS — Z5112 Encounter for antineoplastic immunotherapy: Secondary | ICD-10-CM | POA: Insufficient documentation

## 2018-02-13 DIAGNOSIS — I252 Old myocardial infarction: Secondary | ICD-10-CM | POA: Insufficient documentation

## 2018-02-13 LAB — CBC WITH DIFFERENTIAL (CANCER CENTER ONLY)
Abs Immature Granulocytes: 0.02 10*3/uL (ref 0.00–0.07)
BASOS PCT: 0 %
Basophils Absolute: 0 10*3/uL (ref 0.0–0.1)
EOS ABS: 0.2 10*3/uL (ref 0.0–0.5)
Eosinophils Relative: 3 %
HEMATOCRIT: 40.7 % (ref 36.0–46.0)
Hemoglobin: 12.9 g/dL (ref 12.0–15.0)
IMMATURE GRANULOCYTES: 0 %
LYMPHS ABS: 1.2 10*3/uL (ref 0.7–4.0)
Lymphocytes Relative: 16 %
MCH: 27 pg (ref 26.0–34.0)
MCHC: 31.7 g/dL (ref 30.0–36.0)
MCV: 85.3 fL (ref 80.0–100.0)
MONO ABS: 0.6 10*3/uL (ref 0.1–1.0)
MONOS PCT: 8 %
Neutro Abs: 5.2 10*3/uL (ref 1.7–7.7)
Neutrophils Relative %: 73 %
Platelet Count: 253 10*3/uL (ref 150–400)
RBC: 4.77 MIL/uL (ref 3.87–5.11)
RDW: 14.7 % (ref 11.5–15.5)
WBC Count: 7.2 10*3/uL (ref 4.0–10.5)
nRBC: 0 % (ref 0.0–0.2)

## 2018-02-13 LAB — CMP (CANCER CENTER ONLY)
ALBUMIN: 3.4 g/dL — AB (ref 3.5–5.0)
ALK PHOS: 133 U/L — AB (ref 38–126)
ALT: 15 U/L (ref 0–44)
AST: 17 U/L (ref 15–41)
Anion gap: 10 (ref 5–15)
BILIRUBIN TOTAL: 0.3 mg/dL (ref 0.3–1.2)
BUN: 12 mg/dL (ref 8–23)
CALCIUM: 9.6 mg/dL (ref 8.9–10.3)
CO2: 25 mmol/L (ref 22–32)
CREATININE: 0.62 mg/dL (ref 0.44–1.00)
Chloride: 105 mmol/L (ref 98–111)
GFR, Est AFR Am: >60 mL/min (ref >60)
GFR, Estimated: >60 mL/min (ref >60)
GLUCOSE: 91 mg/dL (ref 70–99)
Potassium: 4.1 mmol/L (ref 3.5–5.1)
SODIUM: 140 mmol/L (ref 135–145)
TOTAL PROTEIN: 7.6 g/dL (ref 6.5–8.1)

## 2018-02-13 MED ORDER — SODIUM CHLORIDE 0.9 % IV SOLN
Freq: Once | INTRAVENOUS | Status: AC
  Administered 2018-02-13: 11:00:00 via INTRAVENOUS
  Filled 2018-02-13: qty 250

## 2018-02-13 MED ORDER — SODIUM CHLORIDE 0.9 % IV SOLN
200.0000 mg | Freq: Once | INTRAVENOUS | Status: AC
  Administered 2018-02-13: 200 mg via INTRAVENOUS
  Filled 2018-02-13: qty 8

## 2018-02-13 NOTE — Patient Instructions (Addendum)
[facility] Discharge Instructions for Patients Receiving Chemotherapy  Today you received the following chemotherapy agents: Keytruda.  To help prevent nausea and vomiting after your treatment, we encourage you to take your nausea medication as directed  If you develop nausea and vomiting that is not controlled by your nausea medication, call the clinic.   BELOW ARE SYMPTOMS THAT SHOULD BE REPORTED IMMEDIATELY:  *FEVER GREATER THAN 100.5 F  *CHILLS WITH OR WITHOUT FEVER  NAUSEA AND VOMITING THAT IS NOT CONTROLLED WITH YOUR NAUSEA MEDICATION  *UNUSUAL SHORTNESS OF BREATH  *UNUSUAL BRUISING OR BLEEDING  TENDERNESS IN MOUTH AND THROAT WITH OR WITHOUT PRESENCE OF ULCERS  *URINARY PROBLEMS  *BOWEL PROBLEMS  UNUSUAL RASH Items with * indicate a potential emergency and should be followed up as soon as possible.  Feel free to call the clinic should you have any questions or concerns. The clinic phone number is [phone].  Please show the [place] at check-in to the Emergency Department and triage nurse.     Weight today: 129.5lbs (57.4kg)

## 2018-02-13 NOTE — Telephone Encounter (Signed)
No 10/10 los.

## 2018-02-13 NOTE — Progress Notes (Signed)
[facility] OFFICE PROGRESS NOTE   Diagnosis: Non-small cell lung cancer  INTERVAL HISTORY:   Ms. Beighley returns as scheduled.  She completed another cycle of Pembrolizumab 01/21/2018.  She denies nausea/vomiting.  No diarrhea.  She has a healing sore at the right buccal mucosa which she relates to minor trauma.  No rash.  She has stable dyspnea on exertion.  No fever.  No significant cough.  She had some wheezing last week, none this week.  She thinks she has developed a "toenail fungus".  Objective:  Vital signs in last 24 hours:  Blood pressure 128/78, pulse 79, temperature 97.8 F (36.6 C), temperature source Oral, resp. rate 18, height 5' 3.25" (1.607 m), weight 126 lb 9.6 oz (57.4 kg), SpO2 98 %.    HEENT: No thrush or ulcers. Resp: Distant breath sounds.  No respiratory distress.  No wheezing. Cardio: Regular rate and rhythm. GI: Abdomen soft and nontender.  No hepatomegaly. Vascular: No leg edema. Neuro: Alert and oriented. Skin: No rash.  2 toenails on the right foot with yellow discoloration.   Lab Results:  Lab Results  Component Value Date   WBC 7.2 02/13/2018   HGB 12.9 02/13/2018   HCT 40.7 02/13/2018   MCV 85.3 02/13/2018   PLT 253 02/13/2018   NEUTROABS 5.2 02/13/2018    Imaging:  No results found.  Medications: I have reviewed the patient's current medications.  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  PDL1 60%  Left lung radiation 11/08/2016 through 11/21/2016  CT chest 12/12/2016-reexpansion of left upper  lobe with a decreased left upper lobe mass, unchanged mediastinal adenopathy, progression of a splenic metastasis/pancreatic tail/gastrohepatic ligament metastasis, right lower lobe pneumonia  Cycle 1 Pembrolizumab 12/14/2016  Cycle 2 Pembrolizumab 01/03/2017  Cycle 3 Pembrolizumab 01/24/2017  Cycle 4 Pembrolizumab 02/12/2017  CT 03/05/2018-decrease in left upper lobe mass, mediastinal adenopathy, and splenic mass  Cycle 5 pembrolizumab 03/07/2017  Cycle 6 Pembrolizumab 04/02/2017  Cycle 7 pembrolizumab 04/25/2017  Cycle 8 Pembrolizumab 05/14/2017  Cycle 9 Pembrolizumab 06/06/2017  CT 06/20/2017-mild decrease in left upper lobe mass, mild increase in paramediastinal left upper lobe nodule-radiation change?, decreased splenic metastasis  Cycle 10 pembrolizumab 06/25/2017  Cycle 11 pembrolizumab 07/18/2017  Cycle 12 pembrolizumab 08/29/2017  Cycle 13 pembrolizumab 09/17/2017  Cycle 14 Pembrolizumab 10/10/2017  CT chest 10/24/2017-slight enlargement of small mediastinal lymph nodes, increased left upper lobe consolidation, decreased size of spleen lesion  Cycle 15 pembrolizumab 10/29/2017  Cycle 16 Pembrolizumab 11/21/2017  Cycle 17 Pembrolizumab 12/11/2017  Cycle 18 pembrolizumab 01/02/2018  Cycle 19 Pembrolizumab 01/21/2018  Cycle 20 Pembrolizumab 02/13/2018  2. Acute respiratory failure secondary to #1  3. Oxygen dependent COPD  4. History of a NSTEMI 2015  5. Right lung pneumonia on chest CT 12/12/2016-treated with Levaquin  6.Grade 2 rash possibly related to Pembrolizumab. Treatment held 08/06/2017.Improved 08/29/2017, treatment resumed, rash has resolved  Disposition: Ms. Dern appears stable.  She has completed 19 cycles of Pembrolizumab.  There is no clinical evidence of disease progression.  Plan to proceed with cycle 20 today as scheduled.  She will undergo a restaging chest CT prior to her next visit.  She  may have a fungal infection involving several  toenails.  She will make an appointment with podiatry.  She will return for lab, follow-up and Pembrolizumab on 03/04/2018.  She will contact the office in the interim with any problems.    [name] ANP/GNP-BC   02/13/2018  9:53 AM

## 2018-02-14 ENCOUNTER — Ambulatory Visit
Admission: RE | Admit: 2018-02-14 | Discharge: 2018-02-14 | Disposition: A | Discharge status: Home / Self Care | Payer: Medicare Other | Source: Ambulatory Visit | Attending: Family Medicine | Admitting: Family Medicine

## 2018-02-14 DIAGNOSIS — Z1231 Encounter for screening mammogram for malignant neoplasm of breast: Secondary | ICD-10-CM

## 2018-02-27 ENCOUNTER — Ambulatory Visit (HOSPITAL_COMMUNITY)
Admission: RE | Admit: 2018-02-27 | Discharge: 2018-02-27 | Disposition: A | Discharge status: Home / Self Care | Payer: Medicare Other | Source: Ambulatory Visit | Attending: Nurse Practitioner | Admitting: Nurse Practitioner

## 2018-02-27 DIAGNOSIS — C3412 Malignant neoplasm of upper lobe, left bronchus or lung: Secondary | ICD-10-CM

## 2018-02-27 DIAGNOSIS — D739 Disease of spleen, unspecified: Secondary | ICD-10-CM | POA: Insufficient documentation

## 2018-02-27 DIAGNOSIS — I700 Atherosclerosis of aorta: Secondary | ICD-10-CM | POA: Insufficient documentation

## 2018-02-27 DIAGNOSIS — R590 Localized enlarged lymph nodes: Secondary | ICD-10-CM | POA: Diagnosis not present

## 2018-02-27 DIAGNOSIS — J439 Emphysema, unspecified: Secondary | ICD-10-CM | POA: Diagnosis not present

## 2018-02-27 MED ORDER — IOHEXOL 300 MG/ML  SOLN
75.0000 mL | Freq: Once | INTRAMUSCULAR | Status: AC | PRN
  Administered 2018-02-27: 75 mL via INTRAVENOUS

## 2018-02-28 ENCOUNTER — Ambulatory Visit (INDEPENDENT_AMBULATORY_CARE_PROVIDER_SITE_OTHER): Payer: Medicare Other | Admitting: Podiatry

## 2018-02-28 VITALS — BP 133/91 | HR 78 | Resp 15 | Ht >= 80 in | Wt 126.0 lb

## 2018-02-28 DIAGNOSIS — B351 Tinea unguium: Secondary | ICD-10-CM

## 2018-03-02 NOTE — Progress Notes (Signed)
Subjective:   Patient ID: Brooke [name], female   DOB: 72 y.o.   MRN: [id number]   HPI 72 year old female presents the office today for concerns of toenail thickening, discoloration which is been ongoing for about 2 months.  She states is most in her right first, third digit toenails.  She states that she tried over-the-counter Fungi-Nail which did make the symptoms worse.  Denies any pain at this time but she states that occasion become uncomfortable.  Denies any redness or drainage or any swelling.  She has no other concerns today.   Review of Systems  All other systems reviewed and are negative.  Past Medical History:  Diagnosis Date  . Chronic respiratory failure (HCC)    a. on home O2.  Marland Kitchen COPD (chronic obstructive pulmonary disease) ([place])    a. Home O2.  . Former tobacco use   . GERD (gastroesophageal reflux disease)   . Hyperlipidemia   . NSTEMI (non-ST elevated myocardial infarction) ([place])    a. 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 Coronary vasospasm    Past Surgical History:  Procedure Laterality Date  . LEFT HEART CATHETERIZATION WITH CORONARY ANGIOGRAM N/A 06/08/2013   Procedure: LEFT HEART CATHETERIZATION WITH CORONARY ANGIOGRAM;  Surgeon: Sanda Klein, MD;  Location: [facility] CATH LAB;  Service: Cardiovascular;  Laterality: N/A;     Current Outpatient Medications:  .  acetaminophen (TYLENOL) 325 MG tablet, Per bottle as needed, Disp: , Rfl:  .  albuterol (PROVENTIL HFA;VENTOLIN HFA) 108 (90 BASE) MCG/ACT inhaler, Inhale 2 puffs into the lungs every 4 (four) hours as needed for wheezing or shortness of breath. **PLAN B**, Disp: , Rfl:  .  ALPRAZolam (XANAX) 0.25 MG tablet, 1/2-1 twice daily as needed, Disp: , Rfl: 1 .  atorvastatin (LIPITOR) 20 MG tablet, Take 1 tablet (20 mg total) by mouth daily., Disp: 90 tablet, Rfl: 3 .  atorvastatin (LIPITOR) 20 MG tablet, TAKE 1 TABLET(20 MG) BY MOUTH DAILY, Disp:  90 tablet, Rfl: 0 .  budesonide-formoterol (SYMBICORT) 160-4.5 MCG/ACT inhaler, INHALE 2 PUFFS INTO THE LUNGS TWICE DAILY, Disp: , Rfl:  .  guaiFENesin (MUCINEX) 600 MG 12 hr tablet, Take 600 mg by mouth 2 (two) times daily as needed for cough or to loosen phlegm. , Disp: , Rfl:  .  ibuprofen (ADVIL,MOTRIN) 600 MG tablet, Take 600 mg by mouth every 6 (six) hours as needed., Disp: , Rfl:  .  ipratropium-albuterol (DUONEB) 0.5-2.5 (3) MG/3ML SOLN, Take 3 mLs by nebulization every 4 (four) hours as needed (wheezing/shortness of breath). **PLAN C**, Disp: , Rfl:  .  nitroGLYCERIN (NITROSTAT) 0.4 MG SL tablet, ONE TABLET UNDER TONGUE AS NEEDED FOR CHEST PAIN EVERY 5 MINUTES FOR 3 DOSES, Disp: 25 tablet, Rfl: 3 .  NON FORMULARY, [facility]  Onychomycosis Nail Lacquer -  Fluconazole 2%, Terbinafine 1% DMSO/undecylenic acid 25% Apply to affected nail once daily Qty. 120 gm 3 refills, Disp: , Rfl:  .  OXYGEN, 2lpm 24/7, Disp: , Rfl:  .  Simethicone (GAS RELIEF) 180 MG CAPS, Use as needed, Disp: , Rfl:   Allergies  Allergen Reactions  . Codeine Nausea And Vomiting  . Imdur [Isosorbide Dinitrate]     headache  . Prednisone Other (See Comments)    Reaction:Abnormal behavior; cannot take in pill form but CAN tolerate the injection  . Pulmicort [Budesonide]     "feeling of torture"         Objective:  Physical Exam  General: AAO x3, NAD  Dermatological: The right hallux and third digit toenails hypertrophic, dystrophic with yellow-brown discoloration and mild white discoloration as well.  Nails are mildly loose distally but firmly adhered proximally.  There is no surrounding redness or drainage or any signs of infection noted.  No open lesions.  Vascular: Dorsalis Pedis artery and Posterior Tibial artery pedal pulses are 2/4 bilateral with immedate capillary fill time.  There is no pain with calf compression, swelling, warmth, erythema.   Neruologic: Grossly intact via light touch bilateral.  . Protective threshold with Semmes Wienstein monofilament intact to all pedal sites bilateral.   Musculoskeletal: No gross boney pedal deformities bilateral. No pain, crepitus, or limitation noted with foot and ankle range of motion bilateral. Muscular strength 5/5 in all groups tested bilateral.  Gait: Unassisted, Nonantalgic.  Assessment:   72 year old female with onychodystrophy, likely onychomycosis     Plan:  -Treatment options discussed including all alternatives, risks, and complications -Etiology of symptoms were discussed -Discussed treatment options for nail fungus.  After discussion and given her medical conditions discussed topical treatment.  Ordered a compound ointment through [employer].  Discussed application instructions as well as side effects and success rates.  Trula Slade DPM

## 2018-03-04 ENCOUNTER — Inpatient Hospital Stay (HOSPITAL_BASED_OUTPATIENT_CLINIC_OR_DEPARTMENT_OTHER): Payer: Medicare Other | Admitting: Oncology

## 2018-03-04 ENCOUNTER — Inpatient Hospital Stay: Payer: Medicare Other

## 2018-03-04 VITALS — BP 124/71 | HR 80 | Temp 98.3°F | Resp 18 | Ht >= 80 in | Wt 128.0 lb

## 2018-03-04 DIAGNOSIS — J440 Chronic obstructive pulmonary disease with acute lower respiratory infection: Secondary | ICD-10-CM

## 2018-03-04 DIAGNOSIS — J9600 Acute respiratory failure, unspecified whether with hypoxia or hypercapnia: Secondary | ICD-10-CM

## 2018-03-04 DIAGNOSIS — C3412 Malignant neoplasm of upper lobe, left bronchus or lung: Secondary | ICD-10-CM

## 2018-03-04 DIAGNOSIS — C7889 Secondary malignant neoplasm of other digestive organs: Secondary | ICD-10-CM

## 2018-03-04 DIAGNOSIS — Z5112 Encounter for antineoplastic immunotherapy: Secondary | ICD-10-CM | POA: Diagnosis not present

## 2018-03-04 DIAGNOSIS — Z9981 Dependence on supplemental oxygen: Secondary | ICD-10-CM

## 2018-03-04 DIAGNOSIS — I252 Old myocardial infarction: Secondary | ICD-10-CM

## 2018-03-04 LAB — CBC WITH DIFFERENTIAL (CANCER CENTER ONLY)
ABS IMMATURE GRANULOCYTES: 0.01 10*3/uL (ref 0.00–0.07)
Basophils Absolute: 0 10*3/uL (ref 0.0–0.1)
Basophils Relative: 1 %
Eosinophils Absolute: 0.2 10*3/uL (ref 0.0–0.5)
Eosinophils Relative: 4 %
HCT: 40.3 % (ref 36.0–46.0)
HEMOGLOBIN: 12.7 g/dL (ref 12.0–15.0)
IMMATURE GRANULOCYTES: 0 %
LYMPHS PCT: 18 %
Lymphs Abs: 1.1 10*3/uL (ref 0.7–4.0)
MCH: 26.5 pg (ref 26.0–34.0)
MCHC: 31.5 g/dL (ref 30.0–36.0)
MCV: 84.1 fL (ref 80.0–100.0)
MONO ABS: 0.7 10*3/uL (ref 0.1–1.0)
MONOS PCT: 11 %
NEUTROS ABS: 4.2 10*3/uL (ref 1.7–7.7)
NEUTROS PCT: 66 %
Platelet Count: 254 10*3/uL (ref 150–400)
RBC: 4.79 MIL/uL (ref 3.87–5.11)
RDW: 14.4 % (ref 11.5–15.5)
WBC: 6.3 10*3/uL (ref 4.0–10.5)
nRBC: 0 % (ref 0.0–0.2)

## 2018-03-04 LAB — CMP (CANCER CENTER ONLY)
ALBUMIN: 3.2 g/dL — AB (ref 3.5–5.0)
ALT: 15 U/L (ref 0–44)
AST: 15 U/L (ref 15–41)
Alkaline Phosphatase: 118 U/L (ref 38–126)
Anion gap: 8 (ref 5–15)
BUN: 18 mg/dL (ref 8–23)
CHLORIDE: 106 mmol/L (ref 98–111)
CO2: 25 mmol/L (ref 22–32)
CREATININE: 0.68 mg/dL (ref 0.44–1.00)
Calcium: 9.4 mg/dL (ref 8.9–10.3)
GFR, Est AFR Am: >60 mL/min (ref >60)
GLUCOSE: 95 mg/dL (ref 70–99)
POTASSIUM: 4.5 mmol/L (ref 3.5–5.1)
Sodium: 139 mmol/L (ref 135–145)
Total Bilirubin: 0.3 mg/dL (ref 0.3–1.2)
Total Protein: 7.2 g/dL (ref 6.5–8.1)

## 2018-03-04 LAB — TSH: TSH: 0.897 u[IU]/mL (ref 0.308–3.960)

## 2018-03-04 MED ORDER — SODIUM CHLORIDE 0.9 % IV SOLN
Freq: Once | INTRAVENOUS | Status: AC
  Administered 2018-03-04: 13:00:00 via INTRAVENOUS
  Filled 2018-03-04: qty 250

## 2018-03-04 MED ORDER — SODIUM CHLORIDE 0.9 % IV SOLN
200.0000 mg | Freq: Once | INTRAVENOUS | Status: AC
  Administered 2018-03-04: 200 mg via INTRAVENOUS
  Filled 2018-03-04: qty 8

## 2018-03-04 NOTE — Addendum Note (Signed)
Addended by: Betsy Coder B on: 03/04/2018 11:55 AM   Modules accepted: Orders

## 2018-03-04 NOTE — Progress Notes (Signed)
[facility] OFFICE PROGRESS NOTE   Diagnosis: Non-small cell lung cancer  INTERVAL HISTORY:   Ms. Murdy returns as scheduled.  She continues every 3-week pembrolizumab.  She has been diagnosed with toenail fungus.  No other complaint.  No rash or diarrhea.  Objective:  Vital signs in last 24 hours:  Blood pressure 124/71, pulse 80, temperature 98.3 F (36.8 C), temperature source Oral, resp. rate 18, height 6' 11.2" (2.113 m), weight 128 lb (58.1 kg), SpO2 97 %.    HEENT: No thrush or ulcers Resp: Distant breath sounds, clear bilaterally, no respiratory distress Cardio: Regular rate and rhythm GI: Nontender, no hepatosplenomegaly Vascular: No leg edema  Skin: No rash   Lab Results:  Lab Results  Component Value Date   WBC 6.3 03/04/2018   HGB 12.7 03/04/2018   HCT 40.3 03/04/2018   MCV 84.1 03/04/2018   PLT 254 03/04/2018   NEUTROABS 4.2 03/04/2018    CMP  Lab Results  Component Value Date   NA 139 03/04/2018   K 4.5 03/04/2018   CL 106 03/04/2018   CO2 25 03/04/2018   GLUCOSE 95 03/04/2018   BUN 18 03/04/2018   CREATININE 0.68 03/04/2018   CALCIUM 9.4 03/04/2018   PROT 7.2 03/04/2018   ALBUMIN 3.2 (L) 03/04/2018   AST 15 03/04/2018   ALT 15 03/04/2018   ALKPHOS 118 03/04/2018   BILITOT 0.3 03/04/2018   GFRNONAA >60 03/04/2018   GFRAA >60 03/04/2018     Medications: I have reviewed the patient's current medications.   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  PDL1 60%  Left lung radiation 11/08/2016 through  11/21/2016  CT chest 12/12/2016-reexpansion of left upper lobe with a decreased left upper lobe mass, unchanged mediastinal adenopathy, progression of a splenic metastasis/pancreatic tail/gastrohepatic ligament metastasis, right lower lobe pneumonia  Cycle 1 Pembrolizumab 12/14/2016  Cycle 2 Pembrolizumab 01/03/2017  Cycle 3 Pembrolizumab 01/24/2017  Cycle 4 Pembrolizumab 02/12/2017  CT 03/05/2018-decrease in left upper lobe mass, mediastinal adenopathy, and splenic mass  Cycle 5 pembrolizumab 03/07/2017  Cycle 6 Pembrolizumab 04/02/2017  Cycle 7 pembrolizumab 04/25/2017  Cycle 8 Pembrolizumab 05/14/2017  Cycle 9 Pembrolizumab 06/06/2017  CT 06/20/2017-mild decrease in left upper lobe mass, mild increase in paramediastinal left upper lobe nodule-radiation change?, decreased splenic metastasis  Cycle 10 pembrolizumab 06/25/2017  Cycle 11 pembrolizumab 07/18/2017  Cycle 12 pembrolizumab 08/29/2017  Cycle 13 pembrolizumab 09/17/2017  Cycle 14 Pembrolizumab 10/10/2017  CT chest 10/24/2017-slight enlargement of small mediastinal lymph nodes, increased left upper lobe consolidation, decreased size of spleen lesion  Cycle 15 pembrolizumab 10/29/2017  Cycle 16 Pembrolizumab 11/21/2017  Cycle 17 Pembrolizumab 12/11/2017  Cycle 18 pembrolizumab 01/02/2018  Cycle 19 Pembrolizumab 01/21/2018  Cycle 20 Pembrolizumab 02/13/2018  CT chest 02/27/2018- decreased left paratracheal node, progressive consolidation/fibrosis in the left upper lung, decreased size of splenic mass  Cycle 21 pembrolizumab 03/04/2018  2. Acute respiratory failure secondary to #1  3. Oxygen dependent COPD  4. History of a NSTEMI 2015  5. Right lung pneumonia on chest CT 12/12/2016-treated with Levaquin  6.Grade 2 rash possibly related to Pembrolizumab. Treatment held 08/06/2017.Improved 08/29/2017, treatment resumed, rash has resolved  Disposition: Ms. Uplinger appears stable.  The restaging CT  reveals no evidence of disease progression.  She will continue every 3-week pembrolizumab.  She will return for an office visit in 3 weeks. I reviewed the CT images.  We will follow-up on the TSH from today.  Ms. Boyett declines an influenza vaccine.  She plans to discuss this with Dr. Melvyn Novas when she sees him next week.  She says that she has vomited a few hours after a previous influenza vaccine.  25 minutes were spent with the patient today.  The majority of the time was used for counseling and coordination of care.  Betsy Coder, MD  03/04/2018  10:34 AM

## 2018-03-04 NOTE — Patient Instructions (Signed)
[facility] Discharge Instructions for Patients Receiving Chemotherapy  Today you received the following chemotherapy agents:  Keytruda.  To help prevent nausea and vomiting after your treatment, we encourage you to take your nausea medication as directed.   If you develop nausea and vomiting that is not controlled by your nausea medication, call the clinic.   BELOW ARE SYMPTOMS THAT SHOULD BE REPORTED IMMEDIATELY:  *FEVER GREATER THAN 100.5 F  *CHILLS WITH OR WITHOUT FEVER  NAUSEA AND VOMITING THAT IS NOT CONTROLLED WITH YOUR NAUSEA MEDICATION  *UNUSUAL SHORTNESS OF BREATH  *UNUSUAL BRUISING OR BLEEDING  TENDERNESS IN MOUTH AND THROAT WITH OR WITHOUT PRESENCE OF ULCERS  *URINARY PROBLEMS  *BOWEL PROBLEMS  UNUSUAL RASH Items with * indicate a potential emergency and should be followed up as soon as possible.  Feel free to call the clinic should you have any questions or concerns. The clinic phone number is [phone].  Please show the CHEMO ALERT CARD at check-in to the Emergency Department and triage nurse.    

## 2018-03-10 ENCOUNTER — Telehealth: Payer: Self-pay | Admitting: Podiatry

## 2018-03-10 NOTE — Telephone Encounter (Signed)
My toe is red and swollen, even though I've been using the medicine. I wondered what I should do.

## 2018-03-10 NOTE — Telephone Encounter (Signed)
Pt states she has been using the polish from [place] and toe is red. Pt states she was kind of just slopping the polish on and getting it on the skin. I told pt to stop the polish until the redness subsided, then may apply vaseline around the nail and cover the toenail only with the polish. Pt then states only one of the toes she is applying the polish is red and swollen and tender and she did not apply the polish any different. I told pt I would transfer to schedulers she may have something else going on.

## 2018-03-13 ENCOUNTER — Encounter: Payer: Self-pay | Admitting: Podiatry

## 2018-03-13 ENCOUNTER — Telehealth: Payer: Self-pay | Admitting: Podiatry

## 2018-03-13 ENCOUNTER — Ambulatory Visit (INDEPENDENT_AMBULATORY_CARE_PROVIDER_SITE_OTHER): Payer: Medicare Other | Admitting: Podiatry

## 2018-03-13 VITALS — Ht 63.0 in | Wt 128.0 lb

## 2018-03-13 DIAGNOSIS — L539 Erythematous condition, unspecified: Secondary | ICD-10-CM | POA: Diagnosis not present

## 2018-03-13 DIAGNOSIS — B351 Tinea unguium: Secondary | ICD-10-CM | POA: Diagnosis not present

## 2018-03-13 MED ORDER — AMOXICILLIN-POT CLAVULANATE 875-125 MG PO TABS: 1.0000 | ORAL_TABLET | Freq: Two times a day (BID) | ORAL | 0 refills | Status: DC

## 2018-03-13 NOTE — Telephone Encounter (Addendum)
Pt states she her chemo doctor is Dr. Betsy Coder [phone] and she has liver, lung and spleen cancer.

## 2018-03-13 NOTE — Progress Notes (Signed)
Subjective: 72 year old female presents the office today for concerns of her right big toenail becoming somewhat red.  She states that when she got the compound medicine she states that she started to put her all over her skin and afterwards started become red.  Denies any significant pain to the area denies any drainage or pus or any red streaks. Denies any systemic complaints such as fevers, chills, nausea, vomiting. No acute changes since last appointment, and no other complaints at this time.   Objective: AAO x3, NAD DP/PT pulses palpable bilaterally, CRT less than 3 seconds There is mild tenderness palpation of the base of the right hallux toenail and there is erythema extending along the proximal nail border to the IPJ.  There is no ascending cellulitis there is no drainage or pus identified.  No open lesions or pre-ulcerative lesions.  No pain with calf compression, swelling, warmth, erythema  Assessment: Erythema right hallux  Plan: -All treatment options discussed with the patient including all alternatives, risks, complications.  -Given erythema start antibiotics.  Prescribed Augmentin for her.  Epson salt soaks amount of hold off on compound ointment for now.  She is likely having a reaction to the compound she is been putting on the skin and not the nail.  We may have to remove the toenail but hopefully we can avoid this. -Monitor for any clinical signs or symptoms of infection and directed to call the office immediately should any occur or go to the ER. -Patient encouraged to call the office with any questions, concerns, change in symptoms.    Trula Slade DPM

## 2018-03-13 NOTE — Telephone Encounter (Signed)
Can you ask her if she took the amoxicillin while she was going through chemo? If so we can order that instead if she feels more comfortable.

## 2018-03-13 NOTE — Patient Instructions (Signed)

## 2018-03-13 NOTE — Telephone Encounter (Signed)
After reading the side effects of the amoxicillin, I'm concerned before I take it. Some of the signs, I already have. In general, I'm being treated for liver cancer, lung cancer, and cancer of the spleen. I'm just concerned with how this will interact with the immunotherapy I'm getting for my cancers. My number is [phone].

## 2018-03-14 ENCOUNTER — Encounter: Payer: Self-pay | Admitting: Internal Medicine

## 2018-03-14 ENCOUNTER — Ambulatory Visit (INDEPENDENT_AMBULATORY_CARE_PROVIDER_SITE_OTHER): Payer: Medicare Other | Admitting: Internal Medicine

## 2018-03-14 ENCOUNTER — Other Ambulatory Visit: Payer: Self-pay | Admitting: Podiatry

## 2018-03-14 DIAGNOSIS — L600 Ingrowing nail: Secondary | ICD-10-CM

## 2018-03-14 DIAGNOSIS — J9611 Chronic respiratory failure with hypoxia: Secondary | ICD-10-CM | POA: Diagnosis not present

## 2018-03-14 DIAGNOSIS — J449 Chronic obstructive pulmonary disease, unspecified: Secondary | ICD-10-CM | POA: Diagnosis not present

## 2018-03-14 DIAGNOSIS — R918 Other nonspecific abnormal finding of lung field: Secondary | ICD-10-CM

## 2018-03-14 MED ORDER — CEPHALEXIN 500 MG PO CAPS: 500.0000 mg | ORAL_CAPSULE | Freq: Two times a day (BID) | ORAL | 0 refills | Status: DC

## 2018-03-14 NOTE — Telephone Encounter (Signed)
Pt asked if cephalexin was the same as keflex. I told pt it was.

## 2018-03-14 NOTE — Progress Notes (Signed)
Subjective:    Patient ID: Brooke [name], female   DOB: [birth year],    MRN: [id number]     Brief patient profile:  46 yowf quit smoking 2009 on 02 noct 2012 maint on symbicort 160 since aorund  2014 referred to pulmonary clinic 02/15/2016 by Dr   Kathryne Eriksson for RUL Lung mass dx with Stage IV nsc 11/01/16 in setting of GOLD IV copd      History of Present Illness  02/15/2016 1st [facility] office visit/ Brooke [name]  GOLD IV copd/ symb 160 2bid maint  Chief Complaint  Patient presents with  . Pulmonary Consult    referred by Dr. Kathryne Eriksson for COPD.  MMRC2 = can't walk a nl pace on a flat grade s sob but does fine slow and flat eg [employer]/ [employer] on 2lpm  New onset bloody mucus plugs November 11 2015 assoc with wt loss  02 2lpm hs and with walking / rare saba needed rec For cough > mucinex up to 1200 mg every 12 hours as needed Rec:  Fob but refused   11/01/16   CT directed bx >>>  Pos NSC LUL    Diagnosis:   Stage IV NSCLC, Squamous cell carcinoma of the left upper lobe.     Indication for treatment:  palliative       Radiation treatment dates:   11/08/2016 to 11/21/2016  Site/dose:   The Left lung was treated to 30 Gy in 10 fractions at 3 Gy per fraction.   Beams/energy:   3D // 10X, 6X    12/06/2016  f/u ov/Brooke [name] re: post hosp f/u -  Now on dulera 200 bid and duoneb Chief Complaint  Patient presents with  . HFU    Breathing is doing well. She is using Duoneb 2 x daily and uses albuterol inhaler 1-2 x per wk on average.   presently in SNF ever since last admit 6/18-29/18 for  Active Problems:   COPD  GOLD IV    Mass of upper lobe of left lung   Chronic respiratory failure with hypoxia (HCC)   COPD exacerbation (HCC)   Hypokalemia   Microcytic anemia   SOB (shortness of breath)   Protein-calorie malnutrition, severe Doe = 2lpm x 145ft with rolling walking  Sleeping ok at < 30 degrees Doe = 2lpm x 183ft with rolling walker/2lpm Sleeping ok at < 30 degrees   rec 02 can be adjusted down and off for saturations above 90%     12/12/16 rx Levaquin for ? RLL pna by oncology based on CT findings  though no clear clinical infection    01/08/2017  f/u ov/Brooke [name] re:   GOLD IV copd/  [place] [facility] ca s/p 10 RT and on [place]  Chief Complaint  Patient presents with  . Follow-up    Pt states going to be discharged from home on 01/09/17. She states her breathing is doing well and no co's.   doe improving at SNF > doing some outdoors walking pushing w/c on 02(puts it in the w/c)  including some inclines  2lpm exertion and hs but not at rest / still using duoneb bid (not prn) and dulera 200 2bid with poor hfa (see copd ap)  rec Symbicort 160 = Dulera 200 Goal is to keep saturation over 90% at rest and with exertion/ continue 02 2lpm at bedtime Plan A = Automatic =  dulera 200 Take 2 puffs first thing in am and then another 2 puffs about 12 hours  later.  Work on inhaler technique:   Plan B = Backup Only use your albuterol as a rescue medication  Plan C = Crisis - only use your albuterol (duoneb) nebulizer if you first try Plan B and it fails to help > ok to use the nebulizer up to every 4 hours but if start needing it regularly call for immediate appointment     06/10/2017  f/u ov/Brooke [name] re:  GOLD IV copd Chief Complaint  Patient presents with  . Follow-up    Breathing has improved some since her last visit. She is using her neb 2 x daily and she has not used her albuterol inhaler due to cost.  Dyspnea:  MMRC2 = can't walk a nl pace on a flat grade s sob but does fine slow and flat eg shopping leaning on cart on 2lpm  Cough: minimium in am  Sleep: on 2lpm some nasal congestion / humidified / never needs saba at hs but when does use saba always neb now due to cost noted  Having some HB better with prn pepcid  rec Work on inhaler technique:  For acid indigestion > pepcid 20 mg up to 12 hours as needed GERD diet    12/09/2017  f/u ov/Brooke [name] re: GOLD IV copd/  No 02  at rest/ 2lpm sleep and with activity  Chief Complaint  Patient presents with  . Follow-up    Breathing is overall doing well. She has occ cough with white sputum- relates to PND.  She uses her albuterol inhaler 2 x daily on average. She states she uses neb before she knows she is going somewhere.   Dyspnea:  MMRC2 = can't walk a nl pace on a flat grade s sob but does fine slow and flat eg still pushing cart  Cough: min pnd  SABA use: as above  rec Continue  symbicort and add stiolto 2 pffs each am - - if you don't like it for any reason resume the symbicort alone as per your med calendar  Please schedule a follow up visit in 3 months but call sooner if needed  - add:  Pt notified above typo should have said add spiriva x 2 pffs each am and continue symbicort Take 2 puffs first thing in am and then another 2 puffs about 12 hours later.   - 12/12/2017 informed could not tol spiriva due to dry mouth   03/14/2018  f/u ov/Brooke [name] re:  GOLD IV / 02 is 2lpm  Hs and 2lpm exertion/   RA sitting / just on symbicort 160 2bid  Chief Complaint  Patient presents with  . Follow-up    increased cough with white sputum. She states her breathing has been doing well. She is using her albuterol inhaler 2 x per wk and neb maybe 2 x  per wk on average.    Dyspnea:  [employer] ok pushing cart on 2lpm  Cough: more since 2 weeks  Sleeping: flat bed/ 2-3 pillow  SABA use: avg as above 02: as above     No obvious day to day or daytime variability or assoc excess/ purulent sputum or mucus plugs or hemoptysis or cp or chest tightness, subjective wheeze or overt sinus or hb symptoms.   Sleeping as abov3 without nocturnal  or early am exacerbation  of respiratory  c/o's or need for noct saba. Also denies any obvious fluctuation of symptoms with weather or environmental changes or other aggravating or alleviating factors except as outlined above  No unusual exposure hx or h/o childhood pna/ asthma or knowledge of  premature birth.  Current Allergies, Complete Past Medical History, Past Surgical History, Family History, and Social History were reviewed in [employer] record.  ROS  The following are not active complaints unless bolded Hoarseness, sore throat, dysphagia, dental problems, itching, sneezing,  nasal congestion or discharge of excess mucus or purulent secretions, ear ache,   fever, chills, sweats, unintended wt loss or wt gain, classically pleuritic or exertional cp,  orthopnea pnd or arm/hand swelling  or leg swelling, presyncope, palpitations, abdominal pain, anorexia, nausea, vomiting, diarrhea  or change in bowel habits or change in bladder habits, change in stools or change in urine, dysuria, hematuria,  rash, arthralgias, visual complaints, headache, numbness, weakness or ataxia or problems with walking or coordination,  change in mood or  memory.        Current Meds  Medication Sig  . acetaminophen (TYLENOL) 325 MG tablet Per bottle as needed  . albuterol (PROVENTIL HFA;VENTOLIN HFA) 108 (90 BASE) MCG/ACT inhaler Inhale 2 puffs into the lungs every 4 (four) hours as needed for wheezing or shortness of breath. **PLAN B**  . ALPRAZolam (XANAX) 0.25 MG tablet 1/2-1 twice daily as needed  . atorvastatin (LIPITOR) 20 MG tablet Take 1 tablet (20 mg total) by mouth daily.  . budesonide-formoterol (SYMBICORT) 160-4.5 MCG/ACT inhaler INHALE 2 PUFFS INTO THE LUNGS TWICE DAILY  . guaiFENesin (MUCINEX) 600 MG 12 hr tablet Take 600 mg by mouth 2 (two) times daily as needed for cough or to loosen phlegm.   Marland Kitchen ibuprofen (ADVIL,MOTRIN) 600 MG tablet Take 600 mg by mouth every 6 (six) hours as needed.  Marland Kitchen ipratropium-albuterol (DUONEB) 0.5-2.5 (3) MG/3ML SOLN Take 3 mLs by nebulization every 4 (four) hours as needed (wheezing/shortness of breath). **PLAN C**  . nitroGLYCERIN (NITROSTAT) 0.4 MG SL tablet ONE TABLET UNDER TONGUE AS NEEDED FOR CHEST PAIN EVERY 5 MINUTES FOR 3 DOSES  . NON  FORMULARY [facility]  Onychomycosis Nail Lacquer -  Fluconazole 2%, Terbinafine 1% DMSO/undecylenic acid 25% Apply to affected nail once daily Qty. 120 gm 3 refills  . OXYGEN 2lpm 24/7  . Simethicone (GAS RELIEF) 180 MG CAPS Use as needed                  Objective:   Physical Exam  slt hoarse wf nad/ wheeze resolves with plm   03/14/2018       127  12/09/2017         125   06/10/2017         119  01/08/2017         112   12/06/2016        110   02/15/16 119 lb (54 kg)  08/18/15 137 lb 12.8 oz (62.5 kg)  02/17/15 138 lb 1.9 oz (62.7 kg)        Ingrown R toenail    HEENT: nl dentition / oropharynx. Nl external ear canals without cough reflex -  Mild bilateral non-specific turbinate edema     NECK :  without JVD/Nodes/TM/ nl carotid upstrokes bilaterally   LUNGS: no acc muscle use,  Mod barrel  contour chest wall with bilateral  Distant bs s audible wheeze and  without cough on insp or exp maneuver and mod   Hyperresonant  to  percussion bilaterally     CV:  RRR  no s3 or murmur or increase in P2, and no edema   ABD:  soft and nontender with pos mid/late  insp Hoover's  in the supine position. No bruits or organomegaly appreciated, bowel sounds nl  MS:   Nl gait/  ext warm with inflammed swollen R great toe at base of nail/ no pus , calf tenderness, cyanosis or clubbing No obvious joint restrictions   SKIN: warm and dry without lesions    NEURO:  alert, approp, nl sensorium with  no motor or cerebellar deficits apparent.          I personally reviewed images and agree with radiology impression as follows:   Chest CT 02/27/18 w/contrast 1. Progressive changes of masslike architectural distortion with surrounding fibrosis involving the left upper lung. Findings are presumed to represent changes secondary to external beam radiation. Underlying lung primary is no longer visualized. 2. Interval decrease in size of left paratracheal adenopathy. 3. Interval  decrease in size of splenic mass.      Assessment:

## 2018-03-14 NOTE — Progress Notes (Signed)
Keflex sent to the pharmacy. Will not do augmentin.

## 2018-03-14 NOTE — Telephone Encounter (Signed)
Left message informing pt of Dr. Wagoner's orders. 

## 2018-03-14 NOTE — Patient Instructions (Addendum)
Work on inhaler technique:  relax and gently blow all the way out then take a nice smooth deep breath back in, triggering the inhaler at same time you start breathing in.  Hold for up to 5 seconds if you can. Blow out symbicort  thru nose. Rinse and gargle with water when done  [facility] to take augmentin unless it makes you nauseated    Please schedule a follow up visit in 3 months but call sooner if needed

## 2018-03-14 NOTE — Telephone Encounter (Signed)
We will start Keflex. She should be OK with that.

## 2018-03-14 NOTE — Telephone Encounter (Signed)
Pt states she has a question about the message.

## 2018-03-16 ENCOUNTER — Encounter: Payer: Self-pay | Admitting: Internal Medicine

## 2018-03-16 DIAGNOSIS — L600 Ingrowing nail: Secondary | ICD-10-CM | POA: Insufficient documentation

## 2018-03-16 NOTE — Assessment & Plan Note (Signed)
rx 2lpm hs and prn daytime since 2014    >>>>  rec as of 03/14/2018  = 2lpm hs and prn with activity.  Advised goal is to keep sats > 90% at all times, no need for 02 at rest

## 2018-03-16 NOTE — Assessment & Plan Note (Signed)
Advised on soaking and risk/benefit of augmentin favors going ahead and taking it     I had an extended discussion with the patient reviewing all relevant studies completed to date and  lasting 15 to 20 minutes of a 25 minute visit    See device teaching which extended face to face time for this visit.  Each maintenance medication was reviewed in detail including emphasizing most importantly the difference between maintenance and prns and under what circumstances the prns are to be triggered using an action plan format that is not reflected in the computer generated alphabetically organized AVS which I have not found useful in most complex patients, especially with respiratory illnesses  Please see AVS for specific instructions unique to this visit that I personally wrote and verbalized to the the pt in detail and then reviewed with pt  by my nurse highlighting any  changes in therapy recommended at today's visit to their plan of care.

## 2018-03-16 NOTE — Assessment & Plan Note (Signed)
Spirometry 02/15/2016  FEV1 0.60 (28%)  Ratio 38 with classic curvature  p am symbicort 160  - 01/08/2017    continue dulera 200 2bid  - 12/09/2017  After extensive coaching inhaler device  effectiveness =    90% with smi >add spiriva to  symbicort  - 12/12/2017 informed could not tol spiriva due to dry mouth   - 03/14/2018  After extensive coaching inhaler device,  effectiveness =    75% (short Ti)  Adequate control on present rx, reviewed in detail with pt > no change in rx needed  At this point but needs to perfect symbicort /hfa technique since can't tol lama

## 2018-03-16 NOTE — Assessment & Plan Note (Signed)
See CT Chest 01/26/16 c/w ? Stage  lung ca arising in LUL - PET 02/28/16 1. Large lobular hypermetabolic mass in the LEFT upper lobe abutting the oblique fissure consists with primary bronchogenic carcinoma. 2. Evidence of ipsilateral hypermetabolic mediastinal nodal Metastasis only > 02/29/2016  referred for EBUS/ Dr [place] Bells > declined  -11/01/16   CT directed bx  Pos [place] LUL  > RT palliative completed 11/21/16 / rx per oncology = Keytruda  - CT 02/27/18 fibrosis of LUL / nodes smaller  Advised with nodes getting smaller likely the changes in LUL are residual scarring and not a recurrent mass.

## 2018-03-17 ENCOUNTER — Telehealth: Payer: Self-pay | Admitting: Podiatry

## 2018-03-17 ENCOUNTER — Telehealth: Payer: Self-pay | Admitting: *Deleted

## 2018-03-17 NOTE — Telephone Encounter (Signed)
Pt states she pressed pus from the cuticle of the toe during the soaking, is taking the antibiotic and using the antibiotic ointment after the soaks, has no increased redness or swelling, no streaks or increase pain. Pt states she has an appt on Friday.

## 2018-03-17 NOTE — Telephone Encounter (Signed)
Patient is being treated for her toe and is on antibiotic. When patient soaked her foot yesterday, puss came out of the toe

## 2018-03-17 NOTE — Telephone Encounter (Signed)
Message sent to Dr. Jacqualyn Posey in another Telephone Call 03/17/2018.

## 2018-03-17 NOTE — Telephone Encounter (Signed)
She can come in before. Likely needs to have the nail removed.

## 2018-03-17 NOTE — Telephone Encounter (Signed)
She did not come in today that I saw.

## 2018-03-18 NOTE — Telephone Encounter (Signed)
I called pt and she states she is soaking and it doesn't look as red or swollen and she will she if she can mash any pus out after the soak. I told pt to only pat the area dry, mashing continues to keep the area irritated and the body continues to send fluid and white and red cells to the area. I told pt it was best to allow the soaks to keep the area so that it would heal from the inside out and use the oral and topical antibiotic for the infection, call if anything changed otherwise we would see her on Friday. Pt states understanding.

## 2018-03-21 ENCOUNTER — Ambulatory Visit (INDEPENDENT_AMBULATORY_CARE_PROVIDER_SITE_OTHER): Payer: Medicare Other | Admitting: Podiatry

## 2018-03-21 ENCOUNTER — Other Ambulatory Visit: Payer: Self-pay | Admitting: Physician Assistant

## 2018-03-21 DIAGNOSIS — L539 Erythematous condition, unspecified: Secondary | ICD-10-CM

## 2018-03-25 NOTE — Progress Notes (Signed)
Subjective: 72 year old female presents the office today for follow-up evaluation of redness to the right toe area.  She called earlier this week since she has some pus out of the nail on the base however that was a one-time thing she has not been able to get any more out.  She states that she looks of it every day.  Still has redness at the base of the toenail but denies any red streaks.  No pain to the nail she reports.  She does not want to have the nail removed.  She has been soaking in Epsom salts and she finished antibiotics.  Denies any systemic complaints such as fevers, chills, nausea, vomiting. No acute changes since last appointment, and no other complaints at this time.   Objective: AAO x3, NAD DP/PT pulses palpable bilaterally, CRT less than 3 seconds There is still erythema at the base of the toenail however it appears to be somewhat darker in color and starts to improve.  There is no drainage or pus unable to elicit today and I am not able to elicit any discomfort upon palpation of the nail.  There is not any fluid in the toenail the nail is adhered.  No fluctuation or crepitation is no ascending cellulitis. No open lesions or pre-ulcerative lesions.  No pain with calf compression, swelling, warmth, erythema  Assessment: Right hallux erythema  Plan: -All treatment options discussed with the patient including all alternatives, risks, complications.  -Overall the redness may be somewhat better.  Continue Epson salt soaks, antibiotic ointment and a bandage.  Not able to get any purulence out of the nail and is still discussed the total nail avulsion but she wants to hold off on this.  We will keep a close eye on the toenail but if symptoms worsen she will need to have this done and she understands.  Monitoring signs or symptoms of worsening erythema or infections let me know immediately should any occur go to the emergency room. -Patient encouraged to call the office with any questions,  concerns, change in symptoms.   Trula Slade DPM

## 2018-03-27 ENCOUNTER — Telehealth: Payer: Self-pay

## 2018-03-27 ENCOUNTER — Inpatient Hospital Stay: Payer: Medicare Other | Attending: Oncology | Admitting: Nurse Practitioner

## 2018-03-27 ENCOUNTER — Encounter: Payer: Self-pay | Admitting: Nurse Practitioner

## 2018-03-27 ENCOUNTER — Other Ambulatory Visit: Payer: Self-pay | Admitting: Emergency Medicine

## 2018-03-27 ENCOUNTER — Inpatient Hospital Stay: Payer: Medicare Other

## 2018-03-27 VITALS — BP 130/68 | HR 77 | Temp 98.0°F | Resp 17 | Ht 63.0 in | Wt 126.8 lb

## 2018-03-27 DIAGNOSIS — Z9981 Dependence on supplemental oxygen: Secondary | ICD-10-CM | POA: Diagnosis not present

## 2018-03-27 DIAGNOSIS — C3412 Malignant neoplasm of upper lobe, left bronchus or lung: Secondary | ICD-10-CM | POA: Diagnosis not present

## 2018-03-27 DIAGNOSIS — K5900 Constipation, unspecified: Secondary | ICD-10-CM | POA: Insufficient documentation

## 2018-03-27 DIAGNOSIS — I252 Old myocardial infarction: Secondary | ICD-10-CM | POA: Insufficient documentation

## 2018-03-27 DIAGNOSIS — Z5112 Encounter for antineoplastic immunotherapy: Secondary | ICD-10-CM | POA: Insufficient documentation

## 2018-03-27 DIAGNOSIS — J9600 Acute respiratory failure, unspecified whether with hypoxia or hypercapnia: Secondary | ICD-10-CM

## 2018-03-27 DIAGNOSIS — C7889 Secondary malignant neoplasm of other digestive organs: Secondary | ICD-10-CM

## 2018-03-27 DIAGNOSIS — J440 Chronic obstructive pulmonary disease with acute lower respiratory infection: Secondary | ICD-10-CM | POA: Insufficient documentation

## 2018-03-27 LAB — CMP (CANCER CENTER ONLY)
ALBUMIN: 3.5 g/dL (ref 3.5–5.0)
ALT: 14 U/L (ref 0–44)
ANION GAP: 9 (ref 5–15)
AST: 16 U/L (ref 15–41)
Alkaline Phosphatase: 119 U/L (ref 38–126)
BUN: 15 mg/dL (ref 8–23)
CO2: 25 mmol/L (ref 22–32)
Calcium: 9.7 mg/dL (ref 8.9–10.3)
Chloride: 105 mmol/L (ref 98–111)
Creatinine: 0.71 mg/dL (ref 0.44–1.00)
GLUCOSE: 97 mg/dL (ref 70–99)
POTASSIUM: 4.4 mmol/L (ref 3.5–5.1)
Sodium: 139 mmol/L (ref 135–145)
TOTAL PROTEIN: 7.5 g/dL (ref 6.5–8.1)
Total Bilirubin: 0.3 mg/dL (ref 0.3–1.2)

## 2018-03-27 LAB — CBC WITH DIFFERENTIAL (CANCER CENTER ONLY)
Abs Immature Granulocytes: 0.01 10*3/uL (ref 0.00–0.07)
BASOS PCT: 0 %
Basophils Absolute: 0 10*3/uL (ref 0.0–0.1)
Eosinophils Absolute: 0.2 10*3/uL (ref 0.0–0.5)
Eosinophils Relative: 4 %
HCT: 41 % (ref 36.0–46.0)
Hemoglobin: 13.4 g/dL (ref 12.0–15.0)
IMMATURE GRANULOCYTES: 0 %
Lymphocytes Relative: 17 %
Lymphs Abs: 1 10*3/uL (ref 0.7–4.0)
MCH: 27.3 pg (ref 26.0–34.0)
MCHC: 32.7 g/dL (ref 30.0–36.0)
MCV: 83.5 fL (ref 80.0–100.0)
Monocytes Absolute: 0.6 10*3/uL (ref 0.1–1.0)
Monocytes Relative: 10 %
NEUTROS ABS: 4.4 10*3/uL (ref 1.7–7.7)
NEUTROS PCT: 69 %
NRBC: 0 % (ref 0.0–0.2)
PLATELETS: 239 10*3/uL (ref 150–400)
RBC: 4.91 MIL/uL (ref 3.87–5.11)
RDW: 14.5 % (ref 11.5–15.5)
WBC: 6.3 10*3/uL (ref 4.0–10.5)

## 2018-03-27 MED ORDER — SODIUM CHLORIDE 0.9 % IV SOLN
Freq: Once | INTRAVENOUS | Status: AC
  Administered 2018-03-27: 11:00:00 via INTRAVENOUS
  Filled 2018-03-27: qty 250

## 2018-03-27 MED ORDER — SODIUM CHLORIDE 0.9 % IV SOLN
200.0000 mg | Freq: Once | INTRAVENOUS | Status: AC
  Administered 2018-03-27: 200 mg via INTRAVENOUS
  Filled 2018-03-27: qty 8

## 2018-03-27 NOTE — Addendum Note (Signed)
Addended by: Betsy Coder B on: 03/27/2018 10:40 AM   Modules accepted: Orders

## 2018-03-27 NOTE — Telephone Encounter (Signed)
Printed avs and calender of upcoming appointment. Per 11/21

## 2018-03-27 NOTE — Progress Notes (Signed)
[name]   Diagnosis: Non-small cell lung cancer  INTERVAL HISTORY:   [name] returns as scheduled.  She continues every 3-week Pembrolizumab.  She overall feels well.  No nausea or vomiting.  No mouth sores.  No diarrhea.  She notes recent constipation which she attributes to an antibiotic for a toe infection.  No rash.  No change in baseline dyspnea on exertion.  Objective:  Vital signs in last 24 hours:  Blood pressure 130/68, pulse 77, temperature 98 F (36.7 C), temperature source Oral, resp. rate 17, height '5\' 3"'  (1.6 m), weight 126 lb 12.8 oz (57.5 kg), SpO2 98 %.    HEENT: No thrush or ulcers. Resp: Lungs clear bilaterally. Cardio: Regular rate and rhythm. GI: Abdomen soft and nontender.  No hepatomegaly. Vascular: No leg edema.  Calves soft and nontender.  Skin: No rash.   Lab Results:  Lab Results  Component Value Date   WBC 6.3 03/27/2018   HGB 13.4 03/27/2018   HCT 41.0 03/27/2018   MCV 83.5 03/27/2018   PLT 239 03/27/2018   NEUTROABS 4.4 03/27/2018    Imaging:  No results found.  Medications: I have reviewed the patient's current medications.  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  PDL1 60%  Left lung radiation 11/08/2016 through 11/21/2016  CT chest 12/12/2016-reexpansion of left upper lobe with a decreased left upper lobe mass, unchanged mediastinal adenopathy, progression of a splenic metastasis/pancreatic tail/gastrohepatic ligament metastasis, right lower lobe pneumonia  Cycle  1 Pembrolizumab 12/14/2016  Cycle 2 Pembrolizumab 01/03/2017  Cycle 3 Pembrolizumab 01/24/2017  Cycle 4 Pembrolizumab 02/12/2017  CT 03/05/2018-decrease in left upper lobe mass, mediastinal adenopathy, and splenic mass  Cycle 5 pembrolizumab 03/07/2017  Cycle 6 Pembrolizumab 04/02/2017  Cycle 7 pembrolizumab 04/25/2017  Cycle 8 Pembrolizumab 05/14/2017  Cycle 9 Pembrolizumab 06/06/2017  CT 06/20/2017-mild decrease in left upper lobe mass, mild increase in paramediastinal left upper lobe nodule-radiation change?, decreased splenic metastasis  Cycle 10 pembrolizumab 06/25/2017  Cycle 11 pembrolizumab 07/18/2017  Cycle 12 pembrolizumab 08/29/2017  Cycle 13 pembrolizumab 09/17/2017  Cycle 14 Pembrolizumab 10/10/2017  CT chest 10/24/2017-slight enlargement of small mediastinal lymph nodes, increased left upper lobe consolidation, decreased size of spleen lesion  Cycle 15 pembrolizumab 10/29/2017  Cycle 16 Pembrolizumab 11/21/2017  Cycle 17 Pembrolizumab 12/11/2017  Cycle 18 pembrolizumab 01/02/2018  Cycle 19 Pembrolizumab 01/21/2018  Cycle 20 Pembrolizumab 02/13/2018  CT chest 02/27/2018- decreased left paratracheal node, progressive consolidation/fibrosis in the left upper lung, decreased size of splenic mass  Cycle 21 pembrolizumab 03/04/2018  Cycle 22 Pembrolizumab 03/27/2018  2. Acute respiratory failure secondary to #1  3. Oxygen dependent COPD  4. History of a NSTEMI 2015  5. Right lung pneumonia on chest CT 12/12/2016-treated with Levaquin  6.Grade 2 rash possibly related to Pembrolizumab. Treatment held 08/06/2017.Improved 08/29/2017, treatment resumed, rash has resolved   Disposition: [name] appears stable.  There is no clinical evidence of disease progression.  Plan to proceed with cycle 22 Pembrolizumab today as scheduled.  She will return for lab, follow-up and cycle 23 in 3 weeks.  She will contact the office in the interim with any  problems.    [name] ANP/GNP-BC   03/27/2018  9:44 AM

## 2018-03-27 NOTE — Patient Instructions (Signed)
[facility] Discharge Instructions for Patients Receiving Chemotherapy  Today you received the following chemotherapy agents:  Keytruda.  To help prevent nausea and vomiting after your treatment, we encourage you to take your nausea medication as directed.   If you develop nausea and vomiting that is not controlled by your nausea medication, call the clinic.   BELOW ARE SYMPTOMS THAT SHOULD BE REPORTED IMMEDIATELY:  *FEVER GREATER THAN 100.5 F  *CHILLS WITH OR WITHOUT FEVER  NAUSEA AND VOMITING THAT IS NOT CONTROLLED WITH YOUR NAUSEA MEDICATION  *UNUSUAL SHORTNESS OF BREATH  *UNUSUAL BRUISING OR BLEEDING  TENDERNESS IN MOUTH AND THROAT WITH OR WITHOUT PRESENCE OF ULCERS  *URINARY PROBLEMS  *BOWEL PROBLEMS  UNUSUAL RASH Items with * indicate a potential emergency and should be followed up as soon as possible.  Feel free to call the clinic should you have any questions or concerns. The clinic phone number is [phone].  Please show the CHEMO ALERT CARD at check-in to the Emergency Department and triage nurse.    

## 2018-04-07 ENCOUNTER — Ambulatory Visit: Payer: Medicare Other

## 2018-04-07 DIAGNOSIS — L539 Erythematous condition, unspecified: Secondary | ICD-10-CM

## 2018-04-07 NOTE — Patient Instructions (Signed)

## 2018-04-07 NOTE — Progress Notes (Signed)
Patient is here today for follow-up appointment for right hallux erythema.  She states that she feels like the area is healing over well and the redness has improved along with the pain.  No redness, no erythema, no swelling, no drainage, no's other signs and symptoms of infection.  Advised patient to continue with Epson salts soaks for a few more days then she can discontinue them.  Discussed signs and symptoms of infection.  Patient is to follow-up with any acute symptom changes.

## 2018-04-13 ENCOUNTER — Other Ambulatory Visit: Payer: Self-pay | Admitting: Oncology

## 2018-04-15 ENCOUNTER — Inpatient Hospital Stay: Payer: Medicare Other | Attending: Oncology

## 2018-04-15 ENCOUNTER — Inpatient Hospital Stay (HOSPITAL_BASED_OUTPATIENT_CLINIC_OR_DEPARTMENT_OTHER): Payer: Medicare Other | Admitting: Nurse Practitioner

## 2018-04-15 ENCOUNTER — Encounter: Payer: Self-pay | Admitting: Nurse Practitioner

## 2018-04-15 ENCOUNTER — Inpatient Hospital Stay: Payer: Medicare Other

## 2018-04-15 VITALS — BP 131/79 | HR 85 | Temp 97.1°F | Resp 19 | Ht 63.0 in | Wt 125.7 lb

## 2018-04-15 DIAGNOSIS — Z79899 Other long term (current) drug therapy: Secondary | ICD-10-CM | POA: Diagnosis not present

## 2018-04-15 DIAGNOSIS — J440 Chronic obstructive pulmonary disease with acute lower respiratory infection: Secondary | ICD-10-CM | POA: Insufficient documentation

## 2018-04-15 DIAGNOSIS — C3412 Malignant neoplasm of upper lobe, left bronchus or lung: Secondary | ICD-10-CM | POA: Insufficient documentation

## 2018-04-15 DIAGNOSIS — C7889 Secondary malignant neoplasm of other digestive organs: Secondary | ICD-10-CM

## 2018-04-15 DIAGNOSIS — Z9981 Dependence on supplemental oxygen: Secondary | ICD-10-CM

## 2018-04-15 DIAGNOSIS — Z5112 Encounter for antineoplastic immunotherapy: Secondary | ICD-10-CM | POA: Insufficient documentation

## 2018-04-15 LAB — CBC WITH DIFFERENTIAL (CANCER CENTER ONLY)
ABS IMMATURE GRANULOCYTES: 0.01 10*3/uL (ref 0.00–0.07)
Basophils Absolute: 0 10*3/uL (ref 0.0–0.1)
Basophils Relative: 0 %
Eosinophils Absolute: 0.2 10*3/uL (ref 0.0–0.5)
Eosinophils Relative: 2 %
HCT: 42.7 % (ref 36.0–46.0)
HEMOGLOBIN: 13.2 g/dL (ref 12.0–15.0)
Immature Granulocytes: 0 %
LYMPHS PCT: 12 %
Lymphs Abs: 0.9 10*3/uL (ref 0.7–4.0)
MCH: 26.9 pg (ref 26.0–34.0)
MCHC: 30.9 g/dL (ref 30.0–36.0)
MCV: 87.1 fL (ref 80.0–100.0)
MONO ABS: 0.5 10*3/uL (ref 0.1–1.0)
Monocytes Relative: 6 %
NEUTROS ABS: 6.1 10*3/uL (ref 1.7–7.7)
Neutrophils Relative %: 80 %
Platelet Count: 224 10*3/uL (ref 150–400)
RBC: 4.9 MIL/uL (ref 3.87–5.11)
RDW: 14.6 % (ref 11.5–15.5)
WBC Count: 7.7 10*3/uL (ref 4.0–10.5)
nRBC: 0 % (ref 0.0–0.2)

## 2018-04-15 LAB — CMP (CANCER CENTER ONLY)
ALT: 15 U/L (ref 0–44)
AST: 18 U/L (ref 15–41)
Albumin: 3.6 g/dL (ref 3.5–5.0)
Alkaline Phosphatase: 106 U/L (ref 38–126)
Anion gap: 9 (ref 5–15)
BUN: 10 mg/dL (ref 8–23)
CO2: 26 mmol/L (ref 22–32)
Calcium: 9.6 mg/dL (ref 8.9–10.3)
Chloride: 105 mmol/L (ref 98–111)
Creatinine: 0.65 mg/dL (ref 0.44–1.00)
GFR, Est AFR Am: >60 mL/min (ref >60)
GFR, Estimated: >60 mL/min (ref >60)
Glucose, Bld: 101 mg/dL — ABNORMAL HIGH (ref 70–99)
Potassium: 4.4 mmol/L (ref 3.5–5.1)
Sodium: 140 mmol/L (ref 135–145)
Total Bilirubin: 0.4 mg/dL (ref 0.3–1.2)
Total Protein: 7.3 g/dL (ref 6.5–8.1)

## 2018-04-15 LAB — TSH: TSH: 0.554 u[IU]/mL (ref 0.308–3.960)

## 2018-04-15 MED ORDER — SODIUM CHLORIDE 0.9 % IV SOLN
200.0000 mg | Freq: Once | INTRAVENOUS | Status: AC
  Administered 2018-04-15: 200 mg via INTRAVENOUS
  Filled 2018-04-15: qty 8

## 2018-04-15 MED ORDER — SODIUM CHLORIDE 0.9 % IV SOLN
Freq: Once | INTRAVENOUS | Status: AC
  Administered 2018-04-15: 11:00:00 via INTRAVENOUS
  Filled 2018-04-15: qty 250

## 2018-04-15 NOTE — Patient Instructions (Signed)
[facility] Discharge Instructions for Patients Receiving Chemotherapy  Today you received the following chemotherapy agents:  Keytruda.  To help prevent nausea and vomiting after your treatment, we encourage you to take your nausea medication as directed.   If you develop nausea and vomiting that is not controlled by your nausea medication, call the clinic.   BELOW ARE SYMPTOMS THAT SHOULD BE REPORTED IMMEDIATELY:  *FEVER GREATER THAN 100.5 F  *CHILLS WITH OR WITHOUT FEVER  NAUSEA AND VOMITING THAT IS NOT CONTROLLED WITH YOUR NAUSEA MEDICATION  *UNUSUAL SHORTNESS OF BREATH  *UNUSUAL BRUISING OR BLEEDING  TENDERNESS IN MOUTH AND THROAT WITH OR WITHOUT PRESENCE OF ULCERS  *URINARY PROBLEMS  *BOWEL PROBLEMS  UNUSUAL RASH Items with * indicate a potential emergency and should be followed up as soon as possible.  Feel free to call the clinic should you have any questions or concerns. The clinic phone number is [phone].  Please show the CHEMO ALERT CARD at check-in to the Emergency Department and triage nurse.    

## 2018-04-15 NOTE — Progress Notes (Signed)
[place] OFFICE PROGRESS NOTE   Diagnosis: Non-small cell lung cancer  INTERVAL HISTORY:   Brooke [name] returns as scheduled.  She continues every 3-week pembrolizumab.  She notes a mild increase in dyspnea over the past several weeks.  She has a slight cough.  No fever.  The dyspnea improves when she uses her inhalers.  For about the past week she has had intermittent pain between the shoulder blades.  She notes the pain is better today.  The pain resolves when she changes position.  No rash or diarrhea.  No leg swelling or calf pain.  Objective:  Vital signs in last 24 hours:  Blood pressure 131/79, pulse 85, temperature (!) 97.1 F (36.2 C), temperature source Oral, resp. rate 19, height '5\' 3"'  (1.6 m), weight 125 lb 11.2 oz (57 kg), SpO2 100 %.    HEENT: No thrush or ulcers. Resp: Distant breath sounds.  No respiratory distress. Cardio: Regular rate and rhythm. GI: Abdomen soft and nontender.  No hepatomegaly. Vascular: No leg edema.  Calves soft and nontender. Musculoskeletal: Mild tenderness left mid paraspinal region. Neuro: Alert and oriented. Skin: No rash.   Lab Results:  Lab Results  Component Value Date   WBC 7.7 04/15/2018   HGB 13.2 04/15/2018   HCT 42.7 04/15/2018   MCV 87.1 04/15/2018   PLT 224 04/15/2018   NEUTROABS 6.1 04/15/2018    Imaging:  No results found.  Medications: I have reviewed the patient's current medications.  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  PDL1 60%  Left lung radiation 11/08/2016  through 11/21/2016  CT chest 12/12/2016-reexpansion of left upper lobe with a decreased left upper lobe mass, unchanged mediastinal adenopathy, progression of a splenic metastasis/pancreatic tail/gastrohepatic ligament metastasis, right lower lobe pneumonia  Cycle 1 Pembrolizumab 12/14/2016  Cycle 2 Pembrolizumab 01/03/2017  Cycle 3 Pembrolizumab 01/24/2017  Cycle 4 Pembrolizumab 02/12/2017  CT 03/05/2018-decrease in left upper lobe mass, mediastinal adenopathy, and splenic mass  Cycle 5 pembrolizumab 03/07/2017  Cycle 6 Pembrolizumab 04/02/2017  Cycle 7 pembrolizumab 04/25/2017  Cycle 8 Pembrolizumab 05/14/2017  Cycle 9 Pembrolizumab 06/06/2017  CT 06/20/2017-mild decrease in left upper lobe mass, mild increase in paramediastinal left upper lobe nodule-radiation change?, decreased splenic metastasis  Cycle 10 pembrolizumab 06/25/2017  Cycle 11 pembrolizumab 07/18/2017  Cycle 12 pembrolizumab 08/29/2017  Cycle 13 pembrolizumab 09/17/2017  Cycle 14 Pembrolizumab 10/10/2017  CT chest 10/24/2017-slight enlargement of small mediastinal lymph nodes, increased left upper lobe consolidation, decreased size of spleen lesion  Cycle 15 pembrolizumab 10/29/2017  Cycle 16 Pembrolizumab 11/21/2017  Cycle 17 Pembrolizumab 12/11/2017  Cycle 18 pembrolizumab 01/02/2018  Cycle 19 Pembrolizumab 01/21/2018  Cycle 20 Pembrolizumab 02/13/2018  CT chest 02/27/2018- decreased left paratracheal node, progressive consolidation/fibrosis in the left upper lung, decreased size of splenic mass  Cycle 21 pembrolizumab 03/04/2018  Cycle 22 Pembrolizumab 03/27/2018  2. Acute respiratory failure secondary to #1  3. Oxygen dependent COPD  4. History of a NSTEMI 2015  5. Right lung pneumonia on chest CT 12/12/2016-treated with Levaquin  6.Grade 2 rash possibly related to Pembrolizumab. Treatment held 08/06/2017.Improved 08/29/2017, treatment resumed, rash has resolved  Disposition: Brooke [name]  appears  stable.  She has completed 22 cycles of pembrolizumab.  There is no clinical evidence of disease progression.  Plan to proceed with cycle 23 today as scheduled.  The increase in dyspnea may be related to underlying COPD.  She notes improvement after using inhalers.  She understands to contact the office with fever, chills, worsening cough, worsening dyspnea.  She will return for lab, follow-up in the next cycle of pembrolizumab in 3 weeks.  She will contact the office in the interim as outlined above or with any other problems.  Plan reviewed with [name].    [name] ANP/GNP-BC   04/15/2018  10:48 AM

## 2018-04-17 ENCOUNTER — Inpatient Hospital Stay: Payer: Medicare Other

## 2018-05-04 ENCOUNTER — Other Ambulatory Visit: Payer: Self-pay | Admitting: Oncology

## 2018-05-08 ENCOUNTER — Inpatient Hospital Stay: Payer: Medicare Other | Attending: Oncology

## 2018-05-08 ENCOUNTER — Other Ambulatory Visit: Payer: Medicare Other

## 2018-05-08 ENCOUNTER — Telehealth: Payer: Self-pay

## 2018-05-08 ENCOUNTER — Inpatient Hospital Stay (HOSPITAL_BASED_OUTPATIENT_CLINIC_OR_DEPARTMENT_OTHER): Payer: Medicare Other | Admitting: Oncology

## 2018-05-08 ENCOUNTER — Ambulatory Visit: Payer: Medicare Other | Admitting: Nurse Practitioner

## 2018-05-08 ENCOUNTER — Inpatient Hospital Stay: Payer: Medicare Other

## 2018-05-08 VITALS — BP 118/75 | HR 74 | Temp 98.1°F | Resp 18 | Ht 63.0 in | Wt 126.2 lb

## 2018-05-08 DIAGNOSIS — C7889 Secondary malignant neoplasm of other digestive organs: Secondary | ICD-10-CM | POA: Diagnosis not present

## 2018-05-08 DIAGNOSIS — C3412 Malignant neoplasm of upper lobe, left bronchus or lung: Secondary | ICD-10-CM | POA: Diagnosis not present

## 2018-05-08 DIAGNOSIS — Z5112 Encounter for antineoplastic immunotherapy: Secondary | ICD-10-CM | POA: Insufficient documentation

## 2018-05-08 DIAGNOSIS — J440 Chronic obstructive pulmonary disease with acute lower respiratory infection: Secondary | ICD-10-CM

## 2018-05-08 DIAGNOSIS — Z9981 Dependence on supplemental oxygen: Secondary | ICD-10-CM

## 2018-05-08 DIAGNOSIS — Z79899 Other long term (current) drug therapy: Secondary | ICD-10-CM | POA: Insufficient documentation

## 2018-05-08 LAB — CBC WITH DIFFERENTIAL (CANCER CENTER ONLY)
Abs Immature Granulocytes: 0.02 10*3/uL (ref 0.00–0.07)
Basophils Absolute: 0 10*3/uL (ref 0.0–0.1)
Basophils Relative: 0 %
EOS PCT: 3 %
Eosinophils Absolute: 0.2 10*3/uL (ref 0.0–0.5)
HCT: 41.1 % (ref 36.0–46.0)
HEMOGLOBIN: 13.1 g/dL (ref 12.0–15.0)
Immature Granulocytes: 0 %
Lymphocytes Relative: 17 %
Lymphs Abs: 1 10*3/uL (ref 0.7–4.0)
MCH: 27.3 pg (ref 26.0–34.0)
MCHC: 31.9 g/dL (ref 30.0–36.0)
MCV: 85.6 fL (ref 80.0–100.0)
MONOS PCT: 7 %
Monocytes Absolute: 0.4 10*3/uL (ref 0.1–1.0)
Neutro Abs: 4.5 10*3/uL (ref 1.7–7.7)
Neutrophils Relative %: 73 %
Platelet Count: 233 10*3/uL (ref 150–400)
RBC: 4.8 MIL/uL (ref 3.87–5.11)
RDW: 14.6 % (ref 11.5–15.5)
WBC Count: 6.2 10*3/uL (ref 4.0–10.5)
nRBC: 0 % (ref 0.0–0.2)

## 2018-05-08 LAB — CMP (CANCER CENTER ONLY)
ALT: 16 U/L (ref 0–44)
AST: 17 U/L (ref 15–41)
Albumin: 3.6 g/dL (ref 3.5–5.0)
Alkaline Phosphatase: 102 U/L (ref 38–126)
Anion gap: 7 (ref 5–15)
BUN: 13 mg/dL (ref 8–23)
CO2: 25 mmol/L (ref 22–32)
CREATININE: 0.65 mg/dL (ref 0.44–1.00)
Calcium: 9.3 mg/dL (ref 8.9–10.3)
Chloride: 107 mmol/L (ref 98–111)
GFR, Est AFR Am: >60 mL/min (ref >60)
GFR, Estimated: >60 mL/min (ref >60)
Glucose, Bld: 97 mg/dL (ref 70–99)
Potassium: 4.1 mmol/L (ref 3.5–5.1)
Sodium: 139 mmol/L (ref 135–145)
Total Bilirubin: 0.4 mg/dL (ref 0.3–1.2)
Total Protein: 7.3 g/dL (ref 6.5–8.1)

## 2018-05-08 MED ORDER — SODIUM CHLORIDE 0.9 % IV SOLN
Freq: Once | INTRAVENOUS | Status: AC
  Administered 2018-05-08: 13:00:00 via INTRAVENOUS
  Filled 2018-05-08: qty 250

## 2018-05-08 MED ORDER — SODIUM CHLORIDE 0.9 % IV SOLN
200.0000 mg | Freq: Once | INTRAVENOUS | Status: AC
  Administered 2018-05-08: 200 mg via INTRAVENOUS
  Filled 2018-05-08: qty 8

## 2018-05-08 NOTE — Progress Notes (Signed)
[place] OFFICE PROGRESS NOTE   Diagnosis: Non-small cell lung cancer  INTERVAL HISTORY:   Brooke [name] returns as scheduled.  She completed another treatment with pembrolizumab on 04/15/2018.  She reports soreness at the bilateral anterolateral chest wall.  She had a recent fall, but did not injure her chest.  No rash or diarrhea.  She reports malaise.  Objective:  Vital signs in last 24 hours:  Blood pressure 118/75, pulse 74, temperature 98.1 F (36.7 C), temperature source Oral, resp. rate 18, height '5\' 3"'  (1.6 m), weight 126 lb 3.2 oz (57.2 kg), SpO2 100 %.    HEENT: Neck without mass Lymphatics: No cervical or supraclavicular nodes Resp: Lungs clear bilaterally, no respiratory distress Cardio: Regular rate and rhythm GI: No hepatomegaly, nontender Vascular: No leg edema  Skin: No rash Musculoskeletal: Mild tenderness at the mid posterior lateral chest wall bilaterally, no rash, no mass   Lab Results:  Lab Results  Component Value Date   WBC 6.2 05/08/2018   HGB 13.1 05/08/2018   HCT 41.1 05/08/2018   MCV 85.6 05/08/2018   PLT 233 05/08/2018   NEUTROABS 4.5 05/08/2018    CMP  Lab Results  Component Value Date   NA 139 05/08/2018   K 4.1 05/08/2018   CL 107 05/08/2018   CO2 25 05/08/2018   GLUCOSE 97 05/08/2018   BUN 13 05/08/2018   CREATININE 0.65 05/08/2018   CALCIUM 9.3 05/08/2018   PROT 7.3 05/08/2018   ALBUMIN 3.6 05/08/2018   AST 17 05/08/2018   ALT 16 05/08/2018   ALKPHOS 102 05/08/2018   BILITOT 0.4 05/08/2018   GFRNONAA >60 05/08/2018   GFRAA >60 05/08/2018     Medications: I have reviewed the patient's current medications.   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  PDL1 60%  Left lung radiation 11/08/2016 through 11/21/2016  CT chest 12/12/2016-reexpansion of left upper lobe with a decreased left upper lobe mass, unchanged mediastinal adenopathy, progression of a splenic metastasis/pancreatic tail/gastrohepatic ligament metastasis, right lower lobe pneumonia  Cycle 1 Pembrolizumab 12/14/2016  Cycle 2 Pembrolizumab 01/03/2017  Cycle 3 Pembrolizumab 01/24/2017  Cycle 4 Pembrolizumab 02/12/2017  CT 03/05/2018-decrease in left upper lobe mass, mediastinal adenopathy, and splenic mass  Cycle 5 pembrolizumab 03/07/2017  Cycle 6 Pembrolizumab 04/02/2017  Cycle 7 pembrolizumab 04/25/2017  Cycle 8 Pembrolizumab 05/14/2017  Cycle 9 Pembrolizumab 06/06/2017  CT 06/20/2017-mild decrease in left upper lobe mass, mild increase in paramediastinal left upper lobe nodule-radiation change?, decreased splenic metastasis  Cycle 10 pembrolizumab 06/25/2017  Cycle 11 pembrolizumab 07/18/2017  Cycle 12 pembrolizumab 08/29/2017  Cycle 13 pembrolizumab 09/17/2017  Cycle 14 Pembrolizumab 10/10/2017  CT chest 10/24/2017-slight enlargement of small mediastinal lymph nodes, increased left upper lobe consolidation, decreased size of spleen lesion  Cycle 15 pembrolizumab 10/29/2017  Cycle 16 Pembrolizumab 11/21/2017  Cycle 17 Pembrolizumab 12/11/2017  Cycle 18 pembrolizumab 01/02/2018  Cycle 19 Pembrolizumab 01/21/2018  Cycle 20 Pembrolizumab 02/13/2018  CT chest 02/27/2018- decreased left paratracheal node, progressive consolidation/fibrosis in the left upper lung, decreased size of splenic mass  Cycle 21 pembrolizumab 03/04/2018  Cycle 22 Pembrolizumab 03/27/2018   Cycle 23 pembrolizumab 04/15/2018  Cycle  24 pembrolizumab 05/08/2018  2. Acute respiratory failure secondary to  #1  3. Oxygen dependent COPD  4. History of a NSTEMI 2015  5. Right lung pneumonia on chest CT 12/12/2016-treated with Levaquin  6.Grade 2 rash possibly related to Pembrolizumab. Treatment held 08/06/2017.Improved 08/29/2017, treatment resumed, rash has resolved    Disposition: Brooke [name] appears unchanged.  She will complete another cycle of pembrolizumab today.  She will return for an office and pembrolizumab in 3 weeks.  We will check the TSH when she returns in 3 weeks.  The plan is to schedule a restaging chest CT prior to the cycle of pembrolizumab plan for 06/19/2018.  15 minutes were spent with the patient today.  The majority of the time was used for counseling and coordination of care.  Betsy Coder, MD  05/08/2018  10:52 AM

## 2018-05-08 NOTE — Patient Instructions (Signed)
[facility] Discharge Instructions for Patients Receiving Chemotherapy  Today you received the following chemotherapy agents:  Keytruda.  To help prevent nausea and vomiting after your treatment, we encourage you to take your nausea medication as directed.   If you develop nausea and vomiting that is not controlled by your nausea medication, call the clinic.   BELOW ARE SYMPTOMS THAT SHOULD BE REPORTED IMMEDIATELY:  *FEVER GREATER THAN 100.5 F  *CHILLS WITH OR WITHOUT FEVER  NAUSEA AND VOMITING THAT IS NOT CONTROLLED WITH YOUR NAUSEA MEDICATION  *UNUSUAL SHORTNESS OF BREATH  *UNUSUAL BRUISING OR BLEEDING  TENDERNESS IN MOUTH AND THROAT WITH OR WITHOUT PRESENCE OF ULCERS  *URINARY PROBLEMS  *BOWEL PROBLEMS  UNUSUAL RASH Items with * indicate a potential emergency and should be followed up as soon as possible.  Feel free to call the clinic should you have any questions or concerns. The clinic phone number is [phone].  Please show the CHEMO ALERT CARD at check-in to the Emergency Department and triage nurse.    

## 2018-05-08 NOTE — Telephone Encounter (Signed)
Printed avs and calender of upcoming appointment. Per 1/2 los 

## 2018-05-27 ENCOUNTER — Inpatient Hospital Stay: Payer: Medicare Other

## 2018-05-27 ENCOUNTER — Inpatient Hospital Stay (HOSPITAL_BASED_OUTPATIENT_CLINIC_OR_DEPARTMENT_OTHER): Payer: Medicare Other | Admitting: Nurse Practitioner

## 2018-05-27 ENCOUNTER — Encounter: Payer: Self-pay | Admitting: Nurse Practitioner

## 2018-05-27 ENCOUNTER — Telehealth: Payer: Self-pay | Admitting: Nurse Practitioner

## 2018-05-27 VITALS — BP 135/79 | HR 73 | Temp 98.0°F | Resp 17 | Ht 63.0 in | Wt 123.7 lb

## 2018-05-27 DIAGNOSIS — C7889 Secondary malignant neoplasm of other digestive organs: Secondary | ICD-10-CM | POA: Diagnosis not present

## 2018-05-27 DIAGNOSIS — C3412 Malignant neoplasm of upper lobe, left bronchus or lung: Secondary | ICD-10-CM | POA: Diagnosis not present

## 2018-05-27 DIAGNOSIS — Z5112 Encounter for antineoplastic immunotherapy: Secondary | ICD-10-CM | POA: Diagnosis not present

## 2018-05-27 DIAGNOSIS — Z9981 Dependence on supplemental oxygen: Secondary | ICD-10-CM | POA: Diagnosis not present

## 2018-05-27 DIAGNOSIS — J440 Chronic obstructive pulmonary disease with acute lower respiratory infection: Secondary | ICD-10-CM | POA: Diagnosis not present

## 2018-05-27 LAB — CMP (CANCER CENTER ONLY)
ALT: 18 U/L (ref 0–44)
AST: 18 U/L (ref 15–41)
Albumin: 3.8 g/dL (ref 3.5–5.0)
Alkaline Phosphatase: 103 U/L (ref 38–126)
Anion gap: 9 (ref 5–15)
BUN: 14 mg/dL (ref 8–23)
CO2: 26 mmol/L (ref 22–32)
Calcium: 9.6 mg/dL (ref 8.9–10.3)
Chloride: 104 mmol/L (ref 98–111)
Creatinine: 0.64 mg/dL (ref 0.44–1.00)
GFR, Est AFR Am: >60 mL/min (ref >60)
GFR, Estimated: >60 mL/min (ref >60)
Glucose, Bld: 100 mg/dL — ABNORMAL HIGH (ref 70–99)
POTASSIUM: 4.2 mmol/L (ref 3.5–5.1)
Sodium: 139 mmol/L (ref 135–145)
Total Bilirubin: 0.4 mg/dL (ref 0.3–1.2)
Total Protein: 7.5 g/dL (ref 6.5–8.1)

## 2018-05-27 LAB — TSH: TSH: 0.69 u[IU]/mL (ref 0.308–3.960)

## 2018-05-27 MED ORDER — SODIUM CHLORIDE 0.9 % IV SOLN
Freq: Once | INTRAVENOUS | Status: AC
  Administered 2018-05-27: 12:00:00 via INTRAVENOUS
  Filled 2018-05-27: qty 250

## 2018-05-27 MED ORDER — SODIUM CHLORIDE 0.9 % IV SOLN
200.0000 mg | Freq: Once | INTRAVENOUS | Status: AC
  Administered 2018-05-27: 200 mg via INTRAVENOUS
  Filled 2018-05-27: qty 8

## 2018-05-27 NOTE — Progress Notes (Signed)
[place] OFFICE PROGRESS NOTE   Diagnosis: Non-small cell lung cancer  INTERVAL HISTORY:   Brooke [name] returns as scheduled.  She completed another cycle of pembrolizumab 05/08/2018.  He denies nausea/vomiting.  No rash.  No diarrhea.  Continued dyspnea on exertion.  Slight cough.  No fever.  She thinks the cough is related to "postnasal drip".  She notes intermittent pain at the left mid back.  She is typically able to relieve the pain with position change.  No leg weakness or numbness.  No bowel or bladder dysfunction.  Objective:  Vital signs in last 24 hours:  Blood pressure 135/79, pulse 73, temperature 98 F (36.7 C), temperature source Oral, resp. rate 17, height _0  (1.6 m), weight 123 lb 11.2 oz (56.1 kg), SpO2 98 %.    HEENT: No thrush or ulcers. Resp: Lungs with scattered wheezes.  No respiratory distress. Cardio: Regular rate and rhythm. GI: Abdomen soft and nontender.  No hepatomegaly. Vascular: No leg edema.  Calves soft and nontender.  Skin: No rash.   Lab Results:  Lab Results  Component Value Date   WBC 6.2 05/08/2018   HGB 13.1 05/08/2018   HCT 41.1 05/08/2018   MCV 85.6 05/08/2018   PLT 233 05/08/2018   NEUTROABS 4.5 05/08/2018    Imaging:  No results found.  Medications: I have reviewed the patient's current medications.  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  PDL1 60%  Left lung radiation 11/08/2016 through 11/21/2016  CT chest 12/12/2016-reexpansion of left upper lobe with a decreased left upper  lobe mass, unchanged mediastinal adenopathy, progression of a splenic metastasis/pancreatic tail/gastrohepatic ligament metastasis, right lower lobe pneumonia  Cycle 1 Pembrolizumab 12/14/2016  Cycle 2 Pembrolizumab 01/03/2017  Cycle 3 Pembrolizumab 01/24/2017  Cycle 4 Pembrolizumab 02/12/2017  CT 03/05/2018-decrease in left upper lobe mass, mediastinal adenopathy, and splenic mass  Cycle 5 pembrolizumab 03/07/2017  Cycle 6 Pembrolizumab 04/02/2017  Cycle 7 pembrolizumab 04/25/2017  Cycle 8 Pembrolizumab 05/14/2017  Cycle 9 Pembrolizumab 06/06/2017  CT 06/20/2017-mild decrease in left upper lobe mass, mild increase in paramediastinal left upper lobe nodule-radiation change?, decreased splenic metastasis  Cycle 10 pembrolizumab 06/25/2017  Cycle 11 pembrolizumab 07/18/2017  Cycle 12 pembrolizumab 08/29/2017  Cycle 13 pembrolizumab 09/17/2017  Cycle 14 Pembrolizumab 10/10/2017  CT chest 10/24/2017-slight enlargement of small mediastinal lymph nodes, increased left upper lobe consolidation, decreased size of spleen lesion  Cycle 15 pembrolizumab 10/29/2017  Cycle 16 Pembrolizumab 11/21/2017  Cycle 17 Pembrolizumab 12/11/2017  Cycle 18 pembrolizumab 01/02/2018  Cycle 19 Pembrolizumab 01/21/2018  Cycle 20 Pembrolizumab 02/13/2018  CT chest 02/27/2018-decreased left paratracheal node, progressive consolidation/fibrosis in the left upper lung, decreased size of splenic mass  Cycle 21 pembrolizumab 03/04/2018  Cycle 22 Pembrolizumab 03/27/2018   Cycle 23 pembrolizumab 04/15/2018  Cycle 24 pembrolizumab 05/08/2018  Cycle 25 Pembrolizumab 05/27/2018  2. Acute respiratory failure secondary to #1  3. Oxygen dependent COPD  4. History of a NSTEMI 2015  5. Right lung pneumonia on chest CT 12/12/2016-treated with Levaquin  6.Grade 2 rash possibly related to Pembrolizumab. Treatment held 08/06/2017.Improved 08/29/2017, treatment resumed, rash has resolved  Disposition:  [name] appears stable.  She has completed 24 cycles of pembrolizumab.  Plan to proceed with cycle 25 pembrolizumab today as scheduled.  She will undergo a restaging chest CT prior to her next visit.  She will return for lab, follow-up, Pembrolizumab on 06/19/2018.  She will contact the office in the interim with any problems.  We specifically discussed worsening back pain or onset of other symptoms.  Plan reviewed with [name].    [name] ANP/GNP-BC   05/27/2018  10:20 AM

## 2018-05-27 NOTE — Addendum Note (Signed)
Addended by: Betsy Coder B on: 05/27/2018 11:47 AM   Modules accepted: Orders

## 2018-05-27 NOTE — Progress Notes (Signed)
Ok to treat with just CMP today per [name], NP

## 2018-05-27 NOTE — Telephone Encounter (Signed)
Scheduled appt per 01/21 los.  Printed calendar per patient request.

## 2018-05-27 NOTE — Patient Instructions (Signed)
[facility] Discharge Instructions for Patients Receiving Chemotherapy  Today you received the following chemotherapy agents:  Keytruda.  To help prevent nausea and vomiting after your treatment, we encourage you to take your nausea medication as directed.   If you develop nausea and vomiting that is not controlled by your nausea medication, call the clinic.   BELOW ARE SYMPTOMS THAT SHOULD BE REPORTED IMMEDIATELY:  *FEVER GREATER THAN 100.5 F  *CHILLS WITH OR WITHOUT FEVER  NAUSEA AND VOMITING THAT IS NOT CONTROLLED WITH YOUR NAUSEA MEDICATION  *UNUSUAL SHORTNESS OF BREATH  *UNUSUAL BRUISING OR BLEEDING  TENDERNESS IN MOUTH AND THROAT WITH OR WITHOUT PRESENCE OF ULCERS  *URINARY PROBLEMS  *BOWEL PROBLEMS  UNUSUAL RASH Items with * indicate a potential emergency and should be followed up as soon as possible.  Feel free to call the clinic should you have any questions or concerns. The clinic phone number is [phone].  Please show the CHEMO ALERT CARD at check-in to the Emergency Department and triage nurse.    

## 2018-06-13 ENCOUNTER — Ambulatory Visit: Payer: Medicare Other | Admitting: Internal Medicine

## 2018-06-13 ENCOUNTER — Encounter: Payer: Self-pay | Admitting: Internal Medicine

## 2018-06-13 VITALS — BP 120/70 | HR 87 | Ht 63.0 in | Wt 123.2 lb

## 2018-06-13 DIAGNOSIS — J9611 Chronic respiratory failure with hypoxia: Secondary | ICD-10-CM | POA: Diagnosis not present

## 2018-06-13 DIAGNOSIS — J449 Chronic obstructive pulmonary disease, unspecified: Secondary | ICD-10-CM

## 2018-06-13 NOTE — Progress Notes (Signed)
Subjective:    Patient ID: Brooke [name], female   DOB: 11-27-1945,    MRN: [id number]     Brief patient profile:  6 yowf quit smoking 2009 on 02 noct 2012 maint on symbicort 160 since aorund  2014 referred to pulmonary clinic 02/15/2016 by Dr   Kathryne Eriksson for RUL Lung mass dx with Stage IV nsc 11/01/16 in setting of GOLD IV copd      History of Present Illness  02/15/2016 1st [facility] office visit/ Faydra Korman  GOLD IV copd/ symb 160 2bid maint  Chief Complaint  Patient presents with  . Pulmonary Consult    referred by Dr. Kathryne Eriksson for COPD.  MMRC2 = can't walk a nl pace on a flat grade s sob but does fine slow and flat eg [employer] on 2lpm  New onset bloody mucus plugs November 11 2015 assoc with wt loss  02 2lpm hs and with walking / rare saba needed rec For cough > mucinex up to 1200 mg every 12 hours as needed Rec:  Fob but refused   11/01/16   CT directed bx >>>  Pos NSC LUL    Diagnosis:   Stage IV NSCLC, Squamous cell carcinoma of the left upper lobe.     Indication for treatment:  palliative       Radiation treatment dates:   11/08/2016 to 11/21/2016  Site/dose:   The Left lung was treated to 30 Gy in 10 fractions at 3 Gy per fraction.   Beams/energy:   3D // 10X, 6X    12/06/2016  f/u ov/Miguel Christiana re: post hosp f/u -  Now on dulera 200 bid and duoneb Chief Complaint  Patient presents with  . HFU    Breathing is doing well. She is using Duoneb 2 x daily and uses albuterol inhaler 1-2 x per wk on average.   presently in SNF ever since last admit 6/18-29/18 for  Active Problems:   COPD  GOLD IV    Mass of upper lobe of left lung   Chronic respiratory failure with hypoxia (HCC)   COPD exacerbation (HCC)   Hypokalemia   Microcytic anemia   SOB (shortness of breath)   Protein-calorie malnutrition, severe Doe = 2lpm x 139ft with rolling walking  Sleeping ok at < 30 degrees Doe = 2lpm x 136ft with rolling walker/2lpm Sleeping ok at < 30 degrees   rec 02 can be adjusted down and off for saturations above 90%       12/09/2017  f/u ov/Samary Shatz re: GOLD IV copd/  No 02 at rest/ 2lpm sleep and with activity  Chief Complaint  Patient presents with  . Follow-up    Breathing is overall doing well. She has occ cough with white sputum- relates to PND.  She uses her albuterol inhaler 2 x daily on average. She states she uses neb before she knows she is going somewhere.   Dyspnea:  MMRC2 = can't walk a nl pace on a flat grade s sob but does fine slow and flat eg still pushing cart  Cough: min pnd  SABA use: as above  rec Continue  symbicort and add stiolto 2 pffs each am - - if you don't like it for any reason resume the symbicort alone as per your med calendar  Please schedule a follow up visit in 3 months but call sooner if needed  - add:  Pt notified above typo should have said add spiriva x 2 pffs each am  and continue symbicort Take 2 puffs first thing in am and then another 2 puffs about 12 hours later.   - 12/12/2017 informed could not tol spiriva due to dry mouth   03/14/2018  f/u ov/Tanetta Fuhriman re:  GOLD IV / 02 is 2lpm  Hs and 2lpm exertion/   RA sitting / just on symbicort 160 2bid  Chief Complaint  Patient presents with  . Follow-up    increased cough with white sputum. She states her breathing has been doing well. She is using her albuterol inhaler 2 x per wk and neb maybe 2 x  per wk on average.    Dyspnea:  [employer] ok pushing cart on 2lpm  Cough: more since 2 weeks  Sleeping: flat bed/ 2-3 pillow  SABA use: avg as above 02: as above   rec Work on inhaler technique:   Ok to take augmentin unless it makes you nauseated  Please schedule a follow up visit in 3 months but call sooner if needed      06/13/2018  f/u ov/Marquise Wicke re: GOLD IV / 02 hs and prn  Chief Complaint  Patient presents with  . Shortness of Breath    Worse since the last visit.  Dyspnea:  Still doing [employer]  On 2lpm but not checking sats as rec with activity   Cough: dry worse day than noct Sleeping: falt bed / couple of pillows SABA use: helps some / uses once a day esp in am 02: 2lpm hs and prn    No obvious day to day or daytime variability or assoc excess/ purulent sputum or mucus plugs or hemoptysis or cp or chest tightness, subjective wheeze or overt sinus or hb symptoms.    Sleeping as above  without nocturnal  or early am exacerbation  of respiratory  c/o's or need for noct saba. Also denies any obvious fluctuation of symptoms with weather or environmental changes or other aggravating or alleviating factors except as outlined above   No unusual exposure hx or h/o childhood pna/ asthma or knowledge of premature birth.  Current Allergies, Complete Past Medical History, Past Surgical History, Family History, and Social History were reviewed in [employer] record.  ROS  The following are not active complaints unless bolded Hoarseness, sore throat, dysphagia, dental problems, itching, sneezing,  nasal congestion or discharge of excess mucus or purulent secretions, ear ache,   fever, chills, sweats, unintended wt loss or wt gain, classically pleuritic or exertional cp,  orthopnea pnd or arm/hand swelling  or leg swelling, presyncope, palpitations, abdominal pain, anorexia, nausea, vomiting, diarrhea  or change in bowel habits or change in bladder habits, change in stools or change in urine, dysuria, hematuria,  rash, arthralgias, visual complaints, headache, numbness, weakness or ataxia or problems with walking or coordination,  change in mood or  memory.        Current Meds  Medication Sig  . acetaminophen (TYLENOL) 325 MG tablet Per bottle as needed  . albuterol (PROVENTIL HFA;VENTOLIN HFA) 108 (90 BASE) MCG/ACT inhaler Inhale 2 puffs into the lungs every 4 (four) hours as needed for wheezing or shortness of breath. **PLAN B**  . ALPRAZolam (XANAX) 0.25 MG tablet 1/2-1 twice daily as needed  . atorvastatin (LIPITOR) 20  MG tablet TAKE 1 TABLET(20 MG) BY MOUTH DAILY  . budesonide-formoterol (SYMBICORT) 160-4.5 MCG/ACT inhaler INHALE 2 PUFFS INTO THE LUNGS TWICE DAILY  . guaiFENesin (MUCINEX) 600 MG 12 hr tablet Take 600 mg by mouth 2 (two)  times daily as needed for cough or to loosen phlegm.   Marland Kitchen ibuprofen (ADVIL,MOTRIN) 600 MG tablet Take 600 mg by mouth every 6 (six) hours as needed.  Marland Kitchen ipratropium-albuterol (DUONEB) 0.5-2.5 (3) MG/3ML SOLN Take 3 mLs by nebulization every 4 (four) hours as needed (wheezing/shortness of breath). **PLAN C**  . nitroGLYCERIN (NITROSTAT) 0.4 MG SL tablet ONE TABLET UNDER TONGUE AS NEEDED FOR CHEST PAIN EVERY 5 MINUTES FOR 3 DOSES  . NON FORMULARY [facility]  Onychomycosis Nail Lacquer -  Fluconazole 2%, Terbinafine 1% DMSO/undecylenic acid 25% Apply to affected nail once daily Qty. 120 gm 3 refills  . OXYGEN 2lpm 24/7  . Simethicone (GAS RELIEF) 180 MG CAPS Use as needed                Objective:   Physical Exam     06/13/2018         123  03/14/2018       127  12/09/2017         125   06/10/2017         119  01/08/2017         112   12/06/2016        110   02/15/16 119 lb (54 kg)  08/18/15 137 lb 12.8 oz (62.5 kg)  02/17/15 138 lb 1.9 oz (62.7 kg)     amb wf nad  Vital signs reviewed - Note on arrival 02 sats  99% on 2lpm   HEENT: nl dentition / oropharynx. Nl external ear canals without cough reflex -  Mild bilateral non-specific turbinate edema     NECK :  without JVD/Nodes/TM/ nl carotid upstrokes bilaterally   LUNGS: no acc muscle use,  Mod barrel  contour chest wall with bilateral  Distant bs s audible wheeze and  without cough on insp or exp maneuver and mod  Hyperresonant  to  percussion bilaterally     CV:  RRR  no s3 or murmur or increase in P2, and no edema   ABD:  soft and nontender with pos mid insp Hoover's  in the supine position. No bruits or organomegaly appreciated, bowel sounds nl  MS:   Nl gait/  ext warm without deformities, calf  tenderness, cyanosis or clubbing No obvious joint restrictions   SKIN: warm and dry without lesions/ R toe bandaged due to "toenail infection"     NEURO:  alert, approp, nl sensorium with  no motor or cerebellar deficits apparent.             Assessment:

## 2018-06-13 NOTE — Patient Instructions (Addendum)
Plan A = Automatic = symbicort 160 Take 2 puffs first thing in am and then another 2 puffs about 12 hours later.    Work on inhaler technique:  relax and gently blow all the way out then take a nice smooth deep breath back in, triggering the inhaler at same time you start breathing in.  Hold for up to 5 seconds if you can. Blow out thru nose. Rinse and gargle with water when done     Plan B = Backup Only use your albuterol inhaler as a rescue medication to be used if you can't catch your breath by resting or doing a relaxed purse lip breathing pattern.  - The less you use it, the better it will work when you need it. - Ok to use the inhaler up to 2 puffs  every 4 hours if you must but call for appointment if use goes up over your usual need - Don't leave home without it !!  (think of it like the spare tire for your car)   Plan C = Crisis - only use your albuterol/ipatropium nebulizer if you first try Plan B and it fails to help > ok to use the nebulizer up to every 4 hours but if start needing it regularly call for immediate appointment   Please schedule a follow up visit in 3 months but call sooner if needed  - add chart review does not show alpha one screen done

## 2018-06-14 ENCOUNTER — Other Ambulatory Visit: Payer: Self-pay | Admitting: Oncology

## 2018-06-15 ENCOUNTER — Encounter: Payer: Self-pay | Admitting: Internal Medicine

## 2018-06-15 NOTE — Assessment & Plan Note (Signed)
rx 2lpm hs and prn daytime since 2014  -  06/13/2018   Walked on 2lpm x  3 laps @  approx 221ft each @ nl pace  stopped due to  Sob after each lap  s desat     rec as of 06/13/2018  = 2lpm hs and prn with activity with goal of keeping sats > 94% but needs to learn to pace herself on her own or consider option of pulmonary rehab   I had an extended discussion with the patient  reviewing all relevant studies completed to date and  lasting 15 to 20 minutes of a 25 minute visit  which included directly observing ambulatory 02 saturation study documented in a/p section of  today's  office note.  See device teaching which extended face to face time for this visit   Each maintenance medication was reviewed in detail including most importantly the difference between maintenance and prns and under what circumstances the prns are to be triggered using an action plan format that is not reflected in the computer generated alphabetically organized AVS.     Please see AVS for specific instructions unique to this visit that I personally wrote and verbalized to the the pt in detail and then reviewed with pt  by my nurse highlighting any changes in therapy recommended at today's visit .

## 2018-06-15 NOTE — Assessment & Plan Note (Addendum)
Spirometry 02/15/2016  FEV1 0.60 (28%)  Ratio 38 with classic curvature  p am symbicort 160  - 01/08/2017    continue dulera 200 2bid  - 12/09/2017  After extensive coaching inhaler device  effectiveness =    90% with smi >add spiriva to  symbicort  - 12/12/2017 informed could not tol spiriva due to dry mouth   - 06/13/2018  After extensive coaching inhaler device,  effectiveness =    75%   Severe copd / 02 dep and really should be on lama as well but can't tol the anticholinergic effects so rec continue symbicort 160 but work on hfa / consider pulmonary rehab if not evidence of progressive or recurrent  lung ca on next ct

## 2018-06-16 ENCOUNTER — Encounter (HOSPITAL_COMMUNITY): Payer: Self-pay

## 2018-06-16 ENCOUNTER — Ambulatory Visit (HOSPITAL_COMMUNITY)
Admission: RE | Admit: 2018-06-16 | Discharge: 2018-06-16 | Disposition: A | Discharge status: Home / Self Care | Payer: Medicare Other | Source: Ambulatory Visit | Attending: Nurse Practitioner | Admitting: Nurse Practitioner

## 2018-06-16 DIAGNOSIS — C3412 Malignant neoplasm of upper lobe, left bronchus or lung: Secondary | ICD-10-CM

## 2018-06-16 HISTORY — DX: Malignant (primary) neoplasm, unspecified: C80.1

## 2018-06-16 HISTORY — DX: Secondary malignant neoplasm of liver and intrahepatic bile duct: C78.7

## 2018-06-16 HISTORY — DX: Secondary malignant neoplasm of other digestive organs: C78.89

## 2018-06-16 MED ORDER — IOHEXOL 300 MG/ML  SOLN
75.0000 mL | Freq: Once | INTRAMUSCULAR | Status: AC | PRN
  Administered 2018-06-16: 75 mL via INTRAVENOUS

## 2018-06-19 ENCOUNTER — Inpatient Hospital Stay: Payer: Medicare Other

## 2018-06-19 ENCOUNTER — Inpatient Hospital Stay: Payer: Medicare Other | Attending: Oncology

## 2018-06-19 ENCOUNTER — Telehealth: Payer: Self-pay | Admitting: Oncology

## 2018-06-19 ENCOUNTER — Inpatient Hospital Stay (HOSPITAL_BASED_OUTPATIENT_CLINIC_OR_DEPARTMENT_OTHER): Payer: Medicare Other | Admitting: Nurse Practitioner

## 2018-06-19 ENCOUNTER — Encounter: Payer: Self-pay | Admitting: Nurse Practitioner

## 2018-06-19 VITALS — BP 126/85 | HR 78 | Temp 98.0°F | Resp 18 | Ht 63.0 in | Wt 122.9 lb

## 2018-06-19 DIAGNOSIS — Z9981 Dependence on supplemental oxygen: Secondary | ICD-10-CM

## 2018-06-19 DIAGNOSIS — Z5112 Encounter for antineoplastic immunotherapy: Secondary | ICD-10-CM | POA: Diagnosis not present

## 2018-06-19 DIAGNOSIS — J440 Chronic obstructive pulmonary disease with acute lower respiratory infection: Secondary | ICD-10-CM | POA: Diagnosis not present

## 2018-06-19 DIAGNOSIS — I252 Old myocardial infarction: Secondary | ICD-10-CM | POA: Diagnosis not present

## 2018-06-19 DIAGNOSIS — C3412 Malignant neoplasm of upper lobe, left bronchus or lung: Secondary | ICD-10-CM

## 2018-06-19 DIAGNOSIS — M549 Dorsalgia, unspecified: Secondary | ICD-10-CM

## 2018-06-19 DIAGNOSIS — J9600 Acute respiratory failure, unspecified whether with hypoxia or hypercapnia: Secondary | ICD-10-CM

## 2018-06-19 DIAGNOSIS — C7889 Secondary malignant neoplasm of other digestive organs: Secondary | ICD-10-CM | POA: Insufficient documentation

## 2018-06-19 LAB — CMP (CANCER CENTER ONLY)
ALT: 14 U/L (ref 0–44)
AST: 16 U/L (ref 15–41)
Albumin: 3.8 g/dL (ref 3.5–5.0)
Alkaline Phosphatase: 85 U/L (ref 38–126)
Anion gap: 5 (ref 5–15)
BILIRUBIN TOTAL: 0.4 mg/dL (ref 0.3–1.2)
BUN: 15 mg/dL (ref 8–23)
CALCIUM: 9.4 mg/dL (ref 8.9–10.3)
CO2: 27 mmol/L (ref 22–32)
Chloride: 107 mmol/L (ref 98–111)
Creatinine: 0.68 mg/dL (ref 0.44–1.00)
GFR, Est AFR Am: >60 mL/min (ref >60)
GFR, Estimated: >60 mL/min (ref >60)
Glucose, Bld: 101 mg/dL — ABNORMAL HIGH (ref 70–99)
Potassium: 4.3 mmol/L (ref 3.5–5.1)
Sodium: 139 mmol/L (ref 135–145)
Total Protein: 7.2 g/dL (ref 6.5–8.1)

## 2018-06-19 LAB — CBC WITH DIFFERENTIAL (CANCER CENTER ONLY)
Abs Immature Granulocytes: 0.01 10*3/uL (ref 0.00–0.07)
Basophils Absolute: 0 10*3/uL (ref 0.0–0.1)
Basophils Relative: 1 %
Eosinophils Absolute: 0.2 10*3/uL (ref 0.0–0.5)
Eosinophils Relative: 4 %
HCT: 43 % (ref 36.0–46.0)
Hemoglobin: 13.4 g/dL (ref 12.0–15.0)
Immature Granulocytes: 0 %
Lymphocytes Relative: 19 %
Lymphs Abs: 1 10*3/uL (ref 0.7–4.0)
MCH: 27.8 pg (ref 26.0–34.0)
MCHC: 31.2 g/dL (ref 30.0–36.0)
MCV: 89.2 fL (ref 80.0–100.0)
Monocytes Absolute: 0.4 10*3/uL (ref 0.1–1.0)
Monocytes Relative: 7 %
Neutro Abs: 3.7 10*3/uL (ref 1.7–7.7)
Neutrophils Relative %: 69 %
Platelet Count: 200 10*3/uL (ref 150–400)
RBC: 4.82 MIL/uL (ref 3.87–5.11)
RDW: 14.9 % (ref 11.5–15.5)
WBC Count: 5.3 10*3/uL (ref 4.0–10.5)
nRBC: 0 % (ref 0.0–0.2)

## 2018-06-19 MED ORDER — SODIUM CHLORIDE 0.9 % IV SOLN
200.0000 mg | Freq: Once | INTRAVENOUS | Status: AC
  Administered 2018-06-19: 200 mg via INTRAVENOUS
  Filled 2018-06-19: qty 8

## 2018-06-19 MED ORDER — SODIUM CHLORIDE 0.9 % IV SOLN
Freq: Once | INTRAVENOUS | Status: AC
  Administered 2018-06-19: 12:00:00 via INTRAVENOUS
  Filled 2018-06-19: qty 250

## 2018-06-19 NOTE — Progress Notes (Addendum)
[facility] OFFICE PROGRESS NOTE   Diagnosis: Non-small cell lung cancer  INTERVAL HISTORY:   Brooke [name] returns as scheduled.  She completed another cycle of pembrolizumab 05/27/2018.  She denies nausea/vomiting.  No mouth sores.  No diarrhea.  No skin rash.  Breathing overall is better.  Back pain is also better.  Objective:  Vital signs in last 24 hours:  Blood pressure 126/85, pulse 78, temperature 98 F (36.7 C), temperature source Oral, resp. rate 18, height '5\' 3"'  (1.6 m), weight 122 lb 14.4 oz (55.7 kg), SpO2 100 %.    HEENT: No thrush or ulcers. Resp: Lungs clear posteriorly.  Anteriorly faint expiratory wheezes.  No respiratory distress. Cardio: Regular rate and rhythm. GI: Abdomen soft and nontender.  No hepatomegaly. Vascular: No leg edema.  Skin: No rash.   Lab Results:  Lab Results  Component Value Date   WBC 5.3 06/19/2018   HGB 13.4 06/19/2018   HCT 43.0 06/19/2018   MCV 89.2 06/19/2018   PLT 200 06/19/2018   NEUTROABS 3.7 06/19/2018    Imaging:  No results found.  Medications: I have reviewed the patient's current medications.  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  PDL1 60%  Left lung radiation 11/08/2016 through 11/21/2016  CT chest 12/12/2016-reexpansion of left upper lobe with a decreased left upper lobe mass, unchanged mediastinal adenopathy, progression of a splenic metastasis/pancreatic tail/gastrohepatic ligament metastasis, right lower lobe pneumonia  Cycle 1 Pembrolizumab 12/14/2016  Cycle 2  Pembrolizumab 01/03/2017  Cycle 3 Pembrolizumab 01/24/2017  Cycle 4 Pembrolizumab 02/12/2017  CT 03/05/2018-decrease in left upper lobe mass, mediastinal adenopathy, and splenic mass  Cycle 5 pembrolizumab 03/07/2017  Cycle 6 Pembrolizumab 04/02/2017  Cycle 7 pembrolizumab 04/25/2017  Cycle 8 Pembrolizumab 05/14/2017  Cycle 9 Pembrolizumab 06/06/2017  CT 06/20/2017-mild decrease in left upper lobe mass, mild increase in paramediastinal left upper lobe nodule-radiation change?, decreased splenic metastasis  Cycle 10 pembrolizumab 06/25/2017  Cycle 11 pembrolizumab 07/18/2017  Cycle 12 pembrolizumab 08/29/2017  Cycle 13 pembrolizumab 09/17/2017  Cycle 14 Pembrolizumab 10/10/2017  CT chest 10/24/2017-slight enlargement of small mediastinal lymph nodes, increased left upper lobe consolidation, decreased size of spleen lesion  Cycle 15 pembrolizumab 10/29/2017  Cycle 16 Pembrolizumab 11/21/2017  Cycle 17 Pembrolizumab 12/11/2017  Cycle 18 pembrolizumab 01/02/2018  Cycle 19 Pembrolizumab 01/21/2018  Cycle 20 Pembrolizumab 02/13/2018  CT chest 02/27/2018-decreased left paratracheal node, progressive consolidation/fibrosis in the left upper lung, decreased size of splenic mass  Cycle 21 pembrolizumab 03/04/2018  Cycle 22 Pembrolizumab 03/27/2018  Cycle 23 pembrolizumab 04/15/2018  Cycle 24 pembrolizumab 05/08/2018  Cycle 25 Pembrolizumab 05/27/2018  CT chest 06/16/2018- stable left upper lung radiation fibrosis with no evidence of local tumor recurrence.  Decreased left paratracheal adenopathy.  No new or progressive metastatic disease in the chest.  Gastrohepatic ligament lymphadenopathy stable.  Splenic metastasis decreased.  T5 vertebral compression fracture with associated patchy sclerosis and no discrete osseous lesion, new in the interval.  Cycle 26 Pembrolizumab 06/19/2018  2. Acute respiratory failure secondary to #1  3. Oxygen dependent COPD  4. History of a NSTEMI  2015  5. Right lung pneumonia on chest CT 12/12/2016-treated with Levaquin  6.Grade 2 rash possibly related to Pembrolizumab. Treatment held 08/06/2017.Improved 08/29/2017, treatment resumed, rash has resolved  Disposition: Brooke [name] appears stable.  She has completed 25 cycles of pembrolizumab. Recent restaging chest CT shows continued improvement with no new or progressive disease.  Plan continue every 3-week pembrolizumab, cycle 26 today.  She has a compression fracture at T5, new in comparison to the previous CT scan.  Back pain is better.  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  She understands to contact the office with increased pain.  She will return for lab, follow-up in the next cycle of pembrolizumab in approximately 3 weeks.  She will contact the office in the interim as outlined above or with any other problems.  Patient seen with [name].  CT images reviewed on the computer with Brooke [name] at today's visit.  25 minutes were spent face-to-face at today's visit with the majority of that time involved in counseling/coordination of care.    [name] ANP/GNP-BC   06/19/2018  10:42 AM  This was a shared visit with [name].  Brooke [name] was interviewed and examined.  We reviewed the restaging CT images with Brooke [name].  There is no clinical or radiologic evidence of disease progression.  She appears to have a benign T5 compression fracture. The plan is to continue pembrolizumab for at least 2 years unless there is evidence of disease progression.  She is tolerating the pembrolizumab well.  Julieanne Manson, MD

## 2018-06-19 NOTE — Patient Instructions (Signed)
[facility] Discharge Instructions for Patients Receiving Chemotherapy  Today you received the following immunotherapy agent:  Keytruda  To help prevent nausea and vomiting after your treatment, we encourage you to take your nausea medication as prescribed.   If you develop nausea and vomiting that is not controlled by your nausea medication, call the clinic.   BELOW ARE SYMPTOMS THAT SHOULD BE REPORTED IMMEDIATELY:  *FEVER GREATER THAN 100.5 F  *CHILLS WITH OR WITHOUT FEVER  NAUSEA AND VOMITING THAT IS NOT CONTROLLED WITH YOUR NAUSEA MEDICATION  *UNUSUAL SHORTNESS OF BREATH  *UNUSUAL BRUISING OR BLEEDING  TENDERNESS IN MOUTH AND THROAT WITH OR WITHOUT PRESENCE OF ULCERS  *URINARY PROBLEMS  *BOWEL PROBLEMS  UNUSUAL RASH Items with * indicate a potential emergency and should be followed up as soon as possible.  Feel free to call the clinic should you have any questions or concerns. The clinic phone number is [phone].  Please show the [facility] at check-in to the Emergency Department and triage nurse.

## 2018-06-19 NOTE — Telephone Encounter (Signed)
Scheduled appt per 02/13 los.  Printed calendar and avs.

## 2018-06-20 ENCOUNTER — Other Ambulatory Visit: Payer: Self-pay | Admitting: *Deleted

## 2018-06-20 MED ORDER — ATORVASTATIN CALCIUM 20 MG PO TABS: 20.0000 mg | ORAL_TABLET | Freq: Every day | ORAL | 1 refills | Status: DC

## 2018-06-24 IMAGING — CT CT CHEST W/ CM
2 of 3 series · 15 of 36 positions shown, 18 images · IV contrast (ISOVUE 300)
Comparison: 06/20/2017

CLINICAL DATA: Lung cancer.

EXAM:
CT CHEST WITH CONTRAST
TECHNIQUE: Multidetector CT imaging of the chest was performed during
intravenous contrast administration.
CONTRAST:  75mL OMNIPAQUE IOHEXOL 300 MG/ML  SOLN

[Series 2: axial st · axial · 0.60mm/px · z∈[-243,+19]mm · 12 of 155 slices shown, 15 images]
[im 12/155  mediastinal]
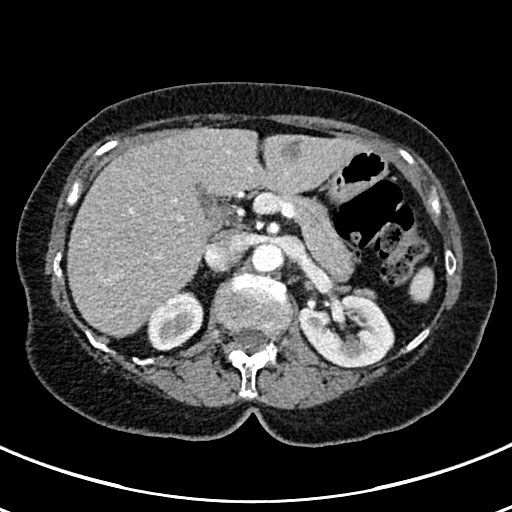
[im 12/155  lung]
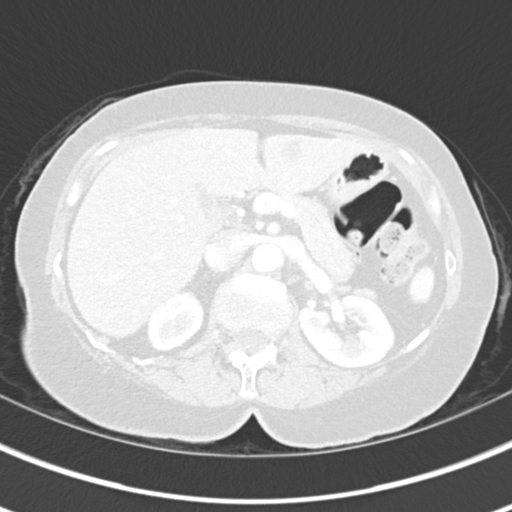
[im 23/155  lung]
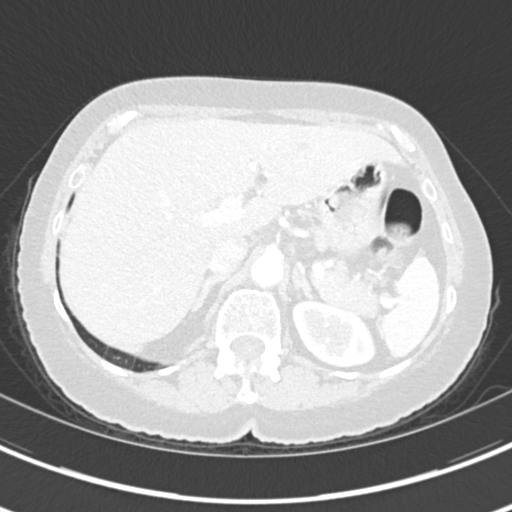
[im 35/155  lung]
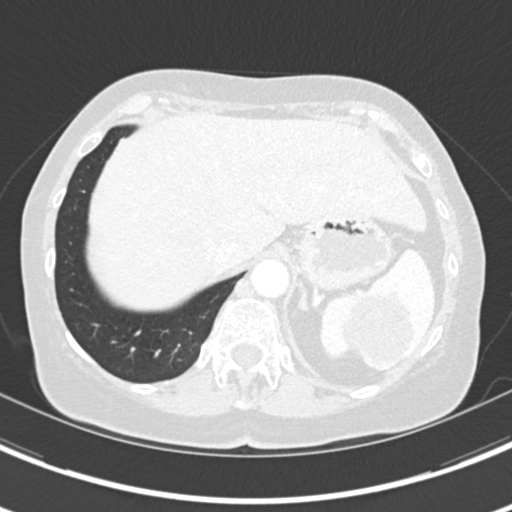
[im 46/155  lung]
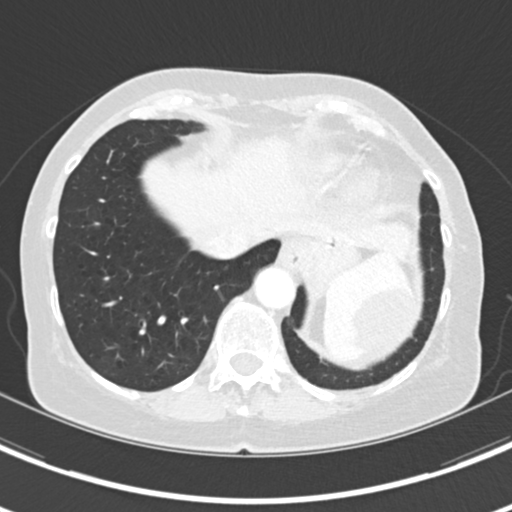
[im 58/155  mediastinal]
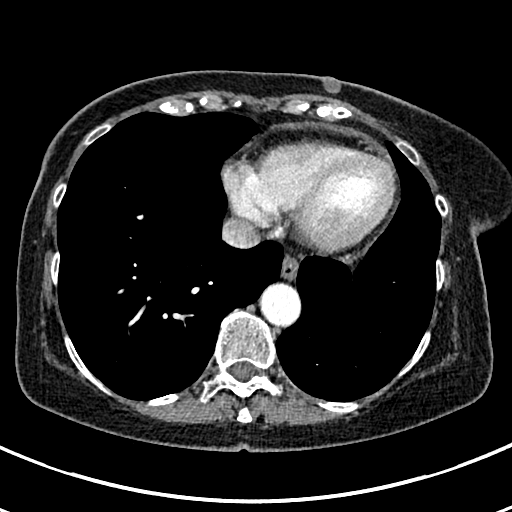
[im 58/155  lung]
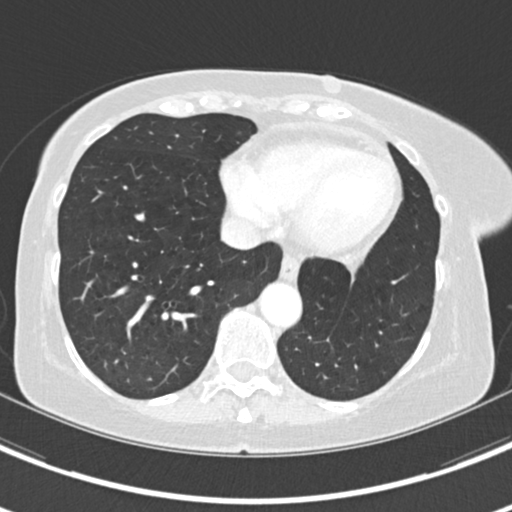
[im 69/155  lung]
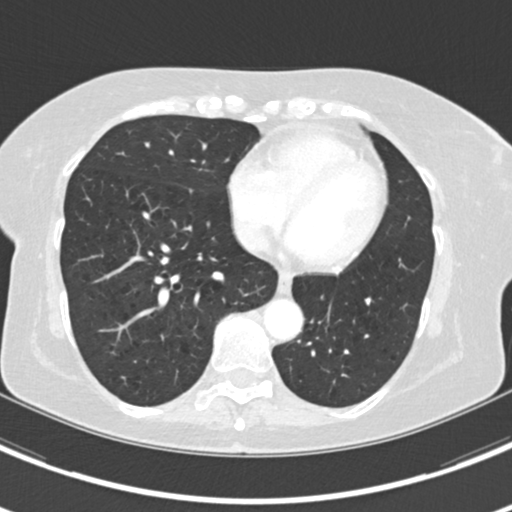
[im 86/155  lung]
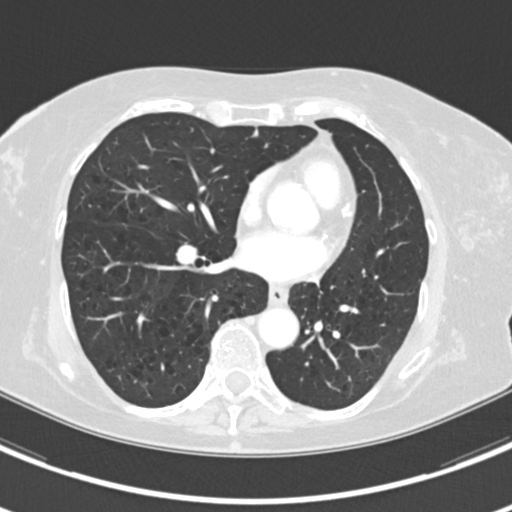
[im 97/155  lung]
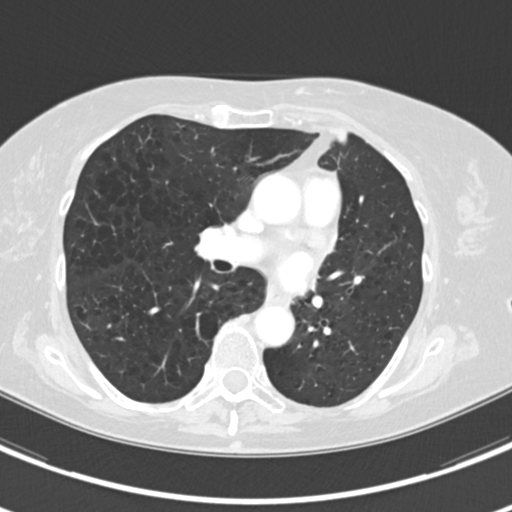
[im 109/155  mediastinal]
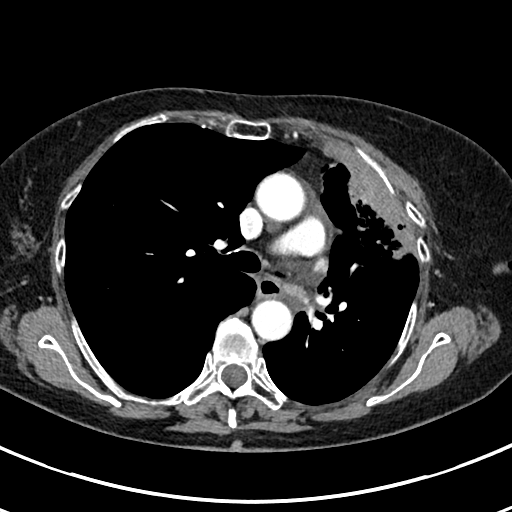
[im 109/155  lung]
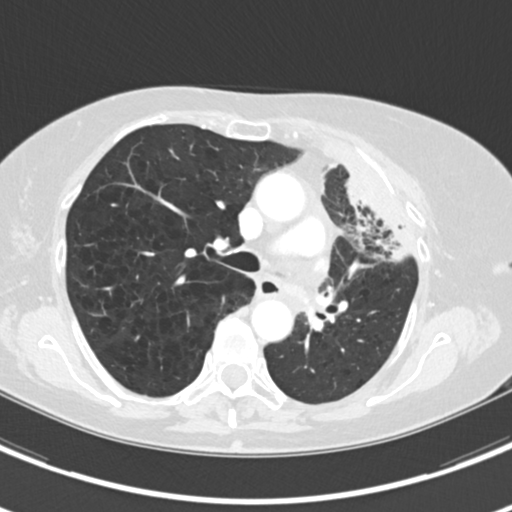
[im 120/155  lung]
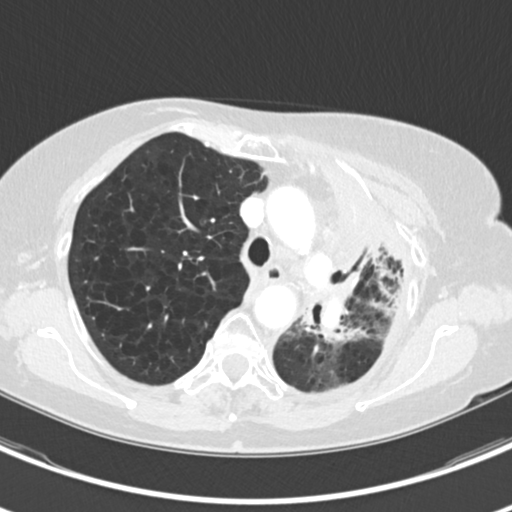
[im 132/155  lung]
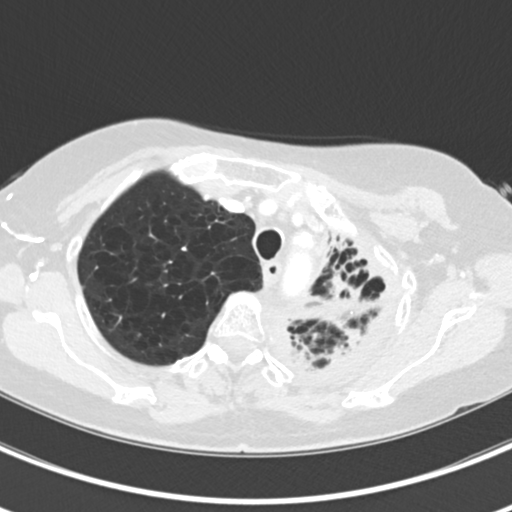
[im 143/155  lung]
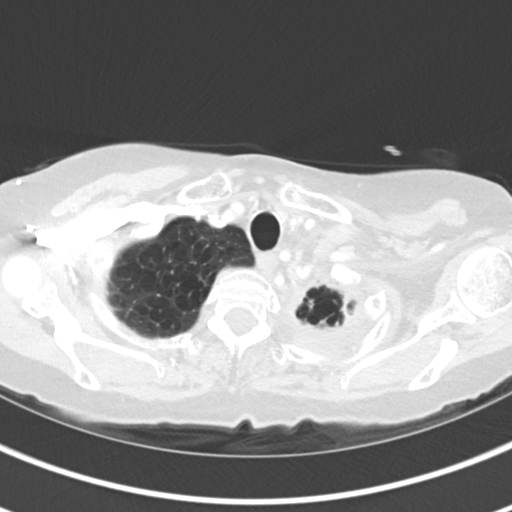

[Series 6: coronal · coronal · 0.63mm/px · 3 of 134 slices shown]
[im 27/134  lung]
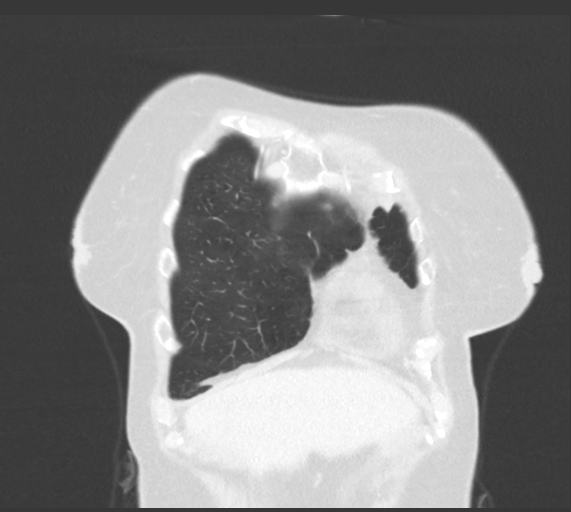
[im 54/134  lung]
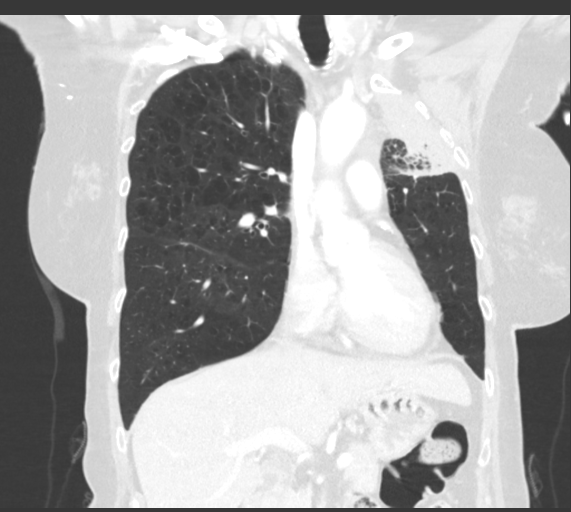
[im 80/134  lung]
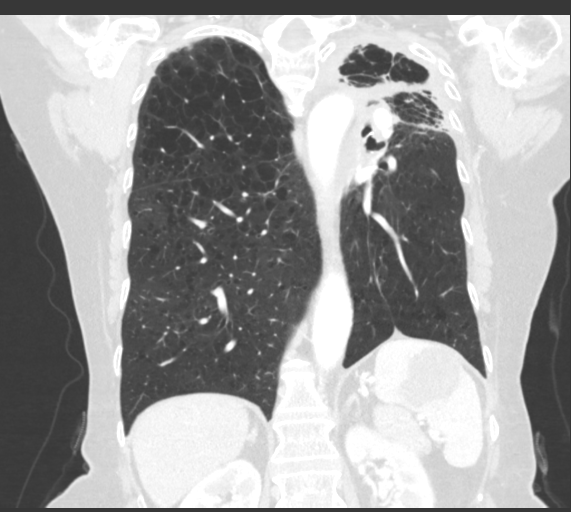

[15 of 36 positions shown; findings below may reference images not displayed]

FINDINGS: Cardiovascular: The heart size is normal. No substantial pericardial
effusion. Coronary artery calcification is evident. Atherosclerotic
calcification is noted in the wall of the thoracic aorta.

Mediastinum/Nodes: Interval progression of AP window lymph node, now
measuring 11 mm short axis (image 39: Series 2) compared to 8 mm
previously. No other mediastinal lymphadenopathy 6 mm short axis
prevascular lymph node ([DATE]) has progressed in the interval. No
right hilar lymphadenopathy. Abnormal bandlike soft tissue
attenuation in the superior left hilum is stable.

Lungs/Pleura: The central tracheobronchial airways are patent.
Centrilobular and paraseptal emphysema again noted. Right lung
clear. Volume loss left hemithorax stable. The left upper lobe
interstitial and airspace disease anteriorly has progressed in the
interval, obscuring the 11 x 30 mm lesion identified on the prior
study (compare [DATE] today to [DATE] previously). No evidence of
pleural effusion.

Upper Abdomen: Cysts in the liver. Splenic metastasis measures 5.2 x
4.3 cm today compared to 5.7 x 4.9 cm previously..

Musculoskeletal: No worrisome lytic or sclerotic osseous
abnormality.
IMPRESSION: 1. Interval progression of interstitial and airspace disease in the
anterior left upper lung. This could be related to evolving post
radiation change, infection, or disease progression. Left upper lobe
lung mass identified previously is now obscured by the background
disease.
2. Interval progression of AP window lymphadenopathy consistent with
metastatic disease.
3. Interval decrease in the splenic metastasis.

## 2018-07-06 ENCOUNTER — Other Ambulatory Visit: Payer: Self-pay | Admitting: Oncology

## 2018-07-08 ENCOUNTER — Inpatient Hospital Stay: Payer: Medicare Other

## 2018-07-08 ENCOUNTER — Inpatient Hospital Stay (HOSPITAL_BASED_OUTPATIENT_CLINIC_OR_DEPARTMENT_OTHER): Payer: Medicare Other | Admitting: Oncology

## 2018-07-08 ENCOUNTER — Telehealth: Payer: Self-pay | Admitting: Oncology

## 2018-07-08 ENCOUNTER — Other Ambulatory Visit: Payer: Self-pay

## 2018-07-08 ENCOUNTER — Inpatient Hospital Stay: Payer: Medicare Other | Attending: Oncology

## 2018-07-08 VITALS — BP 121/66 | HR 85 | Temp 98.0°F | Resp 18 | Ht 63.0 in | Wt 121.2 lb

## 2018-07-08 DIAGNOSIS — Z5112 Encounter for antineoplastic immunotherapy: Secondary | ICD-10-CM | POA: Insufficient documentation

## 2018-07-08 DIAGNOSIS — R634 Abnormal weight loss: Secondary | ICD-10-CM | POA: Diagnosis not present

## 2018-07-08 DIAGNOSIS — C3412 Malignant neoplasm of upper lobe, left bronchus or lung: Secondary | ICD-10-CM

## 2018-07-08 DIAGNOSIS — Z9981 Dependence on supplemental oxygen: Secondary | ICD-10-CM | POA: Insufficient documentation

## 2018-07-08 DIAGNOSIS — R0600 Dyspnea, unspecified: Secondary | ICD-10-CM | POA: Diagnosis not present

## 2018-07-08 DIAGNOSIS — C7889 Secondary malignant neoplasm of other digestive organs: Secondary | ICD-10-CM

## 2018-07-08 DIAGNOSIS — J440 Chronic obstructive pulmonary disease with acute lower respiratory infection: Secondary | ICD-10-CM | POA: Diagnosis not present

## 2018-07-08 DIAGNOSIS — R490 Dysphonia: Secondary | ICD-10-CM | POA: Insufficient documentation

## 2018-07-08 DIAGNOSIS — I252 Old myocardial infarction: Secondary | ICD-10-CM | POA: Diagnosis not present

## 2018-07-08 LAB — CMP (CANCER CENTER ONLY)
ALT: 16 U/L (ref 0–44)
AST: 20 U/L (ref 15–41)
Albumin: 3.8 g/dL (ref 3.5–5.0)
Alkaline Phosphatase: 85 U/L (ref 38–126)
Anion gap: 9 (ref 5–15)
BUN: 12 mg/dL (ref 8–23)
CO2: 25 mmol/L (ref 22–32)
Calcium: 9.3 mg/dL (ref 8.9–10.3)
Chloride: 105 mmol/L (ref 98–111)
Creatinine: 0.68 mg/dL (ref 0.44–1.00)
GFR, Est AFR Am: >60 mL/min (ref >60)
GFR, Estimated: >60 mL/min (ref >60)
Glucose, Bld: 105 mg/dL — ABNORMAL HIGH (ref 70–99)
POTASSIUM: 4.2 mmol/L (ref 3.5–5.1)
Sodium: 139 mmol/L (ref 135–145)
Total Bilirubin: 0.4 mg/dL (ref 0.3–1.2)
Total Protein: 7.2 g/dL (ref 6.5–8.1)

## 2018-07-08 LAB — CBC WITH DIFFERENTIAL (CANCER CENTER ONLY)
Abs Immature Granulocytes: 0.02 10*3/uL (ref 0.00–0.07)
BASOS ABS: 0 10*3/uL (ref 0.0–0.1)
Basophils Relative: 0 %
EOS ABS: 0.2 10*3/uL (ref 0.0–0.5)
Eosinophils Relative: 3 %
HCT: 42.3 % (ref 36.0–46.0)
Hemoglobin: 13.8 g/dL (ref 12.0–15.0)
IMMATURE GRANULOCYTES: 0 %
Lymphocytes Relative: 17 %
Lymphs Abs: 1 10*3/uL (ref 0.7–4.0)
MCH: 28.3 pg (ref 26.0–34.0)
MCHC: 32.6 g/dL (ref 30.0–36.0)
MCV: 86.7 fL (ref 80.0–100.0)
Monocytes Absolute: 0.4 10*3/uL (ref 0.1–1.0)
Monocytes Relative: 7 %
NRBC: 0 % (ref 0.0–0.2)
Neutro Abs: 4 10*3/uL (ref 1.7–7.7)
Neutrophils Relative %: 73 %
Platelet Count: 216 10*3/uL (ref 150–400)
RBC: 4.88 MIL/uL (ref 3.87–5.11)
RDW: 14.6 % (ref 11.5–15.5)
WBC Count: 5.5 10*3/uL (ref 4.0–10.5)

## 2018-07-08 LAB — TSH: TSH: 0.733 u[IU]/mL (ref 0.308–3.960)

## 2018-07-08 MED ORDER — SODIUM CHLORIDE 0.9 % IV SOLN
200.0000 mg | Freq: Once | INTRAVENOUS | Status: AC
  Administered 2018-07-08: 200 mg via INTRAVENOUS
  Filled 2018-07-08: qty 8

## 2018-07-08 MED ORDER — SODIUM CHLORIDE 0.9 % IV SOLN
Freq: Once | INTRAVENOUS | Status: AC
  Administered 2018-07-08: 12:00:00 via INTRAVENOUS
  Filled 2018-07-08: qty 250

## 2018-07-08 NOTE — Patient Instructions (Signed)
[facility] Discharge Instructions for Patients Receiving Chemotherapy  Today you received the following chemotherapy agents:  Keytruda.  To help prevent nausea and vomiting after your treatment, we encourage you to take your nausea medication as directed.   If you develop nausea and vomiting that is not controlled by your nausea medication, call the clinic.   BELOW ARE SYMPTOMS THAT SHOULD BE REPORTED IMMEDIATELY:  *FEVER GREATER THAN 100.5 F  *CHILLS WITH OR WITHOUT FEVER  NAUSEA AND VOMITING THAT IS NOT CONTROLLED WITH YOUR NAUSEA MEDICATION  *UNUSUAL SHORTNESS OF BREATH  *UNUSUAL BRUISING OR BLEEDING  TENDERNESS IN MOUTH AND THROAT WITH OR WITHOUT PRESENCE OF ULCERS  *URINARY PROBLEMS  *BOWEL PROBLEMS  UNUSUAL RASH Items with * indicate a potential emergency and should be followed up as soon as possible.  Feel free to call the clinic should you have any questions or concerns. The clinic phone number is [phone].  Please show the CHEMO ALERT CARD at check-in to the Emergency Department and triage nurse.    

## 2018-07-08 NOTE — Progress Notes (Signed)
[facility] OFFICE PROGRESS NOTE   Diagnosis: Non-small cell lung cancer  INTERVAL HISTORY:   Brooke [name] returns as scheduled.  She completed another treatment with pembrolizumab on 06/19/2018.  No rash or diarrhea.  She reports feeling more "anxious "recently.  She has a good appetite.  She relates weight loss to changing her diet.  She has noted a slight increase in dyspnea.  Objective:  Vital signs in last 24 hours:  Blood pressure 121/66, pulse 85, temperature 98 F (36.7 C), temperature source Oral, resp. rate 18, height '5\' 3"'  (1.6 m), weight 121 lb 3.2 oz (55 kg), SpO2 97 %.    HEENT: No thrush Lymphatics: No cervical or supraclavicular nodes Resp: Lungs distant breath sounds, bronchial sounds at the left upper posterior chest, no respiratory distress Cardio: Regular rate and rhythm GI: No hepatosplenomegaly Vascular: No leg edema  Lab Results:  Lab Results  Component Value Date   WBC 5.5 07/08/2018   HGB 13.8 07/08/2018   HCT 42.3 07/08/2018   MCV 86.7 07/08/2018   PLT 216 07/08/2018   NEUTROABS 4.0 07/08/2018    CMP  Lab Results  Component Value Date   NA 139 06/19/2018   K 4.3 06/19/2018   CL 107 06/19/2018   CO2 27 06/19/2018   GLUCOSE 101 (H) 06/19/2018   BUN 15 06/19/2018   CREATININE 0.68 06/19/2018   CALCIUM 9.4 06/19/2018   PROT 7.2 06/19/2018   ALBUMIN 3.8 06/19/2018   AST 16 06/19/2018   ALT 14 06/19/2018   ALKPHOS 85 06/19/2018   BILITOT 0.4 06/19/2018   GFRNONAA >60 06/19/2018   GFRAA >60 06/19/2018     Medications: I have reviewed the patient's current medications.   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  PDL1 60%  Left lung radiation 11/08/2016 through 11/21/2016  CT chest 12/12/2016-reexpansion of left upper lobe with a decreased left upper lobe mass, unchanged mediastinal adenopathy, progression of a splenic metastasis/pancreatic tail/gastrohepatic ligament metastasis, right lower lobe pneumonia  Cycle 1 Pembrolizumab 12/14/2016  Cycle 2 Pembrolizumab 01/03/2017  Cycle 3 Pembrolizumab 01/24/2017  Cycle 4 Pembrolizumab 02/12/2017  CT 03/05/2018-decrease in left upper lobe mass, mediastinal adenopathy, and splenic mass  Cycle 5 pembrolizumab 03/07/2017  Cycle 6 Pembrolizumab 04/02/2017  Cycle 7 pembrolizumab 04/25/2017  Cycle 8 Pembrolizumab 05/14/2017  Cycle 9 Pembrolizumab 06/06/2017  CT 06/20/2017-mild decrease in left upper lobe mass, mild increase in paramediastinal left upper lobe nodule-radiation change?, decreased splenic metastasis  Cycle 10 pembrolizumab 06/25/2017  Cycle 11 pembrolizumab 07/18/2017  Cycle 12 pembrolizumab 08/29/2017  Cycle 13 pembrolizumab 09/17/2017  Cycle 14 Pembrolizumab 10/10/2017  CT chest 10/24/2017-slight enlargement of small mediastinal lymph nodes, increased left upper lobe consolidation, decreased size of spleen lesion  Cycle 15 pembrolizumab 10/29/2017  Cycle 16 Pembrolizumab 11/21/2017  Cycle 17 Pembrolizumab 12/11/2017  Cycle 18 pembrolizumab 01/02/2018  Cycle 19 Pembrolizumab 01/21/2018  Cycle 20 Pembrolizumab 02/13/2018  CT chest 02/27/2018-decreased left paratracheal node, progressive consolidation/fibrosis in the left upper lung, decreased size of splenic mass  Cycle 21 pembrolizumab 03/04/2018  Cycle 22 Pembrolizumab 03/27/2018  Cycle 23 pembrolizumab 04/15/2018  Cycle 24 pembrolizumab 05/08/2018  Cycle 25 Pembrolizumab 05/27/2018  CT chest 06/16/2018- stable left upper lung radiation fibrosis with no evidence  of local tumor recurrence.  Decreased left paratracheal adenopathy.  No new or progressive metastatic disease in the chest.  Gastrohepatic ligament lymphadenopathy stable.  Splenic metastasis decreased.  T5 vertebral compression fracture with associated patchy sclerosis and no discrete osseous lesion, new in the interval.  Cycle 26 Pembrolizumab 06/19/2018  Cycle 27 pembrolizumab 07/08/2018  2.  History of acute respiratory failure secondary to #1  3. Oxygen dependent COPD  4. History of a NSTEMI 2015  5. Right lung pneumonia on chest CT 12/12/2016-treated with Levaquin  6.Grade 2 rash possibly related to Pembrolizumab. Treatment held 08/06/2017.Improved 08/29/2017, treatment resumed, rash has resolved    Disposition: Brooke [name] appears unchanged.  She will continue pembrolizumab.  We will follow-up on the TSH from today.  She will return for an office visit and pembrolizumab in 3 weeks.  15 minutes were spent with the patient today.  The majority of the time was used for counseling and coordination of care.  Betsy Coder, MD  07/08/2018  10:50 AM

## 2018-07-08 NOTE — Telephone Encounter (Signed)
Gave patient avs report and appointments for March thru May.

## 2018-07-27 ENCOUNTER — Other Ambulatory Visit: Payer: Self-pay | Admitting: Oncology

## 2018-07-30 ENCOUNTER — Other Ambulatory Visit: Payer: Self-pay | Admitting: *Deleted

## 2018-07-30 DIAGNOSIS — C3412 Malignant neoplasm of upper lobe, left bronchus or lung: Secondary | ICD-10-CM

## 2018-07-31 ENCOUNTER — Inpatient Hospital Stay: Payer: Medicare Other

## 2018-07-31 ENCOUNTER — Inpatient Hospital Stay (HOSPITAL_BASED_OUTPATIENT_CLINIC_OR_DEPARTMENT_OTHER): Payer: Medicare Other | Admitting: Oncology

## 2018-07-31 ENCOUNTER — Other Ambulatory Visit: Payer: Self-pay

## 2018-07-31 VITALS — BP 134/61 | HR 85 | Temp 97.9°F | Resp 18 | Ht 63.0 in | Wt 118.5 lb

## 2018-07-31 DIAGNOSIS — R490 Dysphonia: Secondary | ICD-10-CM

## 2018-07-31 DIAGNOSIS — C3412 Malignant neoplasm of upper lobe, left bronchus or lung: Secondary | ICD-10-CM

## 2018-07-31 DIAGNOSIS — Z5112 Encounter for antineoplastic immunotherapy: Secondary | ICD-10-CM | POA: Diagnosis not present

## 2018-07-31 DIAGNOSIS — C7989 Secondary malignant neoplasm of other specified sites: Secondary | ICD-10-CM | POA: Diagnosis not present

## 2018-07-31 LAB — CBC WITH DIFFERENTIAL (CANCER CENTER ONLY)
Abs Immature Granulocytes: 0.02 10*3/uL (ref 0.00–0.07)
Basophils Absolute: 0 10*3/uL (ref 0.0–0.1)
Basophils Relative: 0 %
Eosinophils Absolute: 0.2 10*3/uL (ref 0.0–0.5)
Eosinophils Relative: 3 %
HCT: 45.8 % (ref 36.0–46.0)
Hemoglobin: 14.6 g/dL (ref 12.0–15.0)
Immature Granulocytes: 0 %
Lymphocytes Relative: 16 %
Lymphs Abs: 1.2 10*3/uL (ref 0.7–4.0)
MCH: 28.1 pg (ref 26.0–34.0)
MCHC: 31.9 g/dL (ref 30.0–36.0)
MCV: 88.2 fL (ref 80.0–100.0)
MONOS PCT: 7 %
Monocytes Absolute: 0.5 10*3/uL (ref 0.1–1.0)
NEUTROS PCT: 74 %
Neutro Abs: 5.2 10*3/uL (ref 1.7–7.7)
Platelet Count: 232 10*3/uL (ref 150–400)
RBC: 5.19 MIL/uL — ABNORMAL HIGH (ref 3.87–5.11)
RDW: 14.2 % (ref 11.5–15.5)
WBC Count: 7 10*3/uL (ref 4.0–10.5)
nRBC: 0 % (ref 0.0–0.2)

## 2018-07-31 LAB — CMP (CANCER CENTER ONLY)
ALT: 19 U/L (ref 0–44)
AST: 20 U/L (ref 15–41)
Albumin: 3.9 g/dL (ref 3.5–5.0)
Alkaline Phosphatase: 81 U/L (ref 38–126)
Anion gap: 11 (ref 5–15)
BUN: 14 mg/dL (ref 8–23)
CALCIUM: 9.6 mg/dL (ref 8.9–10.3)
CO2: 24 mmol/L (ref 22–32)
Chloride: 104 mmol/L (ref 98–111)
Creatinine: 0.69 mg/dL (ref 0.44–1.00)
GFR, Est AFR Am: >60 mL/min (ref >60)
GFR, Estimated: >60 mL/min (ref >60)
Glucose, Bld: 102 mg/dL — ABNORMAL HIGH (ref 70–99)
Potassium: 4.2 mmol/L (ref 3.5–5.1)
Sodium: 139 mmol/L (ref 135–145)
Total Bilirubin: 0.4 mg/dL (ref 0.3–1.2)
Total Protein: 7.4 g/dL (ref 6.5–8.1)

## 2018-07-31 MED ORDER — SODIUM CHLORIDE 0.9 % IV SOLN
Freq: Once | INTRAVENOUS | Status: AC
  Administered 2018-07-31: 12:00:00 via INTRAVENOUS
  Filled 2018-07-31: qty 250

## 2018-07-31 MED ORDER — SODIUM CHLORIDE 0.9 % IV SOLN
200.0000 mg | Freq: Once | INTRAVENOUS | Status: AC
  Administered 2018-07-31: 200 mg via INTRAVENOUS
  Filled 2018-07-31: qty 8

## 2018-07-31 NOTE — Progress Notes (Signed)
Patient brought her Medical Advanced Directive/Living Will to office. Made copy and forwarded to HIM for scanning.

## 2018-07-31 NOTE — Patient Instructions (Signed)
[facility] Discharge Instructions for Patients Receiving Chemotherapy  Today you received the following chemotherapy agents:  Keytruda.  To help prevent nausea and vomiting after your treatment, we encourage you to take your nausea medication as directed.   If you develop nausea and vomiting that is not controlled by your nausea medication, call the clinic.   BELOW ARE SYMPTOMS THAT SHOULD BE REPORTED IMMEDIATELY:  *FEVER GREATER THAN 100.5 F  *CHILLS WITH OR WITHOUT FEVER  NAUSEA AND VOMITING THAT IS NOT CONTROLLED WITH YOUR NAUSEA MEDICATION  *UNUSUAL SHORTNESS OF BREATH  *UNUSUAL BRUISING OR BLEEDING  TENDERNESS IN MOUTH AND THROAT WITH OR WITHOUT PRESENCE OF ULCERS  *URINARY PROBLEMS  *BOWEL PROBLEMS  UNUSUAL RASH Items with * indicate a potential emergency and should be followed up as soon as possible.  Feel free to call the clinic should you have any questions or concerns. The clinic phone number is [phone].  Please show the CHEMO ALERT CARD at check-in to the Emergency Department and triage nurse.    

## 2018-07-31 NOTE — Progress Notes (Signed)
[place] OFFICE PROGRESS NOTE   Diagnosis: Non-small cell lung cancer  INTERVAL HISTORY:   Ms. Few returns as scheduled.  She completed another treatment with pembrolizumab on 07/08/2018.  She has noted increased hoarseness.  No fever or dyspnea.  No rash or diarrhea.  Objective:  Vital signs in last 24 hours:  Blood pressure 134/61, pulse 85, temperature 97.9 F (36.6 C), temperature source Oral, resp. rate 18, height '5\' 3"'  (1.6 m), weight 118 lb 8 oz (53.8 kg), SpO2 99 %.      Lab Results:  Lab Results  Component Value Date   WBC 7.0 07/31/2018   HGB 14.6 07/31/2018   HCT 45.8 07/31/2018   MCV 88.2 07/31/2018   PLT 232 07/31/2018   NEUTROABS 5.2 07/31/2018    CMP  Lab Results  Component Value Date   NA 139 07/31/2018   K 4.2 07/31/2018   CL 104 07/31/2018   CO2 24 07/31/2018   GLUCOSE 102 (H) 07/31/2018   BUN 14 07/31/2018   CREATININE 0.69 07/31/2018   CALCIUM 9.6 07/31/2018   PROT 7.4 07/31/2018   ALBUMIN 3.9 07/31/2018   AST 20 07/31/2018   ALT 19 07/31/2018   ALKPHOS 81 07/31/2018   BILITOT 0.4 07/31/2018   GFRNONAA >60 07/31/2018   GFRAA >60 07/31/2018   TSH on 07/08/2018: 0.73 Medications: I have reviewed the patient's current medications.   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  PDL1 60%  Left lung radiation 11/08/2016 through 11/21/2016  CT chest 12/12/2016-reexpansion of left upper lobe with a decreased left upper lobe mass, unchanged mediastinal adenopathy, progression of a 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, right lower lobe pneumonia  Cycle 1 Pembrolizumab 12/14/2016  Cycle 2 Pembrolizumab 01/03/2017  Cycle 3 Pembrolizumab 01/24/2017  Cycle 4 Pembrolizumab 02/12/2017  CT 03/05/2018-decrease in left upper lobe mass, mediastinal adenopathy, and splenic mass  Cycle 5 pembrolizumab 03/07/2017  Cycle 6 Pembrolizumab 04/02/2017  Cycle 7 pembrolizumab 04/25/2017  Cycle 8 Pembrolizumab 05/14/2017  Cycle 9 Pembrolizumab 06/06/2017  CT 06/20/2017-mild decrease in left upper lobe mass, mild increase in paramediastinal left upper lobe nodule-radiation change?, decreased splenic metastasis  Cycle 10 pembrolizumab 06/25/2017  Cycle 11 pembrolizumab 07/18/2017  Cycle 12 pembrolizumab 08/29/2017  Cycle 13 pembrolizumab 09/17/2017  Cycle 14 Pembrolizumab 10/10/2017  CT chest 10/24/2017-slight enlargement of small mediastinal lymph nodes, increased left upper lobe consolidation, decreased size of spleen lesion  Cycle 15 pembrolizumab 10/29/2017  Cycle 16 Pembrolizumab 11/21/2017  Cycle 17 Pembrolizumab 12/11/2017  Cycle 18 pembrolizumab 01/02/2018  Cycle 19 Pembrolizumab 01/21/2018  Cycle 20 Pembrolizumab 02/13/2018  CT chest 02/27/2018-decreased left paratracheal node, progressive consolidation/fibrosis in the left upper lung, decreased size of splenic mass  Cycle 21 pembrolizumab 03/04/2018  Cycle 22 Pembrolizumab 03/27/2018  Cycle 23 pembrolizumab 04/15/2018  Cycle 24 pembrolizumab 05/08/2018  Cycle 25 Pembrolizumab 05/27/2018  CT chest 06/16/2018- stable left upper lung radiation fibrosis with no evidence of local tumor recurrence.  Decreased left paratracheal adenopathy.  No new or progressive metastatic disease in the chest.  Gastrohepatic ligament lymphadenopathy stable.  Splenic metastasis decreased.  T5 vertebral compression fracture with associated patchy sclerosis and no discrete osseous lesion,  new in the interval.   Cycle 26 Pembrolizumab 06/19/2018  Cycle 27 pembrolizumab 07/08/2018  Cycle 28 pembrolizumab 07/31/2018  2.  History of acute respiratory failure secondary to #1  3. Oxygen dependent COPD  4. History of a NSTEMI 2015  5. Right lung pneumonia on chest CT 12/12/2016-treated with Levaquin  6.Grade 2 rash possibly related to Pembrolizumab. Treatment held 08/06/2017.Improved 08/29/2017, treatment resumed, rash has resolved      Disposition: Ms. Lindahl appears unchanged.  She will complete another treatment with pembrolizumab today.  She will return for an office visit and pembrolizumab in 3 weeks. The plan is to schedule a restaging CT at an approximate 4-monthinterval.  15 minutes were spent with the patient today.  The majority of the time was used for counseling and coordination of care.  GBetsy Coder MD  07/31/2018  11:02 AM

## 2018-08-17 ENCOUNTER — Other Ambulatory Visit: Payer: Self-pay | Admitting: Oncology

## 2018-08-19 ENCOUNTER — Other Ambulatory Visit: Payer: Self-pay

## 2018-08-19 ENCOUNTER — Inpatient Hospital Stay: Payer: Medicare Other

## 2018-08-19 ENCOUNTER — Inpatient Hospital Stay (HOSPITAL_BASED_OUTPATIENT_CLINIC_OR_DEPARTMENT_OTHER): Payer: Medicare Other | Admitting: Oncology

## 2018-08-19 ENCOUNTER — Inpatient Hospital Stay: Payer: Medicare Other | Attending: Oncology

## 2018-08-19 ENCOUNTER — Telehealth: Payer: Self-pay | Admitting: Oncology

## 2018-08-19 VITALS — BP 150/83 | HR 81 | Temp 98.3°F | Resp 18 | Ht 63.0 in | Wt 118.8 lb

## 2018-08-19 DIAGNOSIS — C3412 Malignant neoplasm of upper lobe, left bronchus or lung: Secondary | ICD-10-CM

## 2018-08-19 DIAGNOSIS — J440 Chronic obstructive pulmonary disease with acute lower respiratory infection: Secondary | ICD-10-CM

## 2018-08-19 DIAGNOSIS — C7889 Secondary malignant neoplasm of other digestive organs: Secondary | ICD-10-CM | POA: Insufficient documentation

## 2018-08-19 DIAGNOSIS — Z9981 Dependence on supplemental oxygen: Secondary | ICD-10-CM | POA: Insufficient documentation

## 2018-08-19 DIAGNOSIS — I252 Old myocardial infarction: Secondary | ICD-10-CM

## 2018-08-19 DIAGNOSIS — Z5112 Encounter for antineoplastic immunotherapy: Secondary | ICD-10-CM | POA: Insufficient documentation

## 2018-08-19 LAB — CMP (CANCER CENTER ONLY)
ALT: 16 U/L (ref 0–44)
AST: 17 U/L (ref 15–41)
Albumin: 3.7 g/dL (ref 3.5–5.0)
Alkaline Phosphatase: 86 U/L (ref 38–126)
Anion gap: 11 (ref 5–15)
BUN: 11 mg/dL (ref 8–23)
CO2: 24 mmol/L (ref 22–32)
Calcium: 9.3 mg/dL (ref 8.9–10.3)
Chloride: 105 mmol/L (ref 98–111)
Creatinine: 0.66 mg/dL (ref 0.44–1.00)
GFR, Est AFR Am: >60 mL/min (ref >60)
GFR, Estimated: >60 mL/min (ref >60)
Glucose, Bld: 85 mg/dL (ref 70–99)
Potassium: 4 mmol/L (ref 3.5–5.1)
Sodium: 140 mmol/L (ref 135–145)
Total Bilirubin: 0.3 mg/dL (ref 0.3–1.2)
Total Protein: 7.2 g/dL (ref 6.5–8.1)

## 2018-08-19 MED ORDER — SODIUM CHLORIDE 0.9 % IV SOLN
Freq: Once | INTRAVENOUS | Status: AC
  Administered 2018-08-19: 12:00:00 via INTRAVENOUS
  Filled 2018-08-19: qty 250

## 2018-08-19 MED ORDER — SODIUM CHLORIDE 0.9 % IV SOLN
200.0000 mg | Freq: Once | INTRAVENOUS | Status: AC
  Administered 2018-08-19: 200 mg via INTRAVENOUS
  Filled 2018-08-19: qty 8

## 2018-08-19 NOTE — Telephone Encounter (Signed)
Scheduled appt per 4/14 los.

## 2018-08-19 NOTE — Progress Notes (Signed)
[place] OFFICE PROGRESS NOTE   Diagnosis: Non-small cell lung cancer  INTERVAL HISTORY:   Ms. Gaber completed another treatment with pembrolizumab on 07/31/2018.  She feels well.  No rash or diarrhea.  She has seasonal allergies.  Objective:  Vital signs in last 24 hours:  Blood pressure (!) 150/83, pulse 81, temperature 98.3 F (36.8 C), temperature source Oral, resp. rate 18, height '5\' 3"'  (1.6 m), weight 118 lb 12.8 oz (53.9 kg), SpO2 100 %.   Physical examination-not performed today  Lab Results:  Lab Results  Component Value Date   WBC 7.0 07/31/2018   HGB 14.6 07/31/2018   HCT 45.8 07/31/2018   MCV 88.2 07/31/2018   PLT 232 07/31/2018   NEUTROABS 5.2 07/31/2018    CMP  Lab Results  Component Value Date   NA 140 08/19/2018   K 4.0 08/19/2018   CL 105 08/19/2018   CO2 24 08/19/2018   GLUCOSE 85 08/19/2018   BUN 11 08/19/2018   CREATININE 0.66 08/19/2018   CALCIUM 9.3 08/19/2018   PROT 7.2 08/19/2018   ALBUMIN 3.7 08/19/2018   AST 17 08/19/2018   ALT 16 08/19/2018   ALKPHOS 86 08/19/2018   BILITOT 0.3 08/19/2018   GFRNONAA >60 08/19/2018   GFRAA >60 08/19/2018    Medications: I have reviewed the patient's current medications.   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  PDL1 60%  Left lung radiation 11/08/2016 through 11/21/2016  CT chest 12/12/2016-reexpansion of left upper lobe with a decreased left upper lobe mass, unchanged mediastinal adenopathy, progression of a splenic metastasis/pancreatic  tail/gastrohepatic ligament metastasis, right lower lobe pneumonia  Cycle 1 Pembrolizumab 12/14/2016  Cycle 2 Pembrolizumab 01/03/2017  Cycle 3 Pembrolizumab 01/24/2017  Cycle 4 Pembrolizumab 02/12/2017  CT 03/05/2018-decrease in left upper lobe mass, mediastinal adenopathy, and splenic mass  Cycle 5 pembrolizumab 03/07/2017  Cycle 6 Pembrolizumab 04/02/2017  Cycle 7 pembrolizumab 04/25/2017  Cycle 8 Pembrolizumab 05/14/2017  Cycle 9 Pembrolizumab 06/06/2017  CT 06/20/2017-mild decrease in left upper lobe mass, mild increase in paramediastinal left upper lobe nodule-radiation change?, decreased splenic metastasis  Cycle 10 pembrolizumab 06/25/2017  Cycle 11 pembrolizumab 07/18/2017  Cycle 12 pembrolizumab 08/29/2017  Cycle 13 pembrolizumab 09/17/2017  Cycle 14 Pembrolizumab 10/10/2017  CT chest 10/24/2017-slight enlargement of small mediastinal lymph nodes, increased left upper lobe consolidation, decreased size of spleen lesion  Cycle 15 pembrolizumab 10/29/2017  Cycle 16 Pembrolizumab 11/21/2017  Cycle 17 Pembrolizumab 12/11/2017  Cycle 18 pembrolizumab 01/02/2018  Cycle 19 Pembrolizumab 01/21/2018  Cycle 20 Pembrolizumab 02/13/2018  CT chest 02/27/2018-decreased left paratracheal node, progressive consolidation/fibrosis in the left upper lung, decreased size of splenic mass  Cycle 21 pembrolizumab 03/04/2018  Cycle 22 Pembrolizumab 03/27/2018  Cycle 23 pembrolizumab 04/15/2018  Cycle 24 pembrolizumab 05/08/2018  Cycle 25 Pembrolizumab 05/27/2018  CT chest 06/16/2018- stable left upper lung radiation fibrosis with no evidence of local tumor recurrence.  Decreased left paratracheal adenopathy.  No new or progressive metastatic disease in the chest.  Gastrohepatic ligament lymphadenopathy stable.  Splenic metastasis decreased.  T5 vertebral compression fracture with associated patchy sclerosis and no discrete osseous lesion, new in the interval.  Cycle 26 Pembrolizumab  06/19/2018  Cycle 27 pembrolizumab 07/08/2018  Cycle 28 pembrolizumab 07/31/2018  Cycle 29 pembrolizumab 08/19/2018  2.  History of acute respiratory failure secondary to #1  3. Oxygen dependent COPD  4. History of a NSTEMI 2015  5. Right lung pneumonia on chest CT 12/12/2016-treated with Levaquin  6.Grade 2 rash possibly related to Pembrolizumab. Treatment held 08/06/2017.Improved 08/29/2017, treatment resumed, rash has resolved     Disposition: Ms. Grieshop appears stable.  There is no clinical evidence for progression of the lung cancer.  She will continue pembrolizumab.  She is tolerating the pembrolizumab well.  We will check the TSH next month.  She will be scheduled for office visit and pembrolizumab in 3 weeks.  Betsy Coder, MD  08/19/2018  10:43 AM

## 2018-08-19 NOTE — Patient Instructions (Addendum)
Coronavirus (COVID-19) Are you at risk?  Are you at risk for the Coronavirus (COVID-19)?  To be considered HIGH RISK for Coronavirus (COVID-19), you have to meet the following criteria:  . Traveled to [place], [place], [place], [place] or [place]; or in the [place] to [place], [place], [place], or [place]; and have fever, cough, and shortness of breath within the last 2 weeks of travel OR . Been in close contact with a person diagnosed with COVID-19 within the last 2 weeks and have fever, cough, and shortness of breath . IF YOU DO NOT MEET THESE CRITERIA, YOU ARE CONSIDERED LOW RISK FOR COVID-19.  What to do if you are HIGH RISK for COVID-19?  . If you are having a medical emergency, call 911. . Seek medical care right away. Before you go to a doctor's office, urgent care or emergency department, call ahead and tell them about your recent travel, contact with someone diagnosed with COVID-19, and your symptoms. You should receive instructions from your physician's office regarding next steps of care.  . When you arrive at healthcare provider, tell the healthcare staff immediately you have returned from visiting [place], [place], [place], [place] or [place]; or traveled in the [place] to [place], [place], [place], or [place]; in the last two weeks or you have been in close contact with a person diagnosed with COVID-19 in the last 2 weeks.   . Tell the health care staff about your symptoms: fever, cough and shortness of breath. . After you have been seen by a medical provider, you will be either: o Tested for (COVID-19) and discharged home on quarantine except to seek medical care if symptoms worsen, and asked to  - Stay home and avoid contact with others until you get your results (4-5 days)  - Avoid travel on public transportation if possible (such as bus, train, or airplane) or o Sent to the Emergency Department by EMS for evaluation, COVID-19 testing, and possible  admission depending on your condition and test results.  What to do if you are LOW RISK for COVID-19?  Reduce your risk of any infection by using the same precautions used for avoiding the common cold or flu:  . Wash your hands often with soap and warm water for at least 20 seconds.  If soap and water are not readily available, use an alcohol-based hand sanitizer with at least 60% alcohol.  . If coughing or sneezing, cover your mouth and nose by coughing or sneezing into the elbow areas of your shirt or coat, into a tissue or into your sleeve (not your hands). . Avoid shaking hands with others and consider head nods or verbal greetings only. . Avoid touching your eyes, nose, or mouth with unwashed hands.  . Avoid close contact with people who are sick. . Avoid places or events with large numbers of people in one location, like concerts or sporting events. . Carefully consider travel plans you have or are making. . If you are planning any travel outside or inside the [place], visit the CDC's Travelers' Health webpage for the latest health notices. . If you have some symptoms but not all symptoms, continue to monitor at home and seek medical attention if your symptoms worsen. . If you are having a medical emergency, call 911.   ADDITIONAL HEALTHCARE OPTIONS FOR PATIENTS  [facility] / e-Visit: [email or web address]         [facility]: [phone]  [facility]: [phone]                   [facility]: [phone]   [facility] Discharge Instructions for Patients Receiving Chemotherapy  Today you received the following chemotherapy agents Keytruda  To help prevent nausea and vomiting after your treatment, we encourage you to take your nausea medication as directed.    If you develop nausea and vomiting that is not controlled by your nausea medication, call the clinic.   BELOW ARE  SYMPTOMS THAT SHOULD BE REPORTED IMMEDIATELY:  *FEVER GREATER THAN 100.5 F  *CHILLS WITH OR WITHOUT FEVER  NAUSEA AND VOMITING THAT IS NOT CONTROLLED WITH YOUR NAUSEA MEDICATION  *UNUSUAL SHORTNESS OF BREATH  *UNUSUAL BRUISING OR BLEEDING  TENDERNESS IN MOUTH AND THROAT WITH OR WITHOUT PRESENCE OF ULCERS  *URINARY PROBLEMS  *BOWEL PROBLEMS  UNUSUAL RASH Items with * indicate a potential emergency and should be followed up as soon as possible.  Feel free to call the clinic should you have any questions or concerns. The clinic phone number is [phone].  Please show the CHEMO ALERT CARD at check-in to the Emergency Department and triage nurse.   

## 2018-08-19 NOTE — Progress Notes (Signed)
Per [name]: No CBC needed to treat w/Keytruda. Has been stable for over 5 months.

## 2018-09-07 ENCOUNTER — Other Ambulatory Visit: Payer: Self-pay | Admitting: Oncology

## 2018-09-11 ENCOUNTER — Telehealth: Payer: Self-pay | Admitting: Nurse Practitioner

## 2018-09-11 ENCOUNTER — Inpatient Hospital Stay: Payer: Medicare Other

## 2018-09-11 ENCOUNTER — Other Ambulatory Visit: Payer: Self-pay

## 2018-09-11 ENCOUNTER — Encounter: Payer: Self-pay | Admitting: Nurse Practitioner

## 2018-09-11 ENCOUNTER — Inpatient Hospital Stay: Payer: Medicare Other | Attending: Oncology | Admitting: Nurse Practitioner

## 2018-09-11 VITALS — BP 142/84 | HR 83 | Temp 98.0°F | Resp 18 | Ht 63.0 in | Wt 116.2 lb

## 2018-09-11 DIAGNOSIS — C3412 Malignant neoplasm of upper lobe, left bronchus or lung: Secondary | ICD-10-CM | POA: Insufficient documentation

## 2018-09-11 DIAGNOSIS — C7889 Secondary malignant neoplasm of other digestive organs: Secondary | ICD-10-CM | POA: Diagnosis not present

## 2018-09-11 DIAGNOSIS — Z79899 Other long term (current) drug therapy: Secondary | ICD-10-CM | POA: Diagnosis not present

## 2018-09-11 DIAGNOSIS — I252 Old myocardial infarction: Secondary | ICD-10-CM | POA: Insufficient documentation

## 2018-09-11 DIAGNOSIS — R634 Abnormal weight loss: Secondary | ICD-10-CM | POA: Diagnosis not present

## 2018-09-11 DIAGNOSIS — Z5112 Encounter for antineoplastic immunotherapy: Secondary | ICD-10-CM | POA: Diagnosis not present

## 2018-09-11 DIAGNOSIS — J440 Chronic obstructive pulmonary disease with acute lower respiratory infection: Secondary | ICD-10-CM | POA: Diagnosis not present

## 2018-09-11 DIAGNOSIS — Z9981 Dependence on supplemental oxygen: Secondary | ICD-10-CM | POA: Diagnosis not present

## 2018-09-11 MED ORDER — SODIUM CHLORIDE 0.9 % IV SOLN
200.0000 mg | Freq: Once | INTRAVENOUS | Status: AC
  Administered 2018-09-11: 12:00:00 200 mg via INTRAVENOUS
  Filled 2018-09-11: qty 8

## 2018-09-11 MED ORDER — SODIUM CHLORIDE 0.9 % IV SOLN
Freq: Once | INTRAVENOUS | Status: AC
  Administered 2018-09-11: 12:00:00 via INTRAVENOUS
  Filled 2018-09-11: qty 250

## 2018-09-11 NOTE — Progress Notes (Signed)
Per [name], NP, okay to treat, patient does not need labs today.

## 2018-09-11 NOTE — Progress Notes (Signed)
[facility] OFFICE PROGRESS NOTE   Diagnosis: Non-small cell lung cancer  INTERVAL HISTORY:   [name] returns as scheduled.  She completed another cycle of pembrolizumab 08/19/2018.  She denies nausea/vomiting.  No mouth sores.  No diarrhea.  No rash.  She has noted some increase in "phlegm".  She wonders if this is related to allergies.  Mucinex helps.  No change in baseline dyspnea.  No fever.  Objective:  Vital signs in last 24 hours:  Blood pressure (!) 142/84, pulse 83, temperature 98 F (36.7 C), temperature source Oral, resp. rate 18, height '5\' 3"'  (1.6 m), weight 116 lb 3.2 oz (52.7 kg), SpO2 99 %.    Skin: No rash.   Lab Results:  Lab Results  Component Value Date   WBC 7.0 07/31/2018   HGB 14.6 07/31/2018   HCT 45.8 07/31/2018   MCV 88.2 07/31/2018   PLT 232 07/31/2018   NEUTROABS 5.2 07/31/2018    Imaging:  No results found.  Medications: I have reviewed the patient's current medications.  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  PDL1 60%  Left lung radiation 11/08/2016 through 11/21/2016  CT chest 12/12/2016-reexpansion of left upper lobe with a decreased left upper lobe mass, unchanged mediastinal adenopathy, progression of a splenic metastasis/pancreatic tail/gastrohepatic ligament metastasis, right lower lobe pneumonia  Cycle 1 Pembrolizumab 12/14/2016  Cycle 2 Pembrolizumab 01/03/2017  Cycle 3 Pembrolizumab 01/24/2017  Cycle 4 Pembrolizumab 02/12/2017  CT 03/05/2018-decrease in left upper lobe mass,  mediastinal adenopathy, and splenic mass  Cycle 5 pembrolizumab 03/07/2017  Cycle 6 Pembrolizumab 04/02/2017  Cycle 7 pembrolizumab 04/25/2017  Cycle 8 Pembrolizumab 05/14/2017  Cycle 9 Pembrolizumab 06/06/2017  CT 06/20/2017-mild decrease in left upper lobe mass, mild increase in paramediastinal left upper lobe nodule-radiation change?, decreased splenic metastasis  Cycle 10 pembrolizumab 06/25/2017  Cycle 11 pembrolizumab 07/18/2017  Cycle 12 pembrolizumab 08/29/2017  Cycle 13 pembrolizumab 09/17/2017  Cycle 14 Pembrolizumab 10/10/2017  CT chest 10/24/2017-slight enlargement of small mediastinal lymph nodes, increased left upper lobe consolidation, decreased size of spleen lesion  Cycle 15 pembrolizumab 10/29/2017  Cycle 16 Pembrolizumab 11/21/2017  Cycle 17 Pembrolizumab 12/11/2017  Cycle 18 pembrolizumab 01/02/2018  Cycle 19 Pembrolizumab 01/21/2018  Cycle 20 Pembrolizumab 02/13/2018  CT chest 02/27/2018-decreased left paratracheal node, progressive consolidation/fibrosis in the left upper lung, decreased size of splenic mass  Cycle 21 pembrolizumab 03/04/2018  Cycle 22 Pembrolizumab 03/27/2018  Cycle 23 pembrolizumab 04/15/2018  Cycle 24 pembrolizumab 05/08/2018  Cycle 25 Pembrolizumab 05/27/2018  CT chest 06/16/2018- stable left upper lung radiation fibrosis with no evidence of local tumor recurrence.  Decreased left paratracheal adenopathy.  No new or progressive metastatic disease in the chest.  Gastrohepatic ligament lymphadenopathy stable.  Splenic metastasis decreased.  T5 vertebral compression fracture with associated patchy sclerosis and no discrete osseous lesion, new in the interval.  Cycle 26 Pembrolizumab 06/19/2018  Cycle 27 pembrolizumab 07/08/2018  Cycle 28 pembrolizumab 07/31/2018  Cycle 29 pembrolizumab 08/19/2018  Cycle 30 Pembrolizumab 09/11/2018  2.  History of acute respiratory failure secondary to #1  3. Oxygen dependent COPD  4. History of a  NSTEMI 2015  5. Right lung pneumonia on chest  CT 12/12/2016-treated with Levaquin  6.Grade 2 rash possibly related to Pembrolizumab. Treatment held 08/06/2017.Improved 08/29/2017, treatment resumed, rash has resolved  Disposition: Brooke [name] appears stable.  There is no clinical evidence of disease progression.  Plan to continue every 3-week pembrolizumab.  She will return for lab, follow-up and pembrolizumab in 3 weeks.  She will contact the office in the interim with any problems.    [name] ANP/GNP-BC   09/11/2018  10:29 AM

## 2018-09-11 NOTE — Patient Instructions (Signed)
[facility] Discharge Instructions for Patients Receiving Chemotherapy  Today you received the following chemotherapy agents Pembrolizumab (KEYTRUDA).  To help prevent nausea and vomiting after your treatment, we encourage you to take your nausea medication as prescribed.   If you develop nausea and vomiting that is not controlled by your nausea medication, call the clinic.   BELOW ARE SYMPTOMS THAT SHOULD BE REPORTED IMMEDIATELY:  *FEVER GREATER THAN 100.5 F  *CHILLS WITH OR WITHOUT FEVER  NAUSEA AND VOMITING THAT IS NOT CONTROLLED WITH YOUR NAUSEA MEDICATION  *UNUSUAL SHORTNESS OF BREATH  *UNUSUAL BRUISING OR BLEEDING  TENDERNESS IN MOUTH AND THROAT WITH OR WITHOUT PRESENCE OF ULCERS  *URINARY PROBLEMS  *BOWEL PROBLEMS  UNUSUAL RASH Items with * indicate a potential emergency and should be followed up as soon as possible.  Feel free to call the clinic should you have any questions or concerns. The clinic phone number is [phone].  Please show the CHEMO ALERT CARD at check-in to the Emergency Department and triage nurse.  Coronavirus (COVID-19) Are you at risk?  Are you at risk for the Coronavirus (COVID-19)?  To be considered HIGH RISK for Coronavirus (COVID-19), you have to meet the following criteria:  . Traveled to [place], [place], [place], [place] or [place]; or in the [place] to [place], [place], [place], or [place]; and have fever, cough, and shortness of breath within the last 2 weeks of travel OR . Been in close contact with a person diagnosed with COVID-19 within the last 2 weeks and have fever, cough, and shortness of breath . IF YOU DO NOT MEET THESE CRITERIA, YOU ARE CONSIDERED LOW RISK FOR COVID-19.  What to do if you are HIGH RISK for COVID-19?  . If you are having a medical emergency, call 911. . Seek medical care right away. Before you go to a doctor's office, urgent care or emergency department, call ahead and tell  them about your recent travel, contact with someone diagnosed with COVID-19, and your symptoms. You should receive instructions from your physician's office regarding next steps of care.  . When you arrive at healthcare provider, tell the healthcare staff immediately you have returned from visiting [place], [place], [place], [place] or [place]; or traveled in the [place] to [place], [place], [place], or [place]; in the last two weeks or you have been in close contact with a person diagnosed with COVID-19 in the last 2 weeks.   . Tell the health care staff about your symptoms: fever, cough and shortness of breath. . After you have been seen by a medical provider, you will be either: o Tested for (COVID-19) and discharged home on quarantine except to seek medical care if symptoms worsen, and asked to  - Stay home and avoid contact with others until you get your results (4-5 days)  - Avoid travel on public transportation if possible (such as bus, train, or airplane) or o Sent to the Emergency Department by EMS for evaluation, COVID-19 testing, and possible admission depending on your condition and test results.  What to do if you are LOW RISK for COVID-19?  Reduce your risk of any infection by using the same precautions used for avoiding the common cold or flu:  . Wash your hands often with soap and warm water for at least 20 seconds.  If soap and water are not readily available, use an alcohol-based hand sanitizer with at least 60% alcohol.  . If coughing or   sneezing, cover your mouth and nose by coughing or sneezing into the elbow areas of your shirt or coat, into a tissue or into your sleeve (not your hands). . Avoid shaking hands with others and consider head nods or verbal greetings only. . Avoid touching your eyes, nose, or mouth with unwashed hands.  . Avoid close contact with people who are sick. . Avoid places or events with large numbers of people in one location, like concerts or  sporting events. . Carefully consider travel plans you have or are making. . If you are planning any travel outside or inside the [place], visit the CDC's Travelers' Health webpage for the latest health notices. . If you have some symptoms but not all symptoms, continue to monitor at home and seek medical attention if your symptoms worsen. . If you are having a medical emergency, call 911.   ADDITIONAL HEALTHCARE OPTIONS FOR PATIENTS  [facility] / e-Visit: [email or web address]         [facility]: [phone]  [facility]: [phone]                   [facility]: [phone]    

## 2018-09-11 NOTE — Telephone Encounter (Signed)
Scheduled appt per 5/7 los.

## 2018-09-19 ENCOUNTER — Ambulatory Visit: Payer: Medicare Other | Admitting: Internal Medicine

## 2018-09-29 ENCOUNTER — Other Ambulatory Visit: Payer: Self-pay | Admitting: Oncology

## 2018-09-30 ENCOUNTER — Inpatient Hospital Stay (HOSPITAL_BASED_OUTPATIENT_CLINIC_OR_DEPARTMENT_OTHER): Payer: Medicare Other | Admitting: Oncology

## 2018-09-30 ENCOUNTER — Other Ambulatory Visit: Payer: Self-pay

## 2018-09-30 ENCOUNTER — Inpatient Hospital Stay: Payer: Medicare Other

## 2018-09-30 VITALS — BP 130/86 | HR 82 | Temp 97.3°F | Resp 17 | Ht 63.0 in | Wt 114.5 lb

## 2018-09-30 DIAGNOSIS — R634 Abnormal weight loss: Secondary | ICD-10-CM

## 2018-09-30 DIAGNOSIS — C3412 Malignant neoplasm of upper lobe, left bronchus or lung: Secondary | ICD-10-CM

## 2018-09-30 DIAGNOSIS — J440 Chronic obstructive pulmonary disease with acute lower respiratory infection: Secondary | ICD-10-CM | POA: Diagnosis not present

## 2018-09-30 DIAGNOSIS — C7889 Secondary malignant neoplasm of other digestive organs: Secondary | ICD-10-CM | POA: Diagnosis not present

## 2018-09-30 DIAGNOSIS — Z79899 Other long term (current) drug therapy: Secondary | ICD-10-CM

## 2018-09-30 DIAGNOSIS — Z5112 Encounter for antineoplastic immunotherapy: Secondary | ICD-10-CM | POA: Diagnosis not present

## 2018-09-30 DIAGNOSIS — Z9981 Dependence on supplemental oxygen: Secondary | ICD-10-CM

## 2018-09-30 DIAGNOSIS — I252 Old myocardial infarction: Secondary | ICD-10-CM

## 2018-09-30 LAB — CBC WITH DIFFERENTIAL (CANCER CENTER ONLY)
Abs Immature Granulocytes: 0.01 10*3/uL (ref 0.00–0.07)
Basophils Absolute: 0 10*3/uL (ref 0.0–0.1)
Basophils Relative: 1 %
Eosinophils Absolute: 0.3 10*3/uL (ref 0.0–0.5)
Eosinophils Relative: 5 %
HCT: 41.4 % (ref 36.0–46.0)
Hemoglobin: 13.2 g/dL (ref 12.0–15.0)
Immature Granulocytes: 0 %
Lymphocytes Relative: 16 %
Lymphs Abs: 1 10*3/uL (ref 0.7–4.0)
MCH: 28.4 pg (ref 26.0–34.0)
MCHC: 31.9 g/dL (ref 30.0–36.0)
MCV: 89.2 fL (ref 80.0–100.0)
Monocytes Absolute: 0.4 10*3/uL (ref 0.1–1.0)
Monocytes Relative: 6 %
Neutro Abs: 4.4 10*3/uL (ref 1.7–7.7)
Neutrophils Relative %: 72 %
Platelet Count: 208 10*3/uL (ref 150–400)
RBC: 4.64 MIL/uL (ref 3.87–5.11)
RDW: 13.5 % (ref 11.5–15.5)
WBC Count: 6 10*3/uL (ref 4.0–10.5)
nRBC: 0 % (ref 0.0–0.2)

## 2018-09-30 LAB — CMP (CANCER CENTER ONLY)
ALT: 18 U/L (ref 0–44)
AST: 18 U/L (ref 15–41)
Albumin: 3.8 g/dL (ref 3.5–5.0)
Alkaline Phosphatase: 78 U/L (ref 38–126)
Anion gap: 9 (ref 5–15)
BUN: 12 mg/dL (ref 8–23)
CO2: 25 mmol/L (ref 22–32)
Calcium: 9.3 mg/dL (ref 8.9–10.3)
Chloride: 105 mmol/L (ref 98–111)
Creatinine: 0.64 mg/dL (ref 0.44–1.00)
GFR, Est AFR Am: >60 mL/min (ref >60)
GFR, Estimated: >60 mL/min (ref >60)
Glucose, Bld: 105 mg/dL — ABNORMAL HIGH (ref 70–99)
Potassium: 4.2 mmol/L (ref 3.5–5.1)
Sodium: 139 mmol/L (ref 135–145)
Total Bilirubin: 0.3 mg/dL (ref 0.3–1.2)
Total Protein: 7 g/dL (ref 6.5–8.1)

## 2018-09-30 LAB — TSH: TSH: 1.019 u[IU]/mL (ref 0.308–3.960)

## 2018-09-30 MED ORDER — SODIUM CHLORIDE 0.9 % IV SOLN
200.0000 mg | Freq: Once | INTRAVENOUS | Status: AC
  Administered 2018-09-30: 13:00:00 200 mg via INTRAVENOUS
  Filled 2018-09-30: qty 8

## 2018-09-30 MED ORDER — SODIUM CHLORIDE 0.9 % IV SOLN
Freq: Once | INTRAVENOUS | Status: AC
  Administered 2018-09-30: 12:00:00 via INTRAVENOUS
  Filled 2018-09-30: qty 250

## 2018-09-30 NOTE — Progress Notes (Signed)
[facility] OFFICE PROGRESS NOTE   Diagnosis: Non-small cell lung cancer  INTERVAL HISTORY:   Brooke [name] returns as scheduled.  She completed another treatment with pembrolizumab on 09/11/2018.  No rash or diarrhea.  She reports a good appetite.  She relates weight loss to eating less milk chocolate.  She is now eating dark chocolate. She had increased dyspnea last week, improved after taking Mucinex.  No fever.  Objective:  Vital signs in last 24 hours:  Blood pressure 130/86, pulse 82, temperature (!) 97.3 F (36.3 C), temperature source Oral, resp. rate 17, height '5\' 3"'  (1.6 m), weight 114 lb 8 oz (51.9 kg), SpO2 98 %.     Resp: Lungs clear bilaterally, no respiratory distress Cardio: Regular rate and rhythm GI: Nontender, no hepatosplenomegaly Vascular: No leg edema  Skin: No rash    Lab Results:  Lab Results  Component Value Date   WBC 6.0 09/30/2018   HGB 13.2 09/30/2018   HCT 41.4 09/30/2018   MCV 89.2 09/30/2018   PLT 208 09/30/2018   NEUTROABS 4.4 09/30/2018    CMP  Lab Results  Component Value Date   NA 140 08/19/2018   K 4.0 08/19/2018   CL 105 08/19/2018   CO2 24 08/19/2018   GLUCOSE 85 08/19/2018   BUN 11 08/19/2018   CREATININE 0.66 08/19/2018   CALCIUM 9.3 08/19/2018   PROT 7.2 08/19/2018   ALBUMIN 3.7 08/19/2018   AST 17 08/19/2018   ALT 16 08/19/2018   ALKPHOS 86 08/19/2018   BILITOT 0.3 08/19/2018   GFRNONAA >60 08/19/2018   GFRAA >60 08/19/2018     Medications: I have reviewed the patient's current medications.   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  PDL1 60%  Left lung radiation 11/08/2016 through 11/21/2016  CT chest 12/12/2016-reexpansion of left upper lobe with a decreased left upper lobe mass, unchanged mediastinal adenopathy, progression of a splenic metastasis/pancreatic tail/gastrohepatic ligament metastasis, right lower lobe pneumonia  Cycle 1 Pembrolizumab 12/14/2016  Cycle 2 Pembrolizumab 01/03/2017  Cycle 3 Pembrolizumab 01/24/2017  Cycle 4 Pembrolizumab 02/12/2017  CT 03/05/2018-decrease in left upper lobe mass, mediastinal adenopathy, and splenic mass  Cycle 5 pembrolizumab 03/07/2017  Cycle 6 Pembrolizumab 04/02/2017  Cycle 7 pembrolizumab 04/25/2017  Cycle 8 Pembrolizumab 05/14/2017  Cycle 9 Pembrolizumab 06/06/2017  CT 06/20/2017-mild decrease in left upper lobe mass, mild increase in paramediastinal left upper lobe nodule-radiation change?, decreased splenic metastasis  Cycle 10 pembrolizumab 06/25/2017  Cycle 11 pembrolizumab 07/18/2017  Cycle 12 pembrolizumab 08/29/2017  Cycle 13 pembrolizumab 09/17/2017  Cycle 14 Pembrolizumab 10/10/2017  CT chest 10/24/2017-slight enlargement of small mediastinal lymph nodes, increased left upper lobe consolidation, decreased size of spleen lesion  Cycle 15 pembrolizumab 10/29/2017  Cycle 16 Pembrolizumab 11/21/2017  Cycle 17 Pembrolizumab 12/11/2017  Cycle 18 pembrolizumab 01/02/2018  Cycle 19 Pembrolizumab 01/21/2018  Cycle 20 Pembrolizumab 02/13/2018  CT chest 02/27/2018-decreased left paratracheal node, progressive consolidation/fibrosis in the left upper lung, decreased size of splenic mass  Cycle 21 pembrolizumab 03/04/2018  Cycle 22 Pembrolizumab 03/27/2018  Cycle 23 pembrolizumab 04/15/2018  Cycle 24 pembrolizumab 05/08/2018  Cycle 25 Pembrolizumab 05/27/2018  CT chest 06/16/2018- stable left  upper lung radiation fibrosis with no evidence of local tumor recurrence.  Decreased left  paratracheal adenopathy.  No new or progressive metastatic disease in the chest.  Gastrohepatic ligament lymphadenopathy stable.  Splenic metastasis decreased.  T5 vertebral compression fracture with associated patchy sclerosis and no discrete osseous lesion, new in the interval.  Cycle 26 Pembrolizumab 06/19/2018  Cycle 27 pembrolizumab 07/08/2018  Cycle 28 pembrolizumab 07/31/2018  Cycle 29 pembrolizumab 08/19/2018  Cycle 30 Pembrolizumab 09/11/2018  Cycle 31 pembrolizumab 09/30/2018  2.  History of acute respiratory failure secondary to #1  3. Oxygen dependent COPD  4. History of a NSTEMI 2015  5. Right lung pneumonia on chest CT 12/12/2016-treated with Levaquin  6.Grade 2 rash possibly related to Pembrolizumab. Treatment held 08/06/2017.Improved 08/29/2017, treatment resumed, rash has resolved    Disposition: Brooke [name] appears unchanged.  She will complete another treatment with pembrolizumab today.  She will return for an office visit and pembrolizumab in 3 weeks.  She will be scheduled for a restaging CT evaluation in July. 15 minutes were spent with the patient today.  The majority of the time was used for counseling and coordination of care.  Betsy Coder, MD  09/30/2018  10:46 AM

## 2018-10-01 ENCOUNTER — Telehealth: Payer: Self-pay | Admitting: Oncology

## 2018-10-01 ENCOUNTER — Telehealth: Payer: Self-pay

## 2018-10-01 NOTE — Telephone Encounter (Signed)
Advised that per [name] thyroid test were normal. Pt verbalized understanding.

## 2018-10-01 NOTE — Telephone Encounter (Signed)
Scheduled appt per 5/26 los.

## 2018-10-06 ENCOUNTER — Telehealth: Payer: Self-pay | Admitting: Oncology

## 2018-10-06 NOTE — Telephone Encounter (Signed)
Called patient per sch msg to see about patients request for an earlier time for 7/7 appt. Patient stated she is fine with the time. Confirmed dates and times of other appts for patient. No changes were made.

## 2018-10-22 ENCOUNTER — Other Ambulatory Visit: Payer: Self-pay

## 2018-10-22 DIAGNOSIS — C3412 Malignant neoplasm of upper lobe, left bronchus or lung: Secondary | ICD-10-CM

## 2018-10-23 ENCOUNTER — Telehealth: Payer: Self-pay | Admitting: *Deleted

## 2018-10-23 ENCOUNTER — Ambulatory Visit (HOSPITAL_COMMUNITY)
Admission: RE | Admit: 2018-10-23 | Discharge: 2018-10-23 | Disposition: A | Discharge status: Home / Self Care | Payer: Medicare Other | Source: Ambulatory Visit | Attending: Oncology | Admitting: Oncology

## 2018-10-23 ENCOUNTER — Inpatient Hospital Stay: Payer: Medicare Other | Attending: Oncology

## 2018-10-23 ENCOUNTER — Other Ambulatory Visit: Payer: Self-pay

## 2018-10-23 ENCOUNTER — Inpatient Hospital Stay (HOSPITAL_BASED_OUTPATIENT_CLINIC_OR_DEPARTMENT_OTHER): Payer: Medicare Other | Admitting: Oncology

## 2018-10-23 ENCOUNTER — Inpatient Hospital Stay: Payer: Medicare Other

## 2018-10-23 VITALS — BP 133/76 | HR 81 | Temp 98.1°F | Resp 18 | Ht 63.0 in | Wt 110.6 lb

## 2018-10-23 DIAGNOSIS — C3412 Malignant neoplasm of upper lobe, left bronchus or lung: Secondary | ICD-10-CM | POA: Insufficient documentation

## 2018-10-23 DIAGNOSIS — J449 Chronic obstructive pulmonary disease, unspecified: Secondary | ICD-10-CM

## 2018-10-23 DIAGNOSIS — R05 Cough: Secondary | ICD-10-CM | POA: Insufficient documentation

## 2018-10-23 DIAGNOSIS — C7889 Secondary malignant neoplasm of other digestive organs: Secondary | ICD-10-CM | POA: Insufficient documentation

## 2018-10-23 DIAGNOSIS — C772 Secondary and unspecified malignant neoplasm of intra-abdominal lymph nodes: Secondary | ICD-10-CM

## 2018-10-23 DIAGNOSIS — R0600 Dyspnea, unspecified: Secondary | ICD-10-CM

## 2018-10-23 DIAGNOSIS — Z5112 Encounter for antineoplastic immunotherapy: Secondary | ICD-10-CM | POA: Insufficient documentation

## 2018-10-23 DIAGNOSIS — Z923 Personal history of irradiation: Secondary | ICD-10-CM

## 2018-10-23 LAB — CMP (CANCER CENTER ONLY)
ALT: 17 U/L (ref 0–44)
AST: 19 U/L (ref 15–41)
Albumin: 3.9 g/dL (ref 3.5–5.0)
Alkaline Phosphatase: 70 U/L (ref 38–126)
Anion gap: 10 (ref 5–15)
BUN: 9 mg/dL (ref 8–23)
CO2: 24 mmol/L (ref 22–32)
Calcium: 9.3 mg/dL (ref 8.9–10.3)
Chloride: 102 mmol/L (ref 98–111)
Creatinine: 0.68 mg/dL (ref 0.44–1.00)
GFR, Est AFR Am: >60 mL/min (ref >60)
GFR, Estimated: >60 mL/min (ref >60)
Glucose, Bld: 107 mg/dL — ABNORMAL HIGH (ref 70–99)
Potassium: 4.2 mmol/L (ref 3.5–5.1)
Sodium: 136 mmol/L (ref 135–145)
Total Bilirubin: 0.4 mg/dL (ref 0.3–1.2)
Total Protein: 7.2 g/dL (ref 6.5–8.1)

## 2018-10-23 LAB — CBC WITH DIFFERENTIAL (CANCER CENTER ONLY)
Abs Immature Granulocytes: 0.01 10*3/uL (ref 0.00–0.07)
Basophils Absolute: 0 10*3/uL (ref 0.0–0.1)
Basophils Relative: 0 %
Eosinophils Absolute: 0.2 10*3/uL (ref 0.0–0.5)
Eosinophils Relative: 2 %
HCT: 44.3 % (ref 36.0–46.0)
Hemoglobin: 13.9 g/dL (ref 12.0–15.0)
Immature Granulocytes: 0 %
Lymphocytes Relative: 14 %
Lymphs Abs: 1.1 10*3/uL (ref 0.7–4.0)
MCH: 28.2 pg (ref 26.0–34.0)
MCHC: 31.4 g/dL (ref 30.0–36.0)
MCV: 89.9 fL (ref 80.0–100.0)
Monocytes Absolute: 0.5 10*3/uL (ref 0.1–1.0)
Monocytes Relative: 6 %
Neutro Abs: 5.9 10*3/uL (ref 1.7–7.7)
Neutrophils Relative %: 78 %
Platelet Count: 212 10*3/uL (ref 150–400)
RBC: 4.93 MIL/uL (ref 3.87–5.11)
RDW: 13.4 % (ref 11.5–15.5)
WBC Count: 7.7 10*3/uL (ref 4.0–10.5)
nRBC: 0 % (ref 0.0–0.2)

## 2018-10-23 MED ORDER — SODIUM CHLORIDE 0.9 % IV SOLN
Freq: Once | INTRAVENOUS | Status: AC
  Administered 2018-10-23: 10:00:00 via INTRAVENOUS
  Filled 2018-10-23: qty 250

## 2018-10-23 MED ORDER — SODIUM CHLORIDE 0.9 % IV SOLN
200.0000 mg | Freq: Once | INTRAVENOUS | Status: AC
  Administered 2018-10-23: 200 mg via INTRAVENOUS
  Filled 2018-10-23: qty 8

## 2018-10-23 NOTE — Telephone Encounter (Signed)
Notified patient that CXR shows chronic COPD and no pneumonia. Old compression fracture mid thoracic spine.

## 2018-10-23 NOTE — Patient Instructions (Signed)
[facility] Discharge Instructions for Patients Receiving Chemotherapy  Today you received the following chemotherapy agents Pembrolizumab (KEYTRUDA).  To help prevent nausea and vomiting after your treatment, we encourage you to take your nausea medication as prescribed.   If you develop nausea and vomiting that is not controlled by your nausea medication, call the clinic.   BELOW ARE SYMPTOMS THAT SHOULD BE REPORTED IMMEDIATELY:  *FEVER GREATER THAN 100.5 F  *CHILLS WITH OR WITHOUT FEVER  NAUSEA AND VOMITING THAT IS NOT CONTROLLED WITH YOUR NAUSEA MEDICATION  *UNUSUAL SHORTNESS OF BREATH  *UNUSUAL BRUISING OR BLEEDING  TENDERNESS IN MOUTH AND THROAT WITH OR WITHOUT PRESENCE OF ULCERS  *URINARY PROBLEMS  *BOWEL PROBLEMS  UNUSUAL RASH Items with * indicate a potential emergency and should be followed up as soon as possible.  Feel free to call the clinic should you have any questions or concerns. The clinic phone number is [phone].  Please show the CHEMO ALERT CARD at check-in to the Emergency Department and triage nurse.  Coronavirus (COVID-19) Are you at risk?  Are you at risk for the Coronavirus (COVID-19)?  To be considered HIGH RISK for Coronavirus (COVID-19), you have to meet the following criteria:  . Traveled to [place], [place], [place], [place] or [place]; or in the [place] to [place], [place], [place], or [place]; and have fever, cough, and shortness of breath within the last 2 weeks of travel OR . Been in close contact with a person diagnosed with COVID-19 within the last 2 weeks and have fever, cough, and shortness of breath . IF YOU DO NOT MEET THESE CRITERIA, YOU ARE CONSIDERED LOW RISK FOR COVID-19.  What to do if you are HIGH RISK for COVID-19?  . If you are having a medical emergency, call 911. . Seek medical care right away. Before you go to a doctor's office, urgent care or emergency department, call ahead and tell  them about your recent travel, contact with someone diagnosed with COVID-19, and your symptoms. You should receive instructions from your physician's office regarding next steps of care.  . When you arrive at healthcare provider, tell the healthcare staff immediately you have returned from visiting [place], [place], [place], [place] or [place]; or traveled in the [place] to [place], [place], [place], or [place]; in the last two weeks or you have been in close contact with a person diagnosed with COVID-19 in the last 2 weeks.   . Tell the health care staff about your symptoms: fever, cough and shortness of breath. . After you have been seen by a medical provider, you will be either: o Tested for (COVID-19) and discharged home on quarantine except to seek medical care if symptoms worsen, and asked to  - Stay home and avoid contact with others until you get your results (4-5 days)  - Avoid travel on public transportation if possible (such as bus, train, or airplane) or o Sent to the Emergency Department by EMS for evaluation, COVID-19 testing, and possible admission depending on your condition and test results.  What to do if you are LOW RISK for COVID-19?  Reduce your risk of any infection by using the same precautions used for avoiding the common cold or flu:  . Wash your hands often with soap and warm water for at least 20 seconds.  If soap and water are not readily available, use an alcohol-based hand sanitizer with at least 60% alcohol.  . If coughing or   sneezing, cover your mouth and nose by coughing or sneezing into the elbow areas of your shirt or coat, into a tissue or into your sleeve (not your hands). . Avoid shaking hands with others and consider head nods or verbal greetings only. . Avoid touching your eyes, nose, or mouth with unwashed hands.  . Avoid close contact with people who are sick. . Avoid places or events with large numbers of people in one location, like concerts or  sporting events. . Carefully consider travel plans you have or are making. . If you are planning any travel outside or inside the [place], visit the CDC's Travelers' Health webpage for the latest health notices. . If you have some symptoms but not all symptoms, continue to monitor at home and seek medical attention if your symptoms worsen. . If you are having a medical emergency, call 911.   ADDITIONAL HEALTHCARE OPTIONS FOR PATIENTS  [facility] / e-Visit: [email or web address]         [facility]: [phone]  [facility]: [phone]                   [facility]: [phone]    

## 2018-10-23 NOTE — Progress Notes (Signed)
[facility] OFFICE PROGRESS NOTE   Diagnosis: Non-small cell lung cancer  INTERVAL HISTORY:   Brooke [name] completed another cycle of pembrolizumab on 09/30/2018.  She reports a good appetite, but has not been eating as much.  She has increased dyspnea and a productive cough for the past few weeks.  No fever.  She is using her nebulizer more.  No rash or diarrhea.  Objective:  Vital signs in last 24 hours:  Blood pressure 133/76, pulse 81, temperature 98.1 F (36.7 C), temperature source Oral, resp. rate 18, height '5\' 3"'  (1.6 m), weight 110 lb 9.6 oz (50.2 kg), SpO2 100 %.    Resp: Lungs clear bilaterally in the anterior and posterior lung fields, no respiratory distress Cardio: Regular rate and rhythm GI: No hepatosplenomegaly, nontender Vascular: No leg edema    Lab Results:  Lab Results  Component Value Date   WBC 7.7 10/23/2018   HGB 13.9 10/23/2018   HCT 44.3 10/23/2018   MCV 89.9 10/23/2018   PLT 212 10/23/2018   NEUTROABS 5.9 10/23/2018    CMP  Lab Results  Component Value Date   NA 136 10/23/2018   K 4.2 10/23/2018   CL 102 10/23/2018   CO2 24 10/23/2018   GLUCOSE 107 (H) 10/23/2018   BUN 9 10/23/2018   CREATININE 0.68 10/23/2018   CALCIUM 9.3 10/23/2018   PROT 7.2 10/23/2018   ALBUMIN 3.9 10/23/2018   AST 19 10/23/2018   ALT 17 10/23/2018   ALKPHOS 70 10/23/2018   BILITOT 0.4 10/23/2018   GFRNONAA >60 10/23/2018   GFRAA >60 10/23/2018     Medications: I have reviewed the patient's current medications.   Assessment/Plan: 1.Left lung mass  PET scan 02/28/2016-hypermetabolic left upper lobe mass, hypermetabolic adjacent nodule, hypermetabolic AP window node  CT chest 10/28/2016-enlarging left upper lobe mass, increased AP window lymphadenopathy, new spleen metastasis, new adenopathy at the pancreas tail, upper abdomen, and middle mediastinum  CT-guided biopsy of the left lung mass on 11/01/2016, Non-small cell carcinoma most  consistent with squamous cell carcinoma  PDL1 60%  Left lung radiation 11/08/2016 through 11/21/2016  CT chest 12/12/2016-reexpansion of left upper lobe with a decreased left upper lobe mass, unchanged mediastinal adenopathy, progression of a splenic metastasis/pancreatic tail/gastrohepatic ligament metastasis, right lower lobe pneumonia  Cycle 1 Pembrolizumab 12/14/2016  Cycle 2 Pembrolizumab 01/03/2017  Cycle 3 Pembrolizumab 01/24/2017  Cycle 4 Pembrolizumab 02/12/2017  CT 03/05/2018-decrease in left upper lobe mass, mediastinal adenopathy, and splenic mass  Cycle 5 pembrolizumab 03/07/2017  Cycle 6 Pembrolizumab 04/02/2017  Cycle 7 pembrolizumab 04/25/2017  Cycle 8 Pembrolizumab 05/14/2017  Cycle 9 Pembrolizumab 06/06/2017  CT 06/20/2017-mild decrease in left upper lobe mass, mild increase in paramediastinal left upper lobe nodule-radiation change?, decreased splenic metastasis  Cycle 10 pembrolizumab 06/25/2017  Cycle 11 pembrolizumab 07/18/2017  Cycle 12 pembrolizumab 08/29/2017  Cycle 13 pembrolizumab 09/17/2017  Cycle 14 Pembrolizumab 10/10/2017  CT chest 10/24/2017-slight enlargement of small mediastinal lymph nodes, increased left upper lobe consolidation, decreased size of spleen lesion  Cycle 15 pembrolizumab 10/29/2017  Cycle 16 Pembrolizumab 11/21/2017  Cycle 17 Pembrolizumab 12/11/2017  Cycle 18 pembrolizumab 01/02/2018  Cycle 19 Pembrolizumab 01/21/2018  Cycle 20 Pembrolizumab 02/13/2018  CT chest 02/27/2018-decreased left paratracheal node, progressive consolidation/fibrosis in the left upper lung, decreased size of splenic mass  Cycle 21 pembrolizumab 03/04/2018  Cycle 22 Pembrolizumab 03/27/2018  Cycle 23 pembrolizumab 04/15/2018  Cycle 24 pembrolizumab 05/08/2018  Cycle 25 Pembrolizumab 05/27/2018  CT chest 06/16/2018- stable left upper lung  radiation fibrosis with no evidence of local tumor recurrence.  Decreased left paratracheal adenopathy.  No  new or progressive metastatic disease in the chest.  Gastrohepatic ligament lymphadenopathy stable.  Splenic metastasis decreased.  T5 vertebral compression fracture with associated patchy sclerosis and no discrete osseous lesion, new in the interval.  Cycle 26 Pembrolizumab 06/19/2018  Cycle 27 pembrolizumab 07/08/2018  Cycle 28 pembrolizumab 07/31/2018  Cycle 29 pembrolizumab 08/19/2018  Cycle 30 Pembrolizumab 09/11/2018  Cycle 31 pembrolizumab 09/30/2018  2.  History of acute respiratory failure secondary to #1  3. Oxygen dependent COPD  4. History of a NSTEMI 2015  5. Right lung pneumonia on chest CT 12/12/2016-treated with Levaquin  6.Grade 2 rash possibly related to Pembrolizumab. Treatment held 08/06/2017.Improved 08/29/2017, treatment resumed, rash has resolved  Disposition: Brooke [name] continues pembrolizumab for treatment of metastatic non-small cell lung cancer.  She has increased dyspnea and a cough.  This is most likely secondary to COPD.  We will check a chest x-ray today to look for evidence of pneumonia.  She will be scheduled for a restaging chest CT next week.  Brooke [name] will return for an office visit and pembrolizumab in 3 weeks.  She will contact us for increased dyspnea or new symptoms.  We will see her sooner as indicated depending on the CT and chest x-ray results.  25 minutes were spent with the patient today.  The majority of the time was used for counseling and coordination of care.  Betsy Coder, MD  10/23/2018  9:44 AM

## 2018-10-23 NOTE — Addendum Note (Signed)
Addended by: Betsy Coder B on: 10/23/2018 10:13 AM   Modules accepted: Orders

## 2018-10-28 ENCOUNTER — Telehealth: Payer: Self-pay

## 2018-10-28 ENCOUNTER — Encounter (HOSPITAL_COMMUNITY): Payer: Self-pay

## 2018-10-28 ENCOUNTER — Ambulatory Visit (HOSPITAL_COMMUNITY)
Admission: RE | Admit: 2018-10-28 | Discharge: 2018-10-28 | Disposition: A | Discharge status: Home / Self Care | Payer: Medicare Other | Source: Ambulatory Visit | Attending: Oncology | Admitting: Oncology

## 2018-10-28 ENCOUNTER — Other Ambulatory Visit: Payer: Self-pay

## 2018-10-28 DIAGNOSIS — C3412 Malignant neoplasm of upper lobe, left bronchus or lung: Secondary | ICD-10-CM | POA: Diagnosis present

## 2018-10-28 MED ORDER — IOHEXOL 300 MG/ML  SOLN
75.0000 mL | Freq: Once | INTRAMUSCULAR | Status: AC | PRN
  Administered 2018-10-28: 75 mL via INTRAVENOUS

## 2018-10-28 MED ORDER — SODIUM CHLORIDE (PF) 0.9 % IJ SOLN
INTRAMUSCULAR | Status: AC
  Filled 2018-10-28: qty 50

## 2018-10-28 NOTE — Telephone Encounter (Signed)
TC to patient per Dr Benay Spice to let her know CT shows no evidence of progressive cancer, lesion previously noted in the spleen is smaller, no explanation for increased shortness of breath. Patient verbalized understanding. No further problems or concerns at this time.

## 2018-11-09 ENCOUNTER — Other Ambulatory Visit: Payer: Self-pay | Admitting: Oncology

## 2018-11-11 ENCOUNTER — Inpatient Hospital Stay: Payer: Medicare Other | Attending: Oncology

## 2018-11-11 ENCOUNTER — Encounter: Payer: Self-pay | Admitting: Nurse Practitioner

## 2018-11-11 ENCOUNTER — Other Ambulatory Visit: Payer: Self-pay

## 2018-11-11 ENCOUNTER — Inpatient Hospital Stay (HOSPITAL_BASED_OUTPATIENT_CLINIC_OR_DEPARTMENT_OTHER): Payer: Medicare Other | Admitting: Nurse Practitioner

## 2018-11-11 ENCOUNTER — Inpatient Hospital Stay: Payer: Medicare Other

## 2018-11-11 VITALS — BP 142/80 | HR 84 | Temp 98.9°F | Resp 18 | Ht 63.0 in | Wt 111.2 lb

## 2018-11-11 DIAGNOSIS — J440 Chronic obstructive pulmonary disease with acute lower respiratory infection: Secondary | ICD-10-CM | POA: Insufficient documentation

## 2018-11-11 DIAGNOSIS — Z5112 Encounter for antineoplastic immunotherapy: Secondary | ICD-10-CM | POA: Diagnosis present

## 2018-11-11 DIAGNOSIS — I252 Old myocardial infarction: Secondary | ICD-10-CM

## 2018-11-11 DIAGNOSIS — R0600 Dyspnea, unspecified: Secondary | ICD-10-CM | POA: Diagnosis not present

## 2018-11-11 DIAGNOSIS — R634 Abnormal weight loss: Secondary | ICD-10-CM | POA: Insufficient documentation

## 2018-11-11 DIAGNOSIS — C3412 Malignant neoplasm of upper lobe, left bronchus or lung: Secondary | ICD-10-CM | POA: Insufficient documentation

## 2018-11-11 DIAGNOSIS — Z9981 Dependence on supplemental oxygen: Secondary | ICD-10-CM

## 2018-11-11 DIAGNOSIS — C7889 Secondary malignant neoplasm of other digestive organs: Secondary | ICD-10-CM

## 2018-11-11 LAB — CMP (CANCER CENTER ONLY)
ALT: 12 U/L (ref 0–44)
AST: 16 U/L (ref 15–41)
Albumin: 3.7 g/dL (ref 3.5–5.0)
Alkaline Phosphatase: 80 U/L (ref 38–126)
Anion gap: 9 (ref 5–15)
BUN: 12 mg/dL (ref 8–23)
CO2: 27 mmol/L (ref 22–32)
Calcium: 9.3 mg/dL (ref 8.9–10.3)
Chloride: 105 mmol/L (ref 98–111)
Creatinine: 0.66 mg/dL (ref 0.44–1.00)
GFR, Est AFR Am: >60 mL/min (ref >60)
GFR, Estimated: >60 mL/min (ref >60)
Glucose, Bld: 95 mg/dL (ref 70–99)
Potassium: 4.1 mmol/L (ref 3.5–5.1)
Sodium: 141 mmol/L (ref 135–145)
Total Bilirubin: 0.4 mg/dL (ref 0.3–1.2)
Total Protein: 7.2 g/dL (ref 6.5–8.1)

## 2018-11-11 MED ORDER — SODIUM CHLORIDE 0.9 % IV SOLN
200.0000 mg | Freq: Once | INTRAVENOUS | Status: AC
  Administered 2018-11-11: 200 mg via INTRAVENOUS
  Filled 2018-11-11: qty 8

## 2018-11-11 MED ORDER — SODIUM CHLORIDE 0.9 % IV SOLN
Freq: Once | INTRAVENOUS | Status: AC
  Administered 2018-11-11: 12:00:00 via INTRAVENOUS
  Filled 2018-11-11: qty 250

## 2018-11-11 NOTE — Progress Notes (Signed)
Per NP Marcello Moores, ok to tx without CBC today.

## 2018-11-11 NOTE — Progress Notes (Addendum)
[facility] OFFICE PROGRESS NOTE   Diagnosis: Non-small cell lung cancer  INTERVAL HISTORY:   Brooke [name] returns as scheduled.  She completed another cycle of pembrolizumab 10/23/2018.  Dyspnea is better.  She is more active.  No rash.  No diarrhea.  No nausea or vomiting.  Objective:  Vital signs in last 24 hours:  Blood pressure (!) 142/80, pulse 84, temperature 98.9 F (37.2 C), temperature source Oral, resp. rate 18, height _0  (1.6 m), weight 111 lb 3.2 oz (50.4 kg), SpO2 99 %.    Resp: Lungs clear bilaterally. Cardio: Regular rate and rhythm. GI: Abdomen soft and nontender.  No hepatomegaly. Vascular: No leg edema.  Skin: No rash.   Lab Results:  Lab Results  Component Value Date   WBC 7.7 10/23/2018   HGB 13.9 10/23/2018   HCT 44.3 10/23/2018   MCV 89.9 10/23/2018   PLT 212 10/23/2018   NEUTROABS 5.9 10/23/2018    Imaging:  No results found.  Medications: I have reviewed the patient's current medications.  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  PDL1 60%  Left lung radiation 11/08/2016 through 11/21/2016  CT chest 12/12/2016-reexpansion of left upper lobe with a decreased left upper lobe mass, unchanged mediastinal adenopathy, progression of a splenic metastasis/pancreatic tail/gastrohepatic ligament metastasis, right lower lobe pneumonia  Cycle 1 Pembrolizumab 12/14/2016  Cycle 2 Pembrolizumab 01/03/2017  Cycle 3 Pembrolizumab 01/24/2017  Cycle 4 Pembrolizumab 02/12/2017  CT 03/05/2018-decrease in left  upper lobe mass, mediastinal adenopathy, and splenic mass  Cycle 5 pembrolizumab 03/07/2017  Cycle 6 Pembrolizumab 04/02/2017  Cycle 7 pembrolizumab 04/25/2017  Cycle 8 Pembrolizumab 05/14/2017  Cycle 9 Pembrolizumab 06/06/2017  CT 06/20/2017-mild decrease in left upper lobe mass, mild increase in paramediastinal left upper lobe nodule-radiation change?, decreased splenic metastasis  Cycle 10 pembrolizumab 06/25/2017  Cycle 11 pembrolizumab 07/18/2017  Cycle 12 pembrolizumab 08/29/2017  Cycle 13 pembrolizumab 09/17/2017  Cycle 14 Pembrolizumab 10/10/2017  CT chest 10/24/2017-slight enlargement of small mediastinal lymph nodes, increased left upper lobe consolidation, decreased size of spleen lesion  Cycle 15 pembrolizumab 10/29/2017  Cycle 16 Pembrolizumab 11/21/2017  Cycle 17 Pembrolizumab 12/11/2017  Cycle 18 pembrolizumab 01/02/2018  Cycle 19 Pembrolizumab 01/21/2018  Cycle 20 Pembrolizumab 02/13/2018  CT chest 02/27/2018-decreased left paratracheal node, progressive consolidation/fibrosis in the left upper lung, decreased size of splenic mass  Cycle 21 pembrolizumab 03/04/2018  Cycle 22 Pembrolizumab 03/27/2018  Cycle 23 pembrolizumab 04/15/2018  Cycle 24 pembrolizumab 05/08/2018  Cycle 25 Pembrolizumab 05/27/2018  CT chest 06/16/2018- stable left upper lung radiation fibrosis with no evidence of local tumor recurrence. Decreased left paratracheal adenopathy. No new or progressive metastatic disease in the chest. Gastrohepatic ligament lymphadenopathy stable. Splenic metastasis decreased. T5 vertebral compression fracture with associated patchy sclerosis and no discrete osseous lesion, new in the interval.  Cycle 26 Pembrolizumab 06/19/2018  Cycle 27 pembrolizumab 07/08/2018  Cycle 28 pembrolizumab 07/31/2018  Cycle 29 pembrolizumab 08/19/2018  Cycle 30 Pembrolizumab 09/11/2018  Cycle 31 pembrolizumab 09/30/2018  Cycle 32 pembrolizumab 10/23/2018  CT chest 10/28/2018-  left apical pleural-parenchymal opacity consistent with radiation scarring has become more confluent likely representing evolutionary change.  Continued further decrease in size  of index left paratracheal node and splenic metastasis.  Gastrohepatic ligament lymph node not changed.  Cycle 33 Pembrolizumab 11/11/2018  2. History of acute respiratory failure secondary to #1  3. Oxygen dependent COPD  4. History of a NSTEMI 2015  5. Right lung pneumonia on chest CT 12/12/2016-treated with Levaquin  6.Grade 2 rash possibly related to Pembrolizumab. Treatment held 08/06/2017.Improved 08/29/2017, treatment resumed, rash has resolved   Disposition: Brooke [name] appears stable.  She has completed 32 cycles of pembrolizumab.  Recent restaging chest CT shows further improvement.  Plan to continue pembrolizumab on a 3-week schedule, cycle 33 today.  She will return for follow-up and pembrolizumab in 3 weeks.  She will contact the office in the interim with any problems.  Patient seen with [name].    [name] ANP/GNP-BC   11/11/2018  11:23 AM  This was a shared visit with [name].  Brooke [name] appears unchanged.  We discussed the restaging CT findings with her.  I reviewed the CT images.  There continues to be improvement in the splenic metastasis.  No evidence of disease progression.  I recommend continuing pembrolizumab.  We discussed switching to a 6-week schedule.  She would like to continue pembrolizumab on the 3-week schedule.  Julieanne Manson, MD

## 2018-11-11 NOTE — Patient Instructions (Signed)
[facility] Discharge Instructions for Patients Receiving Chemotherapy  Today you received the following chemotherapy agents Pembrolizumab [facility]).  To help prevent nausea and vomiting after your treatment, we encourage you to take your nausea medication as prescribed.   If you develop nausea and vomiting that is not controlled by your nausea medication, call the clinic.   BELOW ARE SYMPTOMS THAT SHOULD BE REPORTED IMMEDIATELY:  *FEVER GREATER THAN 100.5 F  *CHILLS WITH OR WITHOUT FEVER  NAUSEA AND VOMITING THAT IS NOT CONTROLLED WITH YOUR NAUSEA MEDICATION  *UNUSUAL SHORTNESS OF BREATH  *UNUSUAL BRUISING OR BLEEDING  TENDERNESS IN MOUTH AND THROAT WITH OR WITHOUT PRESENCE OF ULCERS  *URINARY PROBLEMS  *BOWEL PROBLEMS  UNUSUAL RASH Items with * indicate a potential emergency and should be followed up as soon as possible.  Feel free to call the clinic should you have any questions or concerns. The clinic phone number is [phone].  Please show the [facility] at check-in to the Emergency Department and triage nurse.

## 2018-11-11 NOTE — Progress Notes (Signed)
Gave patient referral for transportation services to [employer] [phone]) patient has number and stated that she would be giving them a call later.

## 2018-11-12 ENCOUNTER — Telehealth: Payer: Self-pay | Admitting: Nurse Practitioner

## 2018-11-12 NOTE — Telephone Encounter (Signed)
Scheduled per los. Mailed printout  °

## 2018-11-20 ENCOUNTER — Ambulatory Visit: Payer: Medicare Other | Admitting: Internal Medicine

## 2018-11-21 ENCOUNTER — Ambulatory Visit (INDEPENDENT_AMBULATORY_CARE_PROVIDER_SITE_OTHER): Payer: Medicare Other | Admitting: Nurse Practitioner

## 2018-11-21 ENCOUNTER — Ambulatory Visit: Payer: Medicare Other | Admitting: Internal Medicine

## 2018-11-21 ENCOUNTER — Encounter: Payer: Self-pay | Admitting: Nurse Practitioner

## 2018-11-21 ENCOUNTER — Other Ambulatory Visit: Payer: Self-pay

## 2018-11-21 DIAGNOSIS — J449 Chronic obstructive pulmonary disease, unspecified: Secondary | ICD-10-CM | POA: Diagnosis not present

## 2018-11-21 NOTE — Assessment & Plan Note (Addendum)
COPD: This is been a stable interval for patient.  She has been doing well and is starting to increase activity.  She walks daily.  She continues on oxygen at night and as needed throughout the day.  Patient Instructions  Plan A = Automatic = symbicort 160 Take 2 puffs first thing in am and then another 2 puffs about 12 hours later.  Work on inhaler technique:  relax and gently blow all the way out then take a nice smooth deep breath back in, triggering the inhaler at same time you start breathing in.  Hold for up to 5 seconds if you can. Blow out thru nose. Rinse and gargle with water when done  Plan B = Backup Only use your albuterol inhaler as a rescue medication to be used if you can't catch your breath by resting or doing a relaxed purse lip breathing pattern.  - The less you use it, the better it will work when you need it. - Ok to use the inhaler up to 2 puffs  every 4 hours if you must but call for appointment if use goes up over your usual need - Don't leave home without it !!  (think of it like the spare tire for your car)   Plan C = Crisis - only use your albuterol/ipatropium nebulizer if you first try Plan B and it fails to help > ok to use the nebulizer up to every 4 hours but if start needing it regularly call for immediate appointment  Follow up: Follow up with Dr. Melvyn Novas in 4 months or sooner if needed

## 2018-11-21 NOTE — Patient Instructions (Signed)
Plan A = Automatic = symbicort 160 Take 2 puffs first thing in am and then another 2 puffs about 12 hours later.  Work on inhaler technique:  relax and gently blow all the way out then take a nice smooth deep breath back in, triggering the inhaler at same time you start breathing in.  Hold for up to 5 seconds if you can. Blow out thru nose. Rinse and gargle with water when done  Plan B = Backup Only use your albuterol inhaler as a rescue medication to be used if you can't catch your breath by resting or doing a relaxed purse lip breathing pattern.  - The less you use it, the better it will work when you need it. - Ok to use the inhaler up to 2 puffs  every 4 hours if you must but call for appointment if use goes up over your usual need - Don't leave home without it !!  (think of it like the spare tire for your car)   Plan C = Crisis - only use your albuterol/ipatropium nebulizer if you first try Plan B and it fails to help > ok to use the nebulizer up to every 4 hours but if start needing it regularly call for immediate appointment  Follow up: Follow up with Dr. Melvyn Novas in 4 months or sooner if needed

## 2018-11-21 NOTE — Progress Notes (Signed)
Virtual Visit via Telephone Note  I connected with Brooke [name] on 11/21/18 at 11:00 AM EDT by telephone and verified that I am speaking with the correct person using two identifiers.  Location: Patient: home Provider: office   I discussed the limitations, risks, security and privacy concerns of performing an evaluation and management service by telephone and the availability of in person appointments. I also discussed with the patient that there may be a patient responsible charge related to this service. The patient expressed understanding and agreed to proceed.   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 Melvyn Novas.  Patient has a tele-visit today for follow-up.  He was last seen by Dr. Melvyn Novas on 06/13/2018.  She states that overall she has been doing well.  She is compliant with Symbicort and albuterol.  She uses DuoNeb as needed.  Patient continues on 2 L of oxygen at night and with exertion during the day.  Patient states that she has been increasing activity she has set up a 60 foot loop in her house and does 10 laps around the loop each day.  She is active and is able to perform activities of daily living without assistance. Denies f/c/s, n/v/d, hemoptysis, PND, leg swelling.    Observations/Objective: Spirometry 02/15/2016  FEV1 0.60 (28%)  Ratio 38 with classic curvature  p am symbicort 160  - 01/08/2017    continue dulera 200 2bid  - 12/09/2017  After extensive coaching inhaler device  effectiveness =    90% with smi >add spiriva to  symbicort  - 12/12/2017 informed could not tol spiriva due to dry mouth - - 06/13/2018  After extensive coaching inhaler device,  effectiveness =    75%  rx 2lpm hs and prn daytime since 2014  -  06/13/2018   Walked on 2lpm x  3 laps @  approx 263ft each @ nl pace  stopped due to  Sob after each lap  s desat     rec as of  06/13/2018  = 2lpm hs and prn with activity with goal of keeping sats > 90% but needs to learn to pace herself on her own or consider option of pulmonary rehab   Assessment and Plan: Patient Instructions  Plan A = Automatic = symbicort 160 Take 2 puffs first thing in am and then another 2 puffs about 12 hours later.  Work on inhaler technique:  relax and gently blow all the way out then take a nice smooth deep breath back in, triggering the inhaler at same time you start breathing in.  Hold for up to 5 seconds if you can. Blow out thru nose. Rinse and gargle with water when done  Plan B = Backup Only use your albuterol inhaler as a rescue medication to be used if you can't catch your breath by resting or doing a relaxed purse lip breathing pattern.  - The less you use it, the better it will work when you need it. - Ok to use the inhaler up to 2 puffs  every 4 hours if you must but call for appointment if use goes up over your usual need - Don't leave home without it !!  (think of it like the spare tire for your car)   Plan C = Crisis - only use your albuterol/ipatropium nebulizer if you first try Plan B and it fails to help >  ok to use the nebulizer up to every 4 hours but if start needing it regularly call for immediate appointment    Follow Up Instructions: Follow up: Follow up with Dr. Melvyn Novas in 4 months or sooner if needed    I discussed the assessment and treatment plan with the patient. The patient was provided an opportunity to ask questions and all were answered. The patient agreed with the plan and demonstrated an understanding of the instructions.   The patient was advised to call back or seek an in-person evaluation if the symptoms worsen or if the condition fails to improve as anticipated.  I provided 22 minutes of non-face-to-face time during this encounter.   Fenton Foy, NP

## 2018-12-03 ENCOUNTER — Other Ambulatory Visit: Payer: Self-pay | Admitting: *Deleted

## 2018-12-03 DIAGNOSIS — C3412 Malignant neoplasm of upper lobe, left bronchus or lung: Secondary | ICD-10-CM

## 2018-12-04 ENCOUNTER — Inpatient Hospital Stay: Payer: Medicare Other

## 2018-12-04 ENCOUNTER — Telehealth: Payer: Self-pay | Admitting: Oncology

## 2018-12-04 ENCOUNTER — Inpatient Hospital Stay (HOSPITAL_BASED_OUTPATIENT_CLINIC_OR_DEPARTMENT_OTHER): Payer: Medicare Other | Admitting: Oncology

## 2018-12-04 ENCOUNTER — Other Ambulatory Visit: Payer: Self-pay

## 2018-12-04 VITALS — BP 150/86 | HR 88 | Temp 98.5°F | Resp 17 | Ht 63.0 in | Wt 108.1 lb

## 2018-12-04 DIAGNOSIS — C7889 Secondary malignant neoplasm of other digestive organs: Secondary | ICD-10-CM | POA: Diagnosis not present

## 2018-12-04 DIAGNOSIS — R634 Abnormal weight loss: Secondary | ICD-10-CM | POA: Diagnosis not present

## 2018-12-04 DIAGNOSIS — I252 Old myocardial infarction: Secondary | ICD-10-CM

## 2018-12-04 DIAGNOSIS — Z5112 Encounter for antineoplastic immunotherapy: Secondary | ICD-10-CM | POA: Diagnosis not present

## 2018-12-04 DIAGNOSIS — C3412 Malignant neoplasm of upper lobe, left bronchus or lung: Secondary | ICD-10-CM | POA: Diagnosis not present

## 2018-12-04 DIAGNOSIS — Z9981 Dependence on supplemental oxygen: Secondary | ICD-10-CM

## 2018-12-04 DIAGNOSIS — J440 Chronic obstructive pulmonary disease with acute lower respiratory infection: Secondary | ICD-10-CM

## 2018-12-04 DIAGNOSIS — R0600 Dyspnea, unspecified: Secondary | ICD-10-CM | POA: Diagnosis not present

## 2018-12-04 LAB — CMP (CANCER CENTER ONLY)
ALT: 14 U/L (ref 0–44)
AST: 17 U/L (ref 15–41)
Albumin: 3.8 g/dL (ref 3.5–5.0)
Alkaline Phosphatase: 83 U/L (ref 38–126)
Anion gap: 8 (ref 5–15)
BUN: 12 mg/dL (ref 8–23)
CO2: 25 mmol/L (ref 22–32)
Calcium: 9.6 mg/dL (ref 8.9–10.3)
Chloride: 104 mmol/L (ref 98–111)
Creatinine: 0.65 mg/dL (ref 0.44–1.00)
GFR, Est AFR Am: >60 mL/min (ref >60)
GFR, Estimated: >60 mL/min (ref >60)
Glucose, Bld: 99 mg/dL (ref 70–99)
Potassium: 4.3 mmol/L (ref 3.5–5.1)
Sodium: 137 mmol/L (ref 135–145)
Total Bilirubin: 0.4 mg/dL (ref 0.3–1.2)
Total Protein: 7.4 g/dL (ref 6.5–8.1)

## 2018-12-04 LAB — CBC WITH DIFFERENTIAL (CANCER CENTER ONLY)
Abs Immature Granulocytes: 0.01 10*3/uL (ref 0.00–0.07)
Basophils Absolute: 0 10*3/uL (ref 0.0–0.1)
Basophils Relative: 1 %
Eosinophils Absolute: 0.2 10*3/uL (ref 0.0–0.5)
Eosinophils Relative: 4 %
HCT: 43.6 % (ref 36.0–46.0)
Hemoglobin: 14.1 g/dL (ref 12.0–15.0)
Immature Granulocytes: 0 %
Lymphocytes Relative: 19 %
Lymphs Abs: 1.1 10*3/uL (ref 0.7–4.0)
MCH: 27.9 pg (ref 26.0–34.0)
MCHC: 32.3 g/dL (ref 30.0–36.0)
MCV: 86.3 fL (ref 80.0–100.0)
Monocytes Absolute: 0.5 10*3/uL (ref 0.1–1.0)
Monocytes Relative: 8 %
Neutro Abs: 3.9 10*3/uL (ref 1.7–7.7)
Neutrophils Relative %: 68 %
Platelet Count: 230 10*3/uL (ref 150–400)
RBC: 5.05 MIL/uL (ref 3.87–5.11)
RDW: 13.3 % (ref 11.5–15.5)
WBC Count: 5.7 10*3/uL (ref 4.0–10.5)
nRBC: 0 % (ref 0.0–0.2)

## 2018-12-04 MED ORDER — SODIUM CHLORIDE 0.9 % IV SOLN
Freq: Once | INTRAVENOUS | Status: AC
  Administered 2018-12-04: 11:00:00 via INTRAVENOUS
  Filled 2018-12-04: qty 250

## 2018-12-04 MED ORDER — SODIUM CHLORIDE 0.9 % IV SOLN
200.0000 mg | Freq: Once | INTRAVENOUS | Status: AC
  Administered 2018-12-04: 200 mg via INTRAVENOUS
  Filled 2018-12-04: qty 8

## 2018-12-04 NOTE — Progress Notes (Signed)
[facility] OFFICE PROGRESS NOTE   Diagnosis: Non-small cell lung cancer  INTERVAL HISTORY:   Brooke [name] completed another treatment with pembrolizumab on 11/11/2018.  No rash or diarrhea.  She has increased dyspnea with the humid weather.  She is ambulating in the home for exercise.  She completes 30 laps around her house.  She reports a good appetite.  She relates weight loss to decrease in the intake of sugar.  Objective:  Vital signs in last 24 hours:  Blood pressure (!) 150/86, pulse 88, temperature 98.5 F (36.9 C), temperature source Oral, resp. rate 17, height '5\' 3"'  (1.6 m), weight 108 lb 1.6 oz (49 kg), SpO2 97 %.    Physical examination not performed today secondary to distancing with the COVID pandemic  Lab Results:  Lab Results  Component Value Date   WBC 5.7 12/04/2018   HGB 14.1 12/04/2018   HCT 43.6 12/04/2018   MCV 86.3 12/04/2018   PLT 230 12/04/2018   NEUTROABS 3.9 12/04/2018    CMP  Lab Results  Component Value Date   NA 141 11/11/2018   K 4.1 11/11/2018   CL 105 11/11/2018   CO2 27 11/11/2018   GLUCOSE 95 11/11/2018   BUN 12 11/11/2018   CREATININE 0.66 11/11/2018   CALCIUM 9.3 11/11/2018   PROT 7.2 11/11/2018   ALBUMIN 3.7 11/11/2018   AST 16 11/11/2018   ALT 12 11/11/2018   ALKPHOS 80 11/11/2018   BILITOT 0.4 11/11/2018   GFRNONAA >60 11/11/2018   GFRAA >60 11/11/2018     Medications: I have reviewed the patient's current medications.   Assessment/Plan:  1. 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  PDL1 60%  Left  lung radiation 11/08/2016 through 11/21/2016  CT chest 12/12/2016-reexpansion of left upper lobe with a decreased left upper lobe mass, unchanged mediastinal adenopathy, progression of a splenic metastasis/pancreatic tail/gastrohepatic ligament metastasis, right lower lobe pneumonia  Cycle 1 Pembrolizumab 12/14/2016  Cycle 2 Pembrolizumab 01/03/2017  Cycle 3 Pembrolizumab 01/24/2017  Cycle 4 Pembrolizumab 02/12/2017  CT 03/05/2018-decrease in left upper lobe mass, mediastinal adenopathy, and splenic mass  Cycle 5 pembrolizumab 03/07/2017  Cycle 6 Pembrolizumab 04/02/2017  Cycle 7 pembrolizumab 04/25/2017  Cycle 8 Pembrolizumab 05/14/2017  Cycle 9 Pembrolizumab 06/06/2017  CT 06/20/2017-mild decrease in left upper lobe mass, mild increase in paramediastinal left upper lobe nodule-radiation change?, decreased splenic metastasis  Cycle 10 pembrolizumab 06/25/2017  Cycle 11 pembrolizumab 07/18/2017  Cycle 12 pembrolizumab 08/29/2017  Cycle 13 pembrolizumab 09/17/2017  Cycle 14 Pembrolizumab 10/10/2017  CT chest 10/24/2017-slight enlargement of small mediastinal lymph nodes, increased left upper lobe consolidation, decreased size of spleen lesion  Cycle 15 pembrolizumab 10/29/2017  Cycle 16 Pembrolizumab 11/21/2017  Cycle 17 Pembrolizumab 12/11/2017  Cycle 18 pembrolizumab 01/02/2018  Cycle 19 Pembrolizumab 01/21/2018  Cycle 20 Pembrolizumab 02/13/2018  CT chest 02/27/2018-decreased left paratracheal node, progressive consolidation/fibrosis in the left upper lung, decreased size of splenic mass  Cycle 21 pembrolizumab 03/04/2018  Cycle 22 Pembrolizumab 03/27/2018  Cycle 23 pembrolizumab 04/15/2018  Cycle 24 pembrolizumab 05/08/2018  Cycle 25 Pembrolizumab 05/27/2018  CT chest 06/16/2018- stable left upper lung radiation fibrosis with no evidence of local tumor  recurrence. Decreased left paratracheal adenopathy. No new or progressive metastatic disease in the chest.  Gastrohepatic ligament lymphadenopathy stable. Splenic metastasis decreased. T5 vertebral compression fracture with associated patchy sclerosis and no discrete osseous lesion, new in the interval.  Cycle 26 Pembrolizumab 06/19/2018  Cycle 27 pembrolizumab 07/08/2018  Cycle 28 pembrolizumab 07/31/2018  Cycle 29 pembrolizumab 08/19/2018  Cycle 30 Pembrolizumab 09/11/2018  Cycle 31 pembrolizumab 09/30/2018  Cycle 32 pembrolizumab 10/23/2018  CT chest 10/28/2018- left apical pleural-parenchymal opacity consistent with radiation scarring has become more confluent likely representing evolutionary change.  Continued further decrease in size of index left paratracheal node and splenic metastasis.  Gastrohepatic ligament lymph node not changed.  Cycle 33 Pembrolizumab 11/11/2018  Cycle 34 pembrolizumab 12/04/2018  2. History of acute respiratory failure secondary to #1  3. Oxygen dependent COPD  4. History of a NSTEMI 2015  5. Right lung pneumonia on chest CT 12/12/2016-treated with Levaquin  6.Grade 2 rash possibly related to Pembrolizumab. Treatment held 08/06/2017.Improved 08/29/2017, treatment resumed, rash has resolved    Disposition: Brooke [name] appears unchanged.  She will complete another treatment with pembrolizumab today.  She would like to stay on the every 3-week schedule. I suspect her weight loss is secondary to a change in her diet and exercise.  We will check a TSH when she is here in September.  Brooke [name] will return for an office visit and pembrolizumab in 3 weeks.  Betsy Coder, MD  12/04/2018  9:47 AM

## 2018-12-04 NOTE — Telephone Encounter (Signed)
Scheduled appt per 7/30 los.  Printed calendar and avs.

## 2018-12-04 NOTE — Patient Instructions (Signed)
[facility] Discharge Instructions for Patients Receiving Chemotherapy  Today you received the following chemotherapy agents: Pembrolizumab (Keytruda)  To help prevent nausea and vomiting after your treatment, we encourage you to take your nausea medication as directed.   If you develop nausea and vomiting that is not controlled by your nausea medication, call the clinic.   BELOW ARE SYMPTOMS THAT SHOULD BE REPORTED IMMEDIATELY:  *FEVER GREATER THAN 100.5 F  *CHILLS WITH OR WITHOUT FEVER  NAUSEA AND VOMITING THAT IS NOT CONTROLLED WITH YOUR NAUSEA MEDICATION  *UNUSUAL SHORTNESS OF BREATH  *UNUSUAL BRUISING OR BLEEDING  TENDERNESS IN MOUTH AND THROAT WITH OR WITHOUT PRESENCE OF ULCERS  *URINARY PROBLEMS  *BOWEL PROBLEMS  UNUSUAL RASH Items with * indicate a potential emergency and should be followed up as soon as possible.  Feel free to call the clinic should you have any questions or concerns. The clinic phone number is [phone].  Please show the CHEMO ALERT CARD at check-in to the Emergency Department and triage nurse.  Coronavirus (COVID-19) Are you at risk?  Are you at risk for the Coronavirus (COVID-19)?  To be considered HIGH RISK for Coronavirus (COVID-19), you have to meet the following criteria:  . Traveled to [place], [place], [place], [place] or [place]; or in the [place] to [place], [place], [place], or [place]; and have fever, cough, and shortness of breath within the last 2 weeks of travel OR . Been in close contact with a person diagnosed with COVID-19 within the last 2 weeks and have fever, cough, and shortness of breath . IF YOU DO NOT MEET THESE CRITERIA, YOU ARE CONSIDERED LOW RISK FOR COVID-19.  What to do if you are HIGH RISK for COVID-19?  . If you are having a medical emergency, call 911. . Seek medical care right away. Before you go to a doctor's office, urgent care or emergency department, call ahead and tell them  about your recent travel, contact with someone diagnosed with COVID-19, and your symptoms. You should receive instructions from your physician's office regarding next steps of care.  . When you arrive at healthcare provider, tell the healthcare staff immediately you have returned from visiting [place], [place], [place], [place] or [place]; or traveled in the [place] to [place], [place], [place], or [place]; in the last two weeks or you have been in close contact with a person diagnosed with COVID-19 in the last 2 weeks.   . Tell the health care staff about your symptoms: fever, cough and shortness of breath. . After you have been seen by a medical provider, you will be either: o Tested for (COVID-19) and discharged home on quarantine except to seek medical care if symptoms worsen, and asked to  - Stay home and avoid contact with others until you get your results (4-5 days)  - Avoid travel on public transportation if possible (such as bus, train, or airplane) or o Sent to the Emergency Department by EMS for evaluation, COVID-19 testing, and possible admission depending on your condition and test results.  What to do if you are LOW RISK for COVID-19?  Reduce your risk of any infection by using the same precautions used for avoiding the common cold or flu:  . Wash your hands often with soap and warm water for at least 20 seconds.  If soap and water are not readily available, use an alcohol-based hand sanitizer with at least 60% alcohol.  . If coughing or   sneezing, cover your mouth and nose by coughing or sneezing into the elbow areas of your shirt or coat, into a tissue or into your sleeve (not your hands). . Avoid shaking hands with others and consider head nods or verbal greetings only. . Avoid touching your eyes, nose, or mouth with unwashed hands.  . Avoid close contact with people who are sick. . Avoid places or events with large numbers of people in one location, like concerts or  sporting events. . Carefully consider travel plans you have or are making. . If you are planning any travel outside or inside the [place], visit the CDC's Travelers' Health webpage for the latest health notices. . If you have some symptoms but not all symptoms, continue to monitor at home and seek medical attention if your symptoms worsen. . If you are having a medical emergency, call 911.   ADDITIONAL HEALTHCARE OPTIONS FOR PATIENTS   Telehealth / e-Visit: [email or web address]         [facility]: [phone]  [facility]: [phone]                   [facility]: [phone]   

## 2018-12-04 NOTE — Addendum Note (Signed)
Addended by: Betsy Coder B on: 12/04/2018 11:25 AM   Modules accepted: Orders

## 2018-12-08 NOTE — Progress Notes (Signed)
Chart and office note reviewed in detail  > agree with a/p as outlined    

## 2018-12-13 ENCOUNTER — Other Ambulatory Visit: Payer: Self-pay | Admitting: Physician Assistant

## 2018-12-16 ENCOUNTER — Telehealth: Payer: Self-pay | Admitting: Cardiovascular Disease

## 2018-12-16 NOTE — Telephone Encounter (Signed)
Pt overdue for 12 month f/u. Please contact pt for future appointment. Pt needing refills.

## 2018-12-16 NOTE — Telephone Encounter (Signed)
Called patient to schedule 1 yr followup, but, patient is going through chemotherapy and would like to do a virtual visit.  Message sent to the nurse.

## 2018-12-17 NOTE — Telephone Encounter (Signed)
Rx(s) sent to pharmacy electronically.  

## 2018-12-17 NOTE — Telephone Encounter (Signed)
New Message      *STAT* If patient is at the pharmacy, call can be transferred to refill team.   1. Which medications need to be refilled? (please list name of each medication and dose if known) Atorvastatin 20mg   2. Which pharmacy/location (including street and city if local pharmacy) is medication to be sent to? [facility] in [place]  3. Do they need a 30 day or 90 day supply? 90 day supply

## 2018-12-21 ENCOUNTER — Other Ambulatory Visit: Payer: Self-pay | Admitting: Oncology

## 2018-12-22 ENCOUNTER — Other Ambulatory Visit: Payer: Self-pay | Admitting: *Deleted

## 2018-12-22 DIAGNOSIS — C3412 Malignant neoplasm of upper lobe, left bronchus or lung: Secondary | ICD-10-CM

## 2018-12-23 ENCOUNTER — Inpatient Hospital Stay: Payer: Medicare Other

## 2018-12-23 ENCOUNTER — Telehealth: Payer: Self-pay | Admitting: Oncology

## 2018-12-23 ENCOUNTER — Other Ambulatory Visit: Payer: Self-pay

## 2018-12-23 ENCOUNTER — Inpatient Hospital Stay: Payer: Medicare Other | Attending: Oncology | Admitting: Oncology

## 2018-12-23 VITALS — BP 138/78 | HR 81 | Temp 97.8°F | Resp 18 | Ht 63.0 in | Wt 106.2 lb

## 2018-12-23 DIAGNOSIS — R161 Splenomegaly, not elsewhere classified: Secondary | ICD-10-CM | POA: Insufficient documentation

## 2018-12-23 DIAGNOSIS — J440 Chronic obstructive pulmonary disease with acute lower respiratory infection: Secondary | ICD-10-CM | POA: Insufficient documentation

## 2018-12-23 DIAGNOSIS — C3412 Malignant neoplasm of upper lobe, left bronchus or lung: Secondary | ICD-10-CM

## 2018-12-23 DIAGNOSIS — J701 Chronic and other pulmonary manifestations due to radiation: Secondary | ICD-10-CM | POA: Diagnosis not present

## 2018-12-23 DIAGNOSIS — Z79899 Other long term (current) drug therapy: Secondary | ICD-10-CM | POA: Diagnosis not present

## 2018-12-23 DIAGNOSIS — Z5112 Encounter for antineoplastic immunotherapy: Secondary | ICD-10-CM | POA: Insufficient documentation

## 2018-12-23 DIAGNOSIS — Z9981 Dependence on supplemental oxygen: Secondary | ICD-10-CM | POA: Diagnosis not present

## 2018-12-23 DIAGNOSIS — R5381 Other malaise: Secondary | ICD-10-CM | POA: Diagnosis not present

## 2018-12-23 DIAGNOSIS — R062 Wheezing: Secondary | ICD-10-CM | POA: Diagnosis not present

## 2018-12-23 DIAGNOSIS — I252 Old myocardial infarction: Secondary | ICD-10-CM | POA: Diagnosis not present

## 2018-12-23 DIAGNOSIS — C7889 Secondary malignant neoplasm of other digestive organs: Secondary | ICD-10-CM | POA: Insufficient documentation

## 2018-12-23 DIAGNOSIS — R0600 Dyspnea, unspecified: Secondary | ICD-10-CM | POA: Insufficient documentation

## 2018-12-23 DIAGNOSIS — R21 Rash and other nonspecific skin eruption: Secondary | ICD-10-CM | POA: Diagnosis not present

## 2018-12-23 DIAGNOSIS — R590 Localized enlarged lymph nodes: Secondary | ICD-10-CM | POA: Insufficient documentation

## 2018-12-23 LAB — CBC WITH DIFFERENTIAL (CANCER CENTER ONLY)
Abs Immature Granulocytes: 0.01 10*3/uL (ref 0.00–0.07)
Basophils Absolute: 0 10*3/uL (ref 0.0–0.1)
Basophils Relative: 0 %
Eosinophils Absolute: 0.2 10*3/uL (ref 0.0–0.5)
Eosinophils Relative: 3 %
HCT: 41 % (ref 36.0–46.0)
Hemoglobin: 13.5 g/dL (ref 12.0–15.0)
Immature Granulocytes: 0 %
Lymphocytes Relative: 13 %
Lymphs Abs: 0.9 10*3/uL (ref 0.7–4.0)
MCH: 28.7 pg (ref 26.0–34.0)
MCHC: 32.9 g/dL (ref 30.0–36.0)
MCV: 87 fL (ref 80.0–100.0)
Monocytes Absolute: 0.6 10*3/uL (ref 0.1–1.0)
Monocytes Relative: 9 %
Neutro Abs: 5 10*3/uL (ref 1.7–7.7)
Neutrophils Relative %: 75 %
Platelet Count: 241 10*3/uL (ref 150–400)
RBC: 4.71 MIL/uL (ref 3.87–5.11)
RDW: 13.3 % (ref 11.5–15.5)
WBC Count: 6.7 10*3/uL (ref 4.0–10.5)
nRBC: 0 % (ref 0.0–0.2)

## 2018-12-23 LAB — CMP (CANCER CENTER ONLY)
ALT: 14 U/L (ref 0–44)
AST: 16 U/L (ref 15–41)
Albumin: 3.6 g/dL (ref 3.5–5.0)
Alkaline Phosphatase: 79 U/L (ref 38–126)
Anion gap: 11 (ref 5–15)
BUN: 10 mg/dL (ref 8–23)
CO2: 23 mmol/L (ref 22–32)
Calcium: 9.2 mg/dL (ref 8.9–10.3)
Chloride: 104 mmol/L (ref 98–111)
Creatinine: 0.63 mg/dL (ref 0.44–1.00)
GFR, Est AFR Am: >60 mL/min (ref >60)
GFR, Estimated: >60 mL/min (ref >60)
Glucose, Bld: 104 mg/dL — ABNORMAL HIGH (ref 70–99)
Potassium: 4.1 mmol/L (ref 3.5–5.1)
Sodium: 138 mmol/L (ref 135–145)
Total Bilirubin: 0.3 mg/dL (ref 0.3–1.2)
Total Protein: 7 g/dL (ref 6.5–8.1)

## 2018-12-23 MED ORDER — SODIUM CHLORIDE 0.9 % IV SOLN
200.0000 mg | Freq: Once | INTRAVENOUS | Status: AC
  Administered 2018-12-23: 200 mg via INTRAVENOUS
  Filled 2018-12-23: qty 8

## 2018-12-23 MED ORDER — SODIUM CHLORIDE 0.9 % IV SOLN
Freq: Once | INTRAVENOUS | Status: AC
  Administered 2018-12-23: 12:00:00 via INTRAVENOUS
  Filled 2018-12-23: qty 250

## 2018-12-23 NOTE — Progress Notes (Signed)
[facility] OFFICE PROGRESS NOTE   Diagnosis: Non-small cell lung cancer  INTERVAL HISTORY:   Brooke [name] returns as scheduled.  She completed another treatment with pembrolizumab on 12/04/2018.  She reports malaise.  She has increased dyspnea with the hot humid weather.  She is exercising in the home.  She reports a good appetite, but does not have energy to make food.  Her ex-husband brings her food.  No rash or diarrhea.  Objective:  Vital signs in last 24 hours:  Blood pressure 138/78, pulse 81, temperature 97.8 F (36.6 C), temperature source Oral, resp. rate 18, height _0  (1.6 m), weight 106 lb 3.2 oz (48.2 kg), SpO2 100 %.     Resp: Distant breath sounds, mild wheeze at the left lower posterior chest, no respiratory distress Cardio: Regular rate and rhythm GI: No hepatosplenomegaly Vascular: No leg edema  Skin: No rash   Lab Results:  Lab Results  Component Value Date   WBC 6.7 12/23/2018   HGB 13.5 12/23/2018   HCT 41.0 12/23/2018   MCV 87.0 12/23/2018   PLT 241 12/23/2018   NEUTROABS 5.0 12/23/2018    CMP  Lab Results  Component Value Date   NA 138 12/23/2018   K 4.1 12/23/2018   CL 104 12/23/2018   CO2 23 12/23/2018   GLUCOSE 104 (H) 12/23/2018   BUN 10 12/23/2018   CREATININE 0.63 12/23/2018   CALCIUM 9.2 12/23/2018   PROT 7.0 12/23/2018   ALBUMIN 3.6 12/23/2018   AST 16 12/23/2018   ALT 14 12/23/2018   ALKPHOS 79 12/23/2018   BILITOT 0.3 12/23/2018   GFRNONAA >60 12/23/2018   GFRAA >60 12/23/2018     Medications: I have reviewed the patient's current medications.   Assessment/Plan: 1.  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  PDL1 60%  Left lung radiation 11/08/2016 through 11/21/2016  CT chest 12/12/2016-reexpansion of left upper lobe with a decreased left upper lobe mass, unchanged mediastinal adenopathy, progression of a splenic metastasis/pancreatic tail/gastrohepatic ligament metastasis, right lower lobe pneumonia  Cycle 1 Pembrolizumab 12/14/2016  Cycle 2 Pembrolizumab 01/03/2017  Cycle 3 Pembrolizumab 01/24/2017  Cycle 4 Pembrolizumab 02/12/2017  CT 03/05/2018-decrease in left upper lobe mass, mediastinal adenopathy, and splenic mass  Cycle 5 pembrolizumab 03/07/2017  Cycle 6 Pembrolizumab 04/02/2017  Cycle 7 pembrolizumab 04/25/2017  Cycle 8 Pembrolizumab 05/14/2017  Cycle 9 Pembrolizumab 06/06/2017  CT 06/20/2017-mild decrease in left upper lobe mass, mild increase in paramediastinal left upper lobe nodule-radiation change?, decreased splenic metastasis  Cycle 10 pembrolizumab 06/25/2017  Cycle 11 pembrolizumab 07/18/2017  Cycle 12 pembrolizumab 08/29/2017  Cycle 13 pembrolizumab 09/17/2017  Cycle 14 Pembrolizumab 10/10/2017  CT chest 10/24/2017-slight enlargement of small mediastinal lymph nodes, increased left upper lobe consolidation, decreased size of spleen lesion  Cycle 15 pembrolizumab 10/29/2017  Cycle 16 Pembrolizumab 11/21/2017  Cycle 17 Pembrolizumab 12/11/2017  Cycle 18 pembrolizumab 01/02/2018  Cycle 19 Pembrolizumab 01/21/2018  Cycle 20 Pembrolizumab 02/13/2018  CT chest 02/27/2018-decreased left paratracheal node, progressive consolidation/fibrosis in the left upper lung, decreased size of splenic mass  Cycle 21 pembrolizumab 03/04/2018  Cycle 22 Pembrolizumab 03/27/2018  Cycle 23 pembrolizumab 04/15/2018  Cycle 24  pembrolizumab 05/08/2018  Cycle 25 Pembrolizumab 05/27/2018  CT chest 06/16/2018- stable left upper lung radiation fibrosis with no evidence  of local tumor recurrence. Decreased left paratracheal adenopathy. No new or progressive metastatic disease in the chest. Gastrohepatic ligament lymphadenopathy stable. Splenic metastasis decreased. T5 vertebral compression fracture with associated patchy sclerosis and no discrete osseous lesion, new in the interval.  Cycle 26 Pembrolizumab 06/19/2018  Cycle 27 pembrolizumab 07/08/2018  Cycle 28 pembrolizumab 07/31/2018  Cycle 29 pembrolizumab 08/19/2018  Cycle 30 Pembrolizumab 09/11/2018  Cycle 31 pembrolizumab 09/30/2018  Cycle 32 pembrolizumab 10/23/2018  CT chest 10/28/2018- left apical pleural-parenchymal opacity consistent with radiation scarring has become more confluent likely representing evolutionary change.  Continued further decrease in size of index left paratracheal node and splenic metastasis.  Gastrohepatic ligament lymph node not changed.  Cycle 33 Pembrolizumab 11/11/2018  Cycle 34 pembrolizumab 12/04/2018  Cycle 35 pembrolizumab 12/23/2018   2. History of acute respiratory failure secondary to #1  3. Oxygen dependent COPD  4. History of a NSTEMI 2015  5. Right lung pneumonia on chest CT 12/12/2016-treated with Levaquin  6.Grade 2 rash possibly related to Pembrolizumab. Treatment held 08/06/2017.Improved 08/29/2017, treatment resumed, rash has resolved     Disposition: Brooke [name] appears unchanged.  She reports increased malaise and she has lost weight over the past several months.  Her symptoms may be related to COPD during the hot/humid weather.  She does not feel "depressed "but agrees to a consultation with the Cancer center social worker.  We will check a TSH when she returns in 3 weeks.  We will plan for a restaging CT evaluation in 2-3 months.  I have a low clinical suspicion for progression of the lung cancer.  Betsy Coder, MD  12/23/2018  11:18 AM

## 2018-12-23 NOTE — Telephone Encounter (Signed)
Scheduled per 08/18 los, patient received avs and calender.

## 2018-12-24 ENCOUNTER — Encounter: Payer: Self-pay | Admitting: *Deleted

## 2018-12-24 NOTE — Progress Notes (Signed)
Brooke [name]   Clinical Social [name] received referral for emotional support.  CSW contacted patient at home to offer support and assess for needs.  Patient stated she has recently been experiencing increased fatigue and lack of energy to do "the things she enjoys".  CSW and patient discussed common feelings and emotions when going through treatment.  CSW and patient also discussed the emotional toll of Covid and quarantine.  Patient stated she felt better today and was able to get outside.  Patient stated she has been exercising and set up "circuits" inside her home.  Patient stated these were exercises she learned from rehab.  Patient identified cooking and walking as positive coping skills when she feels depressed.  Patient also enjoys books and reading.  CSW informed patient of the support team and support services at [facility].  CSW and patent discussed virtual support groups, cooking class, and art programs.  Patient was agreeable to being added to the Support Programs mailing list.  CSW and patient discussed counseling options.  CSW provided patient with contact information and encouraged patient to call with questions or concerns.    Brooke [name], MSW, LCSW, OSW-C Clinical Social Worker [facility] [phone]

## 2019-01-15 ENCOUNTER — Inpatient Hospital Stay (HOSPITAL_BASED_OUTPATIENT_CLINIC_OR_DEPARTMENT_OTHER): Payer: Medicare Other | Admitting: Oncology

## 2019-01-15 ENCOUNTER — Inpatient Hospital Stay: Payer: Medicare Other

## 2019-01-15 ENCOUNTER — Inpatient Hospital Stay: Payer: Medicare Other | Attending: Oncology

## 2019-01-15 ENCOUNTER — Other Ambulatory Visit: Payer: Self-pay

## 2019-01-15 ENCOUNTER — Telehealth: Payer: Self-pay | Admitting: Oncology

## 2019-01-15 VITALS — BP 135/67 | HR 80 | Temp 98.7°F | Resp 18 | Ht 63.0 in | Wt 104.5 lb

## 2019-01-15 DIAGNOSIS — C3412 Malignant neoplasm of upper lobe, left bronchus or lung: Secondary | ICD-10-CM | POA: Insufficient documentation

## 2019-01-15 DIAGNOSIS — R590 Localized enlarged lymph nodes: Secondary | ICD-10-CM | POA: Diagnosis not present

## 2019-01-15 DIAGNOSIS — Z9981 Dependence on supplemental oxygen: Secondary | ICD-10-CM | POA: Insufficient documentation

## 2019-01-15 DIAGNOSIS — I252 Old myocardial infarction: Secondary | ICD-10-CM | POA: Diagnosis not present

## 2019-01-15 DIAGNOSIS — Z79899 Other long term (current) drug therapy: Secondary | ICD-10-CM | POA: Insufficient documentation

## 2019-01-15 DIAGNOSIS — J449 Chronic obstructive pulmonary disease, unspecified: Secondary | ICD-10-CM | POA: Diagnosis not present

## 2019-01-15 DIAGNOSIS — C7889 Secondary malignant neoplasm of other digestive organs: Secondary | ICD-10-CM | POA: Insufficient documentation

## 2019-01-15 LAB — CMP (CANCER CENTER ONLY)
ALT: 14 U/L (ref 0–44)
AST: 17 U/L (ref 15–41)
Albumin: 3.9 g/dL (ref 3.5–5.0)
Alkaline Phosphatase: 77 U/L (ref 38–126)
Anion gap: 8 (ref 5–15)
BUN: 10 mg/dL (ref 8–23)
CO2: 27 mmol/L (ref 22–32)
Calcium: 9.2 mg/dL (ref 8.9–10.3)
Chloride: 104 mmol/L (ref 98–111)
Creatinine: 0.64 mg/dL (ref 0.44–1.00)
GFR, Est AFR Am: >60 mL/min (ref >60)
GFR, Estimated: >60 mL/min (ref >60)
Glucose, Bld: 97 mg/dL (ref 70–99)
Potassium: 4.2 mmol/L (ref 3.5–5.1)
Sodium: 139 mmol/L (ref 135–145)
Total Bilirubin: 0.4 mg/dL (ref 0.3–1.2)
Total Protein: 7.1 g/dL (ref 6.5–8.1)

## 2019-01-15 LAB — TSH: TSH: 0.597 u[IU]/mL (ref 0.308–3.960)

## 2019-01-15 NOTE — Telephone Encounter (Signed)
Scheduled per 09/10 los and cancelled 09/29 appointments per providers request. Patient received calender.

## 2019-01-15 NOTE — Progress Notes (Signed)
[place] OFFICE PROGRESS NOTE   Diagnosis: Non-small cell lung cancer  INTERVAL HISTORY:   Brooke [name] completed another treatment with pembrolizumab on 12/23/2018.  She reports improvement in her appetite and energy level over recent weeks.  She was able to drain a "sebaceous cyst "at the anterior chest wall.  She is exercising in the home.  Objective:  Vital signs in last 24 hours:  Blood pressure 135/67, pulse 80, temperature 98.7 F (37.1 C), temperature source Oral, resp. rate 18, height '5\' 3"'  (1.6 m), weight 104 lb 8 oz (47.4 kg), SpO2 99 %.    Limited physical examination secondary to distancing with the COVID pandemic GI: No hepatosplenomegaly, nontender Skin: 1 cm firm cystic lesion at the low anterior near the left side of the xiphoid  Lab Results:  Lab Results  Component Value Date   WBC 6.7 12/23/2018   HGB 13.5 12/23/2018   HCT 41.0 12/23/2018   MCV 87.0 12/23/2018   PLT 241 12/23/2018   NEUTROABS 5.0 12/23/2018    CMP  Lab Results  Component Value Date   NA 139 01/15/2019   K 4.2 01/15/2019   CL 104 01/15/2019   CO2 27 01/15/2019   GLUCOSE 97 01/15/2019   BUN 10 01/15/2019   CREATININE 0.64 01/15/2019   CALCIUM 9.2 01/15/2019   PROT 7.1 01/15/2019   ALBUMIN 3.9 01/15/2019   AST 17 01/15/2019   ALT 14 01/15/2019   ALKPHOS 77 01/15/2019   BILITOT 0.4 01/15/2019   GFRNONAA >60 01/15/2019   GFRAA >60 01/15/2019    Medications: I have reviewed the patient's current medications.   Assessment/Plan:   1. 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  PDL1 60%  Left lung radiation 11/08/2016 through 11/21/2016  CT chest 12/12/2016-reexpansion of left upper lobe with a decreased left upper lobe mass, unchanged mediastinal adenopathy, progression of a splenic metastasis/pancreatic tail/gastrohepatic ligament metastasis, right lower lobe pneumonia  Cycle 1 Pembrolizumab 12/14/2016  Cycle 2 Pembrolizumab 01/03/2017  Cycle 3 Pembrolizumab 01/24/2017  Cycle 4 Pembrolizumab 02/12/2017  CT 03/05/2018-decrease in left upper lobe mass, mediastinal adenopathy, and splenic mass  Cycle 5 pembrolizumab 03/07/2017  Cycle 6 Pembrolizumab 04/02/2017  Cycle 7 pembrolizumab 04/25/2017  Cycle 8 Pembrolizumab 05/14/2017  Cycle 9 Pembrolizumab 06/06/2017  CT 06/20/2017-mild decrease in left upper lobe mass, mild increase in paramediastinal left upper lobe nodule-radiation change?, decreased splenic metastasis  Cycle 10 pembrolizumab 06/25/2017  Cycle 11 pembrolizumab 07/18/2017  Cycle 12 pembrolizumab 08/29/2017  Cycle 13 pembrolizumab 09/17/2017  Cycle 14 Pembrolizumab 10/10/2017  CT chest 10/24/2017-slight enlargement of small mediastinal lymph nodes, increased left upper lobe consolidation, decreased size of spleen lesion  Cycle 15 pembrolizumab 10/29/2017  Cycle 16 Pembrolizumab 11/21/2017  Cycle 17 Pembrolizumab 12/11/2017  Cycle 18 pembrolizumab 01/02/2018  Cycle 19 Pembrolizumab 01/21/2018  Cycle 20 Pembrolizumab 02/13/2018  CT chest 02/27/2018-decreased left paratracheal node, progressive consolidation/fibrosis in the left upper lung, decreased size of splenic mass  Cycle 21 pembrolizumab 03/04/2018  Cycle 22 Pembrolizumab 03/27/2018  Cycle 23 pembrolizumab 04/15/2018  Cycle 24 pembrolizumab 05/08/2018  Cycle 25 Pembrolizumab 05/27/2018  CT chest 06/16/2018- stable left upper lung radiation fibrosis with no evidence  of local tumor recurrence. Decreased left paratracheal adenopathy. No new or progressive  metastatic disease in the chest. Gastrohepatic ligament lymphadenopathy stable. Splenic metastasis decreased. T5 vertebral compression fracture with associated patchy sclerosis and no discrete osseous lesion, new in the interval.  Cycle 26 Pembrolizumab 06/19/2018  Cycle 27 pembrolizumab 07/08/2018  Cycle 28 pembrolizumab 07/31/2018  Cycle 29 pembrolizumab 08/19/2018  Cycle 30 Pembrolizumab 09/11/2018  Cycle 31 pembrolizumab 09/30/2018  Cycle 32 pembrolizumab 10/23/2018  CT chest 10/28/2018- left apical pleural-parenchymal opacity consistent with radiation scarring has become more confluent likely representing evolutionary change.  Continued further decrease in size of index left paratracheal node and splenic metastasis.  Gastrohepatic ligament lymph node not changed.  Cycle 33 Pembrolizumab 11/11/2018  Cycle 34 pembrolizumab 12/04/2018  Cycle 35 pembrolizumab 12/23/2018   2. History of acute respiratory failure secondary to #1  3. Oxygen dependent COPD  4. History of a NSTEMI 2015  5. Right lung pneumonia on chest CT 12/12/2016-treated with Levaquin  6.Grade 2 rash possibly related to Pembrolizumab. Treatment held 08/06/2017.Improved 08/29/2017, treatment resumed, rash has resolved    Disposition: Brooke [name] appears stable.  She has completed 2 years of treatment with pembrolizumab.  There is no evidence of disease progression.  We decided to place treatment on hold.  She will return for an office visit and restaging chest CT in 3-4 weeks.  Betsy Coder, MD  01/15/2019  11:17 AM

## 2019-01-21 ENCOUNTER — Other Ambulatory Visit: Payer: Self-pay | Admitting: Physician Assistant

## 2019-01-21 ENCOUNTER — Telehealth (INDEPENDENT_AMBULATORY_CARE_PROVIDER_SITE_OTHER): Payer: Medicare Other | Admitting: Physician Assistant

## 2019-01-21 ENCOUNTER — Encounter: Payer: Self-pay | Admitting: Physician Assistant

## 2019-01-21 ENCOUNTER — Telehealth: Payer: Self-pay

## 2019-01-21 VITALS — BP 135/68 | HR 70 | Ht 62.5 in | Wt 104.0 lb

## 2019-01-21 DIAGNOSIS — J449 Chronic obstructive pulmonary disease, unspecified: Secondary | ICD-10-CM

## 2019-01-21 DIAGNOSIS — E785 Hyperlipidemia, unspecified: Secondary | ICD-10-CM

## 2019-01-21 DIAGNOSIS — I2510 Atherosclerotic heart disease of native coronary artery without angina pectoris: Secondary | ICD-10-CM

## 2019-01-21 DIAGNOSIS — C3412 Malignant neoplasm of upper lobe, left bronchus or lung: Secondary | ICD-10-CM

## 2019-01-21 DIAGNOSIS — R002 Palpitations: Secondary | ICD-10-CM

## 2019-01-21 MED ORDER — ATORVASTATIN CALCIUM 20 MG PO TABS: 20.0000 mg | ORAL_TABLET | Freq: Every day | ORAL | 3 refills | Status: DC

## 2019-01-21 NOTE — Telephone Encounter (Signed)
Called patient went over her AVS. Patient verbalized an understanding all (if any) questions were answered. Patient informed AVS will be put in the mail.

## 2019-01-21 NOTE — Progress Notes (Signed)
Virtual Visit via Telephone Note   This visit type was conducted due to national recommendations for restrictions regarding the COVID-19 Pandemic (e.g. social distancing) in an effort to limit this patient's exposure and mitigate transmission in our community.  Due to her co-morbid illnesses, this patient is at least at moderate risk for complications without adequate follow up.  This format is felt to be most appropriate for this patient at this time.  The patient did not have access to video technology/had technical difficulties with video requiring transitioning to audio format only (telephone).  All issues noted in this document were discussed and addressed.  No physical exam could be performed with this format.  Please refer to the patient's chart for her  consent to telehealth for [facility].   Date:  01/21/2019   ID:  Brooke, [name] 1945-12-27, MRN [id number]  Patient Location: Home Provider Location: Home  PCP:  [name], MD  Cardiologist:  Previous patient of Dr. Mare Ferrari, plan to set up with Dr. Oval Linsey Electrophysiologist:  None   Evaluation Performed:  Follow-Up Visit  Chief Complaint: Annual follow-up  History of Present Illness:    Brooke [name] is a 73 y.o. female with PMH ofCOPD on home O2, minimal CAD on cath 06/2013, GERD, hyperlipidemia, LUL mass and former tobacco use. 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 It was possible that she had coronary vasospasm. The event leading up to the cardiac cath was that she lost her car keys while shopping and got into a panic episode. She has a history of leg cramps if she take Lipitor on a daily basis,  therefore she take Lipitor every other day instead.  She is on amlodipine for possible coronary spasm.   I first met the patient in April 2017, she wished to establish with Office due to closer proximity to her house.  She was diagnosed with left upper lobe lung mass, PET scan in October 2017 showed hypermetabolic left upper lobe mass.  She was referred for bronchoscopy in November 2017, however she declined treatment at the time.  CT of the chest in June 2018 showed enlarging left upper lobe mass with new splenic metastasis, new adenopathy at the pancreatic tail, upper abdomen and mid mediastinum. 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  She has completed cycle of chemotherapy with pembrolizumab on 09/17/2017.  She is currently on chemotherapy treated with pembrolizumab.  There has been no evidence of disease progression.  The treatment is currently on hold based on the most recent office visit by Dr. Learta Codding.  The plan is to do a restaging chest CT in 3 to 4 weeks.  Patient was contacted today via telephone visit.  She denies any recent chest pain or worsening shortness of breath.  She has no lower extremity edema, insomnia or PND.  She is no longer on amlodipine because of low blood pressure.  She does not have any recent exertional chest discomfort, therefore I am okay with her off of amlodipine at this time.  I did not start her on aspirin given the bleeding risk.  She will occasionally notice her  heart rate jump up to 120, however she says she usually have gas at the same time.  And as soon as she takes the Gas-X, her heart rate returned to normal.  There is a last about 5 minutes each time and may occur up to twice a month.  She is aware that if her tachypalpitations become more frequent, we will order a heart monitor to make sure she does not have A. fib.  Otherwise, she is doing well from a heart perspective and  can follow-up in 1 year.  The patient does not have symptoms concerning for COVID-19 infection (fever, chills, cough, or new shortness of breath).    Past Medical History:  Diagnosis Date  . Chronic respiratory failure (HCC)    a. on home O2.  Marland Kitchen COPD (chronic obstructive pulmonary disease) ([place])    a. Home O2.  . Former tobacco use   . GERD (gastroesophageal reflux disease)   . Hyperlipidemia   . Liver metastases ([place])   . lung ca 10/2016  . 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 NSTEMI (non-ST elevated myocardial infarction) ([place])    a. 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 Coronary vasospasm   Past Surgical History:  Procedure Laterality Date  . LEFT HEART CATHETERIZATION WITH CORONARY ANGIOGRAM N/A 06/08/2013   Procedure: LEFT HEART CATHETERIZATION WITH CORONARY ANGIOGRAM;  Surgeon: Sanda Klein, MD;  Location: [facility] CATH [facility];  Service: Cardiovascular;  Laterality: N/A;     Current Meds  Medication Sig  . acetaminophen (TYLENOL) 325 MG tablet Per bottle as needed  . albuterol (PROVENTIL HFA;VENTOLIN HFA) 108 (90 BASE) MCG/ACT inhaler Inhale 2 puffs into the lungs every 4 (four) hours as needed for wheezing or shortness of breath. **PLAN B**  . ALPRAZolam (XANAX) 0.25 MG tablet 1/2-1 twice daily as needed  . atorvastatin (LIPITOR) 20 MG tablet Take 1 tablet (20 mg total) by mouth daily at 6 PM.  . budesonide-formoterol (SYMBICORT) 160-4.5 MCG/ACT inhaler INHALE 2 PUFFS INTO THE LUNGS TWICE DAILY  . guaiFENesin (MUCINEX) 600 MG 12 hr tablet Take 600 mg by mouth 2 (two) times daily as needed for cough or to loosen phlegm.   Marland Kitchen ibuprofen (ADVIL,MOTRIN) 600 MG tablet Take 600 mg by mouth every 6 (six) hours as needed.  Marland Kitchen ipratropium-albuterol (DUONEB) 0.5-2.5 (3) MG/3ML SOLN Take 3 mLs by nebulization every 4 (four) hours as needed (wheezing/shortness of breath). **PLAN C**  . NON FORMULARY [facility]  Onychomycosis Nail  [place] -  Fluconazole 2%, Terbinafine 1% DMSO/undecylenic acid 25% Apply to affected nail once daily Qty. 120 gm 3 refills  . OXYGEN 2lpm 24/7  . Simethicone (GAS RELIEF) 180 MG CAPS Use as needed  . [DISCONTINUED] atorvastatin (LIPITOR) 20 MG tablet Take 1 tablet (20 mg total) by mouth daily at 6 PM. MUST KEEP APPOINTMENT 01/21/19 WITH Joyleen Haselton, PA FOR FUTURE REFILLS     Allergies:   Codeine, Imdur [isosorbide dinitrate], Prednisone, and Pulmicort [budesonide]   Social History   Tobacco Use  . Smoking status: Former Smoker    Packs/day: 1.00    Years: 30.00    Pack years: 30.00    Types: Cigarettes    Quit date: 05/08/2007    Years since quitting: 11.7  . Smokeless tobacco: Never Used  Substance Use Topics  . Alcohol use: Yes    Comment: rarely  . Drug use: No     Family Hx: The patient's family history includes  Lung cancer in her father. There is no history of Heart attack, Stroke, or Hypertension.  ROS:   Please see the history of present illness.     All other systems reviewed and are negative.   Prior CV studies:   The following studies were reviewed today:  Echo 06/06/2013 LV EF: 50% -  55%   Study Conclusions   Left ventricle: The cavity size was normal. Systolic  function was normal. The estimated ejection fraction was in  the range of 50% to 55%. Regional wall motion abnormalities  cannot be excluded. Doppler parameters are consistent with  abnormal left ventricular relaxation (grade 1 diastolic  dysfunction).     Labs/Other Tests and Data Reviewed:    EKG:  An ECG dated 10/22/2017 was personally reviewed today and demonstrated:  Normal sinus rhythm without significant ST-T wave changes.  Recent Labs: 12/23/2018: Hemoglobin 13.5; Platelet Count 241 01/15/2019: ALT 14; BUN 10; Creatinine 0.64; Potassium 4.2; Sodium 139; TSH 0.597   Recent Lipid Panel Lab Results  Component Value Date/Time   CHOL 166 08/18/2015 10:41 AM   TRIG 58 08/18/2015 10:41  AM   HDL 76 08/18/2015 10:41 AM   CHOLHDL 2.2 08/18/2015 10:41 AM   LDLCALC 78 08/18/2015 10:41 AM    Wt Readings from Last 3 Encounters:  01/21/19 104 lb (47.2 kg)  01/15/19 104 lb 8 oz (47.4 kg)  12/23/18 106 lb 3.2 oz (48.2 kg)     Objective:    Vital Signs:  BP 135/68   Pulse 70   Ht 5' 2.5" (1.588 m)   Wt 104 lb (47.2 kg)   SpO2 96%   BMI 18.72 kg/m    VITAL SIGNS:  reviewed  ASSESSMENT & PLAN:    1. CAD: Minimal disease on previous cardiac catheterization.  Denies any recent chest pain  2. Hyperlipidemia: On Lipitor 20 mg daily.  3. COPD: On oxygen at home  4. Lung cancer: Followed by Dr. Learta Codding, completed 35 rounds of pembrolizumab  5. Palpitation: She has occasional episodes where her heart rate increases, however she says this is treatable with Gas-X.  It is unclear to me what is the direct correlation between episodic tachycardia palpitation and the gas, I did advise her that if symptoms become more frequent, she will need to let us know and we can order a heart monitor. 6.   COVID-19 Education: The signs and symptoms of COVID-19 were discussed with the patient and how to seek care for testing (follow up with PCP or arrange E-visit).  The importance of social distancing was discussed today.  Time:   Today, I have spent 15 minutes with the patient with telehealth technology discussing the above problems.     Medication Adjustments/Labs and Tests Ordered: Current medicines are reviewed at length with the patient today.  Concerns regarding medicines are outlined above.   Tests Ordered: Orders Placed This Encounter  Procedures  . Lipid panel    Medication Changes: Meds ordered this encounter  Medications  . atorvastatin (LIPITOR) 20 MG tablet    Sig: Take 1 tablet (20 mg total) by mouth daily at 6 PM.    Dispense:  90 tablet    Refill:  3    Follow Up:  In Person in 1 year(s)  Signed, [name], [place]  01/21/2019 11:31 PM    [place]

## 2019-01-21 NOTE — Patient Instructions (Addendum)
Medication Instructions:  Your physician recommends that you continue on your current medications as directed. Please refer to the Current Medication list given to you today.   If you need a refill on your cardiac medications before your next appointment, please call your pharmacy.   Lab work: You will need to have lab (blood work) drawn. Can be done at your next appointment at the [facility]:  Fasting Lipid Panel-DO NOT EAT OR DRINK PAST MIDNIGHT  If you have labs (blood work) drawn today and your tests are completely normal, you will receive your results only by: Marland Kitchen MyChart Message (if you have MyChart) OR . A paper copy in the mail If you have any lab test that is abnormal or we need to change your treatment, we will call you to review the results.  Testing/Procedures: NONE ordered at this time of appointment   Follow-Up: At [facility], you and your health needs are our priority.  As part of our continuing mission to provide you with exceptional heart care, we have created designated Provider Care Teams.  These Care Teams include your primary Cardiologist (physician) and Advanced Practice Providers (APPs -  Physician Assistants and Nurse Practitioners) who all work together to provide you with the care you need, when you need it. You will need a follow up appointment in 12 months-September 2021.  Please call our office in July 2021 to schedule this appointment.  You may see Skeet Latch, MD or one of the following Advanced Practice Providers on your designated Care Team:   Kerin Ransom, PA-C Roby Lofts, [place] . Sande Rives, PA-C  Any Other Special Instructions Will Be Listed Below (If Applicable).

## 2019-02-03 ENCOUNTER — Other Ambulatory Visit: Payer: Medicare Other

## 2019-02-03 ENCOUNTER — Ambulatory Visit: Payer: Medicare Other | Admitting: Oncology

## 2019-02-03 ENCOUNTER — Ambulatory Visit: Payer: Medicare Other

## 2019-02-12 ENCOUNTER — Other Ambulatory Visit: Payer: Self-pay

## 2019-02-12 ENCOUNTER — Ambulatory Visit (HOSPITAL_COMMUNITY)
Admission: RE | Admit: 2019-02-12 | Discharge: 2019-02-12 | Disposition: A | Discharge status: Home / Self Care | Payer: Medicare Other | Source: Ambulatory Visit | Attending: Oncology | Admitting: Oncology

## 2019-02-12 DIAGNOSIS — C3412 Malignant neoplasm of upper lobe, left bronchus or lung: Secondary | ICD-10-CM | POA: Diagnosis not present

## 2019-02-12 MED ORDER — SODIUM CHLORIDE (PF) 0.9 % IJ SOLN
INTRAMUSCULAR | Status: AC
  Filled 2019-02-12: qty 50

## 2019-02-12 MED ORDER — IOHEXOL 300 MG/ML  SOLN
75.0000 mL | Freq: Once | INTRAMUSCULAR | Status: AC | PRN
  Administered 2019-02-12: 75 mL via INTRAVENOUS

## 2019-02-13 ENCOUNTER — Telehealth: Payer: Self-pay | Admitting: *Deleted

## 2019-02-13 NOTE — Telephone Encounter (Signed)
-----   Message from [name], MD sent at 02/13/2019  5:45 AM EDT ----- Please call patient, CT shows decrease in spleen lesion, no sign of disease progression, f/u as scheduled

## 2019-02-13 NOTE — Telephone Encounter (Signed)
Patient notified of CT results and to f/u as scheduled.

## 2019-02-17 ENCOUNTER — Other Ambulatory Visit: Payer: Self-pay

## 2019-02-17 ENCOUNTER — Telehealth: Payer: Self-pay | Admitting: Oncology

## 2019-02-17 ENCOUNTER — Encounter: Payer: Self-pay | Admitting: Nurse Practitioner

## 2019-02-17 ENCOUNTER — Inpatient Hospital Stay: Payer: Medicare Other | Attending: Oncology | Admitting: Nurse Practitioner

## 2019-02-17 VITALS — BP 152/82 | HR 82 | Temp 98.7°F | Resp 17 | Ht 62.5 in | Wt 100.5 lb

## 2019-02-17 DIAGNOSIS — Z79899 Other long term (current) drug therapy: Secondary | ICD-10-CM | POA: Diagnosis not present

## 2019-02-17 DIAGNOSIS — C7889 Secondary malignant neoplasm of other digestive organs: Secondary | ICD-10-CM | POA: Insufficient documentation

## 2019-02-17 DIAGNOSIS — R0600 Dyspnea, unspecified: Secondary | ICD-10-CM | POA: Diagnosis not present

## 2019-02-17 DIAGNOSIS — R634 Abnormal weight loss: Secondary | ICD-10-CM | POA: Insufficient documentation

## 2019-02-17 DIAGNOSIS — Z9981 Dependence on supplemental oxygen: Secondary | ICD-10-CM | POA: Insufficient documentation

## 2019-02-17 DIAGNOSIS — R21 Rash and other nonspecific skin eruption: Secondary | ICD-10-CM | POA: Insufficient documentation

## 2019-02-17 DIAGNOSIS — R590 Localized enlarged lymph nodes: Secondary | ICD-10-CM | POA: Insufficient documentation

## 2019-02-17 DIAGNOSIS — J440 Chronic obstructive pulmonary disease with acute lower respiratory infection: Secondary | ICD-10-CM | POA: Diagnosis not present

## 2019-02-17 DIAGNOSIS — K5900 Constipation, unspecified: Secondary | ICD-10-CM | POA: Diagnosis not present

## 2019-02-17 DIAGNOSIS — C3412 Malignant neoplasm of upper lobe, left bronchus or lung: Secondary | ICD-10-CM | POA: Diagnosis not present

## 2019-02-17 DIAGNOSIS — I252 Old myocardial infarction: Secondary | ICD-10-CM | POA: Insufficient documentation

## 2019-02-17 NOTE — Progress Notes (Addendum)
[facility] OFF[facility] PROGRESS NOTE   Diagnosis: Non-small cell lung cancer  INTERVAL HISTORY:   Ms. Christiano returns as scheduled.  Overall she feels well.  She reports a good appetite though has had some weight loss.  She does "laps" around her house every day.  She denies pain.  No nausea or vomiting.  She had an episode of constipation last week.  She took a stool softener with good results.  No rash.  Baseline dyspnea is stable to improved.  She occasionally expectorates a small amount of phlegm.  Objective:  Vital signs in last 24 hours:  Blood pressure (!) 152/82, pulse 82, temperature 98.7 F (37.1 C), resp. rate 17, height 5' 2.5" (1.588 m), weight 100 lb 8 oz (45.6 kg), SpO2 95 %.    HEENT: No thrush or ulcers. GI: Abdomen soft and nontender.  No hepatomegaly.  No splenomegaly. Vascular: No leg edema.  Calves soft and nontender. Neuro: Alert and oriented. Skin: Several small, flat erythematous skin lesions at the right upper back which appear to be resolving.   Lab Results:  Lab Results  Component Value Date   WBC 6.7 12/23/2018   HGB 13.5 12/23/2018   HCT 41.0 12/23/2018   MCV 87.0 12/23/2018   PLT 241 12/23/2018   NEUTROABS 5.0 12/23/2018    Imaging:  No results found.  Medications: I have reviewed the patient's current medications.  Assessment/Plan:  1. 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  PDL1 60%  Left lung radiation 11/08/2016 through 11/21/2016  CT chest 12/12/2016-reexpansion of left upper lobe with a decreased left upper lobe  mass, unchanged mediastinal adenopathy, progression of a splenic metastasis/pancreatic tail/gastrohepatic ligament metastasis, right lower lobe pneumonia  Cycle 1 Pembrolizumab 12/14/2016  Cycle 2 Pembrolizumab 01/03/2017  Cycle 3 Pembrolizumab 01/24/2017  Cycle 4 Pembrolizumab 02/12/2017  CT 03/05/2018-decrease in left upper lobe mass, mediastinal adenopathy, and splenic mass  Cycle 5 pembrolizumab 03/07/2017  Cycle 6 Pembrolizumab 04/02/2017  Cycle 7 pembrolizumab 04/25/2017  Cycle 8 Pembrolizumab 05/14/2017  Cycle 9 Pembrolizumab 06/06/2017  CT 06/20/2017-mild decrease in left upper lobe mass, mild increase in paramediastinal left upper lobe nodule-radiation change?, decreased splenic metastasis  Cycle 10 pembrolizumab 06/25/2017  Cycle 11 pembrolizumab 07/18/2017  Cycle 12 pembrolizumab 08/29/2017  Cycle 13 pembrolizumab 09/17/2017  Cycle 14 Pembrolizumab 10/10/2017  CT chest 10/24/2017-slight enlargement of small mediastinal lymph nodes, increased left upper lobe consolidation, decreased size of spleen lesion  Cycle 15 pembrolizumab 10/29/2017  Cycle 16 Pembrolizumab 11/21/2017  Cycle 17 Pembrolizumab 12/11/2017  Cycle 18 pembrolizumab 01/02/2018  Cycle 19 Pembrolizumab 01/21/2018  Cycle 20 Pembrolizumab 02/13/2018  CT chest 02/27/2018-decreased left paratracheal node, progressive consolidation/fibrosis in the left upper lung, decreased size of splenic mass  Cycle 21 pembrolizumab 03/04/2018  Cycle 22 Pembrolizumab 03/27/2018  Cycle 23 pembrolizumab 04/15/2018  Cycle 24 pembrolizumab 05/08/2018  Cycle 25 Pembrolizumab 05/27/2018  CT chest 06/16/2018-stable left upper lung radiation fibrosis with no evidence of local tumor recurrence. Decreased left paratracheal adenopathy. No new or progressive metastatic disease in the chest. Gastrohepatic ligament lymphadenopathy stable. Splenic metastasis decreased. T5 vertebral compression fracture with associated patchy  sclerosis and no discrete osseous lesion, new in  the interval.  Cycle 26 Pembrolizumab 06/19/2018  Cycle 27 pembrolizumab 07/08/2018  Cycle 28 pembrolizumab 07/31/2018  Cycle 29 pembrolizumab 08/19/2018  Cycle 30 Pembrolizumab 09/11/2018  Cycle 31 pembrolizumab 09/30/2018  Cycle 32 pembrolizumab 10/23/2018  CT chest 10/28/2018- left apical pleural-parenchymal opacity consistent with radiation scarring has become more confluent likely representing evolutionary change.  Continued further decrease in size of index left paratracheal node and splenic metastasis.  Gastrohepatic ligament lymph node not changed.  Cycle 33 Pembrolizumab 11/11/2018  Cycle 34 pembrolizumab 12/04/2018  Cycle 35 pembrolizumab 12/23/2018  CT chest 02/12/2019- posttreatment changes left upper lobe unchanged.  Continued decrease in size of splenic metastasis.  Stable appearance of left paratracheal lymph nodes.  Stable gastrohepatic adenopathy.  Incomplete visualization of retroperitoneal lymph nodes in the upper abdomen.   2. History of acute respiratory failure secondary to #1  3. Oxygen dependent COPD  4. History of a NSTEMI 2015  5. Right lung pneumonia on chest CT 12/12/2016-treated with Levaquin  6.Grade 2 rash possibly related to Pembrolizumab. Treatment held 08/06/2017.Improved 08/29/2017, treatment resumed, rash has resolved   Disposition: Ms. Lensing appears stable.  The recent chest CT shows continued decrease in the splenic lesion, no evidence of disease progression.  The plan is for the next CT scan at a 26-monthinterval.  She will return for a follow-up visit in 3 months.  She will contact the office in the interim with any problems.  Patient seen with Dr. SBenay Spice   LNed CardANP/GNP-BC   02/17/2019  10:51 AM This was a shared visit with LNed Card  The restaging CT reveals no evidence of disease gression.  She completed 2 years of pembrolizumab.  She will be followed with  observation.  BJulieanne Manson MD

## 2019-02-17 NOTE — Telephone Encounter (Signed)
Scheduled per 10/13 los, patient received after visit summary and calender.

## 2019-02-20 ENCOUNTER — Other Ambulatory Visit: Payer: Self-pay

## 2019-02-20 ENCOUNTER — Other Ambulatory Visit: Payer: Medicare Other

## 2019-02-20 ENCOUNTER — Inpatient Hospital Stay: Payer: Medicare Other

## 2019-02-21 LAB — LIPID PANEL
Chol/HDL Ratio: 2.2 ratio (ref 0.0–4.4)
Cholesterol, Total: 190 mg/dL (ref 100–199)
HDL: 87 mg/dL (ref >39)
LDL Chol Calc (NIH): 94 mg/dL (ref 0–99)
Triglycerides: 45 mg/dL (ref 0–149)
VLDL Cholesterol Cal: 9 mg/dL (ref 5–40)

## 2019-02-21 LAB — SPECIMEN STATUS REPORT

## 2019-02-26 NOTE — Progress Notes (Signed)
The patient has been notified of the result and verbalized understanding.  All questions (if any) were answered. Jacqulynn Cadet, [place] 02/26/2019 2:36 PM

## 2019-02-27 ENCOUNTER — Telehealth: Payer: Self-pay | Admitting: Oncology

## 2019-02-27 NOTE — Telephone Encounter (Signed)
Scheduled appt per 10/24 sch message - pt is aware of date and time

## 2019-03-02 ENCOUNTER — Telehealth: Payer: Self-pay | Admitting: *Deleted

## 2019-03-02 NOTE — Telephone Encounter (Signed)
NP with home health called to report patient's weight has decreased to 92 lb at her visit on 02/27/19. Her appetite is good--eats small frequent snacks. Started her on a Zpack X 5 days. Patient also called to report weight loss. She also adds that she is feeling very fatigued as well. Does admit to feeling a bit better since Zpack started. Denies any increase in her cough or a fever and no loss of taste or smell. Feels as if her CPOD/asthma is not under good control. Encourage her to reach out to Dr. Melvyn Novas sooner than her appointment on 1029 if necessary. Would be unlikely this is her cancer, since she just had a good CT scan 2 weeks ago. Pulse rate over weekend was 115-120, but now in 80's today. Sats have been over 90% on oxygen. She just wanted MD aware of how she is feeling.

## 2019-03-04 ENCOUNTER — Telehealth: Payer: Self-pay | Admitting: Internal Medicine

## 2019-03-04 NOTE — Telephone Encounter (Signed)
See other phone noted dated today

## 2019-03-04 NOTE — Telephone Encounter (Signed)
Left message for patient to call back. Patient is already scheduled for an in office visit with MW tomorrow.

## 2019-03-04 NOTE — Telephone Encounter (Signed)
Patient is scheduled for televisit with MW on tomorrow 10.29.2020.  Called to speak with patient - spoke with patient spouse who reported that patient is currently being evaluated by EMS (for her breathing/cough/fatigue).  Advised spouse we will call back after lunch to see if EMS was able to take care of her - spouse happy with this.  Will leave in triage.

## 2019-03-04 NOTE — Telephone Encounter (Signed)
Spoke with the pt  She states that she was evaluated by EMS and was advised that they could not hear a lot of air movement in her lungs and they told her to call and make a sooner appt here  Her covid screen was neg as these symptoms started approx 2 wks ago

## 2019-03-05 ENCOUNTER — Ambulatory Visit (INDEPENDENT_AMBULATORY_CARE_PROVIDER_SITE_OTHER): Payer: Medicare Other | Admitting: Internal Medicine

## 2019-03-05 ENCOUNTER — Other Ambulatory Visit: Payer: Self-pay

## 2019-03-05 ENCOUNTER — Ambulatory Visit (INDEPENDENT_AMBULATORY_CARE_PROVIDER_SITE_OTHER): Payer: Medicare Other

## 2019-03-05 ENCOUNTER — Encounter: Payer: Self-pay | Admitting: Internal Medicine

## 2019-03-05 VITALS — BP 136/90 | HR 90 | Temp 97.8°F | Ht 62.5 in | Wt 99.4 lb

## 2019-03-05 DIAGNOSIS — J9611 Chronic respiratory failure with hypoxia: Secondary | ICD-10-CM

## 2019-03-05 DIAGNOSIS — J449 Chronic obstructive pulmonary disease, unspecified: Secondary | ICD-10-CM | POA: Diagnosis not present

## 2019-03-05 DIAGNOSIS — R918 Other nonspecific abnormal finding of lung field: Secondary | ICD-10-CM

## 2019-03-05 MED ORDER — LEVOFLOXACIN 500 MG PO TABS: 500.0000 mg | ORAL_TABLET | Freq: Every day | ORAL | 0 refills | Status: DC

## 2019-03-05 MED ORDER — METHYLPREDNISOLONE ACETATE 80 MG/ML IJ SUSP
120.0000 mg | Freq: Once | INTRAMUSCULAR | Status: AC
  Administered 2019-03-05: 120 mg via INTRAMUSCULAR

## 2019-03-05 NOTE — Progress Notes (Signed)
Subjective:    Patient ID: Brooke [name], female   DOB: [birth year],    MRN: [id number]     Brief patient profile:  2 yowf quit smoking 2009 on 02 noct 2012 maint on symbicort 160 since aorund  2014 referred to pulmonary clinic 02/15/2016 by Dr   Kathryne Eriksson for RUL Lung mass dx with Stage IV nsc 11/01/16 in setting of GOLD IV copd      History of Present Illness  02/15/2016 1st [facility] office visit/ Brooke [name]  GOLD IV copd/ symb 160 2bid maint  Chief Complaint  Patient presents with  . Pulmonary Consult    referred by Dr. Kathryne Eriksson for COPD.  MMRC2 = can't walk a nl pace on a flat grade s sob but does fine slow and flat eg [employer] on 2lpm  New onset bloody mucus plugs November 11 2015 assoc with wt loss  02 2lpm hs and with walking / rare saba needed rec For cough > mucinex up to 1200 mg every 12 hours as needed Rec:  Fob but refused   11/01/16   CT directed bx >>>  Pos NSC LUL    Diagnosis:   Stage IV NSCLC, Squamous cell carcinoma of the left upper lobe.     Indication for treatment:  palliative       Radiation treatment dates:   11/08/2016 to 11/21/2016  Site/dose:   The Left lung was treated to 30 Gy in 10 fractions at 3 Gy per fraction.   Beams/energy:   3D // 10X, 6X    12/06/2016  f/u ov/Brooke [name] re: post hosp f/u -  Now on dulera 200 bid and duoneb Chief Complaint  Patient presents with  . HFU    Breathing is doing well. She is using Duoneb 2 x daily and uses albuterol inhaler 1-2 x per wk on average.   presently in SNF ever since last admit 6/18-29/18 for  Active Problems:   COPD  GOLD IV    Mass of upper lobe of left lung   Chronic respiratory failure with hypoxia (HCC)   COPD exacerbation (HCC)   Hypokalemia   Microcytic anemia   SOB (shortness of breath)   Protein-calorie malnutrition, severe Doe = 2lpm x 167ft with rolling walking  Sleeping ok at < 30 degrees Doe = 2lpm x 120ft with rolling walker/2lpm Sleeping ok at < 30 degrees   rec 02 can be adjusted down and off for saturations above 90%       12/09/2017  f/u ov/Brooke [name] re: GOLD IV copd/  No 02 at rest/ 2lpm sleep and with activity  Chief Complaint  Patient presents with  . Follow-up    Breathing is overall doing well. She has occ cough with white sputum- relates to PND.  She uses her albuterol inhaler 2 x daily on average. She states she uses neb before she knows she is going somewhere.   Dyspnea:  MMRC2 = can't walk a nl pace on a flat grade s sob but does fine slow and flat eg still pushing cart  Cough: min pnd  SABA use: as above  rec Continue  symbicort and add stiolto 2 pffs each am - - if you don't like it for any reason resume the symbicort alone as per your med calendar  Please schedule a follow up visit in 3 months but call sooner if needed  - add:  Pt notified above typo should have said add spiriva x 2 pffs each am  and continue symbicort Take 2 puffs first thing in am and then another 2 puffs about 12 hours later.   - 12/12/2017 informed could not tol spiriva due to dry mouth   03/14/2018  f/u ov/Brooke [name] re:  GOLD IV / 02 is 2lpm  Hs and 2lpm exertion/   RA sitting / just on symbicort 160 2bid  Chief Complaint  Patient presents with  . Follow-up    increased cough with white sputum. She states her breathing has been doing well. She is using her albuterol inhaler 2 x per wk and neb maybe 2 x  per wk on average.    Dyspnea:  [employer] ok pushing cart on 2lpm  Cough: more since 2 weeks  Sleeping: flat bed/ 2-3 pillow  SABA use: avg as above 02: as above   rec Work on inhaler technique:   Ok to take augmentin unless it makes you nauseated  Please schedule a follow up visit in 3 months but call sooner if needed      06/13/2018  f/u ov/Brooke [name] re: GOLD IV / 02 hs and prn  Chief Complaint  Patient presents with  . Shortness of Breath    Worse since the last visit.  Dyspnea:  Still doing [employer]  On 2lpm but not checking sats as rec with activity   Cough: dry worse day than noct Sleeping: falt bed / couple of pillows SABA use: helps some / uses once a day esp in am 02: 2lpm hs and prn  rec Plan A = Automatic = symbicort 160 Take 2 puffs first thing in am and then another 2 puffs about 12 hours later.  Work on inhaler technique: Plan B = Backup Only use your albuterol inhaler as a rescue medication Plan C = Crisis - only use your albuterol/ipatropium nebulizer if you first try Plan B and it fails to help > ok to use the nebulizer up to every 4 hours but if start needing it regularly call for immediate appointment Please schedule a follow up visit in 3 months but call sooner if needed  - add chart review does not show alpha one screen done    03/05/2019 acute ext ov/Brooke [name] re:  GOLD IV / 02 dep  Chief Complaint  Patient presents with  . Acute Visit    Increased cough and SOB for the past 2 wks. She has already been treated with zpack. She is using her albuterol inhaler several x per day and neb a couple times per day.   Dyspnea:  Getting worse x months but esp x 2 weeks  Cough: yellow mucus x 2 weeks and no change p zpak  Sleeping: bed is flat, sev pillows SABA use: some better  02: 2lpm 24/7    No obvious day to day or daytime variability or assoc  mucus plugs or hemoptysis or cp or chest tightness, subjective wheeze or overt sinus or hb symptoms.   Sleeping  without nocturnal  or early am exacerbation  of respiratory  c/o's or need for noct saba. Also denies any obvious fluctuation of symptoms with weather or environmental changes or other aggravating or alleviating factors except as outlined above   No unusual exposure hx or h/o childhood pna/ asthma or knowledge of premature birth.  Current Allergies, Complete Past Medical History, Past Surgical History, Family History, and Social History were reviewed in [employer] record.  ROS  The following are not active complaints unless bolded Hoarseness, sore  throat,  dysphagia, dental problems, itching, sneezing,  nasal congestion or discharge of excess mucus or purulent secretions, ear ache,   fever, chills, sweats, unintended wt loss or wt gain, classically pleuritic or exertional cp,  orthopnea pnd or arm/hand swelling  or leg swelling, presyncope, palpitations, abdominal pain, anorexia, nausea, vomiting, diarrhea  or change in bowel habits or change in bladder habits, change in stools or change in urine, dysuria, hematuria,  rash, arthralgias, visual complaints, headache, numbness, weakness or ataxia or problems with walking or coordination,  change in mood or  memory.        Current Meds  Medication Sig  . acetaminophen (TYLENOL) 325 MG tablet Per bottle as needed  . albuterol (PROVENTIL HFA;VENTOLIN HFA) 108 (90 BASE) MCG/ACT inhaler Inhale 2 puffs into the lungs every 4 (four) hours as needed for wheezing or shortness of breath. **PLAN B**  . ALPRAZolam (XANAX) 0.25 MG tablet 1/2-1 twice daily as needed  . atorvastatin (LIPITOR) 20 MG tablet Take 1 tablet (20 mg total) by mouth daily at 6 PM.  . budesonide-formoterol (SYMBICORT) 160-4.5 MCG/ACT inhaler INHALE 2 PUFFS INTO THE LUNGS TWICE DAILY  . guaiFENesin (MUCINEX) 600 MG 12 hr tablet Take 600 mg by mouth 2 (two) times daily as needed for cough or to loosen phlegm.   Marland Kitchen ibuprofen (ADVIL,MOTRIN) 600 MG tablet Take 600 mg by mouth every 6 (six) hours as needed.  Marland Kitchen ipratropium-albuterol (DUONEB) 0.5-2.5 (3) MG/3ML SOLN Take 3 mLs by nebulization every 4 (four) hours as needed (wheezing/shortness of breath). **PLAN C**  . nitroGLYCERIN (NITROSTAT) 0.4 MG SL tablet ONE TABLET UNDER TONGUE AS NEEDED FOR CHEST PAIN EVERY 5 MINUTES FOR 3 DOSES  . NON FORMULARY [facility]  Onychomycosis Nail Lacquer -  Fluconazole 2%, Terbinafine 1% DMSO/undecylenic acid 25% Apply to affected nail once daily Qty. 120 gm 3 refills  . OXYGEN 2lpm 24/7  . Simethicone (GAS RELIEF) 180 MG CAPS Use as needed               Objective:   Physical Exam  wc bound elderly wf  4 h p last neb rx    03/05/2019      99  06/13/2018         123  03/14/2018       127  12/09/2017         125   06/10/2017         119  01/08/2017         112   12/06/2016        110   02/15/16 119 lb (54 kg)  08/18/15 137 lb 12.8 oz (62.5 kg)  02/17/15 138 lb 1.9 oz (62.7 kg)      Vital signs reviewed - Note on arrival 02 sats  99% on 2lpm pulsed      HEENT : pt wearing mask not removed for exam due to covid -19 concerns.    NECK :  without JVD/Nodes/TM/ nl carotid upstrokes bilaterally   LUNGS: no acc muscle use,  Mod barrel  contour chest wall with bilateral  Distant bs s audible wheeze and  without cough on insp or exp maneuvers and mod  Hyperresonant  to  percussion bilaterally     CV:  RRR  no s3 or murmur or increase in P2, and no edema   ABD:  soft and nontender with pos mid insp Hoover's  in the supine position. No bruits or organomegaly appreciated, bowel sounds nl  MS:  ext warm without deformities, calf tenderness, cyanosis or clubbing No obvious joint restrictions   SKIN: warm and dry without lesions    NEURO:  alert, approp, nl sensorium with  no motor or cerebellar deficits apparent.     CXR PA and Lateral:   03/05/2019 :    I personally reviewed images and agree with radiology impression as follows:   No acute cardiopulmonary disease identified. Chronic change of left upper lobe stable.        Assessment:

## 2019-03-05 NOTE — Patient Instructions (Addendum)
Depomedrol 120 mg IM today   Levaquin 500 mg daily x 7 days   Please remember to go to the  x-ray department  for your tests - we will call you with the results when they are available     Please schedule a follow up office visit in 4 weeks, sooner if needed

## 2019-03-05 NOTE — Progress Notes (Signed)
Spoke with the pt and notified of her results  She verbalized understanding  She is asking why she is so "exhausted" and wants to know if Dr Melvyn Novas still wants her to take levaquin  I advised that she should take med as instructed on AVS bc the above result note states no change in rec made at ov  She states she does not understand why she needs to take abx if she has no PNA bc med has many side effects that she does not like

## 2019-03-06 ENCOUNTER — Encounter: Payer: Self-pay | Admitting: Internal Medicine

## 2019-03-06 ENCOUNTER — Ambulatory Visit: Payer: Medicare Other | Admitting: Internal Medicine

## 2019-03-06 NOTE — Assessment & Plan Note (Signed)
rx 2lpm hs and prn daytime since 2014  -  06/13/2018   Walked on 2lpm x  3 laps @  approx 231ft each @ nl pace  stopped due to  Sob after each lap  s desat     rec as of 03/05/2019  = 2lpm hs and prn with activity with goal of keeping sats > 90% but needs to learn to pace herself on her own or consider option of pulmonary rehab   Adequate control on present rx, reviewed in detail with pt > no change in rx needed     I had an extended discussion with the patient reviewing all relevant studies completed to date and  lasting 15 to 20 minutes of a 25 minute acute office visit    Each maintenance medication was reviewed in detail including most importantly the difference between maintenance and prns and under what circumstances the prns are to be triggered using an action plan format that is not reflected in the computer generated alphabetically organized AVS.     Please see AVS for specific instructions unique to this visit that I personally wrote and verbalized to the the pt in detail and then reviewed with pt  by my nurse highlighting any  changes in therapy recommended at today's visit to their plan of care.

## 2019-03-06 NOTE — Assessment & Plan Note (Signed)
Quit smoking 2009 Spirometry 02/15/2016  FEV1 0.60 (28%)  Ratio 38 with classic curvature  p am symbicort 160  - 01/08/2017    continue dulera 200 2bid  - 12/09/2017  After extensive coaching inhaler device  effectiveness =    90% with smi >add spiriva to  symbicort  - 12/12/2017 informed could not tol spiriva due to dry mouth - - 06/13/2018  After extensive coaching inhaler device,  effectiveness =    75%    Acute deterioration with purulent tracheobronchitis but not much wheeze c/w mod exac refractory to zpak so rec levaquin 500 mg daily x 7 days and depomedrol 120 IM as she does poorly with prednisone

## 2019-03-06 NOTE — Assessment & Plan Note (Signed)
See CT Chest 01/26/16 c/w ? Stage  lung ca arising in LUL - PET 02/28/16 1. Large lobular hypermetabolic mass in the LEFT upper lobe abutting the oblique fissure consists with primary bronchogenic carcinoma. 2. Evidence of ipsilateral hypermetabolic mediastinal nodal Metastasis only > 02/29/2016  referred for EBUS/ Dr [place] Bells > declined  -11/01/16   CT directed bx  Pos [place] LUL  > RT palliative completed 11/21/16 / rx per oncology = Keytruda  - CT 02/27/18 fibrosis of LUL / nodes smaller - CT 02/12/2019 Post treatment changes in left upper lobe unchanged from previous Exam. Continued decrease in size of splenic metastasis. Stable appearance of left paratracheal lymph nodes. Stable gastrohepatic adenopathy with incomplete visualization of retroperitoneal lymph nodes in the upper abdomen.  Reviewed most recent ct/ encouraged by response to rx but note wt trending down with f/u by PCP planned / nothing to add at this point from pulmonary perspective

## 2019-03-15 ENCOUNTER — Other Ambulatory Visit: Payer: Self-pay | Admitting: Physician Assistant

## 2019-03-31 ENCOUNTER — Inpatient Hospital Stay: Payer: Medicare Other | Attending: Oncology | Admitting: Nurse Practitioner

## 2019-03-31 ENCOUNTER — Other Ambulatory Visit: Payer: Self-pay

## 2019-03-31 ENCOUNTER — Encounter: Payer: Self-pay | Admitting: Nurse Practitioner

## 2019-03-31 VITALS — BP 146/91 | HR 79 | Temp 98.3°F | Resp 17 | Ht 62.5 in | Wt 100.5 lb

## 2019-03-31 DIAGNOSIS — Z9981 Dependence on supplemental oxygen: Secondary | ICD-10-CM | POA: Insufficient documentation

## 2019-03-31 DIAGNOSIS — R590 Localized enlarged lymph nodes: Secondary | ICD-10-CM | POA: Diagnosis not present

## 2019-03-31 DIAGNOSIS — R5383 Other fatigue: Secondary | ICD-10-CM | POA: Diagnosis not present

## 2019-03-31 DIAGNOSIS — I252 Old myocardial infarction: Secondary | ICD-10-CM | POA: Insufficient documentation

## 2019-03-31 DIAGNOSIS — Z79899 Other long term (current) drug therapy: Secondary | ICD-10-CM | POA: Diagnosis not present

## 2019-03-31 DIAGNOSIS — C3412 Malignant neoplasm of upper lobe, left bronchus or lung: Secondary | ICD-10-CM

## 2019-03-31 DIAGNOSIS — J449 Chronic obstructive pulmonary disease, unspecified: Secondary | ICD-10-CM | POA: Diagnosis not present

## 2019-03-31 DIAGNOSIS — C7889 Secondary malignant neoplasm of other digestive organs: Secondary | ICD-10-CM | POA: Insufficient documentation

## 2019-03-31 NOTE — Progress Notes (Signed)
[place] OFFICE PROGRESS NOTE   Diagnosis: Non-small cell lung cancer  INTERVAL HISTORY:   Brooke [name] returns as scheduled.  She reports improvement in her overall condition since completing treatment for pneumonia several weeks ago.  Specifically she noted improvement in fatigue/energy.  She has a good appetite.  She denies fever.  Stable dyspnea on exertion.  No significant cough.  She reports a change in her voice over the past few weeks after "screaming" at someone on the phone.  Objective:  Vital signs in last 24 hours:  Blood pressure (!) 146/91, pulse 79, temperature 98.3 F (36.8 C), temperature source Temporal, resp. rate 17, height 5' 2.5" (1.588 m), weight 100 lb 8 oz (45.6 kg), SpO2 100 %.    Resp: Bronchial breath sounds.  Good air movement bilaterally.  No respiratory distress. Cardio: Regular rate and rhythm. GI: Abdomen soft and nontender.  No hepatomegaly. Vascular: No leg edema. Neuro: Alert and oriented. Skin: No rash.   Lab Results:  Lab Results  Component Value Date   WBC 6.7 12/23/2018   HGB 13.5 12/23/2018   HCT 41.0 12/23/2018   MCV 87.0 12/23/2018   PLT 241 12/23/2018   NEUTROABS 5.0 12/23/2018    Imaging:  No results found.  Medications: I have reviewed the patient's current medications.  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  PDL1 60%  Left lung radiation 11/08/2016 through 11/21/2016  CT chest 12/12/2016-reexpansion of left upper lobe with a decreased left upper lobe mass, unchanged  mediastinal adenopathy, progression of a splenic metastasis/pancreatic tail/gastrohepatic ligament metastasis, right lower lobe pneumonia  Cycle 1 Pembrolizumab 12/14/2016  Cycle 2 Pembrolizumab 01/03/2017  Cycle 3 Pembrolizumab 01/24/2017  Cycle 4 Pembrolizumab 02/12/2017  CT 03/05/2018-decrease in left upper lobe mass, mediastinal adenopathy, and splenic mass  Cycle 5 pembrolizumab 03/07/2017  Cycle 6 Pembrolizumab 04/02/2017  Cycle 7 pembrolizumab 04/25/2017  Cycle 8 Pembrolizumab 05/14/2017  Cycle 9 Pembrolizumab 06/06/2017  CT 06/20/2017-mild decrease in left upper lobe mass, mild increase in paramediastinal left upper lobe nodule-radiation change?, decreased splenic metastasis  Cycle 10 pembrolizumab 06/25/2017  Cycle 11 pembrolizumab 07/18/2017  Cycle 12 pembrolizumab 08/29/2017  Cycle 13 pembrolizumab 09/17/2017  Cycle 14 Pembrolizumab 10/10/2017  CT chest 10/24/2017-slight enlargement of small mediastinal lymph nodes, increased left upper lobe consolidation, decreased size of spleen lesion  Cycle 15 pembrolizumab 10/29/2017  Cycle 16 Pembrolizumab 11/21/2017  Cycle 17 Pembrolizumab 12/11/2017  Cycle 18 pembrolizumab 01/02/2018  Cycle 19 Pembrolizumab 01/21/2018  Cycle 20 Pembrolizumab 02/13/2018  CT chest 02/27/2018-decreased left paratracheal node, progressive consolidation/fibrosis in the left upper lung, decreased size of splenic mass  Cycle 21 pembrolizumab 03/04/2018  Cycle 22 Pembrolizumab 03/27/2018  Cycle 23 pembrolizumab 04/15/2018  Cycle 24 pembrolizumab 05/08/2018  Cycle 25 Pembrolizumab 05/27/2018  CT chest 06/16/2018-stable left upper lung radiation fibrosis with no evidence of local tumor recurrence. Decreased left paratracheal adenopathy. No new or progressive metastatic disease in the chest. Gastrohepatic ligament lymphadenopathy stable. Splenic metastasis decreased. T5 vertebral compression fracture with associated patchy sclerosis and no  discrete osseous lesion, new in the interval.  Cycle 26 Pembrolizumab 06/19/2018  Cycle 27 pembrolizumab 07/08/2018  Cycle 28 pembrolizumab 07/31/2018  Cycle 29 pembrolizumab 08/19/2018  Cycle 30 Pembrolizumab 09/11/2018  Cycle 31 pembrolizumab 09/30/2018  Cycle 32 pembrolizumab 10/23/2018  CT chest 10/28/2018-left apical pleural-parenchymal opacity consistent with radiation scarring has become more confluent likely representing evolutionary change. Continued further decrease in size of index left paratracheal node and splenic metastasis. Gastrohepatic ligament lymph node not changed.  Cycle 33 Pembrolizumab 11/11/2018  Cycle 34 pembrolizumab 12/04/2018  Cycle 35 pembrolizumab 12/23/2018  CT chest 02/12/2019- posttreatment changes left upper lobe unchanged.  Continued decrease in size of splenic metastasis.  Stable appearance of left paratracheal lymph nodes.  Stable gastrohepatic adenopathy.  Incomplete visualization of retroperitoneal lymph nodes in the upper abdomen.   2. History of acute respiratory failure secondary to #1  3. Oxygen dependent COPD  4. History of a NSTEMI 2015  5. Right lung pneumonia on chest CT 12/12/2016-treated with Levaquin  6.Grade 2 rash possibly related to Pembrolizumab. Treatment held 08/06/2017.Improved 08/29/2017, treatment resumed, rash has resolved  Disposition: Brooke [name] appears stable.  There is no clinical evidence of disease progression.  Weight has stabilized/improved since completing treatment for pneumonia.  Plan to continue to follow with observation.  She will return for a follow-up visit in 6 weeks.  She will contact the office in the interim with any problems.  She will contact the office if the hoarseness persists/worsens.  Plan reviewed with [name].  [name] ANP/GNP-BC   03/31/2019  10:32 AM

## 2019-04-06 ENCOUNTER — Other Ambulatory Visit: Payer: Self-pay

## 2019-04-06 ENCOUNTER — Ambulatory Visit (INDEPENDENT_AMBULATORY_CARE_PROVIDER_SITE_OTHER): Payer: Medicare Other | Admitting: Internal Medicine

## 2019-04-06 DIAGNOSIS — J449 Chronic obstructive pulmonary disease, unspecified: Secondary | ICD-10-CM | POA: Diagnosis not present

## 2019-04-06 DIAGNOSIS — R490 Dysphonia: Secondary | ICD-10-CM

## 2019-04-06 DIAGNOSIS — J9611 Chronic respiratory failure with hypoxia: Secondary | ICD-10-CM

## 2019-04-06 NOTE — Patient Instructions (Addendum)
No change in medications but ok to leave off 02 at rest if saturations are over 90%   GERD (REFLUX)  is an extremely common cause of respiratory symptoms just like yours , many times with no obvious heartburn at all.    It can be treated with medication, but also with lifestyle changes including elevation of the head of your bed (ideally with 6 -8inch blocks under the headboard of your bed),  Smoking cessation, avoidance of late meals, excessive alcohol, and avoid fatty foods, chocolate, peppermint, colas, red wine, and acidic juices such as orange juice.  NO MINT OR MENTHOL PRODUCTS SO NO COUGH DROPS  USE SUGARLESS CANDY INSTEAD (Jolley ranchers or Stover's or Life Savers) or even ice chips will also do - the key is to swallow to prevent all throat clearing. NO OIL BASED VITAMINS - use powdered substitutes.  Avoid fish oil when coughing. >>>  If not improving we will need to send you to ENT   Please schedule a follow up visit in 6 months but call sooner if needed

## 2019-04-06 NOTE — Progress Notes (Signed)
Subjective:    Patient ID: Brooke [name], female   DOB: Feb 25, 1946,    MRN: [id number]     Brief patient profile:  14 yowf quit smoking 2009 on 02 noct 2012 maint on symbicort 160 since aorund  2014 referred to pulmonary clinic 02/15/2016 by Dr   Kathryne Eriksson for RUL Lung mass dx with Stage IV nsc 11/01/16 in setting of GOLD IV copd      History of Present Illness  02/15/2016 1st [facility] office visit/ Brooke [name]  GOLD IV copd/ symb 160 2bid maint  Chief Complaint  Patient presents with  . Pulmonary Consult    referred by Dr. Kathryne Eriksson for COPD.  MMRC2 = can't walk a nl pace on a flat grade s sob but does fine slow and flat eg [employer]/ [employer] on 2lpm  New onset bloody mucus plugs November 11 2015 assoc with wt loss  02 2lpm hs and with walking / rare saba needed rec For cough > mucinex up to 1200 mg every 12 hours as needed Rec:  Fob but refused   11/01/16   CT directed bx >>>  Pos NSC LUL    Diagnosis:   Stage IV NSCLC, Squamous cell carcinoma of the left upper lobe.     Indication for treatment:  palliative       Radiation treatment dates:   11/08/2016 to 11/21/2016  Site/dose:   The Left lung was treated to 30 Gy in 10 fractions at 3 Gy per fraction.   Beams/energy:   3D // 10X, 6X    12/06/2016  f/u ov/Brooke [name] re: post hosp f/u -  Now on dulera 200 bid and duoneb Chief Complaint  Patient presents with  . HFU    Breathing is doing well. She is using Duoneb 2 x daily and uses albuterol inhaler 1-2 x per wk on average.   presently in SNF ever since last admit 6/18-29/18 for  Active Problems:   COPD  GOLD IV    Mass of upper lobe of left lung   Chronic respiratory failure with hypoxia (HCC)   COPD exacerbation (HCC)   Hypokalemia   Microcytic anemia   SOB (shortness of breath)   Protein-calorie malnutrition, severe Doe = 2lpm x 155ft with rolling walking  Sleeping ok at < 30 degrees Doe = 2lpm x 14ft with rolling walker/2lpm Sleeping ok at < 30 degrees   rec 02 can be adjusted down and off for saturations above 90%       12/09/2017  f/u ov/Brooke [name] re: GOLD IV copd/  No 02 at rest/ 2lpm sleep and with activity  Chief Complaint  Patient presents with  . Follow-up    Breathing is overall doing well. She has occ cough with white sputum- relates to PND.  She uses her albuterol inhaler 2 x daily on average. She states she uses neb before she knows she is going somewhere.   Dyspnea:  MMRC2 = can't walk a nl pace on a flat grade s sob but does fine slow and flat eg still pushing cart  Cough: min pnd  SABA use: as above  rec Continue  symbicort and add stiolto 2 pffs each am - - if you don't like it for any reason resume the symbicort alone as per your med calendar  Please schedule a follow up visit in 3 months but call sooner if needed  - add:  Pt notified above typo should have said add spiriva x 2 pffs each am  and continue symbicort Take 2 puffs first thing in am and then another 2 puffs about 12 hours later.   - 12/12/2017 informed could not tol spiriva due to dry mouth   03/14/2018  f/u ov/Brooke [name] re:  GOLD IV / 02 is 2lpm  Hs and 2lpm exertion/   RA sitting / just on symbicort 160 2bid  Chief Complaint  Patient presents with  . Follow-up    increased cough with white sputum. She states her breathing has been doing well. She is using her albuterol inhaler 2 x per wk and neb maybe 2 x  per wk on average.    Dyspnea:  [employer] ok pushing cart on 2lpm  Cough: more since 2 weeks  Sleeping: flat bed/ 2-3 pillow  SABA use: avg as above 02: as above   rec Work on inhaler technique:   Ok to take augmentin unless it makes you nauseated  Please schedule a follow up visit in 3 months but call sooner if needed      06/13/2018  f/u ov/Brooke [name] re: GOLD IV / 02 hs and prn  Chief Complaint  Patient presents with  . Shortness of Breath    Worse since the last visit.  Dyspnea:  Still doing [employer]  On 2lpm but not checking sats as rec with activity   Cough: dry worse day than noct Sleeping: falt bed / couple of pillows SABA use: helps some / uses once a day esp in am 02: 2lpm hs and prn  rec Plan A = Automatic = symbicort 160 Take 2 puffs first thing in am and then another 2 puffs about 12 hours later.  Work on inhaler technique: Plan B = Backup Only use your albuterol inhaler as a rescue medication Plan C = Crisis - only use your albuterol/ipatropium nebulizer if you first try Plan B and it fails to help > ok to use the nebulizer up to every 4 hours but if start needing it regularly call for immediate appointment Please schedule a follow up visit in 3 months but call sooner if needed  - add chart review does not show alpha one screen done    03/05/2019 acute ext ov/Brooke [name] re:  GOLD IV / 02 dep  Chief Complaint  Patient presents with  . Acute Visit    Increased cough and SOB for the past 2 wks. She has already been treated with zpack. She is using her albuterol inhaler several x per day and neb a couple times per day.   Dyspnea:  Getting worse x months but esp x 2 weeks  Cough: yellow mucus x 2 weeks and no change p zpak  Sleeping: bed is flat, sev pillows SABA use: some better  02: 2lpm 24/7  rec Depomedrol 120 mg IM today  Levaquin 500 mg daily x 7 days    Virtual Visit via Telephone Note 04/06/2019  Re GOLD IV sp aecopd  I connected with Brooke [name] on 04/06/19 at 925  AM EST by telephone and verified that I am speaking with the correct person using two identifiers.   I discussed the limitations, risks, security and privacy concerns of performing an evaluation and management service by telephone and the availability of in person appointments. I also discussed with the patient that there may be a patient responsible charge related to this service. The patient expressed understanding and agreed to proceed.   History of Present Illness: Dyspnea:  Much improved p depo 120 mg / levaquin  last ov Cough: minimal though  hoarse p yelling and Dr Ammie Dalton considering ent refer  Sleeping: flat bed, 2 pillows  SABA use: sev times p activity  02: 2lpm hs,  With activity 2lpm   At rest Not using but not montoring    No obvious day to day or daytime variability or assoc excess/ purulent sputum or mucus plugs or hemoptysis or cp or chest tightness, subjective wheeze or overt sinus or hb symptoms.    Also denies any obvious fluctuation of symptoms with weather or environmental changes or other aggravating or alleviating factors except as outlined above.   Meds reviewed/ med reconciliation completed        Observations/Objective: Very hoarse but no increased wob    Assessment and Plan: See problem list for active a/p's   Follow Up Instructions: See avs for instructions unique to this ov which includes revised/ updated med list     I discussed the assessment and treatment plan with the patient. The patient was provided an opportunity to ask questions and all were answered. The patient agreed with the plan and demonstrated an understanding of the instructions.   The patient was advised to call back or seek an in-person evaluation if the symptoms worsen or if the condition fails to improve as anticipated.  I provided 25 minutes of non-face-to-face time during this encounter.   Christinia Gully, MD

## 2019-04-07 ENCOUNTER — Encounter: Payer: Self-pay | Admitting: Internal Medicine

## 2019-04-07 DIAGNOSIS — R490 Dysphonia: Secondary | ICD-10-CM | POA: Insufficient documentation

## 2019-04-07 NOTE — Assessment & Plan Note (Addendum)
Advised re gerd diet/ using hard rock candy to clear throat by swallowing and f/u ENT if not improving as ? Could she have recurrent laryngeal nerve involvement assoc with LUL lung ca ?

## 2019-04-07 NOTE — Assessment & Plan Note (Signed)
Quit smoking 2009 Spirometry 02/15/2016  FEV1 0.60 (28%)  Ratio 38 with classic curvature  p am symbicort 160  - 01/08/2017    continue dulera 200 2bid  - 12/09/2017  After extensive coaching inhaler device  effectiveness =    90% with smi >add spiriva to  symbicort  - 12/12/2017 informed could not tol spiriva due to dry mouth - - 06/13/2018  After extensive coaching inhaler device,  effectiveness =    75% > continue symbicort 160  Adequate control on present rx, reviewed in detail with pt > no change in rx needed

## 2019-04-07 NOTE — Assessment & Plan Note (Signed)
rx 2lpm hs and prn daytime since 2014  -  06/13/2018   Walked on 2lpm x  3 laps @  approx 214ft each @ nl pace  stopped due to  Sob after each lap  s desat     rec as of 04/06/2019  = 2lpm hs and prn with activity with goal of keeping sats > 90% but needs to learn to pace herself on her own or consider option of pulmonary rehab  Reviewed strategy of rest vs ex 02 needs being different and should self titrate   Each maintenance medication was reviewed in detail including most importantly the difference between maintenance and as needed and under what circumstances the prns are to be used.  Please see AVS for specific  Instructions which are unique to this visit and I personally typed out  which were reviewed in detail over the phone with the patient and a copy provided by mail

## 2019-05-19 ENCOUNTER — Telehealth: Payer: Self-pay | Admitting: *Deleted

## 2019-05-19 ENCOUNTER — Telehealth: Payer: Self-pay | Admitting: Oncology

## 2019-05-19 NOTE — Telephone Encounter (Signed)
Returned patient's phone call regarding 01/14 appointment, per patient's request this will be a phone visit.

## 2019-05-19 NOTE — Telephone Encounter (Signed)
OK to change visit. Message to scheduler

## 2019-05-19 NOTE — Telephone Encounter (Signed)
Brooke [name] is requesting 05/21/19 appt with Dr Benay Spice be changed to a  Virtual or telephone visit.

## 2019-05-21 ENCOUNTER — Inpatient Hospital Stay: Payer: Medicare Other | Attending: Oncology | Admitting: Oncology

## 2019-05-21 DIAGNOSIS — C3412 Malignant neoplasm of upper lobe, left bronchus or lung: Secondary | ICD-10-CM | POA: Diagnosis not present

## 2019-05-21 NOTE — Progress Notes (Signed)
[place] OFFICE VISIT PROGRESS NOTE  I connected with Brooke [name] on 05/21/19 at  9:30 AM EST by telephone and verified that I am speaking with the correct person using two identifiers.   I discussed the limitations, risks, security and privacy concerns of performing an evaluation and management service by telemedicine and the availability of in-person appointments. I also discussed with the patient that there may be a patient responsible charge related to this service. The patient expressed understanding and agreed to proceed.    Patient's location: Home Provider's location: Office   Diagnosis: Non-small cell lung cancer  INTERVAL HISTORY:   Brooke [name] is seen today for a telehealth visit.  She requested a telehealth visit secondary to the Covid pandemic.  Brooke [name] reports feeling well at present.  Good appetite.  She reports gaining 3-4 pounds.  She was treated for an upper respiratory infection by Dr. Melvyn Novas on 04/06/2019.  She was treated with a Depo-Medrol injection and a course of Levaquin.  Her symptoms improved.  She has been hoarse for the past few months.  No other complaint.  Her caretaker was diagnosed with Covid on approximately 04/29/2019.  She last had contact with the caretaker approximately 10 days prior to this.   Medications: I have reviewed the patient's current medications.  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  PDL1 60%  Left lung radiation 11/08/2016 through 11/21/2016  CT  chest 12/12/2016-reexpansion of left upper lobe with a decreased left upper lobe mass, unchanged mediastinal adenopathy, progression of a splenic metastasis/pancreatic tail/gastrohepatic ligament metastasis, right lower lobe pneumonia  Cycle 1 Pembrolizumab 12/14/2016  Cycle 2 Pembrolizumab 01/03/2017  Cycle 3 Pembrolizumab 01/24/2017  Cycle 4 Pembrolizumab 02/12/2017  CT 03/05/2018-decrease in left upper lobe mass, mediastinal adenopathy, and splenic mass  Cycle 5 pembrolizumab 03/07/2017  Cycle 6 Pembrolizumab 04/02/2017  Cycle 7 pembrolizumab 04/25/2017  Cycle 8 Pembrolizumab 05/14/2017  Cycle 9 Pembrolizumab 06/06/2017  CT 06/20/2017-mild decrease in left upper lobe mass, mild increase in paramediastinal left upper lobe nodule-radiation change?, decreased splenic metastasis  Cycle 10 pembrolizumab 06/25/2017  Cycle 11 pembrolizumab 07/18/2017  Cycle 12 pembrolizumab 08/29/2017  Cycle 13 pembrolizumab 09/17/2017  Cycle 14 Pembrolizumab 10/10/2017  CT chest 10/24/2017-slight enlargement of small mediastinal lymph nodes, increased left upper lobe consolidation, decreased size of spleen lesion  Cycle 15 pembrolizumab 10/29/2017  Cycle 16 Pembrolizumab 11/21/2017  Cycle 17 Pembrolizumab 12/11/2017  Cycle 18 pembrolizumab 01/02/2018  Cycle 19 Pembrolizumab 01/21/2018  Cycle 20 Pembrolizumab 02/13/2018  CT chest 02/27/2018-decreased left paratracheal node, progressive consolidation/fibrosis in the left upper lung, decreased size of splenic mass  Cycle 21 pembrolizumab 03/04/2018  Cycle 22 Pembrolizumab 03/27/2018  Cycle 23 pembrolizumab 04/15/2018  Cycle 24 pembrolizumab 05/08/2018  Cycle 25 Pembrolizumab 05/27/2018  CT chest 06/16/2018-stable left upper lung radiation fibrosis with no evidence of local tumor recurrence. Decreased left paratracheal adenopathy. No new or progressive metastatic disease in the chest. Gastrohepatic ligament lymphadenopathy stable. Splenic  metastasis decreased. T5 vertebral compression fracture with associated patchy sclerosis and no discrete osseous lesion, new in the interval.  Cycle 26 Pembrolizumab 06/19/2018  Cycle 27 pembrolizumab 07/08/2018  Cycle  28 pembrolizumab 07/31/2018  Cycle 29 pembrolizumab 08/19/2018  Cycle 30 Pembrolizumab 09/11/2018  Cycle 31 pembrolizumab 09/30/2018  Cycle 32 pembrolizumab 10/23/2018  CT chest 10/28/2018-left apical pleural-parenchymal opacity consistent with radiation scarring has become more confluent likely representing evolutionary change. Continued further decrease in size of index left paratracheal node and splenic metastasis. Gastrohepatic ligament lymph node not changed.  Cycle 33 Pembrolizumab 11/11/2018  Cycle 34 pembrolizumab 12/04/2018  Cycle 35 pembrolizumab 12/23/2018  CT chest 02/12/2019- posttreatment changes left upper lobe unchanged.  Continued decrease in size of splenic metastasis.  Stable appearance of left paratracheal lymph nodes.  Stable gastrohepatic adenopathy.  Incomplete visualization of retroperitoneal lymph nodes in the upper abdomen.   2. History of acute respiratory failure secondary to #1  3. Oxygen dependent COPD  4. History of a NSTEMI 2015  5. Right lung pneumonia on chest CT 12/12/2016-treated with Levaquin  6.Grade 2 rash possibly related to Pembrolizumab. Treatment held 08/06/2017.Improved 08/29/2017, treatment resumed, rash has resolved   Disposition: Brooke [name] has a history of metastatic non-small cell lung cancer.  She was treated with 2 years of pembrolizumab therapy.  She is in clinical remission.  She will be scheduled for a restaging chest CT and office visit in approximately 6 weeks.  The hoarseness may be related to COPD, a recent infection, or potentially vocal cord paralysis from cancer.  [name] call for new symptoms.   I discussed the assessment and treatment plan with the patient. The patient was provided an  opportunity to ask questions and all were answered. The patient agreed with the plan and demonstrated an understanding of the instructions.   The patient was advised to call back or seek an in-person evaluation if the symptoms worsen or if the condition fails to improve as anticipated.  I provided 15 minutes of telephone, chart review, and documentation time during this encounter, and > 50% was spent counseling as documented under my assessment & plan.  Betsy Coder ANP/GNP-BC   05/21/2019 9:42 AM

## 2019-05-22 ENCOUNTER — Telehealth: Payer: Self-pay | Admitting: Oncology

## 2019-05-22 NOTE — Telephone Encounter (Signed)
Scheduled per 1/14 los. Called and left msg. Mailing printout

## 2019-05-25 ENCOUNTER — Telehealth: Payer: Self-pay | Admitting: Internal Medicine

## 2019-05-25 DIAGNOSIS — R490 Dysphonia: Secondary | ICD-10-CM

## 2019-05-25 NOTE — Telephone Encounter (Signed)
Refer to Dr Victorio Palm group  Dx hoarseness

## 2019-05-25 NOTE — Telephone Encounter (Signed)
Called and spoke with both pt and spouse who stated that pt is having more problems with her vocal cords and losing her voice to the point that she can hardly talk now. Pt's husband mentioned that MW had said in the past if pt had worsening symptoms that an ENT referral could take place.  Dr. Melvyn Novas, please advise on this if you have a preference with an ENT.  Instructions  No change in medications but ok to leave off 02 at rest if saturations are over 90%   GERD (REFLUX)  is an extremely common cause of respiratory symptoms just like yours , many times with no obvious heartburn at all.    It can be treated with medication, but also with lifestyle changes including elevation of the head of your bed (ideally with 6 -8inch blocks under the headboard of your bed),  Smoking cessation, avoidance of late meals, excessive alcohol, and avoid fatty foods, chocolate, peppermint, colas, red wine, and acidic juices such as orange juice.  NO MINT OR MENTHOL PRODUCTS SO NO COUGH DROPS  USE SUGARLESS CANDY INSTEAD (Jolley ranchers or Stover's or Life Savers) or even ice chips will also do - the key is to swallow to prevent all throat clearing. NO OIL BASED VITAMINS - use powdered substitutes.  Avoid fish oil when coughing. >>>  If not improving we will need to send you to ENT   Please schedule a follow up visit in 6 months but call sooner if needed

## 2019-05-25 NOTE — Telephone Encounter (Signed)
I called and spoke with the patient's husband and made them aware that Dr. Melvyn Novas approved for [place] to send a referral to ENT. Order has been placed.

## 2019-05-26 ENCOUNTER — Telehealth: Payer: Self-pay | Admitting: Internal Medicine

## 2019-05-26 NOTE — Telephone Encounter (Signed)
Spoke with patient and her husband. Advised them that the referral had been placed to [facility] via [facility]. Provided them with the office address and phone number. Also advised them that that [facility] will call to make the appointment but if they haven't heard anything by late tomorrow or Thursday to call them. They verbalized understanding.   Nothing further needed at time of call.

## 2019-06-23 IMAGING — CR CHEST - 2 VIEW
2 series · 2 of 2 positions shown · non-contrast
Comparison: Chest CT 06/16/2018

CLINICAL DATA: History of lung cancer left upper lobe. COPD.
Increased dyspnea and cough

EXAM:
CHEST - 2 VIEW

[w chest pa]
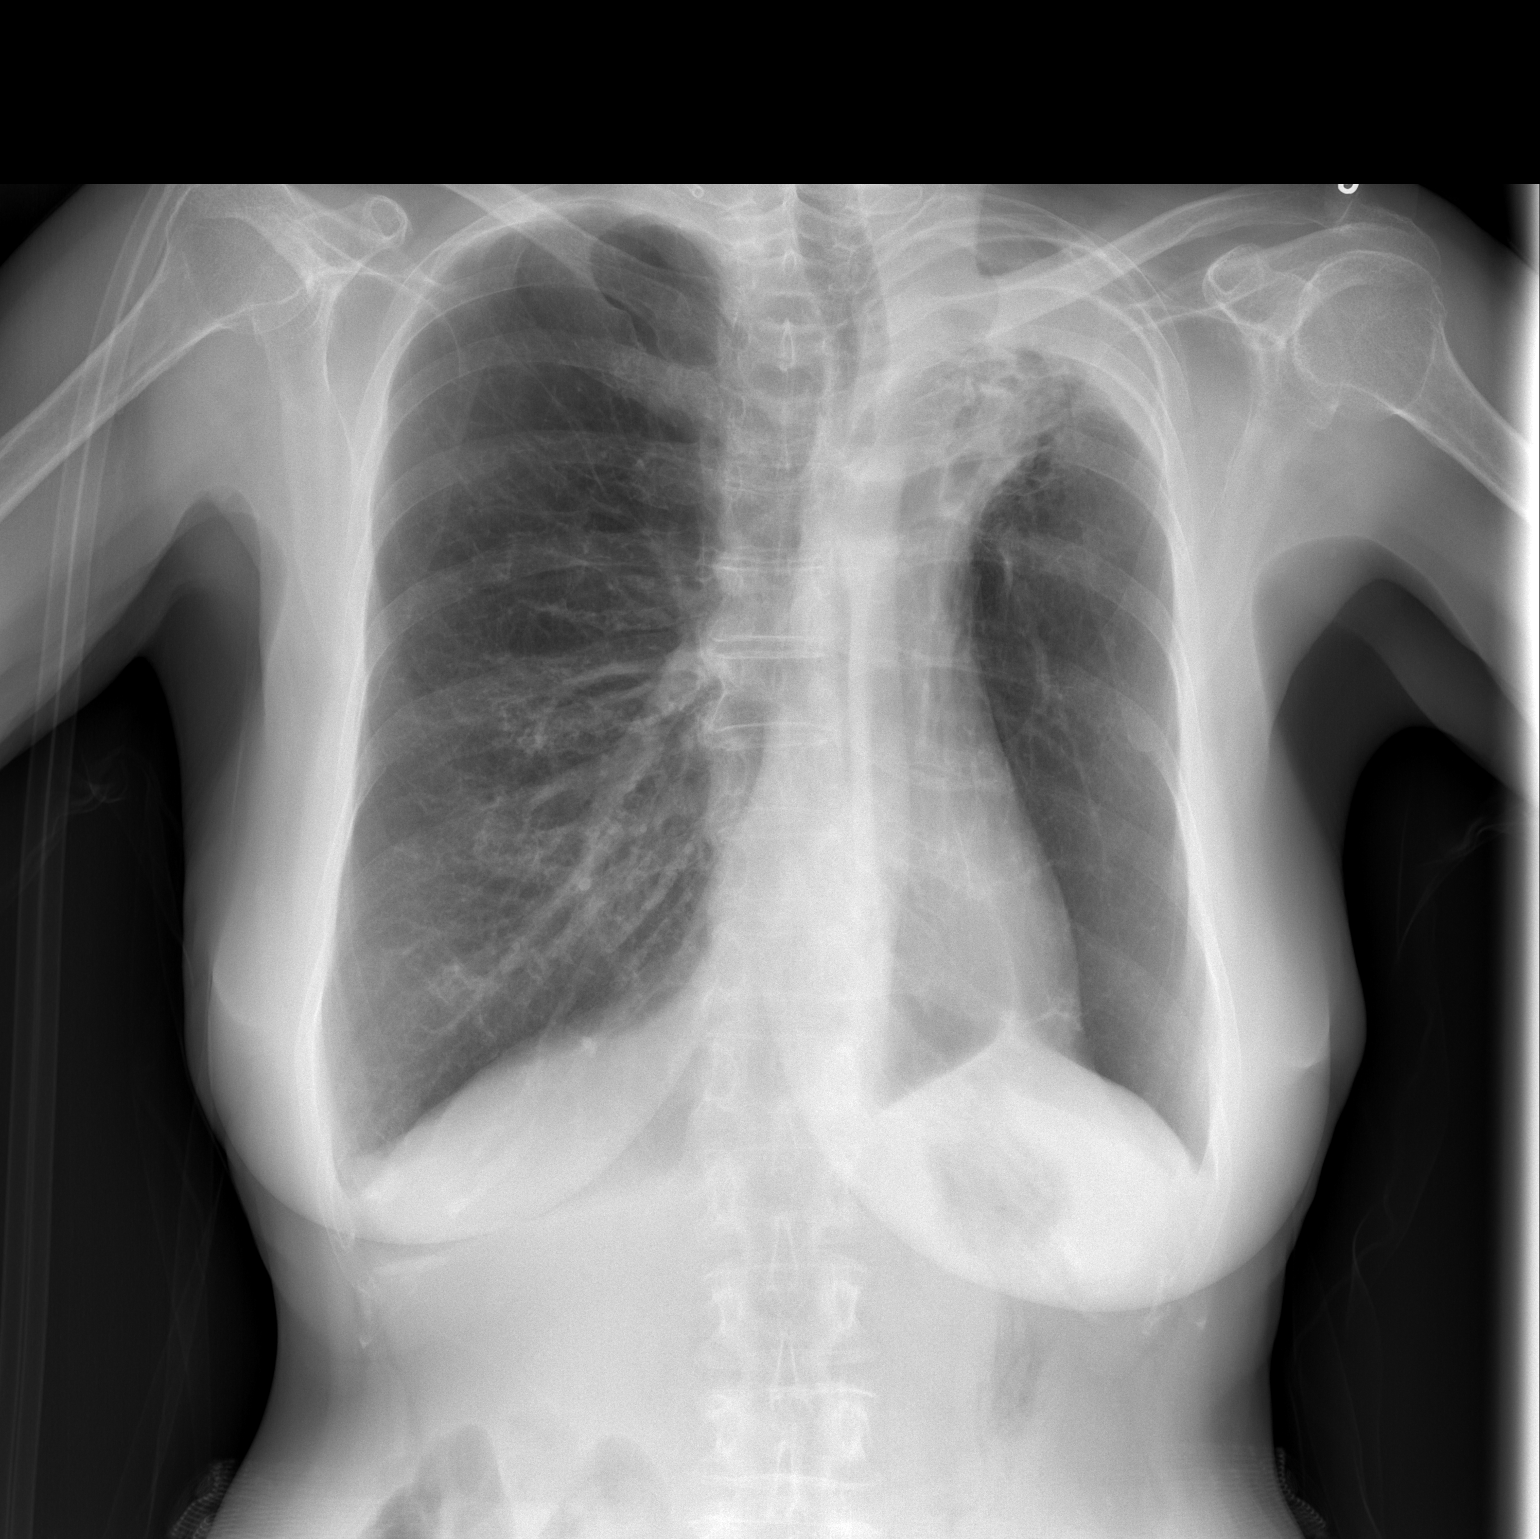

[w chest lat]
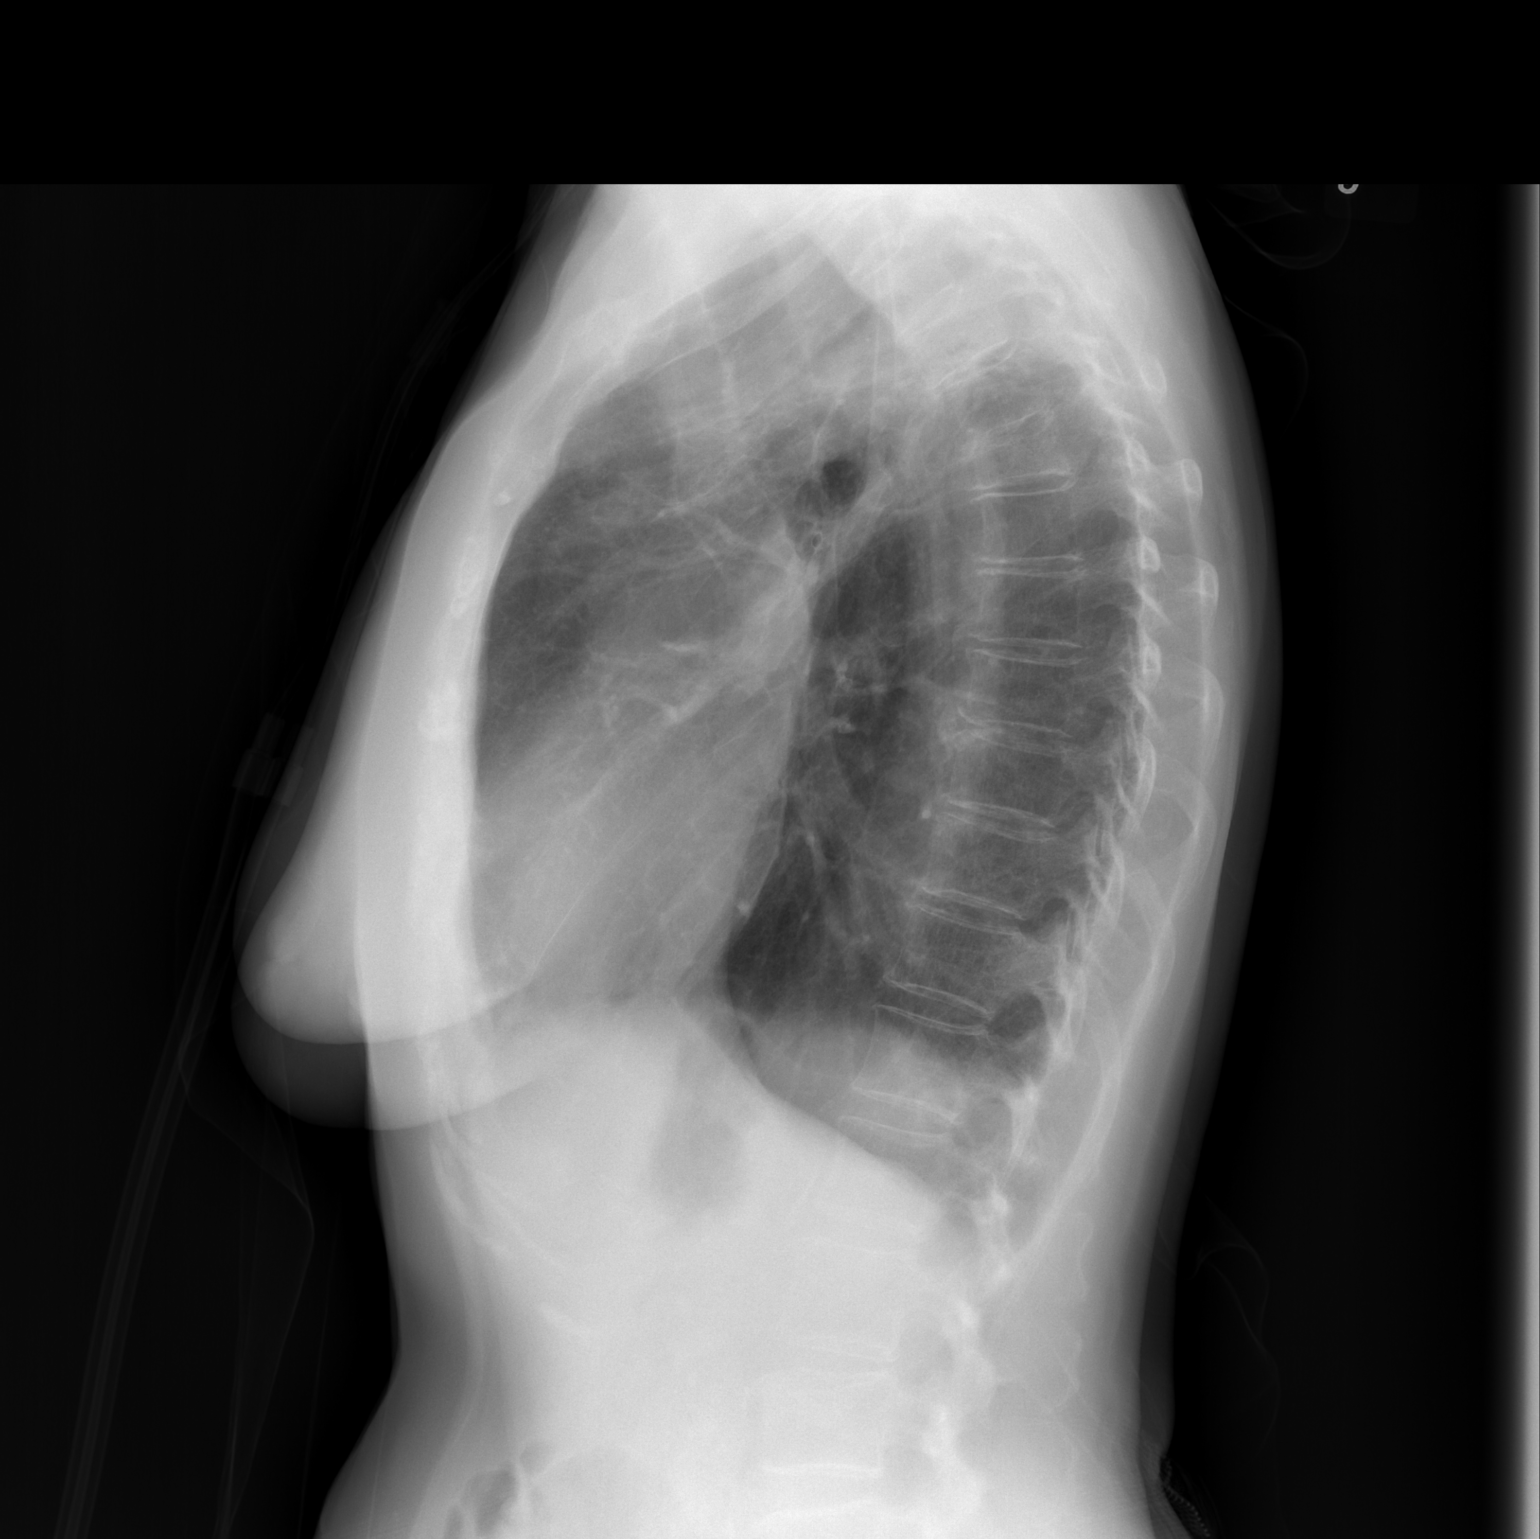

[2 of 2 positions shown; findings below may reference images not displayed]

FINDINGS: Volume loss with extensive scarring in the left apex appears stable.
No recurrent mass identified. Right lung is clear. No pleural
effusion or heart failure. Chronic compression fracture midthoracic
spine unchanged.
IMPRESSION: COPD with chronic scarring left upper lobe stable. No acute
abnormality.

## 2019-07-02 ENCOUNTER — Ambulatory Visit (HOSPITAL_COMMUNITY)
Admission: RE | Admit: 2019-07-02 | Discharge: 2019-07-02 | Disposition: A | Discharge status: Home / Self Care | Payer: Medicare Other | Source: Ambulatory Visit | Attending: Oncology | Admitting: Oncology

## 2019-07-02 ENCOUNTER — Other Ambulatory Visit: Payer: Self-pay

## 2019-07-02 ENCOUNTER — Inpatient Hospital Stay: Payer: Medicare Other | Attending: Oncology

## 2019-07-02 ENCOUNTER — Telehealth: Payer: Self-pay | Admitting: *Deleted

## 2019-07-02 DIAGNOSIS — Z79899 Other long term (current) drug therapy: Secondary | ICD-10-CM | POA: Diagnosis not present

## 2019-07-02 DIAGNOSIS — M4854XA Collapsed vertebra, not elsewhere classified, thoracic region, initial encounter for fracture: Secondary | ICD-10-CM | POA: Insufficient documentation

## 2019-07-02 DIAGNOSIS — Z923 Personal history of irradiation: Secondary | ICD-10-CM | POA: Insufficient documentation

## 2019-07-02 DIAGNOSIS — C7889 Secondary malignant neoplasm of other digestive organs: Secondary | ICD-10-CM | POA: Insufficient documentation

## 2019-07-02 DIAGNOSIS — I252 Old myocardial infarction: Secondary | ICD-10-CM | POA: Insufficient documentation

## 2019-07-02 DIAGNOSIS — Z9981 Dependence on supplemental oxygen: Secondary | ICD-10-CM | POA: Insufficient documentation

## 2019-07-02 DIAGNOSIS — I2510 Atherosclerotic heart disease of native coronary artery without angina pectoris: Secondary | ICD-10-CM | POA: Insufficient documentation

## 2019-07-02 DIAGNOSIS — J440 Chronic obstructive pulmonary disease with acute lower respiratory infection: Secondary | ICD-10-CM | POA: Insufficient documentation

## 2019-07-02 DIAGNOSIS — C3412 Malignant neoplasm of upper lobe, left bronchus or lung: Secondary | ICD-10-CM | POA: Diagnosis present

## 2019-07-02 DIAGNOSIS — I700 Atherosclerosis of aorta: Secondary | ICD-10-CM | POA: Insufficient documentation

## 2019-07-02 DIAGNOSIS — J8410 Pulmonary fibrosis, unspecified: Secondary | ICD-10-CM | POA: Insufficient documentation

## 2019-07-02 LAB — CMP (CANCER CENTER ONLY)
ALT: 12 U/L (ref 0–44)
AST: 16 U/L (ref 15–41)
Albumin: 3.7 g/dL (ref 3.5–5.0)
Alkaline Phosphatase: 76 U/L (ref 38–126)
Anion gap: 8 (ref 5–15)
BUN: 9 mg/dL (ref 8–23)
CO2: 27 mmol/L (ref 22–32)
Calcium: 9.2 mg/dL (ref 8.9–10.3)
Chloride: 105 mmol/L (ref 98–111)
Creatinine: 0.63 mg/dL (ref 0.44–1.00)
GFR, Est AFR Am: >60 mL/min (ref >60)
GFR, Estimated: >60 mL/min (ref >60)
Glucose, Bld: 92 mg/dL (ref 70–99)
Potassium: 3.8 mmol/L (ref 3.5–5.1)
Sodium: 140 mmol/L (ref 135–145)
Total Bilirubin: 0.4 mg/dL (ref 0.3–1.2)
Total Protein: 7.3 g/dL (ref 6.5–8.1)

## 2019-07-02 MED ORDER — SODIUM CHLORIDE (PF) 0.9 % IJ SOLN
INTRAMUSCULAR | Status: AC
  Filled 2019-07-02: qty 50

## 2019-07-02 MED ORDER — IOHEXOL 300 MG/ML  SOLN
75.0000 mL | Freq: Once | INTRAMUSCULAR | Status: AC | PRN
  Administered 2019-07-02: 75 mL via INTRAVENOUS

## 2019-07-03 ENCOUNTER — Telehealth: Payer: Self-pay | Admitting: Nurse Practitioner

## 2019-07-03 ENCOUNTER — Encounter: Payer: Self-pay | Admitting: Nurse Practitioner

## 2019-07-03 ENCOUNTER — Encounter: Payer: Self-pay | Admitting: *Deleted

## 2019-07-03 ENCOUNTER — Other Ambulatory Visit: Payer: Self-pay

## 2019-07-03 ENCOUNTER — Inpatient Hospital Stay (HOSPITAL_BASED_OUTPATIENT_CLINIC_OR_DEPARTMENT_OTHER): Payer: Medicare Other | Admitting: Nurse Practitioner

## 2019-07-03 VITALS — BP 150/82 | HR 86 | Temp 98.0°F | Resp 18 | Ht 62.5 in | Wt 100.8 lb

## 2019-07-03 DIAGNOSIS — C3412 Malignant neoplasm of upper lobe, left bronchus or lung: Secondary | ICD-10-CM

## 2019-07-03 NOTE — Progress Notes (Addendum)
[facility] OFFICE PROGRESS NOTE   Diagnosis: Non-small cell lung cancer  INTERVAL HISTORY:   Brooke [name] returns as scheduled.  She reports progressive hoarseness.  She underwent a flexible laryngoscopy on 05/29/2019.  She was found to have a right vocal cord mass.  She underwent biopsy of the mass on 06/11/2019.  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  Rare cells showed positive nuclear HSV-1 staining of unknown significance.  She has been referred to Dr. Isidore Moos.  She has stable dyspnea.  Recent mild cough.  No fever.  She thinks the cough is related to postnasal drip.  No significant dysphagia.  Objective:  Vital signs in last 24 hours:  Blood pressure (!) 150/82, pulse 86, temperature 98 F (36.7 C), temperature source Temporal, resp. rate 18, height 5' 2.5" (1.588 m), weight 100 lb 12.8 oz (45.7 kg), SpO2 100 %.    HEENT: Neck without mass. Lymphatics: No palpable cervical, supraclavicular or axillary lymph nodes. Resp: Distant breath sounds. Cardio: Regular rate and rhythm. GI: Abdomen soft and nontender.  No hepatomegaly.  No splenomegaly. Vascular: No leg edema.   Lab Results:  Lab Results  Component Value Date   WBC 6.7 12/23/2018   HGB 13.5 12/23/2018   HCT 41.0 12/23/2018   MCV 87.0 12/23/2018   PLT 241 12/23/2018   NEUTROABS 5.0 12/23/2018    Imaging:  CT Chest W Contrast  Result Date: 07/02/2019 CLINICAL DATA:  74 year old female with history of lung cancer diagnosed in 10/2016. Treated with radiation therapy and chemotherapy. Follow-up study. EXAM: CT CHEST WITH CONTRAST TECHNIQUE: Multidetector CT imaging of the chest was performed during intravenous contrast administration. CONTRAST:  88m OMNIPAQUE IOHEXOL 300 MG/ML  SOLN COMPARISON:  Chest CT 02/12/2019. FINDINGS: Cardiovascular: Heart size is normal. There is no significant pericardial fluid, thickening or pericardial  calcification. There is aortic atherosclerosis, as well as atherosclerosis of the great vessels of the mediastinum and the coronary arteries, including calcified atherosclerotic plaque in the left anterior descending, left circumflex and right coronary arteries. Mediastinum/Nodes: No pathologically enlarged mediastinal or hilar lymph nodes. Esophagus is unremarkable in appearance. No axillary lymphadenopathy. Lungs/Pleura: Again noted is extensive mass-like architectural distortion and volume loss in the upper left hemithorax involving the majority of the left upper lobe and the superior segment of the left lower lobe, most compatible with chronic postradiation mass-like fibrosis. Uninvolved portions of the left lung demonstrate compensatory hyperexpansion. No definite suspicious appearing pulmonary nodules or masses are noted. No acute consolidative airspace disease. No pleural effusions. Diffuse bronchial wall thickening with moderate centrilobular and paraseptal emphysema. Upper Abdomen: Slight decreased size of the enlarged gastrohepatic lymph node which currently measures 1.3 cm in short axis. Heterogeneously enhanced splenic lesion which is similar to the prior study, currently measuring 3.3 x 2.7 cm. Aortic atherosclerosis. Multiple low-attenuation lesions in the visualize liver, largest of which is incompletely imaged (axial image 136 of series 2) measuring 2.5 x 1.8 cm. Musculoskeletal: Chronic T5 compression fracture with 30% loss of anterior vertebral body height, similar to the prior study. There are no aggressive appearing lytic or blastic lesions noted in the visualized portions of the skeleton. IMPRESSION: 1. Stable post treatment related changes of chronic postradiation mass-like fibrosis in the left lung, without evidence to suggest metastatic disease in the thorax. 2. 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 3. Slight regression of enlarged likely metastatic gastrohepatic lymph  node, which likely  reflects positive response to therapy. 4. Aortic atherosclerosis, in addition to 3 vessel coronary artery disease. Assessment for potential risk factor modification, dietary therapy or pharmacologic therapy may be warranted, if clinically indicated. 5. Diffuse bronchial wall thickening with moderate centrilobular and paraseptal emphysema; imaging findings suggestive of underlying COPD. Aortic Atherosclerosis (ICD10-I70.0) and Emphysema (ICD10-J43.9). Electronically Signed   By: Daniel  Entrikin M.D.   On: 07/02/2019 14:37    Medications: I have reviewed the patient's current medications.  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  PDL1 60%  Left lung radiation 11/08/2016 through 11/21/2016  CT chest 12/12/2016-reexpansion of left upper lobe with a decreased left upper lobe mass, unchanged mediastinal adenopathy, progression of a splenic metastasis/pancreatic tail/gastrohepatic ligament metastasis, right lower lobe pneumonia  Cycle 1 Pembrolizumab 12/14/2016  Cycle 2 Pembrolizumab 01/03/2017  Cycle 3 Pembrolizumab 01/24/2017  Cycle 4 Pembrolizumab 02/12/2017  CT 03/05/2018-decrease in left upper lobe mass, mediastinal adenopathy, and splenic mass  Cycle 5 pembrolizumab 03/07/2017  Cycle 6 Pembrolizumab 04/02/2017  Cycle 7 pembrolizumab 04/25/2017  Cycle 8 Pembrolizumab 05/14/2017  Cycle 9 Pembrolizumab 06/06/2017  CT 06/20/2017-mild decrease in left upper lobe mass, mild increase in paramediastinal left upper lobe nodule-radiation change?,  decreased splenic metastasis  Cycle 10 pembrolizumab 06/25/2017  Cycle 11 pembrolizumab 07/18/2017  Cycle 12 pembrolizumab 08/29/2017  Cycle 13 pembrolizumab 09/17/2017  Cycle 14 Pembrolizumab 10/10/2017  CT chest 10/24/2017-slight enlargement of small mediastinal lymph nodes, increased left upper lobe consolidation, decreased size of spleen lesion  Cycle 15 pembrolizumab 10/29/2017  Cycle 16 Pembrolizumab 11/21/2017  Cycle 17 Pembrolizumab 12/11/2017  Cycle 18 pembrolizumab 01/02/2018  Cycle 19 Pembrolizumab 01/21/2018  Cycle 20 Pembrolizumab 02/13/2018  CT chest 02/27/2018-decreased left paratracheal node, progressive consolidation/fibrosis in the left upper lung, decreased size of splenic mass  Cycle 21 pembrolizumab 03/04/2018  Cycle 22 Pembrolizumab 03/27/2018  Cycle 23 pembrolizumab 04/15/2018  Cycle 24 pembrolizumab 05/08/2018  Cycle 25 Pembrolizumab 05/27/2018  CT chest 06/16/2018-stable left upper lung radiation fibrosis with no evidence of local tumor recurrence. Decreased left paratracheal adenopathy. No new or progressive metastatic disease in the chest. Gastrohepatic ligament lymphadenopathy stable. Splenic metastasis decreased. T5 vertebral compression fracture with associated patchy sclerosis and no discrete osseous lesion, new in the interval.  Cycle 26 Pembrolizumab 06/19/2018  Cycle 27 pembrolizumab 07/08/2018  Cycle 28 pembrolizumab 07/31/2018  Cycle 29 pembrolizumab 08/19/2018  Cycle 30 Pembrolizumab 09/11/2018  Cycle 31 pembrolizumab 09/30/2018  Cycle 32 pembrolizumab 10/23/2018  CT chest 10/28/2018-left apical pleural-parenchymal opacity consistent with radiation scarring has become more confluent likely representing evolutionary change. Continued further decrease in size of index left paratracheal node and splenic metastasis. Gastrohepatic ligament lymph node not changed.  Cycle 33 Pembrolizumab 11/11/2018  Cycle 34 pembrolizumab 12/04/2018  Cycle 35  pembrolizumab 12/23/2018  CT chest 02/12/2019-posttreatment changes left upper lobe unchanged. Continued decrease in size of splenic metastasis. Stable appearance of left paratracheal lymph nodes. Stable gastrohepatic adenopathy. Incomplete visualization of retroperitoneal lymph nodes in the upper abdomen.  CT chest 07/02/2019-stable post treatment related changes left lung.  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  Slight regression of enlarged likely metastatic gastrohepatic lymph node.   2. 05/29/2019 laryngoscopy-exophytic appearing mass mid right vocal   cord.    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  Tumor cells negative for p16, CMV and HSV 2.  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    3. History of acute respiratory failure secondary to #1  4. Oxygen dependent COPD  5. History of a NSTEMI 2015  6. Right lung pneumonia on chest CT 12/12/2016-treated with Levaquin  7.Grade 2 rash possibly related to Pembrolizumab. Treatment held 08/06/2017.Improved 08/29/2017, treatment resumed, rash has resolved   Disposition: Brooke [name] appears stable.  She remains in clinical remission from lung cancer.  Recent restaging chest CT shows no evidence of progression.  Plan to continue to follow with observation.  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  She has been referred for radiation.  She will return for follow-up here in 3 months.  We are available to see her sooner if needed.  Patient seen with [name].  CT images reviewed on the computer with Ms. Metts at today's visit.    [name] ANP/GNP-BC   07/03/2019  10:00 AM This was a shared visit with [name].  Brooke [name] remains in remission from non-small cell lung cancer.  We reviewed CT images with her today.  She has developed progressive hoarseness and was found to have a mass on the right vocal cord.  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  She is scheduled to see Dr. Isidore Moos.  The recommended treatment will likely be radiation.  She will return for an office visit in 3 months.  We reviewed the CT images with Ms. Jefcoat.  Julieanne Manson, MD

## 2019-07-03 NOTE — Telephone Encounter (Signed)
Oncology Nurse Navigator Documentation  Placed introductory call to new referral patient Brooke [name].  Introduced myself as the H&N oncology nurse navigator that works with Dr. Isidore Moos to whom she has been referred by Dr. Blenda Nicely.  She confirmed understanding of referral to Dr. Isidore Moos as well as SLP Garald Balding re dysphonia.  Briefly explained my role as her navigator, provided my contact information.   Indicated appt with Dr. Isidore Moos is pending, indicated I am arranging appt with SLP at [facility] next Thursday.  I explained the purpose of a dental evaluation prior to starting RT, indicated she would be contacted by [facility] DM to arrange an appt.    I encouraged her to call with questions/concerns as she moves forward with appts and procedures.    She verbalized understanding of information provided, expressed appreciation for my call.  Navigator Initial Assessment . Employment Status: retired . Currently on FMLA / STD: no . Living Situation: lives alone . Support System: friend . PCP: yes . PCD: yes . Financial Concerns: . Transportation Needs: would like rides when RT starts . Sensory Deficits: no . Language Barriers/Interpreter Needed:  no . Ambulation Needs: no . DME Used in Home: no . Psychosocial Needs:  no . Concerns/Needs Understanding Cancer:  addressed/answered by navigator to best of ability . Self-Expressed Needs: no  Gayleen Orem, RN, BSN Head & Neck Oncology Nurse [place] at [place] [phone]

## 2019-07-03 NOTE — Telephone Encounter (Signed)
Scheduled appt per 2/26 los - gave pt AVS

## 2019-07-03 NOTE — Progress Notes (Signed)
Oncology Nurse Navigator Documentation  Met with Brooke [name] following appt with [name], NP.    Marland Kitchen Further introduced myself as her Navigator, explained my role as a member of the Care Team.   . Provided New Patient Information packet, discussed contents: o Contact information for physician(s), myself, other members of the Care Team. o Advance Directive information ([place] blue pamphlet with LCSW contact info).  She stated her ADs are complete and on file. o Fall Prevention Patient [place] with highlight of [place] o SLP information sheet o Symptom Management Clinic information . Provided introductory explanation of radiation treatment including SIM planning and purpose of Aquaplast head and shoulder mask, showed her example.   Marland Kitchen Explained appointment with Dr. Isidore Moos pending, she is scheduled to meet with SLP Garald Balding next Thursday 11:00 during H&N [facility]. . I encouraged her to contact me with questions/concerns moving forward.    Gayleen Orem, RN, BSN Head & Neck Oncology Nurse [place] at [place] [phone]

## 2019-07-08 ENCOUNTER — Telehealth: Payer: Self-pay | Admitting: *Deleted

## 2019-07-08 NOTE — Telephone Encounter (Signed)
Oncology Nurse Navigator Documentation  Called [name], confirmed 11:00 arrival to Radiation Waiting s/p registration for H&N [place] appt with SLP Garald Balding.  Gayleen Orem, RN, BSN Head & Neck Oncology Nurse [place] at [place] [phone]

## 2019-07-09 ENCOUNTER — Other Ambulatory Visit: Payer: Self-pay

## 2019-07-09 ENCOUNTER — Ambulatory Visit: Payer: Medicare Other | Attending: Otolaryngology

## 2019-07-09 ENCOUNTER — Encounter: Payer: Self-pay | Admitting: *Deleted

## 2019-07-09 DIAGNOSIS — R490 Dysphonia: Secondary | ICD-10-CM | POA: Diagnosis not present

## 2019-07-09 NOTE — Patient Instructions (Signed)
Today, we talked about vocal hygiene. Below are the points we covered. You will need to keep these in mind should you want to have the best chance for the best-sounding voice possible after your radiation. We can start voice therapy after your radiation if you desire.      VOICE CONSERVATION PROGRAM  1. Avoid overuse of voice or excessive use of the voice . The vocal cords can become easily fatigued. Try to sort out what is "necessary" versus "unnecessary" talking in your environment. You do not need to STOP talking, but try to limit it as much as possible.  . Think of resting your voice just as long as you talk. For example, if you talk for 5 minutes, rest your voice completely for 5 minutes. . If your voice feels "tired" during the day, try to rest it as much as possible.  2. Avoid using an excessively loud voice or shouting/raising your voice . When you yell or raise your voice, the vocal cords slam into each other, much like a strong hand clap. This causes irritation, and if this irritation continues, hoarseness may increase. . If people in your home talk loudly, ask them to reduce the volume of their voices to help you decrease your volume as well. . Sit near or face the person to whom you are speaking.   3. Avoid talking over background noise . When talking in background noise, speech automatically has increased loudness, and as in number 2 above, continued loud speech can result in increased hoarseness due to irritation to the vocal cords.  . Do not talk over the radio or the TV. Mute them before speaking, go to a quieter place to talk, or sit next to the person with whom you are watching.  4. Talk in a voice that is soft, smooth, and gentle . This allows the vocal cords to come together in a gentle way and allows the air being exhaled from the lungs to do most/all of the work when you are  speaking.    ====================================================================================== Below are the exercises I told you about, to do during your radiation. I was happy we were able to go over them today. Do it twice a day  SWALLOWING EXERCISES Do these until 6 months after your last day of radiation, then 2-3 times per week afterwards  1. Effortful Swallows - Press your tongue against the roof of your mouth for 3 seconds, then squeeze the muscles in your neck while you swallow your saliva or a sip of water - Repeat 10-15 times, 2-3 times a day, and use whenever you eat or drink 2. Pitch Raise - Repeat "he", once per second in as high of a pitch as you can - Repeat 20 times, 2-3 times a day        3.  "Siren" exercise  - Say "eeee" at a low pitch and glide up to as high of a pitch as you can, then return to as low of a pitch as you can.  - repeat 10 times, two times a day

## 2019-07-09 NOTE — Therapy (Signed)
[facility] [address] [place], [place], [address] Phone: [phone]   Fax:  [phone]  Speech Language Pathology Evaluation  Patient Details  Name: Brooke [name] MRN: [id number] Date of Birth: Jun 17, 1945 Referring Provider (SLP): Helayne Seminole., MD   Encounter Date: 07/09/2019  End of Session - 07/09/19 1513    Visit Number  1    Number of Visits  4    Date for SLP Re-Evaluation  10/07/19    SLP Start Time  1107    SLP Stop Time   1150    SLP Time Calculation (min)  43 min    Activity Tolerance  Patient tolerated treatment well       Past Medical History:  Diagnosis Date  . Chronic respiratory failure (HCC)    a. on home O2.  Marland Kitchen COPD (chronic obstructive pulmonary disease) ([place])    a. Home O2.  . Former tobacco use   . GERD (gastroesophageal reflux disease)   . Hyperlipidemia   . Liver metastases ([place])   . lung ca 10/2016  . 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 NSTEMI (non-ST elevated myocardial infarction) ([place])    a. 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 Coronary vasospasm    Past Surgical History:  Procedure Laterality Date  . LEFT HEART CATHETERIZATION WITH CORONARY ANGIOGRAM N/A 06/08/2013   Procedure: LEFT HEART CATHETERIZATION WITH CORONARY ANGIOGRAM;  Surgeon: Sanda Klein, MD;  Location: [facility];  Service: Cardiovascular;  Laterality: N/A;    There were no vitals filed for this visit.  Subjective Assessment - 07/09/19 1114    Subjective  Pt max hoarse today. "I saw a speech therapist before when I was in [place] after radiaiton for my lung cancer. She gave me some exercises, and watched me eat for two weeks."         SLP Evaluation [facility] - 07/09/19 1114      SLP Visit Information   SLP Received On  07/09/19    Referring Provider (SLP)  Helayne Seminole., MD    Onset Date  2 years ago    Medical  Diagnosis  mass on rt vocal fold      Subjective   Subjective  Pt reports history of dysphgia when at [facility], SLP visits were necessary for meal observation and for HEP, per pt.    Patient/Family Stated Goal  improve vocal quality, maintain normal swallowing function      Pain Assessment   Currently in Pain?  No/denies    Pain Score  0-No pain      General Information   HPI  pt with two year hx of hoarseness, recently worsened after shouting in November 2020. Referred to Dr. Blenda Nicely 05-29-19.who performed laryngoscopy which revealed bil vocal fold movement, however observed an exophytic mass on the free edge of mid rt vocal fold. No glottal insufficiency nor pooling of secretions noted. Referred to Dr. Eppie Gibson - consult is on 07-14-19. Pt with hx of stage IV lung NSC. Radiation June to July 2018, then with ongoing Beryle Flock which stopped August 2020.        Prior Functional Status   Cognitive/Linguistic Baseline  Within functional limits      Cognition   Overall Cognitive Status  Within Functional Limits for tasks assessed      Auditory Comprehension   Overall Auditory Comprehension  Appears within functional limits for tasks assessed  Verbal Expression   Overall Verbal Expression  Appears within functional limits for tasks assessed      Oral Motor/Sensory Function   Overall Oral Motor/Sensory Function  Appears within functional limits for tasks assessed      Motor Speech   Phonation  Hoarse;Low vocal intensity   max hoarse; pt desires a speech amplifiction system today   Phonation  Impaired    Vocal Abuses  Prolonged Vocal Use;Habitual Hyperphonia   "(My voice) got worse after I shouted on the phone"   Tension Present  --   not noted   Volume  Soft   SLP strongly encouraged pt to use quiet voice, talk less         VOICE CONSERVATION PROGRAM  1. Avoid overuse of voice or excessive use of the voice . The vocal cords can become easily fatigued. Try to sort out  what is "necessary" versus "unnecessary" talking in your environment. You do not need to STOP talking, but try to limit it as much as possible.  . Think of resting your voice just as long as you talk. For example, if you talk for 5 minutes, rest your voice completely for 5 minutes. . If your voice feels "tired" during the day, try to rest it as much as possible.  2. Avoid using an excessively loud voice or shouting/raising your voice . When you yell or raise your voice, the vocal cords slam into each other, much like a strong hand clap. This causes irritation, and if this irritation continues, hoarseness may increase. . If people in your home talk loudly, ask them to reduce the volume of their voices to help you decrease your volume as well. . Sit near or face the person to whom you are speaking.   3. Avoid talking over background noise . When talking in background noise, speech automatically has increased loudness, and as in number 2 above, continued loud speech can result in increased hoarseness due to irritation to the vocal cords.  . Do not talk over the radio or the TV. Mute them before speaking, go to a quieter place to talk, or sit next to the person with whom you are watching.  4. Talk in a voice that is soft, smooth, and gentle  This allows the vocal cords to come together in a gentle way and allows the air being exhaled from the  lungs to do most/all of the work when you are speaking.         SLP Education - 07/09/19 1512    Education Details  vocal amplification devices and why this is not recommended currently, voice hygiene measures and pt may require some voice therapy after her radiation if she desires, HEP for voice quality during radiation therapy    Person(s) Educated  Patient    Methods  Explanation;Demonstration;Verbal cues;Handout    Comprehension  Verbalized understanding;Returned demonstration;Verbal cues required;Need further instruction       SLP Short Term  Goals - 07/09/19 1523      SLP SHORT TERM GOAL #1   Title  Pt will use "confidential tone" in 5 minutes simple conversation in 2 ST sessions    Time  2    Period  --   sessions, for all STGs   Status  New      SLP SHORT TERM GOAL #2   Title  pt will complete voice HEP with rare min A    Time  1    Status  New  SLP Long Term Goals - 07/09/19 1525      SLP LONG TERM GOAL #1   Title  pt perform HEP with modified independence    Time  3    Period  --   sessions, for all LTGs   Status  New      SLP LONG TERM GOAL #2   Title  pt will use "confidential voice" in 10 minutes simple conversation in 3 sessinos with SLP    Time  3    Status  New       Plan - 07/09/19 1514    Clinical Impression Statement  Pt presents today with max hoarse voice due to exophytic mass on rt vocal fold. Pt desired speech amplification device but SLP educated pt why one of these is not recommended currently. However, pt was free to explore this option if she desired - SLP provided pt two names of companies who sell these devices. SLP and pt discussed voice hygiene measures (see "pt information"). SLP developed an exerise program today and reviewed with pt,  which will keep pt's vocal folds with WNL flexibility post-XRT. SLP believes pt would benefit from skilled ST once approx every four weeks x3-4 sessions to ensure pt is performing HEP correctly. Pt may require more frequency skilled ST after her radiation is completed for work coordinating abdominal breathing and speech, as well as a focus on vocal hygiene.    Speech Therapy Frequency  --   approx once every 4 weeks   Duration  --   3 visits   Treatment/Interventions  Other (comment);Multimodal communcation approach;SLP instruction and feedback;Patient/family education;Internal/external aids   voice exercises   Potential to Achieve Goals  Good    SLP Home Exercise Plan  provided today    Consulted and Agree with Plan of Care  Patient        Patient will benefit from skilled therapeutic intervention in order to improve the following deficits and impairments:   Hoarseness    Problem List Patient Active Problem List   Diagnosis Date Noted  . Hoarseness 04/07/2019  . Ingrown right big toenail 03/16/2018  . Goals of care, counseling/discussion 12/05/2016  . 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 Protein-calorie malnutrition, severe 11/02/2016  . SOB (shortness of breath)   . Community acquired pneumonia of left upper lobe of lung 10/22/2016  . COPD exacerbation ([place]) 10/22/2016  . Hypokalemia 10/22/2016  . Microcytic anemia 10/22/2016  . Mass of upper lobe of left lung 02/15/2016  . Chronic respiratory failure with hypoxia ([place]) 02/15/2016  . GERD (gastroesophageal reflux disease)   . Former tobacco use   . HLD (hyperlipidemia) 06/08/2013  . NSTEMI (non-ST elevated myocardial infarction) ([place]) 06/05/2013  . COPD  GOLD IV  06/05/2013    [facility] ,[place], CCC-SLP  07/09/2019, 3:27 PM  [facility] [address] [place], [place], [address] Phone: [phone]   Fax:  [phone]  Name: Brooke [name] MRN: [id number] Date of Birth: [birth year]

## 2019-07-09 NOTE — Progress Notes (Signed)
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 Rare cells show positive nuclear HSV1 staining of unknown significance.   Patient presented to Dr. Blenda Nicely on 05/29/19 with symptoms of: Dysphonia and cough that began two years ago, but became worse starting in November.  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   Nutrition Status Yes No Comments  Weight changes? [x]  []  Her weight has fluctuated since lung cancer diagnosis.   Swallowing concerns? [x]  []  In the past with previous radiation.   PEG? []  [x]     Referrals Yes No Comments  Social Work? []  [x]    Dentistry? []  [x]  She did just see Dr. Geanie Cooley her regular dentist and had full x-rays completed.   Swallowing therapy? [x]  []    Nutrition? []  [x]    Med/Onc? [x]  []  She is a patient of [name] who is following her for metastatic non-small cell lung cancer.    Safety Issues Yes No Comments  Prior radiation? [x]  []  Radiation treatment dates:   11/08/2016 to 11/21/2016. Dr. Lisbeth Renshaw  Site/dose:   The Left lung was treated to 30 Gy in 10 fractions at 3 Gy per fraction.   Pacemaker/ICD? []  [x]    Possible current pregnancy? []  [x]    Is the patient on methotrexate? []  [x]     Tobacco/Marijuana/Snuff/ETOH use: She quit smoking in 2009.   Past/Anticipated interventions by otolaryngology, if any:  She saw Dr. Blenda Nicely on 05/29/19 for initial consult. She was referred to Dr. Mila Homer, [facility],  for awake biopsy due to her cardiac and pulmonary history.   Vilinda Blanks, DO - 06/11/2019 11:40 AM EST In-Office Flexible Laryngoscopy with Biopsy Procedure Note Pre-op Dx: Right vocal cord mass Post-op Dx: Same  Procedure: Flexible laryngoscopy with biopsy  of upper aerodigestive tract lesion   Past/Anticipated interventions by medical oncology, if any:  05/21/19 [name]: Disposition: Ms. Wigington has a history of metastatic non-small cell lung cancer.  She was treated with 2 years of pembrolizumab therapy.  She is in clinical remission.  She will be scheduled for a restaging chest CT and office visit in approximately 6 weeks.  The hoarseness may be related to COPD, a recent infection, or potentially vocal cord paralysis from cancer.  Ms. Lien call for new symptoms. I discussed the assessment and treatment plan with the patient. The patient was provided an opportunity to ask questions and all were answered. The patient agreed with the plan and demonstrated an understanding of the instructions.  The patient was advised to call back or seek an in-person evaluation if the symptoms worsen or if the condition fails to improve as anticipated.   Current Complaints / other details:

## 2019-07-09 NOTE — Progress Notes (Signed)
Oncology Nurse Navigator Documentation  Met with Ms. Lesch upon her arrival for [facility].    Provided verbal overview of [place], she was seen by SLP Garald Balding.  Spoke with her at end of [facility], addressed questions. I encouraged her to call me with needs/concerns prior to next Wednesday's Teleconsult with Dr. Isidore Moos.  Gayleen Orem, RN, BSN Head & Neck Oncology [place] [facility] [place] [phone]

## 2019-07-10 ENCOUNTER — Encounter: Payer: Self-pay | Admitting: General Practice

## 2019-07-10 NOTE — Progress Notes (Signed)
[facility] Initial Psychosocial Assessment Clinical Social Work  Clinical Social Work contacted by phone to assess psychosocial, emotional, mental health, and spiritual needs of the patient.   Barriers to care/review of distress screen:  - Transportation:  Do you anticipate any problems getting to appointments?  Do you have someone who can help run errands for you if you need it?  Will need help w transportation, "they offer free pick up and delivery."   Wants to have [facility] Transportation help.  Has had radiation once before, was at [facility] at that time so SNF transported.  Was in wheelchair at that time.  Was discharged from SNF to home w home health, discharge was over two years ago.  Since then she has regained the ability to drive, but is afraid that fatigue will be a major barrier to driving herself to/from appointments.  Has not used any form of public transport - she does have Psychologist, educational available to her (similar service to [employer]) but she does not want to use any kind of congregate transport due to fears of COVID and similar when being around others.   - Help at home:  What is your living situation (alone, family, other)?  If you are physically unable to care for yourself, who would you call on to help you?  Lives by herself, is "supposed to be getting a person from [facility]" - has been on list for in home aide.  Has been on wait list for approx 2 years.  She hopes to be assigned a worker from [facility] in the near future.  Is able to cook for herself, plans easy to prepare meals that she can eat over a number of meals.  Likes to be able to fix her own food, has not considered Meals on Wheels.  Has used nutritional supplements.   - Support system:  What does your support system look like?  Who would you call on if you needed some kind of practical help?  What if you needed someone to talk to for emotional support?  Has no living family members,  only has a cousin.  Ex husband lives nearby, has helped her get to COVID shot and similar but cannot help w any physical care needs.   - Finances:  Are you concerned about finances.  Considering returning to work?  If not, applying for disability?  Feels she has good insurance, has stable income and is not behind in bills.    Has home oxygen from [employer] - delivered to home.  PCP is Dr Kathryne Eriksson, also has pulmonologist and ENT.  No DME at home.  Pays a CNA she knows to help as needed - when CNA is available.  Is visited monthly in home by NP from [place] - a chronic disease management program through her insurance company.    What is your understanding of where you are with your cancer? Its cause?  Your treatment plan and what happens next?  New diagnosis of vocal cord cancer, aware of treatment plan -  "sounds like 6 - 7 weeks of radiation."    Fatigue was major side effect of last radiation course of treatment.    What are your worries for the future as you begin treatment for cancer?  Worried about the "body cage" that they "screw you to the table with, that just terrifies me."  Claustrophic.  Does not like being immobile.  Aware that treatments last 15 minutes "I can  stand anything for 15 minutes."  Wants to be talked to during treatments, this helps relieve anxiety.  Worried about how she will be able to keep her O2 on while she is having treatments, aware she will need face mask during treatments.  Also needs to be aware of amount of O2 she needs during transport.     What are your hopes and priorities during your treatment? What is important to you? What are your goals for your care?  Would like to be able to talk again.  Has had impaired ability to talk ever since her first radiation treatments which left her voice hoarse.  Used to be able to amplify her voice, but approx 4 months ago possibly injured her voice and has had difficulty amplifying her voice.  Had wanted to get voice amplifier but  speech therapist recommends waiting til she sees results of radiation and speech tx.     CSW Summary:  Patient and family psychosocial functioning including strengths, limitations, and coping skills:  74 year old female, diagnosed w vocal cord cancer, is also lung cancer survivor.  On oxygen at home, lives alone.  Has some help from ex husband but basically is dependent on her own resources for all her care.  When first sent home from SNF two years ago, she was unable to climb stairs - gradually through working w [facility] PT, she has progressed be being able to climb stairs and drive her straight drive car.  She does struggle w fatigue - this sometimes means that she does not "feel like" fixing herself meals and can skip meals.  She does use nutritional supplements.  She has been diligent in doing any exercises prescribed to her re aiding her ability to swallow/speak.  Her biggest concerns are immobility during radiation treatments and the face mask, transportation to /from appointments, fatigue and its limitations on her life.  She is appreciative of the possibility of getting rides to/from [facility].  She also hopes that she will soon be assigned an in home aide.   Identifications of barriers to care:  Will need help w transportation  Availability of community resources:  Has [facility], will be getting aide from [facility] in near future she hopes.    Clinical Social Worker follow up needed: Not at this time - please reconsult if needed.    Edwyna Shell, LCSW Clinical Social Worker Phone:  [phone] Cell:  [phone]

## 2019-07-13 NOTE — Progress Notes (Signed)
Radiation Oncology         [phone] ________________________________  Initial outpatient Consultation by telephone as patient was unable to access MyChart video during pandemic precautions  Name: Brooke [name] MRN: [id number]  Date: 07/14/2019  DOB: 1946-04-22  CC:Brooke Sacramento, MD  Helayne Seminole, MD   REFERRING PHYSICIAN: Helayne Seminole, MD  DIAGNOSIS:    ICD-10-CM   1. 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 female who presented with worsening hoarseness, as well as cough.  Subsequently, the patient saw Dr. Blenda Nicely who performed laryngoscopy on 05/29/2019. This showed an exophytic-appearing mass on the free edge of the mid right vocal cord, not appearing to extend to false vocal cord or involve anterior commissure.  No glottal insufficiency.  Biopsy of right vocal fold on 06/11/2019 by Dr Rowe Clack revealed: at least squamous cell carcinoma in situ, p16 negative.  Swallowing issues, if any: mild issues, eats solids usually well, has occasional choking episodes  Weight Changes: fluctuating since lung ca diagnosis -- weight was 122lb previously Wt Readings from Last 3 Encounters:  07/03/19 100 lb 12.8 oz (45.7 kg)  03/31/19 100 lb 8 oz (45.6 kg)  03/05/19 99 lb 6.4 oz (45.1 kg)   Pain status: none; no throat soreness. Does have acid reflux.  Other symptoms: On home O2 per [place]  No masses felt in neck by patient or ENT.  Tobacco history, if any: former smoker, quit in or before 2009  ETOH abuse, if any: none, rare  use  Prior cancers, if any: metastatic non-small cell lung cancer, under care of [name].  She is retired. 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   Her metastatic lung cancer is in remission.  She has taken a break from immunotherapy and continues to be followed by [name] with observation.  PREVIOUS RADIATION THERAPY: Yes  11/08/16 - 11/21/16: LUL / 30 Gy in 10 fractions (Dr. 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    PAST SURGICAL HISTORY: Past Surgical History:  Procedure Laterality Date  . LEFT HEART CATHETERIZATION WITH CORONARY ANGIOGRAM N/A 06/08/2013   Procedure: LEFT HEART CATHETERIZATION WITH CORONARY ANGIOGRAM;  Surgeon: Sanda Klein, MD;  Location: [facility] CATH [facility];  Service: Cardiovascular;  Laterality: N/A;    FAMILY HISTORY: family history includes Lung cancer in her father.  SOCIAL HISTORY:  reports that she quit smoking about 12 years ago. Her smoking use included cigarettes. She has a 30.00 pack-year smoking history. She has never used smokeless tobacco. She reports current alcohol use. She reports that she does not use drugs.  ALLERGIES: Codeine, Imdur [isosorbide dinitrate], Prednisone, and Pulmicort [budesonide]  MEDICATIONS:  Current Outpatient Medications  Medication Sig Dispense Refill  . acetaminophen (TYLENOL) 325 MG tablet Per bottle as needed    . albuterol (PROVENTIL HFA;VENTOLIN HFA) 108 (90 BASE) MCG/ACT inhaler Inhale 2 puffs into the lungs every 4 (four)  hours as needed for wheezing or shortness of breath. **PLAN B**    . ALPRAZolam (XANAX) 0.25 MG tablet 1/2-1 twice daily as needed  1  . atorvastatin (LIPITOR) 20 MG tablet TAKE 1 TABLET(20 MG) BY MOUTH DAILY AT 6 PM 90 tablet 3  . budesonide-formoterol (SYMBICORT) 160-4.5 MCG/ACT inhaler INHALE 2 PUFFS INTO THE LUNGS TWICE DAILY    . guaiFENesin (MUCINEX) 600 MG 12 hr tablet Take 600 mg by mouth 2 (two) times daily as needed for cough or to loosen phlegm.     Marland Kitchen ibuprofen (ADVIL,MOTRIN) 600 MG tablet Take 600 mg by mouth every 6 (six) hours as needed.    Marland Kitchen ipratropium-albuterol (DUONEB) 0.5-2.5 (3) MG/3ML SOLN Take 3 mLs by nebulization every 4 (four) hours as needed (wheezing/shortness of breath). **PLAN C**    . NON FORMULARY [facility]  Onychomycosis Nail Lacquer -  Fluconazole 2%, Terbinafine 1% DMSO/undecylenic acid 25% Apply to affected nail once daily Qty. 120 gm 3 refills    . OXYGEN 2lpm 24/7    . Simethicone (GAS RELIEF) 180 MG CAPS Use as needed    . nitroGLYCERIN (NITROSTAT) 0.4 MG SL tablet ONE TABLET UNDER TONGUE AS NEEDED FOR CHEST PAIN EVERY 5 MINUTES FOR 3 DOSES (Patient not taking: Reported on 07/03/2019) 25 tablet 3   No current facility-administered medications for this encounter.   REVIEW OF SYSTEMS:  Notable for that above.   PHYSICAL EXAM:  vitals were not taken for this visit.   General: Alert and oriented, in no acute distress HEENT: She has a hoarse voice  LABORATORY DATA:  Lab Results  Component Value Date   WBC 6.7 12/23/2018   HGB 13.5 12/23/2018   HCT 41.0 12/23/2018   MCV 87.0 12/23/2018   PLT 241 12/23/2018   CMP     Component Value Date/Time   NA 140 07/02/2019 0933   NA 139 04/02/2017 1026   K 3.8 07/02/2019 0933   K 4.1 04/02/2017 1026   CL 105 07/02/2019 0933   CO2 27 07/02/2019 0933   CO2 25 04/02/2017 1026   GLUCOSE 92 07/02/2019 0933   GLUCOSE 88 04/02/2017 1026   BUN 9 07/02/2019 0933   BUN 19.6 04/02/2017 1026   CREATININE  0.63 07/02/2019 0933   CREATININE 0.6 04/02/2017 1026   CALCIUM 9.2 07/02/2019 0933   CALCIUM 9.3 04/02/2017 1026   PROT 7.3 07/02/2019 0933   PROT 6.9 04/02/2017 1026   ALBUMIN 3.7 07/02/2019 0933   ALBUMIN 3.2 (L) 04/02/2017 1026   AST 16 07/02/2019 0933   AST 17 04/02/2017 1026   ALT 12 07/02/2019 0933   ALT 18 04/02/2017 1026   ALKPHOS 76 07/02/2019 0933   ALKPHOS 86 04/02/2017 1026   BILITOT 0.4 07/02/2019 0933   BILITOT 0.25 04/02/2017 1026   GFRNONAA >60 07/02/2019 0933   GFRAA >60 07/02/2019 0933      Lab Results  Component Value Date   TSH 0.597 01/15/2019     RADIOGRAPHY: CT Chest W Contrast  Result Date: 07/02/2019 CLINICAL DATA:  74 year old female with history of lung cancer diagnosed in 10/2016. Treated with radiation therapy and chemotherapy. Follow-up study. EXAM: CT CHEST WITH CONTRAST TECHNIQUE: Multidetector CT imaging of the chest was performed during  intravenous contrast administration. CONTRAST:  52mL OMNIPAQUE IOHEXOL 300 MG/ML  SOLN COMPARISON:  Chest CT 02/12/2019. FINDINGS: Cardiovascular: Heart size is normal. There is no significant pericardial fluid, thickening or pericardial calcification. There is aortic atherosclerosis, as well as atherosclerosis of the great vessels of the mediastinum and the coronary arteries, including calcified atherosclerotic plaque in the left anterior descending, left circumflex and right coronary arteries. Mediastinum/Nodes: No pathologically enlarged mediastinal or hilar lymph nodes. Esophagus is unremarkable in appearance. No axillary lymphadenopathy. Lungs/Pleura: Again noted is extensive mass-like architectural distortion and volume loss in the upper left hemithorax involving the majority of the left upper lobe and the superior segment of the left lower lobe, most compatible with chronic postradiation mass-like fibrosis. Uninvolved portions of the left lung demonstrate compensatory hyperexpansion. No definite suspicious appearing  pulmonary nodules or masses are noted. No acute consolidative airspace disease. No pleural effusions. Diffuse bronchial wall thickening with moderate centrilobular and paraseptal emphysema. Upper Abdomen: Slight decreased size of the enlarged gastrohepatic lymph node which currently measures 1.3 cm in short axis. Heterogeneously enhanced splenic lesion which is similar to the prior study, currently measuring 3.3 x 2.7 cm. Aortic atherosclerosis. Multiple low-attenuation lesions in the visualize liver, largest of which is incompletely imaged (axial image 136 of series 2) measuring 2.5 x 1.8 cm. Musculoskeletal: Chronic T5 compression fracture with 30% loss of anterior vertebral body height, similar to the prior study. There are no aggressive appearing lytic or blastic lesions noted in the visualized portions of the skeleton. IMPRESSION: 1. Stable post treatment related changes of chronic postradiation mass-like fibrosis in the left lung, without evidence to suggest metastatic disease in the thorax. 2. 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 3. Slight regression of enlarged likely metastatic gastrohepatic lymph node, which likely reflects positive response to therapy. 4. Aortic atherosclerosis, in addition to 3 vessel coronary artery disease. Assessment for potential risk factor modification, dietary therapy or pharmacologic therapy may be warranted, if clinically indicated. 5. Diffuse bronchial wall thickening with moderate centrilobular and paraseptal emphysema; imaging findings suggestive of underlying COPD. Aortic Atherosclerosis (ICD10-I70.0) and Emphysema (ICD10-J43.9). Electronically Signed   By: Vinnie Langton M.D.   On: 07/02/2019 14:37      IMPRESSION/PLAN:  This is a delightful patient with Stage 0 vs 1 glottic cancer.  She is interested in nonsurgical options.  She is interested in options that may give her the best possible voice preservation.  I recommend radiotherapy for this  patient.  We discussed the potential risks, benefits, and side effects of radiotherapy. We talked in detail about acute and late effects. We discussed that some of the most bothersome acute effects may be mucositis, dysgeusia, salivary changes, skin irritation, local hair loss, dehydration, weight loss and fatigue. We talked about late effects which include but are not necessarily limited to dysphagia, hypothyroidism, soft tissue and cartilaginous injury, nonhealing potential injury in the radiation fields. No guarantees of treatment were given.  The patient is enthusiastic about proceeding with treatment. I look forward to participating in the patient's care.    We discussed a 6-week versus 4-week regimen (63Gy in 28 fx vs. 54Gy in 20 fx). I  recommend the 4 week regimen given that it will likely be easier logistically for her and in terms of acute side effects, in the setting of her multiple comorbidities.  Simulation (treatment planning) will take place in the near future.  We also discussed that the treatment of head and neck cancer is a multidisciplinary process to  maximize treatment outcomes and quality of life. For this reason the following referrals have been or will be made:  Nutritionist for nutrition support during and after treatment.  Speech language pathology for swallowing and/or speech therapy.  Social work for social support.    This encounter was provided by telemedicine platform by telephone as patient was unable to access MyChart video during pandemic precautions The patient has given verbal consent for this type of encounter and has been advised to only accept a meeting of this type in a secure network environment. The time spent during this encounter on date of service, in total, was 50 minutes. The attendants for this meeting include Eppie Gibson  and Janith Lima.  During the encounter, Eppie Gibson was located at [facility] Radiation Oncology  Department.  Brooke [name] was located at home.   __________________________________________   Eppie Gibson, MD   This document serves as a record of services personally performed by Eppie Gibson, MD. It was created on her behalf by Wilburn Mylar, a trained medical scribe. The creation of this record is based on the scribe's personal observations and the provider's statements to them. This document has been checked and approved by the attending provider.

## 2019-07-14 ENCOUNTER — Other Ambulatory Visit: Payer: Self-pay

## 2019-07-14 ENCOUNTER — Encounter: Payer: Self-pay | Admitting: Radiation Oncology

## 2019-07-14 ENCOUNTER — Ambulatory Visit
Admission: RE | Admit: 2019-07-14 | Discharge: 2019-07-14 | Disposition: A | Discharge status: Home / Self Care | Payer: Medicare Other | Source: Ambulatory Visit | Attending: Radiation Oncology | Admitting: Radiation Oncology

## 2019-07-14 DIAGNOSIS — C320 Malignant neoplasm of glottis: Secondary | ICD-10-CM

## 2019-07-15 ENCOUNTER — Encounter: Payer: Self-pay | Admitting: *Deleted

## 2019-07-15 ENCOUNTER — Telehealth: Payer: Self-pay | Admitting: Oncology

## 2019-07-15 ENCOUNTER — Encounter: Payer: Self-pay | Admitting: General Practice

## 2019-07-15 NOTE — Progress Notes (Signed)
[facility] CSW Progress Notes  Consult from Dr Isidore Moos acknowledged - [facility] has completed assessment on 07/10/19 - no needs identified at that time.  Edwyna Shell, LCSW Clinical Social Worker Phone:  [phone]

## 2019-07-15 NOTE — Telephone Encounter (Signed)
Scheduled appt per 3/10 sch message - pt aware of appt date and time

## 2019-07-15 NOTE — Progress Notes (Signed)
Oncology Nurse Navigator Documentation  Joined Brooke [name] during Teleconsult with Dr. Isidore Moos to discuss RT for her laryngeal cancer. She voiced understanding of:  4-week vs 6-week treatment regimes.  No need to see Dentistry for pre-radiotherapy evaluation.  CT SIM next step, she will be called later this week with appt. She indicated she is severely claustrophobic, expressed appreciation for open-face mask.  I encouraged her to call me with questions/concerns moving forward.  Gayleen Orem, RN, BSN Head & Neck Oncology Nurse [place] at [place] [phone]

## 2019-07-21 ENCOUNTER — Inpatient Hospital Stay: Payer: Medicare Other | Attending: Oncology

## 2019-07-21 NOTE — Progress Notes (Signed)
Nutrition Assessment   Reason for Assessment:  Patient with laryngeal cancer   ASSESSMENT:  74 year old female with laryngeal cancer.  Past medical history of lung cancer currently in remission, COPD, GERD, HLD, NSTEMI.  Planning radiation treatment.  Spoke with patient via phone for nutrition assessment.  Patient reports that she has been trying to eat higher calorie foods to gain a little bit of weight.  Reports that she switched from ensure original to plus.  Drinks 1 per day.  Reports that she usually has brunch/lunch of eggs with gravy and sausage and 2 % milk.  Early am will drink 1/2 ensure and then the other 1/2 mid afternoon.  Reports that it causes gas.  Supper is usually meat and couples of sides (beef stew with vegetables last night).  Recently made lemon mousse. Loves dark chocolate.  Does not like cheese or peanut butter.  Denies trouble swallowing foods.  Drinks water, tea, coffee, milk.   Noted has met with SLP.     Medications: simethicone   Labs: reviewed   Anthropometrics:   Height: 62.5 inches Weight: 100 lb 12.8 oz on 2/26 UBW: Noted 111 lb on 11/11/18 BMI: 18  10% weight loss in the last 8 months   Estimated Energy Needs  Kcals: 1350-1575 Protein: 68-79 g Fluid: > 1.3 L  NUTRITION DIAGNOSIS: Predicted sub optimal energy intake related to cancer and planned treatment as evidenced by expected side effects   INTERVENTION:  Discussed strategies to increase calories and protein.  Will mail High Calorie, High Protein Nutrition Therapy and recipes from AND to patient.  Encouraged patient to continue 350 calorie shakes.   Patient provided with contact information   MONITORING, EVALUATION, GOAL: patient will consume adequate calories and protein to prevent weight loss during treatment   Next Visit: to be determined with treatment  Meghin Thivierge B. Zenia Resides, [place], [place] Registered Dietitian [phone] (pager)

## 2019-07-22 ENCOUNTER — Telehealth: Payer: Self-pay | Admitting: *Deleted

## 2019-07-22 NOTE — Telephone Encounter (Signed)
Oncology Nurse Navigator Documentation  Ms. Quaranta called, I answered her questions re Friday's CT SIM, encouraged 10:30 arrival for registration followed by arrival to Radiation Waiting.  Gayleen Orem, RN, BSN Head & Neck Oncology Nurse [place] at [place] [phone]

## 2019-07-24 ENCOUNTER — Other Ambulatory Visit: Payer: Self-pay

## 2019-07-24 ENCOUNTER — Ambulatory Visit
Admission: RE | Admit: 2019-07-24 | Discharge: 2019-07-24 | Disposition: A | Discharge status: Home / Self Care | Payer: Medicare Other | Source: Ambulatory Visit | Attending: Radiation Oncology | Admitting: Radiation Oncology

## 2019-07-24 ENCOUNTER — Telehealth: Payer: Self-pay | Admitting: Family Medicine

## 2019-07-24 ENCOUNTER — Encounter: Payer: Self-pay | Admitting: *Deleted

## 2019-07-24 DIAGNOSIS — C320 Malignant neoplasm of glottis: Secondary | ICD-10-CM | POA: Insufficient documentation

## 2019-07-24 DIAGNOSIS — Z510 Encounter for antineoplastic radiation therapy: Secondary | ICD-10-CM | POA: Insufficient documentation

## 2019-07-24 NOTE — Progress Notes (Signed)
Oncology Nurse Navigator Documentation  To provide support, encouragement and care continuity, met with Ms. Coachman after her [facility]     Per her reporting, she was fitted with open-face mask, tolerated procedure with minimal difficulty.      I toured her to [facility] treatment area, explained procedures for lobby registration, arrival to Radiation Waiting, arrival to tmt area and preparation for tmt. She voiced understanding.    She requested adjustment of 2/31 appt time to accomodate 2nd COVID vaccination, indicated she would like to use [employer].  I indicated I would follow-up on both requests. I encouraged her to call me prior to next [facility].  Navigator Interventions  Sent e-mail to [employer] requesting coordination of rides beginning next Wed.  Rec'd return e-mail indicating they will reach out to her.  Arranged adjustment of 3/31 RT appt.  Gayleen Orem, RN, BSN Head & Neck Oncology Nurse [place] at [place] [phone]

## 2019-07-24 NOTE — Telephone Encounter (Signed)
   Brooke [name] DOB: 03/01/1946 MRN: [id number]   RIDER WAIVER AND RELEASE OF LIABILITY  For purposes of improving physical access to our facilities, [facility] is pleased to partner with third parties to provide [facility] patients or other authorized individuals the option of convenient, on-demand ground transportation services (the [place]") through use of the technology service that enables users to request on-demand ground transportation from independent third-party providers.  By opting to use and accept these [employer], I, the undersigned, hereby agree on behalf of myself, and on behalf of any minor child using the [employer] for whom I am the parent or legal guardian, as follows:  1. Government social research officer provided to me are provided by independent third-party transportation providers who are not [employer] or employees and who are unaffiliated with [employer]. 2. [facility] is neither a transportation carrier nor a common or public carrier. 3. [facility] has no control over the quality or safety of the transportation that occurs as a result of the [employer]. 4. [facility] cannot guarantee that any third-party transportation provider will complete any arranged transportation service. 5. [facility] makes no representation, warranty, or guarantee regarding the reliability, timeliness, quality, safety, suitability, or availability of any of the Transport Services or that they will be error free. 6. I fully understand that traveling by vehicle involves risks and dangers of serious bodily injury, including permanent disability, paralysis, and death. I agree, on behalf of myself and on behalf of any minor child using the Transport Services for whom I am the parent or legal guardian, that the entire risk arising out of my use of the [employer] remains solely with me, to the maximum extent permitted under applicable law. 7. The [employer] are provided "as is" and "as available." [facility] disclaims all representations and warranties, express, implied or statutory, not expressly set out in these terms, including the implied warranties of merchantability and fitness for a particular purpose. 8. I hereby waive and release [facility], its agents, employees, officers, directors, representatives, insurers, attorneys, assigns, successors, subsidiaries, and affiliates from any and all past, present, or future claims, demands, liabilities, actions, causes of action, or suits of any kind directly or indirectly arising from acceptance and use of the [employer]. 9. I further waive and release [place] and its affiliates from all present and future liability and responsibility for any injury or death to persons or damages to property caused by or related to the use of the [employer]. 10. I have read this Waiver and Release of Liability, and I understand the terms used in it and their legal significance. This Waiver is freely and voluntarily given with the understanding that my right (as well as the right of any minor child for whom I am the parent or legal guardian using the [employer]) to legal recourse against [place] in connection with the [employer] is knowingly surrendered in return for use of these services.   I attest that I read the consent document to Brooke [name], gave Brooke [name] the opportunity to ask questions and answered the questions asked (if any). I affirm that Brooke [name] then provided consent for she's participation in this program.     Brooke [name]

## 2019-07-28 DIAGNOSIS — C320 Malignant neoplasm of glottis: Secondary | ICD-10-CM | POA: Diagnosis not present

## 2019-07-29 ENCOUNTER — Other Ambulatory Visit: Payer: Self-pay

## 2019-07-29 ENCOUNTER — Ambulatory Visit
Admission: RE | Admit: 2019-07-29 | Discharge: 2019-07-29 | Disposition: A | Discharge status: Home / Self Care | Payer: Medicare Other | Source: Ambulatory Visit | Attending: Radiation Oncology | Admitting: Radiation Oncology

## 2019-07-29 ENCOUNTER — Encounter: Payer: Self-pay | Admitting: *Deleted

## 2019-07-29 DIAGNOSIS — C320 Malignant neoplasm of glottis: Secondary | ICD-10-CM | POA: Diagnosis not present

## 2019-07-29 MED ORDER — SONAFINE EX EMUL
1.0000 "application " | Freq: Once | CUTANEOUS | Status: AC
  Administered 2019-07-29: 1 via TOPICAL

## 2019-07-29 NOTE — Progress Notes (Signed)

## 2019-07-30 ENCOUNTER — Other Ambulatory Visit: Payer: Self-pay

## 2019-07-30 ENCOUNTER — Ambulatory Visit
Admission: RE | Admit: 2019-07-30 | Discharge: 2019-07-30 | Disposition: A | Discharge status: Home / Self Care | Payer: Medicare Other | Source: Ambulatory Visit | Attending: Radiation Oncology | Admitting: Radiation Oncology

## 2019-07-30 DIAGNOSIS — C320 Malignant neoplasm of glottis: Secondary | ICD-10-CM | POA: Diagnosis not present

## 2019-07-31 ENCOUNTER — Other Ambulatory Visit: Payer: Self-pay

## 2019-07-31 ENCOUNTER — Ambulatory Visit
Admission: RE | Admit: 2019-07-31 | Discharge: 2019-07-31 | Disposition: A | Discharge status: Home / Self Care | Payer: Medicare Other | Source: Ambulatory Visit | Attending: Radiation Oncology | Admitting: Radiation Oncology

## 2019-07-31 DIAGNOSIS — C320 Malignant neoplasm of glottis: Secondary | ICD-10-CM | POA: Diagnosis not present

## 2019-07-31 NOTE — Progress Notes (Signed)
Oncology Nurse Navigator Documentation  To provide support, encouragement and care continuity, met with [name] for her initial RT.    I reviewed the 2-step treatment process, answered questions.   She completed treatment without difficulty, denied questions/concerns.  I reviewed the registration/arrival procedure for subsequent treatments.  I provided her an Epic calendar of upcoming appts, arrived her to Radiation Nursing for post-SIM ed with RN Anderson Malta.  I encouraged her to call me with questions/concerns as tmts proceed.  Gayleen Orem, RN, BSN Head & Neck Oncology Nurse [place] at [place] [phone]

## 2019-08-03 ENCOUNTER — Other Ambulatory Visit: Payer: Self-pay | Admitting: Radiation Oncology

## 2019-08-03 ENCOUNTER — Ambulatory Visit
Admission: RE | Admit: 2019-08-03 | Discharge: 2019-08-03 | Disposition: A | Discharge status: Home / Self Care | Payer: Medicare Other | Source: Ambulatory Visit | Attending: Radiation Oncology | Admitting: Radiation Oncology

## 2019-08-03 ENCOUNTER — Other Ambulatory Visit: Payer: Self-pay

## 2019-08-03 DIAGNOSIS — C320 Malignant neoplasm of glottis: Secondary | ICD-10-CM | POA: Diagnosis not present

## 2019-08-03 MED ORDER — LIDOCAINE VISCOUS HCL 2 % MT SOLN: OROMUCOSAL | 3 refills | Status: DC

## 2019-08-04 ENCOUNTER — Ambulatory Visit
Admission: RE | Admit: 2019-08-04 | Discharge: 2019-08-04 | Disposition: A | Discharge status: Home / Self Care | Payer: Medicare Other | Source: Ambulatory Visit | Attending: Radiation Oncology | Admitting: Radiation Oncology

## 2019-08-04 ENCOUNTER — Telehealth: Payer: Self-pay

## 2019-08-04 ENCOUNTER — Other Ambulatory Visit: Payer: Self-pay

## 2019-08-04 DIAGNOSIS — C320 Malignant neoplasm of glottis: Secondary | ICD-10-CM | POA: Diagnosis not present

## 2019-08-04 NOTE — Telephone Encounter (Signed)
Nutrition Follow-up:  Patient with laryngeal cancer.  Patient is receiving radiation treatment.    Spoke with patient via phone for nutrition follow-up.  Patient reports that she is tolerating treatment well.  Noted a small amount of soreness in throat today but took tylenol and is better.  Reports last night ate filet with salad and avocado, chocolates for dessert.  Yesterday for lunch ate ham, mashed potatoes and asparagus.  Patient is drinking ensure plus shakes.  Has lidocaine on hand if needed.      Medications: reviewed  Labs: no new  Anthropometrics:   Patient reports has gained 1 lb (101lb)  100 lb 12.8 oz on 2/26   NUTRITION DIAGNOSIS: Predicted suboptimal energy intake continues with treatment   INTERVENTION:  Patient to continue with high calorie, high protein foods.  Patient to continue to drink 350 calorie shake to prevent weight loss.     MONITORING, EVALUATION, GOAL: Patient will consume adequate calories and protein to prevent weight loss.     NEXT VISIT: Tuesday, April 13th phone f/u  Brooke [name], [place], [place] Registered Dietitian [phone] (pager)

## 2019-08-05 ENCOUNTER — Other Ambulatory Visit: Payer: Self-pay

## 2019-08-05 ENCOUNTER — Ambulatory Visit
Admission: RE | Admit: 2019-08-05 | Discharge: 2019-08-05 | Disposition: A | Discharge status: Home / Self Care | Payer: Medicare Other | Source: Ambulatory Visit | Attending: Radiation Oncology | Admitting: Radiation Oncology

## 2019-08-05 DIAGNOSIS — C320 Malignant neoplasm of glottis: Secondary | ICD-10-CM | POA: Diagnosis not present

## 2019-08-06 ENCOUNTER — Ambulatory Visit
Admission: RE | Admit: 2019-08-06 | Discharge: 2019-08-06 | Disposition: A | Discharge status: Home / Self Care | Payer: Medicare Other | Source: Ambulatory Visit | Attending: Radiation Oncology | Admitting: Radiation Oncology

## 2019-08-06 ENCOUNTER — Other Ambulatory Visit: Payer: Self-pay

## 2019-08-06 DIAGNOSIS — Z510 Encounter for antineoplastic radiation therapy: Secondary | ICD-10-CM | POA: Diagnosis present

## 2019-08-06 DIAGNOSIS — C320 Malignant neoplasm of glottis: Secondary | ICD-10-CM | POA: Diagnosis not present

## 2019-08-07 ENCOUNTER — Ambulatory Visit
Admission: RE | Admit: 2019-08-07 | Discharge: 2019-08-07 | Disposition: A | Discharge status: Home / Self Care | Payer: Medicare Other | Source: Ambulatory Visit | Attending: Radiation Oncology | Admitting: Radiation Oncology

## 2019-08-07 ENCOUNTER — Other Ambulatory Visit: Payer: Self-pay

## 2019-08-07 DIAGNOSIS — C320 Malignant neoplasm of glottis: Secondary | ICD-10-CM | POA: Diagnosis not present

## 2019-08-10 ENCOUNTER — Other Ambulatory Visit: Payer: Self-pay

## 2019-08-10 ENCOUNTER — Ambulatory Visit
Admission: RE | Admit: 2019-08-10 | Discharge: 2019-08-10 | Disposition: A | Discharge status: Home / Self Care | Payer: Medicare Other | Source: Ambulatory Visit | Attending: Radiation Oncology | Admitting: Radiation Oncology

## 2019-08-10 DIAGNOSIS — C320 Malignant neoplasm of glottis: Secondary | ICD-10-CM | POA: Diagnosis not present

## 2019-08-11 ENCOUNTER — Other Ambulatory Visit: Payer: Self-pay

## 2019-08-11 ENCOUNTER — Ambulatory Visit
Admission: RE | Admit: 2019-08-11 | Discharge: 2019-08-11 | Disposition: A | Discharge status: Home / Self Care | Payer: Medicare Other | Source: Ambulatory Visit | Attending: Radiation Oncology | Admitting: Radiation Oncology

## 2019-08-11 DIAGNOSIS — C320 Malignant neoplasm of glottis: Secondary | ICD-10-CM | POA: Diagnosis not present

## 2019-08-12 ENCOUNTER — Ambulatory Visit
Admission: RE | Admit: 2019-08-12 | Discharge: 2019-08-12 | Disposition: A | Discharge status: Home / Self Care | Payer: Medicare Other | Source: Ambulatory Visit | Attending: Radiation Oncology | Admitting: Radiation Oncology

## 2019-08-12 ENCOUNTER — Other Ambulatory Visit: Payer: Self-pay

## 2019-08-12 DIAGNOSIS — C320 Malignant neoplasm of glottis: Secondary | ICD-10-CM | POA: Diagnosis not present

## 2019-08-13 ENCOUNTER — Other Ambulatory Visit: Payer: Self-pay

## 2019-08-13 ENCOUNTER — Ambulatory Visit
Admission: RE | Admit: 2019-08-13 | Discharge: 2019-08-13 | Disposition: A | Discharge status: Home / Self Care | Payer: Medicare Other | Source: Ambulatory Visit | Attending: Radiation Oncology | Admitting: Radiation Oncology

## 2019-08-13 DIAGNOSIS — C320 Malignant neoplasm of glottis: Secondary | ICD-10-CM | POA: Diagnosis not present

## 2019-08-14 ENCOUNTER — Other Ambulatory Visit: Payer: Self-pay

## 2019-08-14 ENCOUNTER — Ambulatory Visit
Admission: RE | Admit: 2019-08-14 | Discharge: 2019-08-14 | Disposition: A | Discharge status: Home / Self Care | Payer: Medicare Other | Source: Ambulatory Visit | Attending: Radiation Oncology | Admitting: Radiation Oncology

## 2019-08-14 DIAGNOSIS — C320 Malignant neoplasm of glottis: Secondary | ICD-10-CM | POA: Diagnosis not present

## 2019-08-17 ENCOUNTER — Other Ambulatory Visit: Payer: Self-pay

## 2019-08-17 ENCOUNTER — Ambulatory Visit
Admission: RE | Admit: 2019-08-17 | Discharge: 2019-08-17 | Disposition: A | Discharge status: Home / Self Care | Payer: Medicare Other | Source: Ambulatory Visit | Attending: Radiation Oncology | Admitting: Radiation Oncology

## 2019-08-17 DIAGNOSIS — C320 Malignant neoplasm of glottis: Secondary | ICD-10-CM | POA: Diagnosis not present

## 2019-08-18 ENCOUNTER — Encounter: Payer: Medicare Other | Admitting: Nutrition

## 2019-08-18 ENCOUNTER — Ambulatory Visit
Admission: RE | Admit: 2019-08-18 | Discharge: 2019-08-18 | Disposition: A | Discharge status: Home / Self Care | Payer: Medicare Other | Source: Ambulatory Visit | Attending: Radiation Oncology | Admitting: Radiation Oncology

## 2019-08-18 ENCOUNTER — Other Ambulatory Visit: Payer: Self-pay

## 2019-08-18 ENCOUNTER — Telehealth: Payer: Self-pay | Admitting: Nutrition

## 2019-08-18 DIAGNOSIS — C320 Malignant neoplasm of glottis: Secondary | ICD-10-CM | POA: Diagnosis not present

## 2019-08-18 NOTE — Telephone Encounter (Signed)
Nutrition follow up completed with patient over the telephone. Receivng radiation treatment for laryngeal cancer. She reports she eats "weird things".  Trying to eat lots of different foods and drink more water. She complains of thick saliva but is using baking soda and salt water rinses. Weight is stable at ~100 pounds.  She does not want to use lidocaine. Final treatment is on April 20.   Nutrition Diagnosis:  Predicted sub-optimal energy intake continues.  Intervention:  Educated to continue high calorie and high protein foods. Continue Ensure BID to provide additional 350 calories. Continue baking soda and salt water rinses.  Monitoring, Evaluation, Goals: Consume adequate calories and protein to promote healing and weight maintenance.  Next Visit: Patient to contact RD with questions.

## 2019-08-19 ENCOUNTER — Other Ambulatory Visit: Payer: Self-pay

## 2019-08-19 ENCOUNTER — Ambulatory Visit
Admission: RE | Admit: 2019-08-19 | Discharge: 2019-08-19 | Disposition: A | Discharge status: Home / Self Care | Payer: Medicare Other | Source: Ambulatory Visit | Attending: Radiation Oncology | Admitting: Radiation Oncology

## 2019-08-19 DIAGNOSIS — C320 Malignant neoplasm of glottis: Secondary | ICD-10-CM | POA: Diagnosis not present

## 2019-08-19 NOTE — Progress Notes (Signed)
Oncology Nurse Navigator Documentation  Spoke with [name] to coordinate her attendance at 08/20/19 [facility] to meet with SLP Garald Balding.  Provided instructions to arrive for 10:15 Radiation appointment, and 11:00 appointment with [facility] appt. Registration instructions were provided.  She voiced understanding that I will meet her after her radiation to walk her over to meet with SLP.  Harlow Asa RN, BSN, OCN Head & Neck Oncology Nurse [place] at [facility] Phone # [phone]  Fax # [phone]

## 2019-08-20 ENCOUNTER — Ambulatory Visit: Payer: Medicare Other

## 2019-08-20 ENCOUNTER — Other Ambulatory Visit: Payer: Self-pay

## 2019-08-20 ENCOUNTER — Ambulatory Visit: Payer: Medicare Other | Attending: Otolaryngology

## 2019-08-20 ENCOUNTER — Ambulatory Visit
Admission: RE | Admit: 2019-08-20 | Discharge: 2019-08-20 | Disposition: A | Discharge status: Home / Self Care | Payer: Medicare Other | Source: Ambulatory Visit | Attending: Radiation Oncology | Admitting: Radiation Oncology

## 2019-08-20 DIAGNOSIS — C320 Malignant neoplasm of glottis: Secondary | ICD-10-CM | POA: Diagnosis not present

## 2019-08-20 DIAGNOSIS — R490 Dysphonia: Secondary | ICD-10-CM | POA: Diagnosis not present

## 2019-08-20 NOTE — Progress Notes (Signed)
Oncology Nurse Navigator Documentation  Met with patient during [place] this morning. She voices concerns with thick saliva, which she is managing using baking soda rinses with some relief. She voiced no other concerns at this time. I escorted her to the main lobby and exit when she was done with Glendell Docker SLP  Harlow Asa RN, BSN, [place] at [facility] Phone # [phone]  Fax # [phone]

## 2019-08-20 NOTE — Therapy (Signed)
[facility] [address] [place], [place], [address] Phone: [phone]   Fax:  [phone]  Speech Language Pathology Treatment  Patient Details  Name: Brooke [name] MRN: [id number] Date of Birth: 1945-10-31 Referring Provider (SLP): Helayne Seminole., MD   Encounter Date: 08/20/2019  End of Session - 08/20/19 1431    Visit Number  2    Number of Visits  4    Date for SLP Re-Evaluation  10/07/19    SLP Start Time  2426    SLP Stop Time   1125    SLP Time Calculation (min)  40 min    Activity Tolerance  Patient tolerated treatment well       Past Medical History:  Diagnosis Date  . Chronic respiratory failure (HCC)    a. on home O2.  Marland Kitchen COPD (chronic obstructive pulmonary disease) ([name])    a. Home O2.  . Former tobacco use   . GERD (gastroesophageal reflux disease)   . Hyperlipidemia   . Liver metastases ([name])   . lung ca 10/2016  . 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 NSTEMI (non-ST elevated myocardial infarction) ([name])    a. 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 Coronary vasospasm    Past Surgical History:  Procedure Laterality Date  . LEFT HEART CATHETERIZATION WITH CORONARY ANGIOGRAM N/A 06/08/2013   Procedure: LEFT HEART CATHETERIZATION WITH CORONARY ANGIOGRAM;  Surgeon: Sanda Klein, MD;  Location: [facility];  Service: Cardiovascular;  Laterality: N/A;    There were no vitals filed for this visit.  Subjective Assessment - 08/20/19 1057    Subjective  "I have such thick saliva."    Currently in Pain?  No/denies            ADULT SLP TREATMENT - 08/20/19 1058      General Information   Behavior/Cognition  Alert;Cooperative;Pleasant mood      Treatment Provided   Treatment provided  Dysphagia      Dysphagia Treatment   Temperature Spikes Noted  No    Respiratory Status  Nasal cannula    Oral Cavity - Dentition   Adequate natural dentition    Treatment Methods  Therapeutic exercise;Skilled observation;Patient/caregiver education    Patient observed directly with PO's  Yes    Liquids provided via  Cup    Oral Phase Signs & Symptoms  --   none noted   Pharyngeal Phase Signs & Symptoms  --   none noted   Other treatment/comments  Pt reports regular diet first 1-2 weeks of radiation but this week pt has been eating very soft foods and drinking protien drinks. Pt expectorating thickened sliva - SLP told pt to attempt t oswallow first then expectorate if unable to clear thick saliva from pharynx. Pt req'd min A for rationale for HEP. HEP completed iwth modified independence.       Assessment / Recommendations / Plan   Plan  Continue with current plan of care      Dysphagia Recommendations   Diet recommendations  Thin liquid   as tolerated   Liquids provided via  Cup    Medication Administration  --   as tolerated     Progression Toward Goals   Progression toward goals  Progressing toward goals         SLP Short Term Goals - 08/20/19 1433      SLP SHORT TERM GOAL #1  Title  Pt will use "confidential tone" in 5 minutes simple conversation in 2 ST sessions    Time  1    Period  --   sessions, for all STGs   Status  On-going      SLP SHORT TERM GOAL #2   Title  pt will complete voice HEP with rare min A    Status  Achieved       SLP Long Term Goals - 08/20/19 1434      SLP LONG TERM GOAL #1   Title  pt perform HEP with modified independence x3 sessions    Baseline  08-20-19    Time  2    Period  --   sessions, for all LTGs   Status  Revised      SLP LONG TERM GOAL #2   Title  pt will use "confidential voice" in 10 minutes simple conversation in 3 sessinos with SLP    Time  2    Status  On-going       Plan - 08/20/19 1432    Clinical Impression Statement  Pt presents today with min-mod hoarse voice due to 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 SLP  reminded pt of voice hygiene measures. SLP reviewed exerise program today with pt, with which she completed with modifed independence. SLP believes pt would cont to benefit from skilled ST once approx every four weeks x3-4 sessions to ensure pt is performing HEP correctly. Pt may require more frequency skilled ST after her radiation is completed for work coordinating abdominal breathing and speech, as well as a focus on vocal hygiene.    Speech Therapy Frequency  --   approx once every 4 weeks   Duration  --   3 visits   Treatment/Interventions  Other (comment);Multimodal communcation approach;SLP instruction and feedback;Patient/family education;Internal/external aids   voice exercises   Potential to Achieve Goals  Good    SLP Home Exercise Plan  provided today    Consulted and Agree with Plan of Care  Patient       Patient will benefit from skilled therapeutic intervention in order to improve the following deficits and impairments:   Hoarseness    Problem List Patient Active Problem List   Diagnosis Date Noted  . 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 Hoarseness 04/07/2019  . Ingrown right big toenail 03/16/2018  . Goals of care, counseling/discussion 12/05/2016  . 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 Protein-calorie malnutrition, severe 11/02/2016  . SOB (shortness of breath)   . Community acquired pneumonia of left upper lobe of lung 10/22/2016  . COPD exacerbation ([facility]) 10/22/2016  . Hypokalemia 10/22/2016  . Microcytic anemia 10/22/2016  . Mass of upper lobe of left lung 02/15/2016  . Chronic respiratory failure with hypoxia ([facility]) 02/15/2016  . GERD (gastroesophageal reflux disease)   . Former tobacco use   . HLD (hyperlipidemia) 06/08/2013  . NSTEMI (non-ST elevated myocardial infarction) ([facility]) 06/05/2013  . COPD  GOLD IV  06/05/2013    [facility] ,MS, CCC-SLP  08/20/2019, 2:35 PM  Brooke [name] [address] [place], [place], [address] Phone: [phone]   Fax:  [phone]   Name: Brooke [name] MRN: [id number] Date of Birth: July 17, 1945

## 2019-08-21 ENCOUNTER — Other Ambulatory Visit: Payer: Self-pay

## 2019-08-21 ENCOUNTER — Ambulatory Visit
Admission: RE | Admit: 2019-08-21 | Discharge: 2019-08-21 | Disposition: A | Discharge status: Home / Self Care | Payer: Medicare Other | Source: Ambulatory Visit | Attending: Radiation Oncology | Admitting: Radiation Oncology

## 2019-08-21 DIAGNOSIS — C320 Malignant neoplasm of glottis: Secondary | ICD-10-CM | POA: Diagnosis not present

## 2019-08-24 ENCOUNTER — Ambulatory Visit
Admission: RE | Admit: 2019-08-24 | Discharge: 2019-08-24 | Disposition: A | Discharge status: Home / Self Care | Payer: Medicare Other | Source: Ambulatory Visit | Attending: Radiation Oncology | Admitting: Radiation Oncology

## 2019-08-24 ENCOUNTER — Other Ambulatory Visit: Payer: Self-pay

## 2019-08-24 DIAGNOSIS — C320 Malignant neoplasm of glottis: Secondary | ICD-10-CM

## 2019-08-24 MED ORDER — SONAFINE EX EMUL
1.0000 "application " | Freq: Two times a day (BID) | CUTANEOUS | Status: DC
  Administered 2019-08-24: 1 via TOPICAL

## 2019-08-25 ENCOUNTER — Other Ambulatory Visit: Payer: Self-pay

## 2019-08-25 ENCOUNTER — Encounter: Payer: Self-pay | Admitting: Radiation Oncology

## 2019-08-25 ENCOUNTER — Ambulatory Visit
Admission: RE | Admit: 2019-08-25 | Discharge: 2019-08-25 | Disposition: A | Discharge status: Home / Self Care | Payer: Medicare Other | Source: Ambulatory Visit | Attending: Radiation Oncology | Admitting: Radiation Oncology

## 2019-08-25 DIAGNOSIS — C320 Malignant neoplasm of glottis: Secondary | ICD-10-CM | POA: Diagnosis not present

## 2019-08-25 NOTE — Progress Notes (Signed)
Oncology Nurse Navigator Documentation  Met with Ms. Alves after final RT to offer support and to celebrate end of radiation treatment.   Provided verbal/written post-RT guidance:  Importance of keeping all follow-up appts, especially those with Nutrition and SLP.  Importance of protecting treatment area from sun.  Continuation of Sonafine application 2-3 times daily, application of antibiotic ointment to areas of raw skin; when supply of Sonafine exhausted transition to OTC lotion with vitamin E. Provided/reviewed Epic calendar of upcoming appts. I will contact transportation regarding her future appointments scheduled at the [facility]. Explained my role as navigator will continue for several more months, encouraged him to call me with needs/concerns.    Jennifer Malmfelt RN, BSN, OCN Head & Neck Oncology Nurse Navigator [facility] Phone # [phone]  Fax # [phone]   

## 2019-08-26 ENCOUNTER — Telehealth: Payer: Self-pay | Admitting: Nutrition

## 2019-08-26 NOTE — Telephone Encounter (Signed)
Patient called with a nutrition related question.  She reports that her thick saliva continues and it was recommended she try diet ginger ale.  Unfortunately, when her groceries were delivered, they delivered regular ginger ale. She was questioning whether or not it would be okay for her to use this.  Explained reasoning behind using diet ginger ale.  Encouraged baking soda and salt water rinses.  Patient will continue to contact me with any other further questions.

## 2019-09-07 NOTE — Progress Notes (Signed)
Ms. Mallette presents today for 2 week follow-up after completing radiation to the larynx on 08/25/2019.  Pain issues, if any: Throat discomfort is 2/10, but her main complaint is lower back pain that has been going on for about a week. She was dealing with constipation when pain started, but it has not resolved since she moved her bowels Using a feeding tube?: N/A Weight changes, if any:  Wt Readings from Last 3 Encounters:  09/11/19 97 lb (44 kg)  07/03/19 100 lb 12.8 oz (45.7 kg)  03/31/19 100 lb 8 oz (45.6 kg)   Swallowing issues, if any: Last saw Marin Olp on 08/20/2019 and is scheduled to see him again on 09/24/2019. She feels she is able to swallow fine, but she doesn't feel like she can perform the other exercises he recommended because she is having difficulty "getting air and force behind my breath". She occasionally has a hiccup/spasm in her larynx when her throat "feels blocked" Smoking or chewing tobacco? None Using fluoride trays daily? N/A Last ENT visit was on: 06/24/2019 saw Dr. Lind Guest but does not have another appointment scheduled.  Other notable issues, if any: Skin is still reddened in treatment area but intact. She reports it peeled once, but has not had any other issues. She continues to use Sonafine to area daily. She is eating mainly soft foods and liquid supplements (soup, eggs, Boost/Ensure). Her main concern currently is her difficulty getting a deep breath. She is having to use her rescue inhaler frequently and even at rest she doesn't feel like she can take a very breath. She is scheduled for a virtual follow-up with her pulmonologist in June.   Vitals:   09/11/19 1054  BP: (!) 148/72  Pulse: 92  Resp: 18  Temp: 97.8 F (36.6 C)  SpO2: 100%

## 2019-09-11 ENCOUNTER — Ambulatory Visit
Admission: RE | Admit: 2019-09-11 | Discharge: 2019-09-11 | Disposition: A | Discharge status: Home / Self Care | Payer: Medicare Other | Source: Ambulatory Visit | Attending: Radiation Oncology | Admitting: Radiation Oncology

## 2019-09-11 ENCOUNTER — Other Ambulatory Visit: Payer: Self-pay

## 2019-09-11 ENCOUNTER — Encounter: Payer: Self-pay | Admitting: Radiation Oncology

## 2019-09-11 ENCOUNTER — Telehealth: Payer: Self-pay | Admitting: General Practice

## 2019-09-11 VITALS — BP 148/72 | HR 92 | Temp 97.8°F | Resp 18 | Ht 62.5 in | Wt 97.0 lb

## 2019-09-11 DIAGNOSIS — K5900 Constipation, unspecified: Secondary | ICD-10-CM | POA: Insufficient documentation

## 2019-09-11 DIAGNOSIS — M549 Dorsalgia, unspecified: Secondary | ICD-10-CM | POA: Insufficient documentation

## 2019-09-11 DIAGNOSIS — Z923 Personal history of irradiation: Secondary | ICD-10-CM | POA: Diagnosis not present

## 2019-09-11 DIAGNOSIS — C320 Malignant neoplasm of glottis: Secondary | ICD-10-CM | POA: Diagnosis present

## 2019-09-11 NOTE — Telephone Encounter (Signed)
[facility] CSW Progress Notes  Request received from Egbert Garibaldi RN to assist w transportation needed for appt at [facility] on 5/11 at 9 AM.  Checked w [employer] - they contacted [facility] - this office does not offer transportation assistance.  Patient requests to be directly linked w [employer] and have [employer] bill for this ride.  Patientis outside the limit for [employer] [place].  Referred patient to Transportation Services for further assistance w this need.  Edwyna Shell, LCSW Clinical Social Worker Phone:  [phone] Cell:  [phone]

## 2019-09-15 ENCOUNTER — Encounter: Payer: Self-pay | Admitting: Internal Medicine

## 2019-09-15 ENCOUNTER — Ambulatory Visit (INDEPENDENT_AMBULATORY_CARE_PROVIDER_SITE_OTHER): Payer: Medicare Other

## 2019-09-15 ENCOUNTER — Encounter: Payer: Self-pay | Admitting: Radiation Oncology

## 2019-09-15 ENCOUNTER — Other Ambulatory Visit: Payer: Self-pay

## 2019-09-15 ENCOUNTER — Ambulatory Visit: Payer: Medicare Other | Admitting: Internal Medicine

## 2019-09-15 DIAGNOSIS — R490 Dysphonia: Secondary | ICD-10-CM | POA: Diagnosis not present

## 2019-09-15 DIAGNOSIS — J449 Chronic obstructive pulmonary disease, unspecified: Secondary | ICD-10-CM | POA: Diagnosis not present

## 2019-09-15 DIAGNOSIS — J9611 Chronic respiratory failure with hypoxia: Secondary | ICD-10-CM | POA: Diagnosis not present

## 2019-09-15 MED ORDER — METHYLPREDNISOLONE ACETATE 80 MG/ML IJ SUSP
120.0000 mg | Freq: Once | INTRAMUSCULAR | Status: AC
  Administered 2019-09-15: 120 mg via INTRAMUSCULAR

## 2019-09-15 MED ORDER — LEVOFLOXACIN 500 MG PO TABS: 500.0000 mg | ORAL_TABLET | Freq: Every day | ORAL | 0 refills | Status: DC

## 2019-09-15 NOTE — Progress Notes (Signed)
Subjective:    Patient ID: Brooke [name], female   DOB: 1946/03/08,    MRN: [id number]     Brief patient profile:  67 yowf quit smoking 2009 on 02 noct 2012 maint on symbicort 160 since aorund  2014 referred to pulmonary clinic 02/15/2016 by Dr   Kathryne Eriksson for RUL Lung mass dx with Stage IV nsc 11/01/16 in setting of GOLD IV copd      History of Present Illness  02/15/2016 1st [facility] office visit/ Slayton Lubitz  GOLD IV copd/ symb 160 2bid maint  Chief Complaint  Patient presents with  . Pulmonary Consult    referred by Dr. Kathryne Eriksson for COPD.  MMRC2 = can't walk a nl pace on a flat grade s sob but does fine slow and flat eg [employer] on 2lpm  New onset bloody mucus plugs November 11 2015 assoc with wt loss  02 2lpm hs and with walking / rare saba needed rec For cough > mucinex up to 1200 mg every 12 hours as needed Rec:  Fob but refused   11/01/16   CT directed bx >>>  Pos NSC LUL    Diagnosis:   Stage IV NSCLC, Squamous cell carcinoma of the left upper lobe.     Indication for treatment:  palliative       Radiation treatment dates:   11/08/2016 to 11/21/2016  Site/dose:   The Left lung was treated to 30 Gy in 10 fractions at 3 Gy per fraction.   Beams/energy:   3D // 10X, 6X    12/06/2016  f/u ov/Oluwaseun Cremer re: post hosp f/u -  Now on dulera 200 bid and duoneb Chief Complaint  Patient presents with  . HFU    Breathing is doing well. She is using Duoneb 2 x daily and uses albuterol inhaler 1-2 x per wk on average.   presently in SNF ever since last admit 6/18-29/18 for  Active Problems:   COPD  GOLD IV    Mass of upper lobe of left lung   Chronic respiratory failure with hypoxia (HCC)   COPD exacerbation (HCC)   Hypokalemia   Microcytic anemia   SOB (shortness of breath)   Protein-calorie malnutrition, severe Doe = 2lpm x 183ft with rolling walking  Sleeping ok at < 30 degrees Doe = 2lpm x 123ft with rolling walker/2lpm Sleeping ok at < 30 degrees   rec 02 can be adjusted down and off for saturations above 90%       12/09/2017  f/u ov/Zahari Fazzino re: GOLD IV copd/  No 02 at rest/ 2lpm sleep and with activity  Chief Complaint  Patient presents with  . Follow-up    Breathing is overall doing well. She has occ cough with white sputum- relates to PND.  She uses her albuterol inhaler 2 x daily on average. She states she uses neb before she knows she is going somewhere.   Dyspnea:  MMRC2 = can't walk a nl pace on a flat grade s sob but does fine slow and flat eg still pushing cart  Cough: min pnd  SABA use: as above  rec Continue  symbicort and add stiolto 2 pffs each am - - if you don't like it for any reason resume the symbicort alone as per your med calendar  Please schedule a follow up visit in 3 months but call sooner if needed  - add:  Pt notified above typo should have said add spiriva x 2 pffs each am  and continue symbicort Take 2 puffs first thing in am and then another 2 puffs about 12 hours later.   - 12/12/2017 informed could not tol spiriva due to dry mouth   03/14/2018  f/u ov/Nataliah Hatlestad re:  GOLD IV / 02 is 2lpm  Hs and 2lpm exertion/   RA sitting / just on symbicort 160 2bid  Chief Complaint  Patient presents with  . Follow-up    increased cough with white sputum. She states her breathing has been doing well. She is using her albuterol inhaler 2 x per wk and neb maybe 2 x  per wk on average.    Dyspnea:  [employer] ok pushing cart on 2lpm  Cough: more since 2 weeks  Sleeping: flat bed/ 2-3 pillow  SABA use: avg as above 02: as above   rec Work on inhaler technique:   Ok to take augmentin unless it makes you nauseated  Please schedule a follow up visit in 3 months but call sooner if needed      06/13/2018  f/u ov/Mechille Varghese re: GOLD IV / 02 hs and prn  Chief Complaint  Patient presents with  . Shortness of Breath    Worse since the last visit.  Dyspnea:  Still doing [employer]  On 2lpm but not checking sats as rec with activity   Cough: dry worse day than noct Sleeping: falt bed / couple of pillows SABA use: helps some / uses once a day esp in am 02: 2lpm hs and prn  rec Plan A = Automatic = symbicort 160 Take 2 puffs first thing in am and then another 2 puffs about 12 hours later.  Work on inhaler technique: Plan B = Backup Only use your albuterol inhaler as a rescue medication Plan C = Crisis - only use your albuterol/ipatropium nebulizer if you first try Plan B and it fails to help > ok to use the nebulizer up to every 4 hours but if start needing it regularly call for immediate appointment Please schedule a follow up visit in 3 months but call sooner if needed  - add chart review does not show alpha one screen done    03/05/2019 acute ext ov/Emir Nack re:  GOLD IV / 02 dep  Chief Complaint  Patient presents with  . Acute Visit    Increased cough and SOB for the past 2 wks. She has already been treated with zpack. She is using her albuterol inhaler several x per day and neb a couple times per day.   Dyspnea:  Getting worse x months but esp x 2 weeks  Cough: yellow mucus x 2 weeks and no change p zpak  Sleeping: bed is flat, sev pillows SABA use: some better  02: 2lpm 24/7  rec Depomedrol 120 mg IM today  Levaquin 500 mg daily x 7 days  Please remember to go to the  x-ray department  for your tests - we will call you with the results when they are available     11/30/220 televisit, new hoarse rec  gerd rx > ENT F/u > sq cell ca vc   Last RT April 20th 2021 in [place]    09/15/2019  f/u ov/Anahis Furgeson re: worse sob and hoarseness p RT  Chief Complaint  Patient presents with  . Follow-up    Pt c/o increased SOB and coughing up frothy sputum x 3 wks- relates to RT.    Dyspnea:  Across the room x 3 weeks Cough: more frothy Sleeping:  flat bed 3 pillows  SABA use: hfa and duoneb  02: 2lpm hs and walking and not at rest  Eating ok / no choking on  Food but feels something stuck in throat making swallowing  difficult   No obvious day to day or daytime variability or assoc  mucus plugs or hemoptysis or cp or chest tightness, subjective wheeze or overt sinus or hb symptoms.   Sleeping is ok  without nocturnal  or early am exacerbation  of respiratory  c/o's or need for noct saba. Also denies any obvious fluctuation of symptoms with weather or environmental changes or other aggravating or alleviating factors except as outlined above   No unusual exposure hx or h/o childhood pna/ asthma or knowledge of premature birth.  Current Allergies, Complete Past Medical History, Past Surgical History, Family History, and Social History were reviewed in [employer] record.  ROS  The following are not active complaints unless bolded Hoarseness, sore throat, dysphagia, dental problems, itching, sneezing,  nasal congestion or discharge of excess mucus or purulent secretions, ear ache,   fever, chills, sweats, unintended wt loss or wt gain, classically pleuritic or exertional cp,  orthopnea pnd or arm/hand swelling  or leg swelling, presyncope, palpitations, abdominal pain, anorexia, nausea, vomiting, diarrhea  or change in bowel habits or change in bladder habits, change in stools or change in urine, dysuria, hematuria,  rash, arthralgias, visual complaints, headache, numbness, weakness or ataxia or problems with walking or coordination,  change in mood or  memory.        Current Meds  Medication Sig  . acetaminophen (TYLENOL) 325 MG tablet Per bottle as needed  . albuterol (PROVENTIL HFA;VENTOLIN HFA) 108 (90 BASE) MCG/ACT inhaler Inhale 2 puffs into the lungs every 4 (four) hours as needed for wheezing or shortness of breath. **PLAN B**  . ALPRAZolam (XANAX) 0.25 MG tablet 1/2-1 twice daily as needed  . atorvastatin (LIPITOR) 20 MG tablet TAKE 1 TABLET(20 MG) BY MOUTH DAILY AT 6 PM  . budesonide-formoterol (SYMBICORT) 160-4.5 MCG/ACT inhaler INHALE 2 PUFFS INTO THE LUNGS TWICE DAILY  .  guaiFENesin (MUCINEX) 600 MG 12 hr tablet Take 600 mg by mouth 2 (two) times daily as needed for cough or to loosen phlegm.   Marland Kitchen ibuprofen (ADVIL,MOTRIN) 600 MG tablet Take 600 mg by mouth every 6 (six) hours as needed.  Marland Kitchen ipratropium-albuterol (DUONEB) 0.5-2.5 (3) MG/3ML SOLN Take 3 mLs by nebulization every 4 (four) hours as needed (wheezing/shortness of breath). **PLAN C**  . lidocaine (XYLOCAINE) 2 % solution Patient: Mix 1part 2% viscous lidocaine, 1part H20. Swallow 22mL of diluted mixture, 46min before meals and at bedtime, up to QID  . nitroGLYCERIN (NITROSTAT) 0.4 MG SL tablet ONE TABLET UNDER TONGUE AS NEEDED FOR CHEST PAIN EVERY 5 MINUTES FOR 3 DOSES  . NON FORMULARY [facility]  Onychomycosis Nail Lacquer -  Fluconazole 2%, Terbinafine 1% DMSO/undecylenic acid 25% Apply to affected nail once daily Qty. 120 gm 3 refills  . OXYGEN 2lpm 24/7  . Simethicone (GAS RELIEF) 180 MG CAPS Use as needed                    Objective:   Physical Exam  W/c bound wf extremely hoarse    03/05/2019      99  06/13/2018         123  03/14/2018       127  12/09/2017         125  06/10/2017         119  01/08/2017         112   12/06/2016        110   02/15/16 119 lb (54 kg)  08/18/15 137 lb 12.8 oz (62.5 kg)  02/17/15 138 lb 1.9 oz (62.7 kg)      Vital signs reviewed  09/15/2019  - Note at rest 02 sats  98% on 1lpm pulsed HEENT : oropharynx  fine   NECK :  without JVD/Nodes/TM/ nl carotid upstrokes bilaterally   LUNGS: no acc muscle use,  Min barrel  contour chest wall with bilateral  slightly decreased bs s audible wheeze and  without cough on insp or exp maneuvers and min  Hyperresonant  to  percussion bilaterally     CV:  RRR  no s3 or murmur or increase in P2, and no edema   ABD:  soft and nontender with pos end  insp Hoover's  in the supine position. No bruits or organomegaly appreciated, bowel sounds nl  MS:   Nl gait/  ext warm without deformities, calf tenderness,  cyanosis or clubbing No obvious joint restrictions   SKIN: warm and dry without lesions    NEURO:  alert, approp, nl sensorium with  no motor or cerebellar deficits apparent.       CXR PA and Lateral:   09/15/2019 :    I personally reviewed images and agree with radiology impression as follows:                 Assessment:

## 2019-09-15 NOTE — Patient Instructions (Addendum)
Depomedrol 120 mg IM today   Levaquin 500 mg daily x 7 days   Try prilosec otc 20mg   Take 30-60 min before first meal of the day and Pepcid ac (famotidine) 20 mg one @  bedtime until cough is completely gone for at least a week without the need for cough suppression     GERD (REFLUX)  is an extremely common cause of respiratory symptoms just like yours , many times with no obvious heartburn at all.    It can be treated with medication, but also with lifestyle changes including elevation of the head of your bed (ideally with 6 -8inch blocks under the headboard of your bed),  Smoking cessation, avoidance of late meals, excessive alcohol, and avoid fatty foods, chocolate, peppermint, colas, red wine, and acidic juices such as orange juice.  NO MINT OR MENTHOL PRODUCTS SO NO COUGH DROPS  USE SUGARLESS CANDY INSTEAD (Jolley ranchers or Stover's or Life Savers) or even ice chips will also do - the key is to swallow to prevent all throat clearing. NO OIL BASED VITAMINS - use powdered substitutes.  Avoid fish oil when coughing.    Please remember to go to the  x-ray department  for your tests - we will call you with the results when they are available   Keep your previous appt   / see ENT asap

## 2019-09-15 NOTE — Progress Notes (Signed)
Radiation Oncology         [phone] ________________________________  Name: Brooke [name] MRN: [id number]  Date: 09/11/2019  DOB: [birth year]  Follow-Up Visit Note  CC: [name], MD  Helayne Seminole, MD  Diagnosis and Prior Radiotherapy:       ICD-10-CM   1. Malignant neoplasm of glottis ([place])  C32.0     CHIEF COMPLAINT:  Here for follow-up and surveillance of glottic cancer  Narrative:  The patient returns today for routine follow-up.  She has mild throat discomfort.  She also reports thick secretions in her throat.  She has some low back pain.  She recently recovered from constipation.  She continues to follow with swallowing therapy.  She continues to take all nutrition by mouth.  Her skin is healing.  She feels some swelling in her throat posttreatment.  Her main complaint is worsening shortness of breath.  She feels that she cannot breathe deeply.  She has a follow-up virtually with her pulmonologist in June.                    ALLERGIES:  is allergic to codeine; imdur [isosorbide dinitrate]; prednisone; and pulmicort [budesonide].  Meds: Current Outpatient Medications  Medication Sig Dispense Refill  . acetaminophen (TYLENOL) 325 MG tablet Per bottle as needed    . albuterol (PROVENTIL HFA;VENTOLIN HFA) 108 (90 BASE) MCG/ACT inhaler Inhale 2 puffs into the lungs every 4 (four) hours as needed for wheezing or shortness of breath. **PLAN B**    . ALPRAZolam (XANAX) 0.25 MG tablet 1/2-1 twice daily as needed  1  . atorvastatin (LIPITOR) 20 MG tablet TAKE 1 TABLET(20 MG) BY MOUTH DAILY AT 6 PM 90 tablet 3  . budesonide-formoterol (SYMBICORT) 160-4.5 MCG/ACT inhaler INHALE 2 PUFFS INTO THE LUNGS TWICE DAILY    . guaiFENesin (MUCINEX) 600 MG 12 hr tablet Take 600 mg by mouth 2 (two) times daily as needed for cough or to loosen phlegm.     Marland Kitchen ibuprofen (ADVIL,MOTRIN) 600 MG tablet Take 600 mg by mouth every 6 (six) hours as needed.    Marland Kitchen ipratropium-albuterol  (DUONEB) 0.5-2.5 (3) MG/3ML SOLN Take 3 mLs by nebulization every 4 (four) hours as needed (wheezing/shortness of breath). **PLAN C**    . lidocaine (XYLOCAINE) 2 % solution Patient: Mix 1part 2% viscous lidocaine, 1part H20. Swallow 32mL of diluted mixture, 59min before meals and at bedtime, up to QID 200 mL 3  . NON FORMULARY [facility]  Onychomycosis Nail Lacquer -  Fluconazole 2%, Terbinafine 1% DMSO/undecylenic acid 25% Apply to affected nail once daily Qty. 120 gm 3 refills    . OXYGEN 2lpm 24/7    . Simethicone (GAS RELIEF) 180 MG CAPS Use as needed    . nitroGLYCERIN (NITROSTAT) 0.4 MG SL tablet ONE TABLET UNDER TONGUE AS NEEDED FOR CHEST PAIN EVERY 5 MINUTES FOR 3 DOSES (Patient not taking: Reported on 09/11/2019) 25 tablet 3   No current facility-administered medications for this encounter.    Physical Findings: The patient is in no acute distress. Patient is alert and oriented. Wt Readings from Last 3 Encounters:  09/11/19 97 lb (44 kg)  07/03/19 100 lb 12.8 oz (45.7 kg)  03/31/19 100 lb 8 oz (45.6 kg)    height is 5' 2.5" (1.588 m) and weight is 97 lb (44 kg). Her temperature is 97.8 F (36.6 C). Her blood pressure is 148/72 (abnormal) and her pulse is 92. Her respiration is 18 and oxygen  saturation is 100%. .  General: Alert and oriented, in no acute distress HEENT: Head is normocephalic. Extraocular movements are intact. Oropharynx is notable for no lesions neck:  Neck is notable for erythema in anterior neck, skin intact Skin: Skin in treatment fields shows satisfactory healing  Chest: Markedly decreased breath sounds bilaterally, more decreased than usual.  No wheezes  Psychiatric: Judgment and insight are intact. Affect is appropriate.   Lab Findings: Lab Results  Component Value Date   WBC 6.7 12/23/2018   HGB 13.5 12/23/2018   HCT 41.0 12/23/2018   MCV 87.0 12/23/2018   PLT 241 12/23/2018    Lab Results  Component Value Date   TSH 0.597 01/15/2019     Radiographic Findings: No results found.  Impression/Plan:    1) Head and Neck Cancer Status: Healing from radiotherapy  2) Nutritional Status: Relatively stable, though she is underweight.  We again discussed nutritional techniques to boost her calories and protein  PEG tube: None  3) Risk Factors: The patient has been educated about risk factors including alcohol and tobacco abuse; they understand that avoidance of alcohol and tobacco is important to prevent recurrences as well as other cancers  4) Swallowing: Continue swallowing therapy  5)  Thyroid function: Check annually Lab Results  Component Value Date   TSH 0.597 01/15/2019    6)  Other: She has chronic lung issues.  Subjectively she feels more short of breath.  Her breath sounds are more decreased than usual today.  Our nurse will help her move up her pulmonology appointment and see if they can get her an appointment in person  7) follow-up in October.  We will also refer her back to otolaryngology for laryngoscopy in 2 to 3 months  On date of service, in total, I spent 25 minutes on this encounter.  Patient seen in person. _____________________________________   Eppie Gibson, MD

## 2019-09-16 ENCOUNTER — Encounter: Payer: Self-pay | Admitting: Internal Medicine

## 2019-09-16 NOTE — Assessment & Plan Note (Signed)
rx 2lpm hs and prn daytime since 2014  -  06/13/2018   Walked on 2lpm x  3 laps @  approx 251ft each @ nl pace  stopped due to  Sob after each lap  s desat     rec as of 09/15/2019  = 2lpm hs and prn with activity with goal of keeping sats > 90% but needs to learn to pace herself on her own or consider option of pulmonary rehab

## 2019-09-16 NOTE — Progress Notes (Signed)
Spoke with pt and notified of results per Dr. Wert. Pt verbalized understanding and denied any questions. 

## 2019-09-16 NOTE — Assessment & Plan Note (Signed)
Quit smoking 2009 Spirometry 02/15/2016  FEV1 0.60 (28%)  Ratio 38 with classic curvature  p am symbicort 160  - 01/08/2017    continue dulera 200 2bid  - 12/09/2017  After extensive coaching inhaler device  effectiveness =    90% with smi >add spiriva to  symbicort  - 12/12/2017 informed could not tol spiriva due to dry mouth - - 06/13/2018  After extensive coaching inhaler device,  effectiveness =    75% > continue symbicort 160  This is does not look at all like copd flare but she's convinced it is and better p last rx so repeat depomedrol 120 IM and levquin 500 mg daily x 5 days   No change maint rx for copd

## 2019-09-16 NOTE — Assessment & Plan Note (Signed)
11/30/220 televisit, new hoarse rec  gerd rx > ENT F/u > sq cell ca vc   Last RT April 20th 2021 in [place]/ [facility]   Present symptoms due to upper airway inflammation / ? Residual ca   >>>  rx max gerd, depomedrol 120 mg IM and f/u ENT strongly rec   I had an extended discussion with the patient and reviewed all relevant studies so total time was 30 minutes with moderate level of MDM.  Each maintenance medication was reviewed in detail including most importantly the difference between maintenance and prns and under what circumstances the prns are to be triggered using an action plan format that is not reflected in the computer generated alphabetically organized AVS.     Please see AVS for specific instructions unique to this visit that I personally wrote and verbalized to the the pt in detail and then reviewed with pt  by my nurse highlighting any  changes in therapy recommended at today's visit to their plan of care.

## 2019-09-17 NOTE — Progress Notes (Signed)
Oncology Nurse Navigator Documentation  Per patient's 09/11/19 post-treatment follow-up with Dr. Isidore Moos, sent fax to [facility] ENT Scheduling with request Ms. Ingles be contacted and scheduled for routine post-RT follow-up with Dr. Blenda Nicely in late July.  Notification of successful fax transmission received.   Harlow Asa RN, BSN, OCN Head & Neck Oncology Nurse [place] at [facility] Phone # [phone]  Fax # [phone]

## 2019-09-22 NOTE — Progress Notes (Signed)
Oncology Nurse Navigator Documentation  I spoke with Ms. Oros regarding her appointment with Garald Balding on 09/24/19 at 2:00. She voiced that she is aware of this appointment and plans to attend. She knows to call me if she has any further questions or concerns.   Harlow Asa RN, BSN, OCN Head & Neck Oncology Nurse [place] at [facility] Phone # [phone]  Fax # [phone]

## 2019-09-24 ENCOUNTER — Ambulatory Visit: Payer: Medicare Other | Attending: Otolaryngology

## 2019-09-24 ENCOUNTER — Other Ambulatory Visit: Payer: Self-pay

## 2019-09-24 DIAGNOSIS — R490 Dysphonia: Secondary | ICD-10-CM | POA: Insufficient documentation

## 2019-09-24 NOTE — Therapy (Signed)
[facility] [address] [place], [place], [address] Phone: [phone]   Fax:  [phone]  Speech Language Pathology Treatment  Patient Details  Name: Brooke [name] MRN: [id number] Date of Birth: Dec 08, 1945 Referring Provider (SLP): Helayne Seminole., MD   Encounter Date: 09/24/2019  End of Session - 09/24/19 1453    Visit Number  3    Number of Visits  4    Date for SLP Re-Evaluation  10/07/19    SLP Start Time  1406    SLP Stop Time   1448    SLP Time Calculation (min)  42 min    Activity Tolerance  Patient tolerated treatment well       Past Medical History:  Diagnosis Date  . Chronic respiratory failure (HCC)    a. on home O2.  Marland Kitchen COPD (chronic obstructive pulmonary disease) ([place])    a. Home O2.  . Former tobacco use   . GERD (gastroesophageal reflux disease)   . Hyperlipidemia   . Liver metastases ([place])   . lung ca 10/2016  . 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 NSTEMI (non-ST elevated myocardial infarction) ([place])    a. 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 Coronary vasospasm    Past Surgical History:  Procedure Laterality Date  . LEFT HEART CATHETERIZATION WITH CORONARY ANGIOGRAM N/A 06/08/2013   Procedure: LEFT HEART CATHETERIZATION WITH CORONARY ANGIOGRAM;  Surgeon: Sanda Klein, MD;  Location: [facility] CATH LAB;  Service: Cardiovascular;  Laterality: N/A;    There were no vitals filed for this visit.  Subjective Assessment - 09/24/19 1425    Subjective  It's exhausting having to bring air through and around this mask." Pt aphonic today.    Currently in Pain?  No/denies            ADULT SLP TREATMENT - 09/24/19 1414      General Information   Behavior/Cognition  Alert;Cooperative;Pleasant mood      Treatment Provided   Treatment provided  Dysphagia      Dysphagia Treatment   Temperature Spikes Noted  No    Respiratory Status  Nasal cannula    Oral Cavity - Dentition  Adequate natural dentition    Treatment Methods  Therapeutic exercise;Skilled observation;Patient/caregiver education    Patient observed directly with PO's  Yes    Type of PO's observed  Dysphagia 3 (soft);Thin liquids    Feeding  Able to feed self    Liquids provided via  Cup    Oral Phase Signs & Symptoms  --   nothing noted today    Pharyngeal Phase Signs & Symptoms  Other (comment)   nothing noted   Other treatment/comments  Pt maintained a more relaxed vocal quality with SLP today - diffiuclt to ascertain if "confidential" due ot aphonia. States she coughs rarely with liquids but did not see today with x7 sips. No overt s/sx aspiration PNA. Pt reports she is eating soft/mech soft foods (sounds like many dys III items) and also supplements like Ensure, Glucerna. Pt ws independent on procedure for HEP to maintain proper laryngeal movement and not lose ROM and pitch range. Pt reports she is disappointed with the lack of voice at present - stated her ENT follow up is in July. SLP suggested pt could call to see if she could get in sooner but pt would rather stay with Dr. Blenda Nicely who is currently on maternity leave.  Assessment / Recommendations / Plan   Plan  Continue with current plan of care      Dysphagia Recommendations   Diet recommendations  Thin liquid;Dysphagia 3 (mechanical soft)    Liquids provided via  Cup    Medication Administration  --   as tolerated   Compensations  Slow rate      Progression Toward Goals   Progression toward goals  Progressing toward goals       SLP Education - 09/24/19 1625    Education Details  cont reduced rate with POs    Person(s) Educated  Patient    Methods  Explanation    Comprehension  Verbalized understanding       SLP Short Term Goals - 09/24/19 1627      SLP SHORT TERM GOAL #1   Title  Pt will use "confidential tone" in 5 minutes simple conversation in 2 ST  sessions    Period  --   sessions, for all STGs   Status  Achieved      SLP SHORT TERM GOAL #2   Title  pt will complete voice HEP with rare min A    Status  Achieved       SLP Long Term Goals - 09/24/19 1628      SLP LONG TERM GOAL #1   Title  pt perform HEP with modified independence x3 sessions    Baseline  08-20-19, 09-24-19    Time  1    Period  --   sessions, for all LTGs   Status  On-going      SLP LONG TERM GOAL #2   Title  pt will use "confidential voice" in 10 minutes simple conversation in 3 sessinos with SLP    Baseline  09-24-19    Time  1    Status  On-going       Plan - 09/24/19 1626    Clinical Impression Statement  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 SLP reviewed voice (and swallow) exerise program today with pt, with which she completed with independence. Pt reports rare coughing with liquids however pt did not do so today - states she does not do so when she remains with a slow eating pace. SLP believes pt would cont to benefit from skilled ST once approx every four weeks x3-4 sessions to ensure pt is performing HEP correctly. Pt may require more frequency skilled ST after her radiation is completed for work coordinating abdominal breathing and speech, as well as a focus on vocal hygiene.    Speech Therapy Frequency  --   approx once every 4 weeks   Duration  --   3 visits   Treatment/Interventions  Other (comment);Multimodal communcation approach;SLP instruction and feedback;Patient/family education;Internal/external aids   voice exercises   Potential to Achieve Goals  Good    SLP Home Exercise Plan  provided today    Consulted and Agree with Plan of Care  Patient       Patient will benefit from skilled therapeutic intervention in order to improve the following deficits and impairments:   Hoarseness    Problem List Patient Active Problem List   Diagnosis Date Noted  . Malignant neoplasm  of glottis ([place]) 07/14/2019  . Hoarseness 04/07/2019  . Ingrown right big toenail 03/16/2018  . Goals of care, counseling/discussion 12/05/2016  . 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 Protein-calorie malnutrition, severe  11/02/2016  . SOB (shortness of breath)   . Community acquired pneumonia of left upper lobe of lung 10/22/2016  . COPD exacerbation ([place]) 10/22/2016  . Hypokalemia 10/22/2016  . Microcytic anemia 10/22/2016  . Mass of upper lobe of left lung 02/15/2016  . Chronic respiratory failure with hypoxia ([place]) 02/15/2016  . GERD (gastroesophageal reflux disease)   . Former tobacco use   . HLD (hyperlipidemia) 06/08/2013  . NSTEMI (non-ST elevated myocardial infarction) ([place]) 06/05/2013  . COPD  GOLD IV  06/05/2013    [facility] ,[place], CCC-SLP  09/24/2019, 4:29 PM  [facility] [address] [place], [place], [address] Phone: [phone]   Fax:  [phone]   Name: Brooke [name] MRN: [id number] Date of Birth: Sep 06, 1945

## 2019-09-24 NOTE — Patient Instructions (Signed)
Continue to go slow with your pace while you eat and drink.

## 2019-09-25 NOTE — Progress Notes (Signed)
Oncology Nurse Navigator Documentation  I met with Ms. Sickinger after her appointment with Garald Balding for STP. She is feeling well. She had multiple questions regarding her transportation to and from the cancer center. I explained that [facility] [place] SW planned to call her and discuss our transportation services to her relating to her upcoming appointment with Dr. Isidore Moos. She knows to call me if they have any further questions or concerns.   Harlow Asa RN, BSN, OCN Head & Neck Oncology Nurse [place] at [facility] Phone # [phone]  Fax # [phone]

## 2019-09-29 ENCOUNTER — Inpatient Hospital Stay: Payer: Medicare Other | Attending: Oncology | Admitting: Oncology

## 2019-09-29 ENCOUNTER — Other Ambulatory Visit: Payer: Self-pay

## 2019-09-29 ENCOUNTER — Telehealth: Payer: Self-pay | Admitting: Oncology

## 2019-09-29 VITALS — BP 133/64 | HR 83 | Temp 98.6°F | Resp 18 | Ht 62.75 in | Wt 90.6 lb

## 2019-09-29 DIAGNOSIS — R21 Rash and other nonspecific skin eruption: Secondary | ICD-10-CM | POA: Insufficient documentation

## 2019-09-29 DIAGNOSIS — J440 Chronic obstructive pulmonary disease with acute lower respiratory infection: Secondary | ICD-10-CM | POA: Diagnosis not present

## 2019-09-29 DIAGNOSIS — C7889 Secondary malignant neoplasm of other digestive organs: Secondary | ICD-10-CM | POA: Diagnosis not present

## 2019-09-29 DIAGNOSIS — Z79899 Other long term (current) drug therapy: Secondary | ICD-10-CM | POA: Diagnosis not present

## 2019-09-29 DIAGNOSIS — Z9981 Dependence on supplemental oxygen: Secondary | ICD-10-CM | POA: Diagnosis not present

## 2019-09-29 DIAGNOSIS — C3412 Malignant neoplasm of upper lobe, left bronchus or lung: Secondary | ICD-10-CM

## 2019-09-29 DIAGNOSIS — I252 Old myocardial infarction: Secondary | ICD-10-CM | POA: Insufficient documentation

## 2019-09-29 DIAGNOSIS — D098 Carcinoma in situ of other specified sites: Secondary | ICD-10-CM | POA: Diagnosis not present

## 2019-09-29 NOTE — Telephone Encounter (Signed)
Scheduled appt per 5/25 los - gave patient AVS and calender per  [facility]

## 2019-09-29 NOTE — Progress Notes (Signed)
[facility] OFFICE PROGRESS NOTE   Diagnosis: Non-small cell lung cancer, head neck cancer  INTERVAL HISTORY:   Brooke [name] returns as scheduled.  She completed radiation to the larynx on 08/25/2019.  She had a stable chest x-ray on 09/15/2019.  She reports persistent hoarseness.  She developed odynophagia following radiation.  This has improved.  She lost weight and is now drinking 2 Ensure or boost supplements daily.  She had discomfort in the left lower abdomen after becoming constipated.  This has improved.  She saw Dr. Melvyn Novas with an increased cough on 09/15/2019.  She was treated with a steroid injection and Levaquin.  Objective:  Vital signs in last 24 hours:  Blood pressure 133/64, pulse 83, temperature 98.6 F (37 C), temperature source Temporal, resp. rate 18, height 5' 2.75" (1.594 m), weight 90 lb 9.6 oz (41.1 kg), SpO2 100 %.    Lymphatics: No cervical, supraclavicular, or axillary nodes Resp: Bronchial sounds at the left upper chest, no respiratory distress Cardio: Regular rate and rhythm GI: No hepatosplenomegaly, firm fullness in the lateral left mid abdomen, no hepatosplenomegaly Vascular: No leg edema     Lab Results:  Lab Results  Component Value Date   WBC 6.7 12/23/2018   HGB 13.5 12/23/2018   HCT 41.0 12/23/2018   MCV 87.0 12/23/2018   PLT 241 12/23/2018   NEUTROABS 5.0 12/23/2018    CMP  Lab Results  Component Value Date   NA 140 07/02/2019   K 3.8 07/02/2019   CL 105 07/02/2019   CO2 27 07/02/2019   GLUCOSE 92 07/02/2019   BUN 9 07/02/2019   CREATININE 0.63 07/02/2019   CALCIUM 9.2 07/02/2019   PROT 7.3 07/02/2019   ALBUMIN 3.7 07/02/2019   AST 16 07/02/2019   ALT 12 07/02/2019   ALKPHOS 76 07/02/2019   BILITOT 0.4 07/02/2019   GFRNONAA >60 07/02/2019   GFRAA >60 07/02/2019    Medications: I have reviewed the patient's current medications.   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  PDL1 60%  Left lung radiation 11/08/2016 through 11/21/2016  CT chest 12/12/2016-reexpansion of left upper lobe with a decreased left upper lobe mass, unchanged mediastinal adenopathy, progression of a splenic metastasis/pancreatic tail/gastrohepatic ligament metastasis, right lower lobe pneumonia  Cycle 1 Pembrolizumab 12/14/2016  Cycle 2 Pembrolizumab 01/03/2017  Cycle 3 Pembrolizumab 01/24/2017  Cycle 4 Pembrolizumab 02/12/2017  CT 03/05/2018-decrease in left upper lobe mass, mediastinal adenopathy, and splenic mass  Cycle 5 pembrolizumab 03/07/2017  Cycle 6 Pembrolizumab 04/02/2017  Cycle 7 pembrolizumab 04/25/2017  Cycle 8 Pembrolizumab 05/14/2017  Cycle 9 Pembrolizumab 06/06/2017  CT 06/20/2017-mild decrease in left upper lobe mass, mild increase in paramediastinal left upper lobe nodule-radiation change?, decreased splenic metastasis  Cycle 10 pembrolizumab 06/25/2017  Cycle 11 pembrolizumab 07/18/2017  Cycle 12 pembrolizumab 08/29/2017  Cycle 13 pembrolizumab 09/17/2017  Cycle 14 Pembrolizumab 10/10/2017  CT chest 10/24/2017-slight enlargement of small mediastinal lymph nodes, increased left upper lobe consolidation, decreased size of spleen lesion  Cycle 15 pembrolizumab 10/29/2017  Cycle 16 Pembrolizumab 11/21/2017  Cycle 17 Pembrolizumab 12/11/2017  Cycle 18 pembrolizumab 01/02/2018  Cycle 19 Pembrolizumab 01/21/2018  Cycle 20  Pembrolizumab 02/13/2018  CT chest 02/27/2018-decreased left paratracheal node, progressive consolidation/fibrosis in the left upper lung, decreased size  of splenic mass  Cycle 21 pembrolizumab 03/04/2018  Cycle 22 Pembrolizumab 03/27/2018  Cycle 23 pembrolizumab 04/15/2018  Cycle 24 pembrolizumab 05/08/2018  Cycle 25 Pembrolizumab 05/27/2018  CT chest 06/16/2018-stable left upper lung radiation fibrosis with no evidence of local tumor recurrence. Decreased left paratracheal adenopathy. No new or progressive metastatic disease in the chest. Gastrohepatic ligament lymphadenopathy stable. Splenic metastasis decreased. T5 vertebral compression fracture with associated patchy sclerosis and no discrete osseous lesion, new in the interval.  Cycle 26 Pembrolizumab 06/19/2018  Cycle 27 pembrolizumab 07/08/2018  Cycle 28 pembrolizumab 07/31/2018  Cycle 29 pembrolizumab 08/19/2018  Cycle 30 Pembrolizumab 09/11/2018  Cycle 31 pembrolizumab 09/30/2018  Cycle 32 pembrolizumab 10/23/2018  CT chest 10/28/2018-left apical pleural-parenchymal opacity consistent with radiation scarring has become more confluent likely representing evolutionary change. Continued further decrease in size of index left paratracheal node and splenic metastasis. Gastrohepatic ligament lymph node not changed.  Cycle 33 Pembrolizumab 11/11/2018  Cycle 34 pembrolizumab 12/04/2018  Cycle 35 pembrolizumab 12/23/2018  CT chest 02/12/2019-posttreatment changes left upper lobe unchanged. Continued decrease in size of splenic metastasis. Stable appearance of left paratracheal lymph nodes. Stable gastrohepatic adenopathy. Incomplete visualization of retroperitoneal lymph nodes in the upper abdomen.  CT chest 07/02/2019-stable post treatment related changes left lung.  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  Slight regression of enlarged likely metastatic gastrohepatic lymph node.   2. 05/29/2019 laryngoscopy-exophytic appearing mass mid right vocal cord.    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  Tumor cells negative for p16, CMV and HSV 2.  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   Radiation 07/29/2019-08/25/2019  3. History of acute respiratory failure secondary to #1  4. Oxygen dependent COPD  5. History of a NSTEMI 2015  6. Right lung pneumonia on chest CT 12/12/2016-treated with Levaquin  7.Grade 2 rash possibly related to Pembrolizumab. Treatment held 08/06/2017.Improved 08/29/2017, treatment resumed, rash has resolved    Disposition: Brooke [name] completed radiation for the laryngeal cancer.  She continues to recover from treatment.  I encouraged her to work on increasing her weight.  There is no clinical evidence for progression of the non-small cell lung cancer.  She will follow up with radiation oncology and ENT for management of the vocal cord cancer.  Ms. Brooker will be scheduled for an office visit and restaging chest CT in approximately 2 months.  The etiology of the firm fullness in the left mid abdomen is unclear.  This area should be captured in the chest CT.  She will call for increased abdominal pain.  Betsy Coder, MD  09/29/2019  10:04 AM

## 2019-10-05 NOTE — Progress Notes (Signed)
  Patient Name: Brooke [name] MRN: [id number] DOB: 24-May-1945 Referring Physician: Lind Guest Date of Service: 08/25/2019 [facility], [place]                                                        End Of Treatment Note  Diagnoses: C32.0-Malignant neoplasm of glottis C34.12-Malignant neoplasm of upper lobe, left bronchus or lung  Cancer Staging: Cancer Staging Malignant neoplasm of glottis [facility]) Staging form: Larynx - Glottis, AJCC 8th Edition - Clinical stage from 07/14/2019: Stage 0 (cTis, cN0, cM0) - Signed by Eppie Gibson, MD on 07/14/2019  Intent: Curative  Radiation Treatment Dates: 07/29/2019 through 08/25/2019  Site Technique Total Dose (Gy) Dose per Fx (Gy) Completed Fx Beam Energies  Larynx: HN_larynx 3D 54/54 2.7 20/20 6X   Narrative: The patient tolerated radiation therapy relatively well.   Plan: The patient will follow-up with radiation oncology in the next 2-3 weeks.  -----------------------------------  Eppie Gibson, MD

## 2019-10-09 ENCOUNTER — Ambulatory Visit: Payer: Medicare Other | Admitting: Internal Medicine

## 2019-10-16 ENCOUNTER — Other Ambulatory Visit: Payer: Self-pay

## 2019-10-16 ENCOUNTER — Encounter: Payer: Self-pay | Admitting: Internal Medicine

## 2019-10-16 ENCOUNTER — Ambulatory Visit (INDEPENDENT_AMBULATORY_CARE_PROVIDER_SITE_OTHER): Payer: Medicare Other | Admitting: Internal Medicine

## 2019-10-16 DIAGNOSIS — R490 Dysphonia: Secondary | ICD-10-CM | POA: Diagnosis not present

## 2019-10-16 DIAGNOSIS — J449 Chronic obstructive pulmonary disease, unspecified: Secondary | ICD-10-CM | POA: Diagnosis not present

## 2019-10-16 DIAGNOSIS — J9611 Chronic respiratory failure with hypoxia: Secondary | ICD-10-CM

## 2019-10-16 NOTE — Patient Instructions (Addendum)
2lpm bedtime but goal is > 90% saturation 24/7 so adjust during the day   Follow up with ENT as soon as you can    Please schedule a follow up visit in 3 months but call sooner if needed

## 2019-10-16 NOTE — Progress Notes (Signed)
Subjective:    Patient ID: Brooke [name], female   DOB: Jun 21, 1945,    MRN: [id number]     Brief patient profile:  69 yowf quit smoking 2009 on 02 noct 2012 maint on symbicort 160 since aorund  2014 referred to pulmonary clinic 02/15/2016 by Dr   Kathryne Eriksson for RUL Lung mass dx with Stage IV nsc 11/01/16 in setting of GOLD IV copd      History of Present Illness  02/15/2016 1st [facility] office visit/ Seena Face  GOLD IV copd/ symb 160 2bid maint  Chief Complaint  Patient presents with  . Pulmonary Consult    referred by Dr. Kathryne Eriksson for COPD.  MMRC2 = can't walk a nl pace on a flat grade s sob but does fine slow and flat eg [employer] on 2lpm  New onset bloody mucus plugs November 11 2015 assoc with wt loss  02 2lpm hs and with walking / rare saba needed rec For cough > mucinex up to 1200 mg every 12 hours as needed Rec:  Fob but refused   11/01/16   CT directed bx >>>  Pos NSC LUL    Diagnosis:   Stage IV NSCLC, Squamous cell carcinoma of the left upper lobe.     Indication for treatment:  palliative       Radiation treatment dates:   11/08/2016 to 11/21/2016  Site/dose:   The Left lung was treated to 30 Gy in 10 fractions at 3 Gy per fraction.   Beams/energy:   3D // 10X, 6X    12/06/2016  f/u ov/Aurore Redinger re: post hosp f/u -  Now on dulera 200 bid and duoneb Chief Complaint  Patient presents with  . HFU    Breathing is doing well. She is using Duoneb 2 x daily and uses albuterol inhaler 1-2 x per wk on average.   presently in SNF ever since last admit 6/18-29/18 for  Active Problems:   COPD  GOLD IV    Mass of upper lobe of left lung   Chronic respiratory failure with hypoxia (HCC)   COPD exacerbation (HCC)   Hypokalemia   Microcytic anemia   SOB (shortness of breath)   Protein-calorie malnutrition, severe Doe = 2lpm x 119ft with rolling walking  Sleeping ok at < 30 degrees Doe = 2lpm x 115ft with rolling walker/2lpm Sleeping ok at < 30 degrees   rec 02 can be adjusted down and off for saturations above 90%       12/09/2017  f/u ov/Lorriane Dehart re: GOLD IV copd/  No 02 at rest/ 2lpm sleep and with activity  Chief Complaint  Patient presents with  . Follow-up    Breathing is overall doing well. She has occ cough with white sputum- relates to PND.  She uses her albuterol inhaler 2 x daily on average. She states she uses neb before she knows she is going somewhere.   Dyspnea:  MMRC2 = can't walk a nl pace on a flat grade s sob but does fine slow and flat eg still pushing cart  Cough: min pnd  SABA use: as above  rec Continue  symbicort and add stiolto 2 pffs each am - - if you don't like it for any reason resume the symbicort alone as per your med calendar  Please schedule a follow up visit in 3 months but call sooner if needed  - add:  Pt notified above typo should have said add spiriva x 2 pffs each am  and continue symbicort Take 2 puffs first thing in am and then another 2 puffs about 12 hours later.   - 12/12/2017 informed could not tol spiriva due to dry mouth   03/14/2018  f/u ov/Glada Wickstrom re:  GOLD IV / 02 is 2lpm  Hs and 2lpm exertion/   RA sitting / just on symbicort 160 2bid  Chief Complaint  Patient presents with  . Follow-up    increased cough with white sputum. She states her breathing has been doing well. She is using her albuterol inhaler 2 x per wk and neb maybe 2 x  per wk on average.    Dyspnea:  [employer] ok pushing cart on 2lpm  Cough: more since 2 weeks  Sleeping: flat bed/ 2-3 pillow  SABA use: avg as above 02: as above   rec Work on inhaler technique:   Ok to take augmentin unless it makes you nauseated  Please schedule a follow up visit in 3 months but call sooner if needed      06/13/2018  f/u ov/Avaiah Stempel re: GOLD IV / 02 hs and prn  Chief Complaint  Patient presents with  . Shortness of Breath    Worse since the last visit.  Dyspnea:  Still doing [employer]  On 2lpm but not checking sats as rec with activity   Cough: dry worse day than noct Sleeping: falt bed / couple of pillows SABA use: helps some / uses once a day esp in am 02: 2lpm hs and prn  rec Plan A = Automatic = symbicort 160 Take 2 puffs first thing in am and then another 2 puffs about 12 hours later.  Work on inhaler technique: Plan B = Backup Only use your albuterol inhaler as a rescue medication Plan C = Crisis - only use your albuterol/ipatropium nebulizer if you first try Plan B and it fails to help > ok to use the nebulizer up to every 4 hours but if start needing it regularly call for immediate appointment Please schedule a follow up visit in 3 months but call sooner if needed  - add chart review does not show alpha one screen done    03/05/2019 acute ext ov/[facility] re:  GOLD IV / 02 dep  Chief Complaint  Patient presents with  . Acute Visit    Increased cough and SOB for the past 2 wks. She has already been treated with zpack. She is using her albuterol inhaler several x per day and neb a couple times per day.   Dyspnea:  Getting worse x months but esp x 2 weeks  Cough: yellow mucus x 2 weeks and no change p zpak  Sleeping: bed is flat, sev pillows SABA use: some better  02: 2lpm 24/7  rec Depomedrol 120 mg IM today  Levaquin 500 mg daily x 7 days  Please remember to go to the  x-ray department  for your tests - we will call you with the results when they are available     11/30/220 televisit, new hoarse rec  gerd rx > ENT F/u > sq cell ca vc   Last RT April 20th 2021 in [place]    09/15/2019  f/u ov/Kairen Hallinan re: worse sob and hoarseness p RT  Chief Complaint  Patient presents with  . Follow-up    Pt c/o increased SOB and coughing up frothy sputum x 3 wks- relates to RT.    Dyspnea:  Across the room x 3 weeks Cough: more frothy Sleeping:  flat bed 3 pillows  SABA use: hfa and duoneb  02: 2lpm hs and walking and not at rest  Eating ok / no choking on  Food but feels something stuck in throat making swallowing  difficult rec Depomedrol 120 mg IM today  Levaquin 500 mg daily x 7 days  Try prilosec otc 20mg   Take 30-60 min before first meal of the day and Pepcid ac (famotidine) 20 mg one @  bedtime until cough is completely gone for at least a week without the need for cough suppression GERD Keep your previous appt   / see ENT asap    10/16/2019  f/u ov/Kiren Mcisaac re: GOLD IV copd/ 02 dep / has not seen ent  Chief Complaint  Patient presents with  . Follow-up    COPD, no new issues  Dyspnea:  60 ft x 10 laps s  stopping on 2lpm and says sats stay in 90s   Cough: better  Sleeping: bed is flat/ 3 pillows  SABA use: occ  02: 2lpm 24/7  L cp comes and goes positional no pleuritic or ex related    No obvious day to day or daytime variability or assoc excess/ purulent sputum or mucus plugs or hemoptysis or cp or chest tightness, subjective wheeze or overt sinus or hb symptoms.   Sleeping  without nocturnal  or early am exacerbation  of respiratory  c/o's or need for noct saba. Also denies any obvious fluctuation of symptoms with weather or environmental changes or other aggravating or alleviating factors except as outlined above   No unusual exposure hx or h/o childhood pna/ asthma or knowledge of premature birth.  Current Allergies, Complete Past Medical History, Past Surgical History, Family History, and Social History were reviewed in [employer] record.  ROS  The following are not active complaints unless bolded Hoarseness, sore throat, dysphagia improved on gerd rx, dental problems, itching, sneezing,  nasal congestion or discharge of excess mucus or purulent secretions, ear ache,   fever, chills, sweats, unintended wt loss or wt gain, classically pleuritic or exertional cp,  orthopnea pnd or arm/hand swelling  or leg swelling, presyncope, palpitations, abdominal pain, anorexia, nausea, vomiting, diarrhea  or change in bowel habits or change in bladder habits, change in stools or  change in urine, dysuria, hematuria,  rash, arthralgias, visual complaints, headache, numbness, weakness or ataxia or problems with walking or coordination,  change in mood or  memory.        Current Meds  Medication Sig  . acetaminophen (TYLENOL) 325 MG tablet Per bottle as needed  . albuterol (PROVENTIL HFA;VENTOLIN HFA) 108 (90 BASE) MCG/ACT inhaler Inhale 2 puffs into the lungs every 4 (four) hours as needed for wheezing or shortness of breath. **PLAN B**  . ALPRAZolam (XANAX) 0.25 MG tablet 1/2-1 twice daily as needed  . atorvastatin (LIPITOR) 20 MG tablet TAKE 1 TABLET(20 MG) BY MOUTH DAILY AT 6 PM  . budesonide-formoterol (SYMBICORT) 160-4.5 MCG/ACT inhaler INHALE 2 PUFFS INTO THE LUNGS TWICE DAILY  . docusate sodium (COLACE) 100 MG capsule Take 100-200 mg by mouth daily.  Marland Kitchen guaiFENesin (MUCINEX) 600 MG 12 hr tablet Take 600 mg by mouth 2 (two) times daily as needed for cough or to loosen phlegm.   Marland Kitchen ibuprofen (ADVIL,MOTRIN) 600 MG tablet Take 600 mg by mouth every 6 (six) hours as needed.  Marland Kitchen ipratropium-albuterol (DUONEB) 0.5-2.5 (3) MG/3ML SOLN Take 3 mLs by nebulization every 4 (four) hours as needed (wheezing/shortness of breath). **PLAN  C**  . nitroGLYCERIN (NITROSTAT) 0.4 MG SL tablet ONE TABLET UNDER TONGUE AS NEEDED FOR CHEST PAIN EVERY 5 MINUTES FOR 3 DOSES  . NON FORMULARY [facility]  Onychomycosis Nail Lacquer -  Fluconazole 2%, Terbinafine 1% DMSO/undecylenic acid 25% Apply to affected nail once daily Qty. 120 gm 3 refills  . OXYGEN 2lpm 24/7  . Psyllium (METAMUCIL PO) Take by mouth daily.  . Simethicone (GAS RELIEF) 180 MG CAPS Use as needed  . [DISCONTINUED] lidocaine (XYLOCAINE) 2 % solution Patient: Mix 1part 2% viscous lidocaine, 1part H20. Swallow 96mL of diluted mixture, 76min before meals and at bedtime, up to QID                 Objective:   Physical Exam  Now ambulatory (was w/c last ov)  wf still extremely hoarse     10/16/2019          93   03/05/2019      99  06/13/2018         123  03/14/2018       127  12/09/2017         125   06/10/2017         119  01/08/2017         112   12/06/2016        110   02/15/16 119 lb (54 kg)  08/18/15 137 lb 12.8 oz (62.5 kg)  02/17/15 138 lb 1.9 oz (62.7 kg)    Vital signs reviewed  10/16/2019  - Note at rest 02 sats  99% on 2lpm liquid/ pulses    HEENT : pt wearing mask not removed for exam due to covid -19 concerns.    NECK :  without JVD/Nodes/TM/ nl carotid upstrokes bilaterally   LUNGS: no acc muscle use,  Mod barrel  contour chest wall with bilateral  Distant bs s audible wheeze and  without cough on insp or exp maneuvers and mod  Hyperresonant  to  percussion bilaterally     CV:  RRR  no s3 or murmur or increase in P2, and no edema   ABD:  soft and nontender with pos mid insp Hoover's  in the supine position. No bruits or organomegaly appreciated, bowel sounds nl  MS:     ext warm without deformities, calf tenderness, cyanosis or clubbing No obvious joint restrictions   SKIN: warm and dry without lesions    NEURO:  alert, approp, nl sensorium with  no motor or cerebellar deficits apparent.            Assessment:

## 2019-10-17 ENCOUNTER — Encounter: Payer: Self-pay | Admitting: Internal Medicine

## 2019-10-17 NOTE — Assessment & Plan Note (Signed)
Quit smoking 2009 Spirometry 02/15/2016  FEV1 0.60 (28%)  Ratio 38 with classic curvature  p am symbicort 160  - 01/08/2017    continue dulera 200 2bid  - 12/09/2017  After extensive coaching inhaler device  effectiveness =    90% with smi >add spiriva to  symbicort  - 12/12/2017 informed could not tol spiriva due to dry mouth - - 06/13/2018  After extensive coaching inhaler device,  effectiveness =    75% > continue symbicort 160  Symptoms improved on gerd rx likely due to large component of upper airway swelling from RT so still very sensitive to any LPR > should continue and f/u with ent as previously rec  advsed re saba: I spent extra time with pt today reviewing appropriate use of albuterol for prn use on exertion with the following points: 1) saba is for relief of sob that does not improve by walking a slower pace or resting but rather if the pt does not improve after trying this first. 2) If the pt is convinced, as many are, that saba helps recover from activity faster then it's easy to tell if this is the case by re-challenging : ie stop, take the inhaler, then p 5 minutes try the exact same activity (intensity of workload) that just caused the symptoms and see if they are substantially diminished or not after saba 3) if there is an activity that reproducibly causes the symptoms, try the saba 15 min before the activity on alternate days   If in fact the saba really does help, then fine to continue to use it prn but advised may need to look closer at the maintenance regimen being used to achieve better control of airways disease with exertion.

## 2019-10-17 NOTE — Assessment & Plan Note (Signed)
11/30/220 televisit, new hoarse rec  gerd rx > ENT F/u > sq cell ca vc   Last RT April 20th 2021 in [place]   Again rec ent eval as needs ongoing observation of upper airway for response to RT but also possibility of RT fibrosis.but continue max gerd rx in meantime

## 2019-10-17 NOTE — Assessment & Plan Note (Signed)
rx 2lpm hs and prn daytime since 2014  -  06/13/2018   Walked on 2lpm x  3 laps @  approx 213ft each @ nl pace  stopped due to  Sob after each lap  s desat     rec as of 10/16/2019  = 2lpm hs and prn   Make sure you check your oxygen saturations at highest level of activity to be sure it stays over 90% and adjust upward to maintain this level if needed but remember to turn it back to previous settings when you stop (to conserve your supply).          Each maintenance medication was reviewed in detail including emphasizing most importantly the difference between maintenance and prns and under what circumstances the prns are to be triggered using an action plan format where appropriate.  Total time for H and P, chart review, counseling, reviewing devices -inhalers and 02 -  and generating customized AVS unique to this office visit / charting = 20 min

## 2019-10-21 NOTE — Progress Notes (Signed)
Oncology Nurse Navigator Documentation  Confirmed patient's attendance at tomorrow morning's [facility].  She understands to arrive at 10:00 for registration, that she will be directed to Radiation Waiting where she will be met prior to the start of [place].  Harlow Asa RN, BSN, OCN Head & Neck Oncology Nurse [place] at [facility] Phone # [phone]  Fax # [phone]

## 2019-10-22 ENCOUNTER — Ambulatory Visit: Payer: Medicare Other | Attending: Otolaryngology

## 2019-10-22 ENCOUNTER — Other Ambulatory Visit: Payer: Self-pay

## 2019-10-22 DIAGNOSIS — R490 Dysphonia: Secondary | ICD-10-CM | POA: Diagnosis present

## 2019-10-22 NOTE — Therapy (Signed)
[facility] [address] [place], [place], [address] Phone: [phone]   Fax:  [phone]  Speech Language Pathology Treatment  Patient Details  Name: Brooke [name] MRN: [id number] Date of Birth: 1945/10/29 Referring Provider (SLP): Helayne Semin[place]., MD   Encounter Date: 10/22/2019   End of Session - 10/22/19 1740    Visit Number 4    Number of Visits 6    Date for SLP Re-Evaluation 01/20/20    SLP Start Time 1025    SLP Stop Time  1102    SLP Time Calculation (min) 37 min    Activity Tolerance Patient tolerated treatment well           Past Medical History:  Diagnosis Date  . Chronic respiratory failure (HCC)    a. on home O2.  Marland Kitchen COPD (chronic obstructive pulmonary disease) ([place])    a. Home O2.  . Former tobacco use   . GERD (gastroesophageal reflux disease)   . Hyperlipidemia   . Liver metastases ([place])   . lung ca 10/2016  . 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 NSTEMI (non-ST elevated myocardial infarction) ([place])    a. 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 Coronary vasospasm    Past Surgical History:  Procedure Laterality Date  . LEFT HEART CATHETERIZATION WITH CORONARY ANGIOGRAM N/A 06/08/2013   Procedure: LEFT HEART CATHETERIZATION WITH CORONARY ANGIOGRAM;  Surgeon: Sanda Klein, MD;  Location: [facility];  Service: Cardiovascular;  Laterality: N/A;    There were no vitals filed for this visit.   Subjective Assessment - 10/22/19 1039    Subjective Pt appears to have slightly more voicing than prior session.    Currently in Pain? No/denies                 ADULT SLP TREATMENT - 10/22/19 1040      General Information   Behavior/Cognition Alert;Cooperative;Pleasant mood      Treatment Provided   Treatment provided Dysphagia      Dysphagia Treatment   Temperature Spikes Noted No    Respiratory Status Nasal  cannula    Oral Cavity - Dentition Adequate natural dentition    Treatment Methods Therapeutic exercise;Skilled observation;Patient/caregiver education    Patient observed directly with PO's Yes    Type of PO's observed Dysphagia 3 (soft);Thin liquids   ham sandwich   Liquids provided via Cup    Oral Phase Signs & Symptoms --   none noted   Pharyngeal Phase Signs & Symptoms --   none noted   Other treatment/comments Pt using pressed and strained/pushing voice until SLP mintioned to pt then she used confidential tone for 80% of remainder of session. Pt ate a package of peanuts - "I'm doing pretty well." She was independent with HEP, and reports success with a wider variety of foods. She agrees that once every other month is appopriate at this time.       Assessment / Recommendations / Plan   Plan --   continue every other month     Dysphagia Recommendations   Diet recommendations Dysphagia 3 (mechanical soft);Thin liquid    Liquids provided via Cup    Medication Administration --   as tolerated     Progression Toward Goals   Progression toward goals Progressing toward goals              SLP Short Term Goals - 09/24/19 1627  SLP SHORT TERM GOAL #1   Title Pt will use "confidential tone" in 5 minutes simple conversation in 2 ST sessions    Period --   sessions, for all STGs   Status Achieved      SLP SHORT TERM GOAL #2   Title pt will complete voice HEP with rare min A    Status Achieved            SLP Long Term Goals - 10/22/19 1742      SLP LONG TERM GOAL #1   Title pt perform HEP with modified independence x3 sessions    Baseline 08-20-19, 09-24-19    Period --   sessions, for all LTGs   Status Achieved      SLP LONG TERM GOAL #2   Title pt will use "confidential voice" in 10 minutes simple conversation in 3 sessinos with SLP    Baseline 09-24-19, 10-22-19    Time 1    Period --   renewed 10-22-19   Status On-going      SLP LONG TERM GOAL #3   Title Over 7  weeks between sessions, pt will perform HEP with modified independence    Time 1    Period --   session   Status New            Plan - 10/22/19 1740    Clinical Impression Statement Pt presents today with 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 Pt's last day RT was 08-25-19. SLP reviewed voice (and swallow) exercise program today with pt, with which she completed with independence. Pt reports coughing with liquids no longer occurs.  SLP believes pt would cont to benefit from skilled ST once approx every eight weeks x1-2 more sessions to ensure pt is performing HEP correctly. Pt may require more frequency skilled ST after her radiation is completed for work coordinating abdominal breathing and speech, as well as a focus on vocal hygiene.    Speech Therapy Frequency --   approx once every 4 weeks   Duration --   7 total sessions   Treatment/Interventions Other (comment);Multimodal communcation approach;SLP instruction and feedback;Patient/family education;Internal/external aids   voice exercises   Potential to Achieve Goals Good    SLP Home Exercise Plan provided today    Consulted and Agree with Plan of Care Patient           Patient will benefit from skilled therapeutic intervention in order to improve the following deficits and impairments:   Hoarseness    Problem List Patient Active Problem List   Diagnosis Date Noted  . 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 Hoarseness 04/07/2019  . Ingrown right big toenail 03/16/2018  . Goals of care, counseling/discussion 12/05/2016  . 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 Protein-calorie malnutrition, severe 11/02/2016  . SOB (shortness of breath)   . Community acquired pneumonia of left upper lobe of lung 10/22/2016  . COPD exacerbation ([place]) 10/22/2016  . Hypokalemia 10/22/2016  . Microcytic anemia 10/22/2016  . Mass of upper lobe of left lung 02/15/2016  . Chronic  respiratory failure with hypoxia ([place]) 02/15/2016  . GERD (gastroesophageal reflux disease)   . Former tobacco use   . HLD (hyperlipidemia) 06/08/2013  . NSTEMI (non-ST elevated myocardial infarction) ([place]) 06/05/2013  . COPD  GOLD IV  06/05/2013    Alayne Estrella ,MS, CCC-SLP  10/22/2019, 5:46 PM  [facility] [address]  [place], [place], [address] Phone: [phone]   Fax:  [phone]   Name: Brooke [name] MRN: [id number] Date of Birth: 05-Dec-1945

## 2019-10-22 NOTE — Progress Notes (Signed)
Oncology Nurse Navigator Documentation  I met with [name] and her caregiver briefly before her appointment with Brooke [name] SLP today. I answered questions to the best of my ability, and she knows to call me with further questions or concerns.   Harlow Asa RN, BSN, OCN Head & Neck Oncology Nurse [place] at [facility] Phone # [phone]  Fax # [phone]

## 2019-11-11 ENCOUNTER — Ambulatory Visit (HOSPITAL_COMMUNITY)
Admission: RE | Admit: 2019-11-11 | Discharge: 2019-11-11 | Disposition: A | Discharge status: Home / Self Care | Payer: Medicare Other | Source: Ambulatory Visit | Attending: Oncology | Admitting: Oncology

## 2019-11-11 ENCOUNTER — Other Ambulatory Visit: Payer: Self-pay

## 2019-11-11 ENCOUNTER — Inpatient Hospital Stay: Payer: Medicare Other | Attending: Oncology

## 2019-11-11 DIAGNOSIS — Z5112 Encounter for antineoplastic immunotherapy: Secondary | ICD-10-CM | POA: Insufficient documentation

## 2019-11-11 DIAGNOSIS — R590 Localized enlarged lymph nodes: Secondary | ICD-10-CM | POA: Insufficient documentation

## 2019-11-11 DIAGNOSIS — R490 Dysphonia: Secondary | ICD-10-CM | POA: Insufficient documentation

## 2019-11-11 DIAGNOSIS — I252 Old myocardial infarction: Secondary | ICD-10-CM | POA: Diagnosis not present

## 2019-11-11 DIAGNOSIS — Z9981 Dependence on supplemental oxygen: Secondary | ICD-10-CM | POA: Diagnosis not present

## 2019-11-11 DIAGNOSIS — C3412 Malignant neoplasm of upper lobe, left bronchus or lung: Secondary | ICD-10-CM

## 2019-11-11 DIAGNOSIS — J440 Chronic obstructive pulmonary disease with acute lower respiratory infection: Secondary | ICD-10-CM | POA: Diagnosis not present

## 2019-11-11 DIAGNOSIS — Z79899 Other long term (current) drug therapy: Secondary | ICD-10-CM | POA: Insufficient documentation

## 2019-11-11 DIAGNOSIS — C7889 Secondary malignant neoplasm of other digestive organs: Secondary | ICD-10-CM | POA: Diagnosis not present

## 2019-11-11 LAB — CBC WITH DIFFERENTIAL (CANCER CENTER ONLY)
Abs Immature Granulocytes: 0.01 10*3/uL (ref 0.00–0.07)
Basophils Absolute: 0 10*3/uL (ref 0.0–0.1)
Basophils Relative: 0 %
Eosinophils Absolute: 0.1 10*3/uL (ref 0.0–0.5)
Eosinophils Relative: 1 %
HCT: 38.5 % (ref 36.0–46.0)
Hemoglobin: 12.1 g/dL (ref 12.0–15.0)
Immature Granulocytes: 0 %
Lymphocytes Relative: 15 %
Lymphs Abs: 1 10*3/uL (ref 0.7–4.0)
MCH: 27.4 pg (ref 26.0–34.0)
MCHC: 31.4 g/dL (ref 30.0–36.0)
MCV: 87.3 fL (ref 80.0–100.0)
Monocytes Absolute: 0.6 10*3/uL (ref 0.1–1.0)
Monocytes Relative: 9 %
Neutro Abs: 4.8 10*3/uL (ref 1.7–7.7)
Neutrophils Relative %: 75 %
Platelet Count: 253 10*3/uL (ref 150–400)
RBC: 4.41 MIL/uL (ref 3.87–5.11)
RDW: 13.7 % (ref 11.5–15.5)
WBC Count: 6.5 10*3/uL (ref 4.0–10.5)
nRBC: 0 % (ref 0.0–0.2)

## 2019-11-11 LAB — CMP (CANCER CENTER ONLY)
ALT: 15 U/L (ref 0–44)
AST: 20 U/L (ref 15–41)
Albumin: 3.2 g/dL — ABNORMAL LOW (ref 3.5–5.0)
Alkaline Phosphatase: 81 U/L (ref 38–126)
Anion gap: 11 (ref 5–15)
BUN: 9 mg/dL (ref 8–23)
CO2: 24 mmol/L (ref 22–32)
Calcium: 10 mg/dL (ref 8.9–10.3)
Chloride: 100 mmol/L (ref 98–111)
Creatinine: 0.59 mg/dL (ref 0.44–1.00)
GFR, Est AFR Am: >60 mL/min (ref >60)
GFR, Estimated: >60 mL/min (ref >60)
Glucose, Bld: 102 mg/dL — ABNORMAL HIGH (ref 70–99)
Potassium: 4.2 mmol/L (ref 3.5–5.1)
Sodium: 135 mmol/L (ref 135–145)
Total Bilirubin: 0.3 mg/dL (ref 0.3–1.2)
Total Protein: 7.4 g/dL (ref 6.5–8.1)

## 2019-11-11 MED ORDER — IOHEXOL 300 MG/ML  SOLN
75.0000 mL | Freq: Once | INTRAMUSCULAR | Status: AC | PRN
  Administered 2019-11-11: 75 mL via INTRAVENOUS

## 2019-11-11 MED ORDER — SODIUM CHLORIDE (PF) 0.9 % IJ SOLN
INTRAMUSCULAR | Status: AC
  Filled 2019-11-11: qty 50

## 2019-11-13 ENCOUNTER — Telehealth: Payer: Self-pay

## 2019-11-13 NOTE — Telephone Encounter (Signed)
TC to pt per Dr Benay Spice to let her know that her lungs and spleen are unchanged, enlarged nodes in abdomen and chest could indicated disease progression, Let her know that they will discuss resuming treatment or referral for a biopsy. Patient asked when her next appointment was and I let her know that she sees [name] NP on 11/17/19 @ 10:15am. Patient verbalized understanding.

## 2019-11-17 ENCOUNTER — Encounter: Payer: Self-pay | Admitting: Nurse Practitioner

## 2019-11-17 ENCOUNTER — Inpatient Hospital Stay (HOSPITAL_BASED_OUTPATIENT_CLINIC_OR_DEPARTMENT_OTHER): Payer: Medicare Other | Admitting: Nurse Practitioner

## 2019-11-17 ENCOUNTER — Other Ambulatory Visit: Payer: Self-pay

## 2019-11-17 ENCOUNTER — Telehealth: Payer: Self-pay | Admitting: Family Medicine

## 2019-11-17 VITALS — BP 149/94 | HR 89 | Temp 97.5°F | Resp 16 | Ht 62.5 in | Wt 92.8 lb

## 2019-11-17 DIAGNOSIS — C3412 Malignant neoplasm of upper lobe, left bronchus or lung: Secondary | ICD-10-CM

## 2019-11-17 NOTE — Telephone Encounter (Signed)
   Brooke [name] DOB: Jul 19, 1945 MRN: [id number]   RIDER WAIVER AND RELEASE OF LIABILITY  For purposes of improving physical access to our facilities, [facility] is pleased to partner with third parties to provide [facility] patients or other authorized individuals the option of convenient, on-demand ground transportation services (the [place]") through use of the technology service that enables users to request on-demand ground transportation from independent third-party providers.  By opting to use and accept these [employer], I, the undersigned, hereby agree on behalf of myself, and on behalf of any minor child using the [employer] for whom I am the parent or legal guardian, as follows:  1. Government social research officer provided to me are provided by independent third-party transportation providers who are not [employer] or employees and who are unaffiliated with [employer]. 2. [facility] is neither a transportation carrier nor a common or public carrier. 3. [facility] has no control over the quality or safety of the transportation that occurs as a result of the [employer]. 4. [facility] cannot guarantee that any third-party transportation provider will complete any arranged transportation service. 5. [facility] makes no representation, warranty, or guarantee regarding the reliability, timeliness, quality, safety, suitability, or availability of any of the Transport Services or that they will be error free. 6. I fully understand that traveling by vehicle involves risks and dangers of serious bodily injury, including permanent disability, paralysis, and death. I agree, on behalf of myself and on behalf of any minor child using the Transport Services for whom I am the parent or legal guardian, that the entire risk arising out of my use of the [employer] remains solely with me, to the maximum extent permitted under applicable law. 7. The [employer] are provided "as is" and "as available." [facility] disclaims all representations and warranties, express, implied or statutory, not expressly set out in these terms, including the implied warranties of merchantability and fitness for a particular purpose. 8. I hereby waive and release [facility], its agents, employees, officers, directors, representatives, insurers, attorneys, assigns, successors, subsidiaries, and affiliates from any and all past, present, or future claims, demands, liabilities, actions, causes of action, or suits of any kind directly or indirectly arising from acceptance and use of the [employer]. 9. I further waive and release [facility] and its affiliates from all present and future liability and responsibility for any injury or death to persons or damages to property caused by or related to the use of the [employer]. 10. I have read this Waiver and Release of Liability, and I understand the terms used in it and their legal significance. This Waiver is freely and voluntarily given with the understanding that my right (as well as the right of any minor child for whom I am the parent or legal guardian using the [employer]) to legal recourse against [facility] in connection with the [employer] is knowingly surrendered in return for use of these services.   I attest that I read the consent document to Brooke [name], gave Ms. Dejarnette the opportunity to ask questions and answered the questions asked (if any). I affirm that Brooke [name] then provided consent for she's participation in this program.     Drucie Ip

## 2019-11-17 NOTE — Progress Notes (Addendum)
[facility] OFFICE PROGRESS NOTE   Diagnosis: Non-small cell lung cancer, head/neck cancer  INTERVAL HISTORY:   Ms. Bai returns as scheduled.  Overall she is feeling well.  Hoarseness varies.  Dyspnea is stable.  She notices the dyspnea worsens when it is "humid".  She has a good appetite.  Objective:  Vital signs in last 24 hours:  Blood pressure (!) 149/94, pulse 89, temperature (!) 97.5 F (36.4 C), temperature source Temporal, resp. rate 16, height 5' 2.5" (1.588 m), weight 92 lb 12.8 oz (42.1 kg), SpO2 99 %.    Lymphatics: No palpable cervical or supraclavicular lymph nodes. Resp: Distant breath sounds.  Lungs are clear. Cardio: Regular rate and rhythm. GI: Abdomen soft and nontender.  No hepatosplenomegaly. Vascular: No leg edema.   Lab Results:  Lab Results  Component Value Date   WBC 6.5 11/11/2019   HGB 12.1 11/11/2019   HCT 38.5 11/11/2019   MCV 87.3 11/11/2019   PLT 253 11/11/2019   NEUTROABS 4.8 11/11/2019    Imaging:  No results found.  Medications: I have reviewed the patient's current medications.  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  PDL1 60%  Left lung radiation 11/08/2016 through 11/21/2016  CT chest 12/12/2016-reexpansion of left upper lobe with a decreased left upper lobe mass, unchanged mediastinal adenopathy, progression of a splenic metastasis/pancreatic tail/gastrohepatic ligament metastasis, right lower lobe pneumonia  Cycle 1 Pembrolizumab 12/14/2016  Cycle 2 Pembrolizumab  01/03/2017  Cycle 3 Pembrolizumab 01/24/2017  Cycle 4 Pembrolizumab 02/12/2017  CT 03/05/2018-decrease in left upper lobe mass, mediastinal adenopathy, and splenic mass  Cycle 5 pembrolizumab 03/07/2017  Cycle 6 Pembrolizumab 04/02/2017  Cycle 7 pembrolizumab 04/25/2017  Cycle 8 Pembrolizumab 05/14/2017  Cycle 9 Pembrolizumab 06/06/2017  CT 06/20/2017-mild decrease in left upper lobe mass, mild increase in paramediastinal left upper lobe nodule-radiation change?, decreased splenic metastasis  Cycle 10 pembrolizumab 06/25/2017  Cycle 11 pembrolizumab 07/18/2017  Cycle 12 pembrolizumab 08/29/2017  Cycle 13 pembrolizumab 09/17/2017  Cycle 14 Pembrolizumab 10/10/2017  CT chest 10/24/2017-slight enlargement of small mediastinal lymph nodes, increased left upper lobe consolidation, decreased size of spleen lesion  Cycle 15 pembrolizumab 10/29/2017  Cycle 16 Pembrolizumab 11/21/2017  Cycle 17 Pembrolizumab 12/11/2017  Cycle 18 pembrolizumab 01/02/2018  Cycle 19 Pembrolizumab 01/21/2018  Cycle 20 Pembrolizumab 02/13/2018  CT chest 02/27/2018-decreased left paratracheal node, progressive consolidation/fibrosis in the left upper lung, decreased size of splenic mass  Cycle 21 pembrolizumab 03/04/2018  Cycle 22 Pembrolizumab 03/27/2018  Cycle 23 pembrolizumab 04/15/2018  Cycle 24 pembrolizumab 05/08/2018  Cycle 25 Pembrolizumab 05/27/2018  CT chest 06/16/2018-stable left upper lung radiation fibrosis with no evidence of local tumor recurrence. Decreased left paratracheal adenopathy. No new or progressive metastatic disease in the chest. Gastrohepatic ligament lymphadenopathy stable. Splenic metastasis decreased. T5 vertebral compression fracture with associated patchy sclerosis and no discrete osseous lesion, new in the interval.  Cycle 26 Pembrolizumab 06/19/2018  Cycle 27 pembrolizumab 07/08/2018  Cycle 28 pembrolizumab 07/31/2018  Cycle 29 pembrolizumab 08/19/2018  Cycle 30  Pembrolizumab 09/11/2018  Cycle 31 pembrolizumab 09/30/2018  Cycle 32 pembrolizumab 10/23/2018  CT chest 10/28/2018-left apical pleural-parenchymal opacity consistent with radiation scarring has become more confluent  likely representing evolutionary change. Continued further decrease in size of index left paratracheal node and splenic metastasis. Gastrohepatic ligament lymph node not changed.  Cycle 33 Pembrolizumab 11/11/2018  Cycle 34 pembrolizumab 12/04/2018  Cycle 35 pembrolizumab 12/23/2018  CT chest 02/12/2019-posttreatment changes left upper lobe unchanged. Continued decrease in size of splenic metastasis. Stable appearance of left paratracheal lymph nodes. Stable gastrohepatic adenopathy. Incomplete visualization of retroperitoneal lymph nodes in the upper abdomen.  CT chest 07/02/2019-stable post treatment related changes left lung.  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  Slight regression of enlarged likely metastatic gastrohepatic lymph node.  CT chest 11/11/2019-interval enlargement of a right paratracheal lymph node measuring 14 mm, increased from 5 mm.  No new lung mass or nodularity.  New adenopathy in the upper abdomen.  2.7 cm lesion gastrohepatic ligament.  Necrotic node at the splenic hilum measures 4.7 cm.  Large node along the IVC left upper quadrant measures 2.7 cm.  Similar node left adjacent the aorta measures 1.8 cm.  Enhancing lesion in the spleen is unchanged.  Several low-density lesions in the liver are unchanged.   2. 05/29/2019 laryngoscopy-exophytic appearing mass mid right vocal cord.    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  Tumor cells negative for p16, CMV and HSV 2.  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   Radiation 07/29/2019-08/25/2019  3. History of acute respiratory failure secondary to #1  4. Oxygen dependent COPD  5. History of a NSTEMI 2015  6. Right lung pneumonia on chest CT  12/12/2016-treated with Levaquin  7.Grade 2 rash possibly related to Pembrolizumab. Treatment held 08/06/2017.Improved 08/29/2017, treatment resumed, rash has resolved  Disposition: Ms. Cadle appears stable.  The recent chest CT shows evidence of progression involving lymph nodes in the chest and upper abdomen.  We reviewed the images.  She understands the high likelihood that this represents progression of the lung cancer.  [name] recommends resuming pembrolizumab on a 3-week schedule.  She agrees with this plan.  She needs to talk to her friend before making a decision on a treatment day. She will call the office back with this information and we will schedule the first cycle.  Patient seen with [name].  [name] ANP/GNP-BC   11/17/2019  10:38 AM  This was a shared visit with [name].  We reviewed the CT findings and images with Ms. Reidel.  There is evidence of progressive disease involving chest and abdominal lymph nodes.  We discussed treatment options.  She had a prolonged response to pembrolizumab at initial presentation.  I recommend resuming pembrolizumab.  We will consider systemic chemotherapy if there is no response after 2-3 cycles.  Julieanne Manson, MD

## 2019-11-19 ENCOUNTER — Telehealth: Payer: Self-pay

## 2019-11-19 NOTE — Telephone Encounter (Signed)
-----   Message from Owens Shark, NP sent at 11/18/2019  4:16 PM EDT ----- Please call her to find out date she wants to begin treatment, thanks

## 2019-11-19 NOTE — Telephone Encounter (Signed)
Called spoke with patient receive a list of dates and times relayed all to scheduling will update with new appts once received

## 2019-11-20 ENCOUNTER — Telehealth: Payer: Self-pay | Admitting: Oncology

## 2019-11-20 NOTE — Telephone Encounter (Signed)
Scheduled appt per 7/15 sch msg - pt is aware of apt date and time

## 2019-11-27 NOTE — Progress Notes (Signed)
Pharmacist Chemotherapy Monitoring - Follow Up Assessment    I verify that I have reviewed each item in the below checklist:  . Regimen for the patient is scheduled for the appropriate day and plan matches scheduled date. Marland Kitchen Appropriate non-routine labs are ordered dependent on drug ordered. . If applicable, additional medications reviewed and ordered per protocol based on lifetime cumulative doses and/or treatment regimen.   Plan for follow-up and/or issues identified: Yes . I-vent associated with next due treatment: No . MD and/or nursing notified:No   Kennith Center, Pharm.D., CPP 11/27/2019@5 :12 PM

## 2019-11-29 ENCOUNTER — Other Ambulatory Visit: Payer: Self-pay | Admitting: Oncology

## 2019-12-03 ENCOUNTER — Other Ambulatory Visit: Payer: Self-pay

## 2019-12-03 ENCOUNTER — Inpatient Hospital Stay (HOSPITAL_BASED_OUTPATIENT_CLINIC_OR_DEPARTMENT_OTHER): Payer: Medicare Other | Admitting: Oncology

## 2019-12-03 ENCOUNTER — Inpatient Hospital Stay: Payer: Medicare Other

## 2019-12-03 VITALS — BP 137/94 | HR 92 | Temp 97.7°F | Resp 18 | Ht 62.2 in | Wt 92.6 lb

## 2019-12-03 DIAGNOSIS — C3412 Malignant neoplasm of upper lobe, left bronchus or lung: Secondary | ICD-10-CM

## 2019-12-03 MED ORDER — SODIUM CHLORIDE 0.9 % IV SOLN
200.0000 mg | Freq: Once | INTRAVENOUS | Status: AC
  Administered 2019-12-03: 200 mg via INTRAVENOUS
  Filled 2019-12-03: qty 8

## 2019-12-03 MED ORDER — SODIUM CHLORIDE 0.9 % IV SOLN
Freq: Once | INTRAVENOUS | Status: AC
  Filled 2019-12-03: qty 250

## 2019-12-03 NOTE — Patient Instructions (Signed)
[facility] Discharge Instructions for Patients Receiving Chemotherapy  Today you received the following chemotherapy agents keytruda  To help prevent nausea and vomiting after your treatment, we encourage you to take your nausea medication .   If you develop nausea and vomiting that is not controlled by your nausea medication, call the clinic.   BELOW ARE SYMPTOMS THAT SHOULD BE REPORTED IMMEDIATELY:  *FEVER GREATER THAN 100.5 F  *CHILLS WITH OR WITHOUT FEVER  NAUSEA AND VOMITING THAT IS NOT CONTROLLED WITH YOUR NAUSEA MEDICATION  *UNUSUAL SHORTNESS OF BREATH  *UNUSUAL BRUISING OR BLEEDING  TENDERNESS IN MOUTH AND THROAT WITH OR WITHOUT PRESENCE OF ULCERS  *URINARY PROBLEMS  *BOWEL PROBLEMS  UNUSUAL RASH Items with * indicate a potential emergency and should be followed up as soon as possible.  Feel free to call the clinic should you have any questions or concerns. The clinic phone number is [phone].  Please show the [facility] at check-in to the Emergency Department and triage nurse.

## 2019-12-03 NOTE — Progress Notes (Signed)
Per [name]: NO labs are required to treat today.

## 2019-12-03 NOTE — Progress Notes (Signed)
[facility] OFFICE PROGRESS NOTE   Diagnosis: Non-small cell lung cancer  INTERVAL HISTORY:   Brooke [name] returns as scheduled.  She reports an improved energy level.  Good appetite.  No new complaint.  Objective:  Vital signs in last 24 hours:  Blood pressure (!) 137/94, pulse 92, temperature 97.7 F (36.5 C), temperature source Oral, resp. rate 18, height 5' 2.2" (1.58 m), weight (!) 92 lb 9.6 oz (42 kg), SpO2 100 %.     Resp: Bronchial sounds at the left posterior chest, distant breath sounds, end inspiratory rhonchi of the right posterior base, no respiratory distress Cardio: Regular rate and rhythm GI: No hepatosplenomegaly, firm fullness in the lateral left mid abdomen, nontender Vascular: No leg edema    Lab Results:  Lab Results  Component Value Date   WBC 6.5 11/11/2019   HGB 12.1 11/11/2019   HCT 38.5 11/11/2019   MCV 87.3 11/11/2019   PLT 253 11/11/2019   NEUTROABS 4.8 11/11/2019    CMP  Lab Results  Component Value Date   NA 135 11/11/2019   K 4.2 11/11/2019   CL 100 11/11/2019   CO2 24 11/11/2019   GLUCOSE 102 (H) 11/11/2019   BUN 9 11/11/2019   CREATININE 0.59 11/11/2019   CALCIUM 10.0 11/11/2019   PROT 7.4 11/11/2019   ALBUMIN 3.2 (L) 11/11/2019   AST 20 11/11/2019   ALT 15 11/11/2019   ALKPHOS 81 11/11/2019   BILITOT 0.3 11/11/2019   GFRNONAA >60 11/11/2019   GFRAA >60 11/11/2019     Medications: I have reviewed the patient's current medications.   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  PDL1  60%  Left lung radiation 11/08/2016 through 11/21/2016  CT chest 12/12/2016-reexpansion of left upper lobe with a decreased left upper lobe mass, unchanged mediastinal adenopathy, progression of a splenic metastasis/pancreatic tail/gastrohepatic ligament metastasis, right lower lobe pneumonia  Cycle 1 Pembrolizumab 12/14/2016  Cycle 2 Pembrolizumab 01/03/2017  Cycle 3 Pembrolizumab 01/24/2017  Cycle 4 Pembrolizumab 02/12/2017  CT 03/05/2018-decrease in left upper lobe mass, mediastinal adenopathy, and splenic mass  Cycle 5 pembrolizumab 03/07/2017  Cycle 6 Pembrolizumab 04/02/2017  Cycle 7 pembrolizumab 04/25/2017  Cycle 8 Pembrolizumab 05/14/2017  Cycle 9 Pembrolizumab 06/06/2017  CT 06/20/2017-mild decrease in left upper lobe mass, mild increase in paramediastinal left upper lobe nodule-radiation change?, decreased splenic metastasis  Cycle 10 pembrolizumab 06/25/2017  Cycle 11 pembrolizumab 07/18/2017  Cycle 12 pembrolizumab 08/29/2017  Cycle 13 pembrolizumab 09/17/2017  Cycle 14 Pembrolizumab 10/10/2017  CT chest 10/24/2017-slight enlargement of small mediastinal lymph nodes, increased left upper lobe consolidation, decreased size of spleen lesion  Cycle 15 pembrolizumab 10/29/2017  Cycle 16 Pembrolizumab 11/21/2017  Cycle 17 Pembrolizumab 12/11/2017  Cycle 18 pembrolizumab 01/02/2018  Cycle 19 Pembrolizumab 01/21/2018  Cycle 20 Pembrolizumab 02/13/2018  CT chest 02/27/2018-decreased left paratracheal node, progressive consolidation/fibrosis in the left upper lung, decreased size of splenic mass  Cycle 21 pembrolizumab 03/04/2018  Cycle 22 Pembrolizumab 03/27/2018  Cycle 23 pembrolizumab 04/15/2018  Cycle 24 pembrolizumab 05/08/2018  Cycle 25 Pembrolizumab 05/27/2018  CT chest 06/16/2018-stable left upper lung radiation fibrosis with no evidence of local tumor recurrence. Decreased left paratracheal adenopathy.  No new or progressive metastatic disease in the  chest. Gastrohepatic ligament lymphadenopathy stable. Splenic metastasis decreased. T5 vertebral compression fracture with associated patchy sclerosis and no discrete osseous lesion, new in the interval.  Cycle 26 Pembrolizumab 06/19/2018  Cycle 27 pembrolizumab 07/08/2018  Cycle 28 pembrolizumab 07/31/2018  Cycle 29 pembrolizumab 08/19/2018  Cycle 30 Pembrolizumab 09/11/2018  Cycle 31 pembrolizumab 09/30/2018  Cycle 32 pembrolizumab 10/23/2018  CT chest 10/28/2018-left apical pleural-parenchymal opacity consistent with radiation scarring has become more confluent likely representing evolutionary change. Continued further decrease in size of index left paratracheal node and splenic metastasis. Gastrohepatic ligament lymph node not changed.  Cycle 33 Pembrolizumab 11/11/2018  Cycle 34 pembrolizumab 12/04/2018  Cycle 35 pembrolizumab 12/23/2018  CT chest 02/12/2019-posttreatment changes left upper lobe unchanged. Continued decrease in size of splenic metastasis. Stable appearance of left paratracheal lymph nodes. Stable gastrohepatic adenopathy. Incomplete visualization of retroperitoneal lymph nodes in the upper abdomen.  CT chest 07/02/2019-stable post treatment related changes left lung.  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  Slight regression of enlarged likely metastatic gastrohepatic lymph node.  CT chest 11/11/2019-interval enlargement of a right paratracheal lymph node measuring 14 mm, increased from 5 mm.  No new lung mass or nodularity.  New adenopathy in the upper abdomen.  2.7 cm lesion gastrohepatic ligament.  Necrotic node at the splenic hilum measures 4.7 cm.  Large node along the IVC left upper quadrant measures 2.7 cm.  Similar node left adjacent the aorta measures 1.8 cm.  Enhancing lesion in the spleen is unchanged.  Several low-density lesions in the liver are unchanged.  Pembrolizumab resumed on a 3-week schedule 12/03/2019   2. 05/29/2019 laryngoscopy-exophytic  appearing mass mid right vocal cord.    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  Tumor cells negative for p16, CMV and HSV 2.  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   Radiation 07/29/2019-08/25/2019  3. History of acute respiratory failure secondary to #1  4. Oxygen dependent COPD  5. History of a NSTEMI 2015  6. Right lung pneumonia on chest CT 12/12/2016-treated with Levaquin  7.Grade 2 rash possibly related to Pembrolizumab. Treatment held 08/06/2017.Improved 08/29/2017, treatment resumed, rash has resolved    Disposition: Ms. Symmonds appears stable.  She will resume treatment with pembrolizumab today.  We reviewed potential toxicities associated with pembrolizumab.  She agrees to proceed.  She will return for an office visit and the next cycle of pembrolizumab in 3 weeks.  We will schedule a restaging chest CT after 3 treatments with pembrolizumab.  Brooke Coder, MD  12/03/2019  8:52 AM

## 2019-12-04 ENCOUNTER — Telehealth: Payer: Self-pay | Admitting: Oncology

## 2019-12-04 NOTE — Telephone Encounter (Signed)
Scheduled appts per 7/29 los. Pt requested to push appts back to Friday because of her oxygen delivery. Pt confirmed appt dates and times.

## 2019-12-20 ENCOUNTER — Other Ambulatory Visit: Payer: Self-pay | Admitting: Oncology

## 2019-12-21 ENCOUNTER — Ambulatory Visit: Payer: Medicare Other | Attending: Otolaryngology

## 2019-12-21 ENCOUNTER — Other Ambulatory Visit: Payer: Self-pay

## 2019-12-21 DIAGNOSIS — R490 Dysphonia: Secondary | ICD-10-CM | POA: Diagnosis present

## 2019-12-21 NOTE — Therapy (Signed)
[facility] [address] [place], [place], [address] Phone: [phone]   Fax:  [phone]  Speech Language Pathology Treatment/Discharge Summary  Patient Details  Name: Brooke [name] MRN: [id number] Date of Birth: 11/17/1945 Referring Provider (SLP): Marcellino, Amanda J., MD   Encounter Date: 12/21/2019   End of Session - 12/21/19 1102    Visit Number 5    Number of Visits 6    Date for SLP Re-Evaluation 01/20/20    SLP Start Time 1020    SLP Stop Time  1052    SLP Time Calculation (min) 32 min    Activity Tolerance Patient tolerated treatment well           Past Medical History:  Diagnosis Date  . Chronic respiratory failure (HCC)    a. on home O2.  . COPD (chronic obstructive pulmonary disease) (HCC)    a. Home O2.  . Former tobacco use   . GERD (gastroesophageal reflux disease)   . Hyperlipidemia   . Liver metastases (HCC)   . lung ca 10/2016  . 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 NSTEMI (non-ST elevated myocardial infarction) (HCC)    a. 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 Coronary vasospasm    Past Surgical History:  Procedure Laterality Date  . LEFT HEART CATHETERIZATION WITH CORONARY ANGIOGRAM N/A 06/08/2013   Procedure: LEFT HEART CATHETERIZATION WITH CORONARY ANGIOGRAM;  Surgeon: Mihai Croitoru, MD;  Location: [facility] CATH LAB;  Service: Cardiovascular;  Laterality: N/A;    There were no vitals filed for this visit.   Subjective Assessment - 12/21/19 1019    Subjective SLP notes likely progression of lung cancer doc in pt's chart. Pt cont on chemo. Pt with mod hoarseness - better voice than previous session.    Currently in Pain? No/denies                 ADULT SLP TREATMENT - 12/21/19 1022      General Information   Behavior/Cognition Alert;Cooperative;Pleasant mood      Treatment Provided   Treatment provided  Dysphagia      Dysphagia Treatment   Temperature Spikes Noted No    Respiratory Status Nasal cannula    Oral Cavity - Dentition Adequate natural dentition    Treatment Methods Skilled observation;Therapeutic exercise;Patient/caregiver education    Patient observed directly with PO's Yes    Type of PO's observed Dysphagia 3 (soft);Thin liquids    Liquids provided via Cup    Oral Phase Signs & Symptoms --   none noted   Pharyngeal Phase Signs & Symptoms --   none noted   Other treatment/comments Pt again using pressed/strained voice. SLP reminded pt of confidential voice so as to not encourage vocal hyperfunction/MTD. Pt noted to use non-pressed/strained voice for remainder of session. Independent with HEP today. See "pt instructions". Pt agred with d/c today. SLP told pt when to decr frequency of HEP, and she told SLP independently 15 minutes later.       Assessment / Recommendations / Plan   Plan Continue with current plan of care;Discharge SLP treatment due to (comment)      Dysphagia Recommendations   Diet recommendations Regular;Dysphagia 3 (mechanical soft);Thin liquid    Medication Administration --   as tolerated           SLP Education - 12/21/19 1101    Education Details do not speak with straining/pushing, when to decr   frequency of HEP    Person(s) Educated Patient;Caregiver(s)    Methods Explanation    Comprehension Verbalized understanding;Returned demonstration            SLP Short Term Goals - 09/24/19 1627      SLP SHORT TERM GOAL #1   Title Pt will use "confidential tone" in 5 minutes simple conversation in 2 ST sessions    Period --   sessions, for all STGs   Status Achieved      SLP SHORT TERM GOAL #2   Title pt will complete voice HEP with rare min A    Status Achieved            SLP Long Term Goals - 12/21/19 1104      SLP LONG TERM GOAL #1   Title pt perform HEP with modified independence x3 sessions    Baseline 08-20-19, 09-24-19    Period --    sessions, for all LTGs   Status Achieved      SLP LONG TERM GOAL #2   Title pt will use "confidential voice" in 10 minutes simple conversation in 3 sessinos with SLP    Baseline 09-24-19, 10-22-19    Status Achieved      SLP LONG TERM GOAL #3   Title Over 7 weeks between sessions, pt will perform HEP with modified independence    Status Achieved            Plan - 12/21/19 1102    Clinical Impression Statement Pt presents today with 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 Pt's last day RT was 08-25-19. SLP reviewed pt's exercise program today with pt, with which she completed with independence. No overt s/sx aspiration with POs today. SLP cued pt to speak in non-strained voice/"confidential tone" and pt did so for remainder of session (25 minutes). Pt agrees with d/c today.    Treatment/Interventions Other (comment);Multimodal communcation approach;SLP instruction and feedback;Patient/family education;Internal/external aids   voice exercises   Potential to Achieve Goals Good    SLP Home Exercise Plan provided today    Consulted and Agree with Plan of Care Patient           Patient will benefit from skilled therapeutic intervention in order to improve the following deficits and impairments:   Hoarseness   SPEECH THERAPY DISCHARGE SUMMARY  Visits from Start of Care: 5  Current functional level related to goals / functional outcomes: See pt's goal summary, above.    Remaining deficits: Mod hoarseness.   Education / Equipment: HEP, voice hygiene   Plan: Patient agrees to discharge.  Patient goals were met. Patient is being discharged due to meeting the stated rehab goals.  ?????       Problem List Patient Active Problem List   Diagnosis Date Noted  . 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 Hoarseness 04/07/2019  . Ingrown right big toenail 03/16/2018  . Goals of care, counseling/discussion 12/05/2016  . 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 Protein-calorie malnutrition, severe 11/02/2016  . SOB (shortness of breath)   . Community acquired pneumonia of left upper lobe of lung 10/22/2016  . COPD exacerbation ([place]) 10/22/2016  . Hypokalemia 10/22/2016  . Microcytic anemia 10/22/2016  . Mass of upper lobe of left lung 02/15/2016  . Chronic respiratory failure with hypoxia ([place]) 02/15/2016  . GERD (gastroesophageal reflux disease)   . Former tobacco use   . HLD (hyperlipidemia) 06/08/2013  .  NSTEMI (non-ST elevated myocardial infarction) (HCC) 06/05/2013  . COPD  GOLD IV  06/05/2013    SCHINKE,CARL ,MS, CCC-SLP  12/21/2019, 11:04 AM  [facility] [address] [place], [place], [address] Phone: [phone]   Fax:  [phone]   Name: Brooke [name] MRN: [id number] Date of Birth: 10/27/1945 

## 2019-12-21 NOTE — Patient Instructions (Signed)
   Check on your follow up with Dr. Blenda Nicely - I'm fairly sure Dr. Isidore Moos would encourage this as well.  If you should have increased frequency of coughing or choking during mealtimes contact your primary care MD or Dr. Blenda Nicely.  Keep doing the exercises each day until February 24, 2020 and after that you may decrease frequency to x2-3/week.

## 2019-12-24 ENCOUNTER — Other Ambulatory Visit: Payer: Medicare Other

## 2019-12-24 ENCOUNTER — Ambulatory Visit: Payer: Medicare Other

## 2019-12-24 ENCOUNTER — Ambulatory Visit: Payer: Medicare Other | Admitting: Oncology

## 2019-12-25 ENCOUNTER — Inpatient Hospital Stay: Payer: Medicare Other | Attending: Oncology

## 2019-12-25 ENCOUNTER — Inpatient Hospital Stay (HOSPITAL_BASED_OUTPATIENT_CLINIC_OR_DEPARTMENT_OTHER): Payer: Medicare Other | Admitting: Oncology

## 2019-12-25 ENCOUNTER — Telehealth: Payer: Self-pay | Admitting: Oncology

## 2019-12-25 ENCOUNTER — Other Ambulatory Visit: Payer: Self-pay

## 2019-12-25 ENCOUNTER — Inpatient Hospital Stay: Payer: Medicare Other

## 2019-12-25 VITALS — BP 128/94 | HR 81 | Temp 98.6°F | Resp 17 | Ht 62.2 in | Wt 92.3 lb

## 2019-12-25 DIAGNOSIS — C3412 Malignant neoplasm of upper lobe, left bronchus or lung: Secondary | ICD-10-CM

## 2019-12-25 DIAGNOSIS — Z9981 Dependence on supplemental oxygen: Secondary | ICD-10-CM | POA: Insufficient documentation

## 2019-12-25 DIAGNOSIS — R590 Localized enlarged lymph nodes: Secondary | ICD-10-CM | POA: Diagnosis not present

## 2019-12-25 DIAGNOSIS — J440 Chronic obstructive pulmonary disease with acute lower respiratory infection: Secondary | ICD-10-CM | POA: Insufficient documentation

## 2019-12-25 DIAGNOSIS — I252 Old myocardial infarction: Secondary | ICD-10-CM | POA: Insufficient documentation

## 2019-12-25 DIAGNOSIS — Z5112 Encounter for antineoplastic immunotherapy: Secondary | ICD-10-CM | POA: Insufficient documentation

## 2019-12-25 DIAGNOSIS — C7889 Secondary malignant neoplasm of other digestive organs: Secondary | ICD-10-CM | POA: Diagnosis not present

## 2019-12-25 DIAGNOSIS — Z79899 Other long term (current) drug therapy: Secondary | ICD-10-CM | POA: Insufficient documentation

## 2019-12-25 DIAGNOSIS — R21 Rash and other nonspecific skin eruption: Secondary | ICD-10-CM | POA: Insufficient documentation

## 2019-12-25 LAB — CMP (CANCER CENTER ONLY)
ALT: 12 U/L (ref 0–44)
AST: 17 U/L (ref 15–41)
Albumin: 3.5 g/dL (ref 3.5–5.0)
Alkaline Phosphatase: 78 U/L (ref 38–126)
Anion gap: 9 (ref 5–15)
BUN: 12 mg/dL (ref 8–23)
CO2: 25 mmol/L (ref 22–32)
Calcium: 10 mg/dL (ref 8.9–10.3)
Chloride: 103 mmol/L (ref 98–111)
Creatinine: 0.6 mg/dL (ref 0.44–1.00)
GFR, Est AFR Am: >60 mL/min (ref >60)
GFR, Estimated: >60 mL/min (ref >60)
Glucose, Bld: 92 mg/dL (ref 70–99)
Potassium: 4.4 mmol/L (ref 3.5–5.1)
Sodium: 137 mmol/L (ref 135–145)
Total Bilirubin: 0.3 mg/dL (ref 0.3–1.2)
Total Protein: 7.4 g/dL (ref 6.5–8.1)

## 2019-12-25 LAB — TSH: TSH: 1.347 u[IU]/mL (ref 0.308–3.960)

## 2019-12-25 MED ORDER — SODIUM CHLORIDE 0.9 % IV SOLN
200.0000 mg | Freq: Once | INTRAVENOUS | Status: AC
  Administered 2019-12-25: 200 mg via INTRAVENOUS
  Filled 2019-12-25: qty 8

## 2019-12-25 MED ORDER — SODIUM CHLORIDE 0.9 % IV SOLN
Freq: Once | INTRAVENOUS | Status: AC
  Filled 2019-12-25: qty 250

## 2019-12-25 NOTE — Progress Notes (Signed)
Per [name]: CBC is not needed to treat today

## 2019-12-25 NOTE — Patient Instructions (Signed)
[facility] Discharge Instructions for Patients Receiving Chemotherapy  Today you received the following chemotherapy agents keytruda  To help prevent nausea and vomiting after your treatment, we encourage you to take your nausea medication .   If you develop nausea and vomiting that is not controlled by your nausea medication, call the clinic.   BELOW ARE SYMPTOMS THAT SHOULD BE REPORTED IMMEDIATELY:  *FEVER GREATER THAN 100.5 F  *CHILLS WITH OR WITHOUT FEVER  NAUSEA AND VOMITING THAT IS NOT CONTROLLED WITH YOUR NAUSEA MEDICATION  *UNUSUAL SHORTNESS OF BREATH  *UNUSUAL BRUISING OR BLEEDING  TENDERNESS IN MOUTH AND THROAT WITH OR WITHOUT PRESENCE OF ULCERS  *URINARY PROBLEMS  *BOWEL PROBLEMS  UNUSUAL RASH Items with * indicate a potential emergency and should be followed up as soon as possible.  Feel free to call the clinic should you have any questions or concerns. The clinic phone number is [phone].  Please show the [place] at check-in to the Emergency Department and triage nurse.

## 2019-12-25 NOTE — Telephone Encounter (Signed)
Scheduled per 8/20 los. Printed avs and calendar for pt.

## 2019-12-25 NOTE — Progress Notes (Signed)
[facility] [place] OFFICE PROGRESS NOTE   Diagnosis: Non-small cell lung cancer  INTERVAL HISTORY:   Ms. Moustafa completed a treatment with pembrolizumab on 12/03/2019.  No rash or diarrhea.  She continues to feel "weak ".  She reports stable dyspnea.  No new complaint.  Hoarseness has improved.  Objective:  Vital signs in last 24 hours:  Blood pressure (!) 128/94, pulse 81, temperature 98.6 F (37 C), temperature source Tympanic, resp. rate 17, height 5' 2.2" (1.58 m), weight 92 lb 4.8 oz (41.9 kg), SpO2 99 %.   Resp: Distant breath sounds throughout the right chest, bronchial sounds in the left chest, no respiratory distress Cardio: Regular rate and rhythm GI: No hepatosplenomegaly, nontender Vascular: No leg edema   Skin: No rash    Lab Results:  Lab Results  Component Value Date   WBC 6.5 11/11/2019   HGB 12.1 11/11/2019   HCT 38.5 11/11/2019   MCV 87.3 11/11/2019   PLT 253 11/11/2019   NEUTROABS 4.8 11/11/2019    CMP  Lab Results  Component Value Date   NA 137 12/25/2019   K 4.4 12/25/2019   CL 103 12/25/2019   CO2 25 12/25/2019   GLUCOSE 92 12/25/2019   BUN 12 12/25/2019   CREATININE 0.60 12/25/2019   CALCIUM 10.0 12/25/2019   PROT 7.4 12/25/2019   ALBUMIN 3.5 12/25/2019   AST 17 12/25/2019   ALT 12 12/25/2019   ALKPHOS 78 12/25/2019   BILITOT 0.3 12/25/2019   GFRNONAA >60 12/25/2019   GFRAA >60 12/25/2019     Medications: I have reviewed the patient's current medications.   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  PDL1 60%  Left lung radiation 11/08/2016 through 11/21/2016  CT chest 12/12/2016-reexpansion of left upper lobe with a decreased left upper lobe mass, unchanged mediastinal adenopathy, progression of a splenic metastasis/pancreatic tail/gastrohepatic ligament metastasis, right lower lobe pneumonia  Cycle 1 Pembrolizumab 12/14/2016  Cycle 2 Pembrolizumab 01/03/2017  Cycle 3 Pembrolizumab 01/24/2017  Cycle 4 Pembrolizumab 02/12/2017  CT 03/05/2018-decrease in left upper lobe mass, mediastinal adenopathy, and splenic mass  Cycle 5 pembrolizumab 03/07/2017  Cycle 6 Pembrolizumab 04/02/2017  Cycle 7 pembrolizumab 04/25/2017  Cycle 8 Pembrolizumab 05/14/2017  Cycle 9 Pembrolizumab 06/06/2017  CT 06/20/2017-mild decrease in left upper lobe mass, mild increase in paramediastinal left upper lobe nodule-radiation change?, decreased splenic metastasis  Cycle 10 pembrolizumab 06/25/2017  Cycle 11 pembrolizumab 07/18/2017  Cycle 12 pembrolizumab 08/29/2017  Cycle 13 pembrolizumab 09/17/2017  Cycle 14 Pembrolizumab 10/10/2017  CT chest 10/24/2017-slight enlargement of small mediastinal lymph nodes, increased left upper lobe consolidation, decreased size of spleen lesion  Cycle 15 pembrolizumab 10/29/2017  Cycle 16 Pembrolizumab 11/21/2017  Cycle 17 Pembrolizumab 12/11/2017  Cycle 18 pembrolizumab 01/02/2018  Cycle 19 Pembrolizumab 01/21/2018  Cycle 20 Pembrolizumab 02/13/2018  CT chest 02/27/2018-decreased left paratracheal node, progressive consolidation/fibrosis in the left upper lung, decreased size of splenic mass  Cycle 21 pembrolizumab 03/04/2018  Cycle 22 Pembrolizumab 03/27/2018  Cycle 23 pembrolizumab 04/15/2018  Cycle 24 pembrolizumab 05/08/2018  Cycle 25 Pembrolizumab 05/27/2018  CT chest 06/16/2018-stable left upper lung radiation fibrosis with no evidence of local tumor recurrence. Decreased left  paratracheal adenopathy. No new or progressive metastatic  disease in the chest. Gastrohepatic ligament lymphadenopathy stable. Splenic metastasis decreased. T5 vertebral compression fracture with associated patchy sclerosis and no discrete osseous lesion, new in the interval.  Cycle 26 Pembrolizumab 06/19/2018  Cycle 27 pembrolizumab 07/08/2018  Cycle 28 pembrolizumab 07/31/2018  Cycle 29 pembrolizumab 08/19/2018  Cycle 30 Pembrolizumab 09/11/2018  Cycle 31 pembrolizumab 09/30/2018  Cycle 32 pembrolizumab 10/23/2018  CT chest 10/28/2018-left apical pleural-parenchymal opacity consistent with radiation scarring has become more confluent likely representing evolutionary change. Continued further decrease in size of index left paratracheal node and splenic metastasis. Gastrohepatic ligament lymph node not changed.  Cycle 33 Pembrolizumab 11/11/2018  Cycle 34 pembrolizumab 12/04/2018  Cycle 35 pembrolizumab 12/23/2018  CT chest 02/12/2019-posttreatment changes left upper lobe unchanged. Continued decrease in size of splenic metastasis. Stable appearance of left paratracheal lymph nodes. Stable gastrohepatic adenopathy. Incomplete visualization of retroperitoneal lymph nodes in the upper abdomen.  CT chest 07/02/2019-stable post treatment related changes left lung.  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  Slight regression of enlarged likely metastatic gastrohepatic lymph node.  CT chest 11/11/2019-interval enlargement of a right paratracheal lymph node measuring 14 mm, increased from 5 mm.  No new lung mass or nodularity.  New adenopathy in the upper abdomen.  2.7 cm lesion gastrohepatic ligament.  Necrotic node at the splenic hilum measures 4.7 cm.  Large node along the IVC left upper quadrant measures 2.7 cm.  Similar node left adjacent the aorta measures 1.8 cm.  Enhancing lesion in the spleen is unchanged.  Several low-density lesions in the liver are unchanged.  Pembrolizumab resumed on a 3-week schedule 12/03/2019   2. 05/29/2019  laryngoscopy-exophytic appearing mass mid right vocal cord.    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  Tumor cells negative for p16, CMV and HSV 2.  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   Radiation 07/29/2019-08/25/2019  3. History of acute respiratory failure secondary to #1  4. Oxygen dependent COPD  5. History of a NSTEMI 2015  6. Right lung pneumonia on chest CT 12/12/2016-treated with Levaquin  7.Grade 2 rash possibly related to Pembrolizumab. Treatment held 08/06/2017.Improved 08/29/2017, treatment resumed, rash has resolved      Disposition: Brooke [name] appears stable.  She tolerated the first cycle of salvage pembrolizumab therapy well.  She will complete cycle 2 today.  She will return for an office visit and pembrolizumab in 3 weeks. I encouraged her to obtain the COVID-19 booster vaccine.  She did not wish to receive the vaccine today.  Betsy Coder, MD  12/25/2019  9:57 AM

## 2020-01-14 ENCOUNTER — Other Ambulatory Visit: Payer: Self-pay | Admitting: *Deleted

## 2020-01-14 ENCOUNTER — Ambulatory Visit: Payer: Medicare Other | Admitting: Nurse Practitioner

## 2020-01-14 ENCOUNTER — Ambulatory Visit: Payer: Medicare Other

## 2020-01-14 ENCOUNTER — Other Ambulatory Visit: Payer: Medicare Other

## 2020-01-14 DIAGNOSIS — C3412 Malignant neoplasm of upper lobe, left bronchus or lung: Secondary | ICD-10-CM

## 2020-01-15 ENCOUNTER — Other Ambulatory Visit: Payer: Self-pay

## 2020-01-15 ENCOUNTER — Telehealth: Payer: Self-pay | Admitting: Oncology

## 2020-01-15 ENCOUNTER — Inpatient Hospital Stay: Payer: Medicare Other | Attending: Oncology

## 2020-01-15 ENCOUNTER — Inpatient Hospital Stay: Payer: Medicare Other

## 2020-01-15 ENCOUNTER — Inpatient Hospital Stay (HOSPITAL_BASED_OUTPATIENT_CLINIC_OR_DEPARTMENT_OTHER): Payer: Medicare Other | Admitting: Oncology

## 2020-01-15 VITALS — BP 176/95 | HR 74 | Temp 97.0°F | Resp 18 | Ht 62.2 in | Wt 93.2 lb

## 2020-01-15 VITALS — BP 178/93

## 2020-01-15 DIAGNOSIS — Z79899 Other long term (current) drug therapy: Secondary | ICD-10-CM | POA: Insufficient documentation

## 2020-01-15 DIAGNOSIS — R21 Rash and other nonspecific skin eruption: Secondary | ICD-10-CM | POA: Diagnosis not present

## 2020-01-15 DIAGNOSIS — R590 Localized enlarged lymph nodes: Secondary | ICD-10-CM | POA: Diagnosis not present

## 2020-01-15 DIAGNOSIS — Z9981 Dependence on supplemental oxygen: Secondary | ICD-10-CM | POA: Insufficient documentation

## 2020-01-15 DIAGNOSIS — Z23 Encounter for immunization: Secondary | ICD-10-CM

## 2020-01-15 DIAGNOSIS — C7889 Secondary malignant neoplasm of other digestive organs: Secondary | ICD-10-CM | POA: Diagnosis not present

## 2020-01-15 DIAGNOSIS — Z5112 Encounter for antineoplastic immunotherapy: Secondary | ICD-10-CM | POA: Diagnosis present

## 2020-01-15 DIAGNOSIS — R0600 Dyspnea, unspecified: Secondary | ICD-10-CM | POA: Insufficient documentation

## 2020-01-15 DIAGNOSIS — C3412 Malignant neoplasm of upper lobe, left bronchus or lung: Secondary | ICD-10-CM

## 2020-01-15 DIAGNOSIS — J440 Chronic obstructive pulmonary disease with acute lower respiratory infection: Secondary | ICD-10-CM | POA: Diagnosis not present

## 2020-01-15 DIAGNOSIS — I252 Old myocardial infarction: Secondary | ICD-10-CM | POA: Diagnosis not present

## 2020-01-15 DIAGNOSIS — T451X5A Adverse effect of antineoplastic and immunosuppressive drugs, initial encounter: Secondary | ICD-10-CM | POA: Diagnosis not present

## 2020-01-15 DIAGNOSIS — F410 Panic disorder [episodic paroxysmal anxiety] without agoraphobia: Secondary | ICD-10-CM | POA: Diagnosis not present

## 2020-01-15 LAB — CMP (CANCER CENTER ONLY)
ALT: 13 U/L (ref 0–44)
AST: 17 U/L (ref 15–41)
Albumin: 3.8 g/dL (ref 3.5–5.0)
Alkaline Phosphatase: 58 U/L (ref 38–126)
Anion gap: 8 (ref 5–15)
BUN: 14 mg/dL (ref 8–23)
CO2: 25 mmol/L (ref 22–32)
Calcium: 9.3 mg/dL (ref 8.9–10.3)
Chloride: 105 mmol/L (ref 98–111)
Creatinine: 0.72 mg/dL (ref 0.44–1.00)
GFR, Est AFR Am: >60 mL/min (ref >60)
GFR, Estimated: >60 mL/min (ref >60)
Glucose, Bld: 92 mg/dL (ref 70–99)
Potassium: 4.4 mmol/L (ref 3.5–5.1)
Sodium: 138 mmol/L (ref 135–145)
Total Bilirubin: 0.4 mg/dL (ref 0.3–1.2)
Total Protein: 7.1 g/dL (ref 6.5–8.1)

## 2020-01-15 LAB — CBC WITH DIFFERENTIAL (CANCER CENTER ONLY)
Abs Immature Granulocytes: 0.01 10*3/uL (ref 0.00–0.07)
Basophils Absolute: 0 10*3/uL (ref 0.0–0.1)
Basophils Relative: 1 %
Eosinophils Absolute: 0.1 10*3/uL (ref 0.0–0.5)
Eosinophils Relative: 3 %
HCT: 40.9 % (ref 36.0–46.0)
Hemoglobin: 12.8 g/dL (ref 12.0–15.0)
Immature Granulocytes: 0 %
Lymphocytes Relative: 23 %
Lymphs Abs: 0.9 10*3/uL (ref 0.7–4.0)
MCH: 26.7 pg (ref 26.0–34.0)
MCHC: 31.3 g/dL (ref 30.0–36.0)
MCV: 85.2 fL (ref 80.0–100.0)
Monocytes Absolute: 0.3 10*3/uL (ref 0.1–1.0)
Monocytes Relative: 7 %
Neutro Abs: 2.8 10*3/uL (ref 1.7–7.7)
Neutrophils Relative %: 66 %
Platelet Count: 175 10*3/uL (ref 150–400)
RBC: 4.8 MIL/uL (ref 3.87–5.11)
RDW: 16.8 % — ABNORMAL HIGH (ref 11.5–15.5)
WBC Count: 4.1 10*3/uL (ref 4.0–10.5)
nRBC: 0 % (ref 0.0–0.2)

## 2020-01-15 MED ORDER — SODIUM CHLORIDE 0.9 % IV SOLN
200.0000 mg | Freq: Once | INTRAVENOUS | Status: AC
  Administered 2020-01-15: 200 mg via INTRAVENOUS
  Filled 2020-01-15: qty 8

## 2020-01-15 MED ORDER — SODIUM CHLORIDE 0.9 % IV SOLN
Freq: Once | INTRAVENOUS | Status: AC
  Filled 2020-01-15: qty 250

## 2020-01-15 NOTE — Telephone Encounter (Signed)
Scheduled per los. Gave avs and calendar  

## 2020-01-15 NOTE — Progress Notes (Signed)
Per [name]: OK to treat today with hypertension. Will recheck at rest in infusion area

## 2020-01-15 NOTE — Progress Notes (Signed)
[facility] OFFICE PROGRESS NOTE   Diagnosis: Non-small cell lung cancer  INTERVAL HISTORY:   Ms. Castner completed another treatment with pembrolizumab on 12/25/2019.  No rash or diarrhea.  She has intermittent episodes of dyspnea and "panic "attacks.  The panic attacks improved with Xanax.  Her oxygen saturations remain adequate.  She has a good appetite.  Objective:  Vital signs in last 24 hours:  Blood pressure (!) 176/95, pulse 74, temperature (!) 97 F (36.1 C), temperature source Axillary, resp. rate 18, height 5' 2.2" (1.58 m), weight 93 lb 3.2 oz (42.3 kg), SpO2 100 %.    Resp: Bronchial sounds at the left posterior chest, no respiratory distress Cardio: Regular rate and rhythm GI: No hepatosplenomegaly, nontender Vascular: No leg edema  Skin: No rash    Lab Results:  Lab Results  Component Value Date   WBC 4.1 01/15/2020   HGB 12.8 01/15/2020   HCT 40.9 01/15/2020   MCV 85.2 01/15/2020   PLT 175 01/15/2020   NEUTROABS 2.8 01/15/2020    CMP  Lab Results  Component Value Date   NA 138 01/15/2020   K 4.4 01/15/2020   CL 105 01/15/2020   CO2 25 01/15/2020   GLUCOSE 92 01/15/2020   BUN 14 01/15/2020   CREATININE 0.72 01/15/2020   CALCIUM 9.3 01/15/2020   PROT 7.1 01/15/2020   ALBUMIN 3.8 01/15/2020   AST 17 01/15/2020   ALT 13 01/15/2020   ALKPHOS 58 01/15/2020   BILITOT 0.4 01/15/2020   GFRNONAA >60 01/15/2020   GFRAA >60 01/15/2020     Medications: I have reviewed the patient's current medications.   Assessment/Plan: 1. Left lung mass  PET scan 02/28/2016-hypermetabolic left upper lobe mass, hypermetabolic adjacent nodule, hypermetabolic AP window node  CT chest 10/28/2016-enlarging left upper lobe mass, increased AP window lymphadenopathy, new spleen metastasis, new adenopathy at the pancreas tail, upper abdomen, and middle mediastinum  CT-guided biopsy of the left lung mass on 11/01/2016, Non-small cell carcinoma most  consistent with squamous cell carcinoma  PDL1 60%  Left lung radiation 11/08/2016 through 11/21/2016  CT chest 12/12/2016-reexpansion of left upper lobe with a decreased left upper lobe mass, unchanged mediastinal adenopathy, progression of a splenic metastasis/pancreatic tail/gastrohepatic ligament metastasis, right lower lobe pneumonia  Cycle 1 Pembrolizumab 12/14/2016  Cycle 2 Pembrolizumab 01/03/2017  Cycle 3 Pembrolizumab 01/24/2017  Cycle 4 Pembrolizumab 02/12/2017  CT 03/05/2018-decrease in left upper lobe mass, mediastinal adenopathy, and splenic mass  Cycle 5 pembrolizumab 03/07/2017  Cycle 6 Pembrolizumab 04/02/2017  Cycle 7 pembrolizumab 04/25/2017  Cycle 8 Pembrolizumab 05/14/2017  Cycle 9 Pembrolizumab 06/06/2017  CT 06/20/2017-mild decrease in left upper lobe mass, mild increase in paramediastinal left upper lobe nodule-radiation change?, decreased splenic metastasis  Cycle 10 pembrolizumab 06/25/2017  Cycle 11 pembrolizumab 07/18/2017  Cycle 12 pembrolizumab 08/29/2017  Cycle 13 pembrolizumab 09/17/2017  Cycle 14 Pembrolizumab 10/10/2017  CT chest 10/24/2017-slight enlargement of small mediastinal lymph nodes, increased left upper lobe consolidation, decreased size of spleen lesion  Cycle 15 pembrolizumab 10/29/2017  Cycle 16 Pembrolizumab 11/21/2017  Cycle 17 Pembrolizumab 12/11/2017  Cycle 18 pembrolizumab 01/02/2018  Cycle 19 Pembrolizumab 01/21/2018  Cycle 20 Pembrolizumab 02/13/2018  CT chest 02/27/2018-decreased left paratracheal node, progressive consolidation/fibrosis in the left upper lung, decreased size of splenic mass  Cycle 21 pembrolizumab 03/04/2018  Cycle 22 Pembrolizumab 03/27/2018  Cycle 23 pembrolizumab 04/15/2018  Cycle 24 pembrolizumab 05/08/2018  Cycle 25 Pembrolizumab 05/27/2018  CT chest 06/16/2018-stable left upper lung radiation fibrosis with no evidence of  local tumor recurrence. Decreased left paratracheal adenopathy. No  new or progressive metastatic disease in the chest. Gastrohepatic ligament lymphadenopathy stable. Splenic metastasis decreased. T5 vertebral compression fracture with associated patchy sclerosis and no discrete osseous lesion, new in the interval.  Cycle 26 Pembrolizumab 06/19/2018  Cycle 27 pembrolizumab 07/08/2018  Cycle 28 pembrolizumab 07/31/2018  Cycle 29 pembrolizumab 08/19/2018  Cycle 30 Pembrolizumab 09/11/2018  Cycle 31 pembrolizumab 09/30/2018  Cycle 32 pembrolizumab 10/23/2018  CT chest 10/28/2018-left apical pleural-parenchymal opacity consistent with radiation scarring has become more confluent likely representing evolutionary change. Continued further decrease in size of index left paratracheal node and splenic metastasis. Gastrohepatic ligament lymph node not changed.  Cycle 33 Pembrolizumab 11/11/2018  Cycle 34 pembrolizumab 12/04/2018  Cycle 35 pembrolizumab 12/23/2018  CT chest 02/12/2019-posttreatment changes left upper lobe unchanged. Continued decrease in size of splenic metastasis. Stable appearance of left paratracheal lymph nodes. Stable gastrohepatic adenopathy. Incomplete visualization of retroperitoneal lymph nodes in the upper abdomen.  CT chest 07/02/2019-stable post treatment related changes left lung.  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  Slight regression of enlarged likely metastatic gastrohepatic lymph node.  CT chest 11/11/2019-interval enlargement of a right paratracheal lymph node measuring 14 mm, increased from 5 mm.  No new lung mass or nodularity.  New adenopathy in the upper abdomen.  2.7 cm lesion gastrohepatic ligament.  Necrotic node at the splenic hilum measures 4.7 cm.  Large node along the IVC left upper quadrant measures 2.7 cm.  Similar node left adjacent the aorta measures 1.8 cm.  Enhancing lesion in the spleen is unchanged.  Several low-density lesions in the liver are unchanged.  Pembrolizumab resumed on a 3-week schedule  12/03/2019   2. 05/29/2019 laryngoscopy-exophytic appearing mass mid right vocal cord.    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  Tumor cells negative for p16, CMV and HSV 2.  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   Radiation 07/29/2019-08/25/2019  3. History of acute respiratory failure secondary to #1  4. Oxygen dependent COPD  5. History of a NSTEMI 2015  6. Right lung pneumonia on chest CT 12/12/2016-treated with Levaquin  7.Grade 2 rash possibly related to Pembrolizumab. Treatment held 08/06/2017.Improved 08/29/2017, treatment resumed, rash has resolved  Disposition: Ms. Gal appears unchanged.  She will complete a third treatment of salvage pembrolizumab today.  She will undergo a restaging CT after cycle 4.  Ms. Broberg will receive the COVID-19 booster vaccine today.  She will return for an office visit and pembrolizumab in 3 weeks.  Betsy Coder, MD  01/15/2020  11:01 AM

## 2020-01-15 NOTE — Patient Instructions (Signed)
[facility] Discharge Instructions for Patients Receiving Chemotherapy  Today you received the following chemotherapy agents:  Keytruda.  To help prevent nausea and vomiting after your treatment, we encourage you to take your nausea medication as directed.   If you develop nausea and vomiting that is not controlled by your nausea medication, call the clinic.   BELOW ARE SYMPTOMS THAT SHOULD BE REPORTED IMMEDIATELY:  *FEVER GREATER THAN 100.5 F  *CHILLS WITH OR WITHOUT FEVER  NAUSEA AND VOMITING THAT IS NOT CONTROLLED WITH YOUR NAUSEA MEDICATION  *UNUSUAL SHORTNESS OF BREATH  *UNUSUAL BRUISING OR BLEEDING  TENDERNESS IN MOUTH AND THROAT WITH OR WITHOUT PRESENCE OF ULCERS  *URINARY PROBLEMS  *BOWEL PROBLEMS  UNUSUAL RASH Items with * indicate a potential emergency and should be followed up as soon as possible.  Feel free to call the clinic should you have any questions or concerns. The clinic phone number is [phone].  Please show the CHEMO ALERT CARD at check-in to the Emergency Department and triage nurse.    

## 2020-01-15 NOTE — Progress Notes (Signed)
   Covid-19 Vaccination Clinic  Name:  Brooke [name]    MRN: [id number] DOB: February 25, 1946  01/15/2020  Brooke [name] was observed post Covid-19 immunization for 30 minutes based on pre-vaccination screening without incident. She was provided with Vaccine Information Sheet and instruction to access the V-Safe system.   Brooke [name] was instructed to call 911 with any severe reactions post vaccine: Marland Kitchen Difficulty breathing  . Swelling of face and throat  . A fast heartbeat  . A bad rash all over body  . Dizziness and weakness

## 2020-01-18 ENCOUNTER — Ambulatory Visit (INDEPENDENT_AMBULATORY_CARE_PROVIDER_SITE_OTHER): Payer: Medicare Other | Admitting: Internal Medicine

## 2020-01-18 ENCOUNTER — Other Ambulatory Visit: Payer: Self-pay

## 2020-01-18 ENCOUNTER — Encounter: Payer: Self-pay | Admitting: Internal Medicine

## 2020-01-18 DIAGNOSIS — J449 Chronic obstructive pulmonary disease, unspecified: Secondary | ICD-10-CM | POA: Diagnosis not present

## 2020-01-18 DIAGNOSIS — J9611 Chronic respiratory failure with hypoxia: Secondary | ICD-10-CM

## 2020-01-18 NOTE — Patient Instructions (Signed)
No change in symbicort as your maintenance    Only use your albuterol (either the puffer or the nebulizer) as a rescue medication to be used if you can't catch your breath by resting or doing a relaxed purse lip breathing pattern.  - The less you use it, the better it will work when you need it. - Ok to use up to every 4 hours if you must but call for immediate appointment if use goes up over your usual need   Please schedule a follow up visit in 6 months but call sooner if needed

## 2020-01-18 NOTE — Progress Notes (Signed)
Subjective:    Patient ID: Brooke [name], female   DOB: [birth year],    MRN: [id number]     Brief patient profile:  74 yowf quit smoking 2009 on 02 noct 2012 maint on symbicort 160 since aorund  2014 referred to pulmonary clinic 02/15/2016 by Dr   Kathryne Eriksson for RUL Lung mass dx with Stage IV nsc 11/01/16 in setting of GOLD IV copd      History of Present Illness  02/15/2016 1st [facility] office visit/ Isella Slatten  GOLD IV copd/ symb 160 2bid maint  Chief Complaint  Patient presents with  . Pulmonary Consult    referred by Dr. Kathryne Eriksson for COPD.  MMRC2 = can't walk a nl pace on a flat grade s sob but does fine slow and flat eg [employer] on 2lpm  New onset bloody mucus plugs November 11 2015 assoc with wt loss  02 2lpm hs and with walking / rare saba needed rec For cough > mucinex up to 1200 mg every 12 hours as needed Rec:  Fob but refused   11/01/16   CT directed bx >>>  Pos NSC LUL    Diagnosis:   Stage IV NSCLC, Squamous cell carcinoma of the left upper lobe.     Indication for treatment:  palliative       Radiation treatment dates:   11/08/2016 to 11/21/2016  Site/dose:   The Left lung was treated to 30 Gy in 10 fractions at 3 Gy per fraction.   Beams/energy:   3D // 10X, 6X    12/06/2016  f/u ov/Ahley Bulls re: post hosp f/u -  Now on dulera 200 bid and duoneb Chief Complaint  Patient presents with  . HFU    Breathing is doing well. She is using Duoneb 2 x daily and uses albuterol inhaler 1-2 x per wk on average.   presently in SNF ever since last admit 6/18-29/18 for  Active Problems:   COPD  GOLD IV    Mass of upper lobe of left lung   Chronic respiratory failure with hypoxia (HCC)   COPD exacerbation (HCC)   Hypokalemia   Microcytic anemia   SOB (shortness of breath)   Protein-calorie malnutrition, severe Doe = 2lpm x 177ft with rolling walking  Sleeping ok at < 30 degrees Doe = 2lpm x 1105ft with rolling walker/2lpm Sleeping ok at < 30 degrees   rec 02 can be adjusted down and off for saturations above 90%       12/09/2017  f/u ov/Kortlyn Koltz re: GOLD IV copd/  No 02 at rest/ 2lpm sleep and with activity  Chief Complaint  Patient presents with  . Follow-up    Breathing is overall doing well. She has occ cough with white sputum- relates to PND.  She uses her albuterol inhaler 2 x daily on average. She states she uses neb before she knows she is going somewhere.   Dyspnea:  MMRC2 = can't walk a nl pace on a flat grade s sob but does fine slow and flat eg still pushing cart  Cough: min pnd  SABA use: as above  rec Continue  symbicort and add stiolto 2 pffs each am - - if you don't like it for any reason resume the symbicort alone as per your med calendar  Please schedule a follow up visit in 3 months but call sooner if needed  - add:  Pt notified above typo should have said add spiriva x 2 pffs each am  and continue symbicort Take 2 puffs first thing in am and then another 2 puffs about 12 hours later.   - 12/12/2017 informed could not tol spiriva due to dry mouth   03/14/2018  f/u ov/Lisha Vitale re:  GOLD IV / 02 is 2lpm  Hs and 2lpm exertion/   RA sitting / just on symbicort 160 2bid  Chief Complaint  Patient presents with  . Follow-up    increased cough with white sputum. She states her breathing has been doing well. She is using her albuterol inhaler 2 x per wk and neb maybe 2 x  per wk on average.    Dyspnea:  [employer] ok pushing cart on 2lpm  Cough: more since 2 weeks  Sleeping: flat bed/ 2-3 pillow  SABA use: avg as above 02: as above   rec Work on inhaler technique:   Ok to take augmentin unless it makes you nauseated  Please schedule a follow up visit in 3 months but call sooner if needed      06/13/2018  f/u ov/Demtrius Rounds re: GOLD IV / 02 hs and prn  Chief Complaint  Patient presents with  . Shortness of Breath    Worse since the last visit.  Dyspnea:  Still doing [employer]  On 2lpm but not checking sats as rec with activity   Cough: dry worse day than noct Sleeping: falt bed / couple of pillows SABA use: helps some / uses once a day esp in am 02: 2lpm hs and prn  rec Plan A = Automatic = symbicort 160 Take 2 puffs first thing in am and then another 2 puffs about 12 hours later.  Work on inhaler technique: Plan B = Backup Only use your albuterol inhaler as a rescue medication Plan C = Crisis - only use your albuterol/ipatropium nebulizer if you first try Plan B and it fails to help > ok to use the nebulizer up to every 4 hours but if start needing it regularly call for immediate appointment Please schedule a follow up visit in 3 months but call sooner if needed  - add chart review does not show alpha one screen done    03/05/2019 acute ext ov/Briele Lagasse re:  GOLD IV / 02 dep  Chief Complaint  Patient presents with  . Acute Visit    Increased cough and SOB for the past 2 wks. She has already been treated with zpack. She is using her albuterol inhaler several x per day and neb a couple times per day.   Dyspnea:  Getting worse x months but esp x 2 weeks  Cough: yellow mucus x 2 weeks and no change p zpak  Sleeping: bed is flat, sev pillows SABA use: some better  02: 2lpm 24/7  rec Depomedrol 120 mg IM today  Levaquin 500 mg daily x 7 days  Please remember to go to the  x-ray department  for your tests - we will call you with the results when they are available     11/30/220 televisit, new hoarse rec  gerd rx > ENT F/u > sq cell ca vc    Last RT April 20th 2021 in [place]    09/15/2019  f/u ov/Darrall Strey re: worse sob and hoarseness p RT  Chief Complaint  Patient presents with  . Follow-up    Pt c/o increased SOB and coughing up frothy sputum x 3 wks- relates to RT.    Dyspnea:  Across the room x 3 weeks Cough: more frothy  Sleeping: flat bed 3 pillows  SABA use: hfa and duoneb  02: 2lpm hs and walking and not at rest  Eating ok / no choking on  Food but feels something stuck in throat making  swallowing difficult rec Depomedrol 120 mg IM today  Levaquin 500 mg daily x 7 days  Try prilosec otc 20mg   Take 30-60 min before first meal of the day and Pepcid ac (famotidine) 20 mg one @  bedtime until cough is completely gone for at least a week without the need for cough suppression GERD Keep your previous appt   / see ENT asap    10/16/2019  f/u ov/Gardenia Witter re: GOLD IV copd/ 02 dep / has not seen ent  Chief Complaint  Patient presents with  . Follow-up    COPD, no new issues  Dyspnea:  60 ft x 10 laps s  stopping on 2lpm and says sats stay in 90s   Cough: better  Sleeping: bed is flat/ 3 pillows  SABA use: occ  02: 2lpm 24/7  L cp comes and goes positional no pleuritic or ex related  rec 2lpm bedtime but goal is > 90% saturation 24/7 so adjust during the day  Follow up with ENT as soon as you can    01/18/2020  f/u ov/Maat Kafer re:  GOLD IV / 02 dep  plans to see marcellino sept 27 p completing RT for VC cancer and now on Ketruda for dz progression  Chief Complaint  Patient presents with  . Follow-up    reports episode of vertigo about 2 weeks ago that has improved since onset   Dyspnea:  Still doing 60 ft circuits but slowed down due to vertigo with sats typically ok on 2lpm  Cough: sometimes related to pnds Sleeping: bed is flat, 3 pillows  SABA use:   02: 2lpm 24/7     No obvious day to day or daytime variability or assoc excess/ purulent sputum or mucus plugs or hemoptysis or cp or chest tightness, subjective wheeze or overt sinus or hb symptoms.   Sleeping as above  without nocturnal  or early am exacerbation  of respiratory  c/o's or need for noct saba. Also denies any obvious fluctuation of symptoms with weather or environmental changes or other aggravating or alleviating factors except as outlined above   No unusual exposure hx or h/o childhood pna/ asthma or knowledge of premature birth.  Current Allergies, Complete Past Medical History, Past Surgical History, Family  History, and Social History were reviewed in [facility] record.  ROS  The following are not active complaints unless bolded Hoarseness, sore throat, dysphagia, dental problems, itching, sneezing,  nasal congestion or discharge of excess mucus or purulent secretions, ear ache,   fever, chills, sweats, unintended wt loss or wt gain, classically pleuritic or exertional cp,  orthopnea pnd or arm/hand swelling  or leg swelling, presyncope, palpitations, abdominal pain, anorexia, nausea, vomiting, diarrhea  or change in bowel habits or change in bladder habits, change in stools or change in urine, dysuria, hematuria,  rash, arthralgias, visual complaints, headache, numbness, weakness or ataxia or problems with walking or coordination,  change in mood or  memory.        Current Meds  Medication Sig  . acetaminophen (TYLENOL) 325 MG tablet Per bottle as needed  . albuterol (PROVENTIL HFA;VENTOLIN HFA) 108 (90 BASE) MCG/ACT inhaler Inhale 2 puffs into the lungs every 4 (four) hours as needed for wheezing or shortness of breath. **PLAN  B**  . ALPRAZolam (XANAX) 0.25 MG tablet 1/2-1 twice daily as needed  . atorvastatin (LIPITOR) 20 MG tablet TAKE 1 TABLET(20 MG) BY MOUTH DAILY AT 6 PM  . budesonide-formoterol (SYMBICORT) 160-4.5 MCG/ACT inhaler INHALE 2 PUFFS INTO THE LUNGS TWICE DAILY  . docusate sodium (COLACE) 100 MG capsule Take 100-200 mg by mouth daily.   Marland Kitchen guaiFENesin (MUCINEX) 600 MG 12 hr tablet Take 600 mg by mouth 2 (two) times daily as needed for cough or to loosen phlegm.   Marland Kitchen ibuprofen (ADVIL,MOTRIN) 600 MG tablet Take 600 mg by mouth every 6 (six) hours as needed.  Marland Kitchen ipratropium-albuterol (DUONEB) 0.5-2.5 (3) MG/3ML SOLN Take 3 mLs by nebulization every 4 (four) hours as needed (wheezing/shortness of breath). **PLAN C**  . nitroGLYCERIN (NITROSTAT) 0.4 MG SL tablet ONE TABLET UNDER TONGUE AS NEEDED FOR CHEST PAIN EVERY 5 MINUTES FOR 3 DOSES  . NON FORMULARY [facility]  Onychomycosis Nail Lacquer -  Fluconazole 2%, Terbinafine 1% DMSO/undecylenic acid 25% Apply to affected nail once daily Qty. 120 gm 3 refills   . OXYGEN 2lpm 24/7  . Probiotic Product (PROBIOTIC ADVANCED PO) Take 1 tablet by mouth daily.  . Simethicone (GAS RELIEF) 180 MG CAPS Use as needed                  Objective:   Physical Exam     hoarse chronically ill amb  thin wf  nad   01/18/2020          92 10/16/2019          93  03/05/2019      99  06/13/2018         123  03/14/2018       127  12/09/2017         125   06/10/2017         119  01/08/2017         112   12/06/2016        110   02/15/16 119 lb (54 kg)  08/18/15 137 lb 12.8 oz (62.5 kg)  02/17/15 138 lb 1.9 oz (62.7 kg)    Vital signs reviewed  01/18/2020  - Note at rest 02 sats   98% on RA off sev minutes      HEENT : pt wearing mask not removed for exam due to covid -19 concerns.    NECK :  without JVD/Nodes/TM/ nl carotid upstrokes bilaterally   LUNGS: no acc muscle use,  Mod barrel  contour chest wall with bilateral  Distant bs s audible wheeze and  without cough on insp or exp maneuvers and mod  Hyperresonant  to  percussion bilaterally     CV:  RRR  no s3 or murmur or increase in P2, and no edema   ABD:  soft and nontender with pos mid insp Hoover's  in the supine position. No bruits or organomegaly appreciated, bowel sounds nl  MS:     ext warm without deformities, calf tenderness, cyanosis or clubbing No obvious joint restrictions   SKIN: warm and dry without lesions    NEURO:  alert, approp, nl sensorium with  no motor or cerebellar deficits apparent.               I personally reviewed images and agree with radiology impression as follows:   Chest CT with contrast  7/721  1. Evidence of progressive metastatic lymphadenopathy. 2. Enlarged RIGHT lower paratracheal lymph node. 3. Multiple enlarged  necrotic lymph nodes in the  upper abdomen. 4. Stable post radiation and postsurgical  change in the LEFT upper hemithorax.   Assessment:

## 2020-01-20 ENCOUNTER — Encounter: Payer: Self-pay | Admitting: Internal Medicine

## 2020-01-20 NOTE — Assessment & Plan Note (Signed)
rx 2lpm hs and prn daytime since 2014  -  06/13/2018   Walked on 2lpm x  3 laps @  approx 283ft each @ nl pace  stopped due to  Sob after each lap  s desat     rec as of 01/18/2020  = 2lpm hs and prn with activity with goal of keeping sats > 90%         Each maintenance medication was reviewed in detail including emphasizing most importantly the difference between maintenance and prns and under what circumstances the prns are to be triggered using an action plan format where appropriate.  Total time for H and P, chart review, counseling, teaching device and generating customized AVS unique to this office visit / charting = 23 min

## 2020-01-20 NOTE — Assessment & Plan Note (Signed)
Quit smoking 2009 Spirometry 02/15/2016  FEV1 0.60 (28%)  Ratio 38 with classic curvature  p am symbicort 160  - 01/08/2017    continue dulera 200 2bid  - 12/09/2017  After extensive coaching inhaler device  effectiveness =    90% with smi >add spiriva to  symbicort  - 12/12/2017 informed could not tol spiriva due to dry mouth - - 06/13/2018   continue symbicort 160 - 01/18/2020  After extensive coaching inhaler device,  effectiveness =  90%     Group D in terms of symptom/risk and laba/lama/ICS  therefore appropriate rx at this point >>>  But can't tolerate lama so rec continue symbicort 160 2bid   F/u q 6 m , sooner prn

## 2020-01-22 ENCOUNTER — Ambulatory Visit (INDEPENDENT_AMBULATORY_CARE_PROVIDER_SITE_OTHER): Payer: Medicare Other | Admitting: Physician Assistant

## 2020-01-22 ENCOUNTER — Encounter: Payer: Self-pay | Admitting: Physician Assistant

## 2020-01-22 ENCOUNTER — Other Ambulatory Visit: Payer: Self-pay

## 2020-01-22 VITALS — BP 143/83 | HR 71 | Ht 62.0 in | Wt 94.0 lb

## 2020-01-22 DIAGNOSIS — C3490 Malignant neoplasm of unspecified part of unspecified bronchus or lung: Secondary | ICD-10-CM

## 2020-01-22 DIAGNOSIS — J449 Chronic obstructive pulmonary disease, unspecified: Secondary | ICD-10-CM

## 2020-01-22 DIAGNOSIS — R002 Palpitations: Secondary | ICD-10-CM

## 2020-01-22 DIAGNOSIS — I2510 Atherosclerotic heart disease of native coronary artery without angina pectoris: Secondary | ICD-10-CM

## 2020-01-22 DIAGNOSIS — E785 Hyperlipidemia, unspecified: Secondary | ICD-10-CM

## 2020-01-22 NOTE — Patient Instructions (Signed)
Medication Instructions:  Your physician recommends that you continue on your current medications as directed. Please refer to the Current Medication list given to you today.  *If you need a refill on your cardiac medications before your next appointment, please call your pharmacy*  Lab Work: NONE ordered at this time of appointment   If you have labs (blood work) drawn today and your tests are completely normal, you will receive your results only by: Marland Kitchen MyChart Message (if you have MyChart) OR . A paper copy in the mail If you have any lab test that is abnormal or we need to change your treatment, we will call you to review the results.  Testing/Procedures: NONE ordered at this time of appointment   Follow-Up: At [facility], you and your health needs are our priority.  As part of our continuing mission to provide you with exceptional heart care, we have created designated Provider Care Teams.  These Care Teams include your primary Cardiologist (physician) and Advanced Practice Providers (APPs -  Physician Assistants and Nurse Practitioners) who all work together to provide you with the care you need, when you need it.  We recommend signing up for the patient portal called "MyChart".  Sign up information is provided on this After Visit Summary.  MyChart is used to connect with patients for Virtual Visits (Telemedicine).  Patients are able to view lab/test results, encounter notes, upcoming appointments, etc.  Non-urgent messages can be sent to your provider as well.   To learn more about what you can do with MyChart, go to [email or web address].    Your next appointment:   3 month(s)  The format for your next appointment:   In Person  Provider:   [name], PA-C  Other Instructions

## 2020-01-22 NOTE — Progress Notes (Signed)
Cardiology Office Note:    Date:  01/24/2020   ID:  Brooke [name], DOB [birth year], MRN [id number]  PCP:  [name], MD  [facility] Cardiologist:  Skeet Latch, MD  [place] Electrophysiologist:  None   Referring MD: [name], MD   Chief Complaint  Patient presents with  . Follow-up    seen for Dr. Oval Linsey    History of Present Illness:    Brooke [name] is a 74 y.o. female with a hx of COPD on home O2, minimal CAD on cath 06/2013, GERD, hyperlipidemia, stage IV lung cancerand former tobacco use. 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 It was possible that she had coronary vasospasm. The event leading up to the cardiac cath was that she lost her car keys while shopping and got into a panic episode. She has a history of leg cramps if she take Lipitor on a daily basis, therefore she take Lipitor every other day instead. She is on amlodipine for possible coronary spasm.  She was diagnosed with left upper lobe lung mass, PET scan in October 2017 showed hypermetabolic left upper lobe mass.  She was referred for bronchoscopy in November 2017, however she declined treatment at the time.  CT of the chest in June 2018 showed enlarging left upper lobe mass with new splenic metastasis, new adenopathy at the pancreatic tail, upper abdomen and mid mediastinum. 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  She has completed 35 cycles of chemotherapy with pembrolizumab.  Last cycle of chemotherapy was in August 2020.  Unfortunately, CT scan obtained in October 2020 showed no significant  change in the left upper lobe mass, however there was decreased size in the splenic metastasis.  I last saw her virtually in September 2020, she was having some palpitation however symptom was improving with Gas-X.  I recommended continue to observe the symptom, if palpitation occurs more frequently move may consider heart monitor.  She had a repeat CT of the chest in July 2021 that showed interval enlargement of right paratracheal lymph node, however no new lung mass.  Due to the enlarged lymph node, she has been restarted on the chemotherapy.  Patient presents today for 1 year follow-up.  She denies any recent chest pain, heart palpitation is not very frequent either.  She is concerned about her recent elevation of blood pressure since she has been restarted on the chemotherapy.  She says her dietitian has also instructed her to eat more in order to have her gain some weight, therefore she has no been eating heart healthy diet.  Today's blood pressure has settled down to the 140s range, I recommend a repeat blood pressure check in 3 months, if still elevated, may add back to low-dose amlodipine 2.5 mg daily.  Otherwise she is stable from cardiac perspective.  Past Medical History:  Diagnosis Date  . Chronic respiratory failure (HCC)    a. on home O2.  Marland Kitchen COPD (chronic obstructive pulmonary disease) ([place])    a. Home O2.  . Former tobacco use   . GERD (gastroesophageal reflux disease)   . Hyperlipidemia   .  Liver metastases ([place])   . lung ca 10/2016  . 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 NSTEMI (non-ST elevated myocardial infarction) ([place])    a. 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 Coronary vasospasm    Past Surgical History:  Procedure Laterality Date  . LEFT HEART CATHETERIZATION WITH CORONARY ANGIOGRAM N/A 06/08/2013   Procedure: LEFT HEART CATHETERIZATION WITH CORONARY ANGIOGRAM;  Surgeon: Sanda Klein, MD;  Location: [facility];   Service: Cardiovascular;  Laterality: N/A;    Current Medications: Current Meds  Medication Sig  . acetaminophen (TYLENOL) 325 MG tablet Per bottle as needed  . albuterol (PROVENTIL HFA;VENTOLIN HFA) 108 (90 BASE) MCG/ACT inhaler Inhale 2 puffs into the lungs every 4 (four) hours as needed for wheezing or shortness of breath. **PLAN B**  . ALPRAZolam (XANAX) 0.25 MG tablet 1/2-1 twice daily as needed  . atorvastatin (LIPITOR) 20 MG tablet TAKE 1 TABLET(20 MG) BY MOUTH DAILY AT 6 PM  . budesonide-formoterol (SYMBICORT) 160-4.5 MCG/ACT inhaler INHALE 2 PUFFS INTO THE LUNGS TWICE DAILY  . docusate sodium (COLACE) 100 MG capsule Take 100-200 mg by mouth daily.   Marland Kitchen guaiFENesin (MUCINEX) 600 MG 12 hr tablet Take 600 mg by mouth 2 (two) times daily as needed for cough or to loosen phlegm.   Marland Kitchen ibuprofen (ADVIL,MOTRIN) 600 MG tablet Take 600 mg by mouth every 6 (six) hours as needed.  Marland Kitchen ipratropium-albuterol (DUONEB) 0.5-2.5 (3) MG/3ML SOLN Take 3 mLs by nebulization every 4 (four) hours as needed (wheezing/shortness of breath). **PLAN C**  . nitroGLYCERIN (NITROSTAT) 0.4 MG SL tablet ONE TABLET UNDER TONGUE AS NEEDED FOR CHEST PAIN EVERY 5 MINUTES FOR 3 DOSES  . NON FORMULARY [facility]  Onychomycosis Nail Lacquer -  Fluconazole 2%, Terbinafine 1% DMSO/undecylenic acid 25% Apply to affected nail once daily Qty. 120 gm 3 refills   . OXYGEN 2lpm 24/7  . Probiotic Product (PROBIOTIC ADVANCED PO) Take 1 tablet by mouth daily.  . Simethicone (GAS RELIEF) 180 MG CAPS Use as needed     Allergies:   Codeine, Imdur [isosorbide dinitrate], Prednisone, and Pulmicort [budesonide]   Social History   Socioeconomic History  . Marital status: Divorced    Spouse name: Not on file  . Number of children: Not on file  . Years of education: Not on file  . Highest education level: Not on file  Occupational History  . Not on file  Tobacco Use  . Smoking status: Former Smoker    Packs/day: 1.00     Years: 30.00    Pack years: 30.00    Types: Cigarettes    Quit date: 05/08/2007    Years since quitting: 12.7  . Smokeless tobacco: Never Used  Vaping Use  . Vaping Use: Never used  Substance and Sexual Activity  . Alcohol use: Yes    Comment: rarely  . Drug use: No  . Sexual activity: Not on file  Other Topics Concern  . Not on file  Social History Narrative  . Not on file   Social Determinants of Health   Financial Resource Strain:   . Difficulty of Paying Living Expenses: Not on file  Food Insecurity:   . Worried About Charity fundraiser in the Last Year: Not on file  . Ran Out of Food in the Last Year: Not on file  Transportation Needs:   . Lack of Transportation (Medical): Not on file  . Lack of Transportation (Non-Medical):  Not on file  Physical Activity:   . Days of Exercise per Week: Not on file  . Minutes of Exercise per Session: Not on file  Stress:   . Feeling of Stress : Not on file  Social Connections:   . Frequency of Communication with Friends and Family: Not on file  . Frequency of Social Gatherings with Friends and Family: Not on file  . Attends Religious Services: Not on file  . Active Member of Clubs or Organizations: Not on file  . Attends Archivist Meetings: Not on file  . Marital Status: Not on file     Family History: The patient's family history includes Lung cancer in her father. There is no history of Heart attack, Stroke, or Hypertension.  ROS:   Please see the history of present illness.     All other systems reviewed and are negative.  EKGs/Labs/Other Studies Reviewed:    The following studies were reviewed today:  Echo 06/06/2013 LV EF: 50% -  55%   ------------------------------------------------------------  Indications:   Chest pain 786.51.   ------------------------------------------------------------  History:  PMH:  Chronic obstructive pulmonary disease.  Risk factors: NSTEMI.    ------------------------------------------------------------  Study Conclusions   Left ventricle: The cavity size was normal. Systolic  function was normal. The estimated ejection fraction was in  the range of 50% to 55%. Regional wall motion abnormalities  cannot be excluded. Doppler parameters are consistent with  abnormal left ventricular relaxation (grade 1 diastolic  dysfunction).         EKG:  EKG is ordered today.  The ekg ordered today demonstrates normal sinus rhythm, no significant ST-T wave changes.  Recent Labs: 12/25/2019: TSH 1.347 01/15/2020: ALT 13; BUN 14; Creatinine 0.72; Hemoglobin 12.8; Platelet Count 175; Potassium 4.4; Sodium 138  Recent Lipid Panel    Component Value Date/Time   CHOL 190 02/20/2019 0000   TRIG 45 02/20/2019 0000   HDL 87 02/20/2019 0000   CHOLHDL 2.2 02/20/2019 0000   CHOLHDL 2.2 08/18/2015 1041   VLDL 12 08/18/2015 1041   LDLCALC 94 02/20/2019 0000    Physical Exam:    VS:  BP (!) 143/83   Pulse 71   Ht 5\' 2"  (1.575 m)   Wt 94 lb (42.6 kg)   SpO2 100%   BMI 17.19 kg/m     Wt Readings from Last 3 Encounters:  01/22/20 94 lb (42.6 kg)  01/18/20 92 lb (41.7 kg)  01/15/20 93 lb 3.2 oz (42.3 kg)     GEN:  Well nourished, well developed in no acute distress HEENT: Normal NECK: No JVD; No carotid bruits LYMPHATICS: No lymphadenopathy CARDIAC: RRR, no murmurs, rubs, gallops RESPIRATORY:  Clear to auscultation without rales, wheezing or rhonchi  ABDOMEN: Soft, non-tender, non-distended MUSCULOSKELETAL:  No edema; No deformity  SKIN: Warm and dry NEUROLOGIC:  Alert and oriented x 3 PSYCHIATRIC:  Normal affect   ASSESSMENT:    1. Palpitations   2. Chronic obstructive pulmonary disease, unspecified COPD type ([place])   3. Coronary artery disease involving native coronary artery of native heart without angina pectoris   4. Hyperlipidemia LDL goal <70   5. Malignant neoplasm of lung, unspecified laterality, unspecified part  of lung ([place])    PLAN:    In order of problems listed above:  1. Palpitation: No significant increase recently  2. COPD: On home O2.  No recent exacerbation  3. CAD: Minimal coronary artery disease and previous cardiac catheterization.  Denies any recent chest  pain  4. Hyperlipidemia: On Lipitor  5. Stage IV lung cancer: On palliative chemotherapy.   Medication Adjustments/Labs and Tests Ordered: Current medicines are reviewed at length with the patient today.  Concerns regarding medicines are outlined above.  Orders Placed This Encounter  Procedures  . EKG 12-Lead   No orders of the defined types were placed in this encounter.   Patient Instructions  Medication Instructions:  Your physician recommends that you continue on your current medications as directed. Please refer to the Current Medication list given to you today.  *If you need a refill on your cardiac medications before your next appointment, please call your pharmacy*  Lab Work: NONE ordered at this time of appointment   If you have labs (blood work) drawn today and your tests are completely normal, you will receive your results only by: Marland Kitchen MyChart Message (if you have MyChart) OR . A paper copy in the mail If you have any lab test that is abnormal or we need to change your treatment, we will call you to review the results.  Testing/Procedures: NONE ordered at this time of appointment   Follow-Up: At [facility], you and your health needs are our priority.  As part of our continuing mission to provide you with exceptional heart care, we have created designated Provider Care Teams.  These Care Teams include your primary Cardiologist (physician) and Advanced Practice Providers (APPs -  Physician Assistants and Nurse Practitioners) who all work together to provide you with the care you need, when you need it.  We recommend signing up for the patient portal called "MyChart".  Sign up information is provided on  this After Visit Summary.  MyChart is used to connect with patients for Virtual Visits (Telemedicine).  Patients are able to view lab/test results, encounter notes, upcoming appointments, etc.  Non-urgent messages can be sent to your provider as well.   To learn more about what you can do with MyChart, go to [email or web address].    Your next appointment:   3 month(s)  The format for your next appointment:   In Person  Provider:   [name], PA-C  Other Instructions      Signed, [name], [place]  01/24/2020 9:58 PM    [place]

## 2020-01-24 ENCOUNTER — Encounter: Payer: Self-pay | Admitting: Physician Assistant

## 2020-02-03 NOTE — Progress Notes (Signed)
[facility] OFFICE PROGRESS NOTE  Brooke [name], [name] [address]  DIAGNOSIS: Metastatic Non-Small Cell Lung Cancer, Squamous cell carcinoma   CURRENT THERAPY: Keytruda 200 mg IV every 3 weeks.   INTERVAL HISTORY: Brooke [name] 74 y.o. female returns to the clinic for a follow up visit. The patient is feeling well today without any concerning complaints except for some mild chafing on her neck from her necklace which she wears all of the time to carry her pulse ox. No rash noted in the area. The patient continues to tolerate treatment with single agent immunotherapy with Keytruda well without any adverse effects. Denies any fever, chills, night sweats, or weight loss. The patient has COPD and wears oxygen via nasal cannulas as needed. She recently had a follow up with her pulmonologist. She reports her baseline shortness of breath with exertion. Denies any chest pain, cough, or hemoptysis. Denies any nausea, vomiting, diarrhea, or constipation. Denies any headache or visual changes. Denies any rashes or skin changes except she states she sometimes has small spots that come up on her back that are non-tender and non-pruritic and resolve with neosporin. The patient is here today for evaluation prior to starting cycle # 43    MEDICAL HISTORY: Past Medical History:  Diagnosis Date  . Chronic respiratory failure (HCC)    a. on home O2.  Marland Kitchen COPD (chronic obstructive pulmonary disease) ([place])    a. Home O2.  . Former tobacco use   . GERD (gastroesophageal reflux disease)   . Hyperlipidemia   . Liver metastases ([place])   . lung ca 10/2016  . 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 NSTEMI (non-ST elevated myocardial infarction) ([place])    a. 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 Coronary vasospasm    ALLERGIES:  is allergic to codeine, imdur [isosorbide dinitrate], prednisone, and pulmicort  [budesonide].  MEDICATIONS:  Current Outpatient Medications  Medication Sig Dispense Refill  . acetaminophen (TYLENOL) 325 MG tablet Per bottle as needed    . albuterol (PROVENTIL HFA;VENTOLIN HFA) 108 (90 BASE) MCG/ACT inhaler Inhale 2 puffs into the lungs every 4 (four) hours as needed for wheezing or shortness of breath. **PLAN B**    . ALPRAZolam (XANAX) 0.25 MG tablet 1/2-1 twice daily as needed  1  . atorvastatin (LIPITOR) 20 MG tablet TAKE 1 TABLET(20 MG) BY MOUTH DAILY AT 6 PM 90 tablet 3  . budesonide-formoterol (SYMBICORT) 160-4.5 MCG/ACT inhaler INHALE 2 PUFFS INTO THE LUNGS TWICE DAILY    . docusate sodium (COLACE) 100 MG capsule Take 100-200 mg by mouth daily.     Marland Kitchen guaiFENesin (MUCINEX) 600 MG 12 hr tablet Take 600 mg by mouth 2 (two) times daily as needed for cough or to loosen phlegm.     Marland Kitchen ibuprofen (ADVIL,MOTRIN) 600 MG tablet Take 600 mg by mouth every 6 (six) hours as needed.    Marland Kitchen ipratropium-albuterol (DUONEB) 0.5-2.5 (3) MG/3ML SOLN Take 3 mLs by nebulization every 4 (four) hours as needed (wheezing/shortness of breath). **PLAN C**    . nitroGLYCERIN (NITROSTAT) 0.4 MG SL tablet ONE TABLET UNDER TONGUE AS NEEDED FOR CHEST PAIN EVERY 5 MINUTES FOR 3 DOSES 25 tablet 3  . NON FORMULARY [facility]  Onychomycosis Nail Lacquer -  Fluconazole 2%, Terbinafine 1% DMSO/undecylenic acid 25% Apply to affected nail once daily Qty. 120 gm 3 refills     . OXYGEN 2lpm 24/7    .  Probiotic Product (PROBIOTIC ADVANCED PO) Take 1 tablet by mouth daily.    . Simethicone (GAS RELIEF) 180 MG CAPS Use as needed     No current facility-administered medications for this visit.    SURGICAL HISTORY:  Past Surgical History:  Procedure Laterality Date  . LEFT HEART CATHETERIZATION WITH CORONARY ANGIOGRAM N/A 06/08/2013   Procedure: LEFT HEART CATHETERIZATION WITH CORONARY ANGIOGRAM;  Surgeon: Sanda Klein, MD;  Location: [facility] CATH [facility];  Service: Cardiovascular;  Laterality: N/A;     REVIEW OF SYSTEMS:   Review of Systems  Constitutional: Negative for appetite change, chills, fatigue, fever and unexpected weight change.  HENT: Negative for mouth sores, nosebleeds, sore throat and trouble swallowing.   Eyes: Negative for eye problems and icterus.  Respiratory: Positive for baseline dyspnea on exertion.  Negative for cough, hemoptysis, shortness of breath and wheezing.   Cardiovascular: Negative for chest pain and leg swelling.  Gastrointestinal: Negative for abdominal pain, constipation, diarrhea, nausea and vomiting.  Genitourinary: Negative for bladder incontinence, difficulty urinating, dysuria, frequency and hematuria.   Musculoskeletal: Negative for back pain, gait problem, neck pain and neck stiffness.  Skin: Intermittent red spots on her back. negative for itching. Neurological: Negative for dizziness, extremity weakness, gait problem, headaches, light-headedness and seizures.  Hematological: Negative for adenopathy. Does not bruise/bleed easily.  Psychiatric/Behavioral: Negative for confusion, depression and sleep disturbance. The patient is not nervous/anxious.     PHYSICAL EXAMINATION:  Blood pressure (!) 153/87, pulse 80, temperature (!) 97.4 F (36.3 C), resp. rate 20, height '5\' 2"'  (1.575 m), weight 92 lb 12.8 oz (42.1 kg), SpO2 100 %.  ECOG PERFORMANCE STATUS: 1 - Symptomatic but completely ambulatory  Physical Exam  Constitutional: Oriented to person, place, and time and thin appearing female and in no distress.  HENT:  Head: Normocephalic and atraumatic.  Mouth/Throat: Oropharynx is clear and moist. No oropharyngeal exudate.  Eyes: Conjunctivae are normal. Right eye exhibits no discharge. Left eye exhibits no discharge. No scleral icterus.  Neck: Normal range of motion. Neck supple.  Cardiovascular: Normal rate, regular rhythm, normal heart sounds and intact distal pulses.   Pulmonary/Chest: Effort normal and breath sounds normal. No respiratory  distress. No wheezes. No rales.  Abdominal: Soft. Bowel sounds are normal. Exhibits no distension and no mass. There is no tenderness.  Musculoskeletal: Normal range of motion. Exhibits no edema.  Lymphadenopathy:    No cervical adenopathy.  Neurological: Alert and oriented to person, place, and time. Exhibits normal muscle tone. Gait normal. Coordination normal.  Skin: Skin is warm and dry. No rash noted at this time on her back or neck. Not diaphoretic. No erythema. No pallor.  Psychiatric: Mood, memory and judgment normal.  Vitals reviewed.  LABORATORY DATA: Lab Results  Component Value Date   WBC 4.3 02/05/2020   HGB 13.3 02/05/2020   HCT 41.4 02/05/2020   MCV 86.1 02/05/2020   PLT 185 02/05/2020      Chemistry      Component Value Date/Time   NA 137 02/05/2020 0901   NA 139 04/02/2017 1026   K 4.0 02/05/2020 0901   K 4.1 04/02/2017 1026   CL 104 02/05/2020 0901   CO2 27 02/05/2020 0901   CO2 25 04/02/2017 1026   BUN 12 02/05/2020 0901   BUN 19.6 04/02/2017 1026   CREATININE 0.66 02/05/2020 0901   CREATININE 0.6 04/02/2017 1026      Component Value Date/Time   CALCIUM 9.3 02/05/2020 0901   CALCIUM 9.3 04/02/2017 1026  ALKPHOS 61 02/05/2020 0901   ALKPHOS 86 04/02/2017 1026   AST 17 02/05/2020 0901   AST 17 04/02/2017 1026   ALT 15 02/05/2020 0901   ALT 18 04/02/2017 1026   BILITOT 0.4 02/05/2020 0901   BILITOT 0.25 04/02/2017 1026       RADIOGRAPHIC STUDIES:  No results found.   ASSESSMENT/PLAN:  1. 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  PDL1  60%  Left lung radiation 11/08/2016 through 11/21/2016  CT chest 12/12/2016-reexpansion of left upper lobe with a decreased left upper lobe mass, unchanged mediastinal adenopathy, progression of a splenic metastasis/pancreatic tail/gastrohepatic ligament metastasis, right lower lobe pneumonia  Cycle 1 Pembrolizumab 12/14/2016  Cycle 2 Pembrolizumab 01/03/2017  Cycle 3 Pembrolizumab 01/24/2017  Cycle 4 Pembrolizumab 02/12/2017  CT 03/05/2018-decrease in left upper lobe mass, mediastinal adenopathy, and splenic mass  Cycle 5 pembrolizumab 03/07/2017  Cycle 6 Pembrolizumab 04/02/2017  Cycle 7 pembrolizumab 04/25/2017  Cycle 8 Pembrolizumab 05/14/2017  Cycle 9 Pembrolizumab 06/06/2017  CT 06/20/2017-mild decrease in left upper lobe mass, mild increase in paramediastinal left upper lobe nodule-radiation change?, decreased splenic metastasis  Cycle 10 pembrolizumab 06/25/2017  Cycle 11 pembrolizumab 07/18/2017  Cycle 12 pembrolizumab 08/29/2017  Cycle 13 pembrolizumab 09/17/2017  Cycle 14 Pembrolizumab 10/10/2017  CT chest 10/24/2017-slight enlargement of small mediastinal lymph nodes, increased left upper lobe consolidation, decreased size of spleen lesion  Cycle 15 pembrolizumab 10/29/2017  Cycle 16 Pembrolizumab 11/21/2017  Cycle 17 Pembrolizumab 12/11/2017  Cycle 18 pembrolizumab 01/02/2018  Cycle 19 Pembrolizumab 01/21/2018  Cycle 20 Pembrolizumab 02/13/2018  CT chest 02/27/2018-decreased left paratracheal node, progressive consolidation/fibrosis in the left upper lung, decreased size of splenic mass  Cycle 21 pembrolizumab 03/04/2018  Cycle 22 Pembrolizumab 03/27/2018  Cycle 23 pembrolizumab 04/15/2018  Cycle 24 pembrolizumab 05/08/2018  Cycle 25 Pembrolizumab 05/27/2018  CT chest 06/16/2018-stable left upper lung radiation fibrosis with no evidence of local tumor recurrence. Decreased left paratracheal adenopathy. No new or progressive metastatic disease in the  chest. Gastrohepatic ligament lymphadenopathy stable. Splenic metastasis decreased. T5 vertebral compression fracture with associated patchy sclerosis and no discrete osseous lesion, new in the interval.  Cycle 26 Pembrolizumab 06/19/2018  Cycle 27 pembrolizumab 07/08/2018  Cycle 28 pembrolizumab 07/31/2018  Cycle 29 pembrolizumab 08/19/2018  Cycle 30 Pembrolizumab 09/11/2018  Cycle 31 pembrolizumab 09/30/2018  Cycle 32 pembrolizumab 10/23/2018  CT chest 10/28/2018-left apical pleural-parenchymal opacity consistent with radiation scarring has become more confluent likely representing evolutionary change. Continued further decrease in size of index left paratracheal node and splenic metastasis. Gastrohepatic ligament lymph node not changed.  Cycle 33 Pembrolizumab 11/11/2018  Cycle 34 pembrolizumab 12/04/2018  Cycle 35 pembrolizumab 12/23/2018  CT chest 02/12/2019-posttreatment changes left upper lobe unchanged. Continued decrease in size of splenic metastasis. Stable appearance of left paratracheal lymph nodes. Stable gastrohepatic adenopathy. Incomplete visualization of retroperitoneal lymph nodes in the upper abdomen.  CT chest 07/02/2019-stable post treatment related changes left lung. 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 Slight regression of enlarged likely metastatic gastrohepatic lymph node.  CT chest 11/11/2019-interval enlargement of a right paratracheal lymph node measuring 14 mm, increased from 5 mm.  No new lung mass or nodularity.  New adenopathy in the upper abdomen.  2.7 cm lesion gastrohepatic ligament.  Necrotic node at the splenic hilum measures 4.7 cm.  Large node along the IVC left upper quadrant measures 2.7 cm.  Similar node left adjacent the aorta measures 1.8 cm.  Enhancing lesion in the spleen is unchanged.  Several low-density lesions in the liver are unchanged.  Pembrolizumab resumed on a 3-week schedule 12/03/2019   2. 05/29/2019 laryngoscopy-exophytic  appearing mass mid right vocal cord.   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 Tumor cells negative for p16, CMV and HSV 2. 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   Radiation 07/29/2019-08/25/2019  3. History of acute respiratory failure secondary to #1  4. Oxygen dependent COPD  5. History of a NSTEMI 2015  6. Right lung pneumonia on chest CT 12/12/2016-treated with Levaquin  7.Grade 2 rash possibly related to Pembrolizumab. Treatment held 08/06/2017.Improved 08/29/2017, treatment resumed, rash has resolved  Disposition:  This is a very pleasant 74 year old Caucasian female with metastatic non-small cell lung cancer, squamous cell carcinoma. She resumed single agent immunotherapy with Keytruda on 12/03/19. She is status post 3 cycles since resuming.   Labs were reviewed. Recommend that she proceed with cycle #4 today as scheduled.  Per Dr. Gearldine Shown last note, I will arrange for her next restaging CT scan of her chest prior to starting her next cycle of treatment.   We will see her back for a follow up visit in 3 weeks for evaluation before starting cycle #5.   The patient was advised to call immediately if she has any concerning symptoms in the interval. The patient voices understanding of current disease status and treatment options and is in agreement with the current care plan. All questions were answered. The patient knows to call the clinic with any problems, questions or concerns. We can certainly see the patient much sooner if necessary   Orders Placed This Encounter  Procedures  . CT Chest W Contrast    The patient prefers Monday 10/18 in the morning due to transportation. If possible prefers around 9:30-10 AM    Standing Status:   Future    Standing Expiration Date:   02/04/2021    Scheduling Instructions:     The patient prefers Monday 10/18 in the morning due to transportation. If possible prefers around  9:30-10 AM    Order Specific Question:   If indicated for the ordered procedure, I authorize the administration of contrast media per Radiology protocol    Answer:   Yes    Order Specific Question:   Preferred imaging location?    Answer:   [facility]  . CBC with Differential ([place] Only)    Standing Status:   Future    Number of Occurrences:   1    Standing Expiration Date:   02/04/2021     Rekha Hobbins L Marquist Binstock, PA-C 02/05/20

## 2020-02-05 ENCOUNTER — Inpatient Hospital Stay: Payer: Medicare Other | Attending: Oncology

## 2020-02-05 ENCOUNTER — Telehealth: Payer: Self-pay | Admitting: Physician Assistant

## 2020-02-05 ENCOUNTER — Inpatient Hospital Stay: Payer: Medicare Other

## 2020-02-05 ENCOUNTER — Other Ambulatory Visit: Payer: Self-pay | Admitting: Oncology

## 2020-02-05 ENCOUNTER — Inpatient Hospital Stay (HOSPITAL_BASED_OUTPATIENT_CLINIC_OR_DEPARTMENT_OTHER): Payer: Medicare Other | Admitting: Physician Assistant

## 2020-02-05 ENCOUNTER — Other Ambulatory Visit: Payer: Self-pay

## 2020-02-05 VITALS — BP 153/87 | HR 80 | Temp 97.4°F | Resp 20 | Ht 62.0 in | Wt 92.8 lb

## 2020-02-05 DIAGNOSIS — C7889 Secondary malignant neoplasm of other digestive organs: Secondary | ICD-10-CM | POA: Diagnosis not present

## 2020-02-05 DIAGNOSIS — J440 Chronic obstructive pulmonary disease with acute lower respiratory infection: Secondary | ICD-10-CM | POA: Diagnosis not present

## 2020-02-05 DIAGNOSIS — R21 Rash and other nonspecific skin eruption: Secondary | ICD-10-CM | POA: Insufficient documentation

## 2020-02-05 DIAGNOSIS — I252 Old myocardial infarction: Secondary | ICD-10-CM | POA: Insufficient documentation

## 2020-02-05 DIAGNOSIS — C3412 Malignant neoplasm of upper lobe, left bronchus or lung: Secondary | ICD-10-CM | POA: Insufficient documentation

## 2020-02-05 DIAGNOSIS — C787 Secondary malignant neoplasm of liver and intrahepatic bile duct: Secondary | ICD-10-CM | POA: Diagnosis not present

## 2020-02-05 DIAGNOSIS — Z9981 Dependence on supplemental oxygen: Secondary | ICD-10-CM | POA: Insufficient documentation

## 2020-02-05 DIAGNOSIS — Z79899 Other long term (current) drug therapy: Secondary | ICD-10-CM | POA: Diagnosis not present

## 2020-02-05 DIAGNOSIS — J189 Pneumonia, unspecified organism: Secondary | ICD-10-CM | POA: Diagnosis not present

## 2020-02-05 DIAGNOSIS — Z5112 Encounter for antineoplastic immunotherapy: Secondary | ICD-10-CM | POA: Diagnosis not present

## 2020-02-05 LAB — CMP (CANCER CENTER ONLY)
ALT: 15 U/L (ref 0–44)
AST: 17 U/L (ref 15–41)
Albumin: 3.7 g/dL (ref 3.5–5.0)
Alkaline Phosphatase: 61 U/L (ref 38–126)
Anion gap: 6 (ref 5–15)
BUN: 12 mg/dL (ref 8–23)
CO2: 27 mmol/L (ref 22–32)
Calcium: 9.3 mg/dL (ref 8.9–10.3)
Chloride: 104 mmol/L (ref 98–111)
Creatinine: 0.66 mg/dL (ref 0.44–1.00)
GFR, Est AFR Am: >60 mL/min (ref >60)
GFR, Estimated: >60 mL/min (ref >60)
Glucose, Bld: 85 mg/dL (ref 70–99)
Potassium: 4 mmol/L (ref 3.5–5.1)
Sodium: 137 mmol/L (ref 135–145)
Total Bilirubin: 0.4 mg/dL (ref 0.3–1.2)
Total Protein: 6.9 g/dL (ref 6.5–8.1)

## 2020-02-05 LAB — CBC WITH DIFFERENTIAL (CANCER CENTER ONLY)
Abs Immature Granulocytes: 0 10*3/uL (ref 0.00–0.07)
Basophils Absolute: 0 10*3/uL (ref 0.0–0.1)
Basophils Relative: 1 %
Eosinophils Absolute: 0.1 10*3/uL (ref 0.0–0.5)
Eosinophils Relative: 3 %
HCT: 41.4 % (ref 36.0–46.0)
Hemoglobin: 13.3 g/dL (ref 12.0–15.0)
Immature Granulocytes: 0 %
Lymphocytes Relative: 26 %
Lymphs Abs: 1.1 10*3/uL (ref 0.7–4.0)
MCH: 27.7 pg (ref 26.0–34.0)
MCHC: 32.1 g/dL (ref 30.0–36.0)
MCV: 86.1 fL (ref 80.0–100.0)
Monocytes Absolute: 0.4 10*3/uL (ref 0.1–1.0)
Monocytes Relative: 9 %
Neutro Abs: 2.6 10*3/uL (ref 1.7–7.7)
Neutrophils Relative %: 61 %
Platelet Count: 185 10*3/uL (ref 150–400)
RBC: 4.81 MIL/uL (ref 3.87–5.11)
RDW: 17.6 % — ABNORMAL HIGH (ref 11.5–15.5)
WBC Count: 4.3 10*3/uL (ref 4.0–10.5)
nRBC: 0 % (ref 0.0–0.2)

## 2020-02-05 MED ORDER — SODIUM CHLORIDE 0.9 % IV SOLN
Freq: Once | INTRAVENOUS | Status: AC
  Filled 2020-02-05: qty 250

## 2020-02-05 MED ORDER — SODIUM CHLORIDE 0.9 % IV SOLN
200.0000 mg | Freq: Once | INTRAVENOUS | Status: AC
  Administered 2020-02-05: 200 mg via INTRAVENOUS
  Filled 2020-02-05: qty 8

## 2020-02-05 NOTE — Telephone Encounter (Signed)
Scheduled per los. Gave avs and calendar  

## 2020-02-05 NOTE — Patient Instructions (Signed)
Cancer Center Discharge Instructions for Patients Receiving Chemotherapy  Today you received the following chemotherapy agents :  Keytruda.  To help prevent nausea and vomiting after your treatment, we encourage you to take your nausea medication as prescribed.   If you develop nausea and vomiting that is not controlled by your nausea medication, call the clinic.   BELOW ARE SYMPTOMS THAT SHOULD BE REPORTED IMMEDIATELY:  *FEVER GREATER THAN 100.5 F  *CHILLS WITH OR WITHOUT FEVER  NAUSEA AND VOMITING THAT IS NOT CONTROLLED WITH YOUR NAUSEA MEDICATION  *UNUSUAL SHORTNESS OF BREATH  *UNUSUAL BRUISING OR BLEEDING  TENDERNESS IN MOUTH AND THROAT WITH OR WITHOUT PRESENCE OF ULCERS  *URINARY PROBLEMS  *BOWEL PROBLEMS  UNUSUAL RASH Items with * indicate a potential emergency and should be followed up as soon as possible.  Feel free to call the clinic should you have any questions or concerns. The clinic phone number is [phone].  Please show the CHEMO ALERT CARD at check-in to the Emergency Department and triage nurse.  

## 2020-02-09 ENCOUNTER — Telehealth: Payer: Self-pay | Admitting: *Deleted

## 2020-02-09 NOTE — Telephone Encounter (Signed)
CALLED PATIENT TO ALTER FU ON 02-12-20 DUE TO HAVING JUST SEEN DR. Blenda Nicely, RESCHEDULED FOR 04-22-20 @ 11 AM, LVM FOR A RETURN CALL

## 2020-02-12 ENCOUNTER — Ambulatory Visit: Payer: Medicare Other | Admitting: Radiation Oncology

## 2020-02-20 ENCOUNTER — Other Ambulatory Visit: Payer: Self-pay | Admitting: Oncology

## 2020-02-22 ENCOUNTER — Other Ambulatory Visit: Payer: Self-pay

## 2020-02-22 ENCOUNTER — Ambulatory Visit (HOSPITAL_COMMUNITY)
Admission: RE | Admit: 2020-02-22 | Discharge: 2020-02-22 | Disposition: A | Discharge status: Home / Self Care | Payer: Medicare Other | Source: Ambulatory Visit | Attending: Physician Assistant | Admitting: Physician Assistant

## 2020-02-22 ENCOUNTER — Encounter (HOSPITAL_COMMUNITY): Payer: Self-pay

## 2020-02-22 DIAGNOSIS — C3412 Malignant neoplasm of upper lobe, left bronchus or lung: Secondary | ICD-10-CM | POA: Diagnosis present

## 2020-02-22 MED ORDER — IOHEXOL 300 MG/ML  SOLN
100.0000 mL | Freq: Once | INTRAMUSCULAR | Status: AC | PRN
  Administered 2020-02-22: 75 mL via INTRAVENOUS

## 2020-02-25 ENCOUNTER — Other Ambulatory Visit: Payer: Self-pay | Admitting: *Deleted

## 2020-02-25 DIAGNOSIS — C3412 Malignant neoplasm of upper lobe, left bronchus or lung: Secondary | ICD-10-CM

## 2020-02-26 ENCOUNTER — Inpatient Hospital Stay (HOSPITAL_BASED_OUTPATIENT_CLINIC_OR_DEPARTMENT_OTHER): Payer: Medicare Other | Admitting: Nurse Practitioner

## 2020-02-26 ENCOUNTER — Telehealth: Payer: Self-pay | Admitting: Nurse Practitioner

## 2020-02-26 ENCOUNTER — Inpatient Hospital Stay: Payer: Medicare Other

## 2020-02-26 ENCOUNTER — Encounter: Payer: Self-pay | Admitting: Nurse Practitioner

## 2020-02-26 ENCOUNTER — Other Ambulatory Visit: Payer: Self-pay

## 2020-02-26 VITALS — BP 169/92 | HR 80 | Temp 97.0°F | Resp 16 | Ht 62.0 in | Wt 95.8 lb

## 2020-02-26 DIAGNOSIS — C3412 Malignant neoplasm of upper lobe, left bronchus or lung: Secondary | ICD-10-CM

## 2020-02-26 LAB — CBC WITH DIFFERENTIAL (CANCER CENTER ONLY)
Abs Immature Granulocytes: 0 10*3/uL (ref 0.00–0.07)
Basophils Absolute: 0 10*3/uL (ref 0.0–0.1)
Basophils Relative: 0 %
Eosinophils Absolute: 0.2 10*3/uL (ref 0.0–0.5)
Eosinophils Relative: 3 %
HCT: 40.8 % (ref 36.0–46.0)
Hemoglobin: 13.2 g/dL (ref 12.0–15.0)
Immature Granulocytes: 0 %
Lymphocytes Relative: 22 %
Lymphs Abs: 1.1 10*3/uL (ref 0.7–4.0)
MCH: 27.7 pg (ref 26.0–34.0)
MCHC: 32.4 g/dL (ref 30.0–36.0)
MCV: 85.7 fL (ref 80.0–100.0)
Monocytes Absolute: 0.3 10*3/uL (ref 0.1–1.0)
Monocytes Relative: 6 %
Neutro Abs: 3.3 10*3/uL (ref 1.7–7.7)
Neutrophils Relative %: 69 %
Platelet Count: 166 10*3/uL (ref 150–400)
RBC: 4.76 MIL/uL (ref 3.87–5.11)
RDW: 17.2 % — ABNORMAL HIGH (ref 11.5–15.5)
WBC Count: 4.9 10*3/uL (ref 4.0–10.5)
nRBC: 0 % (ref 0.0–0.2)

## 2020-02-26 LAB — CMP (CANCER CENTER ONLY)
ALT: 13 U/L (ref 0–44)
AST: 20 U/L (ref 15–41)
Albumin: 4 g/dL (ref 3.5–5.0)
Alkaline Phosphatase: 65 U/L (ref 38–126)
Anion gap: 7 (ref 5–15)
BUN: 16 mg/dL (ref 8–23)
CO2: 27 mmol/L (ref 22–32)
Calcium: 9.7 mg/dL (ref 8.9–10.3)
Chloride: 104 mmol/L (ref 98–111)
Creatinine: 0.68 mg/dL (ref 0.44–1.00)
GFR, Estimated: >60 mL/min (ref >60)
Glucose, Bld: 94 mg/dL (ref 70–99)
Potassium: 4.2 mmol/L (ref 3.5–5.1)
Sodium: 138 mmol/L (ref 135–145)
Total Bilirubin: 0.4 mg/dL (ref 0.3–1.2)
Total Protein: 7.1 g/dL (ref 6.5–8.1)

## 2020-02-26 LAB — TSH: TSH: 1.568 u[IU]/mL (ref 0.308–3.960)

## 2020-02-26 MED ORDER — SODIUM CHLORIDE 0.9 % IV SOLN
200.0000 mg | Freq: Once | INTRAVENOUS | Status: AC
  Administered 2020-02-26: 200 mg via INTRAVENOUS
  Filled 2020-02-26: qty 8

## 2020-02-26 MED ORDER — SODIUM CHLORIDE 0.9 % IV SOLN
Freq: Once | INTRAVENOUS | Status: AC
  Filled 2020-02-26: qty 250

## 2020-02-26 NOTE — Progress Notes (Signed)
[facility] OFFICE PROGRESS NOTE   Diagnosis: Non-small cell lung cancer  INTERVAL HISTORY:   [name] returns as scheduled.  She completed another cycle of Pembrolizumab 02/05/2020.  No diarrhea or rash.  No nausea or vomiting.  No mouth sores.  Stable dyspnea on exertion.  No fever or cough.  We discussed the elevated blood pressure.  She reports the blood pressure was normal when checked at home earlier this week by a nurse.  Objective:  Vital signs in last 24 hours:  Blood pressure (!) 169/92, pulse 80, temperature (!) 97 F (36.1 C), temperature source Tympanic, resp. rate 16, height 5' 2" (1.575 m), weight 95 lb 12.8 oz (43.5 kg), SpO2 98 %.    HEENT: No thrush or ulcers. Resp: Distant breath sounds.  No respiratory distress. Cardio: Regular rate and rhythm. GI: Abdomen soft and nontender.  No hepatosplenomegaly. Vascular: No leg edema. Neuro: Alert and oriented. Skin: No rash.   Lab Results:  Lab Results  Component Value Date   WBC 4.9 02/26/2020   HGB 13.2 02/26/2020   HCT 40.8 02/26/2020   MCV 85.7 02/26/2020   PLT 166 02/26/2020   NEUTROABS 3.3 02/26/2020    Imaging:  No results found.  Medications: I have reviewed the patient's current medications.  Assessment/Plan: 1. 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  PDL1 60%  Left lung radiation 11/08/2016 through 11/21/2016  CT chest 12/12/2016-reexpansion of left upper lobe with a decreased left upper lobe mass, unchanged mediastinal adenopathy, progression of a 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, right lower lobe pneumonia  Cycle 1 Pembrolizumab 12/14/2016  Cycle 2 Pembrolizumab 01/03/2017  Cycle 3 Pembrolizumab 01/24/2017  Cycle 4 Pembrolizumab 02/12/2017  CT 03/05/2018-decrease in left upper lobe mass, mediastinal adenopathy, and splenic mass  Cycle 5 pembrolizumab 03/07/2017  Cycle 6 Pembrolizumab 04/02/2017  Cycle 7 pembrolizumab 04/25/2017  Cycle 8 Pembrolizumab 05/14/2017  Cycle 9 Pembrolizumab 06/06/2017  CT 06/20/2017-mild decrease in left upper lobe mass, mild increase in paramediastinal left upper lobe nodule-radiation change?, decreased splenic metastasis  Cycle 10 pembrolizumab 06/25/2017  Cycle 11 pembrolizumab 07/18/2017  Cycle 12 pembrolizumab 08/29/2017  Cycle 13 pembrolizumab 09/17/2017  Cycle 14 Pembrolizumab 10/10/2017  CT chest 10/24/2017-slight enlargement of small mediastinal lymph nodes, increased left upper lobe consolidation, decreased size of spleen lesion  Cycle 15 pembrolizumab 10/29/2017  Cycle 16 Pembrolizumab 11/21/2017  Cycle 17 Pembrolizumab 12/11/2017  Cycle 18 pembrolizumab 01/02/2018  Cycle 19 Pembrolizumab 01/21/2018  Cycle 20 Pembrolizumab 02/13/2018  CT chest 02/27/2018-decreased left paratracheal node, progressive consolidation/fibrosis in the left upper lung, decreased size of splenic mass  Cycle 21 pembrolizumab 03/04/2018  Cycle 22 Pembrolizumab 03/27/2018  Cycle 23 pembrolizumab 04/15/2018  Cycle 24 pembrolizumab 05/08/2018  Cycle 25 Pembrolizumab 05/27/2018  CT chest 06/16/2018-stable left upper lung radiation fibrosis with no evidence of local tumor recurrence. Decreased left paratracheal adenopathy. No new or progressive metastatic disease in the chest. Gastrohepatic ligament lymphadenopathy stable. Splenic metastasis decreased. T5 vertebral compression fracture with associated patchy sclerosis and no discrete osseous lesion, new in the  interval.  Cycle 26 Pembrolizumab 06/19/2018  Cycle 27 pembrolizumab 07/08/2018  Cycle 28 pembrolizumab 07/31/2018  Cycle 29 pembrolizumab  08/19/2018  Cycle 30 Pembrolizumab 09/11/2018  Cycle 31 pembrolizumab 09/30/2018  Cycle 32 pembrolizumab 10/23/2018  CT chest 10/28/2018-left apical pleural-parenchymal opacity consistent with radiation scarring has become more confluent likely representing evolutionary change. Continued further decrease in size of index left paratracheal node and splenic metastasis. Gastrohepatic ligament lymph node not changed.  Cycle 33 Pembrolizumab 11/11/2018  Cycle 34 pembrolizumab 12/04/2018  Cycle 35 pembrolizumab 12/23/2018  CT chest 02/12/2019-posttreatment changes left upper lobe unchanged. Continued decrease in size of splenic metastasis. Stable appearance of left paratracheal lymph nodes. Stable gastrohepatic adenopathy. Incomplete visualization of retroperitoneal lymph nodes in the upper abdomen.  CT chest 07/02/2019-stable post treatment related changes left lung. 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 Slight regression of enlarged likely metastatic gastrohepatic lymph node.  CT chest 11/11/2019-interval enlargement of a right paratracheal lymph node measuring 14 mm, increased from 5 mm.  No new lung mass or nodularity.  New adenopathy in the upper abdomen.  2.7 cm lesion gastrohepatic ligament.  Necrotic node at the splenic hilum measures 4.7 cm.  Large node along the IVC left upper quadrant measures 2.7 cm.  Similar node left adjacent the aorta measures 1.8 cm.  Enhancing lesion in the spleen is unchanged.  Several low-density lesions in the liver are unchanged.  Pembrolizumab resumed on a 3-week schedule 12/03/2019  CT chest 02/22/2020-interval resolution of previously demonstrated left paratracheal and upper abdominal adenopathy.  Stable treated metastasis within the spleen.  Stable radiation changes in the left lung with left hilar distortion.  No  evidence of local recurrence or progressive metastatic disease.  Continuation of pembrolizumab every 3 weeks 02/26/2020  2. 05/29/2019 laryngoscopy-exophytic appearing mass mid right vocal cord.   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 Tumor cells negative for p16, CMV and HSV 2. 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   Radiation 07/29/2019-08/25/2019  3. History of acute respiratory failure secondary to #1  4. Oxygen dependent COPD  5. History of a NSTEMI 2015  6. Right lung pneumonia on chest CT 12/12/2016-treated with Levaquin  7.Grade 2 rash possibly related to Pembrolizumab. Treatment held 08/06/2017.Improved 08/29/2017, treatment resumed, rash has resolved  Disposition: [name] appears stable.  She has completed 4 cycles of every 3-week pembrolizumab.  She is tolerating treatment well.  We reviewed the recent restaging chest CT which shows resolution of paratracheal and upper abdominal lymph nodes, stable metastasis within the spleen, no new or progressive metastatic disease.  Plan to continue pembrolizumab on a 3-week schedule, treatment today.  We reviewed the CBC and chemistry panel from today.  Labs adequate for treatment.  She will return for lab, follow-up, pembrolizumab in 3 weeks.    [name] ANP/GNP-BC   02/26/2020  9:17 AM

## 2020-02-26 NOTE — Patient Instructions (Signed)
[facility] Discharge Instructions for Patients Receiving Chemotherapy  Today you received the following chemotherapy agents :  Keytruda.  To help prevent nausea and vomiting after your treatment, we encourage you to take your nausea medication as prescribed.   If you develop nausea and vomiting that is not controlled by your nausea medication, call the clinic.   BELOW ARE SYMPTOMS THAT SHOULD BE REPORTED IMMEDIATELY:  *FEVER GREATER THAN 100.5 F  *CHILLS WITH OR WITHOUT FEVER  NAUSEA AND VOMITING THAT IS NOT CONTROLLED WITH YOUR NAUSEA MEDICATION  *UNUSUAL SHORTNESS OF BREATH  *UNUSUAL BRUISING OR BLEEDING  TENDERNESS IN MOUTH AND THROAT WITH OR WITHOUT PRESENCE OF ULCERS  *URINARY PROBLEMS  *BOWEL PROBLEMS  UNUSUAL RASH Items with * indicate a potential emergency and should be followed up as soon as possible.  Feel free to call the clinic should you have any questions or concerns. The clinic phone number is [phone].  Please show the CHEMO ALERT CARD at check-in to the Emergency Department and triage nurse.  

## 2020-02-26 NOTE — Telephone Encounter (Signed)
Scheduled per los. Gave avs and calendar  

## 2020-03-03 ENCOUNTER — Other Ambulatory Visit: Payer: Self-pay | Admitting: Physician Assistant

## 2020-03-03 NOTE — Telephone Encounter (Signed)
*  STAT* If patient is at the pharmacy, call can be transferred to refill team.   1. Which medications need to be refilled? (please list name of each medication and dose if known) Atorvastatin  2. Which pharmacy/location (including street and city if local pharmacy) is medication to be sent to? [facility] [place]  3. Do they need a 30 day or 90 day supply?  90 days and refills

## 2020-03-03 NOTE — Telephone Encounter (Signed)
Rx has been sent to the pharmacy electronically. ° °

## 2020-03-18 ENCOUNTER — Inpatient Hospital Stay: Payer: Medicare Other | Attending: Oncology | Admitting: Nurse Practitioner

## 2020-03-18 ENCOUNTER — Inpatient Hospital Stay: Payer: Medicare Other

## 2020-03-18 ENCOUNTER — Encounter: Payer: Self-pay | Admitting: Nurse Practitioner

## 2020-03-18 ENCOUNTER — Other Ambulatory Visit: Payer: Self-pay

## 2020-03-18 VITALS — BP 142/90 | HR 87 | Temp 98.7°F | Resp 17 | Ht 62.0 in | Wt 95.6 lb

## 2020-03-18 VITALS — BP 150/79 | HR 72

## 2020-03-18 DIAGNOSIS — C3412 Malignant neoplasm of upper lobe, left bronchus or lung: Secondary | ICD-10-CM | POA: Insufficient documentation

## 2020-03-18 DIAGNOSIS — C7889 Secondary malignant neoplasm of other digestive organs: Secondary | ICD-10-CM | POA: Diagnosis not present

## 2020-03-18 DIAGNOSIS — Z79899 Other long term (current) drug therapy: Secondary | ICD-10-CM | POA: Insufficient documentation

## 2020-03-18 DIAGNOSIS — J449 Chronic obstructive pulmonary disease, unspecified: Secondary | ICD-10-CM | POA: Insufficient documentation

## 2020-03-18 DIAGNOSIS — Z9981 Dependence on supplemental oxygen: Secondary | ICD-10-CM | POA: Insufficient documentation

## 2020-03-18 DIAGNOSIS — I252 Old myocardial infarction: Secondary | ICD-10-CM | POA: Insufficient documentation

## 2020-03-18 DIAGNOSIS — Z5112 Encounter for antineoplastic immunotherapy: Secondary | ICD-10-CM | POA: Insufficient documentation

## 2020-03-18 LAB — CBC WITH DIFFERENTIAL (CANCER CENTER ONLY)
Abs Immature Granulocytes: 0 10*3/uL (ref 0.00–0.07)
Basophils Absolute: 0 10*3/uL (ref 0.0–0.1)
Basophils Relative: 0 %
Eosinophils Absolute: 0.2 10*3/uL (ref 0.0–0.5)
Eosinophils Relative: 3 %
HCT: 41.1 % (ref 36.0–46.0)
Hemoglobin: 13.2 g/dL (ref 12.0–15.0)
Immature Granulocytes: 0 %
Lymphocytes Relative: 24 %
Lymphs Abs: 1.1 10*3/uL (ref 0.7–4.0)
MCH: 27.8 pg (ref 26.0–34.0)
MCHC: 32.1 g/dL (ref 30.0–36.0)
MCV: 86.5 fL (ref 80.0–100.0)
Monocytes Absolute: 0.4 10*3/uL (ref 0.1–1.0)
Monocytes Relative: 9 %
Neutro Abs: 2.9 10*3/uL (ref 1.7–7.7)
Neutrophils Relative %: 64 %
Platelet Count: 185 10*3/uL (ref 150–400)
RBC: 4.75 MIL/uL (ref 3.87–5.11)
RDW: 16.2 % — ABNORMAL HIGH (ref 11.5–15.5)
WBC Count: 4.5 10*3/uL (ref 4.0–10.5)
nRBC: 0 % (ref 0.0–0.2)

## 2020-03-18 LAB — CMP (CANCER CENTER ONLY)
ALT: 13 U/L (ref 0–44)
AST: 17 U/L (ref 15–41)
Albumin: 3.9 g/dL (ref 3.5–5.0)
Alkaline Phosphatase: 59 U/L (ref 38–126)
Anion gap: 6 (ref 5–15)
BUN: 17 mg/dL (ref 8–23)
CO2: 27 mmol/L (ref 22–32)
Calcium: 9.3 mg/dL (ref 8.9–10.3)
Chloride: 104 mmol/L (ref 98–111)
Creatinine: 0.65 mg/dL (ref 0.44–1.00)
GFR, Estimated: >60 mL/min (ref >60)
Glucose, Bld: 90 mg/dL (ref 70–99)
Potassium: 4.3 mmol/L (ref 3.5–5.1)
Sodium: 137 mmol/L (ref 135–145)
Total Bilirubin: 0.5 mg/dL (ref 0.3–1.2)
Total Protein: 7.2 g/dL (ref 6.5–8.1)

## 2020-03-18 MED ORDER — SODIUM CHLORIDE 0.9 % IV SOLN
Freq: Once | INTRAVENOUS | Status: AC
  Filled 2020-03-18: qty 250

## 2020-03-18 MED ORDER — SODIUM CHLORIDE 0.9 % IV SOLN
200.0000 mg | Freq: Once | INTRAVENOUS | Status: AC
  Administered 2020-03-18: 200 mg via INTRAVENOUS
  Filled 2020-03-18: qty 8

## 2020-03-18 NOTE — Addendum Note (Signed)
Addended by: Betsy Coder B on: 03/18/2020 11:12 AM   Modules accepted: Orders

## 2020-03-18 NOTE — Patient Instructions (Signed)
[facility] Discharge Instructions for Patients Receiving Chemotherapy  Today you received the following immunotherapy agent: Pembrolizumab Brooke [name])  To help prevent nausea and vomiting after your treatment, we encourage you to take your nausea medication as directed by your MD.   If you develop nausea and vomiting that is not controlled by your nausea medication, call the clinic.   BELOW ARE SYMPTOMS THAT SHOULD BE REPORTED IMMEDIATELY:  *FEVER GREATER THAN 100.5 F  *CHILLS WITH OR WITHOUT FEVER  NAUSEA AND VOMITING THAT IS NOT CONTROLLED WITH YOUR NAUSEA MEDICATION  *UNUSUAL SHORTNESS OF BREATH  *UNUSUAL BRUISING OR BLEEDING  TENDERNESS IN MOUTH AND THROAT WITH OR WITHOUT PRESENCE OF ULCERS  *URINARY PROBLEMS  *BOWEL PROBLEMS  UNUSUAL RASH Items with * indicate a potential emergency and should be followed up as soon as possible.  Feel free to call the clinic should you have any questions or concerns. The clinic phone number is [phone].  Please show the [facility] at check-in to the Emergency Department and triage nurse.

## 2020-03-18 NOTE — Progress Notes (Signed)
[facility] OFFICE PROGRESS NOTE   Diagnosis: Non-small cell lung cancer  INTERVAL HISTORY:   Brooke [name] returns as scheduled.  She completed another cycle of Pembrolizumab 02/26/2020.  She denies nausea/vomiting.  No rash.  No diarrhea.  She is feeling anxious today.  She took one half of a Xanax tablet.  No change in baseline dyspnea.  Objective:  Vital signs in last 24 hours:  Blood pressure (!) 142/90, pulse 87, temperature 98.7 F (37.1 C), temperature source Tympanic, resp. rate 17, height '5\' 2"'  (1.575 m), weight 95 lb 9.6 oz (43.4 kg), SpO2 100 %.    HEENT: No thrush or ulcers. Resp: Distant breath sounds.  A few wheezes anteriorly.  No respiratory distress. Cardio: Regular rate and rhythm. GI: No hepatosplenomegaly. Vascular: No leg edema. Neuro: Alert and oriented. Skin: No rash.   Lab Results:  Lab Results  Component Value Date   WBC 4.5 03/18/2020   HGB 13.2 03/18/2020   HCT 41.1 03/18/2020   MCV 86.5 03/18/2020   PLT 185 03/18/2020   NEUTROABS 2.9 03/18/2020    Imaging:  No results found.  Medications: I have reviewed the patient's current medications.  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  PDL1 60%  Left lung radiation 11/08/2016 through 11/21/2016  CT chest 12/12/2016-reexpansion of left upper lobe with a decreased left upper lobe mass, unchanged mediastinal adenopathy, progression of a splenic metastasis/pancreatic tail/gastrohepatic ligament metastasis, right lower lobe pneumonia  Cycle 1 Pembrolizumab  12/14/2016  Cycle 2 Pembrolizumab 01/03/2017  Cycle 3 Pembrolizumab 01/24/2017  Cycle 4 Pembrolizumab 02/12/2017  CT 03/05/2018-decrease in left upper lobe mass, mediastinal adenopathy, and splenic mass  Cycle 5 pembrolizumab 03/07/2017  Cycle 6 Pembrolizumab 04/02/2017  Cycle 7 pembrolizumab 04/25/2017  Cycle 8 Pembrolizumab 05/14/2017  Cycle 9 Pembrolizumab 06/06/2017  CT 06/20/2017-mild decrease in left upper lobe mass, mild increase in paramediastinal left upper lobe nodule-radiation change?, decreased splenic metastasis  Cycle 10 pembrolizumab 06/25/2017  Cycle 11 pembrolizumab 07/18/2017  Cycle 12 pembrolizumab 08/29/2017  Cycle 13 pembrolizumab 09/17/2017  Cycle 14 Pembrolizumab 10/10/2017  CT chest 10/24/2017-slight enlargement of small mediastinal lymph nodes, increased left upper lobe consolidation, decreased size of spleen lesion  Cycle 15 pembrolizumab 10/29/2017  Cycle 16 Pembrolizumab 11/21/2017  Cycle 17 Pembrolizumab 12/11/2017  Cycle 18 pembrolizumab 01/02/2018  Cycle 19 Pembrolizumab 01/21/2018  Cycle 20 Pembrolizumab 02/13/2018  CT chest 02/27/2018-decreased left paratracheal node, progressive consolidation/fibrosis in the left upper lung, decreased size of splenic mass  Cycle 21 pembrolizumab 03/04/2018  Cycle 22 Pembrolizumab 03/27/2018  Cycle 23 pembrolizumab 04/15/2018  Cycle 24 pembrolizumab 05/08/2018  Cycle 25 Pembrolizumab 05/27/2018  CT chest 06/16/2018-stable left upper lung radiation fibrosis with no evidence of local tumor recurrence. Decreased left paratracheal adenopathy. No new or progressive metastatic disease in the chest. Gastrohepatic ligament lymphadenopathy stable. Splenic metastasis decreased. T5 vertebral compression fracture with associated patchy sclerosis and no discrete osseous lesion, new in the interval.  Cycle 26 Pembrolizumab 06/19/2018  Cycle 27 pembrolizumab 07/08/2018  Cycle 28 pembrolizumab 07/31/2018  Cycle 29  pembrolizumab 08/19/2018  Cycle 30 Pembrolizumab 09/11/2018  Cycle 31 pembrolizumab 09/30/2018  Cycle 32 pembrolizumab 10/23/2018  CT chest 10/28/2018-left  apical pleural-parenchymal opacity consistent with radiation scarring has become more confluent likely representing evolutionary change. Continued further decrease in size of index left paratracheal node and splenic metastasis. Gastrohepatic ligament lymph node not changed.  Cycle 33 Pembrolizumab 11/11/2018  Cycle 34 pembrolizumab 12/04/2018  Cycle 35 pembrolizumab 12/23/2018  CT chest 02/12/2019-posttreatment changes left upper lobe unchanged. Continued decrease in size of splenic metastasis. Stable appearance of left paratracheal lymph nodes. Stable gastrohepatic adenopathy. Incomplete visualization of retroperitoneal lymph nodes in the upper abdomen.  CT chest 07/02/2019-stable post treatment related changes left lung. 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 Slight regression of enlarged likely metastatic gastrohepatic lymph node.  CT chest 11/11/2019-interval enlargement of a right paratracheal lymph node measuring 14 mm, increased from 5 mm. No new lung mass or nodularity. New adenopathy in the upper abdomen. 2.7 cm lesion gastrohepatic ligament. Necrotic node at the splenic hilum measures 4.7 cm. Large node along the IVC left upper quadrant measures 2.7 cm. Similar node left adjacent the aorta measures 1.8 cm. Enhancing lesion in the spleen is unchanged. Several low-density lesions in the liver are unchanged.  Pembrolizumab resumed on a 3-week schedule 12/03/2019  CT chest 02/22/2020-interval resolution of previously demonstrated left paratracheal and upper abdominal adenopathy.  Stable treated metastasis within the spleen.  Stable radiation changes in the left lung with left hilar distortion.  No evidence of local recurrence or progressive metastatic disease.  Continuation of pembrolizumab every 3 weeks 02/26/2020  2.  05/29/2019 laryngoscopy-exophytic appearing mass mid right vocal cord.   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 Tumor cells negative for p16, CMV and HSV 2. 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   Radiation 07/29/2019-08/25/2019  3. History of acute respiratory failure secondary to #1  4. Oxygen dependent COPD  5. History of a NSTEMI 2015  6. Right lung pneumonia on chest CT 12/12/2016-treated with Levaquin  7.Grade 2 rash possibly related to Pembrolizumab. Treatment held 08/06/2017.Improved 08/29/2017, treatment resumed, rash has resolved  Disposition: Brooke [name] appears unchanged.  There is no clinical evidence of disease progression.  Plan to proceed with pembrolizumab today as scheduled.  We reviewed the CBC and chemistry panel from today.  Labs adequate to proceed with treatment.  She will return for lab, follow-up, pembrolizumab in 3 weeks.  She will contact the office in the interim with any problems.   [name] ANP/GNP-BC   03/18/2020  10:21 AM

## 2020-03-21 ENCOUNTER — Telehealth: Payer: Self-pay | Admitting: Internal Medicine

## 2020-03-21 MED ORDER — AZITHROMYCIN 250 MG PO TABS: 250.0000 mg | ORAL_TABLET | ORAL | 0 refills | Status: DC

## 2020-03-21 NOTE — Telephone Encounter (Signed)
Spoke with the pt  She is c/o increased SOB and wheezing x 3 days  She states "when I breathe in and out if sounds like bubbles and whistling"  She denies any increased coughing, but did produce a minimal amount of brown sputum 1 day ago  She denies any f/c/s, body aches  Has been fully vaccinated against covid including booster  She has been taking her symbicort daily, mucinex and using albuterol inhaler and neb both about 2 x per day  Please advise, thanks!  Last ov AVS:   Instructions  No change in symbicort as your maintenance    Only use your albuterol (either the puffer or the nebulizer) as a rescue medication to be used if you can't catch your breath by resting or doing a relaxed purse lip breathing pattern.  - The less you use it, the better it will work when you need it. - Ok to use up to every 4 hours if you must but call for immediate appointment if use goes up over your usual need   Please schedule a follow up visit in 6 months but call sooner if needed

## 2020-03-21 NOTE — Telephone Encounter (Signed)
Spoke with the pt and notified of recs per MW  She verbalized understanding  Rx was sent to pharm

## 2020-03-21 NOTE — Telephone Encounter (Signed)
zpak Ov if not better as can't take prednisone

## 2020-03-28 ENCOUNTER — Encounter: Payer: Self-pay | Admitting: Pulmonary Disease

## 2020-03-28 ENCOUNTER — Other Ambulatory Visit: Payer: Self-pay

## 2020-03-28 ENCOUNTER — Ambulatory Visit (INDEPENDENT_AMBULATORY_CARE_PROVIDER_SITE_OTHER): Payer: Medicare Other

## 2020-03-28 ENCOUNTER — Ambulatory Visit (INDEPENDENT_AMBULATORY_CARE_PROVIDER_SITE_OTHER): Payer: Medicare Other | Admitting: Pulmonary Disease

## 2020-03-28 ENCOUNTER — Telehealth: Payer: Self-pay | Admitting: Internal Medicine

## 2020-03-28 VITALS — BP 134/72 | HR 74 | Temp 97.2°F | Ht 62.5 in | Wt 97.8 lb

## 2020-03-28 DIAGNOSIS — R0602 Shortness of breath: Secondary | ICD-10-CM | POA: Diagnosis not present

## 2020-03-28 DIAGNOSIS — J9611 Chronic respiratory failure with hypoxia: Secondary | ICD-10-CM | POA: Diagnosis not present

## 2020-03-28 DIAGNOSIS — J449 Chronic obstructive pulmonary disease, unspecified: Secondary | ICD-10-CM

## 2020-03-28 DIAGNOSIS — R490 Dysphonia: Secondary | ICD-10-CM

## 2020-03-28 DIAGNOSIS — C3412 Malignant neoplasm of upper lobe, left bronchus or lung: Secondary | ICD-10-CM

## 2020-03-28 DIAGNOSIS — C320 Malignant neoplasm of glottis: Secondary | ICD-10-CM

## 2020-03-28 DIAGNOSIS — J441 Chronic obstructive pulmonary disease with (acute) exacerbation: Secondary | ICD-10-CM

## 2020-03-28 MED ORDER — METHYLPREDNISOLONE ACETATE 80 MG/ML IJ SUSP
120.0000 mg | Freq: Once | INTRAMUSCULAR | Status: AC
  Administered 2020-03-28: 120 mg via INTRAMUSCULAR

## 2020-03-28 MED ORDER — BREZTRI AEROSPHERE 160-9-4.8 MCG/ACT IN AERO: 2.0000 | INHALATION_SPRAY | Freq: Two times a day (BID) | RESPIRATORY_TRACT | 0 refills | Status: DC

## 2020-03-28 NOTE — Assessment & Plan Note (Signed)
Plan: Continue oxygen therapy Follow-up with our office in 6 weeks

## 2020-03-28 NOTE — Assessment & Plan Note (Signed)
Plan: Continue follow-up with oncology

## 2020-03-28 NOTE — Progress Notes (Signed)
Discussed results with patient in office.  Nothing further is needed at this time.  Nikolaus Pienta FNP  

## 2020-03-28 NOTE — Assessment & Plan Note (Signed)
Tory wheeze on exam today Slow to resolve COPD exacerbation Intolerant of prednisone p.o. tablets Able to tolerate Depo-Medrol injection  Plan: Depo-Medrol 120 today Trial of Breztri  Hold Symbicort 160 Continue oxygen therapy Follow-up with our office in 6 weeks

## 2020-03-28 NOTE — Assessment & Plan Note (Signed)
Slow to resolve COPD exacerbation Appropriately treated with a Z-Pak Chest x-ray today in office stable Expiratory wheeze  Plan: Depo-Medrol 120 today Trial of Breztri  Hold Symbicort 160 Continue oxygen therapy Follow-up with our office in 6 weeks

## 2020-03-28 NOTE — Telephone Encounter (Signed)
Spoke with the pt  She called last wk 03/21/20 with cough, wheezing and increased SOB  MW called in zpack for her that she finished 03/26/20  She states she felt slight improvement but then woke up this morning with more wheezing again  She states that her cough is now prod with frothy, white sputum (brown prior to zpack) She still denied any f/c/s, body aches  She does c/o very hoarse voice  Pt asking for appt  Please advise if okay for in office  Pt has been fully vaccinated against covid including booster

## 2020-03-28 NOTE — Telephone Encounter (Signed)
If she is not better after abx , will need ov for further evaluation   No fever and covid vaccine x 3 , can be in office or telemedicine for ov evaluation .   Please contact office for sooner follow up if symptoms do not improve or worsen or seek emergency care

## 2020-03-28 NOTE — Patient Instructions (Addendum)
You were seen today by Lauraine Rinne, NP  for:   1. SOB (shortness of breath) 2. COPD exacerbation ([place]) 3. COPD  GOLD IV   - DG Chest 2 View; Future  Depo-Medrol injection 120  Trial of Breztri >>> 2 puffs in the morning right when you wake up, rinse out your mouth after use, 12 hours later 2 puffs, rinse after use >>> Take this daily, no matter what >>> This is not a rescue inhaler   Use the spacer provided with this inhaler as reviewed today  We have provided you for samples.  When you finish your second sample in our beginning your third please contact our office and let us know how you are doing on this.  If you would like a prescription of this then we can send this in.  Please stop your Symbicort 160 while using the samples  Okay to use DuoNeb every 6-8 hours as needed for shortness of breath or wheezing  Only use your albuterol as a rescue medication to be used if you can't catch your breath by resting or doing a relaxed purse lip breathing pattern.  - The less you use it, the better it will work when you need it. - Ok to use up to 2 puffs  every 4 hours if you must but call for immediate appointment if use goes up over your usual need - Don't leave home without it !!  (think of it like the spare tire for your car)   4. 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 Hoarseness  Continue follow-up with oncology  If hoarseness persists may need to have repeat evaluation by either ENT, primary care or oncology   We recommend today:  Orders Placed This Encounter  Procedures  . DG Chest 2 View    Standing Status:   Future    Number of  Occurrences:   1    Standing Expiration Date:   05/28/2020    Order Specific Question:   Preferred imaging location?    Answer:   [facility]    Order Specific Question:   Reason for exam:    Answer:   copd   Orders Placed This Encounter  Procedures  . DG Chest 2 View   No orders of the defined types were placed in this encounter.   Follow Up:    Return in about 6 weeks (around 05/09/2020), or if symptoms worsen or fail to improve, for Follow up with Dr. Melvyn Novas.   Notification of test results are managed in the following manner: If there are  any recommendations or changes to the  plan of care discussed in office today,  we will contact you and let you know what they are. If you do not hear from [place], then your results are normal and you can view them through your  MyChart account , or a letter will be sent to you. Thank you again for trusting [place] with your care  - Thank you, [facility]    It is flu season:   >>> Best ways to protect herself from the flu: Receive the yearly flu vaccine, practice good hand hygiene washing  with soap and also using hand sanitizer when available, eat a nutritious meals, get adequate rest, hydrate appropriately       Please contact the office if your symptoms worsen or you have concerns that you are not improving.   Thank you for choosing [facility] for your healthcare, and for allowing [place] to partner with you on your healthcare journey. I am thankful to be able to provide care to you today.   Wyn Quaker FNP-C

## 2020-03-28 NOTE — Progress Notes (Signed)
@Patient  ID: Brooke [name], female    DOB: 1945/09/02, 74 y.o.   MRN: [id number]  Chief Complaint  Patient presents with  . Acute Visit    Feeling 10%-20% better, sob, cough-frothy white now,wheezing    Referring provider: [name], MD  HPI:  74 year old female former smoker followed in our office for COPD Gold stage IV  PMH: Hyperlipidemia, GERD, hoarseness Smoker/ Smoking History: Former smoker.  Quit 2009.  30-pack-year smoking history. Maintenance: Symbicort 160 Pt of: Dr. Melvyn Novas  03/28/2020  - Visit   74 year old female former smoker followed in our office for COPD Gold stage IV.  She is established with Dr. Melvyn Novas.  She was last seen in our office in September/2021.  Patient contacted our office today reporting that she was treated with a Z-Pak which she has now finished.  She is still having frothy white sputum.  Has hoarse voice, has more wheezing.  She is fully vaccinated COVID-19.  Triage evaluated the patient and felt that she was appropriate to be seen in office.  She was scheduled for the visit today.  Patient reports that she is taking the Z-Pak.  Her sputum color has improved from brown to white frothy sputum.  She still has intermittent wheezing.  She remains adherent to Symbicort 160.  She is also using her DuoNeb's twice daily.  She feels that the effects of the duo nebs wear off too quickly.  She does feel that the DuoNeb's help.  We will discuss this today.  Patient is up-to-date with COVID-19 vaccinations as well as even her flu vaccine.  Chest x-ray performed today does not show any acute cardiopulmonary abnormalities.  Patient remains established with oncology.  Questionaires / Pulmonary Flowsheets:   ACT:  No flowsheet data found.  MMRC: mMRC Dyspnea Scale mMRC Score  03/28/2020 3    Epworth:  No flowsheet data found.  Tests:   02/22/2020-CT chest with contrast-interval resolution of previously demonstrated left paratracheal and upper  abdominal adenopathy, stable treated metastasis within the spleen, stable radiation changes left lung and left hilar distortion, no evidence of local recurrence or progressive metastatic disease, aortic arthrosclerosis and emphysema  FENO:  No results found for: NITRICOXIDE  PFT: No flowsheet data found.  WALK:  SIX MIN WALK 06/13/2018  Supplimental Oxygen during Test? (L/min) No  Tech Comments: Patient was able to maintain her regular walking pace. Patient had to stop about three times to rest.    Imaging: DG Chest 2 View  Result Date: 03/28/2020 CLINICAL DATA:  COPD EXAM: CHEST - 2 VIEW COMPARISON:  Chest radiograph Sep 15, 2019; chest CT February 22, 2020 FINDINGS: There is scarring with volume loss and retraction left apex, stable. The lungs elsewhere are clear. Heart size and pulmonary vascularity are normal. No adenopathy. No bone lesions. IMPRESSION: Retraction with volume loss and cicatrization left apex, likely due in large part to previous radiation therapy. No edema or airspace opacity. Stable cardiac silhouette. No adenopathy appreciable. Electronically Signed   By: Lowella Grip III M.D.   On: 03/28/2020 12:31    Lab Results:  CBC    Component Value Date/Time   WBC 4.5 03/18/2020 0915   WBC 5.2 04/02/2017 1026   WBC 9.1 11/01/2016 0348   RBC 4.75 03/18/2020 0915   HGB 13.2 03/18/2020 0915   HGB 12.5 04/02/2017 1026   HCT 41.1 03/18/2020 0915   HCT 39.0 04/02/2017 1026   PLT 185 03/18/2020 0915   PLT 225 04/02/2017 1026  MCV 86.5 03/18/2020 0915   MCV 80.7 04/02/2017 1026   MCH 27.8 03/18/2020 0915   MCHC 32.1 03/18/2020 0915   RDW 16.2 (H) 03/18/2020 0915   RDW 17.3 (H) 04/02/2017 1026   LYMPHSABS 1.1 03/18/2020 0915   LYMPHSABS 0.8 (L) 04/02/2017 1026   MONOABS 0.4 03/18/2020 0915   MONOABS 0.4 04/02/2017 1026   EOSABS 0.2 03/18/2020 0915   EOSABS 0.1 04/02/2017 1026   BASOSABS 0.0 03/18/2020 0915   BASOSABS 0.0 04/02/2017 1026    BMET     Component Value Date/Time   NA 137 03/18/2020 0915   NA 139 04/02/2017 1026   K 4.3 03/18/2020 0915   K 4.1 04/02/2017 1026   CL 104 03/18/2020 0915   CO2 27 03/18/2020 0915   CO2 25 04/02/2017 1026   GLUCOSE 90 03/18/2020 0915   GLUCOSE 88 04/02/2017 1026   BUN 17 03/18/2020 0915   BUN 19.6 04/02/2017 1026   CREATININE 0.65 03/18/2020 0915   CREATININE 0.6 04/02/2017 1026   CALCIUM 9.3 03/18/2020 0915   CALCIUM 9.3 04/02/2017 1026   GFRNONAA >60 03/18/2020 0915   GFRAA >60 02/05/2020 0901    BNP    Component Value Date/Time   BNP 40.3 10/22/2016 1739    ProBNP    Component Value Date/Time   PROBNP 994.7 (H) 06/06/2013 0105    Specialty Problems      Pulmonary Problems   COPD  GOLD IV     Quit smoking 2009 Spirometry 02/15/2016  FEV1 0.60 (28%)  Ratio 38 with classic curvature  p am symbicort 160  - 01/08/2017    continue dulera 200 2bid  - 12/09/2017  After extensive coaching inhaler device  effectiveness =    90% with smi >add spiriva to  symbicort  - 12/12/2017 informed could not tol spiriva due to dry mouth - - 06/13/2018   continue symbicort 160 - 01/18/2020  After extensive coaching inhaler device,  effectiveness =  90%         Chronic respiratory failure with hypoxia (HCC)    rx 2lpm hs and prn daytime since 2014  -  06/13/2018   Walked on 2lpm x  3 laps @  approx 242ft each @ nl pace  stopped due to  Sob after each lap  s desat     rec as of 01/18/2020  = 2lpm hs and prn with activity with goal of keeping sats > 90%        Mass of upper lobe of left lung    See CT Chest 01/26/16 c/w ? Stage  lung ca arising in LUL - PET 02/28/16 1. Large lobular hypermetabolic mass in the LEFT upper lobe abutting the oblique fissure consists with primary bronchogenic carcinoma. 2. Evidence of ipsilateral hypermetabolic mediastinal nodal Metastasis only > 02/29/2016  referred for EBUS/ Dr [place] Bells > declined  -11/01/16   CT directed bx  Pos [place] LUL  > RT palliative completed  11/21/16 / rx per oncology = Keytruda  - CT 02/27/18 fibrosis of LUL / nodes smaller - CT 02/12/2019 Post treatment changes in left upper lobe unchanged from previous Exam. Continued decrease in size of splenic metastasis. Stable appearance of left paratracheal lymph nodes. Stable gastrohepatic adenopathy with incomplete visualization of retroperitoneal lymph nodes in the upper abdomen.      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 Codeine Nausea And Vomiting  . Imdur [Isosorbide Dinitrate]     headache  . Prednisone Other (See Comments)    Reaction:Abnormal behavior; cannot take in pill form but CAN tolerate the injection  . Pulmicort [Budesonide]     "feeling of torture"    Immunization History  Administered Date(s) Administered  . Influenza, High Dose Seasonal PF 02/08/2016  . Influenza,inj,Quad PF,6+ Mos 02/08/2016  . PFIZER SARS-COV-2 Vaccination 07/07/2019, 08/05/2019, 01/15/2020  . Pneumococcal Conjugate-13 02/05/2015  . Pneumococcal Polysaccharide-23 02/04/2013    Past Medical History:  Diagnosis Date  . Chronic respiratory failure (HCC)    a. on home O2.  Marland Kitchen COPD (chronic obstructive pulmonary disease) ([place])    a. Home O2.  . Former tobacco use   . GERD (gastroesophageal reflux disease)   . Hyperlipidemia   . Liver metastases ([place])   . lung ca 10/2016  . 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 NSTEMI (non-ST elevated myocardial infarction) ([place])    a. 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 Coronary vasospasm    Tobacco History: Social History   Tobacco Use  Smoking Status Former Smoker  . Packs/day: 1.00  . Years: 30.00  . Pack years: 30.00  . Types: Cigarettes  . Quit date: 05/08/2007  . Years  since quitting: 12.8  Smokeless Tobacco Never Used   Counseling given: Not Answered   Continue to not smoke  Outpatient Encounter Medications as of 03/28/2020  Medication Sig  . acetaminophen (TYLENOL) 325 MG tablet Per bottle as needed  . albuterol (PROVENTIL HFA;VENTOLIN HFA) 108 (90 BASE) MCG/ACT inhaler Inhale 2 puffs into the lungs every 4 (four) hours as needed for wheezing or shortness of breath. **PLAN B**  . ALPRAZolam (XANAX) 0.25 MG tablet 1/2-1 twice daily as needed  . atorvastatin (LIPITOR) 20 MG tablet TAKE 1 TABLET(20 MG) BY MOUTH DAILY AT 6 PM  . budesonide-formoterol (SYMBICORT) 160-4.5 MCG/ACT inhaler INHALE 2 PUFFS INTO THE LUNGS TWICE DAILY  . docusate sodium (COLACE) 100 MG capsule Take 100-200 mg by mouth daily.   Marland Kitchen guaiFENesin (MUCINEX) 600 MG 12 hr tablet Take 600 mg by mouth 2 (two) times daily as needed for cough or to loosen phlegm.   Marland Kitchen ibuprofen (ADVIL,MOTRIN) 600 MG tablet Take 600 mg by mouth every 6 (six) hours as needed.   Marland Kitchen ipratropium-albuterol (DUONEB) 0.5-2.5 (3) MG/3ML SOLN Take 3 mLs by nebulization every 4 (four) hours as needed (wheezing/shortness of breath). **PLAN C**  . nitroGLYCERIN (NITROSTAT) 0.4 MG SL tablet ONE TABLET UNDER TONGUE AS NEEDED FOR CHEST PAIN EVERY 5 MINUTES FOR 3 DOSES  . NON FORMULARY [facility]  Onychomycosis Nail Lacquer -  Fluconazole 2%, Terbinafine 1% DMSO/undecylenic acid 25% Apply to affected nail once daily Qty. 120 gm 3 refills  . OXYGEN 2lpm 24/7  . Probiotic Product (PROBIOTIC ADVANCED PO) Take 1 tablet by mouth daily.  . Simethicone (GAS RELIEF) 180 MG CAPS Use as needed   . azithromycin (ZITHROMAX) 250 MG tablet Take 1 tablet (250 mg total) by mouth as directed. (Patient not taking: Reported on 03/28/2020)  . Budeson-Glycopyrrol-Formoterol (BREZTRI AEROSPHERE) 160-9-4.8 MCG/ACT AERO Inhale 2 puffs into the lungs in the morning and at bedtime.  . [EXPIRED] methylPREDNISolone acetate (DEPO-MEDROL)  injection 120 mg    No facility-administered encounter medications on file as of 03/28/2020.  Review of Systems  Review of Systems  Constitutional: Positive for fatigue. Negative for activity change and fever.  HENT: Positive for congestion. Negative for sinus pressure, sinus pain and sore throat.   Respiratory: Positive for cough, shortness of breath and wheezing.   Cardiovascular: Negative for chest pain and palpitations.  Gastrointestinal: Negative for diarrhea, nausea and vomiting.  Musculoskeletal: Negative for arthralgias.  Neurological: Negative for dizziness.  Psychiatric/Behavioral: Negative for sleep disturbance. The patient is not nervous/anxious.      Physical Exam  BP 134/72 (BP Location: Left Arm, Cuff Size: Normal)   Pulse 74   Temp (!) 97.2 F (36.2 C) (Temporal)   Ht 5' 2.5" (1.588 m)   Wt 97 lb 12.8 oz (44.4 kg)   SpO2 100%   BMI 17.60 kg/m   Wt Readings from Last 5 Encounters:  03/28/20 97 lb 12.8 oz (44.4 kg)  03/18/20 95 lb 9.6 oz (43.4 kg)  02/26/20 95 lb 12.8 oz (43.5 kg)  02/05/20 92 lb 12.8 oz (42.1 kg)  01/22/20 94 lb (42.6 kg)    BMI Readings from Last 5 Encounters:  03/28/20 17.60 kg/m  03/18/20 17.49 kg/m  02/26/20 17.52 kg/m  02/05/20 16.97 kg/m  01/22/20 17.19 kg/m     Physical Exam Vitals and nursing note reviewed.  Constitutional:      General: She is not in acute distress.    Appearance: Normal appearance.     Comments: Thin frail elderly female  HENT:     Head: Normocephalic and atraumatic.     Right Ear: External ear normal.     Left Ear: External ear normal.     Nose: Rhinorrhea present. No congestion.     Mouth/Throat:     Mouth: Mucous membranes are moist.     Pharynx: Oropharynx is clear.     Comments: Postnasal drip Eyes:     Pupils: Pupils are equal, round, and reactive to light.  Cardiovascular:     Rate and Rhythm: Normal rate and regular rhythm.     Pulses: Normal pulses.     Heart sounds: Normal  heart sounds. No murmur heard.   Pulmonary:     Effort: Pulmonary effort is normal. No respiratory distress.     Breath sounds: No decreased air movement. Wheezing (Left upper lobe expiratory wheeze) present. No decreased breath sounds or rales.  Musculoskeletal:     Cervical back: Normal range of motion.  Skin:    General: Skin is warm and dry.     Capillary Refill: Capillary refill takes less than 2 seconds.  Neurological:     General: No focal deficit present.     Mental Status: She is alert and oriented to person, place, and time. Mental status is at baseline.     Gait: Gait normal.  Psychiatric:        Mood and Affect: Mood normal.        Behavior: Behavior normal.        Thought Content: Thought content normal.        Judgment: Judgment normal.       Assessment & Plan:   Chronic respiratory failure with hypoxia (HCC) Plan: Continue oxygen therapy Follow-up with our office in 6 weeks  COPD exacerbation ([place]) Slow to resolve COPD exacerbation Appropriately treated with a Z-Pak Chest x-ray today in office stable Expiratory wheeze  Plan: Depo-Medrol 120 today Trial of Breztri  Hold Symbicort 160 Continue oxygen therapy Follow-up with our office in 6 weeks  COPD  GOLD  IV  Plan: Depo-Medrol 120 today Trial of Breztri  Hold Symbicort 160 Continue oxygen therapy Follow-up with our office in 6 weeks  Malignant neoplasm of glottis ([place]) Plan: Continue follow-up with oncology  Malignant neoplasm of bronchus of left upper lobe ([place]) Plan: Continue follow-up with oncology  Hoarseness Plan: We will treat with Depo-Medrol 120 due to COPD exacerbation Would recommend ENT follow-up as well as oncology follow-up for ongoing chronic hoarseness  SOB (shortness of breath) Tory wheeze on exam today Slow to resolve COPD exacerbation Intolerant of prednisone p.o. tablets Able to tolerate Depo-Medrol injection  Plan: Depo-Medrol 120 today Trial of Breztri  Hold  Symbicort 160 Continue oxygen therapy Follow-up with our office in 6 weeks    Return in about 6 weeks (around 05/09/2020), or if symptoms worsen or fail to improve, for Follow up with Dr. Melvyn Novas.   Lauraine Rinne, NP 03/28/2020   This appointment required 32 minutes of patient care (this includes precharting, chart review, review of results, face-to-face care, etc.).

## 2020-03-28 NOTE — Telephone Encounter (Signed)
Spoke with pt and put her on to see BPM at noon today. Pt advised to arrive at 11:45 for checkin.

## 2020-03-28 NOTE — Assessment & Plan Note (Signed)
Plan: Depo-Medrol 120 today Trial of Breztri  Hold Symbicort 160 Continue oxygen therapy Follow-up with our office in 6 weeks

## 2020-03-28 NOTE — Assessment & Plan Note (Signed)
Plan: We will treat with Depo-Medrol 120 due to COPD exacerbation Would recommend ENT follow-up as well as oncology follow-up for ongoing chronic hoarseness

## 2020-04-03 ENCOUNTER — Other Ambulatory Visit: Payer: Self-pay | Admitting: Oncology

## 2020-04-08 ENCOUNTER — Inpatient Hospital Stay: Payer: Medicare Other | Attending: Oncology | Admitting: Nurse Practitioner

## 2020-04-08 ENCOUNTER — Inpatient Hospital Stay: Payer: Medicare Other

## 2020-04-08 ENCOUNTER — Telehealth: Payer: Self-pay | Admitting: Oncology

## 2020-04-08 ENCOUNTER — Encounter: Payer: Self-pay | Admitting: Nurse Practitioner

## 2020-04-08 ENCOUNTER — Other Ambulatory Visit: Payer: Self-pay

## 2020-04-08 VITALS — BP 131/85 | HR 77 | Temp 97.8°F | Resp 20 | Ht 62.5 in | Wt 95.7 lb

## 2020-04-08 DIAGNOSIS — C3412 Malignant neoplasm of upper lobe, left bronchus or lung: Secondary | ICD-10-CM | POA: Diagnosis not present

## 2020-04-08 DIAGNOSIS — Z9981 Dependence on supplemental oxygen: Secondary | ICD-10-CM | POA: Diagnosis not present

## 2020-04-08 DIAGNOSIS — R21 Rash and other nonspecific skin eruption: Secondary | ICD-10-CM | POA: Insufficient documentation

## 2020-04-08 DIAGNOSIS — J440 Chronic obstructive pulmonary disease with acute lower respiratory infection: Secondary | ICD-10-CM | POA: Insufficient documentation

## 2020-04-08 DIAGNOSIS — Z79899 Other long term (current) drug therapy: Secondary | ICD-10-CM | POA: Diagnosis not present

## 2020-04-08 DIAGNOSIS — I252 Old myocardial infarction: Secondary | ICD-10-CM | POA: Diagnosis not present

## 2020-04-08 DIAGNOSIS — Z5112 Encounter for antineoplastic immunotherapy: Secondary | ICD-10-CM | POA: Insufficient documentation

## 2020-04-08 DIAGNOSIS — C7889 Secondary malignant neoplasm of other digestive organs: Secondary | ICD-10-CM | POA: Insufficient documentation

## 2020-04-08 DIAGNOSIS — R490 Dysphonia: Secondary | ICD-10-CM | POA: Diagnosis not present

## 2020-04-08 LAB — CBC WITH DIFFERENTIAL (CANCER CENTER ONLY)
Abs Immature Granulocytes: 0.01 10*3/uL (ref 0.00–0.07)
Basophils Absolute: 0 10*3/uL (ref 0.0–0.1)
Basophils Relative: 1 %
Eosinophils Absolute: 0.1 10*3/uL (ref 0.0–0.5)
Eosinophils Relative: 2 %
HCT: 40.8 % (ref 36.0–46.0)
Hemoglobin: 13.2 g/dL (ref 12.0–15.0)
Immature Granulocytes: 0 %
Lymphocytes Relative: 27 %
Lymphs Abs: 1.1 10*3/uL (ref 0.7–4.0)
MCH: 28.2 pg (ref 26.0–34.0)
MCHC: 32.4 g/dL (ref 30.0–36.0)
MCV: 87.2 fL (ref 80.0–100.0)
Monocytes Absolute: 0.3 10*3/uL (ref 0.1–1.0)
Monocytes Relative: 8 %
Neutro Abs: 2.6 10*3/uL (ref 1.7–7.7)
Neutrophils Relative %: 62 %
Platelet Count: 198 10*3/uL (ref 150–400)
RBC: 4.68 MIL/uL (ref 3.87–5.11)
RDW: 14.9 % (ref 11.5–15.5)
WBC Count: 4.2 10*3/uL (ref 4.0–10.5)
nRBC: 0 % (ref 0.0–0.2)

## 2020-04-08 LAB — CMP (CANCER CENTER ONLY)
ALT: 16 U/L (ref 0–44)
AST: 18 U/L (ref 15–41)
Albumin: 3.8 g/dL (ref 3.5–5.0)
Alkaline Phosphatase: 62 U/L (ref 38–126)
Anion gap: 9 (ref 5–15)
BUN: 16 mg/dL (ref 8–23)
CO2: 26 mmol/L (ref 22–32)
Calcium: 9.5 mg/dL (ref 8.9–10.3)
Chloride: 105 mmol/L (ref 98–111)
Creatinine: 0.63 mg/dL (ref 0.44–1.00)
GFR, Estimated: >60 mL/min (ref >60)
Glucose, Bld: 86 mg/dL (ref 70–99)
Potassium: 4.1 mmol/L (ref 3.5–5.1)
Sodium: 140 mmol/L (ref 135–145)
Total Bilirubin: 0.4 mg/dL (ref 0.3–1.2)
Total Protein: 7 g/dL (ref 6.5–8.1)

## 2020-04-08 LAB — TSH: TSH: 0.844 u[IU]/mL (ref 0.308–3.960)

## 2020-04-08 MED ORDER — SODIUM CHLORIDE 0.9 % IV SOLN
Freq: Once | INTRAVENOUS | Status: AC
  Filled 2020-04-08: qty 250

## 2020-04-08 MED ORDER — SODIUM CHLORIDE 0.9 % IV SOLN
200.0000 mg | Freq: Once | INTRAVENOUS | Status: AC
  Administered 2020-04-08: 200 mg via INTRAVENOUS
  Filled 2020-04-08: qty 8

## 2020-04-08 NOTE — Telephone Encounter (Signed)
Scheduled appointments per 12/3 los. Spoke to patient who is aware of appointments date and time.

## 2020-04-08 NOTE — Patient Instructions (Signed)
[facility] Discharge Instructions for Patients Receiving Chemotherapy  Today you received the following immunotherapy agent: Pembrolizumab [name]  To help prevent nausea and vomiting after your treatment, we encourage you to take your nausea medication as directed by your MD.   If you develop nausea and vomiting that is not controlled by your nausea medication, call the clinic.   BELOW ARE SYMPTOMS THAT SHOULD BE REPORTED IMMEDIATELY:  *FEVER GREATER THAN 100.5 F  *CHILLS WITH OR WITHOUT FEVER  NAUSEA AND VOMITING THAT IS NOT CONTROLLED WITH YOUR NAUSEA MEDICATION  *UNUSUAL SHORTNESS OF BREATH  *UNUSUAL BRUISING OR BLEEDING  TENDERNESS IN MOUTH AND THROAT WITH OR WITHOUT PRESENCE OF ULCERS  *URINARY PROBLEMS  *BOWEL PROBLEMS  UNUSUAL RASH Items with * indicate a potential emergency and should be followed up as soon as possible.  Feel free to call the clinic should you have any questions or concerns. The clinic phone number is [phone].  Please show the [facility] at check-in to the Emergency Department and triage nurse.

## 2020-04-08 NOTE — Progress Notes (Signed)
Patient stable upon leaving infusion room.

## 2020-04-08 NOTE — Progress Notes (Addendum)
[facility] OFFICE PROGRESS NOTE   Diagnosis: Non-small cell lung cancer  INTERVAL HISTORY:   Brooke [name] returns as scheduled. She completed another cycle of Pembrolizumab 03/18/2020.  No rash or diarrhea.  Pruritus intermittently various areas.  No associated rash.  No nausea or vomiting.  She received Depo-Medrol 03/28/2020.  The wheezing has improved.  Baseline dyspnea unchanged.  She notes increased hoarseness.  Objective:  Vital signs in last 24 hours:  Blood pressure 131/85, pulse 77, temperature 97.8 F (36.6 C), temperature source Tympanic, resp. rate 20, height 5' 2.5" (1.588 m), weight 95 lb 11.2 oz (43.4 kg), SpO2 100 %.    HEENT: No thrush or ulcers. Resp: Lungs clear bilaterally. Cardio: Regular rate and rhythm. GI: Abdomen soft and nontender.  No hepatomegaly. Vascular: No leg edema.  Calves soft and nontender.  Skin: No rash.   Lab Results:  Lab Results  Component Value Date   WBC 4.2 04/08/2020   HGB 13.2 04/08/2020   HCT 40.8 04/08/2020   MCV 87.2 04/08/2020   PLT 198 04/08/2020   NEUTROABS 2.6 04/08/2020    Imaging:  No results found.  Medications: I have reviewed the patient's current medications.  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  PDL1 60%  Left lung radiation 11/08/2016 through 11/21/2016  CT chest 12/12/2016-reexpansion of left upper lobe with a decreased left upper lobe mass, unchanged mediastinal adenopathy, progression of a splenic metastasis/pancreatic tail/gastrohepatic ligament metastasis,  right lower lobe pneumonia  Cycle 1 Pembrolizumab 12/14/2016  Cycle 2 Pembrolizumab 01/03/2017  Cycle 3 Pembrolizumab 01/24/2017  Cycle 4 Pembrolizumab 02/12/2017  CT 03/05/2018-decrease in left upper lobe mass, mediastinal adenopathy, and splenic mass  Cycle 5 pembrolizumab 03/07/2017  Cycle 6 Pembrolizumab 04/02/2017  Cycle 7 pembrolizumab 04/25/2017  Cycle 8 Pembrolizumab 05/14/2017  Cycle 9 Pembrolizumab 06/06/2017  CT 06/20/2017-mild decrease in left upper lobe mass, mild increase in paramediastinal left upper lobe nodule-radiation change?, decreased splenic metastasis  Cycle 10 pembrolizumab 06/25/2017  Cycle 11 pembrolizumab 07/18/2017  Cycle 12 pembrolizumab 08/29/2017  Cycle 13 pembrolizumab 09/17/2017  Cycle 14 Pembrolizumab 10/10/2017  CT chest 10/24/2017-slight enlargement of small mediastinal lymph nodes, increased left upper lobe consolidation, decreased size of spleen lesion  Cycle 15 pembrolizumab 10/29/2017  Cycle 16 Pembrolizumab 11/21/2017  Cycle 17 Pembrolizumab 12/11/2017  Cycle 18 pembrolizumab 01/02/2018  Cycle 19 Pembrolizumab 01/21/2018  Cycle 20 Pembrolizumab 02/13/2018  CT chest 02/27/2018-decreased left paratracheal node, progressive consolidation/fibrosis in the left upper lung, decreased size of splenic mass  Cycle 21 pembrolizumab 03/04/2018  Cycle 22 Pembrolizumab 03/27/2018  Cycle 23 pembrolizumab 04/15/2018  Cycle 24 pembrolizumab 05/08/2018  Cycle 25 Pembrolizumab 05/27/2018  CT chest 06/16/2018-stable left upper lung radiation fibrosis with no evidence of local tumor recurrence. Decreased left paratracheal adenopathy. No new or progressive metastatic disease in the chest. Gastrohepatic ligament lymphadenopathy stable. Splenic metastasis decreased. T5 vertebral compression fracture with associated patchy sclerosis and no discrete osseous lesion, new in the interval.  Cycle 26 Pembrolizumab 06/19/2018  Cycle 27 pembrolizumab  07/08/2018  Cycle 28 pembrolizumab 07/31/2018  Cycle 29 pembrolizumab 08/19/2018  Cycle 30 Pembrolizumab 09/11/2018  Cycle 31 pembrolizumab 09/30/2018  Cycle 32 pembrolizumab 10/23/2018  CT chest 10/28/2018-left apical pleural-parenchymal opacity consistent with radiation scarring has become more confluent likely representing evolutionary change. Continued further decrease in size of index left paratracheal node and splenic metastasis. Gastrohepatic ligament lymph node not changed.  Cycle 33 Pembrolizumab 11/11/2018  Cycle 34 pembrolizumab 12/04/2018  Cycle 35 pembrolizumab 12/23/2018  CT chest 02/12/2019-posttreatment changes left upper lobe unchanged. Continued decrease in size of splenic metastasis. Stable appearance of left paratracheal lymph nodes. Stable gastrohepatic adenopathy. Incomplete visualization of retroperitoneal lymph nodes in the upper abdomen.  CT chest 07/02/2019-stable post treatment related changes left lung. 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 Slight regression of enlarged likely metastatic gastrohepatic lymph node.  CT chest 11/11/2019-interval enlargement of a right paratracheal lymph node measuring 14 mm, increased from 5 mm. No new lung mass or nodularity. New adenopathy in the upper abdomen. 2.7 cm lesion gastrohepatic ligament. Necrotic node at the splenic hilum measures 4.7 cm. Large node along the IVC left upper quadrant measures 2.7 cm. Similar node left adjacent the aorta measures 1.8 cm. Enhancing lesion in the spleen is unchanged. Several low-density lesions in the liver are unchanged.  Pembrolizumab resumed on a 3-week schedule 12/03/2019  CT chest 02/22/2020-interval resolution of previously demonstrated left paratracheal and upper abdominal adenopathy. Stable treated metastasis within the spleen. Stable radiation changes in the left lung with left hilar distortion. No evidence of local recurrence or progressive metastatic  disease.  Continuation of pembrolizumab every 3 weeks 02/26/2020  2. 05/29/2019 laryngoscopy-exophytic appearing mass mid right vocal cord.   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 Tumor cells negative for p16, CMV and HSV 2. 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   Radiation 07/29/2019-08/25/2019  3. History of acute respiratory failure secondary to #1  4. Oxygen dependent COPD  5. History of a NSTEMI 2015  6. Right lung pneumonia on chest CT 12/12/2016-treated with Levaquin  7.Grade 2 rash possibly related to Pembrolizumab. Treatment held 08/06/2017.Improved 08/29/2017, treatment resumed, rash has resolved  Disposition: Ms. Nardone appears unchanged.  No clinical evidence of disease progression.  Plan to continue pembrolizumab every 3 weeks.  We reviewed the CBC and chemistry panel from today.  Labs adequate to proceed with treatment.  She will follow up with ENT regarding increased hoarseness.  She will return for lab, follow-up, Pembrolizumab in 3 weeks.   Patient seen with Dr. Luberta Robertson ANP/GNP-BC   04/08/2020  9:35 AM This was a shared visit with [name].  Ms. Valley appears stable.  She is tolerating the pembrolizumab well.  The restaging CT in October was consistent with a response to therapy.  The plan is to continue pembrolizumab.  She will follow up with Dr. Isidore Moos and ENT for evaluation of hoarseness.  Julieanne Manson, MD

## 2020-04-21 NOTE — Progress Notes (Signed)
Brooke [name] presents for follow-up after completing radiation to the larynx on 08/25/2019 (also completed radiation to her left lung on 11/21/2016)  Pain issues, if any: Patient denies Using a feeding tube?: N/A Weight changes, if any: Reports her appetite has returned Wt Readings from Last 3 Encounters:  04/22/20 98 lb 6 oz (44.6 kg)  04/08/20 95 lb 11.2 oz (43.4 kg)  03/28/20 97 lb 12.8 oz (44.4 kg)   Swallowing issues, if any: Occasionally gets choked up if she eat or drinks too fast. Otherwise she feels like she can eat a wide variety of food. Smoking or chewing tobacco? None Using fluoride trays daily? N/A Last ENT visit was on: 02/01/2020 Saw Dr. Lind Guest: "No evidence of disease status post completion of radiation therapy for lesion of the right vocal cord. The left vocal fold did demonstrate some hypomobility today. Patient last scan done in July and has upcoming CT chest in the next couple of weeks. We will follow results. I will plan on seeing her back every 3 months." Cough/SOB/O2 use: Denies any productive coughing. Wears supplemental O2 with activity. Does deal with sinus drainage. Still gets SOB with minimal activity, but she feels her fatigue has improved for her to be able to be more active   Other notable issues, if any: Denies any issues with lymphedema to jaw or neck. She is very diligent about doing SLP exercises, so she denies any ear or jaw pain, or difficulty opening her mouth. Continues to deal with vocal hoarseness. Reports improvement in dry mouth and thickened saliva. Continues to receive pembrolizumab every 3 weeks under care of Dr. Dominica Severin B. Sherrill. Has F/U with pulmonologist in 05/2020.  Vitals:   04/22/20 1119  BP: (!) 162/85  Pulse: 77  Resp: 20  Temp: (!) 97 F (36.1 C)  SpO2: 100%

## 2020-04-22 ENCOUNTER — Other Ambulatory Visit: Payer: Self-pay

## 2020-04-22 ENCOUNTER — Ambulatory Visit
Admission: RE | Admit: 2020-04-22 | Discharge: 2020-04-22 | Disposition: A | Discharge status: Home / Self Care | Payer: Medicare Other | Source: Ambulatory Visit | Attending: Radiation Oncology | Admitting: Radiation Oncology

## 2020-04-22 ENCOUNTER — Encounter: Payer: Self-pay | Admitting: Radiation Oncology

## 2020-04-22 VITALS — BP 162/85 | HR 77 | Temp 97.0°F | Resp 20 | Ht 62.5 in | Wt 98.4 lb

## 2020-04-22 DIAGNOSIS — Z85819 Personal history of malignant neoplasm of unspecified site of lip, oral cavity, and pharynx: Secondary | ICD-10-CM | POA: Diagnosis not present

## 2020-04-22 DIAGNOSIS — Z79899 Other long term (current) drug therapy: Secondary | ICD-10-CM | POA: Diagnosis not present

## 2020-04-22 DIAGNOSIS — C320 Malignant neoplasm of glottis: Secondary | ICD-10-CM

## 2020-04-22 MED ORDER — LARYNGOSCOPY SOLUTION RAD-ONC
15.0000 mL | Freq: Once | TOPICAL | Status: AC
  Administered 2020-04-22: 12:00:00 15 mL via TOPICAL
  Filled 2020-04-22: qty 15

## 2020-04-22 NOTE — Progress Notes (Signed)
Radiation Oncology         [phone] ________________________________  Name: Brooke [name] MRN: [id number]  Date: 04/22/2020  DOB: 10-15-1945  Follow-Up Visit Note  CC: Brooke Sacramento, MD  Brooke Seminole, MD  Diagnosis and Prior Radiotherapy:       ICD-10-CM   1. 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[facility]) Staging form: Larynx - Glottis, AJCC 8th Edition - Clinical stage from 07/14/2019: Stage 0 (cTis, cN0, cM0) - Signed by Eppie Gibson, MD on 07/14/2019   CHIEF COMPLAINT:  Here for follow-up and surveillance of glottic disease  Narrative:   Brooke [name] presents for follow-up after completing radiation to the larynx on 08/25/2019 (also completed radiation to her left lung on 11/21/2016)  Pain issues, if any: Patient denies Using a feeding tube?: N/A Weight changes, if any: Reports her appetite has returned Wt Readings from Last 3 Encounters:  04/22/20 98 lb 6 oz (44.6 kg)  04/08/20 95 lb 11.2 oz (43.4 kg)  03/28/20 97 lb 12.8 oz (44.4 kg)   Swallowing issues, if any: Occasionally gets choked up if she eat or drinks too fast. Otherwise she feels like she can eat a wide variety of food. Smoking or chewing tobacco? None Using fluoride trays daily? N/A Last ENT visit was on: 02/01/2020 Saw Dr. Lind [name]: "No evidence of disease status post completion of radiation therapy for lesion of the right vocal cord. The left vocal fold did demonstrate some hypomobility today. Patient last scan done in July and has upcoming CT chest in the next couple of weeks. We will follow results. I will plan on seeing her back every 3 months." Cough/SOB/O2 use: Denies any productive coughing. Wears supplemental O2 with activity. Does deal with sinus drainage. Still gets SOB with minimal activity, but she feels her fatigue has improved for her to be able to be more active   Other notable issues, if any: Denies any issues with  lymphedema to jaw or neck. She is very diligent about doing SLP exercises, so she denies any ear or jaw pain, or difficulty opening her mouth. Continues to deal with vocal hoarseness. Reports improvement in dry mouth and thickened saliva. Continues to receive pembrolizumab every 3 weeks under care of Dr. Dominica Severin B. [name]. Has F/U with pulmonologist in 05/2020.  Vitals:   04/22/20 1119  BP: (!) 162/85  Pulse: 77  Resp: 20  Temp: (!) 97 F (36.1 C)  SpO2: 100%            ALLERGIES:  is allergic to codeine, imdur [isosorbide dinitrate], prednisone, and pulmicort [budesonide].  Meds: Current Outpatient Medications  Medication Sig Dispense Refill  . acetaminophen (TYLENOL) 325 MG tablet Per bottle as needed    . albuterol (PROVENTIL HFA;VENTOLIN HFA) 108 (90 BASE) MCG/ACT inhaler Inhale 2 puffs into the lungs every 4 (four) hours as needed for wheezing or shortness of breath. **PLAN B**    . ALPRAZolam (XANAX) 0.25 MG tablet 1/2-1 twice daily as needed  1  . atorvastatin (LIPITOR) 20 MG tablet TAKE 1 TABLET(20 MG) BY MOUTH DAILY AT 6 PM 90 tablet 1  . Budeson-Glycopyrrol-Formoterol (BREZTRI AEROSPHERE) 160-9-4.8 MCG/ACT AERO Inhale 2 puffs into the lungs in the morning and at bedtime. (Patient not taking: Reported on 04/08/2020) 5.9 g 0  . budesonide-formoterol (SYMBICORT) 160-4.5 MCG/ACT inhaler INHALE 2 PUFFS INTO THE LUNGS TWICE DAILY    . docusate sodium (COLACE) 100 MG capsule Take  100-200 mg by mouth daily.  (Patient not taking: Reported on 04/08/2020)    . guaiFENesin (MUCINEX) 600 MG 12 hr tablet Take 600 mg by mouth 2 (two) times daily as needed for cough or to loosen phlegm.     Marland Kitchen ibuprofen (ADVIL,MOTRIN) 600 MG tablet Take 600 mg by mouth every 6 (six) hours as needed.  (Patient not taking: Reported on 04/08/2020)    . ipratropium-albuterol (DUONEB) 0.5-2.5 (3) MG/3ML SOLN Take 3 mLs by nebulization every 4 (four) hours as needed (wheezing/shortness of breath). **PLAN C**    .  nitroGLYCERIN (NITROSTAT) 0.4 MG SL tablet ONE TABLET UNDER TONGUE AS NEEDED FOR CHEST PAIN EVERY 5 MINUTES FOR 3 DOSES (Patient not taking: Reported on 04/08/2020) 25 tablet 3  . NON FORMULARY [facility]  Onychomycosis Nail Lacquer -  Fluconazole 2%, Terbinafine 1% DMSO/undecylenic acid 25% Apply to affected nail once daily Qty. 120 gm 3 refills (Patient not taking: Reported on 04/08/2020)    . OXYGEN 2lpm 24/7    . Probiotic Product (PROBIOTIC ADVANCED PO) Take 1 tablet by mouth daily.    . Simethicone (GAS RELIEF) 180 MG CAPS Use as needed      No current facility-administered medications for this encounter.    Physical Findings: The patient is in no acute distress. Patient is alert and oriented. Wt Readings from Last 3 Encounters:  04/22/20 98 lb 6 oz (44.6 kg)  04/08/20 95 lb 11.2 oz (43.4 kg)  03/28/20 97 lb 12.8 oz (44.4 kg)    height is 5' 2.5" (1.588 m) and weight is 98 lb 6 oz (44.6 kg). Her temporal temperature is 97 F (36.1 C) (abnormal). Her blood pressure is 162/85 (abnormal) and her pulse is 77. Her respiration is 20 and oxygen saturation is 100%. .  General: Alert and oriented, in no acute distress; hoarse HEENT: Head is normocephalic. Extraocular movements are intact. Oropharynx / mouth are notable for no lesions Neck is notable for lymphedema in anterior neck, no masses; skin intact Skin: Skin in treatment fields shows satisfactory healing  Chest: decrease breath sounds b/l.  No wheezes  Heart RRR Psychiatric: Judgment and insight are intact. Affect is appropriate.  PROCEDURE NOTE: After obtaining consent and anesthetizing the nasal cavity with topical lidocaine and phenylephrine, the flexible endoscope was introduced and passed through the nasal cavity.  The nasopharynx, oropharynx, larynx, and hypopharynx were then examined. No lesions throughout throat, nor on true cords. True cords symmetrically mobile.   Lab Findings: Lab Results  Component Value Date    WBC 4.2 04/08/2020   HGB 13.2 04/08/2020   HCT 40.8 04/08/2020   MCV 87.2 04/08/2020   PLT 198 04/08/2020    Lab Results  Component Value Date   TSH 0.844 04/08/2020    Radiographic Findings: DG Chest 2 View  Result Date: 03/28/2020 CLINICAL DATA:  COPD EXAM: CHEST - 2 VIEW COMPARISON:  Chest radiograph Sep 15, 2019; chest CT February 22, 2020 FINDINGS: There is scarring with volume loss and retraction left apex, stable. The lungs elsewhere are clear. Heart size and pulmonary vascularity are normal. No adenopathy. No bone lesions. IMPRESSION: Retraction with volume loss and cicatrization left apex, likely due in large part to previous radiation therapy. No edema or airspace opacity. Stable cardiac silhouette. No adenopathy appreciable. Electronically Signed   By: Lowella Grip III M.D.   On: 03/28/2020 12:31    Impression/Plan:    1) Head and Neck Cancer Status: NED  2) Nutritional Status: Relatively stable -  continue to push intake as tolerated PEG tube: None  3) Risk Factors: The patient has been educated about risk factors including alcohol and tobacco abuse; they understand that avoidance of alcohol and tobacco is important to prevent recurrences as well as other cancers  4) Swallowing: Continue swallowing therapy  5)  Thyroid function: Check annually Lab Results  Component Value Date   TSH 0.844 04/08/2020    6)  Other: moderate elevation in BP - patient will discuss with her cardiologist.   7)  Follow-up here in 37mo.     8) We discussed measures to reduce the risk of infection during the COVID-19 pandemic.  She has been "boosted."  On date of service, in total, I spent 25 minutes on this encounter.  Patient seen in person. _____________________________________   Eppie Gibson, MD

## 2020-04-24 ENCOUNTER — Other Ambulatory Visit: Payer: Self-pay | Admitting: Oncology

## 2020-04-25 ENCOUNTER — Ambulatory Visit (INDEPENDENT_AMBULATORY_CARE_PROVIDER_SITE_OTHER): Payer: Medicare Other | Admitting: Physician Assistant

## 2020-04-25 ENCOUNTER — Encounter: Payer: Self-pay | Admitting: Physician Assistant

## 2020-04-25 ENCOUNTER — Other Ambulatory Visit: Payer: Self-pay

## 2020-04-25 VITALS — BP 145/83 | HR 73 | Ht 62.0 in | Wt 98.4 lb

## 2020-04-25 DIAGNOSIS — E785 Hyperlipidemia, unspecified: Secondary | ICD-10-CM | POA: Diagnosis not present

## 2020-04-25 DIAGNOSIS — R030 Elevated blood-pressure reading, without diagnosis of hypertension: Secondary | ICD-10-CM

## 2020-04-25 DIAGNOSIS — J449 Chronic obstructive pulmonary disease, unspecified: Secondary | ICD-10-CM | POA: Diagnosis not present

## 2020-04-25 DIAGNOSIS — C3490 Malignant neoplasm of unspecified part of unspecified bronchus or lung: Secondary | ICD-10-CM

## 2020-04-25 DIAGNOSIS — I2510 Atherosclerotic heart disease of native coronary artery without angina pectoris: Secondary | ICD-10-CM

## 2020-04-25 NOTE — Patient Instructions (Signed)
Medication Instructions:  No changes *If you need a refill on your cardiac medications before your next appointment, please call your pharmacy*   Lab Work: No Labs If you have labs (blood work) drawn today and your tests are completely normal, you will receive your results only by:  [place] (if you have MyChart) OR  A paper copy in the mail If you have any lab test that is abnormal or we need to change your treatment, we will call you to review the results.   Testing/Procedures: No Testing    Follow-Up: At [facility], you and your health needs are our priority.  As part of our continuing mission to provide you with exceptional heart care, we have created designated Provider Care Teams.  These Care Teams include your primary Cardiologist (physician) and Advanced Practice Providers (APPs -  Physician Assistants and Nurse Practitioners) who all work together to provide you with the care you need, when you need it.    Your next appointment:   1 year(s)  The format for your next appointment:   In Person  Provider:   Skeet Latch, MD

## 2020-04-25 NOTE — Progress Notes (Signed)
Cardiology Office Note:    Date:  04/25/2020   ID:  Brooke [name], DOB 08/19/1945, MRN [id number]  PCP:  Brooke Sacramento, MD  [facility] Cardiologist: plan to set up with Brooke Latch, MD, previously Dr. Zandra Abts [facility] Electrophysiologist:  None   Referring MD: Brooke Sacramento, MD   Chief Complaint  Patient presents with  . Follow-up    Blood pressure follow up    History of Present Illness:    Brooke [name] is a 74 y.o. female with a hx of COPD on home O2, minimal CAD on cath 06/2013, GERD, hyperlipidemia, stage IV lung cancerand former tobacco use. 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 It was possible that she had coronary vasospasm. The event leading up to the cardiac cath was that she lost her car keys while shopping and got into a panic episode.She has a history of leg cramps if she take Lipitor on a daily basis, therefore she take Lipitor every other day instead. She is on amlodipine for possible coronary spasm. She was diagnosed with left upper lobe lung mass, PET scan in October 2017 showed hypermetabolic left upper lobe mass. She was referred for bronchoscopy in November 2017, however she declined treatment at the time. CT of the chest in June 2018 showed enlarging left upper lobe mass with new splenic metastasis, new adenopathy at the pancreatic tail, upper abdomen and mid mediastinum. 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 She has completed 35 cycles of chemotherapy with pembrolizumab.  Last cycle of chemotherapy was in August 2020.  Unfortunately, CT scan  obtained in October 2020 showed no significant change in the left upper lobe mass, however there was decreased size in the splenic metastasis. She had a repeat CT of the chest in July 2021 that showed interval enlargement of right paratracheal lymph node, however no new lung mass.  Due to the enlarged lymph node, she has been restarted on the chemotherapy.  I last saw the patient in September 2021 due to elevated blood pressure.  At that time, her blood pressure was in the low 140s range.  I recommended continued observation.  Since then, her blood pressure has been variable depending on her anxiety issue.  She says when she is anxious her blood pressure tends to be high.  During the recent visit with oncology service, her blood pressure has been in the low 130s range.  At this time I do not recommend restarting the amlodipine.  Her only cardiac medication at the Lipitor and as needed dose of nitroglycerin.  She has no lower extremity edema, she does not have any recent chest pain or worsening shortness of breath.  She can follow-up in 1 year.   Past Medical History:  Diagnosis Date  . Chronic respiratory failure (HCC)    a. on home O2.  Marland Kitchen COPD (chronic obstructive pulmonary disease) ([place])    a. Home O2.  . Former tobacco use   . GERD (gastroesophageal reflux disease)   . Hyperlipidemia   . Liver metastases ([place])   . lung ca 10/2016  . 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  NSTEMI (non-ST elevated myocardial infarction) ([place])    a. 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 Coronary vasospasm    Past Surgical History:  Procedure Laterality Date  . LEFT HEART CATHETERIZATION WITH CORONARY ANGIOGRAM N/A 06/08/2013   Procedure: LEFT HEART CATHETERIZATION WITH CORONARY ANGIOGRAM;  Surgeon: Sanda Klein, MD;  Location: [place] CATH LAB;  Service: Cardiovascular;  Laterality: N/A;    Current Medications: No outpatient medications have been marked  as taking for the 04/25/20 encounter (Office Visit) with [name], [place].     Allergies:   Codeine, Imdur [isosorbide dinitrate], Prednisone, and Pulmicort [budesonide]   Social History   Socioeconomic History  . Marital status: Divorced    Spouse name: Not on file  . Number of children: Not on file  . Years of education: Not on file  . Highest education level: Not on file  Occupational History  . Not on file  Tobacco Use  . Smoking status: Former Smoker    Packs/day: 1.00    Years: 30.00    Pack years: 30.00    Types: Cigarettes    Quit date: 05/08/2007    Years since quitting: 12.9  . Smokeless tobacco: Never Used  Vaping Use  . Vaping Use: Never used  Substance and Sexual Activity  . Alcohol use: Yes    Comment: rarely  . Drug use: No  . Sexual activity: Not on file  Other Topics Concern  . Not on file  Social History Narrative  . Not on file   Social Determinants of Health   Financial Resource Strain: Not on file  Food Insecurity: Not on file  Transportation Needs: Not on file  Physical Activity: Not on file  Stress: Not on file  Social Connections: Not on file     Family History: The patient's family history includes Lung cancer in her father. There is no history of Heart attack, Stroke, or Hypertension.  ROS:   Please see the history of present illness.     All other systems reviewed and are negative.  EKGs/Labs/Other Studies Reviewed:    The following studies were reviewed today:  Echo 06/06/2013 LV EF: 50% -  55%  Study Conclusions   Left ventricle: The cavity size was normal. Systolic  function was normal. The estimated ejection fraction was in  the range of 50% to 55%. Regional wall motion abnormalities  cannot be excluded. Doppler parameters are consistent with  abnormal left ventricular relaxation (grade 1 diastolic  dysfunction).     EKG:  EKG is not ordered today.   Recent Labs: 04/08/2020: ALT 16; BUN 16; Creatinine 0.63; Hemoglobin  13.2; Platelet Count 198; Potassium 4.1; Sodium 140; TSH 0.844  Recent Lipid Panel    Component Value Date/Time   CHOL 190 02/20/2019 0000   TRIG 45 02/20/2019 0000   HDL 87 02/20/2019 0000   CHOLHDL 2.2 02/20/2019 0000   CHOLHDL 2.2 08/18/2015 1041   VLDL 12 08/18/2015 1041   LDLCALC 94 02/20/2019 0000     Risk Assessment/Calculations:       Physical Exam:    VS:  BP (!) 145/83   Pulse 73   Ht 5\' 2"  (1.575 m)   Wt 98 lb 6.4 oz (44.6 kg)   SpO2 99%   BMI 18.00 kg/m     Wt Readings from Last 3 Encounters:  04/25/20 98 lb 6.4 oz (44.6 kg)  04/22/20 98 lb 6 oz (44.6 kg)  04/08/20 95  lb 11.2 oz (43.4 kg)     GEN:  Well nourished, well developed in no acute distress HEENT: Normal NECK: No JVD; No carotid bruits LYMPHATICS: No lymphadenopathy CARDIAC: RRR, no murmurs, rubs, gallops RESPIRATORY:  Clear to auscultation without rales, wheezing or rhonchi  ABDOMEN: Soft, non-tender, non-distended MUSCULOSKELETAL:  No edema; No deformity  SKIN: Warm and dry NEUROLOGIC:  Alert and oriented x 3 PSYCHIATRIC:  Normal affect   ASSESSMENT:    1. Elevated blood pressure reading   2. Coronary artery disease involving native coronary artery of native heart without angina pectoris   3. Chronic obstructive pulmonary disease, unspecified COPD type ([place])   4. Hyperlipidemia LDL goal <70   5. Malignant neoplasm of lung, unspecified laterality, unspecified part of lung ([place])    PLAN:    In order of problems listed above:  1. Elevated blood pressure: Recent elevated blood pressure is related to anxiety issue.  At this time, I do not recommend any further medication adjustment.  2. CAD: Denies any recent anginal symptom  3. COPD: On oxygen at home  4. Hyperlipidemia: On Lipitor  5. Stage IV lung cancer: Followed by oncology service.   Medication Adjustments/Labs and Tests Ordered: Current medicines are reviewed at length with the patient today.  Concerns regarding medicines  are outlined above.  No orders of the defined types were placed in this encounter.  No orders of the defined types were placed in this encounter.   Patient Instructions  Medication Instructions:  No changes *If you need a refill on your cardiac medications before your next appointment, please call your pharmacy*   Lab Work: No Labs If you have labs (blood work) drawn today and your tests are completely normal, you will receive your results only by: [place] MyChart Message (if you have MyChart) OR . A paper copy in the mail If you have any lab test that is abnormal or we need to change your treatment, we will call you to review the results.   Testing/Procedures: No Testing    Follow-Up: At [facility], you and your health needs are our priority.  As part of our continuing mission to provide you with exceptional heart care, we have created designated Provider Care Teams.  These Care Teams include your primary Cardiologist (physician) and Advanced Practice Providers (APPs -  Physician Assistants and Nurse Practitioners) who all work together to provide you with the care you need, when you need it.    Your next appointment:   1 year(s)  The format for your next appointment:   In Person  Provider:   Skeet Latch, MD       Signed, [name], [place]  04/25/2020 10:20 AM    [place]

## 2020-04-28 ENCOUNTER — Telehealth: Payer: Self-pay | Admitting: Oncology

## 2020-04-28 ENCOUNTER — Inpatient Hospital Stay: Payer: Medicare Other

## 2020-04-28 ENCOUNTER — Inpatient Hospital Stay (HOSPITAL_BASED_OUTPATIENT_CLINIC_OR_DEPARTMENT_OTHER): Payer: Medicare Other | Admitting: Oncology

## 2020-04-28 ENCOUNTER — Other Ambulatory Visit: Payer: Self-pay

## 2020-04-28 VITALS — BP 179/81 | HR 76 | Temp 97.6°F | Resp 18 | Ht 62.0 in | Wt 99.6 lb

## 2020-04-28 DIAGNOSIS — C3412 Malignant neoplasm of upper lobe, left bronchus or lung: Secondary | ICD-10-CM | POA: Diagnosis not present

## 2020-04-28 DIAGNOSIS — Z5112 Encounter for antineoplastic immunotherapy: Secondary | ICD-10-CM

## 2020-04-28 LAB — CMP (CANCER CENTER ONLY)
ALT: 24 U/L (ref 0–44)
AST: 21 U/L (ref 15–41)
Albumin: 3.9 g/dL (ref 3.5–5.0)
Alkaline Phosphatase: 61 U/L (ref 38–126)
Anion gap: 7 (ref 5–15)
BUN: 17 mg/dL (ref 8–23)
CO2: 27 mmol/L (ref 22–32)
Calcium: 9.2 mg/dL (ref 8.9–10.3)
Chloride: 105 mmol/L (ref 98–111)
Creatinine: 0.66 mg/dL (ref 0.44–1.00)
GFR, Estimated: >60 mL/min (ref >60)
Glucose, Bld: 84 mg/dL (ref 70–99)
Potassium: 3.9 mmol/L (ref 3.5–5.1)
Sodium: 139 mmol/L (ref 135–145)
Total Bilirubin: 0.3 mg/dL (ref 0.3–1.2)
Total Protein: 7.1 g/dL (ref 6.5–8.1)

## 2020-04-28 LAB — CBC WITH DIFFERENTIAL (CANCER CENTER ONLY)
Abs Immature Granulocytes: 0.01 10*3/uL (ref 0.00–0.07)
Basophils Absolute: 0 10*3/uL (ref 0.0–0.1)
Basophils Relative: 1 %
Eosinophils Absolute: 0.1 10*3/uL (ref 0.0–0.5)
Eosinophils Relative: 3 %
HCT: 40.7 % (ref 36.0–46.0)
Hemoglobin: 13.1 g/dL (ref 12.0–15.0)
Immature Granulocytes: 0 %
Lymphocytes Relative: 31 %
Lymphs Abs: 1.2 10*3/uL (ref 0.7–4.0)
MCH: 28.7 pg (ref 26.0–34.0)
MCHC: 32.2 g/dL (ref 30.0–36.0)
MCV: 89.1 fL (ref 80.0–100.0)
Monocytes Absolute: 0.3 10*3/uL (ref 0.1–1.0)
Monocytes Relative: 9 %
Neutro Abs: 2.3 10*3/uL (ref 1.7–7.7)
Neutrophils Relative %: 56 %
Platelet Count: 195 10*3/uL (ref 150–400)
RBC: 4.57 MIL/uL (ref 3.87–5.11)
RDW: 14 % (ref 11.5–15.5)
WBC Count: 4 10*3/uL (ref 4.0–10.5)
nRBC: 0 % (ref 0.0–0.2)

## 2020-04-28 MED ORDER — SODIUM CHLORIDE 0.9 % IV SOLN
200.0000 mg | Freq: Once | INTRAVENOUS | Status: AC
  Administered 2020-04-28: 12:00:00 200 mg via INTRAVENOUS
  Filled 2020-04-28: qty 8

## 2020-04-28 MED ORDER — SODIUM CHLORIDE 0.9 % IV SOLN
Freq: Once | INTRAVENOUS | Status: AC
  Filled 2020-04-28: qty 250

## 2020-04-28 NOTE — Progress Notes (Signed)
Per [name]: Does not require labs for [facility] today.

## 2020-04-28 NOTE — Progress Notes (Signed)
Brooke [name] OFFICE PROGRESS NOTE   Diagnosis: Non-small cell lung cancer  INTERVAL HISTORY:   Brooke [name] completed another treatment with pembrolizumab on 04/08/2020.  No rash or diarrhea.  She feels well.  Good appetite.  Dyspnea remains improved following a Solu-Medrol injection last month.  She continues to have hoarseness.  The hoarseness waxes and wanes.  Objective:  Vital signs in last 24 hours:  Blood pressure (!) 179/81, pulse 76, temperature 97.6 F (36.4 C), temperature source Tympanic, resp. rate 18, height '5\' 2"'  (1.575 m), weight 99 lb 9.6 oz (45.2 kg), SpO2 99 %.    Lymphatics: No cervical or supraclavicular nodes Resp: Lungs clear bilaterally Cardio: Regular rate and rhythm GI: No hepatosplenomegaly Vascular: No leg edema  Skin: No rash   Lab Results:  Lab Results  Component Value Date   WBC 4.0 04/28/2020   HGB 13.1 04/28/2020   HCT 40.7 04/28/2020   MCV 89.1 04/28/2020   PLT 195 04/28/2020   NEUTROABS 2.3 04/28/2020    CMP  Lab Results  Component Value Date   NA 140 04/08/2020   K 4.1 04/08/2020   CL 105 04/08/2020   CO2 26 04/08/2020   GLUCOSE 86 04/08/2020   BUN 16 04/08/2020   CREATININE 0.63 04/08/2020   CALCIUM 9.5 04/08/2020   PROT 7.0 04/08/2020   ALBUMIN 3.8 04/08/2020   AST 18 04/08/2020   ALT 16 04/08/2020   ALKPHOS 62 04/08/2020   BILITOT 0.4 04/08/2020   GFRNONAA >60 04/08/2020   GFRAA >60 02/05/2020     Medications: I have reviewed the patient's current medications.   Assessment/Plan: 1. 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  PDL1 60%  Left lung radiation 11/08/2016 through 11/21/2016  CT chest 12/12/2016-reexpansion of left upper lobe with a decreased left upper lobe mass, unchanged mediastinal adenopathy, progression of a splenic metastasis/pancreatic tail/gastrohepatic ligament metastasis, right lower lobe pneumonia  Cycle 1 Pembrolizumab 12/14/2016  Cycle 2 Pembrolizumab 01/03/2017  Cycle 3 Pembrolizumab 01/24/2017  Cycle 4 Pembrolizumab 02/12/2017  CT 03/05/2018-decrease in left upper lobe mass, mediastinal adenopathy, and splenic mass  Cycle 5 pembrolizumab 03/07/2017  Cycle 6 Pembrolizumab 04/02/2017  Cycle 7 pembrolizumab 04/25/2017  Cycle 8 Pembrolizumab 05/14/2017  Cycle 9 Pembrolizumab 06/06/2017  CT 06/20/2017-mild decrease in left upper lobe mass, mild increase in paramediastinal left upper lobe nodule-radiation change?, decreased splenic metastasis  Cycle 10 pembrolizumab 06/25/2017  Cycle 11 pembrolizumab 07/18/2017  Cycle 12 pembrolizumab 08/29/2017  Cycle 13 pembrolizumab 09/17/2017  Cycle 14 Pembrolizumab 10/10/2017  CT chest 10/24/2017-slight enlargement of small mediastinal lymph nodes, increased left upper lobe consolidation, decreased size of spleen lesion  Cycle 15 pembrolizumab 10/29/2017  Cycle 16 Pembrolizumab 11/21/2017  Cycle 17 Pembrolizumab 12/11/2017  Cycle 18 pembrolizumab 01/02/2018  Cycle 19 Pembrolizumab 01/21/2018  Cycle 20 Pembrolizumab 02/13/2018  CT chest 02/27/2018-decreased left paratracheal node, progressive consolidation/fibrosis in the left upper lung, decreased size of splenic mass  Cycle 21 pembrolizumab 03/04/2018  Cycle 22 Pembrolizumab 03/27/2018  Cycle 23 pembrolizumab 04/15/2018  Cycle 24 pembrolizumab 05/08/2018  Cycle 25 Pembrolizumab 05/27/2018  CT chest 06/16/2018-stable left upper lung radiation fibrosis with no evidence of local tumor recurrence. Decreased  left paratracheal adenopathy. No new or progressive  metastatic disease in the chest. Gastrohepatic ligament lymphadenopathy stable. Splenic metastasis decreased. T5 vertebral compression fracture with associated patchy sclerosis and no discrete osseous lesion, new in the interval.  Cycle 26 Pembrolizumab 06/19/2018  Cycle 27 pembrolizumab 07/08/2018  Cycle 28 pembrolizumab 07/31/2018  Cycle 29 pembrolizumab 08/19/2018  Cycle 30 Pembrolizumab 09/11/2018  Cycle 31 pembrolizumab 09/30/2018  Cycle 32 pembrolizumab 10/23/2018  CT chest 10/28/2018-left apical pleural-parenchymal opacity consistent with radiation scarring has become more confluent likely representing evolutionary change. Continued further decrease in size of index left paratracheal node and splenic metastasis. Gastrohepatic ligament lymph node not changed.  Cycle 33 Pembrolizumab 11/11/2018  Cycle 34 pembrolizumab 12/04/2018  Cycle 35 pembrolizumab 12/23/2018  CT chest 02/12/2019-posttreatment changes left upper lobe unchanged. Continued decrease in size of splenic metastasis. Stable appearance of left paratracheal lymph nodes. Stable gastrohepatic adenopathy. Incomplete visualization of retroperitoneal lymph nodes in the upper abdomen.  CT chest 07/02/2019-stable post treatment related changes left lung. 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 Slight regression of enlarged likely metastatic gastrohepatic lymph node.  CT chest 11/11/2019-interval enlargement of a right paratracheal lymph node measuring 14 mm, increased from 5 mm. No new lung mass or nodularity. New adenopathy in the upper abdomen. 2.7 cm lesion gastrohepatic ligament. Necrotic node at the splenic hilum measures 4.7 cm. Large node along the IVC left upper quadrant measures 2.7 cm. Similar node left adjacent the aorta measures 1.8 cm. Enhancing lesion in the spleen is unchanged. Several low-density lesions in the liver are unchanged.  Pembrolizumab resumed on a 3-week schedule 12/03/2019  CT chest  02/22/2020-interval resolution of previously demonstrated left paratracheal and upper abdominal adenopathy. Stable treated metastasis within the spleen. Stable radiation changes in the left lung with left hilar distortion. No evidence of local recurrence or progressive metastatic disease.  Continuation of pembrolizumab every 3 weeks 02/26/2020  2. 05/29/2019 laryngoscopy-exophytic appearing mass mid right vocal cord.   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 Tumor cells negative for p16, CMV and HSV 2. 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   Radiation 07/29/2019-08/25/2019  3. History of acute respiratory failure secondary to #1  4. Oxygen dependent COPD  5. History of a NSTEMI 2015  6. Right lung pneumonia on chest CT 12/12/2016-treated with Levaquin  7.Grade 2 rash possibly related to Pembrolizumab. Treatment held 08/06/2017.Improved 08/29/2017, treatment resumed, rash has resolved    Disposition: Brooke [name] appears unchanged.  She is tolerating the pembrolizumab well and there is no clinical evidence of disease progression.  The plan is to continue every 3-week pembrolizumab.  She will return for an office visit in 3 weeks. We will plan for a restaging CT evaluation in 2-3 months.  Betsy Coder, MD  04/28/2020  8:54 AM

## 2020-04-28 NOTE — Telephone Encounter (Signed)
Scheduled appt per 12/23 LOS - gave patient AVS and calender

## 2020-05-15 IMAGING — DX DG CHEST 2V
2 series · 2 of 2 positions shown · non-contrast
Comparison: 03/05/2019 and 07/02/2019

CLINICAL DATA: Cough and COPD.

EXAM:
CHEST - 2 VIEW

[chest pa]
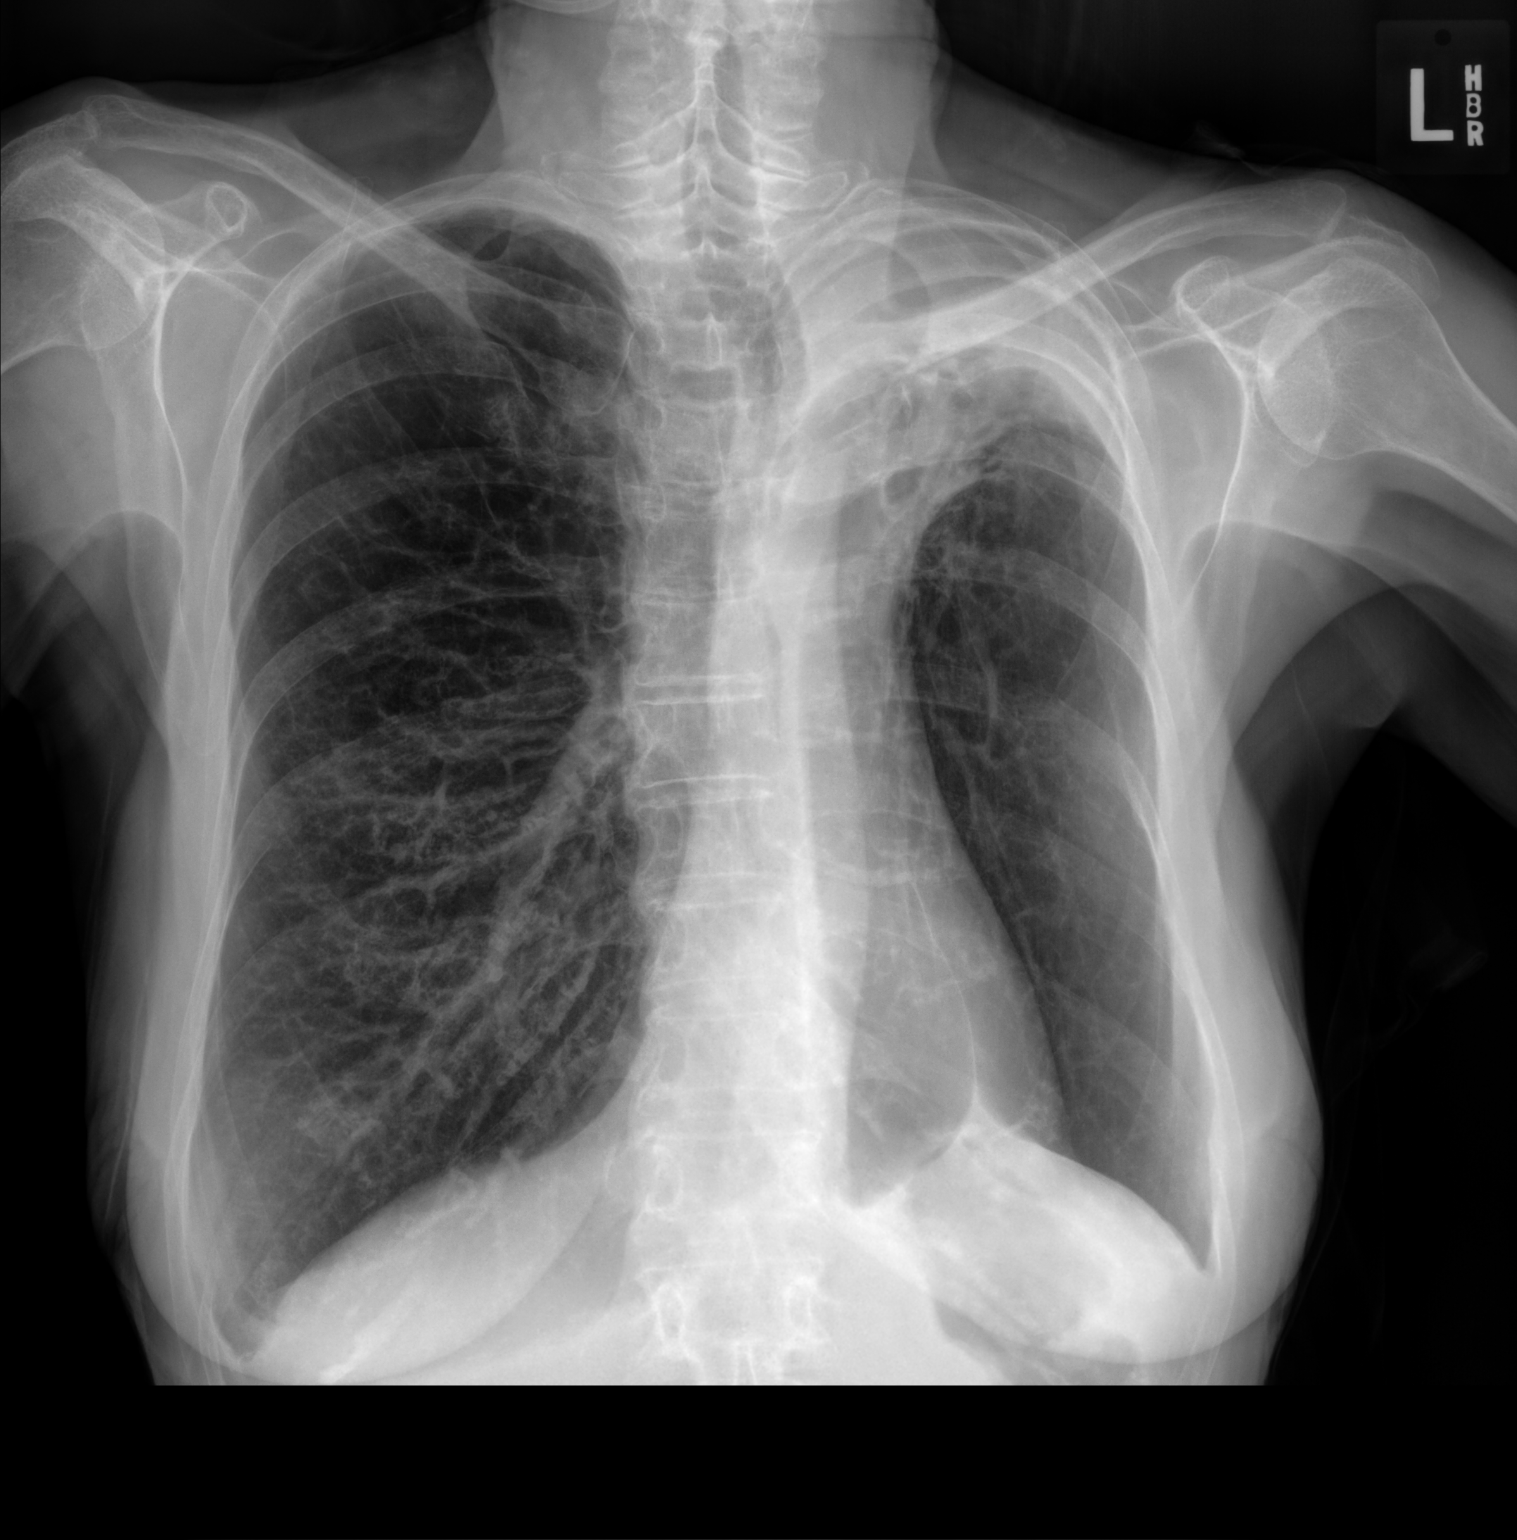

[chest lat]
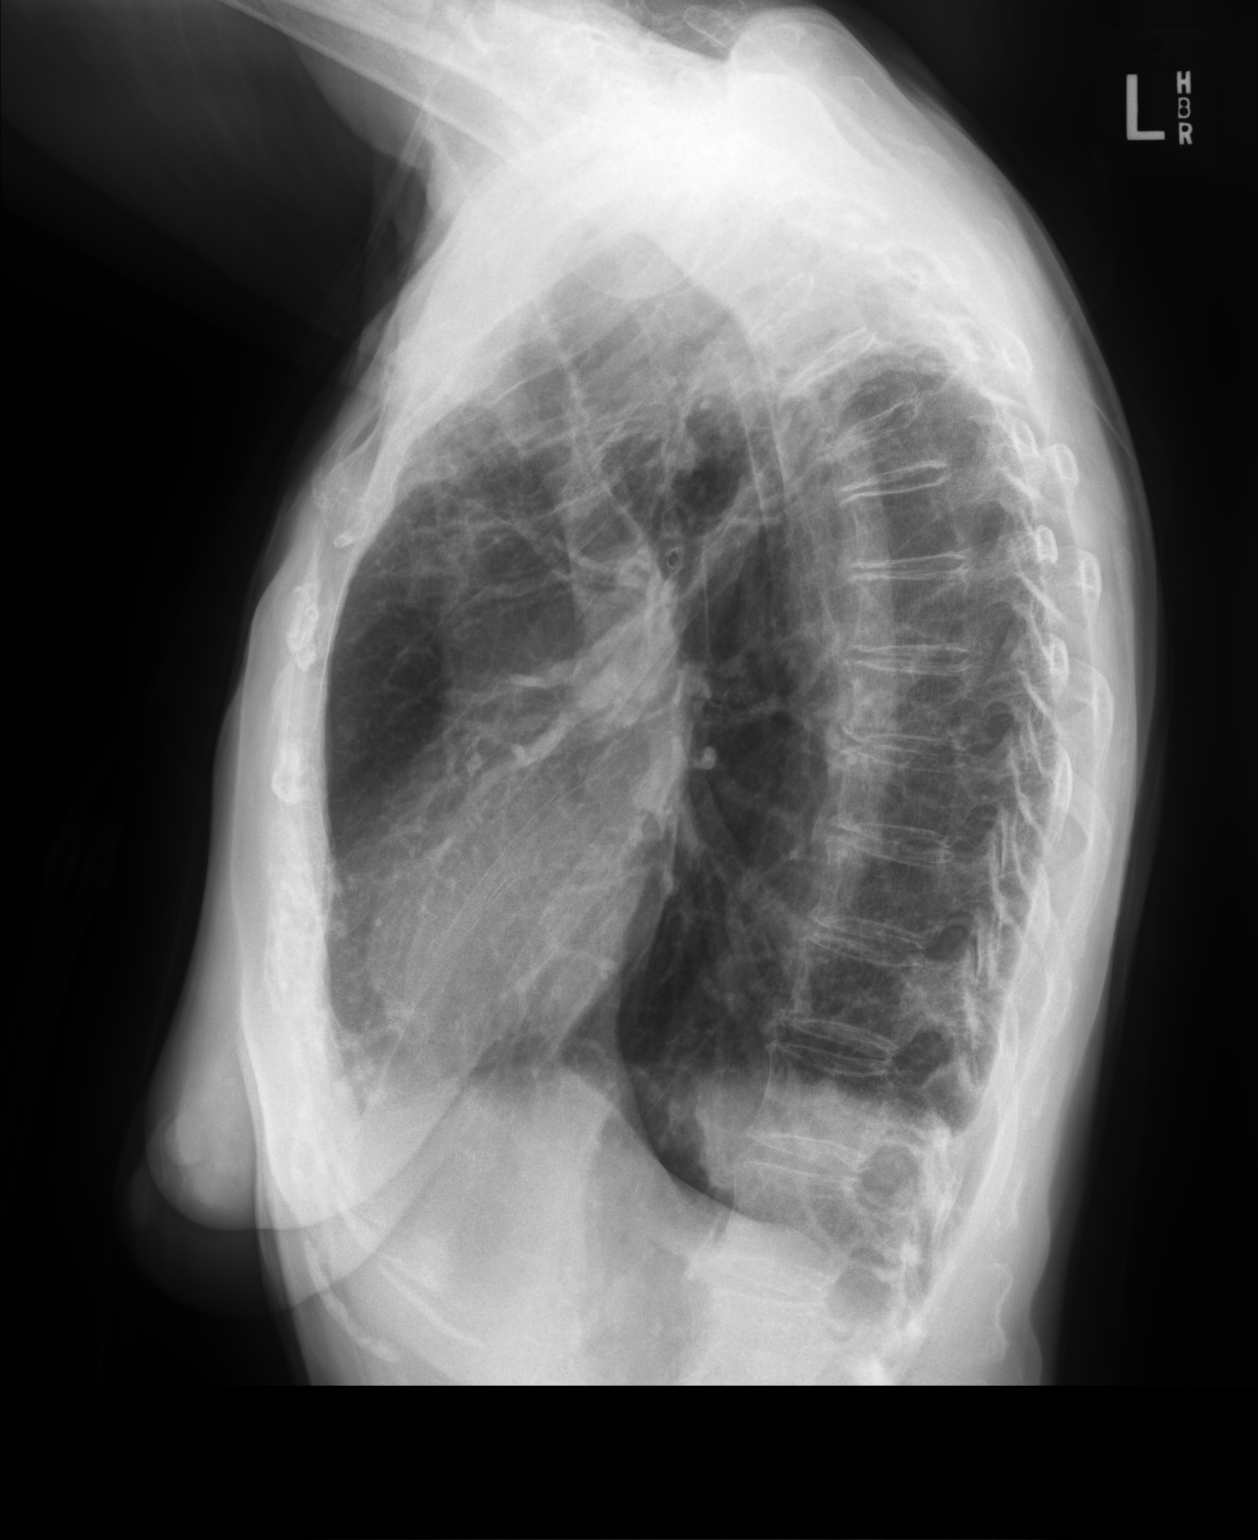

[2 of 2 positions shown; findings below may reference images not displayed]

FINDINGS: Stable hyperinflation. Perihilar peribronchial thickening is also
stable, consistent with chronic bronchitic changes. Opacity at the
LEFT lung apex shows long-term stability, consistent with post
radiation changes. No new or suspicious abnormality. Degenerative
changes are seen in the midthoracic spine. There is increased
density overlying the pedicles at T12, favored to represent
confluence of normal bony structures given long-term stability and
lack of osseous metastatic disease on interval CT exams.
IMPRESSION: 1. Stable appearance of the chest.
2. Hyperinflation and chronic bronchitic changes.
3. Stable appearance of LEFT apical opacity.

## 2020-05-16 ENCOUNTER — Other Ambulatory Visit: Payer: Self-pay | Admitting: Oncology

## 2020-05-20 ENCOUNTER — Inpatient Hospital Stay: Payer: Medicare Other

## 2020-05-20 ENCOUNTER — Inpatient Hospital Stay: Payer: Medicare Other | Attending: Oncology

## 2020-05-20 ENCOUNTER — Telehealth: Payer: Self-pay | Admitting: Nurse Practitioner

## 2020-05-20 ENCOUNTER — Other Ambulatory Visit: Payer: Self-pay

## 2020-05-20 ENCOUNTER — Inpatient Hospital Stay (HOSPITAL_BASED_OUTPATIENT_CLINIC_OR_DEPARTMENT_OTHER): Payer: Medicare Other | Admitting: Nurse Practitioner

## 2020-05-20 ENCOUNTER — Encounter: Payer: Self-pay | Admitting: Nurse Practitioner

## 2020-05-20 VITALS — BP 145/90 | HR 79 | Temp 97.8°F | Resp 18 | Ht 62.0 in | Wt 101.4 lb

## 2020-05-20 DIAGNOSIS — C3412 Malignant neoplasm of upper lobe, left bronchus or lung: Secondary | ICD-10-CM

## 2020-05-20 DIAGNOSIS — Z79899 Other long term (current) drug therapy: Secondary | ICD-10-CM | POA: Insufficient documentation

## 2020-05-20 DIAGNOSIS — R21 Rash and other nonspecific skin eruption: Secondary | ICD-10-CM | POA: Insufficient documentation

## 2020-05-20 DIAGNOSIS — Z9981 Dependence on supplemental oxygen: Secondary | ICD-10-CM | POA: Diagnosis not present

## 2020-05-20 DIAGNOSIS — I252 Old myocardial infarction: Secondary | ICD-10-CM | POA: Insufficient documentation

## 2020-05-20 DIAGNOSIS — Z5112 Encounter for antineoplastic immunotherapy: Secondary | ICD-10-CM | POA: Insufficient documentation

## 2020-05-20 DIAGNOSIS — J449 Chronic obstructive pulmonary disease, unspecified: Secondary | ICD-10-CM | POA: Diagnosis not present

## 2020-05-20 DIAGNOSIS — C7889 Secondary malignant neoplasm of other digestive organs: Secondary | ICD-10-CM | POA: Insufficient documentation

## 2020-05-20 LAB — CMP (CANCER CENTER ONLY)
ALT: 16 U/L (ref 0–44)
AST: 16 U/L (ref 15–41)
Albumin: 3.7 g/dL (ref 3.5–5.0)
Alkaline Phosphatase: 56 U/L (ref 38–126)
Anion gap: 7 (ref 5–15)
BUN: 14 mg/dL (ref 8–23)
CO2: 27 mmol/L (ref 22–32)
Calcium: 9.2 mg/dL (ref 8.9–10.3)
Chloride: 104 mmol/L (ref 98–111)
Creatinine: 0.7 mg/dL (ref 0.44–1.00)
GFR, Estimated: >60 mL/min (ref >60)
Glucose, Bld: 91 mg/dL (ref 70–99)
Potassium: 4.3 mmol/L (ref 3.5–5.1)
Sodium: 138 mmol/L (ref 135–145)
Total Bilirubin: 0.6 mg/dL (ref 0.3–1.2)
Total Protein: 6.9 g/dL (ref 6.5–8.1)

## 2020-05-20 LAB — CBC WITH DIFFERENTIAL (CANCER CENTER ONLY)
Abs Immature Granulocytes: 0.02 10*3/uL (ref 0.00–0.07)
Basophils Absolute: 0 10*3/uL (ref 0.0–0.1)
Basophils Relative: 1 %
Eosinophils Absolute: 0.1 10*3/uL (ref 0.0–0.5)
Eosinophils Relative: 1 %
HCT: 37.4 % (ref 36.0–46.0)
Hemoglobin: 12.4 g/dL (ref 12.0–15.0)
Immature Granulocytes: 1 %
Lymphocytes Relative: 22 %
Lymphs Abs: 1 10*3/uL (ref 0.7–4.0)
MCH: 29.2 pg (ref 26.0–34.0)
MCHC: 33.2 g/dL (ref 30.0–36.0)
MCV: 88.2 fL (ref 80.0–100.0)
Monocytes Absolute: 0.4 10*3/uL (ref 0.1–1.0)
Monocytes Relative: 9 %
Neutro Abs: 3 10*3/uL (ref 1.7–7.7)
Neutrophils Relative %: 66 %
Platelet Count: 203 10*3/uL (ref 150–400)
RBC: 4.24 MIL/uL (ref 3.87–5.11)
RDW: 13.9 % (ref 11.5–15.5)
WBC Count: 4.4 10*3/uL (ref 4.0–10.5)
nRBC: 0 % (ref 0.0–0.2)

## 2020-05-20 LAB — TSH: TSH: 1.233 u[IU]/mL (ref 0.308–3.960)

## 2020-05-20 MED ORDER — SODIUM CHLORIDE 0.9 % IV SOLN
Freq: Once | INTRAVENOUS | Status: AC
  Filled 2020-05-20: qty 250

## 2020-05-20 MED ORDER — SODIUM CHLORIDE 0.9 % IV SOLN
200.0000 mg | Freq: Once | INTRAVENOUS | Status: AC
  Administered 2020-05-20: 200 mg via INTRAVENOUS
  Filled 2020-05-20: qty 8

## 2020-05-20 NOTE — Patient Instructions (Signed)
Success Discharge Instructions for Patients Receiving Chemotherapy  Today you received the following immunotherapy agent: Pembrolizumab [name]  To help prevent nausea and vomiting after your treatment, we encourage you to take your nausea medication as directed by your MD.   If you develop nausea and vomiting that is not controlled by your nausea medication, call the clinic.   BELOW ARE SYMPTOMS THAT SHOULD BE REPORTED IMMEDIATELY:  *FEVER GREATER THAN 100.5 F  *CHILLS WITH OR WITHOUT FEVER  NAUSEA AND VOMITING THAT IS NOT CONTROLLED WITH YOUR NAUSEA MEDICATION  *UNUSUAL SHORTNESS OF BREATH  *UNUSUAL BRUISING OR BLEEDING  TENDERNESS IN MOUTH AND THROAT WITH OR WITHOUT PRESENCE OF ULCERS  *URINARY PROBLEMS  *BOWEL PROBLEMS  UNUSUAL RASH Items with * indicate a potential emergency and should be followed up as soon as possible.  Feel free to call the clinic should you have any questions or concerns. The clinic phone number is [phone].  Please show the [facility] at check-in to the Emergency Department and triage nurse.

## 2020-05-20 NOTE — Progress Notes (Signed)
Brooke [name] OFFICE PROGRESS NOTE   Diagnosis: Non-small cell lung cancer  INTERVAL HISTORY:   Brooke [name] returns as scheduled.  She completed another cycle of Pembrolizumab 04/28/2020.  No rash or diarrhea.  She denies nausea/vomiting.  She has a good appetite.  She is gaining weight.  Energy level has improved.  She has stable dyspnea.  Objective:  Vital signs in last 24 hours:  Blood pressure (!) 145/90, pulse 79, temperature 97.8 F (36.6 C), resp. rate 18, height 5' 2" (1.575 m), weight 101 lb 6.4 oz (46 kg), SpO2 100 %.    Resp: Lungs clear bilaterally. Cardio: Regular rate and rhythm. GI: Abdomen soft and nontender.  No hepatomegaly. Vascular: No leg edema. Skin: No rash.   Lab Results:  Lab Results  Component Value Date   WBC 4.4 05/20/2020   HGB 12.4 05/20/2020   HCT 37.4 05/20/2020   MCV 88.2 05/20/2020   PLT 203 05/20/2020   NEUTROABS 3.0 05/20/2020    Imaging:  No results found.  Medications: I have reviewed the patient's current medications.  Assessment/Plan: 1. 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  PDL1 60%  Left lung radiation 11/08/2016 through 11/21/2016  CT chest 12/12/2016-reexpansion of left upper lobe with a decreased left upper lobe mass, unchanged mediastinal adenopathy, progression of a splenic metastasis/pancreatic tail/gastrohepatic ligament metastasis, right lower lobe pneumonia  Cycle 1 Pembrolizumab 12/14/2016  Cycle 2 Pembrolizumab 01/03/2017  Cycle 3 Pembrolizumab 01/24/2017  Cycle 4 Pembrolizumab  02/12/2017  CT 03/05/2018-decrease in left upper lobe mass, mediastinal adenopathy, and splenic mass  Cycle 5 pembrolizumab 03/07/2017  Cycle 6 Pembrolizumab 04/02/2017  Cycle 7 pembrolizumab 04/25/2017  Cycle 8 Pembrolizumab 05/14/2017  Cycle 9 Pembrolizumab 06/06/2017  CT 06/20/2017-mild decrease in left upper lobe mass, mild increase in paramediastinal left upper lobe nodule-radiation change?, decreased splenic metastasis  Cycle 10 pembrolizumab 06/25/2017  Cycle 11 pembrolizumab 07/18/2017  Cycle 12 pembrolizumab 08/29/2017  Cycle 13 pembrolizumab 09/17/2017  Cycle 14 Pembrolizumab 10/10/2017  CT chest 10/24/2017-slight enlargement of small mediastinal lymph nodes, increased left upper lobe consolidation, decreased size of spleen lesion  Cycle 15 pembrolizumab 10/29/2017  Cycle 16 Pembrolizumab 11/21/2017  Cycle 17 Pembrolizumab 12/11/2017  Cycle 18 pembrolizumab 01/02/2018  Cycle 19 Pembrolizumab 01/21/2018  Cycle 20 Pembrolizumab 02/13/2018  CT chest 02/27/2018-decreased left paratracheal node, progressive consolidation/fibrosis in the left upper lung, decreased size of splenic mass  Cycle 21 pembrolizumab 03/04/2018  Cycle 22 Pembrolizumab 03/27/2018  Cycle 23 pembrolizumab 04/15/2018  Cycle 24 pembrolizumab 05/08/2018  Cycle 25 Pembrolizumab 05/27/2018  CT chest 06/16/2018-stable left upper lung radiation fibrosis with no evidence of local tumor recurrence. Decreased left paratracheal adenopathy. No new or progressive metastatic disease in the chest. Gastrohepatic ligament lymphadenopathy stable. Splenic metastasis decreased. T5 vertebral compression fracture with associated patchy sclerosis and no discrete osseous lesion, new in the interval.  Cycle 26 Pembrolizumab 06/19/2018  Cycle 27 pembrolizumab 07/08/2018  Cycle 28 pembrolizumab 07/31/2018  Cycle 29 pembrolizumab 08/19/2018  Cycle 30 Pembrolizumab 09/11/2018  Cycle 31 pembrolizumab 09/30/2018  Cycle 32  pembrolizumab 10/23/2018  CT chest 10/28/2018-left apical pleural-parenchymal opacity consistent with radiation scarring has become more confluent likely representing evolutionary change. Continued  further decrease in size of index left paratracheal node and splenic metastasis. Gastrohepatic ligament lymph node not changed.  Cycle 33 Pembrolizumab 11/11/2018  Cycle 34 pembrolizumab 12/04/2018  Cycle 35 pembrolizumab 12/23/2018  CT chest 02/12/2019-posttreatment changes left upper lobe unchanged. Continued decrease in size of splenic metastasis. Stable appearance of left paratracheal lymph nodes. Stable gastrohepatic adenopathy. Incomplete visualization of retroperitoneal lymph nodes in the upper abdomen.  CT chest 07/02/2019-stable post treatment related changes left lung. 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 Slight regression of enlarged likely metastatic gastrohepatic lymph node.  CT chest 11/11/2019-interval enlargement of a right paratracheal lymph node measuring 14 mm, increased from 5 mm. No new lung mass or nodularity. New adenopathy in the upper abdomen. 2.7 cm lesion gastrohepatic ligament. Necrotic node at the splenic hilum measures 4.7 cm. Large node along the IVC left upper quadrant measures 2.7 cm. Similar node left adjacent the aorta measures 1.8 cm. Enhancing lesion in the spleen is unchanged. Several low-density lesions in the liver are unchanged.  Pembrolizumab resumed on a 3-week schedule 12/03/2019  CT chest 02/22/2020-interval resolution of previously demonstrated left paratracheal and upper abdominal adenopathy. Stable treated metastasis within the spleen. Stable radiation changes in the left lung with left hilar distortion. No evidence of local recurrence or progressive metastatic disease.  Continuation of pembrolizumab every 3 weeks 02/26/2020  2. 05/29/2019 laryngoscopy-exophytic appearing mass mid right vocal cord.   Biopsy 06/11/2019-at least squamous  cell carcinoma in situ. Tumor cells negative for p16, CMV and HSV 2. 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   Radiation 07/29/2019-08/25/2019  3. History of acute respiratory failure secondary to #1  4. Oxygen dependent COPD  5. History of a NSTEMI 2015  6. Right lung pneumonia on chest CT 12/12/2016-treated with Levaquin  7.Grade 2 rash possibly related to Pembrolizumab. Treatment held 08/06/2017.Improved 08/29/2017, treatment resumed, rash has resolved   Disposition: Brooke [name] appears unchanged.  There is no clinical evidence of disease progression.  Plan to continue every 3-week Pembrolizumab, treatment today.  Restaging CT evaluation in approximately 2 months.  She will return for lab, follow-up, Pembrolizumab in 3 weeks.    [name] ANP/GNP-BC   05/20/2020  10:11 AM

## 2020-05-20 NOTE — Telephone Encounter (Signed)
Scheduled appointments per 1/14 los. Spoke to patient who is aware of appointments date and times.

## 2020-05-23 ENCOUNTER — Ambulatory Visit: Payer: Medicare Other | Admitting: Internal Medicine

## 2020-05-24 ENCOUNTER — Telehealth: Payer: Self-pay | Admitting: Cardiovascular Disease

## 2020-05-24 NOTE — Telephone Encounter (Signed)
Pt c/o BP issue: STAT if pt c/o blurred vision, one-sided weakness or slurred speech  1. What are your last 5 BP readings? Today 114/71, 103/63, Saturday 200/100  2. Are you having any other symptoms (ex. Dizziness, headache, blurred vision, passed out)? Vertigo Saturday, dizzy Sunday as well, sinus headache on Sunday  3. What is your BP issue? Patient states her BP spiked on Saturday to 200/100. She states she also had vertigo put was able to get it to go away by Monday. She states she has not had symptoms today. She has a virtual appointment 05/27/2020 with Dr. Oval Linsey.

## 2020-05-24 NOTE — Telephone Encounter (Signed)
Returned call to patient to discuss: Since OV with [name] PA, reports BP continues to elevate and fluctuate. Notices it goes up when she goes to the cancer center for treatments.  She gets very anxious about her treatments and also using [employer] [facility] who she has had a few issues with.     She states Saturday she woke up with severe vertigo, was unable to walk.  BP was elevated 200/100s.    PCP called in medication but she was unable to get this from the pharmacy (due to snow).   She reports symptoms resolved by Saturday night.    Says she may have a "slight" sinus HA.     Today, BP was 114/71, 103/63 HR 80s.   She denies symptoms at this time. She takes amlodipine 2.5 mg daily.   Also does neb tx daily and on home o2.      Per chart review: BP at last visits with oncologist:  1/14 145/90 HR 79 12/23 179/81 HR 76   Advised to continue to monitor BP and keep log for appointment on Friday with Dr. Oval Linsey.  Routed to MD and primary to make aware.

## 2020-05-27 ENCOUNTER — Telehealth (INDEPENDENT_AMBULATORY_CARE_PROVIDER_SITE_OTHER): Payer: Medicare Other | Admitting: Cardiovascular Disease

## 2020-05-27 ENCOUNTER — Telehealth: Payer: Self-pay | Admitting: Cardiovascular Disease

## 2020-05-27 ENCOUNTER — Encounter: Payer: Self-pay | Admitting: Cardiovascular Disease

## 2020-05-27 VITALS — BP 143/83 | HR 76 | Wt 101.0 lb

## 2020-05-27 DIAGNOSIS — R0989 Other specified symptoms and signs involving the circulatory and respiratory systems: Secondary | ICD-10-CM | POA: Insufficient documentation

## 2020-05-27 DIAGNOSIS — I214 Non-ST elevation (NSTEMI) myocardial infarction: Secondary | ICD-10-CM | POA: Diagnosis not present

## 2020-05-27 DIAGNOSIS — E7800 Pure hypercholesterolemia, unspecified: Secondary | ICD-10-CM

## 2020-05-27 HISTORY — DX: Other specified symptoms and signs involving the circulatory and respiratory systems: R09.89

## 2020-05-27 MED ORDER — AMLODIPINE BESYLATE 2.5 MG PO TABS
ORAL_TABLET | ORAL | 3 refills | Status: DC
Start: 2020-05-27 — End: 2021-07-17

## 2020-05-27 NOTE — Patient Instructions (Signed)
Medication Instructions:  START amlodipine as directed 2.5mg  - 1 tablet - if top number of BP is 140-159 5mg  - 2 tablets - if top number of BP is 160-179 7.5mg  - 3 tablets - if top number of BP is over 180  *If you need a refill on your cardiac medications before your next appointment, please call your pharmacy*   Follow-Up: At [facility], you and your health needs are our priority.  As part of our continuing mission to provide you with exceptional heart care, we have created designated Provider Care Teams.  These Care Teams include your primary Cardiologist (physician) and Advanced Practice Providers (APPs -  Physician Assistants and Nurse Practitioners) who all work together to provide you with the care you need, when you need it.  We recommend signing up for the patient portal called "MyChart".  Sign up information is provided on this After Visit Summary.  MyChart is used to connect with patients for Virtual Visits (Telemedicine).  Patients are able to view lab/test results, encounter notes, upcoming appointments, etc.  Non-urgent messages can be sent to your provider as well.   To learn more about what you can do with MyChart, go to [email or web address].    Your next appointment:   3 month(s)  The format for your next appointment:   In Person  Provider:   Dr. Oval Linsey  Other Instructions

## 2020-05-27 NOTE — Telephone Encounter (Signed)
Patient called to review e-visit instructions. Patient aware that the following changes have been made: start amlodipine 2.5mg  as directed per AVS, instructions Patient aware that they will need the following labs: NONE Patient aware that they will need the following test(s): NONE  Recall for N/A entered.  Scheduled for appointment Monday May 2 @ 9:40am  No further assistance needed at this time.

## 2020-05-27 NOTE — Progress Notes (Signed)
Virtual Visit via Telephone Note   This visit type was conducted due to national recommendations for restrictions regarding the COVID-19 Pandemic (e.g. social distancing) in an effort to limit this patient's exposure and mitigate transmission in our community.  Due to her co-morbid illnesses, this patient is at least at moderate risk for complications without adequate follow up.  This format is felt to be most appropriate for this patient at this time.  The patient did not have access to video technology/had technical difficulties with video requiring transitioning to audio format only (telephone).  All issues noted in this document were discussed and addressed.  No physical exam could be performed with this format.  Please refer to the patient's chart for her  consent to telehealth for [facility].   Date:  05/27/2020   ID:  Janaia, Kozel 1945/08/16, MRN [id number] The patient was identified using 2 identifiers.  Patient Location: Home Provider Location: Office/Clinic  PCP:  [name], MD  Cardiologist:  Skeet Latch, MD  Electrophysiologist:  None   Evaluation Performed:  Follow-Up Visit  Chief Complaint: Hypertension  History of Present Illness:    Shantrice Rodenberg is a 75 y.o. female with COPD on home O2, nonobstructive CAD, hyperlipidemia, stage IV lung cancer, GERD, and prior tobacco abuse here for follow-up.  She was initially seen by cardiology 06/2013 when she had an NSTEMI.  Heart cath at that time revealed minimal, non-obstructive CAD.  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  LVEF was 60%.  It was thought that her symptoms were attributable to spasm.  She takes atorvastatin every other day to avoid myalgias.  Amlodipine was started for spasms.  She was diagnosed with lung cancer in 2017.  A bronchoscopy was recommended but she declined at that time.  In 10/2016 she developed progressive symptoms with metastatic disease.  She  completed 35 cycles of chemotherapy with pembrolizumab.  This helped her metastatic disease but not her primary mass.  She was subsequently restarted on chemotherapy with pembrolizumab.  Ms. Yarbough saw [name], [place], on 12/2019 with elevated blood pressure.  He recommended watchful waiting at that time.  It was noted that anxiety tended to trigger her blood pressure.  She followed up again 04/2020 and her blood pressure was 140/90.  Last Friday she went for there Keytruda infusion.  The next day she woke up with vertigo.  A nurse came to evaluate her and her BP was 200/100.  She took 7.5mg  of amlodipine.  By the next day her BP was better.  She then started taking 2.5 mg daily.  Her BP has been 100s-130s/60-70s.  She had one episode where it dropped to the 70s and she felt lightheaded.  This morning she has not taken amlodipine.  Most of her BP has been 100s-110s.  Since then she did Epley maneuvers and her vertigo has resolved.  She has also been drinking more water because she was dehydrated after her treatment.  She has been tired and her appetite has been poor but slowly improving.  The patient does not have symptoms concerning for COVID-19 infection (fever, chills, cough, or new shortness of breath).    Past Medical History:  Diagnosis Date   Chronic respiratory failure ([place])    a. on home O2.   COPD (chronic obstructive pulmonary disease) ([place])    a. Home O2.   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 Coronary vasospasm   Past Surgical History:  Procedure Laterality Date   LEFT HEART CATHETERIZATION WITH CORONARY ANGIOGRAM N/A 06/08/2013   Procedure: LEFT HEART  CATHETERIZATION WITH CORONARY ANGIOGRAM;  Surgeon: Sanda Klein, MD;  Location: [place] [facility]ATH [facility];  Service: Cardiovascular;  Laterality: N/A;     Current Meds  Medication Sig   acetaminophen (TYLENOL) 325 MG tablet Per bottle as needed   albuterol (PROVENTIL HFA;VENTOLIN HFA) 108 (90 BASE) MCG/ACT inhaler Inhale 2 puffs into the lungs every 4 (four) hours as needed for wheezing or shortness of breath. **PLAN B**   ALPRAZolam (XANAX) 0.25 MG tablet 1/2-1 twice daily as needed   amLODipine (NORVASC) 2.5 MG tablet Take by mouth based on top number of BP. 1 tab (2.5mg ) if BP 140-159, 2 tabs (5mg ) for 160-179, 3 tabs (7.5 mg) for BP >180   atorvastatin (LIPITOR) 20 MG tablet TAKE 1 TABLET(20 MG) BY MOUTH DAILY AT 6 PM   Budeson-Glycopyrrol-Formoterol (BREZTRI AEROSPHERE) 160-9-4.8 MCG/ACT AERO Inhale 2 puffs into the lungs in the morning and at bedtime.   budesonide-formoterol (SYMBICORT) 160-4.5 MCG/ACT inhaler    docusate sodium (COLACE) 100 MG capsule Take 100-200 mg by mouth daily.   guaiFENesin (MUCINEX) 600 MG 12 hr tablet Take 600 mg by mouth 2 (two) times daily as needed for cough or to loosen phlegm.   ibuprofen (ADVIL,MOTRIN) 600 MG tablet Take 600 mg by mouth every 6 (six) hours as needed.   ipratropium-albuterol (DUONEB) 0.5-2.5 (3) MG/3ML SOLN Take 3 mLs by nebulization every 4 (four) hours as needed (wheezing/shortness of breath). **PLAN C**   nitroGLYCERIN (NITROSTAT) 0.4 MG SL tablet ONE TABLET UNDER TONGUE AS NEEDED FOR CHEST PAIN EVERY 5 MINUTES FOR 3 DOSES   NON FORMULARY [facility]  Onychomycosis Nail Lacquer -  Fluconazole 2%, Terbinafine 1% DMSO/undecylenic acid 25% Apply to affected nail once daily Qty. 120 gm 3 refills   OXYGEN 2lpm 24/7   Probiotic Product (PROBIOTIC ADVANCED PO) Take 1 tablet by mouth daily.   Simethicone 180 MG CAPS Use as needed     Allergies:   Codeine, Imdur [isosorbide dinitrate], Prednisone, and Pulmicort [budesonide]    Social History   Tobacco Use   Smoking status: Former Smoker    Packs/day: 1.00    Years: 30.00    Pack years: 30.00    Types: Cigarettes    Quit date: 05/08/2007    Years since quitting: 13.0   Smokeless tobacco: Never Used  Vaping Use   Vaping Use: Never used  Substance Use Topics   Alcohol use: Yes    Comment: rarely   Drug use: No     Family Hx: The patient's family history includes Lung cancer in her father. There is no history of Heart attack, Stroke, or Hypertension.  ROS:   Please see the history of present illness.    All other systems reviewed and are negative.   Prior CV studies:   The following studies were reviewed today:  Echo 06/06/2013: Study Conclusions   Left ventricle: The cavity size was normal. Systolic  function was normal. The estimated ejection fraction was in  the range of 50% to 55%. Regional wall motion abnormalities  cannot  be excluded. Doppler parameters are consistent with  abnormal left ventricular relaxation (grade 1 diastolic  dysfunction).     Labs/Other Tests and Data Reviewed:    EKG:  An ECG dated 12/22/19 was personally reviewed today and demonstrated:  sinus rhythm.  Rate 72 bpm.  Cannot rule out prior septal infarct.  Recent Labs: 05/20/2020: ALT 16; BUN 14; Creatinine 0.70; Hemoglobin 12.4; Platelet Count 203; Potassium 4.3; Sodium 138; TSH 1.233   Recent Lipid Panel Lab Results  Component Value Date/Time   CHOL 190 02/20/2019 12:00 AM   TRIG 45 02/20/2019 12:00 AM   HDL 87 02/20/2019 12:00 AM   CHOLHDL 2.2 02/20/2019 12:00 AM   CHOLHDL 2.2 08/18/2015 10:41 AM   LDLCALC 94 02/20/2019 12:00 AM    Wt Readings from Last 3 Encounters:  05/27/20 101 lb (45.8 kg)  05/20/20 101 lb 6.4 oz (46 kg)  04/28/20 99 lb 9.6 oz (45.2 kg)     Risk Assessment/Calculations:      Objective:    BP (!) 143/83    Pulse 76    Wt 101 lb (45.8 kg)    BMI 18.47 kg/m  GENERAL: Sounds well.  No acute distress. RESP:  Respirations unlabored. NEURO:  Speech fluent.  PSYCH:  Cognitively intact, oriented to person place and time  ASSESSMENT & PLAN:    # Essential hypertension:  BP was very elevated last week but has improved.  It seems that it may have been related to her vertigo.  She also notes that anxiety triggers it.  Her BP is slightly high today but has been generally low.  She will keep checking her blood pressure daily.  If her systolic blood pressure is 140 to 159 she will take 2.5 mg of amlodipine.  If it is 1 60-1 79 she will take 5 mg, and if it is greater than 180 she will take 7.5 mg.  # Myocardial bridging:  Amlodipine as above.  # Hyperlipidemia:  Continue atorvastatin.    COVID-19 Education: The signs and symptoms of COVID-19 were discussed with the patient and how to seek care for testing (follow up with PCP or arrange E-visit).  The importance of social distancing was discussed today.  Time:   Today, I have spent 23 minutes with the patient with telehealth technology discussing the above problems.     Medication Adjustments/Labs and Tests Ordered: Current medicines are reviewed at length with the patient today.  Concerns regarding medicines are outlined above.   Tests Ordered: No orders of the defined types were placed in this encounter.   Medication Changes: Meds ordered this encounter  Medications   amLODipine (NORVASC) 2.5 MG tablet    Sig: Take by mouth based on top number of BP. 1 tab (2.5mg ) if BP 140-159, 2 tabs (5mg ) for 160-179, 3 tabs (7.5 mg) for BP >180    Dispense:  180 tablet    Refill:  3    Follow Up:  In Person in 3 month(s)  Signed, Skeet Latch, MD  05/27/2020 10:42 AM    [place]

## 2020-06-03 ENCOUNTER — Other Ambulatory Visit: Payer: Self-pay | Admitting: Oncology

## 2020-06-06 ENCOUNTER — Encounter: Payer: Self-pay | Admitting: Internal Medicine

## 2020-06-06 ENCOUNTER — Other Ambulatory Visit: Payer: Self-pay

## 2020-06-06 ENCOUNTER — Ambulatory Visit (INDEPENDENT_AMBULATORY_CARE_PROVIDER_SITE_OTHER): Payer: Medicare Other | Admitting: Internal Medicine

## 2020-06-06 DIAGNOSIS — J449 Chronic obstructive pulmonary disease, unspecified: Secondary | ICD-10-CM | POA: Diagnosis not present

## 2020-06-06 DIAGNOSIS — J9611 Chronic respiratory failure with hypoxia: Secondary | ICD-10-CM

## 2020-06-06 NOTE — Patient Instructions (Addendum)
Try albuterol 15 min before an activity that you know would make you short of breath and see if it makes any difference and if makes none then don't take it after activity unless you can't catch your breath.      Start Breztri 2 in am and symbicort x 2 pffs 12 hours later - once you finish the symbicort, use breztri just like you did the symbicort    Make sure you check your oxygen saturation  at your highest level of activity  to be sure it stays over 90% and adjust  02 flow upward to maintain this level if needed but remember to turn it back to previous settings when you stop (to conserve your supply).    Please schedule a follow up visit in 3 months but call sooner if needed

## 2020-06-06 NOTE — Progress Notes (Signed)
Subjective:    Patient ID: Brooke [name], female   DOB: 06-10-1945,    MRN: [id number]     Brief patient profile:  43 yowf quit smoking 2009 on 02 noct 2012 maint on symbicort 160 since aorund  2014 referred to pulmonary clinic 02/15/2016 by Dr   Kathryne Eriksson for RUL Lung mass dx with Stage IV nsc 11/01/16 in setting of GOLD IV copd      History of Present Illness  02/15/2016 1st [facility] office visit/ Brooke [name]  GOLD IV copd/ symb 160 2bid maint  Chief Complaint  Patient presents with  . Pulmonary Consult    referred by Dr. Kathryne Eriksson for COPD.  MMRC2 = can't walk a nl pace on a flat grade s sob but does fine slow and flat eg [employer] on 2lpm  New onset bloody mucus plugs November 11 2015 assoc with wt loss  02 2lpm hs and with walking / rare saba needed rec For cough > mucinex up to 1200 mg every 12 hours as needed Rec:  Fob but refused   11/01/16   CT directed bx >>>  Pos NSC LUL    Diagnosis:   Stage IV NSCLC, Squamous cell carcinoma of the left upper lobe.     Indication for treatment:  palliative       Radiation treatment dates:   11/08/2016 to 11/21/2016  Site/dose:   The Left lung was treated to 30 Gy in 10 fractions at 3 Gy per fraction.   Beams/energy:   3D // 10X, 6X    12/06/2016  f/u ov/Brooke [name] re: post hosp f/u -  Now on dulera 200 bid and duoneb Chief Complaint  Patient presents with  . HFU    Breathing is doing well. She is using Duoneb 2 x daily and uses albuterol inhaler 1-2 x per wk on average.   presently in SNF ever since last admit 6/18-29/18 for  Active Problems:   COPD  GOLD IV    Mass of upper lobe of left lung   Chronic respiratory failure with hypoxia (HCC)   COPD exacerbation (HCC)   Hypokalemia   Microcytic anemia   SOB (shortness of breath)   Protein-calorie malnutrition, severe Doe = 2lpm x 160ft with rolling walking  Sleeping ok at < 30 degrees Doe = 2lpm x 137ft with rolling walker/2lpm Sleeping ok at < 30 degrees   rec 02 can be adjusted down and off for saturations above 90%       12/09/2017  f/u ov/Brooke [name] re: GOLD IV copd/  No 02 at rest/ 2lpm sleep and with activity  Chief Complaint  Patient presents with  . Follow-up    Breathing is overall doing well. She has occ cough with white sputum- relates to PND.  She uses her albuterol inhaler 2 x daily on average. She states she uses neb before she knows she is going somewhere.   Dyspnea:  MMRC2 = can't walk a nl pace on a flat grade s sob but does fine slow and flat eg still pushing cart  Cough: min pnd  SABA use: as above  rec Continue  symbicort and add stiolto 2 pffs each am - - if you don't like it for any reason resume the symbicort alone as per your med calendar  Please schedule a follow up visit in 3 months but call sooner if needed  - add:  Pt notified above typo should have said add spiriva x 2 pffs each am  and continue symbicort Take 2 puffs first thing in am and then another 2 puffs about 12 hours later.   - 12/12/2017 informed could not tol spiriva due to dry mouth   03/14/2018  f/u ov/Brooke [name] re:  GOLD IV / 02 is 2lpm  Hs and 2lpm exertion/   RA sitting / just on symbicort 160 2bid  Chief Complaint  Patient presents with  . Follow-up    increased cough with white sputum. She states her breathing has been doing well. She is using her albuterol inhaler 2 x per wk and neb maybe 2 x  per wk on average.    Dyspnea:  [employer] ok pushing cart on 2lpm  Cough: more since 2 weeks  Sleeping: flat bed/ 2-3 pillow  SABA use: avg as above 02: as above   rec Work on inhaler technique:   Ok to take augmentin unless it makes you nauseated  Please schedule a follow up visit in 3 months but call sooner if needed      06/13/2018  f/u ov/Brooke [name] re: GOLD IV / 02 hs and prn  Chief Complaint  Patient presents with  . Shortness of Breath    Worse since the last visit.  Dyspnea:  Still doing [employer]  On 2lpm but not checking sats as rec with activity   Cough: dry worse day than noct Sleeping: falt bed / couple of pillows SABA use: helps some / uses once a day esp in am 02: 2lpm hs and prn  rec Plan A = Automatic = symbicort 160 Take 2 puffs first thing in am and then another 2 puffs about 12 hours later.  Work on inhaler technique: Plan B = Backup Only use your albuterol inhaler as a rescue medication Plan C = Crisis - only use your albuterol/ipatropium nebulizer if you first try Plan B and it fails to help > ok to use the nebulizer up to every 4 hours but if start needing it regularly call for immediate appointment Please schedule a follow up visit in 3 months but call sooner if needed  - add chart review does not show alpha one screen done    03/05/2019 acute ext ov/Brooke [name] re:  GOLD IV / 02 dep  Chief Complaint  Patient presents with  . Acute Visit    Increased cough and SOB for the past 2 wks. She has already been treated with zpack. She is using her albuterol inhaler several x per day and neb a couple times per day.   Dyspnea:  Getting worse x months but esp x 2 weeks  Cough: yellow mucus x 2 weeks and no change p zpak  Sleeping: bed is flat, sev pillows SABA use: some better  02: 2lpm 24/7  rec Depomedrol 120 mg IM today  Levaquin 500 mg daily x 7 days  Please remember to go to the  x-ray department  for your tests - we will call you with the results when they are available     11/30/220 televisit, new hoarse rec  gerd rx > ENT F/u > sq cell ca vc    Last RT April 20th 2021 in [place]    09/15/2019  f/u ov/Brooke [name] re: worse sob and hoarseness p RT  Chief Complaint  Patient presents with  . Follow-up    Pt c/o increased SOB and coughing up frothy sputum x 3 wks- relates to RT.    Dyspnea:  Across the room x 3 weeks Cough: more frothy  Sleeping: flat bed 3 pillows  SABA use: hfa and duoneb  02: 2lpm hs and walking and not at rest  Eating ok / no choking on  Food but feels something stuck in throat making  swallowing difficult rec Depomedrol 120 mg IM today  Levaquin 500 mg daily x 7 days  Try prilosec otc 20mg   Take 30-60 min before first meal of the day and Pepcid ac (famotidine) 20 mg one @  bedtime until cough is completely gone for at least a week without the need for cough suppression GERD Keep your previous appt   / see ENT asap    10/16/2019  f/u ov/Brooke [name] re: GOLD IV copd/ 02 dep / has not seen ent  Chief Complaint  Patient presents with  . Follow-up    COPD, no new issues  Dyspnea:  60 ft x 10 laps s  stopping on 2lpm and says sats stay in 90s   Cough: better  Sleeping: bed is flat/ 3 pillows  SABA use: occ  02: 2lpm 24/7  L cp comes and goes positional no pleuritic or ex related  rec 2lpm bedtime but goal is > 90% saturation 24/7 so adjust during the day  Follow up with ENT as soon as you can    01/18/2020  f/u ov/Brooke [name] re:  GOLD IV / 02 dep  plans to see [facility]ino sept 27 p completing RT for VC cancer and now on Ketruda for dz progression  Chief Complaint  Patient presents with  . Follow-up    reports episode of vertigo about 2 weeks ago that has improved since onset   Dyspnea:  Still doing 60 ft circuits but slowed down due to vertigo with sats typically ok on 2lpm  Cough: sometimes related to pnds Sleeping: bed is flat, 3 pillows  SABA use:   02: 2lpm 24/7   rec No change in symbicort as your maintenance  Only use your albuterol (either the puffer or the nebulizer) as a rescue medication   06/06/2020  f/u ov/Brooke [name] re:  GOLD IV / 02 dep  Chief Complaint  Patient presents with  . Follow-up    Sob same, vertigo, did not start Breztri yet   Dyspnea:  Still doing 60 ft circuit x 30 most days   Slowed by "asthma feeling" on symbicort  Cough: very hoarse, no excess mucus but some pnds  Sleeping:  Bed is flat and 2 pillows  SABA use: both hfa and nebs but never pre or rechallenges   02: 2lpm 24/7 does not check sats at peak ex    No obvious day to day or daytime  variability or assoc excess/ purulent sputum or mucus plugs or hemoptysis or cp or chest tightness, subjective wheeze or overt sinus or hb symptoms.   Sleeping  without nocturnal  or early am exacerbation  of respiratory  c/o's or need for noct saba. Also denies any obvious fluctuation of symptoms with weather or environmental changes or other aggravating or alleviating factors except as outlined above   No unusual exposure hx or h/o childhood pna/ asthma or knowledge of premature birth.  Current Allergies, Complete Past Medical History, Past Surgical History, Family History, and Social History were reviewed in [employer] record.  ROS  The following are not active complaints unless bolded Hoarseness, sore throat, dysphagia, dental problems, itching, sneezing,  nasal congestion or discharge of excess mucus or purulent secretions, ear ache,   fever, chills, sweats, unintended wt loss or wt  gain, classically pleuritic or exertional cp,  orthopnea pnd or arm/hand swelling  or leg swelling, presyncope, palpitations, abdominal pain, anorexia, nausea, vomiting, diarrhea  or change in bowel habits or change in bladder habits, change in stools or change in urine, dysuria, hematuria,  rash, arthralgias, visual complaints, headache, numbness, weakness or ataxia or problems with walking or coordination,  change in mood or  Memory. vertigo        Current Meds  Medication Sig  . acetaminophen (TYLENOL) 325 MG tablet Per bottle as needed  . albuterol (PROVENTIL HFA;VENTOLIN HFA) 108 (90 BASE) MCG/ACT inhaler Inhale 2 puffs into the lungs every 4 (four) hours as needed for wheezing or shortness of breath. **PLAN B**  . ALPRAZolam (XANAX) 0.25 MG tablet 1/2-1 twice daily as needed  . amLODipine (NORVASC) 2.5 MG tablet Take by mouth based on top number of BP. 1 tab (2.5mg ) if BP 140-159, 2 tabs (5mg ) for 160-179, 3 tabs (7.5 mg) for BP >180  . atorvastatin (LIPITOR) 20 MG tablet TAKE 1  TABLET(20 MG) BY MOUTH DAILY AT 6 PM  . budesonide-formoterol (SYMBICORT) 160-4.5 MCG/ACT inhaler   . docusate sodium (COLACE) 100 MG capsule Take 100-200 mg by mouth daily.  Marland Kitchen guaiFENesin (MUCINEX) 600 MG 12 hr tablet Take 600 mg by mouth 2 (two) times daily as needed for cough or to loosen phlegm.  Marland Kitchen ibuprofen (ADVIL,MOTRIN) 600 MG tablet Take 600 mg by mouth every 6 (six) hours as needed.  Marland Kitchen ipratropium-albuterol (DUONEB) 0.5-2.5 (3) MG/3ML SOLN Take 3 mLs by nebulization every 4 (four) hours as needed (wheezing/shortness of breath). **PLAN C**  . nitroGLYCERIN (NITROSTAT) 0.4 MG SL tablet ONE TABLET UNDER TONGUE AS NEEDED FOR CHEST PAIN EVERY 5 MINUTES FOR 3 DOSES  . NON FORMULARY [facility]  Onychomycosis Nail Lacquer -  Fluconazole 2%, Terbinafine 1% DMSO/undecylenic acid 25% Apply to affected nail once daily Qty. 120 gm 3 refills  . OXYGEN 2lpm 24/7  . Probiotic Product (PROBIOTIC ADVANCED PO) Take 1 tablet by mouth daily.  . Simethicone 180 MG CAPS Use as needed                      Objective:   Physical Exam      06/06/2020        100 01/18/2020          92 10/16/2019          93  03/05/2019      99  06/13/2018         123  03/14/2018       127  12/09/2017         125   06/10/2017         119  01/08/2017         112   12/06/2016        110   02/15/16 119 lb (54 kg)  08/18/15 137 lb 12.8 oz (62.5 kg)  02/17/15 138 lb 1.9 oz (62.7 kg)    Vital signs reviewed  06/06/2020  - Note at rest 02 sats  94% on 2lpm   General appearance:   Elderly amb wf nad      HEENT : pt wearing mask not removed for exam due to covid -19 concerns.    NECK :  without JVD/Nodes/TM/ nl carotid upstrokes bilaterally   LUNGS: no acc muscle use,  Mod barrel  contour chest wall with bilateral  Distant bs s audible wheeze and  without cough  on insp or exp maneuvers and mod  Hyperresonant  to  percussion bilaterally     CV:  RRR  no s3 or murmur or increase in P2, and no edema   ABD:   soft and nontender with pos mid insp Hoover's  in the supine position. No bruits or organomegaly appreciated, bowel sounds nl  MS:     ext warm without deformities, calf tenderness, cyanosis or clubbing No obvious joint restrictions   SKIN: warm and dry without lesions    NEURO:  alert, approp, nl sensorium with  no motor or cerebellar deficits apparent.          I personally reviewed images and agree with radiology impression as follows:  CXR:   03/28/20 Retraction with volume loss and cicatrization left apex, likely due in large part to previous radiation therapy. No edema or airspace opacity. Stable cardiac silhouette. No adenopathy appreciable.     Assessment:

## 2020-06-07 ENCOUNTER — Encounter: Payer: Self-pay | Admitting: Internal Medicine

## 2020-06-07 NOTE — Assessment & Plan Note (Addendum)
rx 2lpm hs and prn daytime since 2014  -  06/13/2018   Walked on 2lpm x  3 laps @  approx 294ft each @ nl pace  stopped due to  Sob after each lap  s desat     rec as of 06/06/2020  = 2lpm hs and prn with activity with goal of keeping sats > 90%   Again advised:  Make sure you check your oxygen saturation  at your highest level of activity (for her this would be during her daily laps)   to be sure it stays over 90% and adjust  02 flow upward to maintain this level if needed but remember to turn it back to previous settings when you stop (to conserve your supply).          Each maintenance medication was reviewed in detail including emphasizing most importantly the difference between maintenance and prns and under what circumstances the prns are to be triggered using an action plan format where appropriate.  Total time for H and P, chart review, counseling, reviewing hfa  device(s) and generating customized AVS unique to this office visit / same day charting = 23 min

## 2020-06-07 NOTE — Assessment & Plan Note (Signed)
Quit smoking 2009 Spirometry 02/15/2016  FEV1 0.60 (28%)  Ratio 38 with classic curvature  p am symbicort 160  - 01/08/2017    continue dulera 200 2bid  - 12/09/2017  After extensive coaching inhaler device  effectiveness =    90% with smi >add spiriva to  symbicort  - 12/12/2017 informed could not tol spiriva due to dry mouth - - 06/13/2018   continue symbicort 160 - 01/18/2020  After extensive coaching inhaler device,  effectiveness =  90%  - 06/06/2020 changed to breztri      Group D in terms of symptom/risk and laba/lama/ICS  therefore appropriate rx at this point >>>  breztri 2bid and prn saba:  I spent extra time with pt today reviewing appropriate use of albuterol for prn use on exertion with the following points: 1) saba is for relief of sob that does not improve by walking a slower pace or resting but rather if the pt does not improve after trying this first. 2) If the pt is convinced, as many are, that saba helps recover from activity faster then it's easy to tell if this is the case by re-challenging : ie stop, take the inhaler, then p 5 minutes try the exact same activity (intensity of workload) that just caused the symptoms and see if they are substantially diminished or not after saba 3) if there is an activity that reproducibly causes the symptoms, try the saba 15 min before the activity on alternate days   If in fact the saba really does help, then fine to continue to use it prn but advised may need to look closer at the maintenance regimen being used to achieve better control of airways disease with exertion.

## 2020-06-10 ENCOUNTER — Inpatient Hospital Stay: Payer: Medicare Other | Attending: Oncology

## 2020-06-10 ENCOUNTER — Inpatient Hospital Stay (HOSPITAL_BASED_OUTPATIENT_CLINIC_OR_DEPARTMENT_OTHER): Payer: Medicare Other | Admitting: Oncology

## 2020-06-10 ENCOUNTER — Other Ambulatory Visit: Payer: Self-pay

## 2020-06-10 ENCOUNTER — Inpatient Hospital Stay: Payer: Medicare Other

## 2020-06-10 VITALS — BP 136/68 | HR 92 | Temp 97.7°F | Resp 18 | Ht 62.0 in | Wt 99.9 lb

## 2020-06-10 DIAGNOSIS — R21 Rash and other nonspecific skin eruption: Secondary | ICD-10-CM | POA: Diagnosis not present

## 2020-06-10 DIAGNOSIS — C3412 Malignant neoplasm of upper lobe, left bronchus or lung: Secondary | ICD-10-CM | POA: Diagnosis not present

## 2020-06-10 DIAGNOSIS — J440 Chronic obstructive pulmonary disease with acute lower respiratory infection: Secondary | ICD-10-CM | POA: Insufficient documentation

## 2020-06-10 DIAGNOSIS — C7889 Secondary malignant neoplasm of other digestive organs: Secondary | ICD-10-CM | POA: Diagnosis not present

## 2020-06-10 DIAGNOSIS — I252 Old myocardial infarction: Secondary | ICD-10-CM | POA: Diagnosis not present

## 2020-06-10 DIAGNOSIS — I700 Atherosclerosis of aorta: Secondary | ICD-10-CM | POA: Diagnosis not present

## 2020-06-10 DIAGNOSIS — I2510 Atherosclerotic heart disease of native coronary artery without angina pectoris: Secondary | ICD-10-CM | POA: Diagnosis not present

## 2020-06-10 DIAGNOSIS — R490 Dysphonia: Secondary | ICD-10-CM | POA: Insufficient documentation

## 2020-06-10 DIAGNOSIS — Z5112 Encounter for antineoplastic immunotherapy: Secondary | ICD-10-CM | POA: Diagnosis present

## 2020-06-10 DIAGNOSIS — J439 Emphysema, unspecified: Secondary | ICD-10-CM | POA: Diagnosis not present

## 2020-06-10 DIAGNOSIS — Z9981 Dependence on supplemental oxygen: Secondary | ICD-10-CM | POA: Insufficient documentation

## 2020-06-10 DIAGNOSIS — R42 Dizziness and giddiness: Secondary | ICD-10-CM | POA: Insufficient documentation

## 2020-06-10 DIAGNOSIS — Z79899 Other long term (current) drug therapy: Secondary | ICD-10-CM | POA: Insufficient documentation

## 2020-06-10 LAB — CMP (CANCER CENTER ONLY)
ALT: 15 U/L (ref 0–44)
AST: 18 U/L (ref 15–41)
Albumin: 4 g/dL (ref 3.5–5.0)
Alkaline Phosphatase: 54 U/L (ref 38–126)
Anion gap: 9 (ref 5–15)
BUN: 13 mg/dL (ref 8–23)
CO2: 26 mmol/L (ref 22–32)
Calcium: 9.3 mg/dL (ref 8.9–10.3)
Chloride: 104 mmol/L (ref 98–111)
Creatinine: 0.64 mg/dL (ref 0.44–1.00)
GFR, Estimated: >60 mL/min (ref >60)
Glucose, Bld: 102 mg/dL — ABNORMAL HIGH (ref 70–99)
Potassium: 3.9 mmol/L (ref 3.5–5.1)
Sodium: 139 mmol/L (ref 135–145)
Total Bilirubin: 0.6 mg/dL (ref 0.3–1.2)
Total Protein: 6.9 g/dL (ref 6.5–8.1)

## 2020-06-10 LAB — CBC WITH DIFFERENTIAL (CANCER CENTER ONLY)
Abs Immature Granulocytes: 0.01 10*3/uL (ref 0.00–0.07)
Basophils Absolute: 0 10*3/uL (ref 0.0–0.1)
Basophils Relative: 1 %
Eosinophils Absolute: 0.1 10*3/uL (ref 0.0–0.5)
Eosinophils Relative: 2 %
HCT: 40.8 % (ref 36.0–46.0)
Hemoglobin: 13.2 g/dL (ref 12.0–15.0)
Immature Granulocytes: 0 %
Lymphocytes Relative: 14 %
Lymphs Abs: 0.8 10*3/uL (ref 0.7–4.0)
MCH: 29 pg (ref 26.0–34.0)
MCHC: 32.4 g/dL (ref 30.0–36.0)
MCV: 89.7 fL (ref 80.0–100.0)
Monocytes Absolute: 0.4 10*3/uL (ref 0.1–1.0)
Monocytes Relative: 6 %
Neutro Abs: 4.4 10*3/uL (ref 1.7–7.7)
Neutrophils Relative %: 77 %
Platelet Count: 200 10*3/uL (ref 150–400)
RBC: 4.55 MIL/uL (ref 3.87–5.11)
RDW: 13.3 % (ref 11.5–15.5)
WBC Count: 5.7 10*3/uL (ref 4.0–10.5)
nRBC: 0 % (ref 0.0–0.2)

## 2020-06-10 MED ORDER — PEMBROLIZUMAB CHEMO INJECTION 100 MG/4ML
200.0000 mg | Freq: Once | INTRAVENOUS | Status: AC
  Administered 2020-06-10: 200 mg via INTRAVENOUS
  Filled 2020-06-10: qty 8

## 2020-06-10 MED ORDER — SODIUM CHLORIDE 0.9 % IV SOLN
Freq: Once | INTRAVENOUS | Status: AC
  Filled 2020-06-10: qty 250

## 2020-06-10 NOTE — Patient Instructions (Signed)
[name] Discharge Instructions for Patients Receiving Chemotherapy  Today you received the following immunotherapy agent: Pembrolizumab [name]  To help prevent nausea and vomiting after your treatment, we encourage you to take your nausea medication as directed by your MD.   If you develop nausea and vomiting that is not controlled by your nausea medication, call the clinic.   BELOW ARE SYMPTOMS THAT SHOULD BE REPORTED IMMEDIATELY:  *FEVER GREATER THAN 100.5 F  *CHILLS WITH OR WITHOUT FEVER  NAUSEA AND VOMITING THAT IS NOT CONTROLLED WITH YOUR NAUSEA MEDICATION  *UNUSUAL SHORTNESS OF BREATH  *UNUSUAL BRUISING OR BLEEDING  TENDERNESS IN MOUTH AND THROAT WITH OR WITHOUT PRESENCE OF ULCERS  *URINARY PROBLEMS  *BOWEL PROBLEMS  UNUSUAL RASH Items with * indicate a potential emergency and should be followed up as soon as possible.  Feel free to call the clinic should you have any questions or concerns. The clinic phone number is [phone].  Please show the [facility] at check-in to the Emergency Department and triage nurse.

## 2020-06-10 NOTE — Progress Notes (Signed)
[facility] OFFICE PROGRESS NOTE   Diagnosis: Non-small cell lung cancer  INTERVAL HISTORY:   Brooke [name] completed another treatment with pembrolizumab on 05/20/2020.  No rash or diarrhea.  Good appetite.  She saw Dr. Melvyn Novas earlier this week.  She is starting a new inhaler. She developed vertigo approximately 3 weeks ago.  The vertigo has partially improved.  She has decreased hearing and fullness in the left ear.  She has had vertigo in the past.  Hoarseness has partially improved. Objective:  Vital signs in last 24 hours:  Blood pressure 136/68, pulse 92, temperature 97.7 F (36.5 C), temperature source Tympanic, resp. rate 18, height '5\' 2"'  (1.575 m), weight 99 lb 14.4 oz (45.3 kg), SpO2 100 %.    HEENT: Mild cerumen in the left external canal, the tympanic membrane is visible and without exudate, the right tympanic membrane is clear Resp: Bronchial sounds at the left chest, no respiratory distress Cardio: Regular rate and rhythm GI: No hepatosplenomegaly Vascular: No leg edema Neuro: She ambulates to the exam table without difficulty, the extraocular movements appear intact, no nystagmus.  She developed vertigo symptoms when sitting up from a lying position. Skin: No rash   Lab Results:  Lab Results  Component Value Date   WBC 5.7 06/10/2020   HGB 13.2 06/10/2020   HCT 40.8 06/10/2020   MCV 89.7 06/10/2020   PLT 200 06/10/2020   NEUTROABS 4.4 06/10/2020    CMP  Lab Results  Component Value Date   NA 138 05/20/2020   K 4.3 05/20/2020   CL 104 05/20/2020   CO2 27 05/20/2020   GLUCOSE 91 05/20/2020   BUN 14 05/20/2020   CREATININE 0.70 05/20/2020   CALCIUM 9.2 05/20/2020   PROT 6.9 05/20/2020   ALBUMIN 3.7 05/20/2020   AST 16 05/20/2020   ALT 16 05/20/2020   ALKPHOS 56 05/20/2020   BILITOT 0.6 05/20/2020   GFRNONAA >60 05/20/2020   GFRAA >60 02/05/2020    Medications: I have reviewed the patient's current medications.   Assessment/Plan: 1.  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  PDL1 60%  Left lung radiation 11/08/2016 through 11/21/2016  CT chest 12/12/2016-reexpansion of left upper lobe with a decreased left upper lobe mass, unchanged mediastinal adenopathy, progression of a splenic metastasis/pancreatic tail/gastrohepatic ligament metastasis, right lower lobe pneumonia  Cycle 1 Pembrolizumab 12/14/2016  Cycle 2 Pembrolizumab 01/03/2017  Cycle 3 Pembrolizumab 01/24/2017  Cycle 4 Pembrolizumab 02/12/2017  CT 03/05/2018-decrease in left upper lobe mass, mediastinal adenopathy, and splenic mass  Cycle 5 pembrolizumab 03/07/2017  Cycle 6 Pembrolizumab 04/02/2017  Cycle 7 pembrolizumab 04/25/2017  Cycle 8 Pembrolizumab 05/14/2017  Cycle 9 Pembrolizumab 06/06/2017  CT 06/20/2017-mild decrease in left upper lobe mass, mild increase in paramediastinal left upper lobe nodule-radiation change?, decreased splenic metastasis  Cycle 10 pembrolizumab 06/25/2017  Cycle 11 pembrolizumab 07/18/2017  Cycle 12 pembrolizumab 08/29/2017  Cycle 13 pembrolizumab 09/17/2017  Cycle 14 Pembrolizumab 10/10/2017  CT chest 10/24/2017-slight enlargement of small mediastinal lymph nodes, increased left upper lobe consolidation, decreased size of spleen lesion  Cycle 15 pembrolizumab 10/29/2017  Cycle 16 Pembrolizumab 11/21/2017  Cycle 17 Pembrolizumab 12/11/2017  Cycle 18 pembrolizumab  01/02/2018  Cycle 19 Pembrolizumab 01/21/2018  Cycle 20 Pembrolizumab 02/13/2018  CT chest 02/27/2018-decreased left paratracheal node, progressive  consolidation/fibrosis in the left upper lung, decreased size of splenic mass  Cycle 21 pembrolizumab 03/04/2018  Cycle 22 Pembrolizumab 03/27/2018  Cycle 23 pembrolizumab 04/15/2018  Cycle 24 pembrolizumab 05/08/2018  Cycle 25 Pembrolizumab 05/27/2018  CT chest 06/16/2018-stable left upper lung radiation fibrosis with no evidence of local tumor recurrence. Decreased left paratracheal adenopathy. No new or progressive metastatic disease in the chest. Gastrohepatic ligament lymphadenopathy stable. Splenic metastasis decreased. T5 vertebral compression fracture with associated patchy sclerosis and no discrete osseous lesion, new in the interval.  Cycle 26 Pembrolizumab 06/19/2018  Cycle 27 pembrolizumab 07/08/2018  Cycle 28 pembrolizumab 07/31/2018  Cycle 29 pembrolizumab 08/19/2018  Cycle 30 Pembrolizumab 09/11/2018  Cycle 31 pembrolizumab 09/30/2018  Cycle 32 pembrolizumab 10/23/2018  CT chest 10/28/2018-left apical pleural-parenchymal opacity consistent with radiation scarring has become more confluent likely representing evolutionary change. Continued further decrease in size of index left paratracheal node and splenic metastasis. Gastrohepatic ligament lymph node not changed.  Cycle 33 Pembrolizumab 11/11/2018  Cycle 34 pembrolizumab 12/04/2018  Cycle 35 pembrolizumab 12/23/2018  CT chest 02/12/2019-posttreatment changes left upper lobe unchanged. Continued decrease in size of splenic metastasis. Stable appearance of left paratracheal lymph nodes. Stable gastrohepatic adenopathy. Incomplete visualization of retroperitoneal lymph nodes in the upper abdomen.  CT chest 07/02/2019-stable post treatment related changes left lung. 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 Slight regression of enlarged likely metastatic gastrohepatic lymph node.  CT chest 11/11/2019-interval enlargement of a right paratracheal lymph node measuring 14 mm, increased from 5 mm. No new lung mass or  nodularity. New adenopathy in the upper abdomen. 2.7 cm lesion gastrohepatic ligament. Necrotic node at the splenic hilum measures 4.7 cm. Large node along the IVC left upper quadrant measures 2.7 cm. Similar node left adjacent the aorta measures 1.8 cm. Enhancing lesion in the spleen is unchanged. Several low-density lesions in the liver are unchanged.  Pembrolizumab resumed on a 3-week schedule 12/03/2019  CT chest 02/22/2020-interval resolution of previously demonstrated left paratracheal and upper abdominal adenopathy. Stable treated metastasis within the spleen. Stable radiation changes in the left lung with left hilar distortion. No evidence of local recurrence or progressive metastatic disease.  Continuation of pembrolizumab every 3 weeks 02/26/2020  2. 05/29/2019 laryngoscopy-exophytic appearing mass mid right vocal cord.   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 Tumor cells negative for p16, CMV and HSV 2. 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   Radiation 07/29/2019-08/25/2019  3. History of acute respiratory failure secondary to #1  4. Oxygen dependent COPD  5. History of a NSTEMI 2015  6. Right lung pneumonia on chest CT 12/12/2016-treated with Levaquin  7.Grade 2 rash possibly related to Pembrolizumab. Treatment held 08/06/2017.Improved 08/29/2017, treatment resumed, rash has resolved  8.  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  She continues treatment with pembrolizumab.  She will complete another treatment today.  She will undergo restaging CT evaluation prior to an office visit in 3 weeks.  She has developed vertigo over the past few weeks.  She most likely has benign positional vertigo.  We will refer her for a neurology evaluation and CNS imaging if the vertigo symptoms persist.    Betsy Coder, MD  06/10/2020  9:39 AM

## 2020-06-27 ENCOUNTER — Ambulatory Visit (HOSPITAL_COMMUNITY): Payer: Medicare Other

## 2020-06-28 ENCOUNTER — Ambulatory Visit (HOSPITAL_COMMUNITY)
Admission: RE | Admit: 2020-06-28 | Discharge: 2020-06-28 | Disposition: A | Discharge status: Home / Self Care | Payer: Medicare Other | Source: Ambulatory Visit | Attending: Oncology | Admitting: Oncology

## 2020-06-28 ENCOUNTER — Other Ambulatory Visit: Payer: Self-pay

## 2020-06-28 DIAGNOSIS — C3412 Malignant neoplasm of upper lobe, left bronchus or lung: Secondary | ICD-10-CM | POA: Insufficient documentation

## 2020-06-28 MED ORDER — IOHEXOL 300 MG/ML  SOLN
75.0000 mL | Freq: Once | INTRAMUSCULAR | Status: AC | PRN
  Administered 2020-06-28: 75 mL via INTRAVENOUS

## 2020-07-01 ENCOUNTER — Other Ambulatory Visit: Payer: Self-pay

## 2020-07-01 ENCOUNTER — Inpatient Hospital Stay (HOSPITAL_BASED_OUTPATIENT_CLINIC_OR_DEPARTMENT_OTHER): Payer: Medicare Other | Admitting: Oncology

## 2020-07-01 ENCOUNTER — Inpatient Hospital Stay: Payer: Medicare Other

## 2020-07-01 ENCOUNTER — Telehealth: Payer: Self-pay | Admitting: Oncology

## 2020-07-01 VITALS — BP 142/88 | HR 91 | Temp 97.6°F | Resp 18 | Ht 62.0 in | Wt 99.5 lb

## 2020-07-01 DIAGNOSIS — C3412 Malignant neoplasm of upper lobe, left bronchus or lung: Secondary | ICD-10-CM

## 2020-07-01 LAB — CMP (CANCER CENTER ONLY)
ALT: 15 U/L (ref 0–44)
AST: 16 U/L (ref 15–41)
Albumin: 4.1 g/dL (ref 3.5–5.0)
Alkaline Phosphatase: 53 U/L (ref 38–126)
Anion gap: 8 (ref 5–15)
BUN: 15 mg/dL (ref 8–23)
CO2: 27 mmol/L (ref 22–32)
Calcium: 9.5 mg/dL (ref 8.9–10.3)
Chloride: 102 mmol/L (ref 98–111)
Creatinine: 0.62 mg/dL (ref 0.44–1.00)
GFR, Estimated: >60 mL/min (ref >60)
Glucose, Bld: 96 mg/dL (ref 70–99)
Potassium: 4.4 mmol/L (ref 3.5–5.1)
Sodium: 137 mmol/L (ref 135–145)
Total Bilirubin: 0.5 mg/dL (ref 0.3–1.2)
Total Protein: 7.3 g/dL (ref 6.5–8.1)

## 2020-07-01 LAB — CBC WITH DIFFERENTIAL (CANCER CENTER ONLY)
Abs Immature Granulocytes: 0.01 10*3/uL (ref 0.00–0.07)
Basophils Absolute: 0 10*3/uL (ref 0.0–0.1)
Basophils Relative: 0 %
Eosinophils Absolute: 0.1 10*3/uL (ref 0.0–0.5)
Eosinophils Relative: 3 %
HCT: 41.8 % (ref 36.0–46.0)
Hemoglobin: 13.4 g/dL (ref 12.0–15.0)
Immature Granulocytes: 0 %
Lymphocytes Relative: 22 %
Lymphs Abs: 1 10*3/uL (ref 0.7–4.0)
MCH: 28.9 pg (ref 26.0–34.0)
MCHC: 32.1 g/dL (ref 30.0–36.0)
MCV: 90.3 fL (ref 80.0–100.0)
Monocytes Absolute: 0.4 10*3/uL (ref 0.1–1.0)
Monocytes Relative: 8 %
Neutro Abs: 3.1 10*3/uL (ref 1.7–7.7)
Neutrophils Relative %: 67 %
Platelet Count: 206 10*3/uL (ref 150–400)
RBC: 4.63 MIL/uL (ref 3.87–5.11)
RDW: 13.1 % (ref 11.5–15.5)
WBC Count: 4.6 10*3/uL (ref 4.0–10.5)
nRBC: 0 % (ref 0.0–0.2)

## 2020-07-01 LAB — TSH: TSH: 1.353 u[IU]/mL (ref 0.308–3.960)

## 2020-07-01 MED ORDER — SODIUM CHLORIDE 0.9 % IV SOLN
Freq: Once | INTRAVENOUS | Status: AC
  Filled 2020-07-01: qty 250

## 2020-07-01 MED ORDER — SODIUM CHLORIDE 0.9 % IV SOLN
200.0000 mg | Freq: Once | INTRAVENOUS | Status: AC
  Administered 2020-07-01: 200 mg via INTRAVENOUS
  Filled 2020-07-01: qty 8

## 2020-07-01 NOTE — Addendum Note (Signed)
Addended by: Betsy Coder B on: 07/01/2020 11:08 AM   Modules accepted: Orders

## 2020-07-01 NOTE — Patient Instructions (Signed)
[facility] Discharge Instructions for Patients Receiving Chemotherapy  Today you received the following immunotherapy agent: Pembrolizumab [name]  To help prevent nausea and vomiting after your treatment, we encourage you to take your nausea medication as directed by your MD.   If you develop nausea and vomiting that is not controlled by your nausea medication, call the clinic.   BELOW ARE SYMPTOMS THAT SHOULD BE REPORTED IMMEDIATELY:  *FEVER GREATER THAN 100.5 F  *CHILLS WITH OR WITHOUT FEVER  NAUSEA AND VOMITING THAT IS NOT CONTROLLED WITH YOUR NAUSEA MEDICATION  *UNUSUAL SHORTNESS OF BREATH  *UNUSUAL BRUISING OR BLEEDING  TENDERNESS IN MOUTH AND THROAT WITH OR WITHOUT PRESENCE OF ULCERS  *URINARY PROBLEMS  *BOWEL PROBLEMS  UNUSUAL RASH Items with * indicate a potential emergency and should be followed up as soon as possible.  Feel free to call the clinic should you have any questions or concerns. The clinic phone number is [phone].  Please show the [facility] at check-in to the Emergency Department and triage nurse.

## 2020-07-01 NOTE — Telephone Encounter (Signed)
Checked out appointment. No 2/25 los needing to be scheduled. No changes made.

## 2020-07-01 NOTE — Progress Notes (Signed)
[facility] OFFICE PROGRESS NOTE   Diagnosis: Non-small cell lung cancer  INTERVAL HISTORY:   Brooke [name] completed another treatment of pembrolizumab on 06/10/2020.  No rash or diarrhea.  She continues to have vertigo.  Exercises have not helped.  Objective:  Vital signs in last 24 hours:  Blood pressure (!) 142/88, pulse 91, temperature 97.6 F (36.4 C), temperature source Tympanic, resp. rate 18, height _0  (1.575 m), weight 99 lb 8 oz (45.1 kg), SpO2 97 %.  Resp: Bronchial sounds at the left upper posterior chest, no respiratory distress Cardio: Regular rate and rhythm GI: No hepatosplenomegaly Vascular: No leg edema  Skin: No rash    Lab Results:  Lab Results  Component Value Date   WBC 4.6 07/01/2020   HGB 13.4 07/01/2020   HCT 41.8 07/01/2020   MCV 90.3 07/01/2020   PLT 206 07/01/2020   NEUTROABS 3.1 07/01/2020    CMP  Lab Results  Component Value Date   NA 137 07/01/2020   K 4.4 07/01/2020   CL 102 07/01/2020   CO2 27 07/01/2020   GLUCOSE 96 07/01/2020   BUN 15 07/01/2020   CREATININE 0.62 07/01/2020   CALCIUM 9.5 07/01/2020   PROT 7.3 07/01/2020   ALBUMIN 4.1 07/01/2020   AST 16 07/01/2020   ALT 15 07/01/2020   ALKPHOS 53 07/01/2020   BILITOT 0.5 07/01/2020   GFRNONAA >60 07/01/2020   GFRAA >60 02/05/2020    No results found for: CEA1  Lab Results  Component Value Date   INR 0.99 11/01/2016    Imaging:  CT CHEST W CONTRAST  Result Date: 06/29/2020 CLINICAL DATA:  Primary Cancer Type: Lung Imaging Indication: Routine surveillance Interval therapy since last imaging? 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 Primary Tumor location: Left upper lobe.  Splenic metastasis. Surgeries: No. Chemotherapy: No Immunotherapy?  Yes; Type: Pembrolizumab; Ongoing? Yes Radiation therapy? Yes Date Range:  11/08/2016 - 11/21/2016; Target: Left lung Date Range: 07/29/2019 - 08/25/2019; Target: Larynx EXAM: CT CHEST WITH CONTRAST TECHNIQUE: Multidetector CT imaging of the chest was performed during intravenous contrast administration. CONTRAST:  61m OMNIPAQUE IOHEXOL 300 MG/ML  SOLN COMPARISON:  Most recent CT chest 02/22/2020.  02/28/2016 PET-CT. FINDINGS: Cardiovascular: Aortic atherosclerosis. Normal heart size. Three-vessel coronary artery calcifications. No pericardial effusion. Mediastinum/Nodes: Unchanged post treatment appearance of soft tissue about the left hilum. No discretely enlarged mediastinal, hilar, or axillary lymph nodes. Thyroid gland, trachea, and esophagus demonstrate no significant findings. Lungs/Pleura: Severe centrilobular emphysema. Unchanged post treatment appearance of the left upper lobe, with very dense, fibrotic consolidation and volume loss of the left hemithorax. No pleural effusion or pneumothorax. Upper Abdomen: No acute abnormality. Unchanged appearance of treated metastatic lesion of the spleen measuring approximately 2.7 x 2.3 cm (series 2, image 117). Musculoskeletal: No chest wall mass or suspicious bone lesions identified. IMPRESSION: 1. Unchanged post treatment appearance of the left upper lobe, with very dense, fibrotic consolidation and volume loss of the left hemithorax. 2. Unchanged post treatment appearance of soft tissue about the left hilum. No discretely enlarged mediastinal or hilar lymph nodes. 3. Unchanged appearance of treated metastatic lesion of the spleen. 4. Emphysema. 5. Coronary artery disease. Aortic Atherosclerosis (ICD10-I70.0) and Emphysema (ICD10-J43.9). Electronically Signed   By: AEddie CandleM.D.   On: 06/29/2020 11:13    Medications: I have reviewed the patient's current medications.   Assessment/Plan: . 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  PDL1 60%  Left lung radiation 11/08/2016 through 11/21/2016  CT chest 12/12/2016-reexpansion of left upper lobe with a decreased left upper lobe mass, unchanged mediastinal adenopathy, progression of a splenic metastasis/pancreatic tail/gastrohepatic ligament metastasis, right lower lobe pneumonia  Cycle 1 Pembrolizumab 12/14/2016  Cycle 2 Pembrolizumab 01/03/2017  Cycle 3 Pembrolizumab 01/24/2017  Cycle 4 Pembrolizumab 02/12/2017  CT 03/05/2018-decrease in left upper lobe mass, mediastinal adenopathy, and splenic mass  Cycle 5 pembrolizumab 03/07/2017  Cycle 6 Pembrolizumab 04/02/2017  Cycle 7 pembrolizumab 04/25/2017  Cycle 8 Pembrolizumab 05/14/2017  Cycle 9 Pembrolizumab 06/06/2017  CT 06/20/2017-mild decrease in left upper lobe mass, mild increase in paramediastinal left upper lobe nodule-radiation change?, decreased splenic metastasis  Cycle 10 pembrolizumab 06/25/2017  Cycle 11 pembrolizumab 07/18/2017  Cycle 12 pembrolizumab 08/29/2017  Cycle 13 pembrolizumab 09/17/2017  Cycle 14 Pembrolizumab 10/10/2017  CT chest 10/24/2017-slight enlargement of small mediastinal lymph nodes, increased left upper lobe consolidation, decreased size of spleen lesion  Cycle 15 pembrolizumab 10/29/2017  Cycle 16 Pembrolizumab 11/21/2017  Cycle 17 Pembrolizumab 12/11/2017  Cycle 18 pembrolizumab 01/02/2018  Cycle 19 Pembrolizumab 01/21/2018  Cycle 20 Pembrolizumab 02/13/2018  CT chest 02/27/2018-decreased left paratracheal node, progressive consolidation/fibrosis in the left upper lung, decreased size of splenic mass  Cycle 21 pembrolizumab 03/04/2018  Cycle 22 Pembrolizumab  03/27/2018  Cycle 23 pembrolizumab 04/15/2018  Cycle 24 pembrolizumab 05/08/2018  Cycle 25 Pembrolizumab 05/27/2018  CT chest 06/16/2018-stable left upper lung radiation fibrosis with no evidence of local tumor recurrence. Decreased left paratracheal adenopathy. No new or progressive metastatic disease in the chest. Gastrohepatic ligament lymphadenopathy stable. Splenic metastasis decreased. T5 vertebral compression fracture with associated patchy sclerosis and no discrete osseous lesion, new in the interval.  Cycle 26 Pembrolizumab 06/19/2018  Cycle 27 pembrolizumab 07/08/2018  Cycle 28 pembrolizumab 07/31/2018  Cycle 29 pembrolizumab 08/19/2018  Cycle 30 Pembrolizumab 09/11/2018  Cycle 31 pembrolizumab 09/30/2018  Cycle 32 pembrolizumab 10/23/2018  CT chest 10/28/2018-left apical pleural-parenchymal opacity consistent with radiation scarring has become more confluent likely representing evolutionary change. Continued further decrease in size of index left paratracheal node and splenic metastasis. Gastrohepatic ligament lymph node not changed.  Cycle 33 Pembrolizumab 11/11/2018  Cycle 34 pembrolizumab 12/04/2018  Cycle 35 pembrolizumab 12/23/2018  CT chest 02/12/2019-posttreatment changes left upper lobe unchanged. Continued decrease in size of splenic metastasis. Stable appearance of left paratracheal lymph nodes. Stable gastrohepatic adenopathy. Incomplete visualization of retroperitoneal lymph nodes in the upper abdomen.  CT chest 07/02/2019-stable post treatment related changes left lung. 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 Slight regression of enlarged likely metastatic gastrohepatic lymph node.  CT chest 11/11/2019-interval enlargement of a right paratracheal lymph node measuring 14 mm, increased from 5 mm. No new lung mass or nodularity. New adenopathy in the upper abdomen. 2.7 cm lesion gastrohepatic ligament. Necrotic node at the splenic hilum measures 4.7 cm. Large  node along the IVC left upper quadrant measures 2.7 cm. Similar node left adjacent the aorta measures 1.8 cm. Enhancing lesion in the spleen is unchanged. Several low-density lesions in the liver are unchanged.  Pembrolizumab resumed on a 3-week schedule 12/03/2019  CT chest 02/22/2020-interval resolution of previously demonstrated left paratracheal and  upper abdominal adenopathy. Stable treated metastasis within the spleen. Stable radiation changes in the left lung with left hilar distortion. No evidence of local recurrence or progressive metastatic disease.  Continuation of pembrolizumab every 3 weeks 02/26/2020  CT chest 06/28/2020-no change in left upper lobe consolidation, unchanged spleen metastasis, no evidence of progressive disease  Pembrolizumab continued every 3 weeks 07/01/2020  2. 05/29/2019 laryngoscopy-exophytic appearing mass mid right vocal cord.   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 Tumor cells negative for p16, CMV and HSV 2. 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   Radiation 07/29/2019-08/25/2019  3. History of acute respiratory failure secondary to #1  4. Oxygen dependent COPD  5. History of a NSTEMI 2015  6. Right lung pneumonia on chest CT 12/12/2016-treated with Levaquin  7.Grade 2 rash possibly related to Pembrolizumab. Treatment held 08/06/2017.Improved 08/29/2017, treatment resumed, rash has resolved  8.  Vertigo January 2022       Disposition: Brooke [name] appears stable.  The restaging CT shows no evidence of disease progression.  She will continue pembrolizumab.  She is tolerating the pembrolizumab well.  We discussed changing to a 6-week schedule.  She would like to stay on pembrolizumab every 3 weeks.  I reviewed the CT images with Brooke [name].  She will see ENT next week for evaluation and treatment of vertigo.  She will return for an office visit and pembrolizumab in 3  weeks.  Betsy Coder, MD  07/01/2020  10:09 AM

## 2020-07-17 ENCOUNTER — Other Ambulatory Visit: Payer: Self-pay | Admitting: Oncology

## 2020-07-18 ENCOUNTER — Ambulatory Visit: Payer: Medicare Other | Admitting: Internal Medicine

## 2020-07-22 ENCOUNTER — Other Ambulatory Visit: Payer: Self-pay

## 2020-07-22 ENCOUNTER — Inpatient Hospital Stay (HOSPITAL_BASED_OUTPATIENT_CLINIC_OR_DEPARTMENT_OTHER): Payer: Medicare Other | Admitting: Oncology

## 2020-07-22 ENCOUNTER — Inpatient Hospital Stay: Payer: Medicare Other | Attending: Oncology

## 2020-07-22 ENCOUNTER — Inpatient Hospital Stay: Payer: Medicare Other

## 2020-07-22 VITALS — BP 150/87 | HR 88 | Temp 98.2°F | Resp 18 | Ht 62.0 in | Wt 100.2 lb

## 2020-07-22 DIAGNOSIS — Z79899 Other long term (current) drug therapy: Secondary | ICD-10-CM | POA: Insufficient documentation

## 2020-07-22 DIAGNOSIS — C3412 Malignant neoplasm of upper lobe, left bronchus or lung: Secondary | ICD-10-CM | POA: Insufficient documentation

## 2020-07-22 DIAGNOSIS — Z9981 Dependence on supplemental oxygen: Secondary | ICD-10-CM | POA: Insufficient documentation

## 2020-07-22 DIAGNOSIS — C7889 Secondary malignant neoplasm of other digestive organs: Secondary | ICD-10-CM | POA: Diagnosis not present

## 2020-07-22 DIAGNOSIS — Z5112 Encounter for antineoplastic immunotherapy: Secondary | ICD-10-CM | POA: Insufficient documentation

## 2020-07-22 DIAGNOSIS — J440 Chronic obstructive pulmonary disease with acute lower respiratory infection: Secondary | ICD-10-CM | POA: Diagnosis not present

## 2020-07-22 DIAGNOSIS — I252 Old myocardial infarction: Secondary | ICD-10-CM | POA: Insufficient documentation

## 2020-07-22 LAB — CMP (CANCER CENTER ONLY)
ALT: 15 U/L (ref 0–44)
AST: 16 U/L (ref 15–41)
Albumin: 3.9 g/dL (ref 3.5–5.0)
Alkaline Phosphatase: 58 U/L (ref 38–126)
Anion gap: 8 (ref 5–15)
BUN: 12 mg/dL (ref 8–23)
CO2: 28 mmol/L (ref 22–32)
Calcium: 9.2 mg/dL (ref 8.9–10.3)
Chloride: 103 mmol/L (ref 98–111)
Creatinine: 0.62 mg/dL (ref 0.44–1.00)
GFR, Estimated: >60 mL/min (ref >60)
Glucose, Bld: 98 mg/dL (ref 70–99)
Potassium: 3.7 mmol/L (ref 3.5–5.1)
Sodium: 139 mmol/L (ref 135–145)
Total Bilirubin: 0.5 mg/dL (ref 0.3–1.2)
Total Protein: 7.1 g/dL (ref 6.5–8.1)

## 2020-07-22 MED ORDER — SODIUM CHLORIDE 0.9 % IV SOLN
Freq: Once | INTRAVENOUS | Status: AC
  Filled 2020-07-22: qty 250

## 2020-07-22 MED ORDER — SODIUM CHLORIDE 0.9 % IV SOLN
200.0000 mg | Freq: Once | INTRAVENOUS | Status: AC
  Administered 2020-07-22: 200 mg via INTRAVENOUS
  Filled 2020-07-22: qty 8

## 2020-07-22 NOTE — Progress Notes (Signed)
[place] OFFICE PROGRESS NOTE   Diagnosis: Non-small cell lung cancer  INTERVAL HISTORY:   Brooke [name] completed another cycle of pembrolizumab on 07/01/2020.  No rash or diarrhea.  She continues to have vertigo.  She is scheduled for an appointment for vestibular therapy on 08/08/2020.  No increase in dyspnea.  She feels the left ear is "blocked ".  Her ears have been evaluated by ENT.  It is difficult for her to cook due to vertigo.  Objective:  Vital signs in last 24 hours:  Blood pressure (!) 150/87, pulse 88, temperature 98.2 F (36.8 C), temperature source Tympanic, resp. rate 18, height '5\' 2"'  (1.575 m), weight 100 lb 3.2 oz (45.5 kg), SpO2 100 %.    HEENT: The external canals are clear aside from a small amount of cerumen on the left.  The tympanic membranes were visualized Resp: Distant breath sounds, mild end expiratory wheeze at the upper anterior chest bilaterally Cardio: Regular rate and rhythm GI: No hepatosplenomegaly, no mass, nontender Vascular: No leg edema  Skin: No rash   Lab Results:  Lab Results  Component Value Date   WBC 4.6 07/01/2020   HGB 13.4 07/01/2020   HCT 41.8 07/01/2020   MCV 90.3 07/01/2020   PLT 206 07/01/2020   NEUTROABS 3.1 07/01/2020    CMP  Lab Results  Component Value Date   NA 137 07/01/2020   K 4.4 07/01/2020   CL 102 07/01/2020   CO2 27 07/01/2020   GLUCOSE 96 07/01/2020   BUN 15 07/01/2020   CREATININE 0.62 07/01/2020   CALCIUM 9.5 07/01/2020   PROT 7.3 07/01/2020   ALBUMIN 4.1 07/01/2020   AST 16 07/01/2020   ALT 15 07/01/2020   ALKPHOS 53 07/01/2020   BILITOT 0.5 07/01/2020   GFRNONAA >60 07/01/2020   GFRAA >60 02/05/2020     Medications: I have reviewed the patient's current medications.   Assessment/Plan:  1. 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  PDL1 60%  Left lung radiation 11/08/2016 through 11/21/2016  CT chest 12/12/2016-reexpansion of left upper lobe with a decreased left upper lobe mass, unchanged mediastinal adenopathy, progression of a splenic metastasis/pancreatic tail/gastrohepatic ligament metastasis, right lower lobe pneumonia  Cycle 1 Pembrolizumab 12/14/2016  Cycle 2 Pembrolizumab 01/03/2017  Cycle 3 Pembrolizumab 01/24/2017  Cycle 4 Pembrolizumab 02/12/2017  CT 03/05/2018-decrease in left upper lobe mass, mediastinal adenopathy, and splenic mass  Cycle 5 pembrolizumab 03/07/2017  Cycle 6 Pembrolizumab 04/02/2017  Cycle 7 pembrolizumab 04/25/2017  Cycle 8 Pembrolizumab 05/14/2017  Cycle 9 Pembrolizumab 06/06/2017  CT 06/20/2017-mild decrease in left upper lobe mass, mild increase in paramediastinal left upper lobe nodule-radiation change?, decreased splenic metastasis  Cycle 10 pembrolizumab 06/25/2017  Cycle 11 pembrolizumab 07/18/2017  Cycle 12 pembrolizumab 08/29/2017  Cycle 13 pembrolizumab 09/17/2017  Cycle 14 Pembrolizumab 10/10/2017  CT chest 10/24/2017-slight enlargement of small mediastinal lymph nodes, increased left upper lobe consolidation, decreased size of spleen lesion  Cycle 15 pembrolizumab 10/29/2017  Cycle 16 Pembrolizumab 11/21/2017  Cycle 17 Pembrolizumab 12/11/2017  Cycle 18 pembrolizumab 01/02/2018  Cycle 19 Pembrolizumab 01/21/2018  Cycle 20 Pembrolizumab 02/13/2018  CT chest 02/27/2018-decreased left paratracheal node, progressive consolidation/fibrosis  in the left upper lung, decreased size of splenic mass  Cycle 21 pembrolizumab 03/04/2018  Cycle 22 Pembrolizumab 03/27/2018  Cycle 23 pembrolizumab  04/15/2018  Cycle 24 pembrolizumab 05/08/2018  Cycle 25 Pembrolizumab 05/27/2018  CT chest 06/16/2018-stable left upper lung radiation fibrosis with no evidence of local tumor recurrence. Decreased left paratracheal adenopathy. No new or progressive metastatic disease in the chest. Gastrohepatic ligament lymphadenopathy stable. Splenic metastasis decreased. T5 vertebral compression fracture with associated patchy sclerosis and no discrete osseous lesion, new in the interval.  Cycle 26 Pembrolizumab 06/19/2018  Cycle 27 pembrolizumab 07/08/2018  Cycle 28 pembrolizumab 07/31/2018  Cycle 29 pembrolizumab 08/19/2018  Cycle 30 Pembrolizumab 09/11/2018  Cycle 31 pembrolizumab 09/30/2018  Cycle 32 pembrolizumab 10/23/2018  CT chest 10/28/2018-left apical pleural-parenchymal opacity consistent with radiation scarring has become more confluent likely representing evolutionary change. Continued further decrease in size of index left paratracheal node and splenic metastasis. Gastrohepatic ligament lymph node not changed.  Cycle 33 Pembrolizumab 11/11/2018  Cycle 34 pembrolizumab 12/04/2018  Cycle 35 pembrolizumab 12/23/2018  CT chest 02/12/2019-posttreatment changes left upper lobe unchanged. Continued decrease in size of splenic metastasis. Stable appearance of left paratracheal lymph nodes. Stable gastrohepatic adenopathy. Incomplete visualization of retroperitoneal lymph nodes in the upper abdomen.  CT chest 07/02/2019-stable post treatment related changes left lung. 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 Slight regression of enlarged likely metastatic gastrohepatic lymph node.  CT chest 11/11/2019-interval enlargement of a right paratracheal lymph node measuring 14 mm, increased from 5 mm. No new lung mass or nodularity. New adenopathy in the upper abdomen. 2.7 cm lesion gastrohepatic ligament. Necrotic node at the splenic hilum measures 4.7 cm. Large node along the IVC left upper  quadrant measures 2.7 cm. Similar node left adjacent the aorta measures 1.8 cm. Enhancing lesion in the spleen is unchanged. Several low-density lesions in the liver are unchanged.  Pembrolizumab resumed on a 3-week schedule 12/03/2019  CT chest 02/22/2020-interval resolution of previously demonstrated left paratracheal and upper abdominal adenopathy. Stable treated metastasis within the spleen. Stable radiation changes in the left lung with left hilar distortion. No evidence of local recurrence or progressive metastatic disease.  Continuation of pembrolizumab every 3 weeks 02/26/2020  CT chest 06/28/2020-no change in left upper lobe consolidation, unchanged spleen metastasis, no evidence of progressive disease  Pembrolizumab continued every 3 weeks 07/01/2020   2. 05/29/2019 laryngoscopy-exophytic appearing mass mid right vocal cord.   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 Tumor cells negative for p16, CMV and HSV 2. 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   Radiation 07/29/2019-08/25/2019  3. History of acute respiratory failure secondary to #1  4. Oxygen dependent COPD  5. History of a NSTEMI 2015  6. Right lung pneumonia on chest CT 12/12/2016-treated with Levaquin  7.Grade 2 rash possibly related to Pembrolizumab. Treatment held 08/06/2017.Improved 08/29/2017, treatment resumed, rash has resolved  8.  Vertigo January 2022      Disposition: Brooke [name] appears stable.  No clinical evidence for progression of lung cancer.  She will continue every 3-week pembrolizumab.  She will complete another treatment today.  She is scheduled for an office visit in the next cycle of pembrolizumab in 3 weeks.  She will continue follow-up with ENT and physical therapy for management of vertigo.  Betsy Coder, MD  07/22/2020  9:33 AM

## 2020-07-22 NOTE — Patient Instructions (Signed)
[facility] Discharge Instructions for Patients Receiving Chemotherapy  Today you received the following chemotherapy agents:  Keytruda.  To help prevent nausea and vomiting after your treatment, we encourage you to take your nausea medication as directed.   If you develop nausea and vomiting that is not controlled by your nausea medication, call the clinic.   BELOW ARE SYMPTOMS THAT SHOULD BE REPORTED IMMEDIATELY:  *FEVER GREATER THAN 100.5 F  *CHILLS WITH OR WITHOUT FEVER  NAUSEA AND VOMITING THAT IS NOT CONTROLLED WITH YOUR NAUSEA MEDICATION  *UNUSUAL SHORTNESS OF BREATH  *UNUSUAL BRUISING OR BLEEDING  TENDERNESS IN MOUTH AND THROAT WITH OR WITHOUT PRESENCE OF ULCERS  *URINARY PROBLEMS  *BOWEL PROBLEMS  UNUSUAL RASH Items with * indicate a potential emergency and should be followed up as soon as possible.  Feel free to call the clinic should you have any questions or concerns. The clinic phone number is [phone].  Please show the CHEMO ALERT CARD at check-in to the Emergency Department and triage nurse.    

## 2020-07-22 NOTE — Progress Notes (Signed)
Per [name] no CBC needed for treatment today.

## 2020-08-01 ENCOUNTER — Telehealth: Payer: Self-pay | Admitting: Oncology

## 2020-08-01 NOTE — Telephone Encounter (Signed)
I have mailed and updated appointment letter & calendar to patient advising of the new location for Dr Benay Spice & [name], APP.

## 2020-08-06 ENCOUNTER — Other Ambulatory Visit: Payer: Self-pay | Admitting: Oncology

## 2020-08-12 ENCOUNTER — Other Ambulatory Visit: Payer: Self-pay | Admitting: Physician Assistant

## 2020-08-12 ENCOUNTER — Inpatient Hospital Stay: Payer: Medicare Other

## 2020-08-12 ENCOUNTER — Inpatient Hospital Stay: Payer: Medicare Other | Attending: Oncology | Admitting: Nurse Practitioner

## 2020-08-12 ENCOUNTER — Encounter: Payer: Self-pay | Admitting: Nurse Practitioner

## 2020-08-12 ENCOUNTER — Other Ambulatory Visit: Payer: Self-pay

## 2020-08-12 ENCOUNTER — Other Ambulatory Visit: Payer: Medicare Other

## 2020-08-12 ENCOUNTER — Ambulatory Visit: Payer: Medicare Other

## 2020-08-12 VITALS — BP 110/68 | HR 81 | Temp 98.4°F | Resp 16

## 2020-08-12 VITALS — BP 149/86 | HR 86 | Temp 97.8°F | Resp 18 | Ht 62.0 in | Wt 99.0 lb

## 2020-08-12 DIAGNOSIS — C3412 Malignant neoplasm of upper lobe, left bronchus or lung: Secondary | ICD-10-CM

## 2020-08-12 DIAGNOSIS — R42 Dizziness and giddiness: Secondary | ICD-10-CM | POA: Diagnosis not present

## 2020-08-12 DIAGNOSIS — J440 Chronic obstructive pulmonary disease with acute lower respiratory infection: Secondary | ICD-10-CM | POA: Insufficient documentation

## 2020-08-12 DIAGNOSIS — I252 Old myocardial infarction: Secondary | ICD-10-CM | POA: Diagnosis not present

## 2020-08-12 DIAGNOSIS — Z79899 Other long term (current) drug therapy: Secondary | ICD-10-CM | POA: Insufficient documentation

## 2020-08-12 DIAGNOSIS — Z5112 Encounter for antineoplastic immunotherapy: Secondary | ICD-10-CM | POA: Insufficient documentation

## 2020-08-12 DIAGNOSIS — C252 Malignant neoplasm of tail of pancreas: Secondary | ICD-10-CM | POA: Insufficient documentation

## 2020-08-12 DIAGNOSIS — Z9981 Dependence on supplemental oxygen: Secondary | ICD-10-CM | POA: Insufficient documentation

## 2020-08-12 DIAGNOSIS — C7889 Secondary malignant neoplasm of other digestive organs: Secondary | ICD-10-CM | POA: Diagnosis not present

## 2020-08-12 LAB — CMP (CANCER CENTER ONLY)
ALT: 10 U/L (ref 0–44)
AST: 15 U/L (ref 15–41)
Albumin: 4.3 g/dL (ref 3.5–5.0)
Alkaline Phosphatase: 44 U/L (ref 38–126)
Anion gap: 8 (ref 5–15)
BUN: 17 mg/dL (ref 8–23)
CO2: 27 mmol/L (ref 22–32)
Calcium: 9.7 mg/dL (ref 8.9–10.3)
Chloride: 103 mmol/L (ref 98–111)
Creatinine: 0.64 mg/dL (ref 0.44–1.00)
GFR, Estimated: >60 mL/min (ref >60)
Glucose, Bld: 93 mg/dL (ref 70–99)
Potassium: 4.3 mmol/L (ref 3.5–5.1)
Sodium: 138 mmol/L (ref 135–145)
Total Bilirubin: 0.4 mg/dL (ref 0.3–1.2)
Total Protein: 7.1 g/dL (ref 6.5–8.1)

## 2020-08-12 MED ORDER — SODIUM CHLORIDE 0.9 % IV SOLN
Freq: Once | INTRAVENOUS | Status: AC
  Filled 2020-08-12: qty 250

## 2020-08-12 MED ORDER — HEPARIN SOD (PORK) LOCK FLUSH 100 UNIT/ML IV SOLN
500.0000 [IU] | Freq: Once | INTRAVENOUS | Status: DC | PRN
  Filled 2020-08-12: qty 5

## 2020-08-12 MED ORDER — SODIUM CHLORIDE 0.9 % IV SOLN
200.0000 mg | Freq: Once | INTRAVENOUS | Status: AC
  Administered 2020-08-12: 200 mg via INTRAVENOUS
  Filled 2020-08-12: qty 8

## 2020-08-12 MED ORDER — SODIUM CHLORIDE 0.9% FLUSH
10.0000 mL | INTRAVENOUS | Status: DC | PRN
  Filled 2020-08-12: qty 10

## 2020-08-12 NOTE — Patient Instructions (Signed)
[place] Discharge Instructions for Patients Receiving Chemotherapy  Today you received the following chemotherapy agents Keytruda     If you develop nausea and vomiting that is not controlled by your nausea medication, call the clinic.   BELOW ARE SYMPTOMS THAT SHOULD BE REPORTED IMMEDIATELY:  *FEVER GREATER THAN 100.5 F  *CHILLS WITH OR WITHOUT FEVER  NAUSEA AND VOMITING THAT IS NOT CONTROLLED WITH YOUR NAUSEA MEDICATION  *UNUSUAL SHORTNESS OF BREATH  *UNUSUAL BRUISING OR BLEEDING  TENDERNESS IN MOUTH AND THROAT WITH OR WITHOUT PRESENCE OF ULCERS  *URINARY PROBLEMS  *BOWEL PROBLEMS  UNUSUAL RASH Items with * indicate a potential emergency and should be followed up as soon as possible.  Feel free to call the clinic should you have any questions or concerns at The clinic phone number is [phone].  Please show the [facility] at check-in to the Emergency Department and triage nurse.

## 2020-08-12 NOTE — Progress Notes (Signed)
1157 Patient stated that her right shoulder and right jaw were hurting. Keytruda paused. VSS. Patient in no apparent distress and appearing stable/wnl. [name] notified - he said to finish keytruda and monitor patient for 30 minutes after infusion completed. [place] restarted. Patients symptoms had resolved prior to starting [place]. VSS rechecked at 1212 and were wnl, see VS flowsheet. Patient sitting comfortably.

## 2020-08-12 NOTE — Progress Notes (Signed)
[place] OFFICE PROGRESS NOTE   Diagnosis: Non-small cell lung cancer  INTERVAL HISTORY:   [name] returns as scheduled.  She completed another cycle of Pembrolizumab 07/22/2020.  No diarrhea or rash.  She denies nausea/vomiting.  She has a good appetite.  She notes intermittent increases in dyspnea, typically morning hours.  She continues supplemental oxygen.  She recently began a course of Augmentin for ear/sinus infection.  Vertigo had "pretty much resolved" by day 3 of Augmentin.  Objective:  Vital signs in last 24 hours:  Blood pressure (!) 149/86, pulse 86, temperature 97.8 F (36.6 C), resp. rate 18, height '5\' 2"'  (1.575 m), weight 99 lb (44.9 kg), SpO2 100 %.    HEENT: No thrush or ulcers. Resp: Distant breath sounds.  No wheezes or rales.  No respiratory distress. Cardio: Regular rate and rhythm. GI: Abdomen soft and nontender.  No hepatomegaly. Vascular: No leg edema. Neuro: Alert and oriented. Skin: No rash.   Lab Results:  Lab Results  Component Value Date   WBC 4.6 07/01/2020   HGB 13.4 07/01/2020   HCT 41.8 07/01/2020   MCV 90.3 07/01/2020   PLT 206 07/01/2020   NEUTROABS 3.1 07/01/2020    Imaging:  No results found.  Medications: I have reviewed the patient's current medications.  Assessment/Plan: 1. 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  PDL1 60%  Left lung radiation 11/08/2016 through 11/21/2016  CT chest 12/12/2016-reexpansion of left upper lobe with a decreased left upper lobe mass, unchanged mediastinal adenopathy,  progression of a splenic metastasis/pancreatic tail/gastrohepatic ligament metastasis, right lower lobe pneumonia  Cycle 1 Pembrolizumab 12/14/2016  Cycle 2 Pembrolizumab 01/03/2017  Cycle 3 Pembrolizumab 01/24/2017  Cycle 4 Pembrolizumab 02/12/2017  CT 03/05/2018-decrease in left upper lobe mass, mediastinal adenopathy, and splenic mass  Cycle 5 pembrolizumab 03/07/2017  Cycle 6 Pembrolizumab 04/02/2017  Cycle 7 pembrolizumab 04/25/2017  Cycle 8 Pembrolizumab 05/14/2017  Cycle 9 Pembrolizumab 06/06/2017  CT 06/20/2017-mild decrease in left upper lobe mass, mild increase in paramediastinal left upper lobe nodule-radiation change?, decreased splenic metastasis  Cycle 10 pembrolizumab 06/25/2017  Cycle 11 pembrolizumab 07/18/2017  Cycle 12 pembrolizumab 08/29/2017  Cycle 13 pembrolizumab 09/17/2017  Cycle 14 Pembrolizumab 10/10/2017  CT chest 10/24/2017-slight enlargement of small mediastinal lymph nodes, increased left upper lobe consolidation, decreased size of spleen lesion  Cycle 15 pembrolizumab 10/29/2017  Cycle 16 Pembrolizumab 11/21/2017  Cycle 17 Pembrolizumab 12/11/2017  Cycle 18 pembrolizumab 01/02/2018  Cycle 19 Pembrolizumab 01/21/2018  Cycle 20 Pembrolizumab 02/13/2018  CT chest 02/27/2018-decreased left paratracheal node, progressive consolidation/fibrosis in the left upper lung, decreased size of splenic mass  Cycle 21 pembrolizumab 03/04/2018  Cycle 22 Pembrolizumab 03/27/2018  Cycle 23 pembrolizumab 04/15/2018  Cycle 24 pembrolizumab 05/08/2018  Cycle 25 Pembrolizumab 05/27/2018  CT chest 06/16/2018-stable left upper lung radiation fibrosis with no evidence of local tumor recurrence. Decreased left paratracheal adenopathy. No new or progressive metastatic disease in the chest. Gastrohepatic ligament lymphadenopathy stable. Splenic metastasis decreased. T5 vertebral compression fracture with associated patchy sclerosis and no discrete osseous lesion, new  in the interval.  Cycle 26 Pembrolizumab 06/19/2018  Cycle 27 pembrolizumab 07/08/2018  Cycle 28 pembrolizumab 07/31/2018  Cycle  29 pembrolizumab 08/19/2018  Cycle 30 Pembrolizumab 09/11/2018  Cycle 31 pembrolizumab 09/30/2018  Cycle 32 pembrolizumab 10/23/2018  CT chest 10/28/2018-left apical pleural-parenchymal opacity consistent with radiation scarring has become more confluent likely representing evolutionary change. Continued further decrease in size of index left paratracheal node and splenic metastasis. Gastrohepatic ligament lymph node not changed.  Cycle 33 Pembrolizumab 11/11/2018  Cycle 34 pembrolizumab 12/04/2018  Cycle 35 pembrolizumab 12/23/2018  CT chest 02/12/2019-posttreatment changes left upper lobe unchanged. Continued decrease in size of splenic metastasis. Stable appearance of left paratracheal lymph nodes. Stable gastrohepatic adenopathy. Incomplete visualization of retroperitoneal lymph nodes in the upper abdomen.  CT chest 07/02/2019-stable post treatment related changes left lung. 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 Slight regression of enlarged likely metastatic gastrohepatic lymph node.  CT chest 11/11/2019-interval enlargement of a right paratracheal lymph node measuring 14 mm, increased from 5 mm. No new lung mass or nodularity. New adenopathy in the upper abdomen. 2.7 cm lesion gastrohepatic ligament. Necrotic node at the splenic hilum measures 4.7 cm. Large node along the IVC left upper quadrant measures 2.7 cm. Similar node left adjacent the aorta measures 1.8 cm. Enhancing lesion in the spleen is unchanged. Several low-density lesions in the liver are unchanged.  Pembrolizumab resumed on a 3-week schedule 12/03/2019  CT chest 02/22/2020-interval resolution of previously demonstrated left paratracheal and upper abdominal adenopathy. Stable treated metastasis within the spleen. Stable radiation changes in the left lung with left hilar distortion.  No evidence of local recurrence or progressive metastatic disease.  Continuation of pembrolizumab every 3 weeks 02/26/2020  CT chest 06/28/2020-no change in left upper lobe consolidation, unchanged spleen metastasis, no evidence of progressive disease  Pembrolizumab continued every 3 weeks 07/01/2020   2. 05/29/2019 laryngoscopy-exophytic appearing mass mid right vocal cord.   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 Tumor cells negative for p16, CMV and HSV 2. 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   Radiation 07/29/2019-08/25/2019  3. History of acute respiratory failure secondary to #1  4. Oxygen dependent COPD  5. History of a NSTEMI 2015  6. Right lung pneumonia on chest CT 12/12/2016-treated with Levaquin  7.Grade 2 rash possibly related to Pembrolizumab. Treatment held 08/06/2017.Improved 08/29/2017, treatment resumed, rash has resolved  8.  Vertigo January 2022-improved with course of Augmentin for ear/sinus infection   Disposition: Brooke [name] appears stable.  There is no clinical evidence of disease progression.  Plan to continue Pembrolizumab every 3 weeks.  Chemistry panel from today reviewed.  Labs adequate to proceed with Pembrolizumab.  She will return for lab, follow-up, Pembrolizumab in 3 weeks.    [name] ANP/GNP-BC   08/12/2020  9:55 AM

## 2020-08-12 NOTE — Progress Notes (Signed)
1310  Patient discharged via wheelchair. Nurse wheeled patient to front entrance where patient waited in wheelchair for [facility] transportation to pick patient up. Front desk assistance said they would assist patient to transportation [place]. Patient left in stable condition.

## 2020-08-22 ENCOUNTER — Ambulatory Visit (INDEPENDENT_AMBULATORY_CARE_PROVIDER_SITE_OTHER): Payer: Medicare Other

## 2020-08-22 ENCOUNTER — Encounter: Payer: Self-pay | Admitting: Primary Care

## 2020-08-22 ENCOUNTER — Telehealth: Payer: Self-pay | Admitting: Internal Medicine

## 2020-08-22 ENCOUNTER — Ambulatory Visit (INDEPENDENT_AMBULATORY_CARE_PROVIDER_SITE_OTHER): Payer: Medicare Other | Admitting: Primary Care

## 2020-08-22 ENCOUNTER — Other Ambulatory Visit: Payer: Self-pay

## 2020-08-22 VITALS — BP 122/86 | HR 83 | Temp 97.0°F | Ht 62.5 in | Wt 98.0 lb

## 2020-08-22 DIAGNOSIS — J441 Chronic obstructive pulmonary disease with (acute) exacerbation: Secondary | ICD-10-CM

## 2020-08-22 DIAGNOSIS — J9611 Chronic respiratory failure with hypoxia: Secondary | ICD-10-CM

## 2020-08-22 DIAGNOSIS — J449 Chronic obstructive pulmonary disease, unspecified: Secondary | ICD-10-CM

## 2020-08-22 MED ORDER — METHYLPREDNISOLONE ACETATE 80 MG/ML IJ SUSP: 80.0000 mg | Freq: Once | INTRAMUSCULAR | Status: DC

## 2020-08-22 MED ORDER — METHYLPREDNISOLONE ACETATE 80 MG/ML IJ SUSP
80.0000 mg | Freq: Once | INTRAMUSCULAR | Status: AC
Start: 2020-08-22 — End: 2020-09-12
  Administered 2020-09-12: 80 mg via INTRAMUSCULAR

## 2020-08-22 MED ORDER — METHYLPREDNISOLONE ACETATE 80 MG/ML IJ SUSP
80.0000 mg | Freq: Once | INTRAMUSCULAR | Status: DC
  Administered 2020-08-22: 80 mg via INTRAMUSCULAR

## 2020-08-22 NOTE — Telephone Encounter (Signed)
MW please advise if you want the pt to have the PFT or if we can use a different dx for the pulmonary rehab to get the pt in.  thanks

## 2020-08-22 NOTE — Progress Notes (Addendum)
@Patient  ID: Brooke [name], female    DOB: [birth year], 75 y.o.   MRN: [id number]  Chief Complaint  Patient presents with  . Shortness of Breath    SOB- started last Friday.    Referring provider: [name], MD  HPI: 75 year old female, former smoker quit in 2009 (30-pack-year history).  Past medical history significant for chronic respiratory failure with hypoxia O2 dependent, COPD Gold 4, malignant neoplasm of glottis, malignant neoplasm of left upper lung, NSTEMI, hypertension, GERD, hyperlipidemia.  Patient of Dr. Melvyn Novas, last seen on 06/06/2020.  Maintained on Breztri Aerosphere two puffs twice daily and as needed DuoNebs.   Spirometry 02/15/2016  FEV1 0.60 (28%)  Ratio 38 with classic curvature  p am symbicort 160   08/22/2020 Patient presents today for acute office visit reports of persistent shortness of breath.  Patient has stage IV COPD.  During last visit in January she was started on Sun[employer].  She was on 14 day course of Augmentin for sinusitis, finished 1 week ago. She initially felt some better but developed shortness of breath and wheezing over the last week. She feels better today than she has in a week. Cough has cleared some from green-yellow to now white. She saw her PCP last Friday but they do not have radiology department for CXR. She can not take oral prednisone. She has previously received depo-medrol injection with improvement. She is back to using Symbicort. She did not try beztri d/t vertigo symptoms and not wanting to start a new medication. She runs out of air when speaking. Reports dry mouth. She drinks a lot of water. No fever, chills, chest tightness or cough.    Allergies  Allergen Reactions  . Codeine Nausea And Vomiting  . Imdur [Isosorbide Dinitrate]     headache  . Prednisone Other (See Comments)    Reaction:Abnormal behavior; cannot take in pill form but CAN tolerate the injection  . Pulmicort [Budesonide]     "feeling of torture"     Immunization History  Administered Date(s) Administered  . Influenza, High Dose Seasonal PF 02/08/2016  . Influenza,inj,Quad PF,6+ Mos 02/08/2016  . PFIZER(Purple Top)SARS-COV-2 Vaccination 07/07/2019, 08/05/2019, 01/15/2020  . Pneumococcal Conjugate-13 02/05/2015  . Pneumococcal Polysaccharide-23 02/04/2013    Past Medical History:  Diagnosis Date  . Chronic respiratory failure (HCC)    a. on home O2.  Marland Kitchen COPD (chronic obstructive pulmonary disease) ([place])    a. Home O2.  . Former tobacco use   . GERD (gastroesophageal reflux disease)   . Hyperlipidemia   . 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 Liver metastases ([place])   . lung ca 10/2016  . 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 NSTEMI (non-ST elevated myocardial infarction) ([place])    a. 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 Coronary vasospasm    Tobacco History: Social History   Tobacco Use  Smoking Status Former Smoker  . Packs/day: 1.00  . Years: 30.00  . Pack years: 30.00  . Types: Cigarettes  . Quit date: 05/08/2007  . Years since quitting: 13.3  Smokeless Tobacco Never Used   Counseling given: Not Answered   Outpatient Medications Prior to Visit  Medication Sig Dispense Refill  . acetaminophen (TYLENOL) 325 MG tablet Per bottle as needed    . albuterol (PROVENTIL HFA;VENTOLIN HFA) 108 (90 BASE) MCG/ACT inhaler Inhale 2 puffs into the lungs every 4 (four) hours as needed for wheezing or shortness of breath. **PLAN B**    .  ALPRAZolam (XANAX) 0.25 MG tablet 1/2-1 twice daily as needed  1  . amLODipine (NORVASC) 2.5 MG tablet Take by mouth based on top number of BP. 1 tab (2.5mg ) if BP 140-159, 2 tabs (5mg ) for 160-179, 3 tabs (7.5 mg) for BP >180 180 tablet 3  . atorvastatin (LIPITOR) 20 MG tablet TAKE 1 TABLET(20 MG) BY MOUTH DAILY AT 6 PM 90 tablet 1  . Budeson-Glycopyrrol-Formoterol (BREZTRI AEROSPHERE) 160-9-4.8 MCG/ACT AERO Inhale 2 puffs into the lungs in  the morning and at bedtime. 5.9 g 0  . budesonide-formoterol (SYMBICORT) 160-4.5 MCG/ACT inhaler Inhale 2 puffs into the lungs 2 (two) times daily.    Marland Kitchen docusate sodium (COLACE) 100 MG capsule Take 100-200 mg by mouth daily.    Marland Kitchen guaiFENesin (MUCINEX) 600 MG 12 hr tablet Take 600 mg by mouth 2 (two) times daily as needed for cough or to loosen phlegm.    Marland Kitchen ibuprofen (ADVIL,MOTRIN) 600 MG tablet Take 600 mg by mouth every 6 (six) hours as needed.    Marland Kitchen ipratropium-albuterol (DUONEB) 0.5-2.5 (3) MG/3ML SOLN Take 3 mLs by nebulization every 4 (four) hours as needed (wheezing/shortness of breath). **PLAN C**    . meclizine (ANTIVERT) 25 MG tablet Take 25 mg by mouth 2 (two) times daily as needed.    . neomycin-polymyxin-hydrocortisone (CORTISPORIN) 3.5-10000-1 OTIC suspension SMARTSIG:Left Ear    . nitroGLYCERIN (NITROSTAT) 0.4 MG SL tablet ONE TABLET UNDER TONGUE AS NEEDED FOR CHEST PAIN EVERY 5 MINUTES FOR 3 DOSES 25 tablet 3  . NON FORMULARY [facility]  Onychomycosis Nail Lacquer -  Fluconazole 2%, Terbinafine 1% DMSO/undecylenic acid 25% Apply to affected nail once daily Qty. 120 gm 3 refills    . OXYGEN 2lpm 24/7    . Probiotic Product (PROBIOTIC ADVANCED PO) Take 1 tablet by mouth daily.    . prochlorperazine (COMPAZINE) 5 MG tablet Take 5-10 mg by mouth every 6 (six) hours as needed.    . Simethicone 180 MG CAPS Use as needed    . amoxicillin-clavulanate (AUGMENTIN) 875-125 MG tablet Take 1 tablet by mouth 2 (two) times daily. (Patient not taking: Reported on 08/22/2020)     No facility-administered medications prior to visit.   Review of Systems  Review of Systems  Constitutional: Negative.   HENT: Positive for voice change.   Respiratory: Positive for cough, shortness of breath and wheezing. Negative for chest tightness.      Physical Exam  BP 122/86 (BP Location: Left Arm, Patient Position: Sitting, Cuff Size: Normal)   Pulse 83   Temp (!) 97 F (36.1 C) (Temporal)    Ht 5' 2.5" (1.588 m)   Wt 98 lb (44.5 kg)   SpO2 97%   BMI 17.64 kg/m  Physical Exam Constitutional:      Appearance: She is well-developed.  HENT:     Head: Normocephalic and atraumatic.  Cardiovascular:     Rate and Rhythm: Normal rate and regular rhythm.  Pulmonary:     Effort: No tachypnea or respiratory distress.     Breath sounds: Examination of the left-upper field reveals wheezing. Wheezing present.  Musculoskeletal:     Comments: Gait not assessed, in WC  Skin:    General: Skin is warm and dry.  Neurological:     General: No focal deficit present.     Mental Status: She is alert and oriented to person, place, and time.  Psychiatric:        Mood and Affect: Mood normal.  Behavior: Behavior normal.      Lab Results:  CBC    Component Value Date/Time   WBC 4.6 07/01/2020 0902   WBC 5.2 04/02/2017 1026   WBC 9.1 11/01/2016 0348   RBC 4.63 07/01/2020 0902   HGB 13.4 07/01/2020 0902   HGB 12.5 04/02/2017 1026   HCT 41.8 07/01/2020 0902   HCT 39.0 04/02/2017 1026   PLT 206 07/01/2020 0902   PLT 225 04/02/2017 1026   MCV 90.3 07/01/2020 0902   MCV 80.7 04/02/2017 1026   MCH 28.9 07/01/2020 0902   MCHC 32.1 07/01/2020 0902   RDW 13.1 07/01/2020 0902   RDW 17.3 (H) 04/02/2017 1026   LYMPHSABS 1.0 07/01/2020 0902   LYMPHSABS 0.8 (L) 04/02/2017 1026   MONOABS 0.4 07/01/2020 0902   MONOABS 0.4 04/02/2017 1026   EOSABS 0.1 07/01/2020 0902   EOSABS 0.1 04/02/2017 1026   BASOSABS 0.0 07/01/2020 0902   BASOSABS 0.0 04/02/2017 1026    BMET    Component Value Date/Time   NA 138 08/12/2020 0938   NA 139 04/02/2017 1026   K 4.3 08/12/2020 0938   K 4.1 04/02/2017 1026   CL 103 08/12/2020 0938   CO2 27 08/12/2020 0938   CO2 25 04/02/2017 1026   GLUCOSE 93 08/12/2020 0938   GLUCOSE 88 04/02/2017 1026   BUN 17 08/12/2020 0938   BUN 19.6 04/02/2017 1026   CREATININE 0.64 08/12/2020 0938   CREATININE 0.6 04/02/2017 1026   CALCIUM 9.7 08/12/2020 0938    CALCIUM 9.3 04/02/2017 1026   GFRNONAA >60 08/12/2020 0938   GFRAA >60 02/05/2020 0901    BNP    Component Value Date/Time   BNP 40.3 10/22/2016 1739    ProBNP    Component Value Date/Time   PROBNP 994.7 (H) 06/06/2013 0105    Imaging: DG Chest 2 View  Result Date: 08/22/2020 CLINICAL DATA:  75 year old female with history of COPD exacerbation presenting with cough for the past 2 weeks. EXAM: CHEST - 2 VIEW COMPARISON:  Chest x-ray 03/28/2020. FINDINGS: Chronic volume loss and postradiation fibrosis in the left upper lobe along with chronic pleuroparenchymal thickening in the apex of the left hemithorax, stable compared to the prior examination. Compensatory hyperexpansion of the left lower lobe. Lungs are otherwise clear bilaterally. No pleural effusions. No right pleural effusion. No pneumothorax. No evidence of pulmonary edema. No definite suspicious appearing pulmonary nodules or masses are noted. Heart size is normal. IMPRESSION: 1. No radiographic evidence of acute cardiopulmonary disease. 2. Radiographic appearance the chest is unchanged, as detailed above. Electronically Signed   By: Vinnie Langton M.D.   On: 08/22/2020 17:00     Assessment & Plan:   COPD  GOLD IV  - Spirometry 02/15/2016 FEV1 0.60 (28%) Ratio 38. AECOPD x1 week with progressive dyspnea symptoms over the last several months. Currently using Symbicort 160 two puffs twice daily. Encourage she start trial SunGard. Received IV depo-medrol 80mg  IM x1 today. Holding off on further abx at this time, no indication. Checking CXR and sputum culture. Refer to pulmonary rehab.   Chronic respiratory failure with hypoxia (HCC) - Continues to benefit from supplemental oxygen; using 2L on exertion and at night    Martyn Ehrich, NP 08/22/2020

## 2020-08-22 NOTE — Addendum Note (Signed)
Addended byCoralie Keens on: 08/22/2020 11:34 AM   Modules accepted: Orders

## 2020-08-22 NOTE — Assessment & Plan Note (Signed)
-   Continues to benefit from supplemental oxygen; using 2L on exertion and at night

## 2020-08-22 NOTE — Telephone Encounter (Signed)
I have sent in the new order for pulm rehab.

## 2020-08-22 NOTE — Addendum Note (Signed)
Addended by: Carlisle Cater on: 08/22/2020 11:54 AM   Modules accepted: Orders

## 2020-08-22 NOTE — Patient Instructions (Addendum)
  Recommendations: - Start Breztri - take two puffs morning and evening (rinse mouth after use) - Stop Symbicort - Take Mucinex 600mg  twice a day with full glass of water  As needed for congestion   Orders: - Depo medrol 80mg  IM x 1  - Sputum sample (ordered) - CXR today ( ordered)  Referral: - Pulmonary rehab re: stage IV COPD (ordered)  Follow-up: - May 9th with Dr. Melvyn Novas

## 2020-08-22 NOTE — Assessment & Plan Note (Addendum)
-   Spirometry 02/15/2016 FEV1 0.60 (28%) Ratio 38. AECOPD x1 week with progressive dyspnea symptoms over the last several months. Currently using Symbicort 160 two puffs twice daily. Encourage she start trial SunGard. Received IV depo-medrol 80mg  IM x1 today. Holding off on further abx at this time, no indication. Checking CXR and sputum culture. Refer to pulmonary rehab.

## 2020-08-22 NOTE — Progress Notes (Signed)
Please let patient know chronic postradiation fibrosis left upper lung, unchanged. Chronic hyperexpansion. No evidence of acute cardiopulmonary. No indication for antibiotics. No infection.

## 2020-08-22 NOTE — Telephone Encounter (Signed)
You can try chronic resp failure hypoxemia

## 2020-08-24 ENCOUNTER — Encounter (HOSPITAL_COMMUNITY): Payer: Self-pay | Admitting: *Deleted

## 2020-08-24 NOTE — Progress Notes (Signed)
Received referral from Dr. Melvyn Novas for this pt to participate in pulmonary rehab with the the diagnosis of chronic respiratory failure with hypoxia. Clinical review of pt follow up appt on 4/18 with Geraldo Pitter NP with  Pulmonary office note.  Pt with Covid Risk Score - 6. Pt appropriate for scheduling for Pulmonary rehab.  Will forward to support staff for scheduling and verification of insurance eligibility/benefits with pt consent. Cherre Huger, BSN Cardiac and Training and development officer

## 2020-08-28 ENCOUNTER — Other Ambulatory Visit: Payer: Self-pay | Admitting: Oncology

## 2020-09-02 ENCOUNTER — Inpatient Hospital Stay (HOSPITAL_BASED_OUTPATIENT_CLINIC_OR_DEPARTMENT_OTHER): Payer: Medicare Other | Admitting: Oncology

## 2020-09-02 ENCOUNTER — Other Ambulatory Visit: Payer: Self-pay

## 2020-09-02 ENCOUNTER — Inpatient Hospital Stay: Payer: Medicare Other

## 2020-09-02 VITALS — BP 156/81 | HR 77 | Temp 98.2°F | Resp 20 | Ht 62.0 in | Wt 96.2 lb

## 2020-09-02 VITALS — BP 172/98 | HR 73 | Temp 98.0°F | Resp 18

## 2020-09-02 DIAGNOSIS — C3412 Malignant neoplasm of upper lobe, left bronchus or lung: Secondary | ICD-10-CM

## 2020-09-02 DIAGNOSIS — Z5112 Encounter for antineoplastic immunotherapy: Secondary | ICD-10-CM

## 2020-09-02 LAB — CBC WITH DIFFERENTIAL (CANCER CENTER ONLY)
Abs Immature Granulocytes: 0.01 10*3/uL (ref 0.00–0.07)
Basophils Absolute: 0 10*3/uL (ref 0.0–0.1)
Basophils Relative: 1 %
Eosinophils Absolute: 0.1 10*3/uL (ref 0.0–0.5)
Eosinophils Relative: 3 %
HCT: 42.5 % (ref 36.0–46.0)
Hemoglobin: 13.7 g/dL (ref 12.0–15.0)
Immature Granulocytes: 0 %
Lymphocytes Relative: 24 %
Lymphs Abs: 1.1 10*3/uL (ref 0.7–4.0)
MCH: 29 pg (ref 26.0–34.0)
MCHC: 32.2 g/dL (ref 30.0–36.0)
MCV: 89.9 fL (ref 80.0–100.0)
Monocytes Absolute: 0.3 10*3/uL (ref 0.1–1.0)
Monocytes Relative: 7 %
Neutro Abs: 2.9 10*3/uL (ref 1.7–7.7)
Neutrophils Relative %: 65 %
Platelet Count: 207 10*3/uL (ref 150–400)
RBC: 4.73 MIL/uL (ref 3.87–5.11)
RDW: 12.9 % (ref 11.5–15.5)
WBC Count: 4.4 10*3/uL (ref 4.0–10.5)
nRBC: 0 % (ref 0.0–0.2)

## 2020-09-02 LAB — CMP (CANCER CENTER ONLY)
ALT: 12 U/L (ref 0–44)
AST: 15 U/L (ref 15–41)
Albumin: 4.4 g/dL (ref 3.5–5.0)
Alkaline Phosphatase: 52 U/L (ref 38–126)
Anion gap: 6 (ref 5–15)
BUN: 14 mg/dL (ref 8–23)
CO2: 29 mmol/L (ref 22–32)
Calcium: 9.6 mg/dL (ref 8.9–10.3)
Chloride: 99 mmol/L (ref 98–111)
Creatinine: 0.51 mg/dL (ref 0.44–1.00)
GFR, Estimated: >60 mL/min (ref >60)
Glucose, Bld: 95 mg/dL (ref 70–99)
Potassium: 4 mmol/L (ref 3.5–5.1)
Sodium: 134 mmol/L — ABNORMAL LOW (ref 135–145)
Total Bilirubin: 0.5 mg/dL (ref 0.3–1.2)
Total Protein: 7 g/dL (ref 6.5–8.1)

## 2020-09-02 LAB — TSH: TSH: 1.454 u[IU]/mL (ref 0.350–4.500)

## 2020-09-02 MED ORDER — SODIUM CHLORIDE 0.9% FLUSH
10.0000 mL | INTRAVENOUS | Status: DC | PRN
  Filled 2020-09-02: qty 10

## 2020-09-02 MED ORDER — SODIUM CHLORIDE 0.9 % IV SOLN
Freq: Once | INTRAVENOUS | Status: AC
  Filled 2020-09-02: qty 250

## 2020-09-02 MED ORDER — SODIUM CHLORIDE 0.9 % IV SOLN
200.0000 mg | Freq: Once | INTRAVENOUS | Status: AC
  Administered 2020-09-02: 200 mg via INTRAVENOUS
  Filled 2020-09-02: qty 8

## 2020-09-02 NOTE — Progress Notes (Signed)
[facility] OFFICE PROGRESS NOTE   Diagnosis: Non-small cell lung cancer  INTERVAL HISTORY:   Brooke [name] returns as scheduled.  She completed another treatment of pembrolizumab on 08/12/2020.  She developed right shoulder and right jaw discomfort toward the end of the pembrolizumab infusion.  This lasted for approximately 1 minute.  The infusion was completed when the pain resolved and she had no recurrent chest pain.  No rash.  She reports dyspnea is improved.  Vertigo has resolved.  Objective:  Vital signs in last 24 hours:  Blood pressure (!) 156/81, pulse 77, temperature 98.2 F (36.8 C), temperature source Oral, resp. rate 20, height '5\' 2"'  (1.575 m), weight 96 lb 3.2 oz (43.6 kg), SpO2 99 %.    Lymphatics: No cervical or supraclavicular nodes Resp: Lungs clear bilaterally Cardio: Regular rate and rhythm GI: No hepatosplenomegaly Vascular: No leg edema  Skin: No rash    Lab Results:  Lab Results  Component Value Date   WBC 4.4 09/02/2020   HGB 13.7 09/02/2020   HCT 42.5 09/02/2020   MCV 89.9 09/02/2020   PLT 207 09/02/2020   NEUTROABS 2.9 09/02/2020    CMP  Lab Results  Component Value Date   NA 134 (L) 09/02/2020   K 4.0 09/02/2020   CL 99 09/02/2020   CO2 29 09/02/2020   GLUCOSE 95 09/02/2020   BUN 14 09/02/2020   CREATININE 0.51 09/02/2020   CALCIUM 9.6 09/02/2020   PROT 7.0 09/02/2020   ALBUMIN 4.4 09/02/2020   AST 15 09/02/2020   ALT 12 09/02/2020   ALKPHOS 52 09/02/2020   BILITOT 0.5 09/02/2020   GFRNONAA >60 09/02/2020   GFRAA >60 02/05/2020     Medications: I have reviewed the patient's current medications.   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  PDL1 60%  Left lung radiation 11/08/2016 through 11/21/2016  CT chest 12/12/2016-reexpansion of left upper lobe with a decreased left upper lobe mass, unchanged mediastinal adenopathy, progression of a splenic metastasis/pancreatic tail/gastrohepatic ligament metastasis, right lower lobe pneumonia  Cycle 1 Pembrolizumab 12/14/2016  Cycle 2 Pembrolizumab 01/03/2017  Cycle 3 Pembrolizumab 01/24/2017  Cycle 4 Pembrolizumab 02/12/2017  CT 03/05/2018-decrease in left upper lobe mass, mediastinal adenopathy, and splenic mass  Cycle 5 pembrolizumab 03/07/2017  Cycle 6 Pembrolizumab 04/02/2017  Cycle 7 pembrolizumab 04/25/2017  Cycle 8 Pembrolizumab 05/14/2017  Cycle 9 Pembrolizumab 06/06/2017  CT 06/20/2017-mild decrease in left upper lobe mass, mild increase in paramediastinal left upper lobe nodule-radiation change?, decreased splenic metastasis  Cycle 10 pembrolizumab 06/25/2017  Cycle 11 pembrolizumab 07/18/2017  Cycle 12 pembrolizumab 08/29/2017  Cycle 13 pembrolizumab 09/17/2017  Cycle 14 Pembrolizumab 10/10/2017  CT chest 10/24/2017-slight enlargement of small mediastinal lymph nodes, increased left upper lobe consolidation, decreased size of spleen lesion  Cycle 15 pembrolizumab 10/29/2017  Cycle 16 Pembrolizumab 11/21/2017  Cycle 17 Pembrolizumab 12/11/2017  Cycle 18 pembrolizumab 01/02/2018  Cycle 19 Pembrolizumab 01/21/2018  Cycle 20 Pembrolizumab 02/13/2018  CT chest 02/27/2018-decreased left paratracheal node, progressive consolidation/fibrosis in the left upper lung, decreased size of splenic mass  Cycle 21 pembrolizumab 03/04/2018  Cycle 22 Pembrolizumab 03/27/2018  Cycle 23 pembrolizumab 04/15/2018  Cycle 24 pembrolizumab 05/08/2018  Cycle 25 Pembrolizumab 05/27/2018  CT chest 06/16/2018-stable left upper lung  radiation fibrosis with no evidence of local tumor recurrence. Decreased left paratracheal adenopathy. No new or progressive metastatic disease in the chest. Gastrohepatic ligament lymphadenopathy stable. Splenic metastasis decreased. T5 vertebral compression fracture with associated patchy sclerosis and no discrete osseous lesion, new in the interval.  Cycle 26 Pembrolizumab 06/19/2018  Cycle 27 pembrolizumab 07/08/2018  Cycle 28 pembrolizumab 07/31/2018  Cycle 29 pembrolizumab 08/19/2018  Cycle 30 Pembrolizumab 09/11/2018  Cycle 31 pembrolizumab 09/30/2018  Cycle 32 pembrolizumab 10/23/2018  CT chest 10/28/2018-left apical pleural-parenchymal opacity consistent with radiation scarring has become more confluent likely representing evolutionary change. Continued further decrease in size of index left paratracheal node and splenic metastasis. Gastrohepatic ligament lymph node not changed.  Cycle 33 Pembrolizumab 11/11/2018  Cycle 34 pembrolizumab 12/04/2018  Cycle 35 pembrolizumab 12/23/2018  CT chest 02/12/2019-posttreatment changes left upper lobe unchanged. Continued decrease in size of splenic metastasis. Stable appearance of left paratracheal lymph nodes. Stable gastrohepatic adenopathy. Incomplete visualization of retroperitoneal lymph nodes in the upper abdomen.  CT chest 07/02/2019-stable post treatment related changes left lung. 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 Slight regression of enlarged likely metastatic gastrohepatic lymph node.  CT chest 11/11/2019-interval enlargement of a right paratracheal lymph node measuring 14 mm, increased from 5 mm. No new lung mass or nodularity. New adenopathy in the upper abdomen. 2.7 cm lesion gastrohepatic ligament. Necrotic node at the splenic hilum measures 4.7 cm. Large node along the IVC left upper quadrant measures 2.7 cm. Similar node left adjacent the aorta measures 1.8 cm. Enhancing lesion in the spleen is unchanged. Several  low-density lesions in the liver are unchanged.  Pembrolizumab resumed on a 3-week schedule 12/03/2019  CT chest 02/22/2020-interval resolution of previously demonstrated left paratracheal and upper abdominal adenopathy. Stable treated metastasis within the spleen. Stable radiation changes in the left lung with left hilar distortion. No evidence of local recurrence or progressive metastatic disease.  Continuation of pembrolizumab every 3 weeks 02/26/2020  CT chest 06/28/2020-no change in left upper lobe consolidation, unchanged spleen metastasis, no evidence of progressive disease  Pembrolizumab continued every 3 weeks 07/01/2020   2. 05/29/2019 laryngoscopy-exophytic appearing mass mid right vocal cord.   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 Tumor cells negative for p16, CMV and HSV 2. 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   Radiation 07/29/2019-08/25/2019  3. History of acute respiratory failure secondary to #1  4. Oxygen dependent COPD  5. History of a NSTEMI 2015  6. Right lung pneumonia on chest CT 12/12/2016-treated with Levaquin  7.Grade 2 rash possibly related to Pembrolizumab. Treatment held 08/06/2017.Improved 08/29/2017, treatment resumed, rash has resolved  8.  Vertigo January 2022-improved with course of Augmentin for ear/sinus infection   Disposition: Ms. Gilham appears stable.  She will continue every 3-week pembrolizumab.  We will plan for a restaging CT in late June or early July.  I doubt the jaw and shoulder discomfort she experienced with the last infusion were related to the pembrolizumab.  She continues follow-up with Dr. Melvyn Novas for management of COPD.  I encouraged her to try to gain weight now that the vertigo has resolved.  Betsy Coder, MD  09/02/2020  10:07 AM

## 2020-09-02 NOTE — Patient Instructions (Signed)
[facility]  Discharge Instructions: Thank you for choosing [facility] to provide your oncology and hematology care.   If you have a lab appointment with the [facility], please go directly to the [facility] and check in at the registration area.   Wear comfortable clothing and clothing appropriate for easy access to any Portacath or PICC line.   We strive to give you quality time with your provider. You may need to reschedule your appointment if you arrive late (15 or more minutes).  Arriving late affects you and other patients whose appointments are after yours.  Also, if you miss three or more appointments without notifying the office, you may be dismissed from the clinic at the provider's discretion.      For prescription refill requests, have your pharmacy contact our office and allow 72 hours for refills to be completed.    Today you received the following chemotherapy and/or immunotherapy agents Brooke [name]   To help prevent nausea and vomiting after your treatment, we encourage you to take your nausea medication as directed.  BELOW ARE SYMPTOMS THAT SHOULD BE REPORTED IMMEDIATELY: . *FEVER GREATER THAN 100.4 F (38 C) OR HIGHER . *CHILLS OR SWEATING . *NAUSEA AND VOMITING THAT IS NOT CONTROLLED WITH YOUR NAUSEA MEDICATION . *UNUSUAL SHORTNESS OF BREATH . *UNUSUAL BRUISING OR BLEEDING . *URINARY PROBLEMS (pain or burning when urinating, or frequent urination) . *BOWEL PROBLEMS (unusual diarrhea, constipation, pain near the anus) . TENDERNESS IN MOUTH AND THROAT WITH OR WITHOUT PRESENCE OF ULCERS (sore throat, sores in mouth, or a toothache) . UNUSUAL RASH, SWELLING OR PAIN  . UNUSUAL VAGINAL DISCHARGE OR ITCHING   Items with * indicate a potential emergency and should be followed up as soon as possible or go to the Emergency Department if any problems should occur.  Please show the CHEMOTHERAPY ALERT CARD or IMMUNOTHERAPY ALERT CARD at  check-in to the Emergency Department and triage nurse.  Should you have questions after your visit or need to cancel or reschedule your appointment, please contact [facility]  Dept: [phone]  and follow the prompts.  Office hours are 8:00 a.m. to 4:30 p.m. Monday - Friday. Please note that voicemails left after 4:00 p.m. may not be returned until the following business day.  We are closed weekends and major holidays. You have access to a nurse at all times for urgent questions. Please call the main number to the clinic Dept: [phone] and follow the prompts.   For any non-urgent questions, you may also contact your provider using MyChart. We now offer e-Visits for anyone 71 and older to request care online for non-urgent symptoms. For details visit [email or web address].   Also download the MyChart app! Go to the app store, search "MyChart", open the app, select [facility], and log in with your MyChart username and password.  Due to Covid, a mask is required upon entering the hospital/clinic. If you do not have a mask, one will be given to you upon arrival. For doctor visits, patients may have 1 support person aged 57 or older with them. For treatment visits, patients cannot have anyone with them due to current Covid guidelines and our immunocompromised population.

## 2020-09-02 NOTE — Progress Notes (Signed)
Pt here for [place].  Labs WNL for treatment today.  Tolerated treatment well today without incidence.  Stable during and after treatment.  AVS reviewed.  Blood pressure elevated but has not taken her blood pressure medication today.  She will re-take her blood pressure when she arrives home and take her blood pressure accordingly. Discharged in stable condition via wheelchair.

## 2020-09-05 ENCOUNTER — Other Ambulatory Visit: Payer: Self-pay

## 2020-09-05 ENCOUNTER — Encounter: Payer: Self-pay | Admitting: Cardiovascular Disease

## 2020-09-05 ENCOUNTER — Ambulatory Visit (INDEPENDENT_AMBULATORY_CARE_PROVIDER_SITE_OTHER): Payer: Medicare Other | Admitting: Cardiovascular Disease

## 2020-09-05 VITALS — BP 108/62 | HR 72 | Ht 62.0 in | Wt 96.4 lb

## 2020-09-05 DIAGNOSIS — E7800 Pure hypercholesterolemia, unspecified: Secondary | ICD-10-CM

## 2020-09-05 DIAGNOSIS — I214 Non-ST elevation (NSTEMI) myocardial infarction: Secondary | ICD-10-CM | POA: Diagnosis not present

## 2020-09-05 DIAGNOSIS — R0989 Other specified symptoms and signs involving the circulatory and respiratory systems: Secondary | ICD-10-CM

## 2020-09-05 LAB — LIPID PANEL
Chol/HDL Ratio: 2.2 ratio (ref 0.0–4.4)
Cholesterol, Total: 220 mg/dL — ABNORMAL HIGH (ref 100–199)
HDL: 102 mg/dL (ref >39)
LDL Chol Calc (NIH): 110 mg/dL — ABNORMAL HIGH (ref 0–99)
Triglycerides: 47 mg/dL (ref 0–149)
VLDL Cholesterol Cal: 8 mg/dL (ref 5–40)

## 2020-09-05 NOTE — Progress Notes (Signed)
Virtual Visit via Telephone Note   This visit type was conducted due to national recommendations for restrictions regarding the COVID-19 Pandemic (e.g. social distancing) in an effort to limit this patient's exposure and mitigate transmission in our community.  Due to her co-morbid illnesses, this patient is at least at moderate risk for complications without adequate follow up.  This format is felt to be most appropriate for this patient at this time.  The patient did not have access to video technology/had technical difficulties with video requiring transitioning to audio format only (telephone).  All issues noted in this document were discussed and addressed.  No physical exam could be performed with this format.  Please refer to the patient's chart for her  consent to telehealth for [facility].   Date:  09/05/2020   ID:  Brooke [name], Brooke [name] 1945/08/29, MRN [id number] The patient was identified using 2 identifiers.  Patient Location: Home Provider Location: Office/Clinic  PCP:  [name], MD  Cardiologist:  Skeet Latch, MD  Electrophysiologist:  None   Evaluation Performed:  Follow-Up Visit  Chief Complaint: Hypertension  History of Present Illness:    Brooke [name] is a 75 y.o. female with COPD on home O2, nonobstructive CAD, hyperlipidemia, stage IV lung cancer, GERD, and prior tobacco abuse here for follow-up.  She was initially seen by cardiology 06/2013 when she had an NSTEMI.  Heart cath at that time revealed minimal, non-obstructive CAD.  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  LVEF was 60%.  It was thought that her symptoms were attributable to spasm.  She takes atorvastatin every other day to avoid myalgias.  Amlodipine was started for spasms.  She was diagnosed with lung cancer in 2017.  A bronchoscopy was recommended but she declined at that time.  In 10/2016 she developed progressive symptoms with metastatic disease.  She  completed 35 cycles of chemotherapy with pembrolizumab.  This helped her metastatic disease but not her primary mass.  She was subsequently restarted on chemotherapy with pembrolizumab.  Ms. Smarr saw [name], [place], on 12/2019 with elevated blood pressure.  He recommended watchful waiting at that time.  It was noted that anxiety tended to trigger her blood pressure.  She followed up again 04/2020 and her blood pressure was 140/90.  Last Friday she went for there Keytruda infusion.   At her last appointment her BP was labile.  She has been taking doses of amlodipine ranging from 2.5mg  to 7.5mg  based on her AM BP.  She thinks her BP is also related to anxiety.  Her BP has ranged 87/60-152/83.  She has felt well. She had vertigo and was recently treated for a sinus infection.  The vertigo has now resolved.  She has been walking around her home for exercise.  She gets tired and short of breath but has no chest pain.  She denies LE edema, orthopnea or PND. She continues to get Keytruda infusions.    Past Medical History:  Diagnosis Date  . Chronic respiratory failure (HCC)    a. on home O2.  Marland Kitchen COPD (chronic obstructive pulmonary disease) ([place])    a. Home O2.  . Former tobacco use   . GERD (gastroesophageal reflux disease)   . Hyperlipidemia   . 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 Liver metastases ([place])   . lung ca 10/2016  . 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 NSTEMI (non-ST elevated myocardial infarction) ([place])    a.  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 Coronary vasospasm   Past Surgical History:  Procedure Laterality Date  . LEFT HEART CATHETERIZATION WITH CORONARY ANGIOGRAM N/A 06/08/2013   Procedure: LEFT HEART CATHETERIZATION WITH CORONARY ANGIOGRAM;  Surgeon: Sanda Klein, MD;  Location: [facility];  Service: Cardiovascular;  Laterality: N/A;     Current Meds  Medication Sig  . acetaminophen (TYLENOL) 325 MG tablet Per bottle as  needed  . albuterol (PROVENTIL HFA;VENTOLIN HFA) 108 (90 BASE) MCG/ACT inhaler Inhale 2 puffs into the lungs every 4 (four) hours as needed for wheezing or shortness of breath. **PLAN B**  . ALPRAZolam (XANAX) 0.25 MG tablet 1/2-1 twice daily as needed  . amLODipine (NORVASC) 2.5 MG tablet Take by mouth based on top number of BP. 1 tab (2.5mg ) if BP 140-159, 2 tabs (5mg ) for 160-179, 3 tabs (7.5 mg) for BP >180  . atorvastatin (LIPITOR) 20 MG tablet TAKE 1 TABLET(20 MG) BY MOUTH DAILY AT 6 PM  . budesonide-formoterol (SYMBICORT) 160-4.5 MCG/ACT inhaler Inhale 2 puffs into the lungs 2 (two) times daily.  Marland Kitchen guaiFENesin (MUCINEX) 600 MG 12 hr tablet Take 600 mg by mouth 2 (two) times daily as needed for cough or to loosen phlegm.  Marland Kitchen ibuprofen (ADVIL,MOTRIN) 600 MG tablet Take 600 mg by mouth every 6 (six) hours as needed.  Marland Kitchen ipratropium-albuterol (DUONEB) 0.5-2.5 (3) MG/3ML SOLN Take 3 mLs by nebulization every 4 (four) hours as needed (wheezing/shortness of breath). **PLAN C**  . nitroGLYCERIN (NITROSTAT) 0.4 MG SL tablet ONE TABLET UNDER TONGUE AS NEEDED FOR CHEST PAIN EVERY 5 MINUTES FOR 3 DOSES  . NON FORMULARY [facility]  Onychomycosis Nail Lacquer -  Fluconazole 2%, Terbinafine 1% DMSO/undecylenic acid 25% Apply to affected nail once daily Qty. 120 gm 3 refills  . OXYGEN 2lpm 24/7  . Probiotic Product (PROBIOTIC ADVANCED PO) Take 1 tablet by mouth daily.  . Simethicone 180 MG CAPS Use as needed   Current Facility-Administered Medications for the 09/05/20 encounter (Office Visit) with Skeet Latch, MD  Medication  . methylPREDNISolone acetate (DEPO-MEDROL) injection 80 mg     Allergies:   Codeine, Imdur [isosorbide dinitrate], Prednisone, and Pulmicort [budesonide]   Social History   Tobacco Use  . Smoking status: Former Smoker    Packs/day: 1.00    Years: 30.00    Pack years: 30.00    Types: Cigarettes    Quit date: 05/08/2007    Years since quitting: 13.3  . Smokeless  tobacco: Never Used  Vaping Use  . Vaping Use: Never used  Substance Use Topics  . Alcohol use: Yes    Comment: rarely  . Drug use: No     Family Hx: The patient's family history includes Lung cancer in her father. There is no history of Heart attack, Stroke, or Hypertension.  ROS:   Please see the history of present illness.    All other systems reviewed and are negative.   Prior CV studies:   The following studies were reviewed today:  Echo 06/06/2013: Study Conclusions   Left ventricle: The cavity size was normal. Systolic  function was normal. The estimated ejection fraction was in  the range of 50% to 55%. Regional wall motion abnormalities  cannot be excluded. Doppler parameters are consistent with  abnormal left ventricular relaxation (grade 1 diastolic  dysfunction).     Labs/Other Tests and Data Reviewed:    EKG:  An ECG dated 12/22/19 was personally  reviewed today and demonstrated:  sinus rhythm.  Rate 72 bpm.  Cannot rule out prior septal infarct.  Recent Labs: 09/02/2020: ALT 12; BUN 14; Creatinine 0.51; Hemoglobin 13.7; Platelet Count 207; Potassium 4.0; Sodium 134; TSH 1.454   Recent Lipid Panel Lab Results  Component Value Date/Time   CHOL 190 02/20/2019 12:00 AM   TRIG 45 02/20/2019 12:00 AM   HDL 87 02/20/2019 12:00 AM   CHOLHDL 2.2 02/20/2019 12:00 AM   CHOLHDL 2.2 08/18/2015 10:41 AM   LDLCALC 94 02/20/2019 12:00 AM    Wt Readings from Last 3 Encounters:  09/05/20 96 lb 6.4 oz (43.7 kg)  09/02/20 96 lb 3.2 oz (43.6 kg)  08/22/20 98 lb (44.5 kg)     Risk Assessment/Calculations:      Objective:    VS:  BP 108/62   Pulse 72   Ht 5\' 2"  (1.575 m)   Wt 96 lb 6.4 oz (43.7 kg)   SpO2 94%   BMI 17.63 kg/m  , BMI Body mass index is 17.63 kg/m. GENERAL:  Frail, elderly woman in no acute distress HEENT: Pupils equal round and reactive, fundi not visualized, oral mucosa unremarkable NECK:  No jugular venous distention, waveform within normal  limits, carotid upstroke brisk and symmetric, no bruit LUNGS:  Clear to auscultation bilaterally HEART:  RRR.  PMI not displaced or sustained,S1 and S2 within normal limits, no S3, no S4, no clicks, no rubs, no murmurs ABD:  Flat, positive bowel sounds normal in frequency in pitch, no bruits, no rebound, no guarding, no midline pulsatile mass, no hepatomegaly, no splenomegaly EXT:  2 plus pulses throughout, no edema, no cyanosis no clubbing SKIN:  No rashes no nodules NEURO:  Cranial nerves II through XII grossly intact, motor grossly intact throughout PSYCH:  Cognitively intact, oriented to person place and time   ASSESSMENT & PLAN:    # Essential hypertension:  BP remains labile but reasonably controlled.  She takes amlodipine 2.5mg  for SBP 140-159, 5mg  160-179 and 7.5mg  if SBP >180.  We discussed trying an alterative agent, but she wants to stick with amlodipine.  She was encouraged to keep trying to exercise and is trying to get into pulmonary rehab.  # Myocardial bridging:  Amlodipine as above.  # Hyperlipidemia:  Continue atorvastatin.  Check fasting lipids today.    Medication Adjustments/Labs and Tests Ordered: Current medicines are reviewed at length with the patient today.  Concerns regarding medicines are outlined above.   Tests Ordered: Orders Placed This Encounter  Procedures  . Lipid panel    Medication Changes: No orders of the defined types were placed in this encounter.   Follow Up: with Gaurav Baldree C. Oval Linsey, MD, [facility] in 6 months   Signed, Skeet Latch, MD  09/05/2020 10:28 AM    [place]

## 2020-09-05 NOTE — Patient Instructions (Signed)
Medication Instructions:  Your physician recommends that you continue on your current medications as directed. Please refer to the Current Medication list given to you today.   *If you need a refill on your cardiac medications before your next appointment, please call your pharmacy*  Lab Work: LIPID PANEL TODAY   Testing/Procedures: NONE   Follow-Up: At [facility], you and your health needs are our priority.  As part of our continuing mission to provide you with exceptional heart care, we have created designated Provider Care Teams.  These Care Teams include your primary Cardiologist (physician) and Advanced Practice Providers (APPs -  Physician Assistants and Nurse Practitioners) who all work together to provide you with the care you need, when you need it.  We recommend signing up for the patient portal called "MyChart".  Sign up information is provided on this After Visit Summary.  MyChart is used to connect with patients for Virtual Visits (Telemedicine).  Patients are able to view lab/test results, encounter notes, upcoming appointments, etc.  Non-urgent messages can be sent to your provider as well.   To learn more about what you can do with MyChart, go to [email or web address].    Your next appointment:   6 month(s)  The format for your next appointment:   In Person  Provider:   DR Truxton

## 2020-09-12 ENCOUNTER — Other Ambulatory Visit: Payer: Self-pay

## 2020-09-12 ENCOUNTER — Encounter: Payer: Self-pay | Admitting: Internal Medicine

## 2020-09-12 ENCOUNTER — Ambulatory Visit (INDEPENDENT_AMBULATORY_CARE_PROVIDER_SITE_OTHER): Payer: Medicare Other | Admitting: Internal Medicine

## 2020-09-12 DIAGNOSIS — J9611 Chronic respiratory failure with hypoxia: Secondary | ICD-10-CM

## 2020-09-12 DIAGNOSIS — J441 Chronic obstructive pulmonary disease with (acute) exacerbation: Secondary | ICD-10-CM | POA: Diagnosis not present

## 2020-09-12 DIAGNOSIS — J449 Chronic obstructive pulmonary disease, unspecified: Secondary | ICD-10-CM

## 2020-09-12 MED ORDER — METHYLPREDNISOLONE ACETATE 80 MG/ML IJ SUSP: 120.0000 mg | Freq: Once | INTRAMUSCULAR | Status: DC

## 2020-09-12 NOTE — Assessment & Plan Note (Signed)
rx 2lpm hs and prn daytime since 2014  -  06/13/2018   Walked on 2lpm x  3 laps @  approx 29ft each @ nl pace  stopped due to  Sob after each lap  s desat     rec as of 09/12/2020  = 2lpm hs and prn with activity with goal of keeping sats > 90%          Each maintenance medication was reviewed in detail including emphasizing most importantly the difference between maintenance and prns and under what circumstances the prns are to be triggered using an action plan format where appropriate.  Total time for H and P, chart review, counseling, reviewing 02/hfa  device(s) and generating customized AVS unique to this office visit / same day charting = 25 min

## 2020-09-12 NOTE — Patient Instructions (Signed)
Depomedrol 120 mg IM today  No change other  medications  Make sure you check your oxygen saturation  at your highest level of activity  to be sure it stays over 90% and adjust  02 flow upward to maintain this level if needed but remember to turn it back to previous settings when you stop (to conserve your supply).    Please schedule a follow up visit in 6 months but call sooner if needed

## 2020-09-12 NOTE — Progress Notes (Signed)
Subjective:    Patient ID: Brooke [name], female   DOB: 04/16/1946,    MRN: [id number]     Brief patient profile:  54 yowf quit smoking 2009 on 02 noct 2012 maint on symbicort 160 since aorund  2014 referred to pulmonary clinic 02/15/2016 by Dr   Kathryne Eriksson for RUL Lung mass dx with Stage IV nsc 11/01/16 in setting of GOLD IV copd      History of Present Illness  02/15/2016 1st [facility] office visit/ Danzig Macgregor  GOLD IV copd/ symb 160 2bid maint  Chief Complaint  Patient presents with  . Pulmonary Consult    referred by Dr. Kathryne Eriksson for COPD.  MMRC2 = can't walk a nl pace on a flat grade s sob but does fine slow and flat eg [employer] on 2lpm  New onset bloody mucus plugs November 11 2015 assoc with wt loss  02 2lpm hs and with walking / rare saba needed rec For cough > mucinex up to 1200 mg every 12 hours as needed Rec:  Fob but refused   11/01/16   CT directed bx >>>  Pos NSC LUL    Diagnosis:   Stage IV NSCLC, Squamous cell carcinoma of the left upper lobe.     Indication for treatment:  palliative       Radiation treatment dates:   11/08/2016 to 11/21/2016  Site/dose:   The Left lung was treated to 30 Gy in 10 fractions at 3 Gy per fraction.   Beams/energy:   3D // 10X, 6X    12/06/2016  f/u ov/Riyan Gavina re: post hosp f/u -  Now on dulera 200 bid and duoneb Chief Complaint  Patient presents with  . HFU    Breathing is doing well. She is using Duoneb 2 x daily and uses albuterol inhaler 1-2 x per wk on average.   presently in SNF ever since last admit 6/18-29/18 for  Active Problems:   COPD  GOLD IV    Mass of upper lobe of left lung   Chronic respiratory failure with hypoxia (HCC)   COPD exacerbation (HCC)   Hypokalemia   Microcytic anemia   SOB (shortness of breath)   Protein-calorie malnutrition, severe Doe = 2lpm x 12ft with rolling walking  Sleeping ok at < 30 degrees Doe = 2lpm x 175ft with rolling walker/2lpm Sleeping ok at < 30 degrees   rec 02 can be adjusted down and off for saturations above 90%       12/09/2017  f/u ov/Jadon Harbaugh re: GOLD IV copd/  No 02 at rest/ 2lpm sleep and with activity  Chief Complaint  Patient presents with  . Follow-up    Breathing is overall doing well. She has occ cough with white sputum- relates to PND.  She uses her albuterol inhaler 2 x daily on average. She states she uses neb before she knows she is going somewhere.   Dyspnea:  MMRC2 = can't walk a nl pace on a flat grade s sob but does fine slow and flat eg still pushing cart  Cough: min pnd  SABA use: as above  rec Continue  symbicort and add stiolto 2 pffs each am - - if you don't like it for any reason resume the symbicort alone as per your med calendar  Please schedule a follow up visit in 3 months but call sooner if needed  - add:  Pt notified above typo should have said add spiriva x 2 pffs each am  and continue symbicort Take 2 puffs first thing in am and then another 2 puffs about 12 hours later.   - 12/12/2017 informed could not tol spiriva due to dry mouth   03/14/2018  f/u ov/Shamikia Linskey re:  GOLD IV / 02 is 2lpm  Hs and 2lpm exertion/   RA sitting / just on symbicort 160 2bid  Chief Complaint  Patient presents with  . Follow-up    increased cough with white sputum. She states her breathing has been doing well. She is using her albuterol inhaler 2 x per wk and neb maybe 2 x  per wk on average.    Dyspnea:  [employer] ok pushing cart on 2lpm  Cough: more since 2 weeks  Sleeping: flat bed/ 2-3 pillow  SABA use: avg as above 02: as above   rec Work on inhaler technique:   Ok to take augmentin unless it makes you nauseated  Please schedule a follow up visit in 3 months but call sooner if needed      06/13/2018  f/u ov/Ulani Degrasse re: GOLD IV / 02 hs and prn  Chief Complaint  Patient presents with  . Shortness of Breath    Worse since the last visit.  Dyspnea:  Still doing [employer]  On 2lpm but not checking sats as rec with activity   Cough: dry worse day than noct Sleeping: falt bed / couple of pillows SABA use: helps some / uses once a day esp in am 02: 2lpm hs and prn  rec Plan A = Automatic = symbicort 160 Take 2 puffs first thing in am and then another 2 puffs about 12 hours later.  Work on inhaler technique: Plan B = Backup Only use your albuterol inhaler as a rescue medication Plan C = Crisis - only use your albuterol/ipatropium nebulizer if you first try Plan B and it fails to help > ok to use the nebulizer up to every 4 hours but if start needing it regularly call for immediate appointment Please schedule a follow up visit in 3 months but call sooner if needed  - add chart review does not show alpha one screen done    03/05/2019 acute ext ov/Otelia Hettinger re:  GOLD IV / 02 dep  Chief Complaint  Patient presents with  . Acute Visit    Increased cough and SOB for the past 2 wks. She has already been treated with zpack. She is using her albuterol inhaler several x per day and neb a couple times per day.   Dyspnea:  Getting worse x months but esp x 2 weeks  Cough: yellow mucus x 2 weeks and no change p zpak  Sleeping: bed is flat, sev pillows SABA use: some better  02: 2lpm 24/7  rec Depomedrol 120 mg IM today  Levaquin 500 mg daily x 7 days  Please remember to go to the  x-ray department  for your tests - we will call you with the results when they are available     11/30/220 televisit, new hoarse rec  gerd rx > ENT F/u > sq cell ca vc    Last RT April 20th 2021 in [place]    09/15/2019  f/u ov/Kieffer Blatz re: worse sob and hoarseness p RT  Chief Complaint  Patient presents with  . Follow-up    Pt c/o increased SOB and coughing up frothy sputum x 3 wks- relates to RT.    Dyspnea:  Across the room x 3 weeks Cough: more frothy  Sleeping: flat bed 3 pillows  SABA use: hfa and duoneb  02: 2lpm hs and walking and not at rest  Eating ok / no choking on  Food but feels something stuck in throat making  swallowing difficult rec Depomedrol 120 mg IM today  Levaquin 500 mg daily x 7 days  Try prilosec otc 20mg   Take 30-60 min before first meal of the day and Pepcid ac (famotidine) 20 mg one @  bedtime until cough is completely gone for at least a week without the need for cough suppression GERD Keep your previous appt   / see ENT asap    10/16/2019  f/u ov/Nazanin Kinner re: GOLD IV copd/ 02 dep / has not seen ent  Chief Complaint  Patient presents with  . Follow-up    COPD, no new issues  Dyspnea:  60 ft x 10 laps s  stopping on 2lpm and says sats stay in 90s   Cough: better  Sleeping: bed is flat/ 3 pillows  SABA use: occ  02: 2lpm 24/7  L cp comes and goes positional no pleuritic or ex related  rec 2lpm bedtime but goal is > 90% saturation 24/7 so adjust during the day  Follow up with ENT as soon as you can    01/18/2020  f/u ov/Fatmata Legere re:  GOLD IV / 02 dep  plans to see marcellino sept 27 p completing RT for VC cancer and now on Ketruda for dz progression  Chief Complaint  Patient presents with  . Follow-up    reports episode of vertigo about 2 weeks ago that has improved since onset   Dyspnea:  Still doing 60 ft circuits but slowed down due to vertigo with sats typically ok on 2lpm  Cough: sometimes related to pnds Sleeping: bed is flat, 3 pillows  SABA use:   02: 2lpm 24/7   rec No change in symbicort as your maintenance  Only use your albuterol (either the puffer or the nebulizer) as a rescue medication   06/06/2020  f/u ov/Telford Archambeau re:  GOLD IV / 02 dep  Chief Complaint  Patient presents with  . Follow-up    Sob same, vertigo, did not start Breztri yet   Dyspnea:  Still doing 60 ft circuit x 30 most days   Slowed by "asthma feeling" on symbicort  Cough: very hoarse, no excess mucus but some pnds  Sleeping:  Bed is flat and 2 pillows  SABA use: both hfa and nebs but never pre or rechallenges   02: 2lpm 24/7 does not check sats at peak ex rec Try albuterol 15 min before an  activity that you know would make you short of breath and see if it makes any difference and if makes none then don't take it after activity unless you can't catch your breath. Start Breztri 2 in am and symbicort x 2 pffs 12 hours later - once you finish the symbicort, use breztri just like you did the symbicort  Make sure you check your oxygen saturation  at your highest level of activity     Referred to pulmonary rehab 08/22/20  > has not heard from rehab as of 09/12/2020     09/12/2020  f/u ov/Emmalyn Hinson re: GOLD IV / 02 dep  Chief Complaint  Patient presents with  . Follow-up    Breathing has improved some on Breztri. Her cough has been more prod over the past few days- yellow to white sputum.  She is using her albuterol inhaler and neb  with albuterol both 1-2 x per day.   Dyspnea:  Still doing 60 ft circuit  X 30 not really sure it helps  Cough: was yellow now but still thick p recent augmentin rx  Sleeping: bed is flat and has 2 pillows no resp problems  SABA use: as above but  none noct  02: 2lpm  Covid status:  vax x 3    No obvious day to day or daytime variability or assoc  mucus plugs or hemoptysis or cp or chest tightness, subjective wheeze or overt sinus or hb symptoms.   Sleeping as bove  without nocturnal  or early am exacerbation  of respiratory  c/o's or need for noct saba. Also denies any obvious fluctuation of symptoms with weather or environmental changes or other aggravating or alleviating factors except as outlined above   No unusual exposure hx or h/o childhood pna/ asthma or knowledge of premature birth.  Current Allergies, Complete Past Medical History, Past Surgical History, Family History, and Social History were reviewed in [employer] record.  ROS  The following are not active complaints unless bolded Hoarseness, sore throat, dysphagia, dental problems, itching, sneezing,  nasal congestion or discharge of excess mucus or purulent secretions, ear  ache,   fever, chills, sweats, unintended wt loss or wt gain, classically pleuritic or exertional cp,  orthopnea pnd or arm/hand swelling  or leg swelling, presyncope, palpitations, abdominal pain, anorexia, nausea, vomiting, diarrhea  or change in bowel habits or change in bladder habits, change in stools or change in urine, dysuria, hematuria,  rash, arthralgias, visual complaints, headache, numbness, weakness or ataxia or problems with walking or coordination,  change in mood or  memory.        Current Meds  Medication Sig  . acetaminophen (TYLENOL) 325 MG tablet Per bottle as needed  . albuterol (PROVENTIL HFA;VENTOLIN HFA) 108 (90 BASE) MCG/ACT inhaler Inhale 2 puffs into the lungs every 4 (four) hours as needed for wheezing or shortness of breath. **PLAN B**  . ALPRAZolam (XANAX) 0.25 MG tablet 1/2-1 twice daily as needed  . amLODipine (NORVASC) 2.5 MG tablet Take by mouth based on top number of BP. 1 tab (2.5mg ) if BP 140-159, 2 tabs (5mg ) for 160-179, 3 tabs (7.5 mg) for BP >180  . atorvastatin (LIPITOR) 20 MG tablet TAKE 1 TABLET(20 MG) BY MOUTH DAILY AT 6 PM  . Budeson-Glycopyrrol-Formoterol (BREZTRI AEROSPHERE) 160-9-4.8 MCG/ACT AERO Inhale 2 puffs into the lungs every 12 (twelve) hours.  Marland Kitchen guaiFENesin (MUCINEX) 600 MG 12 hr tablet Take 600 mg by mouth 2 (two) times daily as needed for cough or to loosen phlegm.  Marland Kitchen ibuprofen (ADVIL,MOTRIN) 600 MG tablet Take 600 mg by mouth every 6 (six) hours as needed.  Marland Kitchen ipratropium-albuterol (DUONEB) 0.5-2.5 (3) MG/3ML SOLN Take 3 mLs by nebulization every 4 (four) hours as needed (wheezing/shortness of breath). **PLAN C**  . nitroGLYCERIN (NITROSTAT) 0.4 MG SL tablet ONE TABLET UNDER TONGUE AS NEEDED FOR CHEST PAIN EVERY 5 MINUTES FOR 3 DOSES  . NON FORMULARY [facility]  Onychomycosis Nail Lacquer -  Fluconazole 2%, Terbinafine 1% DMSO/undecylenic acid 25% Apply to affected nail once daily Qty. 120 gm 3 refills  . OXYGEN 2lpm 24/7  .  Probiotic Product (PROBIOTIC ADVANCED PO) Take 1 tablet by mouth daily.  . Simethicone 180 MG CAPS Use as needed   Current Facility-Administered Medications for the 09/12/20 encounter (Office Visit) with Tanda Rockers, MD  Medication  . methylPREDNISolone acetate (DEPO-MEDROL) injection  80 mg              Objective:   Physical Exam     09/12/2020         96 06/06/2020        100 01/18/2020          92 10/16/2019          93  03/05/2019      99  06/13/2018         123  03/14/2018       127  12/09/2017         125   06/10/2017         119  01/08/2017         112   12/06/2016        110   02/15/16 119 lb (54 kg)  08/18/15 137 lb 12.8 oz (62.5 kg)  02/17/15 138 lb 1.9 oz (62.7 kg)    Vital signs reviewed  09/12/2020  - Note at rest 02 sats  99% on 1.5 lpm pulsed  General appearance:    amb hoarse wf classic pseudowheeze better with plm    HEENT : pt wearing mask not removed for exam due to covid -19 concerns.    NECK :  without JVD/Nodes/TM/ nl carotid upstrokes bilaterally   LUNGS: no acc muscle use,  Mod barrel  contour chest wall with bilateral  Distant bs s audible wheeze and  without cough on insp or exp maneuvers and mod  Hyperresonant  to  percussion bilaterally     CV:  RRR  no s3 or murmur or increase in P2, and no edema   ABD:  soft and nontender with pos mid insp Hoover's  in the supine position. No bruits or organomegaly appreciated, bowel sounds nl  MS:     ext warm without deformities, calf tenderness, cyanosis or clubbing No obvious joint restrictions   SKIN: warm and dry without lesions    NEURO:  alert, approp, nl sensorium with  no motor or cerebellar deficits apparent.            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 Compensatory hyperexpansion of the left lower lobe. Lungs are otherwise clear bilaterally       Assessment:

## 2020-09-12 NOTE — Assessment & Plan Note (Addendum)
Quit smoking 2009 Spirometry 02/15/2016  FEV1 0.60 (28%)  Ratio 38 with classic curvature  p am symbicort 160  - 01/08/2017    continue dulera 200 2bid  - 12/09/2017  After extensive coaching inhaler device  effectiveness =    90% with smi >add spiriva to  symbicort  - 12/12/2017 informed could not tol spiriva due to dry mouth - - 06/13/2018   continue symbicort 160 - 01/18/2020  After extensive coaching inhaler device,  effectiveness =  90%  - 06/06/2020 changed to breztri  Referred to pulmonary rehab 08/22/20  > has not heard from rehab as of 09/12/2020      Group D in terms of symptom/risk and laba/lama/ICS  therefore appropriate rx at this point >>>  breztri plus appropriate saba - having mild flare so rec depomedrol 120 mg IM as can't tol prednisone.   Reminded re use of purse lips to help with upper airway "wheeze"   Re saba:  I spent extra time with pt today reviewing appropriate use of albuterol for prn use on exertion with the following points: 1) saba is for relief of sob that does not improve by walking a slower pace or resting but rather if the pt does not improve after trying this first. 2) If the pt is convinced, as many are, that saba helps recover from activity faster then it's easy to tell if this is the case by re-challenging : ie stop, take the inhaler, then p 5 minutes try the exact same activity (intensity of workload) that just caused the symptoms and see if they are substantially diminished or not after saba 3) if there is an activity that reproducibly causes the symptoms, try the saba 15 min before the activity on alternate days   If in fact the saba really does help, then fine to continue to use it prn but advised may need to look closer at the maintenance regimen being used to achieve better control of airways disease with exertion.

## 2020-09-13 ENCOUNTER — Other Ambulatory Visit: Payer: Self-pay

## 2020-09-13 DIAGNOSIS — Z79899 Other long term (current) drug therapy: Secondary | ICD-10-CM

## 2020-09-13 DIAGNOSIS — I2510 Atherosclerotic heart disease of native coronary artery without angina pectoris: Secondary | ICD-10-CM

## 2020-09-13 DIAGNOSIS — E7800 Pure hypercholesterolemia, unspecified: Secondary | ICD-10-CM

## 2020-09-15 ENCOUNTER — Encounter: Payer: Self-pay | Admitting: *Deleted

## 2020-09-15 LAB — RESPIRATORY CULTURE OR RESPIRATORY AND SPUTUM CULTURE
MICRO NUMBER:: 11865719
RESULT:: NORMAL
SPECIMEN QUALITY:: ADEQUATE

## 2020-09-15 NOTE — Progress Notes (Signed)
Please let patient know Sputum culture grew normal oropharyngeal flora.

## 2020-09-16 ENCOUNTER — Telehealth: Payer: Self-pay | Admitting: Primary Care

## 2020-09-16 MED ORDER — BREZTRI AEROSPHERE 160-9-4.8 MCG/ACT IN AERO: 2.0000 | INHALATION_SPRAY | Freq: Two times a day (BID) | RESPIRATORY_TRACT | 5 refills | Status: DC

## 2020-09-16 NOTE — Telephone Encounter (Signed)
Called and spoke with pt to see which med was supposed to have been sent to pharmacy for her and she said that she had received samples of the [facility] inhaler and thought Rx was to be sent to pharmacy. I verified preferred pharmacy and sent Rx in for pt. Nothing further needed.

## 2020-09-18 ENCOUNTER — Other Ambulatory Visit: Payer: Self-pay | Admitting: Oncology

## 2020-09-21 ENCOUNTER — Telehealth (HOSPITAL_COMMUNITY): Payer: Self-pay

## 2020-09-21 NOTE — Telephone Encounter (Signed)
Pt insurance is active and benefits verified through [place]. Co-pay $30.00, DED $0.00/$0.00 met, out of pocket $3,900.00/$3,900.00 met, co-insurance 0%. No pre-authorization required. [employer], 09/20/20 @ 10:26AM, REF#[id number]  Will contact patient to see if she is interested in the Pulmonary Rehab Program.

## 2020-09-21 NOTE — Telephone Encounter (Signed)
Called patient to see if she is interested in the Pulmonary Rehab Program. Patient expressed interest. Explained scheduling process and went over insurance, patient verbalized understanding. Will contact patient for scheduling at a later date. (1-3 months) 

## 2020-09-23 ENCOUNTER — Inpatient Hospital Stay: Payer: Medicare Other | Attending: Oncology

## 2020-09-23 ENCOUNTER — Inpatient Hospital Stay: Payer: Medicare Other

## 2020-09-23 ENCOUNTER — Encounter: Payer: Self-pay | Admitting: Nurse Practitioner

## 2020-09-23 ENCOUNTER — Other Ambulatory Visit: Payer: Self-pay

## 2020-09-23 ENCOUNTER — Inpatient Hospital Stay (HOSPITAL_BASED_OUTPATIENT_CLINIC_OR_DEPARTMENT_OTHER): Payer: Medicare Other | Admitting: Nurse Practitioner

## 2020-09-23 VITALS — BP 157/87 | HR 79 | Temp 98.2°F | Resp 18 | Ht 62.0 in | Wt 95.8 lb

## 2020-09-23 DIAGNOSIS — Z79899 Other long term (current) drug therapy: Secondary | ICD-10-CM | POA: Insufficient documentation

## 2020-09-23 DIAGNOSIS — Z5112 Encounter for antineoplastic immunotherapy: Secondary | ICD-10-CM | POA: Insufficient documentation

## 2020-09-23 DIAGNOSIS — I252 Old myocardial infarction: Secondary | ICD-10-CM | POA: Diagnosis not present

## 2020-09-23 DIAGNOSIS — C7889 Secondary malignant neoplasm of other digestive organs: Secondary | ICD-10-CM | POA: Insufficient documentation

## 2020-09-23 DIAGNOSIS — C3412 Malignant neoplasm of upper lobe, left bronchus or lung: Secondary | ICD-10-CM | POA: Insufficient documentation

## 2020-09-23 DIAGNOSIS — J449 Chronic obstructive pulmonary disease, unspecified: Secondary | ICD-10-CM | POA: Insufficient documentation

## 2020-09-23 DIAGNOSIS — Z9981 Dependence on supplemental oxygen: Secondary | ICD-10-CM | POA: Diagnosis not present

## 2020-09-23 LAB — CBC WITH DIFFERENTIAL (CANCER CENTER ONLY)
Abs Immature Granulocytes: 0.01 10*3/uL (ref 0.00–0.07)
Basophils Absolute: 0 10*3/uL (ref 0.0–0.1)
Basophils Relative: 0 %
Eosinophils Absolute: 0.1 10*3/uL (ref 0.0–0.5)
Eosinophils Relative: 2 %
HCT: 41.4 % (ref 36.0–46.0)
Hemoglobin: 13.2 g/dL (ref 12.0–15.0)
Immature Granulocytes: 0 %
Lymphocytes Relative: 17 %
Lymphs Abs: 0.9 10*3/uL (ref 0.7–4.0)
MCH: 29.2 pg (ref 26.0–34.0)
MCHC: 31.9 g/dL (ref 30.0–36.0)
MCV: 91.6 fL (ref 80.0–100.0)
Monocytes Absolute: 0.4 10*3/uL (ref 0.1–1.0)
Monocytes Relative: 7 %
Neutro Abs: 3.7 10*3/uL (ref 1.7–7.7)
Neutrophils Relative %: 74 %
Platelet Count: 219 10*3/uL (ref 150–400)
RBC: 4.52 MIL/uL (ref 3.87–5.11)
RDW: 13.3 % (ref 11.5–15.5)
WBC Count: 5 10*3/uL (ref 4.0–10.5)
nRBC: 0 % (ref 0.0–0.2)

## 2020-09-23 LAB — CMP (CANCER CENTER ONLY)
ALT: 16 U/L (ref 0–44)
AST: 17 U/L (ref 15–41)
Albumin: 4 g/dL (ref 3.5–5.0)
Alkaline Phosphatase: 47 U/L (ref 38–126)
Anion gap: 6 (ref 5–15)
BUN: 14 mg/dL (ref 8–23)
CO2: 29 mmol/L (ref 22–32)
Calcium: 8.8 mg/dL — ABNORMAL LOW (ref 8.9–10.3)
Chloride: 100 mmol/L (ref 98–111)
Creatinine: 0.51 mg/dL (ref 0.44–1.00)
GFR, Estimated: >60 mL/min (ref >60)
Glucose, Bld: 90 mg/dL (ref 70–99)
Potassium: 4 mmol/L (ref 3.5–5.1)
Sodium: 135 mmol/L (ref 135–145)
Total Bilirubin: 0.3 mg/dL (ref 0.3–1.2)
Total Protein: 6.9 g/dL (ref 6.5–8.1)

## 2020-09-23 MED ORDER — SODIUM CHLORIDE 0.9 % IV SOLN
200.0000 mg | Freq: Once | INTRAVENOUS | Status: AC
  Administered 2020-09-23: 200 mg via INTRAVENOUS
  Filled 2020-09-23: qty 8

## 2020-09-23 MED ORDER — SODIUM CHLORIDE 0.9 % IV SOLN
Freq: Once | INTRAVENOUS | Status: AC
  Filled 2020-09-23: qty 250

## 2020-09-23 MED ORDER — SODIUM CHLORIDE 0.9% FLUSH
10.0000 mL | INTRAVENOUS | Status: DC | PRN
  Filled 2020-09-23: qty 10

## 2020-09-23 NOTE — Patient Instructions (Signed)
[facility]    Discharge Instructions:  Thank you for choosing [facility] to provide your oncology and hematology care.   If you have a lab appointment with the [facility], please go directly to the [facility] and check in at the registration area.   Wear comfortable clothing and clothing appropriate for easy access to any Portacath or PICC line.   We strive to give you quality time with your provider. You may need to reschedule your appointment if you arrive late (15 or more minutes).  Arriving late affects you and other patients whose appointments are after yours.  Also, if you miss three or more appointments without notifying the office, you may be dismissed from the clinic at the provider's discretion.      For prescription refill requests, have your pharmacy contact our office and allow 72 hours for refills to be completed.    Today you received the following chemotherapy and/or immunotherapy agents Pembrolizumab (KEYTRUDA).    To help prevent nausea and vomiting after your treatment, we encourage you to take your nausea medication as directed.  BELOW ARE SYMPTOMS THAT SHOULD BE REPORTED IMMEDIATELY: . *FEVER GREATER THAN 100.4 F (38 C) OR HIGHER . *CHILLS OR SWEATING . *NAUSEA AND VOMITING THAT IS NOT CONTROLLED WITH YOUR NAUSEA MEDICATION . *UNUSUAL SHORTNESS OF BREATH . *UNUSUAL BRUISING OR BLEEDING . *URINARY PROBLEMS (pain or burning when urinating, or frequent urination) . *BOWEL PROBLEMS (unusual diarrhea, constipation, pain near the anus) . TENDERNESS IN MOUTH AND THROAT WITH OR WITHOUT PRESENCE OF ULCERS (sore throat, sores in mouth, or a toothache) . UNUSUAL RASH, SWELLING OR PAIN  . UNUSUAL VAGINAL DISCHARGE OR ITCHING   Items with * indicate a potential emergency and should be followed up as soon as possible or go to the Emergency Department if any problems should occur.  Please show the CHEMOTHERAPY ALERT CARD or  IMMUNOTHERAPY ALERT CARD at check-in to the Emergency Department and triage nurse.  Should you have questions after your visit or need to cancel or reschedule your appointment, please contact [facility]  Dept: [phone]  and follow the prompts.  Office hours are 8:00 a.m. to 4:30 p.m. Monday - Friday. Please note that voicemails left after 4:00 p.m. may not be returned until the following business day.  We are closed weekends and major holidays. You have access to a nurse at all times for urgent questions. Please call the main number to the clinic Dept: [phone] and follow the prompts.   For any non-urgent questions, you may also contact your provider using MyChart. We now offer e-Visits for anyone 76 and older to request care online for non-urgent symptoms. For details visit [email or web address].   Also download the MyChart app! Go to the app store, search "MyChart", open the app, select [facility], and log in with your MyChart username and password.  Due to Covid, a mask is required upon entering the hospital/clinic. If you do not have a mask, one will be given to you upon arrival. For doctor visits, patients may have 1 support person aged 32 or older with them. For treatment visits, patients cannot have anyone with them due to current Covid guidelines and our immunocompromised population.   Pembrolizumab injection What is this medicine? PEMBROLIZUMAB (pem broe liz ue mab) is a monoclonal antibody. It is used to treat certain types of cancer. This medicine may be used for other purposes; ask your health care  provider or pharmacist if you have questions. COMMON BRAND NAME(S): Keytruda What should I tell my health care provider before I take this medicine? They need to know if you have any of these conditions:  autoimmune diseases like Crohn's disease, ulcerative colitis, or lupus  have had or planning to have an allogeneic stem cell transplant (uses  someone else's stem cells)  history of organ transplant  history of chest radiation  nervous system problems like myasthenia gravis or Guillain-Barre syndrome  an unusual or allergic reaction to pembrolizumab, other medicines, foods, dyes, or preservatives  pregnant or trying to get pregnant  breast-feeding How should I use this medicine? This medicine is for infusion into a vein. It is given by a health care professional in a hospital or clinic setting. A special MedGuide will be given to you before each treatment. Be sure to read this information carefully each time. Talk to your pediatrician regarding the use of this medicine in children. While this drug may be prescribed for children as young as 6 months for selected conditions, precautions do apply. Overdosage: If you think you have taken too much of this medicine contact a poison control center or emergency room at once. NOTE: This medicine is only for you. Do not share this medicine with others. What if I miss a dose? It is important not to miss your dose. Call your doctor or health care professional if you are unable to keep an appointment. What may interact with this medicine? Interactions have not been studied. This list may not describe all possible interactions. Give your health care provider a list of all the medicines, herbs, non-prescription drugs, or dietary supplements you use. Also tell them if you smoke, drink alcohol, or use illegal drugs. Some items may interact with your medicine. What should I watch for while using this medicine? Your condition will be monitored carefully while you are receiving this medicine. You may need blood work done while you are taking this medicine. Do not become pregnant while taking this medicine or for 4 months after stopping it. Women should inform their doctor if they wish to become pregnant or think they might be pregnant. There is a potential for serious side effects to an unborn  child. Talk to your health care professional or pharmacist for more information. Do not breast-feed an infant while taking this medicine or for 4 months after the last dose. What side effects may I notice from receiving this medicine? Side effects that you should report to your doctor or health care professional as soon as possible:  allergic reactions like skin rash, itching or hives, swelling of the face, lips, or tongue  bloody or black, tarry  breathing problems  changes in vision  chest pain  chills  confusion  constipation  cough  diarrhea  dizziness or feeling faint or lightheaded  fast or irregular heartbeat  fever  flushing  joint pain  low blood counts - this medicine may decrease the number of white blood cells, red blood cells and platelets. You may be at increased risk for infections and bleeding.  muscle pain  muscle weakness  pain, tingling, numbness in the hands or feet  persistent headache  redness, blistering, peeling or loosening of the skin, including inside the mouth  signs and symptoms of high blood sugar such as dizziness; dry mouth; dry skin; fruity breath; nausea; stomach pain; increased hunger or thirst; increased urination  signs and symptoms of kidney injury like trouble passing urine or change  in the amount of urine  signs and symptoms of liver injury like dark urine, light-colored stools, loss of appetite, nausea, right upper belly pain, yellowing of the eyes or skin  sweating  swollen lymph nodes  weight loss Side effects that usually do not require medical attention (report to your doctor or health care professional if they continue or are bothersome):  decreased appetite  hair loss  tiredness This list may not describe all possible side effects. Call your doctor for medical advice about side effects. You may report side effects to FDA at 1-800-FDA-1088. Where should I keep my medicine? This drug is given in a hospital  or clinic and will not be stored at home. NOTE: This sheet is a summary. It may not cover all possible information. If you have questions about this medicine, talk to your doctor, pharmacist, or health care provider.  2021 Elsevier/Gold Standard (2019-03-25 21:44:53)

## 2020-09-23 NOTE — Progress Notes (Signed)
[facility] OFFICE PROGRESS NOTE   Diagnosis: Non-small cell lung cancer  INTERVAL HISTORY:   Brooke [name] returns as scheduled.  She completed another cycle of Pembrolizumab 09/02/2020.  No rash or diarrhea.  She has had no further episodes of chest pain.  No nausea or vomiting.  She recently started a new inhaler and has noticed some improvement in dyspnea.  She complains of intermittent discomfort at the left side of her throat since beginning the new inhaler.  Objective:  Vital signs in last 24 hours:  Blood pressure (!) 157/87, pulse 79, temperature 98.2 F (36.8 C), temperature source Oral, resp. rate 18, height '5\' 2"'  (1.575 m), weight 95 lb 12.8 oz (43.5 kg), SpO2 99 %.    HEENT: No thrush or ulcers.  Posterior pharynx is without erythema. Resp: Lungs clear bilaterally. Cardio: Regular rate and rhythm. GI: No hepatosplenomegaly. Vascular: No leg edema.  Skin: No rash.   Lab Results:  Lab Results  Component Value Date   WBC 4.4 09/02/2020   HGB 13.7 09/02/2020   HCT 42.5 09/02/2020   MCV 89.9 09/02/2020   PLT 207 09/02/2020   NEUTROABS 2.9 09/02/2020    Imaging:  No results found.  Medications: I have reviewed the patient's current medications.  Assessment/Plan: 1. 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  PDL1 60%  Left lung radiation 11/08/2016 through 11/21/2016  CT chest 12/12/2016-reexpansion of left upper lobe with a decreased left upper lobe mass, unchanged mediastinal adenopathy, progression of a splenic metastasis/pancreatic  tail/gastrohepatic ligament metastasis, right lower lobe pneumonia  Cycle 1 Pembrolizumab 12/14/2016  Cycle 2 Pembrolizumab 01/03/2017  Cycle 3 Pembrolizumab 01/24/2017  Cycle 4 Pembrolizumab 02/12/2017  CT 03/05/2018-decrease in left upper lobe mass, mediastinal adenopathy, and splenic mass  Cycle 5 pembrolizumab 03/07/2017  Cycle 6 Pembrolizumab 04/02/2017  Cycle 7 pembrolizumab 04/25/2017  Cycle 8 Pembrolizumab 05/14/2017  Cycle 9 Pembrolizumab 06/06/2017  CT 06/20/2017-mild decrease in left upper lobe mass, mild increase in paramediastinal left upper lobe nodule-radiation change?, decreased splenic metastasis  Cycle 10 pembrolizumab 06/25/2017  Cycle 11 pembrolizumab 07/18/2017  Cycle 12 pembrolizumab 08/29/2017  Cycle 13 pembrolizumab 09/17/2017  Cycle 14 Pembrolizumab 10/10/2017  CT chest 10/24/2017-slight enlargement of small mediastinal lymph nodes, increased left upper lobe consolidation, decreased size of spleen lesion  Cycle 15 pembrolizumab 10/29/2017  Cycle 16 Pembrolizumab 11/21/2017  Cycle 17 Pembrolizumab 12/11/2017  Cycle 18 pembrolizumab 01/02/2018  Cycle 19 Pembrolizumab 01/21/2018  Cycle 20 Pembrolizumab 02/13/2018  CT chest 02/27/2018-decreased left paratracheal node, progressive consolidation/fibrosis in the left upper lung, decreased size of splenic mass  Cycle 21 pembrolizumab 03/04/2018  Cycle 22 Pembrolizumab 03/27/2018  Cycle 23 pembrolizumab 04/15/2018  Cycle 24 pembrolizumab 05/08/2018  Cycle 25 Pembrolizumab 05/27/2018  CT chest 06/16/2018-stable left upper lung radiation fibrosis with no evidence of local tumor recurrence. Decreased left paratracheal adenopathy. No new or progressive metastatic disease in the chest. Gastrohepatic ligament lymphadenopathy stable. Splenic metastasis decreased. T5 vertebral compression fracture with associated patchy sclerosis and no discrete osseous lesion, new in the interval.  Cycle 26 Pembrolizumab  06/19/2018  Cycle 27 pembrolizumab 07/08/2018  Cycle 28 pembrolizumab 07/31/2018  Cycle 29 pembrolizumab 08/19/2018  Cycle 30  Pembrolizumab 09/11/2018  Cycle 31 pembrolizumab 09/30/2018  Cycle 32 pembrolizumab 10/23/2018  CT chest 10/28/2018-left apical pleural-parenchymal opacity consistent with radiation scarring has become more confluent likely representing evolutionary change. Continued further decrease in size of index left paratracheal node and splenic metastasis. Gastrohepatic ligament lymph node not changed.  Cycle 33 Pembrolizumab 11/11/2018  Cycle 34 pembrolizumab 12/04/2018  Cycle 35 pembrolizumab 12/23/2018  CT chest 02/12/2019-posttreatment changes left upper lobe unchanged. Continued decrease in size of splenic metastasis. Stable appearance of left paratracheal lymph nodes. Stable gastrohepatic adenopathy. Incomplete visualization of retroperitoneal lymph nodes in the upper abdomen.  CT chest 07/02/2019-stable post treatment related changes left lung. 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 Slight regression of enlarged likely metastatic gastrohepatic lymph node.  CT chest 11/11/2019-interval enlargement of a right paratracheal lymph node measuring 14 mm, increased from 5 mm. No new lung mass or nodularity. New adenopathy in the upper abdomen. 2.7 cm lesion gastrohepatic ligament. Necrotic node at the splenic hilum measures 4.7 cm. Large node along the IVC left upper quadrant measures 2.7 cm. Similar node left adjacent the aorta measures 1.8 cm. Enhancing lesion in the spleen is unchanged. Several low-density lesions in the liver are unchanged.  Pembrolizumab resumed on a 3-week schedule 12/03/2019  CT chest 02/22/2020-interval resolution of previously demonstrated left paratracheal and upper abdominal adenopathy. Stable treated metastasis within the spleen. Stable radiation changes in the left lung with left hilar distortion. No evidence of local recurrence or  progressive metastatic disease.  Continuation of pembrolizumab every 3 weeks 02/26/2020  CT chest 06/28/2020-no change in left upper lobe consolidation, unchanged spleen metastasis, no evidence of progressive disease  Pembrolizumab continued every 3 weeks 07/01/2020   2. 05/29/2019 laryngoscopy-exophytic appearing mass mid right vocal cord.   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 Tumor cells negative for p16, CMV and HSV 2. 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   Radiation 07/29/2019-08/25/2019  3. History of acute respiratory failure secondary to #1  4. Oxygen dependent COPD  5. History of a NSTEMI 2015  6. Right lung pneumonia on chest CT 12/12/2016-treated with Levaquin  7.Grade 2 rash possibly related to Pembrolizumab. Treatment held 08/06/2017.Improved 08/29/2017, treatment resumed, rash has resolved  8. Vertigo January 2022-improved with course of Augmentin for ear/sinus infection    Disposition: Brooke [name] appears unchanged.  She is on active treatment with every 3-week Pembrolizumab.  There is no clinical evidence of disease progression.  Plan to proceed with treatment today as scheduled.  Restaging chest CT in approximately 6 weeks.  She will return for lab, follow-up, Pembrolizumab in 3 weeks.  She will contact the office in the interim with any problems.    [name] ANP/GNP-BC   09/23/2020  9:54 AM

## 2020-10-06 ENCOUNTER — Other Ambulatory Visit: Payer: Self-pay | Admitting: Oncology

## 2020-10-14 ENCOUNTER — Inpatient Hospital Stay (HOSPITAL_BASED_OUTPATIENT_CLINIC_OR_DEPARTMENT_OTHER): Payer: Medicare Other | Admitting: Nurse Practitioner

## 2020-10-14 ENCOUNTER — Encounter: Payer: Self-pay | Admitting: Nurse Practitioner

## 2020-10-14 ENCOUNTER — Inpatient Hospital Stay: Payer: Medicare Other

## 2020-10-14 ENCOUNTER — Inpatient Hospital Stay: Payer: Medicare Other | Attending: Oncology

## 2020-10-14 ENCOUNTER — Other Ambulatory Visit: Payer: Self-pay

## 2020-10-14 VITALS — BP 158/85 | HR 80 | Temp 97.7°F | Resp 18 | Ht 62.0 in | Wt 96.2 lb

## 2020-10-14 DIAGNOSIS — J440 Chronic obstructive pulmonary disease with acute lower respiratory infection: Secondary | ICD-10-CM | POA: Insufficient documentation

## 2020-10-14 DIAGNOSIS — R070 Pain in throat: Secondary | ICD-10-CM | POA: Insufficient documentation

## 2020-10-14 DIAGNOSIS — I252 Old myocardial infarction: Secondary | ICD-10-CM | POA: Insufficient documentation

## 2020-10-14 DIAGNOSIS — C3412 Malignant neoplasm of upper lobe, left bronchus or lung: Secondary | ICD-10-CM

## 2020-10-14 DIAGNOSIS — Z9981 Dependence on supplemental oxygen: Secondary | ICD-10-CM | POA: Diagnosis not present

## 2020-10-14 DIAGNOSIS — C7889 Secondary malignant neoplasm of other digestive organs: Secondary | ICD-10-CM | POA: Insufficient documentation

## 2020-10-14 DIAGNOSIS — Z79899 Other long term (current) drug therapy: Secondary | ICD-10-CM | POA: Diagnosis not present

## 2020-10-14 DIAGNOSIS — Z5112 Encounter for antineoplastic immunotherapy: Secondary | ICD-10-CM | POA: Diagnosis present

## 2020-10-14 LAB — CMP (CANCER CENTER ONLY)
ALT: 13 U/L (ref 0–44)
AST: 15 U/L (ref 15–41)
Albumin: 4 g/dL (ref 3.5–5.0)
Alkaline Phosphatase: 59 U/L (ref 38–126)
Anion gap: 6 (ref 5–15)
BUN: 16 mg/dL (ref 8–23)
CO2: 30 mmol/L (ref 22–32)
Calcium: 8.9 mg/dL (ref 8.9–10.3)
Chloride: 100 mmol/L (ref 98–111)
Creatinine: 0.48 mg/dL (ref 0.44–1.00)
GFR, Estimated: >60 mL/min (ref >60)
Glucose, Bld: 87 mg/dL (ref 70–99)
Potassium: 4 mmol/L (ref 3.5–5.1)
Sodium: 136 mmol/L (ref 135–145)
Total Bilirubin: 0.4 mg/dL (ref 0.3–1.2)
Total Protein: 6.6 g/dL (ref 6.5–8.1)

## 2020-10-14 LAB — TSH: TSH: 1.242 u[IU]/mL (ref 0.350–4.500)

## 2020-10-14 MED ORDER — SODIUM CHLORIDE 0.9 % IV SOLN
Freq: Once | INTRAVENOUS | Status: AC
  Filled 2020-10-14: qty 250

## 2020-10-14 MED ORDER — SODIUM CHLORIDE 0.9 % IV SOLN
200.0000 mg | Freq: Once | INTRAVENOUS | Status: AC
  Administered 2020-10-14: 200 mg via INTRAVENOUS
  Filled 2020-10-14: qty 8

## 2020-10-14 NOTE — Progress Notes (Signed)
[facility] OFFICE PROGRESS NOTE   Diagnosis: Non-small cell lung cancer  INTERVAL HISTORY:   Brooke [name] returns as scheduled.  She completed another cycle of Pembrolizumab 09/23/2020.  No rash or diarrhea.  Reports vertigo last week, better now.  She had nausea/vomiting with the vertigo, resolved now.  She notes improvement in activity tolerance.  Stable dyspnea.  She has intermittent pain left side of her throat and wonders if this could be related to the ear.  Objective:  Vital signs in last 24 hours:  Blood pressure (!) 158/85, pulse 80, temperature 97.7 F (36.5 C), temperature source Oral, resp. rate 18, height _0  (1.575 m), weight 96 lb 3.2 oz (43.6 kg), SpO2 100 %.    HEENT: No thrush or ulcers.  Left ear with cerumen which I could not see past. Resp: Distant breath sounds.  No respiratory distress. Cardio: Regular rate and rhythm. GI: Abdomen soft and nontender.  No hepatomegaly. Vascular: No leg edema. Skin: No rash.   Lab Results:  Lab Results  Component Value Date   WBC 5.0 09/23/2020   HGB 13.2 09/23/2020   HCT 41.4 09/23/2020   MCV 91.6 09/23/2020   PLT 219 09/23/2020   NEUTROABS 3.7 09/23/2020    Imaging:  No results found.  Medications: I have reviewed the patient's current medications.  Assessment/Plan: 1.  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 PDL1 60% Left lung radiation 11/08/2016 through 11/21/2016 CT chest 12/12/2016-reexpansion of left upper lobe with a decreased left upper lobe mass, unchanged mediastinal adenopathy,  progression of a splenic metastasis/pancreatic tail/gastrohepatic ligament metastasis, right lower lobe pneumonia Cycle 1 Pembrolizumab 12/14/2016 Cycle 2 Pembrolizumab 01/03/2017 Cycle 3 Pembrolizumab 01/24/2017 Cycle 4 Pembrolizumab 02/12/2017 CT 03/05/2018-decrease in left upper lobe mass, mediastinal adenopathy, and splenic mass Cycle 5 pembrolizumab 03/07/2017 Cycle 6 Pembrolizumab 04/02/2017 Cycle 7 pembrolizumab 04/25/2017 Cycle 8 Pembrolizumab 05/14/2017 Cycle 9 Pembrolizumab 06/06/2017 CT 06/20/2017-mild decrease in left upper lobe mass, mild increase in paramediastinal left upper lobe nodule-radiation change?, decreased splenic metastasis Cycle 10 pembrolizumab 06/25/2017 Cycle 11 pembrolizumab 07/18/2017 Cycle 12 pembrolizumab 08/29/2017 Cycle 13 pembrolizumab 09/17/2017 Cycle 14 Pembrolizumab 10/10/2017 CT chest 10/24/2017- slight enlargement of small mediastinal lymph nodes, increased left upper lobe consolidation, decreased size of spleen lesion Cycle 15 pembrolizumab 10/29/2017 Cycle 16 Pembrolizumab 11/21/2017 Cycle 17 Pembrolizumab 12/11/2017 Cycle 18 pembrolizumab 01/02/2018 Cycle 19 Pembrolizumab 01/21/2018 Cycle 20 Pembrolizumab 02/13/2018 CT chest 02/27/2018- decreased left paratracheal node, progressive consolidation/fibrosis in the left upper lung, decreased size of splenic mass Cycle 21 pembrolizumab 03/04/2018 Cycle 22 Pembrolizumab 03/27/2018  Cycle 23 pembrolizumab 04/15/2018 Cycle 24 pembrolizumab 05/08/2018 Cycle 25 Pembrolizumab 05/27/2018 CT chest 06/16/2018- stable left upper lung radiation fibrosis with no evidence of local tumor recurrence.  Decreased left paratracheal adenopathy.  No new or progressive metastatic disease in the chest.  Gastrohepatic ligament lymphadenopathy stable.  Splenic metastasis decreased.  T5 vertebral compression fracture with associated patchy sclerosis and no discrete osseous lesion, new in the interval. Cycle 26 Pembrolizumab 06/19/2018 Cycle  27 pembrolizumab 07/08/2018 Cycle 28 pembrolizumab 07/31/2018 Cycle 29 pembrolizumab 08/19/2018 Cycle 30 Pembrolizumab 09/11/2018 Cycle 31 pembrolizumab 09/30/2018 Cycle 32 pembrolizumab 10/23/2018 CT chest 10/28/2018- left apical pleural-parenchymal opacity consistent with  radiation scarring has become more confluent likely representing evolutionary change.  Continued further decrease in size of index left paratracheal node and splenic metastasis.  Gastrohepatic ligament lymph node not changed. Cycle 33 Pembrolizumab 11/11/2018 Cycle 34 pembrolizumab 12/04/2018 Cycle 35 pembrolizumab 12/23/2018 CT chest 02/12/2019- posttreatment changes left upper lobe unchanged.  Continued decrease in size of splenic metastasis.  Stable appearance of left paratracheal lymph nodes.  Stable gastrohepatic adenopathy.  Incomplete visualization of retroperitoneal lymph nodes in the upper abdomen. CT chest 07/02/2019-stable post treatment related changes left lung.  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  Slight regression of enlarged likely metastatic gastrohepatic lymph node. CT chest 11/11/2019-interval enlargement of a right paratracheal lymph node measuring 14 mm, increased from 5 mm.  No new lung mass or nodularity.  New adenopathy in the upper abdomen.  2.7 cm lesion gastrohepatic ligament.  Necrotic node at the splenic hilum measures 4.7 cm.  Large node along the IVC left upper quadrant measures 2.7 cm.  Similar node left adjacent the aorta measures 1.8 cm.  Enhancing lesion in the spleen is unchanged.  Several low-density lesions in the liver are unchanged. Pembrolizumab resumed on a 3-week schedule 12/03/2019 CT chest 02/22/2020-interval resolution of previously demonstrated left paratracheal and upper abdominal adenopathy.  Stable treated metastasis within the spleen.  Stable radiation changes in the left lung with left hilar distortion.  No evidence of local recurrence or progressive metastatic disease. Continuation of  pembrolizumab every 3 weeks 02/26/2020 CT chest 06/28/2020-no change in left upper lobe consolidation, unchanged spleen metastasis, no evidence of progressive disease Pembrolizumab continued every 3 weeks 07/01/2020     2.  05/29/2019 laryngoscopy-exophytic appearing mass mid right vocal cord.   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  Tumor cells negative for p16, CMV and HSV 2.  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 Radiation 07/29/2019-08/25/2019   3. History of acute respiratory failure secondary to #1   4. Oxygen dependent COPD   5. History of a NSTEMI 2015   6. Right lung pneumonia on chest CT 12/12/2016-treated with Levaquin   7.  Grade 2 rash possibly related to Pembrolizumab.  Treatment held 08/06/2017.  Improved 08/29/2017, treatment resumed, rash has resolved   8.  Vertigo January 2022-improved with course of Augmentin for ear/sinus infection  Disposition: Brooke [name] appears stable.  There is no clinical evidence of disease progression.  Plan to proceed with Pembrolizumab today as scheduled.  Restaging chest CT prior to next office visit in 3 weeks.  She is having intermittent pain at the left side of her throat.  I encouraged her to contact Dr. Blenda Nicely to discuss.  She will return for lab, follow-up, Pembrolizumab in 3 weeks, restaging chest CT a few days prior.  She will contact the office in the interim with any problems.   [name] ANP/GNP-BC   10/14/2020  9:51 AM

## 2020-10-14 NOTE — Patient Instructions (Signed)
[name]  Discharge Instructions: Thank you for choosing [facility] to provide your oncology and hematology care.   If you have a lab appointment with the [facility], please go directly to the [facility] and check in at the registration area.   Wear comfortable clothing and clothing appropriate for easy access to any Portacath or PICC line.   We strive to give you quality time with your provider. You may need to reschedule your appointment if you arrive late (15 or more minutes).  Arriving late affects you and other patients whose appointments are after yours.  Also, if you miss three or more appointments without notifying the office, you may be dismissed from the clinic at the provider's discretion.      For prescription refill requests, have your pharmacy contact our office and allow 72 hours for refills to be completed.    Today you received the following chemotherapy and/or immunotherapy agents Keytruda      To help prevent nausea and vomiting after your treatment, we encourage you to take your nausea medication as directed.  BELOW ARE SYMPTOMS THAT SHOULD BE REPORTED IMMEDIATELY: *FEVER GREATER THAN 100.4 F (38 C) OR HIGHER *CHILLS OR SWEATING *NAUSEA AND VOMITING THAT IS NOT CONTROLLED WITH YOUR NAUSEA MEDICATION *UNUSUAL SHORTNESS OF BREATH *UNUSUAL BRUISING OR BLEEDING *URINARY PROBLEMS (pain or burning when urinating, or frequent urination) *BOWEL PROBLEMS (unusual diarrhea, constipation, pain near the anus) TENDERNESS IN MOUTH AND THROAT WITH OR WITHOUT PRESENCE OF ULCERS (sore throat, sores in mouth, or a toothache) UNUSUAL RASH, SWELLING OR PAIN  UNUSUAL VAGINAL DISCHARGE OR ITCHING   Items with * indicate a potential emergency and should be followed up as soon as possible or go to the Emergency Department if any problems should occur.  Please show the CHEMOTHERAPY ALERT CARD or IMMUNOTHERAPY ALERT CARD at check-in to the  Emergency Department and triage nurse.  Should you have questions after your visit or need to cancel or reschedule your appointment, please contact [facility]  Dept: [phone]  and follow the prompts.  Office hours are 8:00 a.m. to 4:30 p.m. Monday - Friday. Please note that voicemails left after 4:00 p.m. may not be returned until the following business day.  We are closed weekends and major holidays. You have access to a nurse at all times for urgent questions. Please call the main number to the clinic Dept: [phone] and follow the prompts.   For any non-urgent questions, you may also contact your provider using MyChart. We now offer e-Visits for anyone 67 and older to request care online for non-urgent symptoms. For details visit [email or web address].   Also download the MyChart app! Go to the app store, search "MyChart", open the app, select [facility], and log in with your MyChart username and password.  Due to Covid, a mask is required upon entering the hospital/clinic. If you do not have a mask, one will be given to you upon arrival. For doctor visits, patients may have 1 support person aged 52 or older with them. For treatment visits, patients cannot have anyone with them due to current Covid guidelines and our immunocompromised population.

## 2020-10-21 ENCOUNTER — Telehealth (HOSPITAL_COMMUNITY): Payer: Self-pay

## 2020-10-21 NOTE — Telephone Encounter (Signed)
Called and spoke with pt in regards to [place], pt stated she is not interested at this time due to the weather.   Closed referral

## 2020-10-28 ENCOUNTER — Other Ambulatory Visit: Payer: Self-pay

## 2020-10-28 ENCOUNTER — Ambulatory Visit
Admission: RE | Admit: 2020-10-28 | Discharge: 2020-10-28 | Disposition: A | Discharge status: Home / Self Care | Payer: Medicare Other | Source: Ambulatory Visit | Attending: Radiation Oncology | Admitting: Radiation Oncology

## 2020-10-28 VITALS — BP 156/78 | HR 72 | Temp 97.4°F | Resp 20 | Ht 62.0 in | Wt 96.5 lb

## 2020-10-28 DIAGNOSIS — Z85118 Personal history of other malignant neoplasm of bronchus and lung: Secondary | ICD-10-CM | POA: Diagnosis not present

## 2020-10-28 DIAGNOSIS — I890 Lymphedema, not elsewhere classified: Secondary | ICD-10-CM | POA: Diagnosis not present

## 2020-10-28 DIAGNOSIS — Z85819 Personal history of malignant neoplasm of unspecified site of lip, oral cavity, and pharynx: Secondary | ICD-10-CM | POA: Diagnosis not present

## 2020-10-28 DIAGNOSIS — R42 Dizziness and giddiness: Secondary | ICD-10-CM | POA: Insufficient documentation

## 2020-10-28 DIAGNOSIS — Z79899 Other long term (current) drug therapy: Secondary | ICD-10-CM | POA: Insufficient documentation

## 2020-10-28 DIAGNOSIS — H9202 Otalgia, left ear: Secondary | ICD-10-CM | POA: Insufficient documentation

## 2020-10-28 DIAGNOSIS — C3412 Malignant neoplasm of upper lobe, left bronchus or lung: Secondary | ICD-10-CM

## 2020-10-28 DIAGNOSIS — C320 Malignant neoplasm of glottis: Secondary | ICD-10-CM

## 2020-10-28 DIAGNOSIS — Z923 Personal history of irradiation: Secondary | ICD-10-CM | POA: Insufficient documentation

## 2020-10-28 MED ORDER — OXYMETAZOLINE HCL 0.05 % NA SOLN: 1.0000 | Freq: Two times a day (BID) | NASAL | Status: DC

## 2020-10-28 MED ORDER — OXYMETAZOLINE HCL 0.05 % NA SOLN
1.0000 | Freq: Once | NASAL | Status: AC
  Administered 2020-10-28: 1 via NASAL
  Filled 2020-10-28: qty 30

## 2020-10-28 NOTE — Progress Notes (Signed)
Ms. Axford presents for follow-up after completing radiation to the larynx on 08/25/2019 (also completed radiation to her left lung on 11/21/2016)  Pain issues, if any: Patient denies Using a feeding tube?: N/A Weight changes, if any: reports a healthy appetite Wt Readings from Last 3 Encounters:  10/28/20 96 lb 8 oz (43.8 kg)  10/14/20 96 lb 3.2 oz (43.6 kg)  09/23/20 95 lb 12.8 oz (43.5 kg)   Swallowing issues, if any: Patient denies. Reports she can eat/drink a wide variety. Does state she occasionally has left ear pain if she tries to eat something hard or very acidic.  Smoking or chewing tobacco? None Using fluoride trays daily? N/A--reports she had her routine dental visit last week (has teeth cleaned 4x year).  Last ENT visit was on: 08/02/2020 Saw Dr. Lind Guest  "--Procedure: Transnasal fiberoptic laryngoscopy --Findings: The nasal cavity and nasopharynx are unremarkable. There are no suspicious findings in the nasopharynx or Fossa of Rosenmller. The tongue base, pharyngeal walls, piriform sinuses, vallecula, epiglottis and postcricoid region are normal in appearance. The visualized portion of the subglottis and proximal trachea is widely patent. The right cord is mobile. 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 The left true cord and arytenoid do demonstrate some hypomobility. There is no evidence of any disease today. There are no lesions or significant inflammation. There is no glottal insufficiency.There is no pooling of secretions or aspiration. 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 The left vocal fold did demonstrate hypomobility. I will plan on seeing her back every 6 months, as I will alternate with Dr.Squire (Radiation Oncology). --Plan:  1. RTC 6 months for recheck. 2. Agree with PT referral, return if vertigo not  improving with 6 weeks of PT. Would obtain MRI brain if persistent vertigo 3. Augmentin 875mg  BID x 14 days 4. Daily sinus irrigation"  Cough/SOB/O2 use: Reports occasional cough (states if it's productive, sputum is small and frothy). Feels more wheezy this morning. Recently changed daily inhalers, and reports new formula seems to be providing more relief. Continues to wear supplemental oxygen (1.5-2 L-[place])  Other notable issues, if any: Continues to deal with vertigo and occasional pain to her inner left ear (states pain usually resolves quickly on its own). Continues to deal with vocal hoarseness (unable to project voice) and gets winded fairly quickly while speaking.   Vitals:   10/28/20 0953  Pulse: 75  Resp: 20  Temp: (!) 97.4 F (36.3 C)  SpO2: 100%

## 2020-10-29 ENCOUNTER — Encounter: Payer: Self-pay | Admitting: Radiation Oncology

## 2020-10-29 NOTE — Progress Notes (Signed)
Radiation Oncology         [phone] ________________________________  Name: Nashalie Sallis MRN: [id number]  Date: 10/28/2020  DOB: [birth year]  Follow-Up Visit Note  CC: [name], MD  Helayne Seminole, MD  Diagnosis and Prior Radiotherapy:       ICD-10-CM   1. 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 Coiro presents for follow-up after completing radiation to the larynx on 08/25/2019 (also completed radiation to her left lung on 11/21/2016)  Pain issues, if any: Patient denies Using a feeding tube?: N/A Weight changes, if any: reports a healthy appetite Wt Readings from Last 3 Encounters:  10/28/20 96 lb 8 oz (43.8 kg)  10/14/20 96 lb 3.2 oz (43.6 kg)  09/23/20 95 lb 12.8 oz (43.5 kg)   Swallowing issues, if any: Patient denies. Reports she can eat/drink a wide variety. Does state she occasionally has left ear pain if she tries to eat something hard or very acidic.  Smoking or chewing tobacco? None Using fluoride trays daily? N/A--reports she had her routine dental visit last week (has teeth cleaned 4x year).  Last ENT visit was on: 08/02/2020 Saw Dr. Lind Guest  "--Procedure: Transnasal fiberoptic laryngoscopy --Findings: The nasal cavity and nasopharynx are unremarkable. There are no suspicious findings in the nasopharynx or Fossa of Rosenmller. The tongue base, pharyngeal walls, piriform sinuses, vallecula,  epiglottis and postcricoid region are normal in appearance. The visualized portion of the subglottis and proximal trachea is widely patent. The right cord is mobile. 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 The left true cord and arytenoid do demonstrate some hypomobility. There is no evidence of any disease today. There are no lesions or significant inflammation. There is no glottal insufficiency.There is no pooling of secretions or aspiration. 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 The left vocal fold did demonstrate hypomobility. I will plan on seeing her back every 6 months, as I will alternate with Dr.Orean Giarratano (Radiation Oncology). --Plan:  1. RTC 6 months for recheck. 2. Agree with PT referral, return if vertigo not improving with 6 weeks of PT. Would obtain MRI brain if persistent vertigo 3. Augmentin 875mg  BID x 14 days 4. Daily sinus irrigation"  Cough/SOB/O2 use: Reports occasional cough (states if it's productive, sputum is small and frothy). Feels more wheezy this morning. Recently changed daily inhalers, and reports new formula seems to be providing more relief. Continues to wear supplemental oxygen (1.5-2 L-[place])  Other notable issues, if any: Continues to deal with vertigo and occasional pain to her inner left ear (states pain usually resolves quickly on its own). Continues to deal with vocal hoarseness (unable to project voice) and gets winded fairly quickly while speaking.   Vitals:   10/28/20 0953 10/28/20 1130  BP: (!) 165/89 (!) 156/78  Pulse:  75 72  Resp: 20   Temp: (!) 97.4 F (36.3 C)   SpO2: 100%              ALLERGIES:  is allergic to afrin [oxymetazoline], codeine, imdur [isosorbide dinitrate], prednisone, and pulmicort [budesonide].  Meds: Current Outpatient Medications  Medication Sig Dispense Refill    acetaminophen (TYLENOL) 325 MG tablet Per bottle as needed     albuterol (PROVENTIL HFA;VENTOLIN HFA) 108 (90 BASE) MCG/ACT inhaler Inhale 2 puffs into the lungs every 4 (four) hours as needed for wheezing or shortness of breath. **PLAN B**     ALPRAZolam (XANAX) 0.25 MG tablet 1/2-1 twice daily as needed  1   amLODipine (NORVASC) 2.5 MG tablet Take by mouth based on top number of BP. 1 tab (2.5mg ) if BP 140-159, 2 tabs (5mg ) for 160-179, 3 tabs (7.5 mg) for BP >180 180 tablet 3   atorvastatin (LIPITOR) 20 MG tablet TAKE 1 TABLET(20 MG) BY MOUTH DAILY AT 6 PM 90 tablet 1   Budeson-Glycopyrrol-Formoterol (BREZTRI AEROSPHERE) 160-9-4.8 MCG/ACT AERO Inhale 2 puffs into the lungs every 12 (twelve) hours. 10.7 g 5   guaiFENesin (MUCINEX) 600 MG 12 hr tablet Take 600 mg by mouth 2 (two) times daily as needed for cough or to loosen phlegm.     ibuprofen (ADVIL,MOTRIN) 600 MG tablet Take 600 mg by mouth every 6 (six) hours as needed.     ipratropium-albuterol (DUONEB) 0.5-2.5 (3) MG/3ML SOLN Take 3 mLs by nebulization every 4 (four) hours as needed (wheezing/shortness of breath). **PLAN C**     nitroGLYCERIN (NITROSTAT) 0.4 MG SL tablet ONE TABLET UNDER TONGUE AS NEEDED FOR CHEST PAIN EVERY 5 MINUTES FOR 3 DOSES 25 tablet 3   NON FORMULARY [facility]  Onychomycosis Nail Lacquer -  Fluconazole 2%, Terbinafine 1% DMSO/undecylenic acid 25% Apply to affected nail once daily Qty. 120 gm 3 refills     OXYGEN 2lpm 24/7     Probiotic Product (PROBIOTIC ADVANCED PO) Take 1 tablet by mouth daily.     Simethicone 180 MG CAPS Use as needed     Current Facility-Administered Medications  Medication Dose Route Frequency Provider Last Rate Last Admin   methylPREDNISolone acetate (DEPO-MEDROL) injection 120 mg  120 mg Intramuscular Once Tanda Rockers, MD        Physical Findings: The patient is in no acute distress. Patient is alert and oriented. Wt Readings from Last 3 Encounters:  10/28/20 96 lb 8 oz  (43.8 kg)  10/14/20 96 lb 3.2 oz (43.6 kg)  09/23/20 95 lb 12.8 oz (43.5 kg)    height is 5\' 2"  (1.575 m) and weight is 96 lb 8 oz (43.8 kg). Her temporal temperature is 97.4 F (36.3 C) (abnormal). Her blood pressure is 156/78 (abnormal) and her pulse is 72. Her respiration is 20 and oxygen saturation is 100%. .  General: Alert and oriented, in no acute distress; hoarse HEENT: Head is normocephalic. Extraocular movements are intact. Left Tympanic membrane WNL. Oropharynx / mouth are notable for no lesions Neck is notable for lymphedema in anterior neck, no masses; skin intact Skin: Skin in treatment fields shows satisfactory healing  Psychiatric: Judgment and insight are intact. Affect is appropriate.  PROCEDURE NOTE:  PROCEDURE NOTE: After obtaining consent and spraying nasal cavity with topical oxymetazoline, the flexible endoscope was introduced and passed through the nasal cavity.  The nasopharynx, oropharynx, hypopharynx, and larynx  were then examined. No lesions appreciated in the mucosal axis.  The true cords  were symmetrically mobile.   Note- patient felt palpitations and anxiety after oxymetazoline was sprayed. Pulse and heart rhythm remained WNL as did pulse ox. This drug was added to her allergy list and she was observed before we released her from clinic in stable condition.  Lab Findings: Lab Results  Component Value Date   WBC 5.0 09/23/2020   HGB 13.2 09/23/2020   HCT 41.4 09/23/2020   MCV 91.6 09/23/2020   PLT 219 09/23/2020    Lab Results  Component Value Date   TSH 1.242 10/14/2020    Radiographic Findings: No results found.   Impression/Plan:    1) Head and Neck Cancer Status: NED  2) Nutritional Status: stable  Wt Readings from Last 3 Encounters:  10/28/20 96 lb 8 oz (43.8 kg)  10/14/20 96 lb 3.2 oz (43.6 kg)  09/23/20 95 lb 12.8 oz (43.5 kg)   PEG tube: None  3) Risk Factors: The patient has been educated about risk factors including alcohol and  tobacco abuse; they understand that avoidance of alcohol and tobacco is important to prevent recurrences as well as other cancers  4) Swallowing: Continue swallowing therapy  5)  Thyroid function: Check annually - WNL Lab Results  Component Value Date   TSH 1.242 10/14/2020    6)  Vertigo - After seeing patient, I contacted our CNS navigator to 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  On date of service, in total, I spent 30 minutes on this encounter.  Patient seen in person. _____________________________________   Eppie Gibson, MD

## 2020-10-30 ENCOUNTER — Other Ambulatory Visit: Payer: Self-pay | Admitting: Oncology

## 2020-10-31 ENCOUNTER — Other Ambulatory Visit: Payer: Self-pay | Admitting: Radiation Therapy

## 2020-10-31 DIAGNOSIS — Z85118 Personal history of other malignant neoplasm of bronchus and lung: Secondary | ICD-10-CM

## 2020-11-02 ENCOUNTER — Ambulatory Visit (HOSPITAL_BASED_OUTPATIENT_CLINIC_OR_DEPARTMENT_OTHER)
Admission: RE | Admit: 2020-11-02 | Discharge: 2020-11-02 | Disposition: A | Discharge status: Home / Self Care | Payer: Medicare Other | Source: Ambulatory Visit | Attending: Nurse Practitioner | Admitting: Nurse Practitioner

## 2020-11-02 ENCOUNTER — Other Ambulatory Visit: Payer: Self-pay

## 2020-11-02 DIAGNOSIS — C3412 Malignant neoplasm of upper lobe, left bronchus or lung: Secondary | ICD-10-CM | POA: Insufficient documentation

## 2020-11-02 MED ORDER — IOHEXOL 300 MG/ML  SOLN
60.0000 mL | Freq: Once | INTRAMUSCULAR | Status: AC | PRN
  Administered 2020-11-02: 60 mL via INTRAVENOUS

## 2020-11-04 ENCOUNTER — Inpatient Hospital Stay: Payer: Medicare Other | Attending: Oncology

## 2020-11-04 ENCOUNTER — Other Ambulatory Visit: Payer: Self-pay

## 2020-11-04 ENCOUNTER — Inpatient Hospital Stay (HOSPITAL_BASED_OUTPATIENT_CLINIC_OR_DEPARTMENT_OTHER): Payer: Medicare Other | Admitting: Oncology

## 2020-11-04 ENCOUNTER — Inpatient Hospital Stay: Payer: Medicare Other

## 2020-11-04 VITALS — BP 139/83 | HR 79 | Temp 97.8°F | Resp 18 | Ht 62.0 in | Wt 98.0 lb

## 2020-11-04 DIAGNOSIS — I700 Atherosclerosis of aorta: Secondary | ICD-10-CM | POA: Insufficient documentation

## 2020-11-04 DIAGNOSIS — C3412 Malignant neoplasm of upper lobe, left bronchus or lung: Secondary | ICD-10-CM

## 2020-11-04 DIAGNOSIS — J439 Emphysema, unspecified: Secondary | ICD-10-CM | POA: Insufficient documentation

## 2020-11-04 DIAGNOSIS — J440 Chronic obstructive pulmonary disease with acute lower respiratory infection: Secondary | ICD-10-CM | POA: Diagnosis not present

## 2020-11-04 DIAGNOSIS — Z5112 Encounter for antineoplastic immunotherapy: Secondary | ICD-10-CM | POA: Diagnosis present

## 2020-11-04 DIAGNOSIS — I252 Old myocardial infarction: Secondary | ICD-10-CM | POA: Diagnosis not present

## 2020-11-04 DIAGNOSIS — Z9981 Dependence on supplemental oxygen: Secondary | ICD-10-CM | POA: Insufficient documentation

## 2020-11-04 DIAGNOSIS — I2510 Atherosclerotic heart disease of native coronary artery without angina pectoris: Secondary | ICD-10-CM | POA: Insufficient documentation

## 2020-11-04 DIAGNOSIS — Z79899 Other long term (current) drug therapy: Secondary | ICD-10-CM | POA: Diagnosis not present

## 2020-11-04 LAB — CMP (CANCER CENTER ONLY)
ALT: 15 U/L (ref 0–44)
AST: 15 U/L (ref 15–41)
Albumin: 4 g/dL (ref 3.5–5.0)
Alkaline Phosphatase: 55 U/L (ref 38–126)
Anion gap: 7 (ref 5–15)
BUN: 17 mg/dL (ref 8–23)
CO2: 28 mmol/L (ref 22–32)
Calcium: 9.2 mg/dL (ref 8.9–10.3)
Chloride: 101 mmol/L (ref 98–111)
Creatinine: 0.52 mg/dL (ref 0.44–1.00)
GFR, Estimated: >60 mL/min (ref >60)
Glucose, Bld: 96 mg/dL (ref 70–99)
Potassium: 4.1 mmol/L (ref 3.5–5.1)
Sodium: 136 mmol/L (ref 135–145)
Total Bilirubin: 0.5 mg/dL (ref 0.3–1.2)
Total Protein: 6.8 g/dL (ref 6.5–8.1)

## 2020-11-04 MED ORDER — SODIUM CHLORIDE 0.9 % IV SOLN
200.0000 mg | Freq: Once | INTRAVENOUS | Status: AC
  Administered 2020-11-04: 200 mg via INTRAVENOUS
  Filled 2020-11-04: qty 8

## 2020-11-04 MED ORDER — SODIUM CHLORIDE 0.9 % IV SOLN
Freq: Once | INTRAVENOUS | Status: AC
Start: 2020-11-04 — End: 2020-11-04
  Filled 2020-11-04: qty 250

## 2020-11-04 NOTE — Patient Instructions (Signed)
[facility]  Discharge Instructions: Thank you for choosing [facility] to provide your oncology and hematology care.   If you have a lab appointment with the [facility], please go directly to the [facility] and check in at the registration area.   Wear comfortable clothing and clothing appropriate for easy access to any Portacath or PICC line.   We strive to give you quality time with your provider. You may need to reschedule your appointment if you arrive late (15 or more minutes).  Arriving late affects you and other patients whose appointments are after yours.  Also, if you miss three or more appointments without notifying the office, you may be dismissed from the clinic at the provider's discretion.      For prescription refill requests, have your pharmacy contact our office and allow 72 hours for refills to be completed.    Today you received the following chemotherapy and/or immunotherapy agents pembrolizumab   To help prevent nausea and vomiting after your treatment, we encourage you to take your nausea medication as directed.  BELOW ARE SYMPTOMS THAT SHOULD BE REPORTED IMMEDIATELY: *FEVER GREATER THAN 100.4 F (38 C) OR HIGHER *CHILLS OR SWEATING *NAUSEA AND VOMITING THAT IS NOT CONTROLLED WITH YOUR NAUSEA MEDICATION *UNUSUAL SHORTNESS OF BREATH *UNUSUAL BRUISING OR BLEEDING *URINARY PROBLEMS (pain or burning when urinating, or frequent urination) *BOWEL PROBLEMS (unusual diarrhea, constipation, pain near the anus) TENDERNESS IN MOUTH AND THROAT WITH OR WITHOUT PRESENCE OF ULCERS (sore throat, sores in mouth, or a toothache) UNUSUAL RASH, SWELLING OR PAIN  UNUSUAL VAGINAL DISCHARGE OR ITCHING   Items with * indicate a potential emergency and should be followed up as soon as possible or go to the Emergency Department if any problems should occur.  Please show the CHEMOTHERAPY ALERT CARD or IMMUNOTHERAPY ALERT CARD at check-in to the  Emergency Department and triage nurse.  Should you have questions after your visit or need to cancel or reschedule your appointment, please contact [facility]  Dept: [phone]  and follow the prompts.  Office hours are 8:00 a.m. to 4:30 p.m. Monday - Friday. Please note that voicemails left after 4:00 p.m. may not be returned until the following business day.  We are closed weekends and major holidays. You have access to a nurse at all times for urgent questions. Please call the main number to the clinic Dept: [phone] and follow the prompts.   For any non-urgent questions, you may also contact your provider using MyChart. We now offer e-Visits for anyone 63 and older to request care online for non-urgent symptoms. For details visit [email or web address].   Also download the MyChart app! Go to the app store, search "MyChart", open the app, select [facility], and log in with your MyChart username and password.  Due to Covid, a mask is required upon entering the hospital/clinic. If you do not have a mask, one will be given to you upon arrival. For doctor visits, patients may have 1 support person aged 19 or older with them. For treatment visits, patients cannot have anyone with them due to current Covid guidelines and our immunocompromised population.   Pembrolizumab injection What is this medication? PEMBROLIZUMAB (pem broe liz ue mab) is a monoclonal antibody. It is used totreat certain types of cancer. This medicine may be used for other purposes; ask your health care provider orpharmacist if you have questions. COMMON BRAND NAME(S): Keytruda What should I tell my care  team before I take this medication? They need to know if you have any of these conditions: autoimmune diseases like Crohn's disease, ulcerative colitis, or lupus have had or planning to have an allogeneic stem cell transplant (uses someone else's stem cells) history of organ  transplant history of chest radiation nervous system problems like myasthenia gravis or Guillain-Barre syndrome an unusual or allergic reaction to pembrolizumab, other medicines, foods, dyes, or preservatives pregnant or trying to get pregnant breast-feeding How should I use this medication? This medicine is for infusion into a vein. It is given by a health careprofessional in a hospital or clinic setting. A special MedGuide will be given to you before each treatment. Be sure to readthis information carefully each time. Talk to your pediatrician regarding the use of this medicine in children. While this drug may be prescribed for children as young as 6 months for selectedconditions, precautions do apply. Overdosage: If you think you have taken too much of this medicine contact apoison control center or emergency room at once. NOTE: This medicine is only for you. Do not share this medicine with others. What if I miss a dose? It is important not to miss your dose. Call your doctor or health careprofessional if you are unable to keep an appointment. What may interact with this medication? Interactions have not been studied. This list may not describe all possible interactions. Give your health care provider a list of all the medicines, herbs, non-prescription drugs, or dietary supplements you use. Also tell them if you smoke, drink alcohol, or use illegaldrugs. Some items may interact with your medicine. What should I watch for while using this medication? Your condition will be monitored carefully while you are receiving thismedicine. You may need blood work done while you are taking this medicine. Do not become pregnant while taking this medicine or for 4 months after stopping it. Women should inform their doctor if they wish to become pregnant or think they might be pregnant. There is a potential for serious side effects to an unborn child. Talk to your health care professional or pharmacist for  more information. Do not breast-feed an infant while taking this medicine orfor 4 months after the last dose. What side effects may I notice from receiving this medication? Side effects that you should report to your doctor or health care professionalas soon as possible: allergic reactions like skin rash, itching or hives, swelling of the face, lips, or tongue bloody or black, tarry breathing problems changes in vision chest pain chills confusion constipation cough diarrhea dizziness or feeling faint or lightheaded fast or irregular heartbeat fever flushing joint pain low blood counts - this medicine may decrease the number of white blood cells, red blood cells and platelets. You may be at increased risk for infections and bleeding. muscle pain muscle weakness pain, tingling, numbness in the hands or feet persistent headache redness, blistering, peeling or loosening of the skin, including inside the mouth signs and symptoms of high blood sugar such as dizziness; dry mouth; dry skin; fruity breath; nausea; stomach pain; increased hunger or thirst; increased urination signs and symptoms of kidney injury like trouble passing urine or change in the amount of urine signs and symptoms of liver injury like dark urine, light-colored stools, loss of appetite, nausea, right upper belly pain, yellowing of the eyes or skin sweating swollen lymph nodes weight loss Side effects that usually do not require medical attention (report to yourdoctor or health care professional if they continue or are bothersome):  decreased appetite hair loss tiredness This list may not describe all possible side effects. Call your doctor for medical advice about side effects. You may report side effects to FDA at1-800-FDA-1088. Where should I keep my medication? This drug is given in a hospital or clinic and will not be stored at home. NOTE: This sheet is a summary. It may not cover all possible information. If you  have questions about this medicine, talk to your doctor, pharmacist, orhealth care provider.  2022 Elsevier/Gold Standard (2019-03-25 21:44:53)

## 2020-11-04 NOTE — Progress Notes (Signed)
[facility] OFFICE PROGRESS NOTE   Diagnosis: Non-small cell lung cancer  INTERVAL HISTORY:   Ms. Shasteen returns as scheduled.  She completed another cycle of pembrolizumab on 10/14/2020.  She thinks she may have a mild rash at the anterior chest.  Her voice has improved.  She reports an episode of vertigo in January and again on June 15.  She was seen by vestibular physical therapy and the vertigo resolved.  Good appetite.  Objective:  Vital signs in last 24 hours:  Blood pressure 139/83, pulse 79, temperature 97.8 F (36.6 C), temperature source Oral, resp. rate 18, height 5' 2" (1.575 m), weight 98 lb (44.5 kg), SpO2 99 %.   HEENT: Mild radiation edema changes at the anterior neck Lymphatics: No cervical or supraclavicular nodes Resp: Bronchial sounds at the left posterior chest, no respiratory distress Cardio: Regular rate and rhythm GI: No hepatosplenomegaly Vascular: No leg edema  Skin: No rash  Lab Results:  Lab Results  Component Value Date   WBC 5.0 09/23/2020   HGB 13.2 09/23/2020   HCT 41.4 09/23/2020   MCV 91.6 09/23/2020   PLT 219 09/23/2020   NEUTROABS 3.7 09/23/2020    CMP  Lab Results  Component Value Date   NA 136 11/04/2020   K 4.1 11/04/2020   CL 101 11/04/2020   CO2 28 11/04/2020   GLUCOSE 96 11/04/2020   BUN 17 11/04/2020   CREATININE 0.52 11/04/2020   CALCIUM 9.2 11/04/2020   PROT 6.8 11/04/2020   ALBUMIN 4.0 11/04/2020   AST 15 11/04/2020   ALT 15 11/04/2020   ALKPHOS 55 11/04/2020   BILITOT 0.5 11/04/2020   GFRNONAA >60 11/04/2020   GFRAA >60 02/05/2020    No results found for: CEA1  Lab Results  Component Value Date   INR 0.99 11/01/2016    Imaging:  CT Chest W Contrast  Result Date: 11/02/2020 CLINICAL DATA:  Non-small cell lung cancer, metastatic in a 75 year old female. Follow-up evaluation. EXAM: CT CHEST WITH CONTRAST TECHNIQUE: Multidetector CT imaging of the chest was performed during intravenous  contrast administration. CONTRAST:  67m OMNIPAQUE IOHEXOL 300 MG/ML  SOLN COMPARISON:  February of 2022. FINDINGS: Cardiovascular: Calcified atheromatous plaque in the nonaneurysmal thoracic aorta. Post treatment changes in the LEFT hilum distort LEFT hilar structures as before. Cardiac size is stable without substantial pericardial effusion. Three-vessel coronary artery calcifications. Mediastinum/Nodes: Shift of mediastinal structures in the LEFT chest. No signs of adenopathy in the chest with evidence of cystic is a shin about the superior LEFT mediastinum indicative of post treatment changes. Soft tissue density surrounding areas of bronchiectatic changes in the LEFT upper lobe are unchanged. Lungs/Pleura: Stable consolidation and bronchiectatic changes with scarring about the LEFT hilum unchanged dating back to at least July of 2021. Signs of marked pulmonary emphysema worse at the RIGHT lung apex. Hyperexpansion of the LEFT lower lobe in the setting of LEFT upper lobe volume loss. Airways are patent aside from LEFT upper lobe airways. Upper Abdomen: Stable low-density lesions in the liver compatible with cysts. Splenic lesion measuring 2.3 x 2.1 cm decreased in size compared to previous imaging dating back to July 2021 where it measured 2.9 cm. No acute findings related to pancreas, visualized portions or visualized portions of adrenal glands and gastrointestinal tract. No upper abdominal lymphadenopathy is visible on the current exam Musculoskeletal: No acute musculoskeletal findings. Spinal degenerative changes with no change in the appearance of T5 compression fracture. IMPRESSION: 1. Stable post treatment changes about  the LEFT hilum and in the LEFT upper lobe. No new or progressive findings. 2. Treated lesion in the spleen decreased in size compared to more remote studies and the most recent comparison. 3. Three-vessel coronary artery calcifications. 4. Emphysema and aortic atherosclerosis. Aortic  Atherosclerosis (ICD10-I70.0) and Emphysema (ICD10-J43.9). Electronically Signed   By: Zetta Bills M.D.   On: 11/02/2020 15:36    Medications: I have reviewed the patient's current medications.   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% Left lung radiation 11/08/2016 through 11/21/2016 CT chest 12/12/2016-reexpansion of left upper lobe with a decreased left upper lobe mass, unchanged mediastinal adenopathy, progression of a splenic metastasis/pancreatic tail/gastrohepatic ligament metastasis, right lower lobe pneumonia Cycle 1 Pembrolizumab 12/14/2016 Cycle 2 Pembrolizumab 01/03/2017 Cycle 3 Pembrolizumab 01/24/2017 Cycle 4 Pembrolizumab 02/12/2017 CT 03/05/2018-decrease in left upper lobe mass, mediastinal adenopathy, and splenic mass Cycle 5 pembrolizumab 03/07/2017 Cycle 6 Pembrolizumab 04/02/2017 Cycle 7 pembrolizumab 04/25/2017 Cycle 8 Pembrolizumab 05/14/2017 Cycle 9 Pembrolizumab 06/06/2017 CT 06/20/2017-mild decrease in left upper lobe mass, mild increase in paramediastinal left upper lobe nodule-radiation change?, decreased splenic metastasis Cycle 10 pembrolizumab 06/25/2017 Cycle 11 pembrolizumab 07/18/2017 Cycle 12 pembrolizumab 08/29/2017 Cycle 13 pembrolizumab 09/17/2017 Cycle 14 Pembrolizumab 10/10/2017 CT chest 10/24/2017- slight enlargement of small mediastinal lymph nodes, increased left upper lobe consolidation, decreased size of spleen lesion Cycle 15 pembrolizumab 10/29/2017 Cycle 16 Pembrolizumab 11/21/2017 Cycle 17 Pembrolizumab 12/11/2017 Cycle 18  pembrolizumab 01/02/2018 Cycle 19 Pembrolizumab 01/21/2018 Cycle 20 Pembrolizumab 02/13/2018 CT chest 02/27/2018- decreased left paratracheal node, progressive consolidation/fibrosis in the left upper lung, decreased size of splenic mass Cycle 21 pembrolizumab 03/04/2018 Cycle 22 Pembrolizumab 03/27/2018  Cycle 23 pembrolizumab 04/15/2018 Cycle 24 pembrolizumab 05/08/2018 Cycle 25 Pembrolizumab 05/27/2018 CT chest 06/16/2018- stable left upper lung radiation fibrosis with no evidence of local tumor recurrence.  Decreased left paratracheal adenopathy.  No new or progressive metastatic disease in the chest.  Gastrohepatic ligament lymphadenopathy stable.  Splenic metastasis decreased.  T5 vertebral compression fracture with associated patchy sclerosis and no discrete osseous lesion, new in the interval. Cycle 26 Pembrolizumab 06/19/2018 Cycle 27 pembrolizumab 07/08/2018 Cycle 28 pembrolizumab 07/31/2018 Cycle 29 pembrolizumab 08/19/2018 Cycle 30 Pembrolizumab 09/11/2018 Cycle 31 pembrolizumab 09/30/2018 Cycle 32 pembrolizumab 10/23/2018 CT chest 10/28/2018- left apical pleural-parenchymal opacity consistent with radiation scarring has become more confluent likely representing evolutionary change.  Continued further decrease in size of index left paratracheal node and splenic metastasis.  Gastrohepatic ligament lymph node not changed. Cycle 33 Pembrolizumab 11/11/2018 Cycle 34 pembrolizumab 12/04/2018 Cycle 35 pembrolizumab 12/23/2018 CT chest 02/12/2019- posttreatment changes left upper lobe unchanged.  Continued decrease in size of splenic metastasis.  Stable appearance of left paratracheal lymph nodes.  Stable gastrohepatic adenopathy.  Incomplete visualization of retroperitoneal lymph nodes in the upper abdomen. CT chest 07/02/2019-stable post treatment related changes left lung.  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  Slight regression of enlarged likely metastatic gastrohepatic lymph node. CT chest  11/11/2019-interval enlargement of a right paratracheal lymph node measuring 14 mm, increased from 5 mm.  No new lung mass or nodularity.  New adenopathy in the upper abdomen.  2.7 cm lesion gastrohepatic ligament.  Necrotic node at the splenic hilum measures 4.7 cm.  Large node along the IVC left upper quadrant measures 2.7 cm.  Similar node left adjacent the aorta measures 1.8 cm.  Enhancing lesion in the spleen is unchanged.  Several low-density lesions in the liver are unchanged. Pembrolizumab resumed on a 3-week schedule 12/03/2019 CT chest 02/22/2020-interval resolution of previously demonstrated left paratracheal and upper abdominal adenopathy.  Stable treated metastasis within the spleen.  Stable radiation changes in the left lung with left hilar distortion.  No evidence of local recurrence or progressive metastatic disease. Continuation of pembrolizumab every 3 weeks 02/26/2020 CT chest 06/28/2020-no change in left upper lobe consolidation, unchanged spleen metastasis, no evidence of progressive disease Pembrolizumab continued every 3 weeks 07/01/2020 CT chest 11/02/2020-stable posttreatment changes at the left hilum and left upper lobe, decreased size of spleen lesion, no evidence of disease progression Pembrolizumab every 3 weeks continued     2.  05/29/2019 laryngoscopy-exophytic appearing mass mid right vocal cord.   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  Tumor cells negative for p16, CMV and HSV 2.  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 Radiation 07/29/2019-08/25/2019   3. History of acute respiratory failure secondary to #1   4. Oxygen dependent COPD   5. History of a NSTEMI 2015   6. Right lung pneumonia on chest CT 12/12/2016-treated with Levaquin   7.  Grade 2 rash possibly related to Pembrolizumab.  Treatment held 08/06/2017.  Improved 08/29/2017, treatment resumed, rash has resolved   8.  Vertigo January 2022-improved with  course of Augmentin for ear/sinus infection    Disposition: Ms. Marmol is stable.  The restaging CT reveals improvement in the left spleen lesion and no evidence of disease progression.  I reviewed the CT images with her.  We discussed changing pembrolizumab to a 6-week schedule.  She would like to stay on the 3-week schedule.  She reports a few episodes of vertigo over the past 6 months.  She does not have consistent symptoms.  She does not wish to undergo an MRI at present.  I agree.  Ms. Purewal will return for an office visit and pembrolizumab in 3 weeks.  Betsy Coder, MD  11/04/2020  9:52 AM

## 2020-11-08 ENCOUNTER — Telehealth: Payer: Self-pay | Admitting: Radiation Therapy

## 2020-11-08 NOTE — Telephone Encounter (Signed)
[name] has decided against having a brain MRI at this time. She feels that the dizziness or vertigo symptoms she has had in the past were positional in nature and that she is doing well systemically based on her other restaging scans. If she changes her mind or develops new or worsening symptoms, she will let Dr. Isidore Moos or [name] know. Per her request the scan has been cancelled.   Mont Dutton R.T.(R)(T) Radiation Special Procedures Navigator

## 2020-11-10 ENCOUNTER — Other Ambulatory Visit: Payer: Medicare Other

## 2020-11-14 ENCOUNTER — Other Ambulatory Visit: Payer: Medicare Other

## 2020-11-20 ENCOUNTER — Other Ambulatory Visit: Payer: Self-pay | Admitting: Oncology

## 2020-11-25 ENCOUNTER — Inpatient Hospital Stay: Payer: Medicare Other

## 2020-11-25 ENCOUNTER — Other Ambulatory Visit: Payer: Self-pay

## 2020-11-25 ENCOUNTER — Inpatient Hospital Stay (HOSPITAL_BASED_OUTPATIENT_CLINIC_OR_DEPARTMENT_OTHER): Payer: Medicare Other | Admitting: Oncology

## 2020-11-25 VITALS — BP 155/98 | HR 83 | Temp 98.6°F | Resp 20 | Wt 98.0 lb

## 2020-11-25 DIAGNOSIS — C3412 Malignant neoplasm of upper lobe, left bronchus or lung: Secondary | ICD-10-CM

## 2020-11-25 LAB — CMP (CANCER CENTER ONLY)
ALT: 14 U/L (ref 0–44)
AST: 16 U/L (ref 15–41)
Albumin: 4.2 g/dL (ref 3.5–5.0)
Alkaline Phosphatase: 59 U/L (ref 38–126)
Anion gap: 7 (ref 5–15)
BUN: 12 mg/dL (ref 8–23)
CO2: 28 mmol/L (ref 22–32)
Calcium: 9 mg/dL (ref 8.9–10.3)
Chloride: 101 mmol/L (ref 98–111)
Creatinine: 0.41 mg/dL — ABNORMAL LOW (ref 0.44–1.00)
GFR, Estimated: >60 mL/min (ref >60)
Glucose, Bld: 86 mg/dL (ref 70–99)
Potassium: 4.2 mmol/L (ref 3.5–5.1)
Sodium: 136 mmol/L (ref 135–145)
Total Bilirubin: 0.4 mg/dL (ref 0.3–1.2)
Total Protein: 6.7 g/dL (ref 6.5–8.1)

## 2020-11-25 LAB — CBC WITH DIFFERENTIAL (CANCER CENTER ONLY)
Abs Immature Granulocytes: 0.01 10*3/uL (ref 0.00–0.07)
Basophils Absolute: 0 10*3/uL (ref 0.0–0.1)
Basophils Relative: 0 %
Eosinophils Absolute: 0.2 10*3/uL (ref 0.0–0.5)
Eosinophils Relative: 3 %
HCT: 38.4 % (ref 36.0–46.0)
Hemoglobin: 12.3 g/dL (ref 12.0–15.0)
Immature Granulocytes: 0 %
Lymphocytes Relative: 15 %
Lymphs Abs: 0.8 10*3/uL (ref 0.7–4.0)
MCH: 29 pg (ref 26.0–34.0)
MCHC: 32 g/dL (ref 30.0–36.0)
MCV: 90.6 fL (ref 80.0–100.0)
Monocytes Absolute: 0.4 10*3/uL (ref 0.1–1.0)
Monocytes Relative: 7 %
Neutro Abs: 4.2 10*3/uL (ref 1.7–7.7)
Neutrophils Relative %: 75 %
Platelet Count: 208 10*3/uL (ref 150–400)
RBC: 4.24 MIL/uL (ref 3.87–5.11)
RDW: 13.1 % (ref 11.5–15.5)
WBC Count: 5.6 10*3/uL (ref 4.0–10.5)
nRBC: 0 % (ref 0.0–0.2)

## 2020-11-25 LAB — TSH: TSH: 1.303 u[IU]/mL (ref 0.350–4.500)

## 2020-11-25 MED ORDER — SODIUM CHLORIDE 0.9 % IV SOLN
Freq: Once | INTRAVENOUS | Status: AC
  Filled 2020-11-25: qty 250

## 2020-11-25 MED ORDER — SODIUM CHLORIDE 0.9 % IV SOLN
200.0000 mg | Freq: Once | INTRAVENOUS | Status: AC
  Administered 2020-11-25: 200 mg via INTRAVENOUS
  Filled 2020-11-25: qty 8

## 2020-11-25 NOTE — Progress Notes (Signed)
[facility] OFFICE PROGRESS NOTE   Diagnosis: Non-small cell lung cancer  INTERVAL HISTORY:   Brooke [name] completed another treatment with pembrolizumab on 11/04/2020.  No rash or diarrhea.  She feels well.  She reports a recent episode of vertigo lasting 1 minute.  Objective:  Vital signs in last 24 hours:  Blood pressure (!) 155/98, pulse 83, temperature 98.6 F (37 C), temperature source Oral, resp. rate 20, weight 98 lb (44.5 kg), SpO2 99 %.    Resp: Mild bilateral wheeze, bronchial sounds at the left upper posterior chest, no respiratory distress Cardio: Regular rate and rhythm GI: No hepatosplenomegaly Vascular: No leg edema    Lab Results:  Lab Results  Component Value Date   WBC 5.6 11/25/2020   HGB 12.3 11/25/2020   HCT 38.4 11/25/2020   MCV 90.6 11/25/2020   PLT 208 11/25/2020   NEUTROABS 4.2 11/25/2020    CMP  Lab Results  Component Value Date   NA 136 11/04/2020   K 4.1 11/04/2020   CL 101 11/04/2020   CO2 28 11/04/2020   GLUCOSE 96 11/04/2020   BUN 17 11/04/2020   CREATININE 0.52 11/04/2020   CALCIUM 9.2 11/04/2020   PROT 6.8 11/04/2020   ALBUMIN 4.0 11/04/2020   AST 15 11/04/2020   ALT 15 11/04/2020   ALKPHOS 55 11/04/2020   BILITOT 0.5 11/04/2020   GFRNONAA >60 11/04/2020   GFRAA >60 02/05/2020     Medications: I have reviewed the patient's current medications.   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% Left lung radiation 11/08/2016 through 11/21/2016 CT chest 12/12/2016-reexpansion of left upper  lobe with a decreased left upper lobe mass, unchanged mediastinal adenopathy, progression of a splenic metastasis/pancreatic tail/gastrohepatic ligament metastasis, right lower lobe pneumonia Cycle 1 Pembrolizumab 12/14/2016 Cycle 2 Pembrolizumab 01/03/2017 Cycle 3 Pembrolizumab 01/24/2017 Cycle 4 Pembrolizumab 02/12/2017 CT 03/05/2018-decrease in left upper lobe mass, mediastinal adenopathy, and splenic mass Cycle 5 pembrolizumab 03/07/2017 Cycle 6 Pembrolizumab 04/02/2017 Cycle 7 pembrolizumab 04/25/2017 Cycle 8 Pembrolizumab 05/14/2017 Cycle 9 Pembrolizumab 06/06/2017 CT 06/20/2017-mild decrease in left upper lobe mass, mild increase in paramediastinal left upper lobe nodule-radiation change?, decreased splenic metastasis Cycle 10 pembrolizumab 06/25/2017 Cycle 11 pembrolizumab 07/18/2017 Cycle 12 pembrolizumab 08/29/2017 Cycle 13 pembrolizumab 09/17/2017 Cycle 14 Pembrolizumab 10/10/2017 CT chest 10/24/2017- slight enlargement of small mediastinal lymph nodes, increased left upper lobe consolidation, decreased size of spleen lesion Cycle 15 pembrolizumab 10/29/2017 Cycle 16 Pembrolizumab 11/21/2017 Cycle 17 Pembrolizumab 12/11/2017 Cycle 18 pembrolizumab 01/02/2018 Cycle 19 Pembrolizumab 01/21/2018 Cycle 20 Pembrolizumab 02/13/2018 CT chest 02/27/2018- decreased left paratracheal node, progressive consolidation/fibrosis in the left upper lung, decreased size of splenic mass Cycle 21 pembrolizumab 03/04/2018 Cycle 22 Pembrolizumab 03/27/2018  Cycle 23 pembrolizumab 04/15/2018 Cycle 24 pembrolizumab 05/08/2018 Cycle 25 Pembrolizumab 05/27/2018 CT chest 06/16/2018- stable left upper lung radiation fibrosis with no evidence of local tumor recurrence.  Decreased left paratracheal adenopathy.  No new or progressive metastatic disease in the chest.  Gastrohepatic ligament lymphadenopathy stable.  Splenic metastasis decreased.  T5 vertebral compression fracture with associated patchy sclerosis and no discrete  osseous lesion, new in the interval. Cycle 26 Pembrolizumab 06/19/2018 Cycle 27 pembrolizumab 07/08/2018 Cycle  28 pembrolizumab 07/31/2018 Cycle 29 pembrolizumab 08/19/2018 Cycle 30 Pembrolizumab 09/11/2018 Cycle 31 pembrolizumab 09/30/2018 Cycle 32 pembrolizumab 10/23/2018 CT chest 10/28/2018- left apical pleural-parenchymal opacity consistent with radiation scarring has become more confluent likely representing evolutionary change.  Continued further decrease in size of index left paratracheal node and splenic metastasis.  Gastrohepatic ligament lymph node not changed. Cycle 33 Pembrolizumab 11/11/2018 Cycle 34 pembrolizumab 12/04/2018 Cycle 35 pembrolizumab 12/23/2018 CT chest 02/12/2019- posttreatment changes left upper lobe unchanged.  Continued decrease in size of splenic metastasis.  Stable appearance of left paratracheal lymph nodes.  Stable gastrohepatic adenopathy.  Incomplete visualization of retroperitoneal lymph nodes in the upper abdomen. CT chest 07/02/2019-stable post treatment related changes left lung.  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  Slight regression of enlarged likely metastatic gastrohepatic lymph node. CT chest 11/11/2019-interval enlargement of a right paratracheal lymph node measuring 14 mm, increased from 5 mm.  No new lung mass or nodularity.  New adenopathy in the upper abdomen.  2.7 cm lesion gastrohepatic ligament.  Necrotic node at the splenic hilum measures 4.7 cm.  Large node along the IVC left upper quadrant measures 2.7 cm.  Similar node left adjacent the aorta measures 1.8 cm.  Enhancing lesion in the spleen is unchanged.  Several low-density lesions in the liver are unchanged. Pembrolizumab resumed on a 3-week schedule 12/03/2019 CT chest 02/22/2020-interval resolution of previously demonstrated left paratracheal and upper abdominal adenopathy.  Stable treated metastasis within the spleen.  Stable radiation changes in the left lung with left hilar distortion.  No evidence  of local recurrence or progressive metastatic disease. Continuation of pembrolizumab every 3 weeks 02/26/2020 CT chest 06/28/2020-no change in left upper lobe consolidation, unchanged spleen metastasis, no evidence of progressive disease Pembrolizumab continued every 3 weeks 07/01/2020 CT chest 11/02/2020-stable posttreatment changes at the left hilum and left upper lobe, decreased size of spleen lesion, no evidence of disease progression Pembrolizumab every 3 weeks continued     2.  05/29/2019 laryngoscopy-exophytic appearing mass mid right vocal cord.   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  Tumor cells negative for p16, CMV and HSV 2.  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 Radiation 07/29/2019-08/25/2019   3. History of acute respiratory failure secondary to #1   4. Oxygen dependent COPD   5. History of a NSTEMI 2015   6. Right lung pneumonia on chest CT 12/12/2016-treated with Levaquin   7.  Grade 2 rash possibly related to Pembrolizumab.  Treatment held 08/06/2017.  Improved 08/29/2017, treatment resumed, rash has resolved   8.  Vertigo January 2022-improved with course of Augmentin for ear/sinus infection     Disposition: Brooke [name] appears stable.  She continues to tolerate the pembrolizumab well.  There is no clinical evidence of disease progression.  She will complete another treatment with pembrolizumab today.  Brooke [name] will return for an office visit and pembrolizumab in 3 weeks.  Betsy Coder, MD  11/25/2020  9:42 AM

## 2020-11-25 NOTE — Patient Instructions (Signed)
[facility]  Discharge Instructions: Thank you for choosing [facility] to provide your oncology and hematology care.   If you have a lab appointment with the [facility], please go directly to the [facility] and check in at the registration area.   Wear comfortable clothing and clothing appropriate for easy access to any Portacath or PICC line.   We strive to give you quality time with your provider. You may need to reschedule your appointment if you arrive late (15 or more minutes).  Arriving late affects you and other patients whose appointments are after yours.  Also, if you miss three or more appointments without notifying the office, you may be dismissed from the clinic at the provider's discretion.      For prescription refill requests, have your pharmacy contact our office and allow 72 hours for refills to be completed.    Today you received the following chemotherapy and/or immunotherapy agents pembrolizumab      To help prevent nausea and vomiting after your treatment, we encourage you to take your nausea medication as directed.  BELOW ARE SYMPTOMS THAT SHOULD BE REPORTED IMMEDIATELY: *FEVER GREATER THAN 100.4 F (38 C) OR HIGHER *CHILLS OR SWEATING *NAUSEA AND VOMITING THAT IS NOT CONTROLLED WITH YOUR NAUSEA MEDICATION *UNUSUAL SHORTNESS OF BREATH *UNUSUAL BRUISING OR BLEEDING *URINARY PROBLEMS (pain or burning when urinating, or frequent urination) *BOWEL PROBLEMS (unusual diarrhea, constipation, pain near the anus) TENDERNESS IN MOUTH AND THROAT WITH OR WITHOUT PRESENCE OF ULCERS (sore throat, sores in mouth, or a toothache) UNUSUAL RASH, SWELLING OR PAIN  UNUSUAL VAGINAL DISCHARGE OR ITCHING   Items with * indicate a potential emergency and should be followed up as soon as possible or go to the Emergency Department if any problems should occur.  Please show the CHEMOTHERAPY ALERT CARD or IMMUNOTHERAPY ALERT CARD at check-in to  the Emergency Department and triage nurse.  Should you have questions after your visit or need to cancel or reschedule your appointment, please contact [facility]  Dept: [phone]  and follow the prompts.  Office hours are 8:00 a.m. to 4:30 p.m. Monday - Friday. Please note that voicemails left after 4:00 p.m. may not be returned until the following business day.  We are closed weekends and major holidays. You have access to a nurse at all times for urgent questions. Please call the main number to the clinic Dept: [phone] and follow the prompts.   For any non-urgent questions, you may also contact your provider using MyChart. We now offer e-Visits for anyone 59 and older to request care online for non-urgent symptoms. For details visit [email or web address].   Also download the MyChart app! Go to the app store, search "MyChart", open the app, select [facility], and log in with your MyChart username and password.  Due to Covid, a mask is required upon entering the hospital/clinic. If you do not have a mask, one will be given to you upon arrival. For doctor visits, patients may have 1 support person aged 23 or older with them. For treatment visits, patients cannot have anyone with them due to current Covid guidelines and our immunocompromised population.   Pembrolizumab injection What is this medication? PEMBROLIZUMAB (pem broe liz ue mab) is a monoclonal antibody. It is used totreat certain types of cancer. This medicine may be used for other purposes; ask your health care provider orpharmacist if you have questions. COMMON BRAND NAME(S): Keytruda What should I  tell my care team before I take this medication? They need to know if you have any of these conditions: autoimmune diseases like Crohn's disease, ulcerative colitis, or lupus have had or planning to have an allogeneic stem cell transplant (uses someone else's stem cells) history of organ  transplant history of chest radiation nervous system problems like myasthenia gravis or Guillain-Barre syndrome an unusual or allergic reaction to pembrolizumab, other medicines, foods, dyes, or preservatives pregnant or trying to get pregnant breast-feeding How should I use this medication? This medicine is for infusion into a vein. It is given by a health careprofessional in a hospital or clinic setting. A special MedGuide will be given to you before each treatment. Be sure to readthis information carefully each time. Talk to your pediatrician regarding the use of this medicine in children. While this drug may be prescribed for children as young as 6 months for selectedconditions, precautions do apply. Overdosage: If you think you have taken too much of this medicine contact apoison control center or emergency room at once. NOTE: This medicine is only for you. Do not share this medicine with others. What if I miss a dose? It is important not to miss your dose. Call your doctor or health careprofessional if you are unable to keep an appointment. What may interact with this medication? Interactions have not been studied. This list may not describe all possible interactions. Give your health care provider a list of all the medicines, herbs, non-prescription drugs, or dietary supplements you use. Also tell them if you smoke, drink alcohol, or use illegaldrugs. Some items may interact with your medicine. What should I watch for while using this medication? Your condition will be monitored carefully while you are receiving thismedicine. You may need blood work done while you are taking this medicine. Do not become pregnant while taking this medicine or for 4 months after stopping it. Women should inform their doctor if they wish to become pregnant or think they might be pregnant. There is a potential for serious side effects to an unborn child. Talk to your health care professional or pharmacist for  more information. Do not breast-feed an infant while taking this medicine orfor 4 months after the last dose. What side effects may I notice from receiving this medication? Side effects that you should report to your doctor or health care professionalas soon as possible: allergic reactions like skin rash, itching or hives, swelling of the face, lips, or tongue bloody or black, tarry breathing problems changes in vision chest pain chills confusion constipation cough diarrhea dizziness or feeling faint or lightheaded fast or irregular heartbeat fever flushing joint pain low blood counts - this medicine may decrease the number of white blood cells, red blood cells and platelets. You may be at increased risk for infections and bleeding. muscle pain muscle weakness pain, tingling, numbness in the hands or feet persistent headache redness, blistering, peeling or loosening of the skin, including inside the mouth signs and symptoms of high blood sugar such as dizziness; dry mouth; dry skin; fruity breath; nausea; stomach pain; increased hunger or thirst; increased urination signs and symptoms of kidney injury like trouble passing urine or change in the amount of urine signs and symptoms of liver injury like dark urine, light-colored stools, loss of appetite, nausea, right upper belly pain, yellowing of the eyes or skin sweating swollen lymph nodes weight loss Side effects that usually do not require medical attention (report to yourdoctor or health care professional if they continue  or are bothersome): decreased appetite hair loss tiredness This list may not describe all possible side effects. Call your doctor for medical advice about side effects. You may report side effects to FDA at1-800-FDA-1088. Where should I keep my medication? This drug is given in a hospital or clinic and will not be stored at home. NOTE: This sheet is a summary. It may not cover all possible information. If you  have questions about this medicine, talk to your doctor, pharmacist, orhealth care provider.  2022 Elsevier/Gold Standard (2019-03-25 21:44:53)

## 2020-12-08 ENCOUNTER — Other Ambulatory Visit: Payer: Self-pay

## 2020-12-08 ENCOUNTER — Emergency Department (HOSPITAL_BASED_OUTPATIENT_CLINIC_OR_DEPARTMENT_OTHER)
Admission: EM | Admit: 2020-12-08 | Discharge: 2020-12-08 | Disposition: A | Discharge status: Home / Self Care | Payer: Medicare Other | Attending: Emergency Medicine | Admitting: Emergency Medicine

## 2020-12-08 ENCOUNTER — Telehealth: Payer: Self-pay | Admitting: Internal Medicine

## 2020-12-08 ENCOUNTER — Emergency Department (HOSPITAL_BASED_OUTPATIENT_CLINIC_OR_DEPARTMENT_OTHER): Payer: Medicare Other | Admitting: Radiology

## 2020-12-08 ENCOUNTER — Encounter (HOSPITAL_BASED_OUTPATIENT_CLINIC_OR_DEPARTMENT_OTHER): Payer: Self-pay

## 2020-12-08 DIAGNOSIS — J441 Chronic obstructive pulmonary disease with (acute) exacerbation: Secondary | ICD-10-CM | POA: Insufficient documentation

## 2020-12-08 DIAGNOSIS — Z20822 Contact with and (suspected) exposure to covid-19: Secondary | ICD-10-CM | POA: Diagnosis not present

## 2020-12-08 DIAGNOSIS — Z7951 Long term (current) use of inhaled steroids: Secondary | ICD-10-CM | POA: Diagnosis not present

## 2020-12-08 DIAGNOSIS — R0600 Dyspnea, unspecified: Secondary | ICD-10-CM | POA: Diagnosis present

## 2020-12-08 DIAGNOSIS — Z79899 Other long term (current) drug therapy: Secondary | ICD-10-CM | POA: Insufficient documentation

## 2020-12-08 DIAGNOSIS — Z87891 Personal history of nicotine dependence: Secondary | ICD-10-CM | POA: Insufficient documentation

## 2020-12-08 DIAGNOSIS — I10 Essential (primary) hypertension: Secondary | ICD-10-CM | POA: Diagnosis not present

## 2020-12-08 DIAGNOSIS — Z8521 Personal history of malignant neoplasm of larynx: Secondary | ICD-10-CM | POA: Diagnosis not present

## 2020-12-08 DIAGNOSIS — Z85118 Personal history of other malignant neoplasm of bronchus and lung: Secondary | ICD-10-CM | POA: Insufficient documentation

## 2020-12-08 DIAGNOSIS — Z955 Presence of coronary angioplasty implant and graft: Secondary | ICD-10-CM | POA: Insufficient documentation

## 2020-12-08 LAB — RESP PANEL BY RT-PCR (FLU A&B, COVID) ARPGX2
Influenza A by PCR: NEGATIVE
Influenza B by PCR: NEGATIVE
SARS Coronavirus 2 by RT PCR: NEGATIVE

## 2020-12-08 MED ORDER — METHYLPREDNISOLONE SODIUM SUCC 125 MG IJ SOLR
125.0000 mg | Freq: Once | INTRAMUSCULAR | Status: AC
  Administered 2020-12-08: 125 mg via INTRAVENOUS
  Filled 2020-12-08: qty 2

## 2020-12-08 MED ORDER — DEXAMETHASONE SODIUM PHOSPHATE 10 MG/ML IJ SOLN
10.0000 mg | Freq: Once | INTRAMUSCULAR | Status: DC
  Filled 2020-12-08: qty 1

## 2020-12-08 NOTE — ED Notes (Signed)
Patient transported to X-ray 

## 2020-12-08 NOTE — Telephone Encounter (Signed)
Called and spoke with Brooke [name], Patient caregiver.  Brooke [name] said Patient requested to see Dr. Melvyn Novas this morning, if possible.  Patient stated she is having a cough, wheeze, and experienced some sob.  Patient thinks it is related to [facility], because she never coughed while taking Symbicort.  Brooke [name] stated she would be with Patient until 1pm, if Dr. Melvyn Novas could see her this morning.  LOV 09/12/20-  Instructions  Depomedrol 120 mg IM today   No change other  medications   Make sure you check your oxygen saturation  at your highest level of activity  to be sure it stays over 90% and adjust  02 flow upward to maintain this level if needed but remember to turn it back to previous settings when you stop (to conserve your supply).     Please schedule a follow up visit in 6 months but call sooner if needed      Message routed to Dr. Melvyn Novas to advise

## 2020-12-08 NOTE — ED Notes (Signed)
ED Provider at bedside. 

## 2020-12-08 NOTE — Telephone Encounter (Signed)
Pt is currently at the ED being evaluated. Nothing further needed.

## 2020-12-08 NOTE — Telephone Encounter (Signed)
Sorry to hear that > stop breztri and just just duoneb qid until we can get her in  Medrol 4 mg x 4 for 2 days,  x 2 for 2 days,  x 1 x 2 days (has tolerated this in past and can't take prednisone)   If getting worse waiting for ov then go to ER

## 2020-12-08 NOTE — Discharge Instructions (Addendum)
If you develop fever, new or worsening shortness of breath, cough, chest pain, or any other new/concerning symptoms then return to the ER for evaluation.

## 2020-12-08 NOTE — Telephone Encounter (Signed)
Resumed symbicort x 2 days but actually worse since then and can't take medrol and no relief from duoneb > to ER

## 2020-12-08 NOTE — ED Provider Notes (Signed)
[facility] EMERGENCY DEPT Provider Note   CSN: [id number] Arrival date & time: 12/08/20  1116     History Chief Complaint  Patient presents with   Shortness of Breath    Brooke [name] is a 75 y.o. female.  HPI 75 year old female with a history of chronic respiratory failure on 2 L home oxygen presents with dyspnea and wheezing.  She is had a little bit of a cough for months.  Started after she started a new inhaler.  However today the cough seems to be a little worse with some white sputum and she had a lot of inspiratory and expiratory wheezing.  Took a DuoNeb but it did not immediately help.  Due to this she presented to the ED because her pulmonologist could not see her.  Felt like there was a rock in her left chest which is what typically happens with a COPD exacerbation.  No leg swelling.  No fevers during this time.  Right now she is actually feeling a whole lot better and does not think she is having any wheezing and no new dyspnea.  Typically receives an IV steroid because she has problems with oral steroids.  Past Medical History:  Diagnosis Date   Chronic respiratory failure ([place])    a. on home O2.   COPD (chronic obstructive pulmonary disease) ([facility])    a. Home O2.   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   Protein-calorie malnutrition, severe 11/02/2016   SOB (shortness of breath)    Community acquired pneumonia of left upper lobe of lung 10/22/2016   COPD exacerbation ([place]) 10/22/2016   Hypokalemia 10/22/2016   Microcytic anemia 10/22/2016   Mass of upper lobe of left lung 02/15/2016   Chronic respiratory failure with hypoxia ([place]) 02/15/2016   GERD (gastroesophageal reflux disease)    Former tobacco use    HLD (hyperlipidemia) 06/08/2013   NSTEMI (non-ST elevated myocardial infarction) ([place]) 06/05/2013   COPD  GOLD IV  06/05/2013    Past Surgical History:  Procedure Laterality Date   LEFT HEART CATHETERIZATION WITH CORONARY ANGIOGRAM N/A 06/08/2013   Procedure: LEFT HEART CATHETERIZATION WITH CORONARY ANGIOGRAM;  Surgeon: Sanda Klein, MD;  Location: [facility] CATH LAB;  Service: Cardiovascular;  Laterality: N/A;     OB History   No obstetric history on file.     Family History  Problem Relation Age of Onset   Lung cancer Father    Heart attack Neg Hx    Stroke Neg Hx    Hypertension Neg Hx     Social History   Tobacco Use   Smoking status: Former    Packs/day: 1.00    Years: 30.00    Pack years: 30.00    Types: Cigarettes    Quit date: 05/08/2007  Years since quitting: 13.5   Smokeless tobacco: Never  Vaping Use   Vaping Use: Never used  Substance Use Topics   Alcohol use: Yes    Comment: rarely   Drug use: No    Home Medications Prior to Admission medications   Medication Sig Start Date End Date Taking? Authorizing Provider  acetaminophen (TYLENOL) 325 MG tablet Per bottle as needed    [provider]  albuterol (PROVENTIL HFA;VENTOLIN HFA) 108 (90 BASE) MCG/ACT inhaler Inhale 2 puffs into the lungs every 4 (four) hours as needed for wheezing or shortness of breath. **PLAN B**    [provider]  ALPRAZolam Duanne Moron) 0.25 MG tablet 1/2-1 twice daily as needed 01/21/17   [provider]  amLODipine (NORVASC) 2.5 MG tablet Take by mouth based on top number of BP. 1 tab (2.5mg ) if BP 140-159, 2 tabs (5mg ) for 160-179, 3 tabs (7.5 mg) for BP >180 05/27/20   Skeet Latch, MD  atorvastatin (LIPITOR) 20 MG tablet TAKE 1 TABLET(20 MG) BY MOUTH DAILY AT 6 PM 08/12/20   Skeet Latch, MD  Budeson-Glycopyrrol-Formoterol (BREZTRI AEROSPHERE) 160-9-4.8 MCG/ACT AERO Inhale 2 puffs into the lungs every 12 (twelve) hours. 09/16/20   Tanda Rockers, MD  guaiFENesin (MUCINEX) 600 MG 12 hr tablet Take 600 mg by mouth 2 (two) times daily as needed for cough or to loosen phlegm. Patient not taking: No sig reported    [provider]  ibuprofen (ADVIL,MOTRIN) 600 MG tablet Take 600 mg by mouth every 6 (six) hours as needed.    [provider]  ipratropium-albuterol (DUONEB) 0.5-2.5 (3) MG/3ML SOLN Take 3 mLs by nebulization every 4 (four) hours as needed (wheezing/shortness of breath). **PLAN C**    [provider]  nitroGLYCERIN (NITROSTAT) 0.4 MG SL tablet ONE TABLET UNDER TONGUE AS NEEDED FOR CHEST PAIN EVERY 5 MINUTES FOR 3 DOSES Patient not taking: No sig reported 05/10/15   Darlin Coco, MD  NON FORMULARY [facility]  Onychomycosis Nail Lacquer -  Fluconazole 2%, Terbinafine 1% DMSO/undecylenic acid 25% Apply to affected nail once daily Qty. 120 gm 3 refills    [provider]  OXYGEN 2lpm 24/7    [provider]  Probiotic Product (PROBIOTIC ADVANCED PO) Take 1 tablet by mouth daily. 01/15/20   [provider]  Simethicone 180 MG CAPS Use as needed    [provider]    Allergies    Afrin [oxymetazoline], Codeine, Imdur [isosorbide dinitrate], Prednisone, and Pulmicort [budesonide]  Review of Systems   Review of Systems  Constitutional:  Negative for fever.  Respiratory:  Positive for cough, shortness of breath and wheezing.   Cardiovascular:  Positive for chest pain. Negative for leg swelling.   All other systems reviewed and are negative.  Physical Exam Updated Vital Signs BP (!) 171/83 (BP Location: Right Arm)   Pulse 78   Temp 98.3 F (36.8 C) (Oral)   Resp 17   Ht 5\' 2"  (1.575 m)   Wt 44.5 kg   SpO2 100%   BMI 17.94 kg/m   Physical Exam Vitals and nursing note reviewed.  Constitutional:      Appearance: She is well-developed.  HENT:     Head: Normocephalic and atraumatic.     Right Ear: External ear normal.     Left Ear: External ear normal.     Nose: Nose normal.  Eyes:     General:        Right eye: No discharge.  Left eye: No discharge.  Cardiovascular:     Rate and Rhythm: Normal rate and regular rhythm.     Heart sounds: Normal heart sounds.  Pulmonary:     Effort: Pulmonary effort is normal. No tachypnea, accessory muscle usage or respiratory distress.     Breath sounds: Normal breath sounds. No decreased breath sounds, wheezing, rhonchi or rales.  Abdominal:     Palpations: Abdomen is soft.     Tenderness: There is no abdominal tenderness.  Musculoskeletal:     Right lower leg: No edema.     Left lower leg: No edema.  Skin:    General: Skin is warm and dry.  Neurological:     Mental Status: She is alert.  Psychiatric:        Mood and Affect: Mood is not anxious.    ED Results / Procedures / Treatments   Labs (all labs ordered are listed, but only abnormal results are displayed) Labs Reviewed  RESP PANEL BY RT-PCR (FLU A&B, COVID) [id number]    EKG None  Radiology DG Chest 2 View  Result Date: 12/08/2020 CLINICAL DATA:  Cough. Shortness of breath. Wheezing. History of non-small cell lung cancer. EXAM: CHEST - 2 VIEW COMPARISON:  CT 11/02/2020.  Chest x-ray 08/22/2020. FINDINGS: Mediastinal and hilar structures are stable. Heart size stable. COPD. Stable left apical pleural-parenchymal thickening consistent with scarring. No acute infiltrate. No pleural effusion or pneumothorax. Degenerative change thoracic spine. Stable upper thoracic  vertebral body compression fracture. IMPRESSION: Stable left apical pleural-parenchymal thickening consistent scarring. COPD. No acute abnormality. Electronically Signed   By: Marcello Moores  Register   On: 12/08/2020 12:19    Procedures Procedures   Medications Ordered in ED Medications  methylPREDNISolone sodium succinate (SOLU-MEDROL) 125 mg/2 mL injection 125 mg (125 mg Intravenous Given 12/08/20 1210)    ED Course  I have reviewed the triage vital signs and the nursing notes.  Pertinent labs & imaging results that were available during my care of the patient were reviewed by me and considered in my medical decision making (see chart for details).    MDM Rules/Calculators/A&P                           Patient presents with what sounds like a mild and now resolved COPD exacerbation.  She is hoping to get an IV dose of steroids as this is what is typically done for her.  I discussed with Decadron because she does not want to take any type of oral steroids afterwards as they have caused some side effects.  However she really wants Solu-Medrol and while I discussed that this really would not give her much benefit beyond several hours today, she still wants to do that.  No oral steroids.  There is no current wheezing and her chest x-ray is unremarkable.  Given the report of wheezing and improvement after using nebulizers I have pretty low suspicion of a different source of her symptoms such as ACS, PE, etc.  Given return precautions. Final Clinical Impression(s) / ED Diagnoses Final diagnoses:  COPD exacerbation [facility])    Rx / DC Orders ED Discharge Orders     None        Sherwood Gambler, MD 12/08/20 1338

## 2020-12-08 NOTE — ED Triage Notes (Signed)
Pt comes to ED with her caregiver who reports pt has been experiencing shortness of breath for approximately 2 days.  Pt wearing nasal cannula O2 on arrival.  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  Pt caregiver states pt has stated she feels like there is a "rock" in her lungs.

## 2020-12-08 NOTE — Telephone Encounter (Signed)
Call returned to patient, made aware of MW recommendations. Patient states she is not able to take the medrol pill stating it drove her crazy. Patient would not explain what that meant when I inquired. She states she has always taken the shot. I inquired as to whether she ever taken the pill form and she did not sound confident that she has however repeated it makes her go crazy. I made her aware that being that we do not have an appt available in the next two weeks the next recommendation was to go to the ER. She requested that I ask MW for any other recommendations.   MW please advise. Patient aware of previous recommendations however claims she cannot take the pill version of the medrol. Thanks :)

## 2020-12-11 ENCOUNTER — Other Ambulatory Visit: Payer: Self-pay | Admitting: Oncology

## 2020-12-16 ENCOUNTER — Inpatient Hospital Stay: Payer: Medicare Other | Attending: Oncology

## 2020-12-16 ENCOUNTER — Other Ambulatory Visit: Payer: Self-pay

## 2020-12-16 ENCOUNTER — Inpatient Hospital Stay (HOSPITAL_BASED_OUTPATIENT_CLINIC_OR_DEPARTMENT_OTHER): Payer: Medicare Other | Admitting: Nurse Practitioner

## 2020-12-16 ENCOUNTER — Inpatient Hospital Stay: Payer: Medicare Other

## 2020-12-16 ENCOUNTER — Encounter: Payer: Self-pay | Admitting: Nurse Practitioner

## 2020-12-16 VITALS — BP 125/67 | HR 82 | Temp 97.7°F | Resp 18 | Ht 62.0 in | Wt 92.6 lb

## 2020-12-16 DIAGNOSIS — C7889 Secondary malignant neoplasm of other digestive organs: Secondary | ICD-10-CM | POA: Insufficient documentation

## 2020-12-16 DIAGNOSIS — J440 Chronic obstructive pulmonary disease with acute lower respiratory infection: Secondary | ICD-10-CM | POA: Insufficient documentation

## 2020-12-16 DIAGNOSIS — I252 Old myocardial infarction: Secondary | ICD-10-CM | POA: Diagnosis not present

## 2020-12-16 DIAGNOSIS — C3412 Malignant neoplasm of upper lobe, left bronchus or lung: Secondary | ICD-10-CM

## 2020-12-16 DIAGNOSIS — Z79899 Other long term (current) drug therapy: Secondary | ICD-10-CM | POA: Insufficient documentation

## 2020-12-16 DIAGNOSIS — Z5112 Encounter for antineoplastic immunotherapy: Secondary | ICD-10-CM | POA: Diagnosis present

## 2020-12-16 DIAGNOSIS — Z9981 Dependence on supplemental oxygen: Secondary | ICD-10-CM | POA: Insufficient documentation

## 2020-12-16 LAB — CMP (CANCER CENTER ONLY)
ALT: 15 U/L (ref 0–44)
AST: 16 U/L (ref 15–41)
Albumin: 3.9 g/dL (ref 3.5–5.0)
Alkaline Phosphatase: 49 U/L (ref 38–126)
Anion gap: 7 (ref 5–15)
BUN: 12 mg/dL (ref 8–23)
CO2: 29 mmol/L (ref 22–32)
Calcium: 9 mg/dL (ref 8.9–10.3)
Chloride: 100 mmol/L (ref 98–111)
Creatinine: 0.45 mg/dL (ref 0.44–1.00)
GFR, Estimated: >60 mL/min (ref >60)
Glucose, Bld: 100 mg/dL — ABNORMAL HIGH (ref 70–99)
Potassium: 3.8 mmol/L (ref 3.5–5.1)
Sodium: 136 mmol/L (ref 135–145)
Total Bilirubin: 0.6 mg/dL (ref 0.3–1.2)
Total Protein: 6.7 g/dL (ref 6.5–8.1)

## 2020-12-16 LAB — CBC WITH DIFFERENTIAL (CANCER CENTER ONLY)
Abs Immature Granulocytes: 0.02 10*3/uL (ref 0.00–0.07)
Basophils Absolute: 0 10*3/uL (ref 0.0–0.1)
Basophils Relative: 0 %
Eosinophils Absolute: 0.1 10*3/uL (ref 0.0–0.5)
Eosinophils Relative: 3 %
HCT: 39.7 % (ref 36.0–46.0)
Hemoglobin: 13 g/dL (ref 12.0–15.0)
Immature Granulocytes: 0 %
Lymphocytes Relative: 21 %
Lymphs Abs: 1.1 10*3/uL (ref 0.7–4.0)
MCH: 29.3 pg (ref 26.0–34.0)
MCHC: 32.7 g/dL (ref 30.0–36.0)
MCV: 89.4 fL (ref 80.0–100.0)
Monocytes Absolute: 0.4 10*3/uL (ref 0.1–1.0)
Monocytes Relative: 8 %
Neutro Abs: 3.4 10*3/uL (ref 1.7–7.7)
Neutrophils Relative %: 68 %
Platelet Count: 218 10*3/uL (ref 150–400)
RBC: 4.44 MIL/uL (ref 3.87–5.11)
RDW: 13.3 % (ref 11.5–15.5)
WBC Count: 5 10*3/uL (ref 4.0–10.5)
nRBC: 0 % (ref 0.0–0.2)

## 2020-12-16 MED ORDER — SODIUM CHLORIDE 0.9 % IV SOLN
Freq: Once | INTRAVENOUS | Status: AC
Start: 2020-12-16 — End: 2020-12-16
  Filled 2020-12-16: qty 250

## 2020-12-16 MED ORDER — SODIUM CHLORIDE 0.9 % IV SOLN
200.0000 mg | Freq: Once | INTRAVENOUS | Status: AC
  Administered 2020-12-16: 200 mg via INTRAVENOUS
  Filled 2020-12-16: qty 8

## 2020-12-16 NOTE — Patient Instructions (Signed)
[facility]  Discharge Instructions: Thank you for choosing [facility] to provide your oncology and hematology care.   If you have a lab appointment with the [facility], please go directly to the [facility] and check in at the registration area.   Wear comfortable clothing and clothing appropriate for easy access to any Portacath or PICC line.   We strive to give you quality time with your provider. You may need to reschedule your appointment if you arrive late (15 or more minutes).  Arriving late affects you and other patients whose appointments are after yours.  Also, if you miss three or more appointments without notifying the office, you may be dismissed from the clinic at the provider's discretion.      For prescription refill requests, have your pharmacy contact our office and allow 72 hours for refills to be completed.    Today you received the following chemotherapy and/or immunotherapy agents pembrolizumab      To help prevent nausea and vomiting after your treatment, we encourage you to take your nausea medication as directed.  BELOW ARE SYMPTOMS THAT SHOULD BE REPORTED IMMEDIATELY: *FEVER GREATER THAN 100.4 F (38 C) OR HIGHER *CHILLS OR SWEATING *NAUSEA AND VOMITING THAT IS NOT CONTROLLED WITH YOUR NAUSEA MEDICATION *UNUSUAL SHORTNESS OF BREATH *UNUSUAL BRUISING OR BLEEDING *URINARY PROBLEMS (pain or burning when urinating, or frequent urination) *BOWEL PROBLEMS (unusual diarrhea, constipation, pain near the anus) TENDERNESS IN MOUTH AND THROAT WITH OR WITHOUT PRESENCE OF ULCERS (sore throat, sores in mouth, or a toothache) UNUSUAL RASH, SWELLING OR PAIN  UNUSUAL VAGINAL DISCHARGE OR ITCHING   Items with * indicate a potential emergency and should be followed up as soon as possible or go to the Emergency Department if any problems should occur.  Please show the CHEMOTHERAPY ALERT CARD or IMMUNOTHERAPY ALERT CARD at check-in to  the Emergency Department and triage nurse.  Should you have questions after your visit or need to cancel or reschedule your appointment, please contact [facility]  Dept: [phone]  and follow the prompts.  Office hours are 8:00 a.m. to 4:30 p.m. Monday - Friday. Please note that voicemails left after 4:00 p.m. may not be returned until the following business day.  We are closed weekends and major holidays. You have access to a nurse at all times for urgent questions. Please call the main number to the clinic Dept: [phone] and follow the prompts.   For any non-urgent questions, you may also contact your provider using MyChart. We now offer e-Visits for anyone 48 and older to request care online for non-urgent symptoms. For details visit [email or web address].   Also download the MyChart app! Go to the app store, search "MyChart", open the app, select [facility], and log in with your MyChart username and password.  Due to Covid, a mask is required upon entering the hospital/clinic. If you do not have a mask, one will be given to you upon arrival. For doctor visits, patients may have 1 support person aged 41 or older with them. For treatment visits, patients cannot have anyone with them due to current Covid guidelines and our immunocompromised population.   Pembrolizumab injection What is this medication? PEMBROLIZUMAB (pem broe liz ue mab) is a monoclonal antibody. It is used totreat certain types of cancer. This medicine may be used for other purposes; ask your health care provider orpharmacist if you have questions. COMMON BRAND NAME(S): Keytruda What should I  tell my care team before I take this medication? They need to know if you have any of these conditions: autoimmune diseases like Crohn's disease, ulcerative colitis, or lupus have had or planning to have an allogeneic stem cell transplant (uses someone else's stem cells) history of organ  transplant history of chest radiation nervous system problems like myasthenia gravis or Guillain-Barre syndrome an unusual or allergic reaction to pembrolizumab, other medicines, foods, dyes, or preservatives pregnant or trying to get pregnant breast-feeding How should I use this medication? This medicine is for infusion into a vein. It is given by a health careprofessional in a hospital or clinic setting. A special MedGuide will be given to you before each treatment. Be sure to readthis information carefully each time. Talk to your pediatrician regarding the use of this medicine in children. While this drug may be prescribed for children as young as 6 months for selectedconditions, precautions do apply. Overdosage: If you think you have taken too much of this medicine contact apoison control center or emergency room at once. NOTE: This medicine is only for you. Do not share this medicine with others. What if I miss a dose? It is important not to miss your dose. Call your doctor or health careprofessional if you are unable to keep an appointment. What may interact with this medication? Interactions have not been studied. This list may not describe all possible interactions. Give your health care provider a list of all the medicines, herbs, non-prescription drugs, or dietary supplements you use. Also tell them if you smoke, drink alcohol, or use illegaldrugs. Some items may interact with your medicine. What should I watch for while using this medication? Your condition will be monitored carefully while you are receiving thismedicine. You may need blood work done while you are taking this medicine. Do not become pregnant while taking this medicine or for 4 months after stopping it. Women should inform their doctor if they wish to become pregnant or think they might be pregnant. There is a potential for serious side effects to an unborn child. Talk to your health care professional or pharmacist for  more information. Do not breast-feed an infant while taking this medicine orfor 4 months after the last dose. What side effects may I notice from receiving this medication? Side effects that you should report to your doctor or health care professionalas soon as possible: allergic reactions like skin rash, itching or hives, swelling of the face, lips, or tongue bloody or black, tarry breathing problems changes in vision chest pain chills confusion constipation cough diarrhea dizziness or feeling faint or lightheaded fast or irregular heartbeat fever flushing joint pain low blood counts - this medicine may decrease the number of white blood cells, red blood cells and platelets. You may be at increased risk for infections and bleeding. muscle pain muscle weakness pain, tingling, numbness in the hands or feet persistent headache redness, blistering, peeling or loosening of the skin, including inside the mouth signs and symptoms of high blood sugar such as dizziness; dry mouth; dry skin; fruity breath; nausea; stomach pain; increased hunger or thirst; increased urination signs and symptoms of kidney injury like trouble passing urine or change in the amount of urine signs and symptoms of liver injury like dark urine, light-colored stools, loss of appetite, nausea, right upper belly pain, yellowing of the eyes or skin sweating swollen lymph nodes weight loss Side effects that usually do not require medical attention (report to yourdoctor or health care professional if they continue  or are bothersome): decreased appetite hair loss tiredness This list may not describe all possible side effects. Call your doctor for medical advice about side effects. You may report side effects to FDA at1-800-FDA-1088. Where should I keep my medication? This drug is given in a hospital or clinic and will not be stored at home. NOTE: This sheet is a summary. It may not cover all possible information. If you  have questions about this medicine, talk to your doctor, pharmacist, orhealth care provider.  2022 Elsevier/Gold Standard (2019-03-25 21:44:53)

## 2020-12-16 NOTE — Progress Notes (Signed)
[facility] OFFICE PROGRESS NOTE   Diagnosis: Non-small cell lung cancer  INTERVAL HISTORY:   Brooke [name] returns as scheduled.  She completed another treatment with Pembrolizumab 11/25/2020.  She was seen in the emergency department on 12/08/2020 with shortness of breath.  The dyspnea was felt to be secondary to COPD exacerbation.  She received a dose of Solu-Medrol.  The dyspnea is better.  She denies nausea/vomiting.  No fever or rash.  She complains of a sore throat on the left side.  She has a sore throat intermittently.  She wonders if this is related to her "eustachian tube".  Objective:  Vital signs in last 24 hours:  Blood pressure 125/67, pulse 82, temperature 97.7 F (36.5 C), temperature source Oral, resp. rate 18, height _0  (1.575 m), weight 92 lb 9.6 oz (42 kg), SpO2 98 %.    HEENT: No thrush or ulcers. Resp: Distant breath sounds.  No wheezes.  No respiratory distress. Cardio: Regular rate and rhythm. GI: Abdomen soft and nontender.  No hepatosplenomegaly. Vascular: No leg edema. Skin: No rash.   Lab Results:  Lab Results  Component Value Date   WBC 5.6 11/25/2020   HGB 12.3 11/25/2020   HCT 38.4 11/25/2020   MCV 90.6 11/25/2020   PLT 208 11/25/2020   NEUTROABS 4.2 11/25/2020    Imaging:  No results found.  Medications: I have reviewed the patient's current medications.  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% Left lung radiation 11/08/2016 through 11/21/2016 CT chest 12/12/2016-reexpansion of left upper lobe  with a decreased left upper lobe mass, unchanged mediastinal adenopathy, progression of a splenic metastasis/pancreatic tail/gastrohepatic ligament metastasis, right lower lobe pneumonia Cycle 1 Pembrolizumab 12/14/2016 Cycle 2 Pembrolizumab 01/03/2017 Cycle 3 Pembrolizumab 01/24/2017 Cycle 4 Pembrolizumab 02/12/2017 CT 03/05/2018-decrease in left upper lobe mass, mediastinal adenopathy, and splenic mass Cycle 5 pembrolizumab 03/07/2017 Cycle 6 Pembrolizumab 04/02/2017 Cycle 7 pembrolizumab 04/25/2017 Cycle 8 Pembrolizumab 05/14/2017 Cycle 9 Pembrolizumab 06/06/2017 CT 06/20/2017-mild decrease in left upper lobe mass, mild increase in paramediastinal left upper lobe nodule-radiation change?, decreased splenic metastasis Cycle 10 pembrolizumab 06/25/2017 Cycle 11 pembrolizumab 07/18/2017 Cycle 12 pembrolizumab 08/29/2017 Cycle 13 pembrolizumab 09/17/2017 Cycle 14 Pembrolizumab 10/10/2017 CT chest 10/24/2017- slight enlargement of small mediastinal lymph nodes, increased left upper lobe consolidation, decreased size of spleen lesion Cycle 15 pembrolizumab 10/29/2017 Cycle 16 Pembrolizumab 11/21/2017 Cycle 17 Pembrolizumab 12/11/2017 Cycle 18 pembrolizumab 01/02/2018 Cycle 19 Pembrolizumab 01/21/2018 Cycle 20 Pembrolizumab 02/13/2018 CT chest 02/27/2018- decreased left paratracheal node, progressive consolidation/fibrosis in the left upper lung, decreased size of splenic mass Cycle 21 pembrolizumab 03/04/2018 Cycle 22 Pembrolizumab 03/27/2018  Cycle 23 pembrolizumab 04/15/2018 Cycle 24 pembrolizumab 05/08/2018 Cycle 25 Pembrolizumab 05/27/2018 CT chest 06/16/2018- stable left upper lung radiation fibrosis with no evidence of local tumor recurrence.  Decreased left paratracheal adenopathy.  No new or progressive metastatic disease in the chest.  Gastrohepatic ligament lymphadenopathy stable.  Splenic metastasis decreased.  T5 vertebral compression fracture with associated patchy sclerosis and no discrete  osseous lesion, new in the interval. Cycle 26 Pembrolizumab 06/19/2018 Cycle 27 pembrolizumab 07/08/2018 Cycle 28 pembrolizumab 07/31/2018 Cycle 29 pembrolizumab 08/19/2018 Cycle 30 Pembrolizumab 09/11/2018 Cycle 31 pembrolizumab 09/30/2018  Cycle 32 pembrolizumab 10/23/2018 CT chest 10/28/2018- left apical pleural-parenchymal opacity consistent with radiation scarring has become more confluent likely representing evolutionary change.  Continued further decrease in size of index left paratracheal node and splenic metastasis.  Gastrohepatic ligament lymph node not changed. Cycle 33 Pembrolizumab 11/11/2018 Cycle 34 pembrolizumab 12/04/2018 Cycle 35 pembrolizumab 12/23/2018 CT chest 02/12/2019- posttreatment changes left upper lobe unchanged.  Continued decrease in size of splenic metastasis.  Stable appearance of left paratracheal lymph nodes.  Stable gastrohepatic adenopathy.  Incomplete visualization of retroperitoneal lymph nodes in the upper abdomen. CT chest 07/02/2019-stable post treatment related changes left lung.  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  Slight regression of enlarged likely metastatic gastrohepatic lymph node. CT chest 11/11/2019-interval enlargement of a right paratracheal lymph node measuring 14 mm, increased from 5 mm.  No new lung mass or nodularity.  New adenopathy in the upper abdomen.  2.7 cm lesion gastrohepatic ligament.  Necrotic node at the splenic hilum measures 4.7 cm.  Large node along the IVC left upper quadrant measures 2.7 cm.  Similar node left adjacent the aorta measures 1.8 cm.  Enhancing lesion in the spleen is unchanged.  Several low-density lesions in the liver are unchanged. Pembrolizumab resumed on a 3-week schedule 12/03/2019 CT chest 02/22/2020-interval resolution of previously demonstrated left paratracheal and upper abdominal adenopathy.  Stable treated metastasis within the spleen.  Stable radiation changes in the left lung with left hilar distortion.  No evidence  of local recurrence or progressive metastatic disease. Continuation of pembrolizumab every 3 weeks 02/26/2020 CT chest 06/28/2020-no change in left upper lobe consolidation, unchanged spleen metastasis, no evidence of progressive disease Pembrolizumab continued every 3 weeks 07/01/2020 CT chest 11/02/2020-stable posttreatment changes at the left hilum and left upper lobe, decreased size of spleen lesion, no evidence of disease progression Pembrolizumab every 3 weeks continued     2.  05/29/2019 laryngoscopy-exophytic appearing mass mid right vocal cord.   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  Tumor cells negative for p16, CMV and HSV 2.  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 Radiation 07/29/2019-08/25/2019   3. History of acute respiratory failure secondary to #1   4. Oxygen dependent COPD   5. History of a NSTEMI 2015   6. Right lung pneumonia on chest CT 12/12/2016-treated with Levaquin   7.  Grade 2 rash possibly related to Pembrolizumab.  Treatment held 08/06/2017.  Improved 08/29/2017, treatment resumed, rash has resolved   8.  Vertigo January 2022-improved with course of Augmentin for ear/sinus infection      Disposition: Ms. Donner appears stable.  There is no clinical evidence of disease progression.  Plan to continue Pembrolizumab every 3 weeks.  Chemistry panel reviewed.  Labs adequate to proceed with treatment.  She will return for lab, follow-up, Pembrolizumab in 3 weeks.    [name] ANP/GNP-BC   12/16/2020  9:13 AM

## 2021-01-01 ENCOUNTER — Other Ambulatory Visit: Payer: Self-pay | Admitting: Oncology

## 2021-01-05 NOTE — Progress Notes (Signed)
[facility] OFFICE PROGRESS NOTE   Diagnosis: Non-small cell lung cancer  INTERVAL HISTORY:   Brooke [name] completed another treat with pembrolizumab on 12/16/2020.  No rash or diarrhea.  She reports malaise.  Good appetite.  She has intermittent vertigo.  Objective:  Vital signs in last 24 hours:  Blood pressure 126/77, pulse 80, temperature 98.1 F (36.7 C), temperature source Oral, resp. rate 20, height '5\' 2"'  (1.575 m), weight 96 lb 3.2 oz (43.6 kg), SpO2 98 %.   Lymphatics: No cervical or supraclavicular nodes Resp: Lungs clear bilaterally Cardio: Regular rate and rhythm GI: No hepatosplenomegaly Vascular: No leg edema  Skin: No rash  Portacath/PICC-without erythema  Lab Results:  Lab Results  Component Value Date   WBC 4.0 01/06/2021   HGB 12.9 01/06/2021   HCT 39.2 01/06/2021   MCV 89.1 01/06/2021   PLT 213 01/06/2021   NEUTROABS 2.6 01/06/2021    CMP  Lab Results  Component Value Date   NA 136 01/06/2021   K 4.1 01/06/2021   CL 102 01/06/2021   CO2 26 01/06/2021   GLUCOSE 107 (H) 01/06/2021   BUN 13 01/06/2021   CREATININE 0.45 01/06/2021   CALCIUM 9.1 01/06/2021   PROT 6.7 01/06/2021   ALBUMIN 3.8 01/06/2021   AST 16 01/06/2021   ALT 16 01/06/2021   ALKPHOS 51 01/06/2021   BILITOT 0.5 01/06/2021   GFRNONAA >60 01/06/2021   GFRAA >60 02/05/2020    No results found for: CEA1, CEA, NTZ001, CA125   Medications: I have reviewed the patient's current medications.   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% Left  lung radiation 11/08/2016 through 11/21/2016 CT chest 12/12/2016-reexpansion of left upper lobe with a decreased left upper lobe mass, unchanged mediastinal adenopathy, progression of a splenic metastasis/pancreatic tail/gastrohepatic ligament metastasis, right lower lobe pneumonia Cycle 1 Pembrolizumab 12/14/2016 Cycle 2 Pembrolizumab 01/03/2017 Cycle 3 Pembrolizumab 01/24/2017 Cycle 4 Pembrolizumab 02/12/2017 CT 03/05/2018-decrease in left upper lobe mass, mediastinal adenopathy, and splenic mass Cycle 5 pembrolizumab 03/07/2017 Cycle 6 Pembrolizumab 04/02/2017 Cycle 7 pembrolizumab 04/25/2017 Cycle 8 Pembrolizumab 05/14/2017 Cycle 9 Pembrolizumab 06/06/2017 CT 06/20/2017-mild decrease in left upper lobe mass, mild increase in paramediastinal left upper lobe nodule-radiation change?, decreased splenic metastasis Cycle 10 pembrolizumab 06/25/2017 Cycle 11 pembrolizumab 07/18/2017 Cycle 12 pembrolizumab 08/29/2017 Cycle 13 pembrolizumab 09/17/2017 Cycle 14 Pembrolizumab 10/10/2017 CT chest 10/24/2017- slight enlargement of small mediastinal lymph nodes, increased left upper lobe consolidation, decreased size of spleen lesion Cycle 15 pembrolizumab 10/29/2017 Cycle 16 Pembrolizumab 11/21/2017 Cycle 17 Pembrolizumab 12/11/2017 Cycle 18 pembrolizumab 01/02/2018 Cycle 19 Pembrolizumab 01/21/2018 Cycle 20 Pembrolizumab 02/13/2018 CT chest 02/27/2018- decreased left paratracheal node, progressive consolidation/fibrosis in the left upper lung, decreased size of splenic mass Cycle 21 pembrolizumab 03/04/2018 Cycle 22 Pembrolizumab 03/27/2018  Cycle 23 pembrolizumab 04/15/2018 Cycle 24 pembrolizumab 05/08/2018 Cycle 25 Pembrolizumab 05/27/2018 CT chest 06/16/2018- stable left upper lung radiation fibrosis with no evidence of local tumor recurrence.  Decreased left paratracheal adenopathy.  No new or progressive metastatic disease in the chest.  Gastrohepatic ligament lymphadenopathy stable.  Splenic metastasis  decreased.  T5 vertebral compression fracture with associated patchy sclerosis and no discrete osseous lesion, new in  the interval. Cycle 26 Pembrolizumab 06/19/2018 Cycle 27 pembrolizumab 07/08/2018 Cycle 28 pembrolizumab 07/31/2018 Cycle 29 pembrolizumab 08/19/2018 Cycle 30 Pembrolizumab 09/11/2018 Cycle 31 pembrolizumab 09/30/2018 Cycle 32 pembrolizumab 10/23/2018 CT chest 10/28/2018- left apical pleural-parenchymal opacity consistent with radiation scarring has become more confluent likely representing evolutionary change.  Continued further decrease in size of index left paratracheal node and splenic metastasis.  Gastrohepatic ligament lymph node not changed. Cycle 33 Pembrolizumab 11/11/2018 Cycle 34 pembrolizumab 12/04/2018 Cycle 35 pembrolizumab 12/23/2018 CT chest 02/12/2019- posttreatment changes left upper lobe unchanged.  Continued decrease in size of splenic metastasis.  Stable appearance of left paratracheal lymph nodes.  Stable gastrohepatic adenopathy.  Incomplete visualization of retroperitoneal lymph nodes in the upper abdomen. CT chest 07/02/2019-stable post treatment related changes left lung.  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  Slight regression of enlarged likely metastatic gastrohepatic lymph node. CT chest 11/11/2019-interval enlargement of a right paratracheal lymph node measuring 14 mm, increased from 5 mm.  No new lung mass or nodularity.  New adenopathy in the upper abdomen.  2.7 cm lesion gastrohepatic ligament.  Necrotic node at the splenic hilum measures 4.7 cm.  Large node along the IVC left upper quadrant measures 2.7 cm.  Similar node left adjacent the aorta measures 1.8 cm.  Enhancing lesion in the spleen is unchanged.  Several low-density lesions in the liver are unchanged. Pembrolizumab resumed on a 3-week schedule 12/03/2019 CT chest 02/22/2020-interval resolution of previously demonstrated left paratracheal and upper abdominal adenopathy.  Stable treated metastasis within  the spleen.  Stable radiation changes in the left lung with left hilar distortion.  No evidence of local recurrence or progressive metastatic disease. Continuation of pembrolizumab every 3 weeks 02/26/2020 CT chest 06/28/2020-no change in left upper lobe consolidation, unchanged spleen metastasis, no evidence of progressive disease Pembrolizumab continued every 3 weeks 07/01/2020 CT chest 11/02/2020-stable posttreatment changes at the left hilum and left upper lobe, decreased size of spleen lesion, no evidence of disease progression Pembrolizumab every 3 weeks continued     2.  05/29/2019 laryngoscopy-exophytic appearing mass mid right vocal cord.   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  Tumor cells negative for p16, CMV and HSV 2.  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 Radiation 07/29/2019-08/25/2019   3. History of acute respiratory failure secondary to #1   4. Oxygen dependent COPD   5. History of a NSTEMI 2015   6. Right lung pneumonia on chest CT 12/12/2016-treated with Levaquin   7.  Grade 2 rash possibly related to Pembrolizumab.  Treatment held 08/06/2017.  Improved 08/29/2017, treatment resumed, rash has resolved   8.  Vertigo January 2022-improved with course of Augmentin for ear/sinus infection     Disposition: Brooke [name] appears stable.  She will complete another treatment with pembrolizumab today.  She will return for an office visit and pembrolizumab in 3 weeks.  We will follow-up on the TSH from today.  Betsy Coder, MD  01/06/2021  10:14 AM

## 2021-01-06 ENCOUNTER — Inpatient Hospital Stay: Payer: Medicare Other

## 2021-01-06 ENCOUNTER — Inpatient Hospital Stay: Payer: Medicare Other | Attending: Oncology

## 2021-01-06 ENCOUNTER — Inpatient Hospital Stay (HOSPITAL_BASED_OUTPATIENT_CLINIC_OR_DEPARTMENT_OTHER): Payer: Medicare Other | Admitting: Oncology

## 2021-01-06 ENCOUNTER — Other Ambulatory Visit: Payer: Self-pay

## 2021-01-06 VITALS — BP 126/77 | HR 80 | Temp 98.1°F | Resp 20 | Ht 62.0 in | Wt 96.2 lb

## 2021-01-06 DIAGNOSIS — J440 Chronic obstructive pulmonary disease with acute lower respiratory infection: Secondary | ICD-10-CM | POA: Insufficient documentation

## 2021-01-06 DIAGNOSIS — C3412 Malignant neoplasm of upper lobe, left bronchus or lung: Secondary | ICD-10-CM

## 2021-01-06 DIAGNOSIS — I2699 Other pulmonary embolism without acute cor pulmonale: Secondary | ICD-10-CM | POA: Insufficient documentation

## 2021-01-06 DIAGNOSIS — C7889 Secondary malignant neoplasm of other digestive organs: Secondary | ICD-10-CM | POA: Diagnosis not present

## 2021-01-06 DIAGNOSIS — T451X5A Adverse effect of antineoplastic and immunosuppressive drugs, initial encounter: Secondary | ICD-10-CM | POA: Insufficient documentation

## 2021-01-06 DIAGNOSIS — Z79899 Other long term (current) drug therapy: Secondary | ICD-10-CM | POA: Diagnosis not present

## 2021-01-06 DIAGNOSIS — R21 Rash and other nonspecific skin eruption: Secondary | ICD-10-CM | POA: Diagnosis not present

## 2021-01-06 DIAGNOSIS — Z5112 Encounter for antineoplastic immunotherapy: Secondary | ICD-10-CM | POA: Diagnosis present

## 2021-01-06 DIAGNOSIS — Z9981 Dependence on supplemental oxygen: Secondary | ICD-10-CM | POA: Insufficient documentation

## 2021-01-06 DIAGNOSIS — I252 Old myocardial infarction: Secondary | ICD-10-CM | POA: Diagnosis not present

## 2021-01-06 LAB — CBC WITH DIFFERENTIAL (CANCER CENTER ONLY)
Abs Immature Granulocytes: 0 10*3/uL (ref 0.00–0.07)
Basophils Absolute: 0 10*3/uL (ref 0.0–0.1)
Basophils Relative: 0 %
Eosinophils Absolute: 0.1 10*3/uL (ref 0.0–0.5)
Eosinophils Relative: 3 %
HCT: 39.2 % (ref 36.0–46.0)
Hemoglobin: 12.9 g/dL (ref 12.0–15.0)
Immature Granulocytes: 0 %
Lymphocytes Relative: 24 %
Lymphs Abs: 1 10*3/uL (ref 0.7–4.0)
MCH: 29.3 pg (ref 26.0–34.0)
MCHC: 32.9 g/dL (ref 30.0–36.0)
MCV: 89.1 fL (ref 80.0–100.0)
Monocytes Absolute: 0.3 10*3/uL (ref 0.1–1.0)
Monocytes Relative: 8 %
Neutro Abs: 2.6 10*3/uL (ref 1.7–7.7)
Neutrophils Relative %: 65 %
Platelet Count: 213 10*3/uL (ref 150–400)
RBC: 4.4 MIL/uL (ref 3.87–5.11)
RDW: 13.2 % (ref 11.5–15.5)
WBC Count: 4 10*3/uL (ref 4.0–10.5)
nRBC: 0 % (ref 0.0–0.2)

## 2021-01-06 LAB — CMP (CANCER CENTER ONLY)
ALT: 16 U/L (ref 0–44)
AST: 16 U/L (ref 15–41)
Albumin: 3.8 g/dL (ref 3.5–5.0)
Alkaline Phosphatase: 51 U/L (ref 38–126)
Anion gap: 8 (ref 5–15)
BUN: 13 mg/dL (ref 8–23)
CO2: 26 mmol/L (ref 22–32)
Calcium: 9.1 mg/dL (ref 8.9–10.3)
Chloride: 102 mmol/L (ref 98–111)
Creatinine: 0.45 mg/dL (ref 0.44–1.00)
GFR, Estimated: >60 mL/min (ref >60)
Glucose, Bld: 107 mg/dL — ABNORMAL HIGH (ref 70–99)
Potassium: 4.1 mmol/L (ref 3.5–5.1)
Sodium: 136 mmol/L (ref 135–145)
Total Bilirubin: 0.5 mg/dL (ref 0.3–1.2)
Total Protein: 6.7 g/dL (ref 6.5–8.1)

## 2021-01-06 LAB — TSH: TSH: 1.133 u[IU]/mL (ref 0.350–4.500)

## 2021-01-06 MED ORDER — SODIUM CHLORIDE 0.9 % IV SOLN
200.0000 mg | Freq: Once | INTRAVENOUS | Status: AC
  Administered 2021-01-06: 200 mg via INTRAVENOUS
  Filled 2021-01-06: qty 8

## 2021-01-06 MED ORDER — SODIUM CHLORIDE 0.9 % IV SOLN
Freq: Once | INTRAVENOUS | Status: AC
Start: 2021-01-06 — End: 2021-01-06

## 2021-01-06 NOTE — Patient Instructions (Signed)
Brooke [name]  Discharge Instructions: Thank you for choosing [facility] to provide your oncology and hematology care.   If you have a lab appointment with the [facility], please go directly to the [facility] and check in at the registration area.   Wear comfortable clothing and clothing appropriate for easy access to any Portacath or PICC line.   We strive to give you quality time with your provider. You may need to reschedule your appointment if you arrive late (15 or more minutes).  Arriving late affects you and other patients whose appointments are after yours.  Also, if you miss three or more appointments without notifying the office, you may be dismissed from the clinic at the provider's discretion.      For prescription refill requests, have your pharmacy contact our office and allow 72 hours for refills to be completed.    Today you received the following chemotherapy and/or immunotherapy agents Keytruda      To help prevent nausea and vomiting after your treatment, we encourage you to take your nausea medication as directed.  BELOW ARE SYMPTOMS THAT SHOULD BE REPORTED IMMEDIATELY: *FEVER GREATER THAN 100.4 F (38 C) OR HIGHER *CHILLS OR SWEATING *NAUSEA AND VOMITING THAT IS NOT CONTROLLED WITH YOUR NAUSEA MEDICATION *UNUSUAL SHORTNESS OF BREATH *UNUSUAL BRUISING OR BLEEDING *URINARY PROBLEMS (pain or burning when urinating, or frequent urination) *BOWEL PROBLEMS (unusual diarrhea, constipation, pain near the anus) TENDERNESS IN MOUTH AND THROAT WITH OR WITHOUT PRESENCE OF ULCERS (sore throat, sores in mouth, or a toothache) UNUSUAL RASH, SWELLING OR PAIN  UNUSUAL VAGINAL DISCHARGE OR ITCHING   Items with * indicate a potential emergency and should be followed up as soon as possible or go to the Emergency Department if any problems should occur.  Please show the CHEMOTHERAPY ALERT CARD or IMMUNOTHERAPY ALERT CARD at check-in to the  Emergency Department and triage nurse.  Should you have questions after your visit or need to cancel or reschedule your appointment, please contact [facility]  Dept: [phone]  and follow the prompts.  Office hours are 8:00 a.m. to 4:30 p.m. Monday - Friday. Please note that voicemails left after 4:00 p.m. may not be returned until the following business day.  We are closed weekends and major holidays. You have access to a nurse at all times for urgent questions. Please call the main number to the clinic Dept: [phone] and follow the prompts.   For any non-urgent questions, you may also contact your provider using MyChart. We now offer e-Visits for anyone 14 and older to request care online for non-urgent symptoms. For details visit [email or web address].   Also download the MyChart app! Go to the app store, search "MyChart", open the app, select [facility], and log in with your MyChart username and password.  Due to Covid, a mask is required upon entering the hospital/clinic. If you do not have a mask, one will be given to you upon arrival. For doctor visits, patients may have 1 support person aged 56 or older with them. For treatment visits, patients cannot have anyone with them due to current Covid guidelines and our immunocompromised population.   Pembrolizumab injection What is this medication? PEMBROLIZUMAB (pem broe liz ue mab) is a monoclonal antibody. It is used to treat certain types of cancer. This medicine may be used for other purposes; ask your health care provider or pharmacist if you have questions. COMMON BRAND NAME(S): Keytruda What  should I tell my care team before I take this medication? They need to know if you have any of these conditions: autoimmune diseases like Crohn's disease, ulcerative colitis, or lupus have had or planning to have an allogeneic stem cell transplant (uses someone else's stem cells) history of organ  transplant history of chest radiation nervous system problems like myasthenia gravis or Guillain-Barre syndrome an unusual or allergic reaction to pembrolizumab, other medicines, foods, dyes, or preservatives pregnant or trying to get pregnant breast-feeding How should I use this medication? This medicine is for infusion into a vein. It is given by a health care professional in a hospital or clinic setting. A special MedGuide will be given to you before each treatment. Be sure to read this information carefully each time. Talk to your pediatrician regarding the use of this medicine in children. While this drug may be prescribed for children as young as 6 months for selected conditions, precautions do apply. Overdosage: If you think you have taken too much of this medicine contact a poison control center or emergency room at once. NOTE: This medicine is only for you. Do not share this medicine with others. What if I miss a dose? It is important not to miss your dose. Call your doctor or health care professional if you are unable to keep an appointment. What may interact with this medication? Interactions have not been studied. This list may not describe all possible interactions. Give your health care provider a list of all the medicines, herbs, non-prescription drugs, or dietary supplements you use. Also tell them if you smoke, drink alcohol, or use illegal drugs. Some items may interact with your medicine. What should I watch for while using this medication? Your condition will be monitored carefully while you are receiving this medicine. You may need blood work done while you are taking this medicine. Do not become pregnant while taking this medicine or for 4 months after stopping it. Women should inform their doctor if they wish to become pregnant or think they might be pregnant. There is a potential for serious side effects to an unborn child. Talk to your health care professional or  pharmacist for more information. Do not breast-feed an infant while taking this medicine or for 4 months after the last dose. What side effects may I notice from receiving this medication? Side effects that you should report to your doctor or health care professional as soon as possible: allergic reactions like skin rash, itching or hives, swelling of the face, lips, or tongue bloody or black, tarry breathing problems changes in vision chest pain chills confusion constipation cough diarrhea dizziness or feeling faint or lightheaded fast or irregular heartbeat fever flushing joint pain low blood counts - this medicine may decrease the number of white blood cells, red blood cells and platelets. You may be at increased risk for infections and bleeding. muscle pain muscle weakness pain, tingling, numbness in the hands or feet persistent headache redness, blistering, peeling or loosening of the skin, including inside the mouth signs and symptoms of high blood sugar such as dizziness; dry mouth; dry skin; fruity breath; nausea; stomach pain; increased hunger or thirst; increased urination signs and symptoms of kidney injury like trouble passing urine or change in the amount of urine signs and symptoms of liver injury like dark urine, light-colored stools, loss of appetite, nausea, right upper belly pain, yellowing of the eyes or skin sweating swollen lymph nodes weight loss Side effects that usually do not require medical  attention (report to your doctor or health care professional if they continue or are bothersome): decreased appetite hair loss tiredness This list may not describe all possible side effects. Call your doctor for medical advice about side effects. You may report side effects to FDA at 1-800-FDA-1088. Where should I keep my medication? This drug is given in a hospital or clinic and will not be stored at home. NOTE: This sheet is a summary. It may not cover all possible  information. If you have questions about this medicine, talk to your doctor, pharmacist, or health care provider.  2022 Elsevier/Gold Standard (2019-03-25 21:44:53)

## 2021-01-10 ENCOUNTER — Telehealth: Payer: Self-pay | Admitting: Internal Medicine

## 2021-01-10 MED ORDER — DOXYCYCLINE HYCLATE 100 MG PO TABS: 100.0000 mg | ORAL_TABLET | Freq: Two times a day (BID) | ORAL | 0 refills | Status: DC

## 2021-01-10 NOTE — Telephone Encounter (Signed)
Please offer doxycycline 100 mg, # 14, 1 twice daily  I see she avoids oral steroids. If she doesn't improve quickly, let us know or go back to UC, ED or [facility]

## 2021-01-10 NOTE — Telephone Encounter (Signed)
Primary Pulmonologist: Wert Last office visit and with whom: 09/12/2020 Wert What do we see them for (pulmonary problems): COPD, chronic respiratory failure with hypoxia Last OV assessment/plan:    Assessment & Plan Note by Tanda Rockers, MD at 09/12/2020 10:52 AM  Author: Tanda Rockers, MD Author Type: Physician Filed: 09/12/2020 10:52 AM  Note Status: Written Cosign: Cosign Not Required Encounter Date: 09/12/2020  Problem: Chronic respiratory failure with hypoxia ([place])  Editor: Tanda Rockers, MD (Physician)               rx 2lpm hs and prn daytime since 2014  -  06/13/2018   Walked on 2lpm x  3 laps @  approx 233ft each @ nl pace  stopped due to  Sob after each lap  s desat       rec as of 09/12/2020  = 2lpm hs and prn with activity with goal of keeping sats > 90%            Each maintenance medication was reviewed in detail including emphasizing most importantly the difference between maintenance and prns and under what circumstances the prns are to be triggered using an action plan format where appropriate.   Total time for H and P, chart review, counseling, reviewing 02/hfa  device(s) and generating customized AVS unique to this office visit / same day charting = 25 min              Patient Instructions by Tanda Rockers, MD at 09/12/2020 10:15 AM  Author: Tanda Rockers, MD Author Type: Physician Filed: 09/12/2020 10:43 AM  Note Status: Signed Cosign: Cosign Not Required Encounter Date: 09/12/2020  Editor: Tanda Rockers, MD (Physician)               Depomedrol 120 mg IM today   No change other  medications   Make sure you check your oxygen saturation  at your highest level of activity  to be sure it stays over 90% and adjust  02 flow upward to maintain this level if needed but remember to turn it back to previous settings when you stop (to conserve your supply).      Please schedule a follow up visit in 6 months but call sooner if needed        Orthostatic Vitals  Recorded in This Encounter   09/12/2020  1029     BP Location: Left Arm  Cuff Size: Normal   Instructions  Depomedrol 120 mg IM today   No change other  medications   Make sure you check your oxygen saturation  at your highest level of activity  to be sure it stays over 90% and adjust  02 flow upward to maintain this level if needed but remember to turn it back to previous settings when you stop (to conserve your supply).      Please schedule a follow up visit in 6 months but call sooner if needed        Reason for call: Pt. States she is having a COPD flare up/exacerbation.  Having symptoms since 01/07/21.  Having some coughing with lots of mucous that is white and frothy.  More mucous than normal.  Has used her rescue inhaler and duo neb wi th no relief.  She is using musinex has thinned it some.  On the left side of back feels congested.  Chest hurts some.  Denies any pain.  Denies any fever, chills or body aches.  She  is wheezing as well, the duoneb helps for a while, but does not help like it usually does.  She is using 2L of oxygen, oxygen levels are  99% sitting on 2l.  She was seen at [facility] on 12/08/20 for a COPD flare up and does not feel like she really got over it.  Has had covid vaccines.  No sick contacts.    Dr. Annamaria Boots, please advise.  Thank you.   (examples of things to ask: : When did symptoms start? Fever? Cough? Productive? Color to sputum? More sputum than usual? Wheezing? Have you needed increased oxygen? Are you taking your respiratory medications? What over the counter measures have you tried?)  Allergies  Allergen Reactions   Afrin [Oxymetazoline] Anxiety   Codeine Nausea And Vomiting   Imdur [Isosorbide Dinitrate]     headache   Prednisone Other (See Comments)    Reaction:Abnormal behavior; cannot take in pill form but CAN tolerate the injection   Pulmicort [Budesonide]     "feeling of torture"    Immunization History  Administered Date(s) Administered    Influenza, High Dose Seasonal PF 02/08/2016   Influenza,inj,Quad PF,6+ Mos 02/08/2016   PFIZER Comirnaty(Gray Top)Covid-19 Tri-Sucrose Vaccine 01/15/2020   PFIZER(Purple Top)SARS-COV-2 Vaccination 07/07/2019, 08/05/2019, 01/15/2020   Pneumococcal Conjugate-13 02/05/2015   Pneumococcal Polysaccharide-23 02/04/2013

## 2021-01-10 NOTE — Telephone Encounter (Signed)
Called and spoke with patient, provided recommendations per Dr. Annamaria Boots.  She verbalized understanding.  Verified pharmacy and script sent.  Nothing further needed.

## 2021-01-12 ENCOUNTER — Other Ambulatory Visit: Payer: Self-pay

## 2021-01-12 ENCOUNTER — Emergency Department (HOSPITAL_BASED_OUTPATIENT_CLINIC_OR_DEPARTMENT_OTHER)
Admission: EM | Admit: 2021-01-12 | Discharge: 2021-01-12 | Disposition: A | Discharge status: Home / Self Care | Payer: Medicare Other | Attending: Emergency Medicine | Admitting: Emergency Medicine

## 2021-01-12 ENCOUNTER — Emergency Department (HOSPITAL_BASED_OUTPATIENT_CLINIC_OR_DEPARTMENT_OTHER): Payer: Medicare Other | Admitting: Radiology

## 2021-01-12 ENCOUNTER — Telehealth: Payer: Self-pay | Admitting: Internal Medicine

## 2021-01-12 ENCOUNTER — Encounter (HOSPITAL_BASED_OUTPATIENT_CLINIC_OR_DEPARTMENT_OTHER): Payer: Self-pay | Admitting: *Deleted

## 2021-01-12 ENCOUNTER — Emergency Department (HOSPITAL_BASED_OUTPATIENT_CLINIC_OR_DEPARTMENT_OTHER): Payer: Medicare Other

## 2021-01-12 DIAGNOSIS — J8410 Pulmonary fibrosis, unspecified: Secondary | ICD-10-CM | POA: Insufficient documentation

## 2021-01-12 DIAGNOSIS — Z7951 Long term (current) use of inhaled steroids: Secondary | ICD-10-CM | POA: Insufficient documentation

## 2021-01-12 DIAGNOSIS — Z87891 Personal history of nicotine dependence: Secondary | ICD-10-CM | POA: Insufficient documentation

## 2021-01-12 DIAGNOSIS — R0602 Shortness of breath: Secondary | ICD-10-CM | POA: Diagnosis present

## 2021-01-12 DIAGNOSIS — I10 Essential (primary) hypertension: Secondary | ICD-10-CM | POA: Insufficient documentation

## 2021-01-12 DIAGNOSIS — Z85118 Personal history of other malignant neoplasm of bronchus and lung: Secondary | ICD-10-CM | POA: Insufficient documentation

## 2021-01-12 DIAGNOSIS — Z79899 Other long term (current) drug therapy: Secondary | ICD-10-CM | POA: Insufficient documentation

## 2021-01-12 DIAGNOSIS — J449 Chronic obstructive pulmonary disease, unspecified: Secondary | ICD-10-CM | POA: Diagnosis not present

## 2021-01-12 LAB — BASIC METABOLIC PANEL
Anion gap: 9 (ref 5–15)
BUN: 9 mg/dL (ref 8–23)
CO2: 28 mmol/L (ref 22–32)
Calcium: 9.8 mg/dL (ref 8.9–10.3)
Chloride: 98 mmol/L (ref 98–111)
Creatinine, Ser: 0.48 mg/dL (ref 0.44–1.00)
GFR, Estimated: >60 mL/min (ref >60)
Glucose, Bld: 86 mg/dL (ref 70–99)
Potassium: 4.1 mmol/L (ref 3.5–5.1)
Sodium: 135 mmol/L (ref 135–145)

## 2021-01-12 LAB — CBC WITH DIFFERENTIAL/PLATELET
Abs Immature Granulocytes: 0.01 10*3/uL (ref 0.00–0.07)
Basophils Absolute: 0 10*3/uL (ref 0.0–0.1)
Basophils Relative: 0 %
Eosinophils Absolute: 0.1 10*3/uL (ref 0.0–0.5)
Eosinophils Relative: 1 %
HCT: 42.1 % (ref 36.0–46.0)
Hemoglobin: 13.5 g/dL (ref 12.0–15.0)
Immature Granulocytes: 0 %
Lymphocytes Relative: 13 %
Lymphs Abs: 0.8 10*3/uL (ref 0.7–4.0)
MCH: 28.8 pg (ref 26.0–34.0)
MCHC: 32.1 g/dL (ref 30.0–36.0)
MCV: 90 fL (ref 80.0–100.0)
Monocytes Absolute: 0.3 10*3/uL (ref 0.1–1.0)
Monocytes Relative: 5 %
Neutro Abs: 5.1 10*3/uL (ref 1.7–7.7)
Neutrophils Relative %: 81 %
Platelets: 215 10*3/uL (ref 150–400)
RBC: 4.68 MIL/uL (ref 3.87–5.11)
RDW: 13.4 % (ref 11.5–15.5)
WBC: 6.4 10*3/uL (ref 4.0–10.5)
nRBC: 0 % (ref 0.0–0.2)

## 2021-01-12 LAB — BRAIN NATRIURETIC PEPTIDE: B Natriuretic Peptide: 62.9 pg/mL (ref 0.0–100.0)

## 2021-01-12 LAB — TROPONIN I (HIGH SENSITIVITY): Troponin I (High Sensitivity): 3 ng/L (ref <18)

## 2021-01-12 MED ORDER — IPRATROPIUM-ALBUTEROL 0.5-2.5 (3) MG/3ML IN SOLN
3.0000 mL | Freq: Once | RESPIRATORY_TRACT | Status: AC
  Administered 2021-01-12: 3 mL via RESPIRATORY_TRACT
  Filled 2021-01-12: qty 3

## 2021-01-12 MED ORDER — METHYLPREDNISOLONE SODIUM SUCC 125 MG IJ SOLR
125.0000 mg | Freq: Once | INTRAMUSCULAR | Status: AC
  Administered 2021-01-12: 125 mg via INTRAVENOUS
  Filled 2021-01-12: qty 2

## 2021-01-12 MED ORDER — IOHEXOL 350 MG/ML SOLN
60.0000 mL | Freq: Once | INTRAVENOUS | Status: AC | PRN
  Administered 2021-01-12: 60 mL via INTRAVENOUS

## 2021-01-12 MED ORDER — ALBUTEROL SULFATE (2.5 MG/3ML) 0.083% IN NEBU: 2.5000 mg | INHALATION_SOLUTION | Freq: Four times a day (QID) | RESPIRATORY_TRACT | 12 refills | Status: DC | PRN

## 2021-01-12 NOTE — Telephone Encounter (Signed)
Agree with your rec (ED/ UC). Maybe her primary office can give her depomedrol injection?

## 2021-01-12 NOTE — Telephone Encounter (Signed)
Called and spoke with patient. She stated that she was prescribed doxy on 01/07/21 and is still not feeling any better. She is still SOB and wheezing non-stop. She has been using the duoneb and albuterol every 4-6 hours to help. These will help for a short period of time. She denied any fevers or chills.   She stated that she normally gets like this once the weather changes.   She wanted to know if there sending an order to [facility] for a depo injection would be possible. I advised her that we can not do that and she would need an OV. I did attempt to find an appt today or tomorrow but we do not have any openings.   She is aware that she may have to go to the ED at [facility] or UC.   Dr. Annamaria Boots, can you please advise? Thanks.

## 2021-01-12 NOTE — ED Provider Notes (Signed)
[facility] EMERGENCY DEPT Provider Note   CSN: [id number] Arrival date & time: 01/12/21  1103     History Chief Complaint  Patient presents with   Shortness of Breath    Brooke [name] is a 75 y.o. female.  Pt is a 75 yo female with pmh of malignant lung cancer with mets to the liver, spleen, and throat presenting for complaints of sob. Pt admits to worsening sob x 1.5 weeks. Admits to white froth-like sputum production that she attributes to her underlying copd. States she has had no relief with home inhalers or nebulizer treatments. Denies fevers, chills, or sick contacts. Denies chest pain. Denies previous dx of DVT/PE. Not on blood thinners.   The history is provided by the patient. No language interpreter was used.  Shortness of Breath Associated symptoms: cough   Associated symptoms: no abdominal pain, no chest pain, no ear pain, no fever, no rash, no sore throat and no vomiting       Past Medical History:  Diagnosis Date   Chronic respiratory failure ([place])    a. on home O2.   COPD (chronic obstructive pulmonary disease) ([place])    a. Home O2.   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[place]) 11/06/2016   Protein-calorie malnutrition, severe 11/02/2016   SOB (shortness of  breath)    Community acquired pneumonia of left upper lobe of lung 10/22/2016   COPD exacerbation ([place]) 10/22/2016   Hypokalemia 10/22/2016   Microcytic anemia 10/22/2016   Mass of upper lobe of left lung 02/15/2016   Chronic respiratory failure with hypoxia ([place]) 02/15/2016   GERD (gastroesophageal reflux disease)    Former tobacco use    HLD (hyperlipidemia) 06/08/2013   NSTEMI (non-ST elevated myocardial infarction) ([place]) 06/05/2013   COPD  GOLD IV  06/05/2013    Past Surgical History:  Procedure Laterality Date   LEFT HEART CATHETERIZATION WITH CORONARY ANGIOGRAM N/A 06/08/2013   Procedure: LEFT HEART CATHETERIZATION WITH CORONARY ANGIOGRAM;  Surgeon: Sanda Klein, MD;  Location: [facility] CATH LAB;  Service: Cardiovascular;  Laterality: N/A;     OB History   No obstetric history on file.     Family History  Problem Relation Age of Onset   Lung cancer Father    Heart attack Neg Hx    Stroke Neg Hx    Hypertension Neg Hx     Social History   Tobacco Use   Smoking status: Former    Packs/day: 1.00    Years: 30.00    Pack years: 30.00    Types: Cigarettes    Quit date: 05/08/2007    Years since quitting: 13.6   Smokeless tobacco: Never  Vaping Use   Vaping Use: Never used  Substance Use Topics   Alcohol use: Yes  Comment: rarely   Drug use: No    Home Medications Prior to Admission medications   Medication Sig Start Date End Date Taking? Authorizing Provider  acetaminophen (TYLENOL) 325 MG tablet Per bottle as needed    [provider]  albuterol (PROVENTIL HFA;VENTOLIN HFA) 108 (90 BASE) MCG/ACT inhaler Inhale 2 puffs into the lungs every 4 (four) hours as needed for wheezing or shortness of breath. **PLAN B**    [provider]  ALPRAZolam Duanne Moron) 0.25 MG tablet 1/2-1 twice daily as needed 01/21/17   [provider]  ALPRAZolam Duanne Moron) 0.25 MG tablet Take 0.5-1 tablets by mouth 2 (two) times daily as needed. 11/12/20   [provider]  amLODipine (NORVASC) 2.5 MG tablet Take by mouth based on top number of BP. 1 tab (2.5mg ) if BP 140-159, 2 tabs (5mg ) for 160-179, 3 tabs (7.5 mg) for BP >180 05/27/20   Skeet Latch, MD  atorvastatin (LIPITOR) 20 MG tablet TAKE 1 TABLET(20 MG) BY MOUTH DAILY AT 6 PM 08/12/20   Skeet Latch, MD  Budeson-Glycopyrrol-Formoterol (BREZTRI AEROSPHERE) 160-9-4.8 MCG/ACT AERO Inhale 2 puffs into the lungs every 12 (twelve) hours. 09/16/20   Tanda Rockers, MD  budesonide-formoterol (SYMBICORT) 160-4.5 MCG/ACT inhaler Inhale 2 puffs into the lungs 2 (two) times daily. 12/28/20   [provider]  doxycycline (VIBRA-TABS) 100 MG tablet Take 1 tablet (100 mg total) by mouth 2 (two) times daily. 01/10/21   Deneise Lever, MD  guaiFENesin (MUCINEX) 600 MG 12 hr tablet Take 600 mg by mouth 2 (two) times daily as needed for cough or to loosen phlegm.    [provider]  ibuprofen (ADVIL,MOTRIN) 600 MG tablet Take 600 mg by mouth every 6 (six) hours as needed.    [provider]  ipratropium-albuterol (DUONEB) 0.5-2.5 (3) MG/3ML SOLN Take 3 mLs by nebulization every 4 (four) hours as needed (wheezing/shortness of breath). **PLAN C**    [provider]  nitroGLYCERIN (NITROSTAT) 0.4 MG SL tablet ONE TABLET UNDER TONGUE AS NEEDED FOR CHEST PAIN EVERY 5 MINUTES FOR 3 DOSES Patient not taking: No sig reported 05/10/15   Darlin Coco, MD  NON FORMULARY [facility]  Onychomycosis Nail Lacquer -  Fluconazole 2%, Terbinafine 1% DMSO/undecylenic acid 25% Apply to affected nail once daily Qty. 120 gm 3 refills    [provider]  OXYGEN 2lpm 24/7    [provider]  Probiotic Product (PROBIOTIC ADVANCED PO) Take 1 tablet by mouth daily. 01/15/20   [provider]  Simethicone 180 MG CAPS Use as needed    [provider]  SYMBICORT 160-4.5 MCG/ACT inhaler Inhale 2 puffs into the lungs 2 (two) times daily. 12/28/20    [provider]    Allergies    Afrin [oxymetazoline], Codeine, Imdur [isosorbide dinitrate], Prednisone, and Pulmicort [budesonide]  Review of Systems   Review of Systems  Constitutional:  Negative for chills and fever.  HENT:  Negative for ear pain and sore throat.   Eyes:  Negative for pain and visual disturbance.  Respiratory:  Positive for cough and shortness of breath.   Cardiovascular:  Negative for chest pain and palpitations.  Gastrointestinal:  Negative for abdominal pain and vomiting.  Genitourinary:  Negative for dysuria and hematuria.  Musculoskeletal:  Negative for arthralgias and back pain.  Skin:  Negative for color change and rash.  Neurological:  Negative for seizures and syncope.  All other systems reviewed and are negative.  Physical Exam Updated Vital Signs BP Marland Kitchen)  195/100 (BP Location: Right Arm)   Pulse 76   Temp 98.2 F (36.8 C) (Oral)   Resp 13   Ht 5' 2.75" (1.594 m)   Wt 43.5 kg   SpO2 100%   BMI 17.14 kg/m   Physical Exam Vitals and nursing note reviewed.  Constitutional:      General: She is not in acute distress.    Appearance: She is well-developed.  HENT:     Head: Normocephalic and atraumatic.  Eyes:     Conjunctiva/sclera: Conjunctivae normal.  Cardiovascular:     Rate and Rhythm: Normal rate and regular rhythm.     Heart sounds: No murmur heard. Pulmonary:     Effort: Pulmonary effort is normal. No respiratory distress.     Breath sounds: Normal breath sounds.  Abdominal:     Palpations: Abdomen is soft.     Tenderness: There is no abdominal tenderness.  Musculoskeletal:     Cervical back: Neck supple.  Skin:    General: Skin is warm and dry.  Neurological:     Mental Status: She is alert.    ED Results / Procedures / Treatments   Labs (all labs ordered are listed, but only abnormal results are displayed) Labs Reviewed  CBC WITH DIFFERENTIAL/PLATELET  BASIC METABOLIC PANEL  BRAIN NATRIURETIC PEPTIDE  TROPONIN  I (HIGH SENSITIVITY)    EKG None  Radiology No results found.  Procedures Procedures   Medications Ordered in ED Medications - No data to display  ED Course  I have reviewed the triage vital signs and the nursing notes.  Pertinent labs & imaging results that were available during my care of the patient were reviewed by me and considered in my medical decision making (see chart for details).    MDM Rules/Calculators/A&P                          3:02 PM 75 yo female with pmh of malignant lung cancer with mets to the liver, spleen, and throat presenting for complaints of sob. Patient is Aox3, no acute distress, afebrile, with stable vitals. Physical exam demonstrates equal bilateral breath sounds with no adventitious lung sounds.   EKG stable with no ST segment elevation or depression. Troponin wnl. Stable electrolytes. Stable CXR. CTPE demonstrates:  "1. No CT evidence for acute pulmonary embolus. 2. Stable radiation fibrosis in the left upper lobe. No new orprogressive findings on today's study. 3. Aortic Atherosclerosis (ICD10-I70.0) and Emphysema (ICD10-J43.9)"  Patient's symptoms possibly secondary to copd exacerbation. Patient given nebulizer treatment, solumedrol, and has already been started on doxycycline by pcp. Patient in no distress and overall condition improved here in the ED. Prescriptions sent to pharmacy. Detailed discussions were had with the patient regarding current findings, and need for close f/u with PCP or on call doctor. The patient has been instructed to return immediately if the symptoms worsen in any way for re-evaluation. Patient verbalized understanding and is in agreement with current care plan. All questions answered prior to discharge.      Final Clinical Impression(s) / ED Diagnoses Final diagnoses:  Chronic obstructive pulmonary disease, unspecified COPD type ([place])  Pulmonary fibrosis ([place])  SOB (shortness of breath)    Rx / DC Orders ED  Discharge Orders     None        Lianne Cure, DO 88/41/66 [id number]

## 2021-01-12 NOTE — Telephone Encounter (Signed)
Called and spoke with patient. She verbalized understanding. She stated that she rather go to the ED at [facility] vs PCP.   Advised her to call us back if anything changes.   Nothing further needed at time of call.

## 2021-01-12 NOTE — ED Triage Notes (Signed)
Pt has a history of lung cancer, c/o [facility] which she feels is related to a COPD flair.

## 2021-01-22 ENCOUNTER — Other Ambulatory Visit: Payer: Self-pay | Admitting: Oncology

## 2021-01-27 ENCOUNTER — Inpatient Hospital Stay: Payer: Medicare Other

## 2021-01-27 ENCOUNTER — Other Ambulatory Visit: Payer: Self-pay | Admitting: Oncology

## 2021-01-27 ENCOUNTER — Other Ambulatory Visit: Payer: Self-pay

## 2021-01-27 ENCOUNTER — Inpatient Hospital Stay (HOSPITAL_BASED_OUTPATIENT_CLINIC_OR_DEPARTMENT_OTHER): Payer: Medicare Other | Admitting: Nurse Practitioner

## 2021-01-27 ENCOUNTER — Encounter: Payer: Self-pay | Admitting: Nurse Practitioner

## 2021-01-27 VITALS — BP 155/68 | HR 67 | Temp 98.2°F | Resp 20 | Ht 62.0 in | Wt 97.4 lb

## 2021-01-27 DIAGNOSIS — C3412 Malignant neoplasm of upper lobe, left bronchus or lung: Secondary | ICD-10-CM

## 2021-01-27 LAB — CMP (CANCER CENTER ONLY)
ALT: 14 U/L (ref 0–44)
AST: 18 U/L (ref 15–41)
Albumin: 4.2 g/dL (ref 3.5–5.0)
Alkaline Phosphatase: 46 U/L (ref 38–126)
Anion gap: 6 (ref 5–15)
BUN: 13 mg/dL (ref 8–23)
CO2: 29 mmol/L (ref 22–32)
Calcium: 9.6 mg/dL (ref 8.9–10.3)
Chloride: 103 mmol/L (ref 98–111)
Creatinine: 0.45 mg/dL (ref 0.44–1.00)
GFR, Estimated: >60 mL/min (ref >60)
Glucose, Bld: 94 mg/dL (ref 70–99)
Potassium: 3.9 mmol/L (ref 3.5–5.1)
Sodium: 138 mmol/L (ref 135–145)
Total Bilirubin: 0.5 mg/dL (ref 0.3–1.2)
Total Protein: 6.8 g/dL (ref 6.5–8.1)

## 2021-01-27 MED ORDER — SODIUM CHLORIDE 0.9 % IV SOLN: Freq: Once | INTRAVENOUS | Status: AC

## 2021-01-27 MED ORDER — SODIUM CHLORIDE 0.9 % IV SOLN
200.0000 mg | Freq: Once | INTRAVENOUS | Status: AC
  Administered 2021-01-27: 200 mg via INTRAVENOUS
  Filled 2021-01-27: qty 8

## 2021-01-27 NOTE — Patient Instructions (Signed)
[facility]  Discharge Instructions: Thank you for choosing [facility] to provide your oncology and hematology care.   If you have a lab appointment with the [facility], please go directly to the [facility] and check in at the registration area.   Wear comfortable clothing and clothing appropriate for easy access to any Portacath or PICC line.   We strive to give you quality time with your provider. You may need to reschedule your appointment if you arrive late (15 or more minutes).  Arriving late affects you and other patients whose appointments are after yours.  Also, if you miss three or more appointments without notifying the office, you may be dismissed from the clinic at the provider's discretion.      For prescription refill requests, have your pharmacy contact our office and allow 72 hours for refills to be completed.    Today you received the following chemotherapy and/or immunotherapy agents KEYTRUDA      To help prevent nausea and vomiting after your treatment, we encourage you to take your nausea medication as directed.  BELOW ARE SYMPTOMS THAT SHOULD BE REPORTED IMMEDIATELY: *FEVER GREATER THAN 100.4 F (38 C) OR HIGHER *CHILLS OR SWEATING *NAUSEA AND VOMITING THAT IS NOT CONTROLLED WITH YOUR NAUSEA MEDICATION *UNUSUAL SHORTNESS OF BREATH *UNUSUAL BRUISING OR BLEEDING *URINARY PROBLEMS (pain or burning when urinating, or frequent urination) *BOWEL PROBLEMS (unusual diarrhea, constipation, pain near the anus) TENDERNESS IN MOUTH AND THROAT WITH OR WITHOUT PRESENCE OF ULCERS (sore throat, sores in mouth, or a toothache) UNUSUAL RASH, SWELLING OR PAIN  UNUSUAL VAGINAL DISCHARGE OR ITCHING   Items with * indicate a potential emergency and should be followed up as soon as possible or go to the Emergency Department if any problems should occur.  Please show the CHEMOTHERAPY ALERT CARD or IMMUNOTHERAPY ALERT CARD at check-in to the  Emergency Department and triage nurse.  Should you have questions after your visit or need to cancel or reschedule your appointment, please contact [facility]  Dept: [phone]  and follow the prompts.  Office hours are 8:00 a.m. to 4:30 p.m. Monday - Friday. Please note that voicemails left after 4:00 p.m. may not be returned until the following business day.  We are closed weekends and major holidays. You have access to a nurse at all times for urgent questions. Please call the main number to the clinic Dept: [phone] and follow the prompts.   For any non-urgent questions, you may also contact your provider using MyChart. We now offer e-Visits for anyone 15 and older to request care online for non-urgent symptoms. For details visit [email or web address].   Also download the MyChart app! Go to the app store, search "MyChart", open the app, select [facility], and log in with your MyChart username and password.  Due to Covid, a mask is required upon entering the hospital/clinic. If you do not have a mask, one will be given to you upon arrival. For doctor visits, patients may have 1 support person aged 3 or older with them. For treatment visits, patients cannot have anyone with them due to current Covid guidelines and our immunocompromised population.   Pembrolizumab injection What is this medication? PEMBROLIZUMAB (pem broe liz ue mab) is a monoclonal antibody. It is used to treat certain types of cancer. This medicine may be used for other purposes; ask your health care provider or pharmacist if you have questions. COMMON BRAND NAME(S): Keytruda What  should I tell my care team before I take this medication? They need to know if you have any of these conditions: autoimmune diseases like Crohn's disease, ulcerative colitis, or lupus have had or planning to have an allogeneic stem cell transplant (uses someone else's stem cells) history of organ  transplant history of chest radiation nervous system problems like myasthenia gravis or Guillain-Barre syndrome an unusual or allergic reaction to pembrolizumab, other medicines, foods, dyes, or preservatives pregnant or trying to get pregnant breast-feeding How should I use this medication? This medicine is for infusion into a vein. It is given by a health care professional in a hospital or clinic setting. A special MedGuide will be given to you before each treatment. Be sure to read this information carefully each time. Talk to your pediatrician regarding the use of this medicine in children. While this drug may be prescribed for children as young as 6 months for selected conditions, precautions do apply. Overdosage: If you think you have taken too much of this medicine contact a poison control center or emergency room at once. NOTE: This medicine is only for you. Do not share this medicine with others. What if I miss a dose? It is important not to miss your dose. Call your doctor or health care professional if you are unable to keep an appointment. What may interact with this medication? Interactions have not been studied. This list may not describe all possible interactions. Give your health care provider a list of all the medicines, herbs, non-prescription drugs, or dietary supplements you use. Also tell them if you smoke, drink alcohol, or use illegal drugs. Some items may interact with your medicine. What should I watch for while using this medication? Your condition will be monitored carefully while you are receiving this medicine. You may need blood work done while you are taking this medicine. Do not become pregnant while taking this medicine or for 4 months after stopping it. Women should inform their doctor if they wish to become pregnant or think they might be pregnant. There is a potential for serious side effects to an unborn child. Talk to your health care professional or  pharmacist for more information. Do not breast-feed an infant while taking this medicine or for 4 months after the last dose. What side effects may I notice from receiving this medication? Side effects that you should report to your doctor or health care professional as soon as possible: allergic reactions like skin rash, itching or hives, swelling of the face, lips, or tongue bloody or black, tarry breathing problems changes in vision chest pain chills confusion constipation cough diarrhea dizziness or feeling faint or lightheaded fast or irregular heartbeat fever flushing joint pain low blood counts - this medicine may decrease the number of white blood cells, red blood cells and platelets. You may be at increased risk for infections and bleeding. muscle pain muscle weakness pain, tingling, numbness in the hands or feet persistent headache redness, blistering, peeling or loosening of the skin, including inside the mouth signs and symptoms of high blood sugar such as dizziness; dry mouth; dry skin; fruity breath; nausea; stomach pain; increased hunger or thirst; increased urination signs and symptoms of kidney injury like trouble passing urine or change in the amount of urine signs and symptoms of liver injury like dark urine, light-colored stools, loss of appetite, nausea, right upper belly pain, yellowing of the eyes or skin sweating swollen lymph nodes weight loss Side effects that usually do not require medical  attention (report to your doctor or health care professional if they continue or are bothersome): decreased appetite hair loss tiredness This list may not describe all possible side effects. Call your doctor for medical advice about side effects. You may report side effects to FDA at 1-800-FDA-1088. Where should I keep my medication? This drug is given in a hospital or clinic and will not be stored at home. NOTE: This sheet is a summary. It may not cover all possible  information. If you have questions about this medicine, talk to your doctor, pharmacist, or health care provider.  2022 Elsevier/Gold Standard (2019-03-25 21:44:53)

## 2021-01-27 NOTE — Progress Notes (Signed)
Patient presents for treatment. RN assessment completed along with the following:  Labs/vitals reviewed - Yes, and within treatment parameters.   Weight within 10% of previous measurement - Yes Oncology Treatment Attestation completed for current therapy- No, message sent to provider regarding need for treatment attestation completion. Informed consent completed and reflects current therapy/intent - Yes, on date 12/14/20             Provider progress note reviewed - Yes, today's provider note was reviewed. Treatment/Antibody/Supportive plan reviewed - "Yes, and there are no adjustments needed for today's treatment."} S&H and other orders reviewed - Yes, and there are no additional orders identified. Previous treatment date reviewed - Yes, and the appropriate amount of time has elapsed between treatments.   Patient to proceed with treatment.

## 2021-01-27 NOTE — Progress Notes (Signed)
[facility] OFFICE PROGRESS NOTE   Diagnosis: Non-small cell lung cancer  INTERVAL HISTORY:   Brooke [name] returns as scheduled.  She completed another cycle of Pembrolizumab 12/16/2020.  No diarrhea or rash.  She reports being seen in the emergency department recently for a COPD flare.  Breathing is better.  Objective:  Vital signs in last 24 hours:  Blood pressure (!) 155/68, pulse 67, temperature 98.2 F (36.8 C), temperature source Oral, resp. rate 20, height _0  (1.575 m), weight 97 lb 6.4 oz (44.2 kg), SpO2 100 %.    HEENT: No thrush or ulcers. Resp: Distant breath sounds with scattered wheezes.  No respiratory distress. Cardio: Regular rate and rhythm. GI: Abdomen soft and nontender.  No hepatosplenomegaly. Vascular: No leg edema. Skin: No rash.   Lab Results:  Lab Results  Component Value Date   WBC 6.4 01/12/2021   HGB 13.5 01/12/2021   HCT 42.1 01/12/2021   MCV 90.0 01/12/2021   PLT 215 01/12/2021   NEUTROABS 5.1 01/12/2021    Imaging:  No results found.  Medications: I have reviewed the patient's current medications.  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% Left lung radiation 11/08/2016 through 11/21/2016 CT chest 12/12/2016-reexpansion of left upper lobe with a decreased left upper lobe mass, unchanged mediastinal adenopathy, progression of a splenic metastasis/pancreatic tail/gastrohepatic ligament metastasis, right lower lobe pneumonia Cycle 1 Pembrolizumab 12/14/2016 Cycle 2 Pembrolizumab 01/03/2017 Cycle 3  Pembrolizumab 01/24/2017 Cycle 4 Pembrolizumab 02/12/2017 CT 03/05/2018-decrease in left upper lobe mass, mediastinal adenopathy, and splenic mass Cycle 5 pembrolizumab 03/07/2017 Cycle 6 Pembrolizumab 04/02/2017 Cycle 7 pembrolizumab 04/25/2017 Cycle 8 Pembrolizumab 05/14/2017 Cycle 9 Pembrolizumab 06/06/2017 CT 06/20/2017-mild decrease in left upper lobe mass, mild increase in paramediastinal left upper lobe nodule-radiation change?, decreased splenic metastasis Cycle 10 pembrolizumab 06/25/2017 Cycle 11 pembrolizumab 07/18/2017 Cycle 12 pembrolizumab 08/29/2017 Cycle 13 pembrolizumab 09/17/2017 Cycle 14 Pembrolizumab 10/10/2017 CT chest 10/24/2017- slight enlargement of small mediastinal lymph nodes, increased left upper lobe consolidation, decreased size of spleen lesion Cycle 15 pembrolizumab 10/29/2017 Cycle 16 Pembrolizumab 11/21/2017 Cycle 17 Pembrolizumab 12/11/2017 Cycle 18 pembrolizumab 01/02/2018 Cycle 19 Pembrolizumab 01/21/2018 Cycle 20 Pembrolizumab 02/13/2018 CT chest 02/27/2018- decreased left paratracheal node, progressive consolidation/fibrosis in the left upper lung, decreased size of splenic mass Cycle 21 pembrolizumab 03/04/2018 Cycle 22 Pembrolizumab 03/27/2018  Cycle 23 pembrolizumab 04/15/2018 Cycle 24 pembrolizumab 05/08/2018 Cycle 25 Pembrolizumab 05/27/2018 CT chest 06/16/2018- stable left upper lung radiation fibrosis with no evidence of local tumor recurrence.  Decreased left paratracheal adenopathy.  No new or progressive metastatic disease in the chest.  Gastrohepatic ligament lymphadenopathy stable.  Splenic metastasis decreased.  T5 vertebral compression fracture with associated patchy sclerosis and no discrete osseous lesion, new in the interval. Cycle 26 Pembrolizumab 06/19/2018 Cycle 27 pembrolizumab 07/08/2018 Cycle 28 pembrolizumab 07/31/2018 Cycle 29 pembrolizumab 08/19/2018 Cycle 30 Pembrolizumab 09/11/2018 Cycle 31 pembrolizumab 09/30/2018 Cycle 32 pembrolizumab  10/23/2018 CT chest 10/28/2018- left apical pleural-parenchymal opacity consistent with radiation scarring has become more confluent likely representing evolutionary change.  Continued further decrease in size of index left paratracheal node and splenic metastasis.  Gastrohepatic ligament lymph node not changed. Cycle 33 Pembrolizumab 11/11/2018 Cycle 34 pembrolizumab  12/04/2018 Cycle 35 pembrolizumab 12/23/2018 CT chest 02/12/2019- posttreatment changes left upper lobe unchanged.  Continued decrease in size of splenic metastasis.  Stable appearance of left paratracheal lymph nodes.  Stable gastrohepatic adenopathy.  Incomplete visualization of retroperitoneal lymph nodes in the upper abdomen. CT chest 07/02/2019-stable post treatment related changes left lung.  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  Slight regression of enlarged likely metastatic gastrohepatic lymph node. CT chest 11/11/2019-interval enlargement of a right paratracheal lymph node measuring 14 mm, increased from 5 mm.  No new lung mass or nodularity.  New adenopathy in the upper abdomen.  2.7 cm lesion gastrohepatic ligament.  Necrotic node at the splenic hilum measures 4.7 cm.  Large node along the IVC left upper quadrant measures 2.7 cm.  Similar node left adjacent the aorta measures 1.8 cm.  Enhancing lesion in the spleen is unchanged.  Several low-density lesions in the liver are unchanged. Pembrolizumab resumed on a 3-week schedule 12/03/2019 CT chest 02/22/2020-interval resolution of previously demonstrated left paratracheal and upper abdominal adenopathy.  Stable treated metastasis within the spleen.  Stable radiation changes in the left lung with left hilar distortion.  No evidence of local recurrence or progressive metastatic disease. Continuation of pembrolizumab every 3 weeks 02/26/2020 CT chest 06/28/2020-no change in left upper lobe consolidation, unchanged spleen metastasis, no evidence of progressive disease Pembrolizumab continued  every 3 weeks 07/01/2020 CT chest 11/02/2020-stable posttreatment changes at the left hilum and left upper lobe, decreased size of spleen lesion, no evidence of disease progression Pembrolizumab every 3 weeks continued CT chest 01/12/2021-negative for acute pulmonary embolus.  Stable radiation fibrosis left upper lobe.  Stable low-density lesion in the spleen.  No new or progressive findings. Pembrolizumab every 3 weeks continued     2.  05/29/2019 laryngoscopy-exophytic appearing mass mid right vocal cord.   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  Tumor cells negative for p16, CMV and HSV 2.  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 Radiation 07/29/2019-08/25/2019   3. History of acute respiratory failure secondary to #1   4. Oxygen dependent COPD   5. History of a NSTEMI 2015   6. Right lung pneumonia on chest CT 12/12/2016-treated with Levaquin   7.  Grade 2 rash possibly related to Pembrolizumab.  Treatment held 08/06/2017.  Improved 08/29/2017, treatment resumed, rash has resolved   8.  Vertigo January 2022-improved with course of Augmentin for ear/sinus infection      Disposition: Ms. Takaki appears stable.  She had an interim chest CT due to emergency department evaluation for dyspnea.  No evidence of disease progression.  She was treated for a COPD flare and is feeling better.  Plan to continue Pembrolizumab every 3 weeks.  Chemistry panel from today reviewed.  Labs adequate to proceed with Pembrolizumab.  She will return for lab, follow-up, Pembrolizumab in 3 weeks.    [name] ANP/GNP-BC   01/27/2021  9:56 AM

## 2021-02-02 ENCOUNTER — Other Ambulatory Visit: Payer: Self-pay | Admitting: Cardiovascular Disease

## 2021-02-03 NOTE — Telephone Encounter (Signed)
Rx(s) sent to pharmacy electronically.  

## 2021-02-12 ENCOUNTER — Other Ambulatory Visit: Payer: Self-pay | Admitting: Oncology

## 2021-02-17 ENCOUNTER — Inpatient Hospital Stay: Payer: Medicare Other | Attending: Oncology

## 2021-02-17 ENCOUNTER — Other Ambulatory Visit: Payer: Self-pay

## 2021-02-17 ENCOUNTER — Telehealth: Payer: Self-pay

## 2021-02-17 ENCOUNTER — Inpatient Hospital Stay (HOSPITAL_BASED_OUTPATIENT_CLINIC_OR_DEPARTMENT_OTHER): Payer: Medicare Other | Admitting: Oncology

## 2021-02-17 ENCOUNTER — Inpatient Hospital Stay: Payer: Medicare Other

## 2021-02-17 VITALS — BP 152/84 | HR 86 | Temp 97.8°F | Resp 18 | Ht 62.0 in | Wt 95.0 lb

## 2021-02-17 DIAGNOSIS — J440 Chronic obstructive pulmonary disease with acute lower respiratory infection: Secondary | ICD-10-CM | POA: Diagnosis not present

## 2021-02-17 DIAGNOSIS — C3412 Malignant neoplasm of upper lobe, left bronchus or lung: Secondary | ICD-10-CM | POA: Insufficient documentation

## 2021-02-17 DIAGNOSIS — Z86711 Personal history of pulmonary embolism: Secondary | ICD-10-CM | POA: Diagnosis not present

## 2021-02-17 DIAGNOSIS — I2699 Other pulmonary embolism without acute cor pulmonale: Secondary | ICD-10-CM | POA: Insufficient documentation

## 2021-02-17 DIAGNOSIS — I252 Old myocardial infarction: Secondary | ICD-10-CM | POA: Diagnosis not present

## 2021-02-17 DIAGNOSIS — Z9981 Dependence on supplemental oxygen: Secondary | ICD-10-CM | POA: Insufficient documentation

## 2021-02-17 DIAGNOSIS — C7889 Secondary malignant neoplasm of other digestive organs: Secondary | ICD-10-CM | POA: Diagnosis not present

## 2021-02-17 DIAGNOSIS — Z79899 Other long term (current) drug therapy: Secondary | ICD-10-CM | POA: Insufficient documentation

## 2021-02-17 DIAGNOSIS — Z5112 Encounter for antineoplastic immunotherapy: Secondary | ICD-10-CM | POA: Insufficient documentation

## 2021-02-17 LAB — CBC WITH DIFFERENTIAL (CANCER CENTER ONLY)
Abs Immature Granulocytes: 0.01 10*3/uL (ref 0.00–0.07)
Basophils Absolute: 0 10*3/uL (ref 0.0–0.1)
Basophils Relative: 0 %
Eosinophils Absolute: 0.1 10*3/uL (ref 0.0–0.5)
Eosinophils Relative: 3 %
HCT: 42.2 % (ref 36.0–46.0)
Hemoglobin: 13.6 g/dL (ref 12.0–15.0)
Immature Granulocytes: 0 %
Lymphocytes Relative: 15 %
Lymphs Abs: 0.8 10*3/uL (ref 0.7–4.0)
MCH: 28.9 pg (ref 26.0–34.0)
MCHC: 32.2 g/dL (ref 30.0–36.0)
MCV: 89.6 fL (ref 80.0–100.0)
Monocytes Absolute: 0.4 10*3/uL (ref 0.1–1.0)
Monocytes Relative: 7 %
Neutro Abs: 3.9 10*3/uL (ref 1.7–7.7)
Neutrophils Relative %: 75 %
Platelet Count: 203 10*3/uL (ref 150–400)
RBC: 4.71 MIL/uL (ref 3.87–5.11)
RDW: 13.1 % (ref 11.5–15.5)
WBC Count: 5.2 10*3/uL (ref 4.0–10.5)
nRBC: 0 % (ref 0.0–0.2)

## 2021-02-17 LAB — CMP (CANCER CENTER ONLY)
ALT: 13 U/L (ref 0–44)
AST: 17 U/L (ref 15–41)
Albumin: 4.3 g/dL (ref 3.5–5.0)
Alkaline Phosphatase: 57 U/L (ref 38–126)
Anion gap: 10 (ref 5–15)
BUN: 11 mg/dL (ref 8–23)
CO2: 27 mmol/L (ref 22–32)
Calcium: 9.4 mg/dL (ref 8.9–10.3)
Chloride: 100 mmol/L (ref 98–111)
Creatinine: 0.43 mg/dL — ABNORMAL LOW (ref 0.44–1.00)
GFR, Estimated: >60 mL/min (ref >60)
Glucose, Bld: 100 mg/dL — ABNORMAL HIGH (ref 70–99)
Potassium: 3.8 mmol/L (ref 3.5–5.1)
Sodium: 137 mmol/L (ref 135–145)
Total Bilirubin: 0.4 mg/dL (ref 0.3–1.2)
Total Protein: 7 g/dL (ref 6.5–8.1)

## 2021-02-17 LAB — TSH: TSH: 0.761 u[IU]/mL (ref 0.350–4.500)

## 2021-02-17 MED ORDER — SODIUM CHLORIDE 0.9 % IV SOLN: Freq: Once | INTRAVENOUS | Status: AC

## 2021-02-17 MED ORDER — SODIUM CHLORIDE 0.9 % IV SOLN
200.0000 mg | Freq: Once | INTRAVENOUS | Status: AC
  Administered 2021-02-17: 200 mg via INTRAVENOUS
  Filled 2021-02-17: qty 8

## 2021-02-17 NOTE — Patient Instructions (Signed)
[facility]  Discharge Instructions: Thank you for choosing [facility] to provide your oncology and hematology care.   If you have a lab appointment with the [facility], please go directly to the [facility] and check in at the registration area.   Wear comfortable clothing and clothing appropriate for easy access to any Portacath or PICC line.   We strive to give you quality time with your provider. You may need to reschedule your appointment if you arrive late (15 or more minutes).  Arriving late affects you and other patients whose appointments are after yours.  Also, if you miss three or more appointments without notifying the office, you may be dismissed from the clinic at the provider's discretion.      For prescription refill requests, have your pharmacy contact our office and allow 72 hours for refills to be completed.    Today you received the following chemotherapy and/or immunotherapy agents KEYTRUDA      To help prevent nausea and vomiting after your treatment, we encourage you to take your nausea medication as directed.  BELOW ARE SYMPTOMS THAT SHOULD BE REPORTED IMMEDIATELY: *FEVER GREATER THAN 100.4 F (38 C) OR HIGHER *CHILLS OR SWEATING *NAUSEA AND VOMITING THAT IS NOT CONTROLLED WITH YOUR NAUSEA MEDICATION *UNUSUAL SHORTNESS OF BREATH *UNUSUAL BRUISING OR BLEEDING *URINARY PROBLEMS (pain or burning when urinating, or frequent urination) *BOWEL PROBLEMS (unusual diarrhea, constipation, pain near the anus) TENDERNESS IN MOUTH AND THROAT WITH OR WITHOUT PRESENCE OF ULCERS (sore throat, sores in mouth, or a toothache) UNUSUAL RASH, SWELLING OR PAIN  UNUSUAL VAGINAL DISCHARGE OR ITCHING   Items with * indicate a potential emergency and should be followed up as soon as possible or go to the Emergency Department if any problems should occur.  Please show the CHEMOTHERAPY ALERT CARD or IMMUNOTHERAPY ALERT CARD at check-in to the  Emergency Department and triage nurse.  Should you have questions after your visit or need to cancel or reschedule your appointment, please contact [facility]  Dept: [phone]  and follow the prompts.  Office hours are 8:00 a.m. to 4:30 p.m. Monday - Friday. Please note that voicemails left after 4:00 p.m. may not be returned until the following business day.  We are closed weekends and major holidays. You have access to a nurse at all times for urgent questions. Please call the main number to the clinic Dept: [phone] and follow the prompts.   For any non-urgent questions, you may also contact your provider using MyChart. We now offer e-Visits for anyone 31 and older to request care online for non-urgent symptoms. For details visit [email or web address].   Also download the MyChart app! Go to the app store, search "MyChart", open the app, select [facility], and log in with your MyChart username and password.  Due to Covid, a mask is required upon entering the hospital/clinic. If you do not have a mask, one will be given to you upon arrival. For doctor visits, patients may have 1 support person aged 41 or older with them. For treatment visits, patients cannot have anyone with them due to current Covid guidelines and our immunocompromised population.   Pembrolizumab injection What is this medication? PEMBROLIZUMAB (pem broe liz ue mab) is a monoclonal antibody. It is used to treat certain types of cancer. This medicine may be used for other purposes; ask your health care provider or pharmacist if you have questions. COMMON BRAND NAME(S): Keytruda What  should I tell my care team before I take this medication? They need to know if you have any of these conditions: autoimmune diseases like Crohn's disease, ulcerative colitis, or lupus have had or planning to have an allogeneic stem cell transplant (uses someone else's stem cells) history of organ  transplant history of chest radiation nervous system problems like myasthenia gravis or Guillain-Barre syndrome an unusual or allergic reaction to pembrolizumab, other medicines, foods, dyes, or preservatives pregnant or trying to get pregnant breast-feeding How should I use this medication? This medicine is for infusion into a vein. It is given by a health care professional in a hospital or clinic setting. A special MedGuide will be given to you before each treatment. Be sure to read this information carefully each time. Talk to your pediatrician regarding the use of this medicine in children. While this drug may be prescribed for children as young as 6 months for selected conditions, precautions do apply. Overdosage: If you think you have taken too much of this medicine contact a poison control center or emergency room at once. NOTE: This medicine is only for you. Do not share this medicine with others. What if I miss a dose? It is important not to miss your dose. Call your doctor or health care professional if you are unable to keep an appointment. What may interact with this medication? Interactions have not been studied. This list may not describe all possible interactions. Give your health care provider a list of all the medicines, herbs, non-prescription drugs, or dietary supplements you use. Also tell them if you smoke, drink alcohol, or use illegal drugs. Some items may interact with your medicine. What should I watch for while using this medication? Your condition will be monitored carefully while you are receiving this medicine. You may need blood work done while you are taking this medicine. Do not become pregnant while taking this medicine or for 4 months after stopping it. Women should inform their doctor if they wish to become pregnant or think they might be pregnant. There is a potential for serious side effects to an unborn child. Talk to your health care professional or  pharmacist for more information. Do not breast-feed an infant while taking this medicine or for 4 months after the last dose. What side effects may I notice from receiving this medication? Side effects that you should report to your doctor or health care professional as soon as possible: allergic reactions like skin rash, itching or hives, swelling of the face, lips, or tongue bloody or black, tarry breathing problems changes in vision chest pain chills confusion constipation cough diarrhea dizziness or feeling faint or lightheaded fast or irregular heartbeat fever flushing joint pain low blood counts - this medicine may decrease the number of white blood cells, red blood cells and platelets. You may be at increased risk for infections and bleeding. muscle pain muscle weakness pain, tingling, numbness in the hands or feet persistent headache redness, blistering, peeling or loosening of the skin, including inside the mouth signs and symptoms of high blood sugar such as dizziness; dry mouth; dry skin; fruity breath; nausea; stomach pain; increased hunger or thirst; increased urination signs and symptoms of kidney injury like trouble passing urine or change in the amount of urine signs and symptoms of liver injury like dark urine, light-colored stools, loss of appetite, nausea, right upper belly pain, yellowing of the eyes or skin sweating swollen lymph nodes weight loss Side effects that usually do not require medical  attention (report to your doctor or health care professional if they continue or are bothersome): decreased appetite hair loss tiredness This list may not describe all possible side effects. Call your doctor for medical advice about side effects. You may report side effects to FDA at 1-800-FDA-1088. Where should I keep my medication? This drug is given in a hospital or clinic and will not be stored at home. NOTE: This sheet is a summary. It may not cover all possible  information. If you have questions about this medicine, talk to your doctor, pharmacist, or health care provider.  2022 Elsevier/Gold Standard (2019-03-25 21:44:53)

## 2021-02-17 NOTE — Progress Notes (Signed)
[place] OFFICE PROGRESS NOTE   Diagnosis: Non-small cell lung cancer  INTERVAL HISTORY:   Ms. Degroote completed another cycle of pembrolizumab on 01/27/2021.  No diarrhea.  She has a mild rash at the right upper anterior chest.  She reports increased dyspnea over the past month.  The dyspnea improved after she was given a steroid injection in the emergency room last month.  She continues nebulizer treatment.  She is no longer able to afford the breztri inhaler.  Objective:  Vital signs in last 24 hours:  Blood pressure (!) 152/84, pulse 86, temperature 97.8 F (36.6 C), temperature source Oral, resp. rate 18, height 5' 2" (1.575 m), weight 95 lb (43.1 kg), SpO2 95 %.    Resp: Lungs clear bilaterally, no respiratory distress Cardio: Regular rate and rhythm GI: No hepatosplenomegaly Vascular: No leg edema  Skin: Mild dryness at the right upper anterior chest  Lab Results:  Lab Results  Component Value Date   WBC 5.2 02/17/2021   HGB 13.6 02/17/2021   HCT 42.2 02/17/2021   MCV 89.6 02/17/2021   PLT 203 02/17/2021   NEUTROABS 3.9 02/17/2021    CMP  Lab Results  Component Value Date   NA 138 01/27/2021   K 3.9 01/27/2021   CL 103 01/27/2021   CO2 29 01/27/2021   GLUCOSE 94 01/27/2021   BUN 13 01/27/2021   CREATININE 0.45 01/27/2021   CALCIUM 9.6 01/27/2021   PROT 6.8 01/27/2021   ALBUMIN 4.2 01/27/2021   AST 18 01/27/2021   ALT 14 01/27/2021   ALKPHOS 46 01/27/2021   BILITOT 0.5 01/27/2021   GFRNONAA >60 01/27/2021   GFRAA >60 02/05/2020    No results found for: CEA1, CEA, CAN199, CA125  Lab Results  Component Value Date   INR 0.99 11/01/2016   LABPROT 13.1 11/01/2016    Imaging:  No results found.  Medications: I have reviewed the patient's current medications.   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% Left lung radiation 11/08/2016 through 11/21/2016 CT chest 12/12/2016-reexpansion of left upper lobe with a decreased left upper lobe mass, unchanged mediastinal adenopathy, progression of a splenic metastasis/pancreatic tail/gastrohepatic ligament metastasis, right lower lobe pneumonia Cycle 1 Pembrolizumab 12/14/2016 Cycle 2 Pembrolizumab 01/03/2017 Cycle 3 Pembrolizumab 01/24/2017 Cycle 4 Pembrolizumab 02/12/2017 CT 03/05/2018-decrease in left upper lobe mass, mediastinal adenopathy, and splenic mass Cycle 5 pembrolizumab 03/07/2017 Cycle 6 Pembrolizumab 04/02/2017 Cycle 7 pembrolizumab 04/25/2017 Cycle 8 Pembrolizumab 05/14/2017 Cycle 9 Pembrolizumab 06/06/2017 CT 06/20/2017-mild decrease in left upper lobe mass, mild increase in paramediastinal left upper lobe nodule-radiation change?, decreased splenic metastasis Cycle 10 pembrolizumab 06/25/2017 Cycle 11 pembrolizumab 07/18/2017 Cycle 12 pembrolizumab 08/29/2017 Cycle 13 pembrolizumab 09/17/2017 Cycle 14 Pembrolizumab 10/10/2017 CT chest 10/24/2017- slight enlargement of small mediastinal lymph nodes, increased left upper lobe consolidation, decreased size of spleen lesion Cycle 15 pembrolizumab 10/29/2017 Cycle 16 Pembrolizumab 11/21/2017 Cycle 17 Pembrolizumab 12/11/2017 Cycle 18 pembrolizumab 01/02/2018 Cycle 19 Pembrolizumab 01/21/2018 Cycle 20 Pembrolizumab 02/13/2018 CT chest 02/27/2018- decreased left paratracheal node, progressive consolidation/fibrosis in the left upper lung, decreased size of splenic mass Cycle 21 pembrolizumab 03/04/2018 Cycle 22 Pembrolizumab 03/27/2018  Cycle 23 pembrolizumab 04/15/2018 Cycle 24  pembrolizumab  05/08/2018 Cycle 25 Pembrolizumab 05/27/2018 CT chest 06/16/2018- stable left upper lung radiation fibrosis with no evidence of local tumor recurrence.  Decreased left paratracheal adenopathy.  No new or progressive metastatic disease in the chest.  Gastrohepatic ligament lymphadenopathy stable.  Splenic metastasis decreased.  T5 vertebral compression fracture with associated patchy sclerosis and no discrete osseous lesion, new in the interval. Cycle 26 Pembrolizumab 06/19/2018 Cycle 27 pembrolizumab 07/08/2018 Cycle 28 pembrolizumab 07/31/2018 Cycle 29 pembrolizumab 08/19/2018 Cycle 30 Pembrolizumab 09/11/2018 Cycle 31 pembrolizumab 09/30/2018 Cycle 32 pembrolizumab 10/23/2018 CT chest 10/28/2018- left apical pleural-parenchymal opacity consistent with radiation scarring has become more confluent likely representing evolutionary change.  Continued further decrease in size of index left paratracheal node and splenic metastasis.  Gastrohepatic ligament lymph node not changed. Cycle 33 Pembrolizumab 11/11/2018 Cycle 34 pembrolizumab 12/04/2018 Cycle 35 pembrolizumab 12/23/2018 CT chest 02/12/2019- posttreatment changes left upper lobe unchanged.  Continued decrease in size of splenic metastasis.  Stable appearance of left paratracheal lymph nodes.  Stable gastrohepatic adenopathy.  Incomplete visualization of retroperitoneal lymph nodes in the upper abdomen. CT chest 07/02/2019-stable post treatment related changes left lung.  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  Slight regression of enlarged likely metastatic gastrohepatic lymph node. CT chest 11/11/2019-interval enlargement of a right paratracheal lymph node measuring 14 mm, increased from 5 mm.  No new lung mass or nodularity.  New adenopathy in the upper abdomen.  2.7 cm lesion gastrohepatic ligament.  Necrotic node at the splenic hilum measures 4.7 cm.  Large node along the IVC left upper quadrant measures 2.7 cm.  Similar node left adjacent the aorta measures 1.8  cm.  Enhancing lesion in the spleen is unchanged.  Several low-density lesions in the liver are unchanged. Pembrolizumab resumed on a 3-week schedule 12/03/2019 CT chest 02/22/2020-interval resolution of previously demonstrated left paratracheal and upper abdominal adenopathy.  Stable treated metastasis within the spleen.  Stable radiation changes in the left lung with left hilar distortion.  No evidence of local recurrence or progressive metastatic disease. Continuation of pembrolizumab every 3 weeks 02/26/2020 CT chest 06/28/2020-no change in left upper lobe consolidation, unchanged spleen metastasis, no evidence of progressive disease Pembrolizumab continued every 3 weeks 07/01/2020 CT chest 11/02/2020-stable posttreatment changes at the left hilum and left upper lobe, decreased size of spleen lesion, no evidence of disease progression Pembrolizumab every 3 weeks continued CT chest 01/12/2021-negative for acute pulmonary embolus.  Stable radiation fibrosis left upper lobe.  Stable low-density lesion in the spleen.  No new or progressive findings. Pembrolizumab every 3 weeks continued     2.  05/29/2019 laryngoscopy-exophytic appearing mass mid right vocal cord.   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  Tumor cells negative for p16, CMV and HSV 2.  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 Radiation 07/29/2019-08/25/2019   3. History of acute respiratory failure secondary to #1   4. Oxygen dependent COPD   5. History of a NSTEMI 2015   6. Right lung pneumonia on chest CT 12/12/2016-treated with Levaquin   7.  Grade 2 rash possibly related to Pembrolizumab.  Treatment held 08/06/2017.  Improved 08/29/2017, treatment resumed, rash has resolved   8.  Vertigo January 2022-improved with course of Augmentin for ear/sinus infection       Disposition: Ms. Witter appears stable.  She will complete another treat with pembrolizumab today.  She will  return for an office visit and pembrolizumab in 3 weeks.  I encouraged her to follow-up with Dr.  Wert to evaluate the increased dyspnea.  Betsy Coder, MD  02/17/2021  10:46 AM

## 2021-02-17 NOTE — Progress Notes (Signed)
Patient presents for treatment. RN assessment completed along with the following:  Labs/vitals reviewed - Yes, and within treatment parameters.   Weight within 10% of previous measurement - Yes Oncology Treatment Attestation completed for current therapy- Yes, on date 01/27/21 Informed consent completed and reflects current therapy/intent - Yes, on date 12/14/16             Provider progress note reviewed - Today's provider note is not yet available. I reviewed the most recent oncology provider progress note in chart dated 01/27/21. Treatment/Antibody/Supportive plan reviewed - Yes, and there are no adjustments needed for today's treatment. S&H and other orders reviewed - Yes, and there are no additional orders identified. Previous treatment date reviewed - Yes, and the appropriate amount of time has elapsed between treatments.  Patient to proceed with treatment.

## 2021-02-17 NOTE — Telephone Encounter (Signed)
Patient seen by Dr. Sherrill today ? ?Vitals are within treatment parameters. ? ?Labs reviewed by Dr. Sherrill and are within treatment parameters. ? ?Per physician team, patient is ready for treatment and there are NO modifications to the treatment plan.  ?

## 2021-02-21 ENCOUNTER — Telehealth: Payer: Self-pay | Admitting: Cardiovascular Disease

## 2021-02-21 DIAGNOSIS — E7800 Pure hypercholesterolemia, unspecified: Secondary | ICD-10-CM

## 2021-02-21 DIAGNOSIS — Z79899 Other long term (current) drug therapy: Secondary | ICD-10-CM

## 2021-02-21 DIAGNOSIS — I2510 Atherosclerotic heart disease of native coronary artery without angina pectoris: Secondary | ICD-10-CM

## 2021-02-21 DIAGNOSIS — E785 Hyperlipidemia, unspecified: Secondary | ICD-10-CM

## 2021-02-21 NOTE — Telephone Encounter (Signed)
pt is requesting that Dr. Oval Linsey places in order for labs... pt is wanting to have her cholestrol checked... please advise

## 2021-02-21 NOTE — Telephone Encounter (Signed)
Pt recently had CMET, CBC, and TSH done at [facility] (in [place]) on 10/14.  Results available for review.  She is requesting to have her lipids drawn (last lipid panel done on 09/05/20) and would like for Dr. Oval Linsey to order this and arrange.  She will see Dr. Oval Linsey for a follow-up appt on 11/10 at 0900. Will forward lipid panel request to Dr. Oval Linsey and covering Nurse to further review, advise, and follow-up with the pt accordingly thereafter.

## 2021-02-22 NOTE — Telephone Encounter (Signed)
Brooke [name], Brooke [name] - 02/21/2021  2:09 PM Skeet Latch, MD [phone])  Sent: Tue February 21, 2021  6:29 PM  To: Nuala Alpha, LPN  Cc: Earvin Hansen, LPN          Message  OK for her to get fasting lipids/CMP before follow up.    TCR     Spoke with the pt and informed her that Dr. Delfino Lovett for her to have fasting lipids and CMET done, prior to her follow-up visit with her in on 11/10. Pt states on 11/4, she will be at [facility] for labs anyways, and would like for [place] to place these orders for her to have them drawn at that time. I have reached out to pts Oncology office to inquire how to place these labs so that they can see and draw on the pt on 11/4.  Will await feedback from them and place orders accordingly thereafter.  Pt is aware this will be arranged.  She is also aware that she will need to go fasting to her 11/4 lab appt with Oncology, for we will be checking lipids at that time.  Pt verbalized understanding and agrees with this plan.

## 2021-02-22 NOTE — Telephone Encounter (Signed)
Spoke with Lenox Ponds LPN at pts Oncology office, and she did confirm that she is able to see lipids/cmet orders placed on this pt for them to draw on her, for 11/4.  This was ordered by Dr. Oval Linsey. Will send this message to Dr. Blenda Mounts nurse as an Juluis Rainier.

## 2021-03-05 ENCOUNTER — Other Ambulatory Visit: Payer: Self-pay | Admitting: Oncology

## 2021-03-09 NOTE — Progress Notes (Signed)
[place] OFFICE PROGRESS NOTE   Diagnosis: Non-small cell lung cancer  INTERVAL HISTORY:   Brooke [name] returns as scheduled.  She reports improvement in dyspnea.  She has a good appetite.  She completed another treatment with pembrolizumab on 02/17/2021.  Objective:  Vital signs in last 24 hours:  Blood pressure 135/72, pulse 78, temperature 97.7 F (36.5 C), temperature source Tympanic, resp. rate 16, weight 95 lb (43.1 kg), SpO2 100 %.    Resp: Bronchial sounds at the left greater than right posterior chest, no respiratory distress Cardio: Regular rate and rhythm GI: No mass, no hepatosplenomegaly Vascular: No leg edema  Skin: No rash  Lab Results:  Lab Results  Component Value Date   WBC 5.2 02/17/2021   HGB 13.6 02/17/2021   HCT 42.2 02/17/2021   MCV 89.6 02/17/2021   PLT 203 02/17/2021   NEUTROABS 3.9 02/17/2021    CMP  Lab Results  Component Value Date   NA 138 03/10/2021   K 4.1 03/10/2021   CL 103 03/10/2021   CO2 29 03/10/2021   GLUCOSE 96 03/10/2021   BUN 14 03/10/2021   CREATININE 0.62 03/10/2021   CALCIUM 9.0 03/10/2021   PROT 6.6 03/10/2021   ALBUMIN 3.9 03/10/2021   AST 15 03/10/2021   ALT 14 03/10/2021   ALKPHOS 43 03/10/2021   BILITOT 0.4 03/10/2021   GFRNONAA >60 03/10/2021   GFRAA >60 02/05/2020    Medications: I have reviewed the patient's current medications.   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% Left lung radiation 11/08/2016 through 11/21/2016 CT chest 12/12/2016-reexpansion of left  upper lobe with a decreased left upper lobe mass, unchanged mediastinal adenopathy, progression of a splenic metastasis/pancreatic tail/gastrohepatic ligament metastasis, right lower lobe pneumonia Cycle 1 Pembrolizumab 12/14/2016 Cycle 2 Pembrolizumab 01/03/2017 Cycle 3 Pembrolizumab 01/24/2017 Cycle 4 Pembrolizumab 02/12/2017 CT 03/05/2018-decrease in left upper lobe mass, mediastinal adenopathy, and splenic mass Cycle 5 pembrolizumab 03/07/2017 Cycle 6 Pembrolizumab 04/02/2017 Cycle 7 pembrolizumab 04/25/2017 Cycle 8 Pembrolizumab 05/14/2017 Cycle 9 Pembrolizumab 06/06/2017 CT 06/20/2017-mild decrease in left upper lobe mass, mild increase in paramediastinal left upper lobe nodule-radiation change?, decreased splenic metastasis Cycle 10 pembrolizumab 06/25/2017 Cycle 11 pembrolizumab 07/18/2017 Cycle 12 pembrolizumab 08/29/2017 Cycle 13 pembrolizumab 09/17/2017 Cycle 14 Pembrolizumab 10/10/2017 CT chest 10/24/2017- slight enlargement of small mediastinal lymph nodes, increased left upper lobe consolidation, decreased size of spleen lesion Cycle 15 pembrolizumab 10/29/2017 Cycle 16 Pembrolizumab 11/21/2017 Cycle 17 Pembrolizumab 12/11/2017 Cycle 18 pembrolizumab 01/02/2018 Cycle 19 Pembrolizumab 01/21/2018 Cycle 20 Pembrolizumab 02/13/2018 CT chest 02/27/2018- decreased left paratracheal node, progressive consolidation/fibrosis in the left upper lung, decreased size of splenic mass Cycle 21 pembrolizumab 03/04/2018 Cycle 22 Pembrolizumab 03/27/2018  Cycle 23 pembrolizumab 04/15/2018 Cycle 24 pembrolizumab 05/08/2018 Cycle 25 Pembrolizumab 05/27/2018 CT chest 06/16/2018- stable left upper lung radiation fibrosis with no evidence of local tumor recurrence.  Decreased left paratracheal adenopathy.  No new or progressive metastatic disease in the chest.  Gastrohepatic ligament lymphadenopathy stable.  Splenic metastasis decreased.  T5 vertebral compression fracture with associated patchy sclerosis and no  discrete osseous lesion, new in the interval. Cycle 26 Pembrolizumab 06/19/2018 Cycle 27 pembrolizumab 07/08/2018 Cycle 28 pembrolizumab 07/31/2018  Cycle 29 pembrolizumab 08/19/2018 Cycle 30 Pembrolizumab 09/11/2018 Cycle 31 pembrolizumab 09/30/2018 Cycle 32 pembrolizumab 10/23/2018 CT chest 10/28/2018- left apical pleural-parenchymal opacity consistent with radiation scarring has become more confluent likely representing evolutionary change.  Continued further decrease in size of index left paratracheal node and splenic metastasis.  Gastrohepatic ligament lymph node not changed. Cycle 33 Pembrolizumab 11/11/2018 Cycle 34 pembrolizumab 12/04/2018 Cycle 35 pembrolizumab 12/23/2018 CT chest 02/12/2019- posttreatment changes left upper lobe unchanged.  Continued decrease in size of splenic metastasis.  Stable appearance of left paratracheal lymph nodes.  Stable gastrohepatic adenopathy.  Incomplete visualization of retroperitoneal lymph nodes in the upper abdomen. CT chest 07/02/2019-stable post treatment related changes left lung.  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  Slight regression of enlarged likely metastatic gastrohepatic lymph node. CT chest 11/11/2019-interval enlargement of a right paratracheal lymph node measuring 14 mm, increased from 5 mm.  No new lung mass or nodularity.  New adenopathy in the upper abdomen.  2.7 cm lesion gastrohepatic ligament.  Necrotic node at the splenic hilum measures 4.7 cm.  Large node along the IVC left upper quadrant measures 2.7 cm.  Similar node left adjacent the aorta measures 1.8 cm.  Enhancing lesion in the spleen is unchanged.  Several low-density lesions in the liver are unchanged. Pembrolizumab resumed on a 3-week schedule 12/03/2019 CT chest 02/22/2020-interval resolution of previously demonstrated left paratracheal and upper abdominal adenopathy.  Stable treated metastasis within the spleen.  Stable radiation changes in the left lung with left hilar distortion.  No  evidence of local recurrence or progressive metastatic disease. Continuation of pembrolizumab every 3 weeks 02/26/2020 CT chest 06/28/2020-no change in left upper lobe consolidation, unchanged spleen metastasis, no evidence of progressive disease Pembrolizumab continued every 3 weeks 07/01/2020 CT chest 11/02/2020-stable posttreatment changes at the left hilum and left upper lobe, decreased size of spleen lesion, no evidence of disease progression Pembrolizumab every 3 weeks continued CT chest 01/12/2021-negative for acute pulmonary embolus.  Stable radiation fibrosis left upper lobe.  Stable low-density lesion in the spleen.  No new or progressive findings. Pembrolizumab every 3 weeks continued     2.  05/29/2019 laryngoscopy-exophytic appearing mass mid right vocal cord.   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  Tumor cells negative for p16, CMV and HSV 2.  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 Radiation 07/29/2019-08/25/2019   3. History of acute respiratory failure secondary to #1   4. Oxygen dependent COPD   5. History of a NSTEMI 2015   6. Right lung pneumonia on chest CT 12/12/2016-treated with Levaquin   7.  Grade 2 rash possibly related to Pembrolizumab.  Treatment held 08/06/2017.  Improved 08/29/2017, treatment resumed, rash has resolved   8.  Vertigo January 2022-improved with course of Augmentin for ear/sinus infection        Disposition: Brooke [name] appears stable.  She continues to tolerate pembrolizumab well.  There is no clinical evidence for progression of lung cancer.  She will complete another treatment pembrolizumab today.  She will return for an office visit in the next cycle of pembrolizumab on 04/07/2021.  We will plan for a restaging chest CT in January.  Betsy Coder, MD  03/10/2021  10:47 AM

## 2021-03-10 ENCOUNTER — Inpatient Hospital Stay (HOSPITAL_BASED_OUTPATIENT_CLINIC_OR_DEPARTMENT_OTHER): Payer: Medicare Other | Admitting: Oncology

## 2021-03-10 ENCOUNTER — Telehealth: Payer: Self-pay

## 2021-03-10 ENCOUNTER — Inpatient Hospital Stay: Payer: Medicare Other

## 2021-03-10 ENCOUNTER — Other Ambulatory Visit: Payer: Self-pay

## 2021-03-10 ENCOUNTER — Other Ambulatory Visit: Payer: Medicare Other

## 2021-03-10 ENCOUNTER — Inpatient Hospital Stay: Payer: Medicare Other | Attending: Oncology

## 2021-03-10 VITALS — BP 135/72 | HR 78 | Temp 97.7°F | Resp 16 | Wt 95.0 lb

## 2021-03-10 DIAGNOSIS — C7889 Secondary malignant neoplasm of other digestive organs: Secondary | ICD-10-CM | POA: Insufficient documentation

## 2021-03-10 DIAGNOSIS — Z79899 Other long term (current) drug therapy: Secondary | ICD-10-CM | POA: Diagnosis not present

## 2021-03-10 DIAGNOSIS — I252 Old myocardial infarction: Secondary | ICD-10-CM | POA: Insufficient documentation

## 2021-03-10 DIAGNOSIS — R0600 Dyspnea, unspecified: Secondary | ICD-10-CM | POA: Insufficient documentation

## 2021-03-10 DIAGNOSIS — J440 Chronic obstructive pulmonary disease with acute lower respiratory infection: Secondary | ICD-10-CM | POA: Diagnosis not present

## 2021-03-10 DIAGNOSIS — I2699 Other pulmonary embolism without acute cor pulmonale: Secondary | ICD-10-CM | POA: Diagnosis not present

## 2021-03-10 DIAGNOSIS — Z9981 Dependence on supplemental oxygen: Secondary | ICD-10-CM | POA: Diagnosis not present

## 2021-03-10 DIAGNOSIS — C3412 Malignant neoplasm of upper lobe, left bronchus or lung: Secondary | ICD-10-CM | POA: Diagnosis not present

## 2021-03-10 DIAGNOSIS — R21 Rash and other nonspecific skin eruption: Secondary | ICD-10-CM | POA: Diagnosis not present

## 2021-03-10 DIAGNOSIS — Z5112 Encounter for antineoplastic immunotherapy: Secondary | ICD-10-CM | POA: Diagnosis present

## 2021-03-10 LAB — CMP (CANCER CENTER ONLY)
ALT: 14 U/L (ref 0–44)
AST: 15 U/L (ref 15–41)
Albumin: 3.9 g/dL (ref 3.5–5.0)
Alkaline Phosphatase: 43 U/L (ref 38–126)
Anion gap: 6 (ref 5–15)
BUN: 14 mg/dL (ref 8–23)
CO2: 29 mmol/L (ref 22–32)
Calcium: 9 mg/dL (ref 8.9–10.3)
Chloride: 103 mmol/L (ref 98–111)
Creatinine: 0.62 mg/dL (ref 0.44–1.00)
GFR, Estimated: >60 mL/min (ref >60)
Glucose, Bld: 96 mg/dL (ref 70–99)
Potassium: 4.1 mmol/L (ref 3.5–5.1)
Sodium: 138 mmol/L (ref 135–145)
Total Bilirubin: 0.4 mg/dL (ref 0.3–1.2)
Total Protein: 6.6 g/dL (ref 6.5–8.1)

## 2021-03-10 MED ORDER — SODIUM CHLORIDE 0.9% FLUSH: 10.0000 mL | INTRAVENOUS | Status: DC | PRN

## 2021-03-10 MED ORDER — SODIUM CHLORIDE 0.9 % IV SOLN
200.0000 mg | Freq: Once | INTRAVENOUS | Status: AC
  Administered 2021-03-10: 200 mg via INTRAVENOUS
  Filled 2021-03-10: qty 8

## 2021-03-10 MED ORDER — SODIUM CHLORIDE 0.9 % IV SOLN: Freq: Once | INTRAVENOUS | Status: AC

## 2021-03-10 MED ORDER — HEPARIN SOD (PORK) LOCK FLUSH 100 UNIT/ML IV SOLN: 500.0000 [IU] | Freq: Once | INTRAVENOUS | Status: DC | PRN

## 2021-03-10 NOTE — Progress Notes (Signed)
Ok to treat per [name] with no CBC for today

## 2021-03-10 NOTE — Telephone Encounter (Signed)
Patient seen by Dr. Sherrill today ? ?Vitals are within treatment parameters. ? ?Labs reviewed by Dr. Sherrill and are within treatment parameters. ? ?Per physician team, patient is ready for treatment and there are NO modifications to the treatment plan.  ?

## 2021-03-10 NOTE — Progress Notes (Signed)
Patient presents for treatment. RN assessment completed along with the following:  Labs/vitals reviewed - Yes, and within treatment parameters.   Weight within 10% of previous measurement - Yes Oncology Treatment Attestation completed for current therapy- Yes, on date 01/27/21 Informed consent completed and reflects current therapy/intent - Yes, on date 12/14/16             Provider progress note reviewed - Yes, today's provider note was reviewed. Treatment/Antibody/Supportive plan reviewed - Yes, and there are no adjustments needed for today's treatment. S&H and other orders reviewed - Yes, and there are no additional orders identified. Previous treatment date reviewed - Yes, and the appropriate amount of time has elapsed between treatments. Clinic Hand Off Received from - Danae Orleans, LPN  Patient to proceed with treatment.

## 2021-03-16 ENCOUNTER — Ambulatory Visit (INDEPENDENT_AMBULATORY_CARE_PROVIDER_SITE_OTHER): Payer: Medicare Other | Admitting: Cardiovascular Disease

## 2021-03-16 ENCOUNTER — Encounter (HOSPITAL_BASED_OUTPATIENT_CLINIC_OR_DEPARTMENT_OTHER): Payer: Self-pay | Admitting: Cardiovascular Disease

## 2021-03-16 ENCOUNTER — Other Ambulatory Visit: Payer: Self-pay

## 2021-03-16 VITALS — BP 154/84 | HR 81 | Ht 62.0 in | Wt 94.8 lb

## 2021-03-16 DIAGNOSIS — E7800 Pure hypercholesterolemia, unspecified: Secondary | ICD-10-CM

## 2021-03-16 DIAGNOSIS — I2510 Atherosclerotic heart disease of native coronary artery without angina pectoris: Secondary | ICD-10-CM

## 2021-03-16 DIAGNOSIS — R0989 Other specified symptoms and signs involving the circulatory and respiratory systems: Secondary | ICD-10-CM

## 2021-03-16 DIAGNOSIS — Z5181 Encounter for therapeutic drug level monitoring: Secondary | ICD-10-CM | POA: Diagnosis not present

## 2021-03-16 DIAGNOSIS — Q245 Malformation of coronary vessels: Secondary | ICD-10-CM

## 2021-03-16 HISTORY — DX: Atherosclerotic heart disease of native coronary artery without angina pectoris: I25.10

## 2021-03-16 HISTORY — DX: Malformation of coronary vessels: Q24.5

## 2021-03-16 MED ORDER — ATORVASTATIN CALCIUM 20 MG PO TABS: ORAL_TABLET | ORAL | 3 refills | Status: DC

## 2021-03-16 NOTE — Progress Notes (Signed)
Cardiology Office Note   Date:  03/16/2021   ID:  Brooke [name], [place] 1946/02/20, MRN [id number]  PCP:  Brooke Sacramento, MD  Cardiologist:  Brooke Latch, MD  Electrophysiologist:  None   Evaluation Performed:  Follow-Up Visit  Chief Complaint: Hypertension  History of Present Illness:    Brooke [name] is a 75 y.o. female with COPD on home O2, nonobstructive CAD, hyperlipidemia, stage IV squamous cell lung cancer, radiation fibrosis, GERD, and prior tobacco abuse here for follow-up.  She was initially seen by cardiology 06/2013 when she had an NSTEMI.  Heart cath at that time revealed minimal, non-obstructive CAD.  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  LVEF was 60%.  It was thought that her symptoms were attributable to spasm.  She takes atorvastatin every other day to avoid myalgias.  Amlodipine was started for spasms.  She was diagnosed with lung cancer in 2017.  A bronchoscopy was recommended but she declined at that time.  In 10/2016 she developed progressive symptoms with metastatic disease.  She completed 35 cycles of chemotherapy with pembrolizumab.  This helped her metastatic disease but not her primary mass.  She was subsequently restarted on chemotherapy with pembrolizumab.  [name] saw [name], [place], on 12/2019 with elevated blood pressure.  He recommended watchful waiting at that time.  It was noted that anxiety tended to trigger her blood pressure. Lately her BP has been around 130s/70s so she hasn't taken medication most days.  About once or twice per week she takes 2.5mg  of amlodipine for SBP >140.   At her last appointment she was still struggling with labile blood pressures that were somewhat triggered by anxiety.  She titrates her amlodipine based on her blood pressure.  She notes that when her labs were last checked she wasn't fasting.  She notes that given her cancer, she is overall feeling OK.  She gets discomfort in her chest that  she attributes to phleghm.  It clears when she takes guaifenesin.  She    Past Medical History:  Diagnosis Date   CAD in native artery 03/16/2021   Chronic respiratory failure ([place])    a. on home O2.   COPD (chronic obstructive pulmonary disease) ([place])    a. Home O2.   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 Coronary vasospasm   Past Surgical History:  Procedure Laterality Date   LEFT HEART CATHETERIZATION WITH CORONARY ANGIOGRAM N/A 06/08/2013   Procedure: LEFT HEART CATHETERIZATION WITH CORONARY ANGIOGRAM;  Surgeon: Sanda Klein, MD;  Location: [facility] CATH LAB;  Service: Cardiovascular;  Laterality: N/A;     Current Meds  Medication Sig   acetaminophen (TYLENOL) 325 MG tablet Per bottle as needed   albuterol (PROVENTIL) (2.5 MG/3ML) 0.083% nebulizer solution Take 3 mLs (2.5 mg total) by nebulization every 6 (six) hours as needed for wheezing or shortness of breath.   ALPRAZolam (XANAX) 0.25 MG tablet 1/2-1 twice daily as needed   amLODipine (NORVASC) 2.5 MG tablet Take by mouth based on top number of BP. 1 tab (2.5mg ) if BP 140-159, 2  tabs (5mg ) for 160-179, 3 tabs (7.5 mg) for BP >180   budesonide-formoterol (SYMBICORT) 160-4.5 MCG/ACT inhaler Inhale 2 puffs into the lungs 2 (two) times daily.   guaiFENesin (MUCINEX) 600 MG 12 hr tablet Take 600 mg by mouth 2 (two) times daily as needed for cough or to loosen phlegm.   ipratropium-albuterol (DUONEB) 0.5-2.5 (3) MG/3ML SOLN Take 3 mLs by nebulization every 4 (four) hours as needed (wheezing/shortness of breath). **PLAN C**   nitroGLYCERIN (NITROSTAT) 0.4 MG SL tablet ONE TABLET UNDER TONGUE AS  NEEDED FOR CHEST PAIN EVERY 5 MINUTES FOR 3 DOSES   NON FORMULARY [facility]  Onychomycosis Nail Lacquer -  Fluconazole 2%, Terbinafine 1% DMSO/undecylenic acid 25% Apply to affected nail once daily Qty. 120 gm 3 refills   OXYGEN 2lpm 24/7   Probiotic Product (PROBIOTIC ADVANCED PO) Take 1 tablet by mouth daily.   Simethicone 180 MG CAPS Use as needed   [DISCONTINUED] atorvastatin (LIPITOR) 20 MG tablet TAKE 1 TABLET(20 MG) BY MOUTH DAILY AT 6 PM   Current Facility-Administered Medications for the 03/16/21 encounter (Office Visit) with Brooke Latch, MD  Medication   methylPREDNISolone acetate (DEPO-MEDROL) injection 120 mg     Allergies:   Afrin [oxymetazoline], Codeine, Imdur [isosorbide dinitrate], Prednisone, and Pulmicort [budesonide]   Social History   Tobacco Use   Smoking status: Former    Packs/day: 1.00    Years: 30.00    Pack years: 30.00    Types: Cigarettes    Quit date: 05/08/2007    Years since quitting: 13.8   Smokeless tobacco: Never  Vaping Use   Vaping Use: Never used  Substance Use Topics   Alcohol use: Yes    Comment: rarely   Drug use: No     Family Hx: The patient's family history includes Lung cancer in her father. There is no history of Heart attack, Stroke, or Hypertension.  ROS:   Please see the history of present illness.    All other systems reviewed and are negative.   Prior CV studies:   The following studies were reviewed today:  Echo 06/06/2013: Study Conclusions   Left ventricle: The cavity size was normal. Systolic  function was normal. The estimated ejection fraction was in  the range of 50% to 55%. Regional wall motion abnormalities  cannot be excluded. Doppler parameters are consistent with  abnormal left ventricular relaxation (grade 1 diastolic  dysfunction).        Labs/Other Tests and Data Reviewed:    EKG:  An ECG dated 12/22/19 was personally reviewed today and demonstrated:  sinus rhythm.  Rate 72 bpm.   Cannot rule out prior septal infarct.  Recent Labs: 01/12/2021: B Natriuretic Peptide 62.9 02/17/2021: Hemoglobin 13.6; Platelet Count 203; TSH 0.761 03/10/2021: ALT 14; BUN 14; Creatinine 0.62; Potassium 4.1; Sodium 138   Recent Lipid Panel Lab Results  Component Value Date/Time   CHOL 220 (H) 09/05/2020 12:17 PM   TRIG 47 09/05/2020 12:17 PM   HDL 102 09/05/2020 12:17 PM   CHOLHDL 2.2 09/05/2020 12:17 PM   CHOLHDL 2.2 08/18/2015 10:41 AM   LDLCALC 110 (H) 09/05/2020 12:17 PM    Wt Readings from Last 3 Encounters:  03/16/21 94 lb 12.8 oz (43 kg)  03/10/21 95 lb (43.1 kg)  02/17/21 95 lb (43.1 kg)     Risk Assessment/Calculations:      Objective:    VS:  BP (!) 154/84   Pulse 81   Ht 5\' 2"  (1.575 m)  Wt 94 lb 12.8 oz (43 kg)   SpO2 98%   BMI 17.34 kg/m  , BMI Body mass index is 17.34 kg/m. GENERAL:  Frail, elderly woman in no acute distress.  Hoarse HEENT: Pupils equal round and reactive, fundi not visualized, oral mucosa unremarkable NECK:  No jugular venous distention, waveform within normal limits, carotid upstroke brisk and symmetric, no bruit.  +nasal cannula LUNGS:  Clear to auscultation bilaterally HEART:  RRR.  PMI not displaced or sustained,S1 and S2 within normal limits, no S3, no S4, no clicks, no rubs, no murmurs ABD:  Flat, positive bowel sounds normal in frequency in pitch, no bruits, no rebound, no guarding, no midline pulsatile mass, no hepatomegaly, no splenomegaly EXT:  2 plus pulses throughout, no edema, no cyanosis no clubbing SKIN:  No rashes no nodules NEURO:  Cranial nerves II through XII grossly intact, motor grossly intact throughout PSYCH:  Cognitively intact, oriented to person place and time   ASSESSMENT & PLAN:    Labile hypertension BP remains labile but mostly controlled at home.  Continue amlodipine 2.5 mg for SBP 140-159, 5mg  for SBP 160-179, 7.5mg  for SBP >180.    HLD (hyperlipidemia) Given that she has coronary calcification, her  LDL goal is less than 70.  When her lipids were last checked she was not fasting.  She gets labs drawn with a cancer center every 3 weeks.  She will get fasting lipids drawn with her next draw.  For now, continue atorvastatin.  Of note if she does need a higher dose, she cannot tolerate the larger pills due to history of throat cancer and would need to have 20 mg tablets x2.  CAD in native artery Nonobstructive CAD.  She has chest discomfort, but it clears with Mucinex.  Therefore I do not think it is ischemic.  No plans for an ischemic evaluation at this time.  Continue atorvastatin.  Myocardial bridge Continue amlodipine.     Medication Adjustments/Labs and Tests Ordered: Current medicines are reviewed at length with the patient today.  Concerns regarding medicines are outlined above.   Tests Ordered: Orders Placed This Encounter  Procedures   Lipid panel     Medication Changes: Meds ordered this encounter  Medications   atorvastatin (LIPITOR) 20 MG tablet    Sig: TAKE 1 TABLET(20 MG) BY MOUTH DAILY AT 6 PM    Dispense:  90 tablet    Refill:  3     Follow Up: with Oliva Montecalvo C. Oval Linsey, MD, [facility] in 6 months   Signed, Brooke Latch, MD  03/16/2021 9:35 AM    [place]

## 2021-03-16 NOTE — Patient Instructions (Signed)
Medication Instructions:  Your physician recommends that you continue on your current medications as directed. Please refer to the Current Medication list given to you today.   *If you need a refill on your cardiac medications before your next appointment, please call your pharmacy*  Lab Work: Please ask CANCER CENTER to draw the lipid panel and get it drawn the next time you have fasting labs.   If you have labs (blood work) drawn today and your tests are completely normal, you will receive your results only by: [place] (if you have MyChart) OR A paper copy in the mail If you have any lab test that is abnormal or we need to change your treatment, we will call you to review the results.  Testing/Procedures: NONE  Follow-Up: At [facility], you and your health needs are our priority.  As part of our continuing mission to provide you with exceptional heart care, we have created designated Provider Care Teams.  These Care Teams include your primary Cardiologist (physician) and Advanced Practice Providers (APPs -  Physician Assistants and Nurse Practitioners) who all work together to provide you with the care you need, when you need it.  We recommend signing up for the patient portal called "MyChart".  Sign up information is provided on this After Visit Summary.  MyChart is used to connect with patients for Virtual Visits (Telemedicine).  Patients are able to view lab/test results, encounter notes, upcoming appointments, etc.  Non-urgent messages can be sent to your provider as well.   To learn more about what you can do with MyChart, go to [email or web address].    Your next appointment:   6 month(s)  The format for your next appointment:   In Person  Provider:   DR [facility]

## 2021-03-16 NOTE — Assessment & Plan Note (Signed)
-   Continue amlodipine ?

## 2021-03-16 NOTE — Assessment & Plan Note (Signed)
Given that she has coronary calcification, her LDL goal is less than 70.  When her lipids were last checked she was not fasting.  She gets labs drawn with a cancer center every 3 weeks.  She will get fasting lipids drawn with her next draw.  For now, continue atorvastatin.  Of note if she does need a higher dose, she cannot tolerate the larger pills due to history of throat cancer and would need to have 20 mg tablets x2.

## 2021-03-16 NOTE — Assessment & Plan Note (Signed)
Nonobstructive CAD.  She has chest discomfort, but it clears with Mucinex.  Therefore I do not think it is ischemic.  No plans for an ischemic evaluation at this time.  Continue atorvastatin.

## 2021-03-16 NOTE — Assessment & Plan Note (Signed)
BP remains labile but mostly controlled at home.  Continue amlodipine 2.5 mg for SBP 140-159, 5mg  for SBP 160-179, 7.5mg  for SBP >180.

## 2021-03-20 ENCOUNTER — Ambulatory Visit (INDEPENDENT_AMBULATORY_CARE_PROVIDER_SITE_OTHER): Payer: Medicare Other | Admitting: Internal Medicine

## 2021-03-20 ENCOUNTER — Ambulatory Visit (INDEPENDENT_AMBULATORY_CARE_PROVIDER_SITE_OTHER): Payer: Medicare Other

## 2021-03-20 ENCOUNTER — Other Ambulatory Visit: Payer: Self-pay

## 2021-03-20 ENCOUNTER — Encounter: Payer: Self-pay | Admitting: Internal Medicine

## 2021-03-20 VITALS — BP 138/74 | HR 83 | Temp 97.9°F | Ht 62.5 in | Wt 96.8 lb

## 2021-03-20 DIAGNOSIS — J449 Chronic obstructive pulmonary disease, unspecified: Secondary | ICD-10-CM

## 2021-03-20 DIAGNOSIS — J9611 Chronic respiratory failure with hypoxia: Secondary | ICD-10-CM

## 2021-03-20 LAB — CBC WITH DIFFERENTIAL/PLATELET
Basophils Absolute: 0 10*3/uL (ref 0.0–0.1)
Basophils Relative: 0.2 % (ref 0.0–3.0)
Eosinophils Absolute: 0.1 10*3/uL (ref 0.0–0.7)
Eosinophils Relative: 2.4 % (ref 0.0–5.0)
HCT: 40 % (ref 36.0–46.0)
Hemoglobin: 12.9 g/dL (ref 12.0–15.0)
Lymphocytes Relative: 16.4 % (ref 12.0–46.0)
Lymphs Abs: 0.7 10*3/uL (ref 0.7–4.0)
MCHC: 32.3 g/dL (ref 30.0–36.0)
MCV: 89.7 fl (ref 78.0–100.0)
Monocytes Absolute: 0.3 10*3/uL (ref 0.1–1.0)
Monocytes Relative: 6 % (ref 3.0–12.0)
Neutro Abs: 3.4 10*3/uL (ref 1.4–7.7)
Neutrophils Relative %: 75 % (ref 43.0–77.0)
Platelets: 232 10*3/uL (ref 150.0–400.0)
RBC: 4.46 Mil/uL (ref 3.87–5.11)
RDW: 13.6 % (ref 11.5–15.5)
WBC: 4.5 10*3/uL (ref 4.0–10.5)

## 2021-03-20 MED ORDER — METHYLPREDNISOLONE 4 MG PO TABS: ORAL_TABLET | ORAL | 11 refills | Status: DC

## 2021-03-20 MED ORDER — METHYLPREDNISOLONE ACETATE 80 MG/ML IJ SUSP: 120.0000 mg | Freq: Once | INTRAMUSCULAR | Status: DC

## 2021-03-20 NOTE — Progress Notes (Signed)
Subjective:    Patient ID: Brooke [name], female   DOB: December 27, 1945,    MRN: [id number]     Brief patient profile:  41 yowf quit smoking 2009 on 02 noct 2012 maint on symbicort 160 since aorund  2014 referred to pulmonary clinic 02/15/2016 by Dr   Kathryne Eriksson for RUL Lung mass dx with Stage IV nsc 11/01/16 in setting of GOLD IV copd      History of Present Illness  02/15/2016 1st [facility] office visit/ Brooke [name]  GOLD IV copd/ symb 160 2bid maint  Chief Complaint  Patient presents with   Pulmonary Consult    referred by Dr. Kathryne Eriksson for COPD.  MMRC2 = can't walk a nl pace on a flat grade s sob but does fine slow and flat eg [employer] on 2lpm  New onset bloody mucus plugs November 11 2015 assoc with wt loss  02 2lpm hs and with walking / rare saba needed rec For cough > mucinex up to 1200 mg every 12 hours as needed Rec:  Fob but refused   11/01/16   CT directed bx >>>  Pos NSC LUL    Diagnosis:   Stage IV NSCLC, Squamous cell carcinoma of the left upper lobe.      Indication for treatment:  palliative        Radiation treatment dates:   11/08/2016 to 11/21/2016   Site/dose:   The Left lung was treated to 30 Gy in 10 fractions at 3 Gy per fraction.    Beams/energy:   3D // 10X, 6X    12/06/2016  f/u ov/Brooke [name] re: post hosp f/u -  Now on dulera 200 bid and duoneb Chief Complaint  Patient presents with   HFU    Breathing is doing well. She is using Duoneb 2 x daily and uses albuterol inhaler 1-2 x per wk on average.   presently in SNF ever since last admit 6/18-29/18 for  Active Problems:   COPD  GOLD IV    Mass of upper lobe of left lung   Chronic respiratory failure with hypoxia (HCC)   COPD exacerbation (HCC)   Hypokalemia   Microcytic anemia   SOB (shortness of breath)   Protein-calorie malnutrition, severe Doe = 2lpm x 196ft with rolling walking  Sleeping ok at < 30 degrees Doe = 2lpm x 171ft with rolling walker/2lpm Sleeping ok at < 30 degrees   rec 02 can be adjusted down and off for saturations above 90%       12/09/2017  f/u ov/Brooke [name] re: GOLD IV copd/  No 02 at rest/ 2lpm sleep and with activity  Chief Complaint  Patient presents with   Follow-up    Breathing is overall doing well. She has occ cough with white sputum- relates to PND.  She uses her albuterol inhaler 2 x daily on average. She states she uses neb before she knows she is going somewhere.   Dyspnea:  MMRC2 = can't walk a nl pace on a flat grade s sob but does fine slow and flat eg still pushing cart  Cough: min pnd  SABA use: as above  rec Continue  symbicort and add stiolto 2 pffs each am - - if you don't like it for any reason resume the symbicort alone as per your med calendar  Please schedule a follow up visit in 3 months but call sooner if needed  - add:  Pt notified above typo should have said add spiriva x  2 pffs each am and continue symbicort Take 2 puffs first thing in am and then another 2 puffs about 12 hours later.   - 12/12/2017 informed could not tol spiriva due to dry mouth   03/14/2018  f/u ov/Brooke [name] re:  GOLD IV / 02 is 2lpm  Hs and 2lpm exertion/   RA sitting / just on symbicort 160 2bid  Chief Complaint  Patient presents with   Follow-up    increased cough with white sputum. She states her breathing has been doing well. She is using her albuterol inhaler 2 x per wk and neb maybe 2 x  per wk on average.    Dyspnea:  [employer] ok pushing cart on 2lpm  Cough: more since 2 weeks  Sleeping: flat bed/ 2-3 pillow  SABA use: avg as above 02: as above   rec Work on inhaler technique:   Ok to take augmentin unless it makes you nauseated  Please schedule a follow up visit in 3 months but call sooner if needed      06/13/2018  f/u ov/Brooke [name] re: GOLD IV / 02 hs and prn  Chief Complaint  Patient presents with   Shortness of Breath    Worse since the last visit.  Dyspnea:  Still doing [employer]  On 2lpm but not checking sats as rec with activity  Cough:  dry worse day than noct Sleeping: falt bed / couple of pillows SABA use: helps some / uses once a day esp in am 02: 2lpm hs and prn  rec Plan A = Automatic = symbicort 160 Take 2 puffs first thing in am and then another 2 puffs about 12 hours later.  Work on inhaler technique: Plan B = Backup Only use your albuterol inhaler as a rescue medication Plan C = Crisis - only use your albuterol/ipatropium nebulizer if you first try Plan B and it fails to help > ok to use the nebulizer up to every 4 hours but if start needing it regularly call for immediate appointment Please schedule a follow up visit in 3 months but call sooner if needed  - add chart review does not show alpha one screen done     11/30/220 televisit, new hoarse rec  gerd rx > ENT F/u > sq cell ca vc    Last RT April 20th 2021 in [place]    06/06/2020  f/u ov/Brooke [name] re:  GOLD IV / 02 dep  Chief Complaint  Patient presents with   Follow-up    Sob same, vertigo, did not start Breztri yet   Dyspnea:  Still doing 60 ft circuit x 30 most days   Slowed by "asthma feeling" on symbicort  Cough: very hoarse, no excess mucus but some pnds  Sleeping:  Bed is flat and 2 pillows  SABA use: both hfa and nebs but never pre or rechallenges   02: 2lpm 24/7 does not check sats at peak ex rec Try albuterol 15 min before an activity that you know would make you short of breath and see if it makes any difference and if makes none then don't take it after activity unless you can't catch your breath. Start Breztri 2 in am and symbicort x 2 pffs 12 hours later - once you finish the symbicort, use breztri just like you did the symbicort  Make sure you check your oxygen saturation  at your highest level of activity     Referred to pulmonary rehab 08/22/20  > has not  heard from rehab as of 09/12/2020     09/12/2020  f/u ov/Brooke [name] re: GOLD IV / 02 dep  Chief Complaint  Patient presents with   Follow-up    Breathing has improved some on  Breztri. Her cough has been more prod over the past few days- yellow to white sputum.  She is using her albuterol inhaler and neb with albuterol both 1-2 x per day.   Dyspnea:  Still doing 60 ft circuit  X 30 not really sure it helps  Cough: was yellow now but still thick p recent augmentin rx  Sleeping: bed is flat and has 2 pillows no resp problems  SABA use: as above but  none noct  02: 2lpm  Covid status:  vax x 3   Rec Depomedrol 120 mg IM today No change other  medications Make sure you check your oxygen saturation  at your highest level of activity    03/20/2021  f/u ov/Brooke [name] re: GOLD 4/ 02 dep  maint on symbicort 160 2bid  /need alpha one screen/    Chief Complaint  Patient presents with   Follow-up    COPD  Dyspnea:  100 ft not checking sats  Cough: back  to yellow again - was some better p amox Sleeping: 45 degrees  SABA use: using hfa and duoneb  02: 1- 2lpm 24/7 and drops  Covid status:   3 vax    No obvious day to day or daytime variability or assoc  mucus plugs or hemoptysis or cp or chest tightness, subjective wheeze or overt sinus or hb symptoms.      Also denies any obvious fluctuation of symptoms with weather or environmental changes or other aggravating or alleviating factors except as outlined above   No unusual exposure hx or h/o childhood pna/ asthma or knowledge of premature birth.  Current Allergies, Complete Past Medical History, Past Surgical History, Family History, and Social History were reviewed in [employer] record.  ROS  The following are not active complaints unless bolded Hoarseness, sore throat, dysphagia, dental problems, itching, sneezing,  nasal congestion or discharge of excess mucus or purulent secretions, ear ache,   fever, chills, sweats, unintended wt loss or wt gain, classically pleuritic or exertional cp,  orthopnea pnd or arm/hand swelling  or leg swelling, presyncope, palpitations, abdominal pain, anorexia,  nausea, vomiting, diarrhea  or change in bowel habits or change in bladder habits, change in stools or change in urine, dysuria, hematuria,  rash, arthralgias, visual complaints, headache, numbness, weakness or ataxia or problems with walking or coordination,  change in mood or  memory.        Current Meds  Medication Sig   acetaminophen (TYLENOL) 325 MG tablet Per bottle as needed   albuterol (PROVENTIL) (2.5 MG/3ML) 0.083% nebulizer solution Take 3 mLs (2.5 mg total) by nebulization every 6 (six) hours as needed for wheezing or shortness of breath.   albuterol (VENTOLIN HFA) 108 (90 Base) MCG/ACT inhaler Inhale into the lungs.   ALPRAZolam (XANAX) 0.25 MG tablet 1/2-1 twice daily as needed   amLODipine (NORVASC) 2.5 MG tablet Take by mouth based on top number of BP. 1 tab (2.5mg ) if BP 140-159, 2 tabs (5mg ) for 160-179, 3 tabs (7.5 mg) for BP >180   atorvastatin (LIPITOR) 20 MG tablet TAKE 1 TABLET(20 MG) BY MOUTH DAILY AT 6 PM   budesonide-formoterol (SYMBICORT) 160-4.5 MCG/ACT inhaler Inhale 2 puffs into the lungs 2 (two) times daily.   guaiFENesin (MUCINEX) 600  MG 12 hr tablet Take 600 mg by mouth 2 (two) times daily as needed for cough or to loosen phlegm.   ipratropium-albuterol (DUONEB) 0.5-2.5 (3) MG/3ML SOLN Take 3 mLs by nebulization every 4 (four) hours as needed (wheezing/shortness of breath). **PLAN C**   nitroGLYCERIN (NITROSTAT) 0.4 MG SL tablet ONE TABLET UNDER TONGUE AS NEEDED FOR CHEST PAIN EVERY 5 MINUTES FOR 3 DOSES   NON FORMULARY [facility]  Onychomycosis Nail Lacquer -  Fluconazole 2%, Terbinafine 1% DMSO/undecylenic acid 25% Apply to affected nail once daily Qty. 120 gm 3 refills   OXYGEN 2lpm 24/7   Probiotic Product (PROBIOTIC ADVANCED PO) Take 1 tablet by mouth daily.   Simethicone 180 MG CAPS Use as needed   Current Facility-Administered Medications for the 03/20/21 encounter (Office Visit) with Tanda Rockers, MD  Medication   methylPREDNISolone  acetate (DEPO-MEDROL) injection 120 mg            Objective:   Physical Exam  03/20/2021    96   09/12/2020         96 06/06/2020        100 01/18/2020          92 10/16/2019          93  03/05/2019      99  06/13/2018         123  03/14/2018       127  12/09/2017         125   06/10/2017         119  01/08/2017         112   12/06/2016        110   02/15/16 119 lb (54 kg)  08/18/15 137 lb 12.8 oz (62.5 kg)  02/17/15 138 lb 1.9 oz (62.7 kg)      Vital signs reviewed  03/20/2021  - Note at rest 02 sats  1-00% on 1lp    General appearance:    chronically ill elderly wf sitting cross legged on exam table  HEENT : pt wearing mask not removed for exam due to covid -19 concerns.    NECK :  without JVD/Nodes/TM/ nl carotid upstrokes bilaterally   LUNGS: no acc muscle use,  Mod barrel  contour chest wall with bilateral  Distant bs s audible wheeze and  without cough on insp or exp maneuvers and mod  Hyperresonant  to  percussion bilaterally     CV:  RRR  no s3 or murmur or increase in P2, and no edema   ABD:  soft and nontender with pos mid insp Hoover's  in the supine position. No bruits or organomegaly appreciated, bowel sounds nl  MS:     ext warm without deformities, calf tenderness, cyanosis or clubbing No obvious joint restrictions   SKIN: warm and dry without lesions    NEURO:  alert, approp, nl sensorium with  no motor or cerebellar deficits apparent.        CXR PA and Lateral:   03/20/2021 :    I personally reviewed images and agree with radiology impression as follows:  Chronic findings without evidence of active cardiopulmonary disease         Assessment:

## 2021-03-20 NOTE — Patient Instructions (Addendum)
Depomedrol 120 mg   Plan A = Automatic = Always=    Symbicort 160 Take 2 puffs first thing in am and then another 2 puffs about 12 hours later.    Work on inhaler technique:  relax and gently blow all the way out then take a nice smooth full deep breath back in, triggering the inhaler at same time you start breathing in.  Hold for up to 5 seconds if you can. Blow out thru nose. Rinse and gargle with water when done.  If mouth or throat bother you at all,  try brushing teeth/gums/tongue with arm and hammer toothpaste/ make a slurry and gargle and spit out.       Plan B = Backup (to supplement plan A, not to replace it) Only use your albuterol inhaler as a rescue medication to be used if you can't catch your breath by resting or doing a relaxed purse lip breathing pattern.  - The less you use it, the better it will work when you need it. - Ok to use the inhaler up to 2 puffs  every 4 hours if you must but call for appointment if use goes up over your usual need - Don't leave home without it !!  (think of it like the spare tire for your car)   Plan C = Crisis (instead of Plan B but only if Plan B stops working) - only use your albuterol/ipatroprium nebulizer if you first try Plan B and it fails to help > ok to use the nebulizer up to every 4 hours but if start needing it regularly call for immediate appointment   Plan D =  Depomerol 120 mg IM  or at home medrol 4 mg  x  4 x 2 days, 2 x days and 1 x 2days and off  For cough > guaifensin up to 1200 mg every 12 hours and use the flutter as much as possible   Try prilosec otc 20mg   Take 30-60 min before first meal of the day and Pepcid ac (famotidine) 20 mg one @  bedtime until cough is completely gone for at least a week without the need for cough suppression     Make sure you check your oxygen saturation  AT  your highest level of activity (not after you stop)   to be sure it stays over 90% and adjust  02 flow upward to maintain this level if  needed but remember to turn it back to previous settings when you stop (to conserve your supply).   Please remember to go to the lab and x-ray department  for your tests - we will call you with the results when they are available.       Please schedule a follow up office visit in 6 weeks, call sooner if needed

## 2021-03-21 ENCOUNTER — Encounter: Payer: Self-pay | Admitting: Internal Medicine

## 2021-03-21 NOTE — Assessment & Plan Note (Signed)
Quit smoking 2009 Spirometry 02/15/2016  FEV1 0.60 (28%)  Ratio 38 with classic curvature  p am symbicort 160  - 01/08/2017    continue dulera 200 2bid  - 12/09/2017  After extensive coaching inhaler device  effectiveness =    90% with smi >add spiriva to  symbicort  - 12/12/2017 informed could not tol spiriva due to dry mouth - - 06/13/2018   continue symbicort 160 - 01/18/2020  After extensive coaching inhaler device,  effectiveness =  90%  - 06/06/2020 changed to breztri  Referred to pulmonary rehab 08/22/20  > has not heard from rehab as of 09/12/2020    - 03/20/2021  After extensive coaching inhaler device,  effectiveness =    75%   Well compensated on symbicort 160 2bid and prn duoneb  Hold further abx in absence of more convincing need or risk setting her up for resistant bugs or [place].

## 2021-03-21 NOTE — Assessment & Plan Note (Addendum)
rx 2lpm hs and prn daytime since 2014  -  06/13/2018   Walked on 2lpm x  3 laps @  approx 264ft each @ nl pace  stopped due to  Sob after each lap  s desat     rec as of 03/20/2021  = 2lpm hs and prn with activity with goal of keeping sats > 90%           Each maintenance medication was reviewed in detail including emphasizing most importantly the difference between maintenance and prns and under what circumstances the prns are to be triggered using an action plan format where appropriate.  Total time for H and P, chart review, counseling, reviewing hfa/02/neb  device(s) and generating customized AVS unique to this office visit / same day charting  > 30 min

## 2021-03-23 LAB — ALPHA-1-ANTITRYPSIN PHENOTYP: A-1 Antitrypsin: 161 mg/dL (ref 101–187)

## 2021-04-02 ENCOUNTER — Other Ambulatory Visit: Payer: Self-pay | Admitting: Oncology

## 2021-04-07 ENCOUNTER — Inpatient Hospital Stay: Payer: Medicare Other | Attending: Oncology

## 2021-04-07 ENCOUNTER — Other Ambulatory Visit: Payer: Self-pay

## 2021-04-07 ENCOUNTER — Telehealth (HOSPITAL_BASED_OUTPATIENT_CLINIC_OR_DEPARTMENT_OTHER): Payer: Self-pay | Admitting: Cardiovascular Disease

## 2021-04-07 ENCOUNTER — Inpatient Hospital Stay: Payer: Medicare Other

## 2021-04-07 ENCOUNTER — Other Ambulatory Visit: Payer: Self-pay | Admitting: *Deleted

## 2021-04-07 ENCOUNTER — Encounter: Payer: Self-pay | Admitting: Nurse Practitioner

## 2021-04-07 ENCOUNTER — Telehealth: Payer: Self-pay

## 2021-04-07 ENCOUNTER — Inpatient Hospital Stay (HOSPITAL_BASED_OUTPATIENT_CLINIC_OR_DEPARTMENT_OTHER): Payer: Medicare Other | Admitting: Nurse Practitioner

## 2021-04-07 VITALS — BP 129/80 | HR 80 | Temp 98.0°F | Resp 18 | Ht 62.0 in | Wt 93.6 lb

## 2021-04-07 VITALS — BP 150/64 | HR 67

## 2021-04-07 DIAGNOSIS — R21 Rash and other nonspecific skin eruption: Secondary | ICD-10-CM | POA: Diagnosis not present

## 2021-04-07 DIAGNOSIS — Z87440 Personal history of urinary (tract) infections: Secondary | ICD-10-CM | POA: Diagnosis not present

## 2021-04-07 DIAGNOSIS — R590 Localized enlarged lymph nodes: Secondary | ICD-10-CM | POA: Diagnosis not present

## 2021-04-07 DIAGNOSIS — J189 Pneumonia, unspecified organism: Secondary | ICD-10-CM | POA: Diagnosis not present

## 2021-04-07 DIAGNOSIS — J440 Chronic obstructive pulmonary disease with acute lower respiratory infection: Secondary | ICD-10-CM | POA: Diagnosis not present

## 2021-04-07 DIAGNOSIS — Z79899 Other long term (current) drug therapy: Secondary | ICD-10-CM | POA: Insufficient documentation

## 2021-04-07 DIAGNOSIS — Z9981 Dependence on supplemental oxygen: Secondary | ICD-10-CM | POA: Diagnosis not present

## 2021-04-07 DIAGNOSIS — C3412 Malignant neoplasm of upper lobe, left bronchus or lung: Secondary | ICD-10-CM | POA: Insufficient documentation

## 2021-04-07 DIAGNOSIS — C7889 Secondary malignant neoplasm of other digestive organs: Secondary | ICD-10-CM | POA: Diagnosis not present

## 2021-04-07 DIAGNOSIS — Z5112 Encounter for antineoplastic immunotherapy: Secondary | ICD-10-CM | POA: Insufficient documentation

## 2021-04-07 DIAGNOSIS — Z86711 Personal history of pulmonary embolism: Secondary | ICD-10-CM | POA: Insufficient documentation

## 2021-04-07 DIAGNOSIS — I252 Old myocardial infarction: Secondary | ICD-10-CM | POA: Insufficient documentation

## 2021-04-07 DIAGNOSIS — Z5181 Encounter for therapeutic drug level monitoring: Secondary | ICD-10-CM

## 2021-04-07 DIAGNOSIS — R0989 Other specified symptoms and signs involving the circulatory and respiratory systems: Secondary | ICD-10-CM

## 2021-04-07 DIAGNOSIS — E7800 Pure hypercholesterolemia, unspecified: Secondary | ICD-10-CM

## 2021-04-07 LAB — CMP (CANCER CENTER ONLY)
ALT: 14 U/L (ref 0–44)
AST: 14 U/L — ABNORMAL LOW (ref 15–41)
Albumin: 4.3 g/dL (ref 3.5–5.0)
Alkaline Phosphatase: 44 U/L (ref 38–126)
Anion gap: 7 (ref 5–15)
BUN: 11 mg/dL (ref 8–23)
CO2: 28 mmol/L (ref 22–32)
Calcium: 9.6 mg/dL (ref 8.9–10.3)
Chloride: 102 mmol/L (ref 98–111)
Creatinine: 0.42 mg/dL — ABNORMAL LOW (ref 0.44–1.00)
GFR, Estimated: >60 mL/min (ref >60)
Glucose, Bld: 95 mg/dL (ref 70–99)
Potassium: 3.8 mmol/L (ref 3.5–5.1)
Sodium: 137 mmol/L (ref 135–145)
Total Bilirubin: 0.7 mg/dL (ref 0.3–1.2)
Total Protein: 6.6 g/dL (ref 6.5–8.1)

## 2021-04-07 MED ORDER — SODIUM CHLORIDE 0.9 % IV SOLN
200.0000 mg | Freq: Once | INTRAVENOUS | Status: AC
  Administered 2021-04-07: 200 mg via INTRAVENOUS
  Filled 2021-04-07: qty 8

## 2021-04-07 MED ORDER — SODIUM CHLORIDE 0.9 % IV SOLN: Freq: Once | INTRAVENOUS | Status: AC

## 2021-04-07 NOTE — Telephone Encounter (Signed)
Patient wanted to let D. Oval Linsey know that she had her Lipid panel drawn today at the [facility] lab. The lab tech told her that because all of their labs fo out to [facility], the results may not go directly into her Epic.

## 2021-04-07 NOTE — Telephone Encounter (Signed)
Called patient left message on personal voice mail I will send message to Dr.Woodfield's nurse to make sure Dr.[place] reviews lipid panel done today.

## 2021-04-07 NOTE — Progress Notes (Addendum)
[facility] OFFICE PROGRESS NOTE   Diagnosis: Non-small cell lung cancer  INTERVAL HISTORY:   Brooke [name] returns as scheduled.  She completed another cycle of Pembrolizumab 03/10/2021.  She denies nausea/vomiting.  No mouth sores.  No diarrhea.  No rash.  She reports a dry skin rash which is relieved with applying lotion.  She has a good appetite.  She has periodic anxiety.  She request a referral to psychology.  Objective:  Vital signs in last 24 hours:  Blood pressure 129/80, pulse 80, temperature 98 F (36.7 C), temperature source Oral, resp. rate 18, height '5\' 2"'  (1.575 m), weight 93 lb 9.6 oz (42.5 kg), SpO2 100 %.    HEENT: No thrush or ulcers. Resp: Distant breath sounds.  No respiratory distress. Cardio: Regular rate and rhythm. GI: Abdomen soft and nontender.  No hepatosplenomegaly. Vascular: No leg edema. Skin: No rash.   Lab Results:  Lab Results  Component Value Date   WBC 4.5 03/20/2021   HGB 12.9 03/20/2021   HCT 40.0 03/20/2021   MCV 89.7 03/20/2021   PLT 232.0 03/20/2021   NEUTROABS 3.4 03/20/2021    Imaging:  No results found.  Medications: I have reviewed the patient's current medications.  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% Left lung radiation 11/08/2016 through 11/21/2016 CT chest 12/12/2016-reexpansion of left upper lobe with a decreased left upper lobe mass, unchanged mediastinal adenopathy, progression of a splenic metastasis/pancreatic tail/gastrohepatic ligament metastasis, right lower lobe  pneumonia Cycle 1 Pembrolizumab 12/14/2016 Cycle 2 Pembrolizumab 01/03/2017 Cycle 3 Pembrolizumab 01/24/2017 Cycle 4 Pembrolizumab 02/12/2017 CT 03/05/2018-decrease in left upper lobe mass, mediastinal adenopathy, and splenic mass Cycle 5 pembrolizumab 03/07/2017 Cycle 6 Pembrolizumab 04/02/2017 Cycle 7 pembrolizumab 04/25/2017 Cycle 8 Pembrolizumab 05/14/2017 Cycle 9 Pembrolizumab 06/06/2017 CT 06/20/2017-mild decrease in left upper lobe mass, mild increase in paramediastinal left upper lobe nodule-radiation change?, decreased splenic metastasis Cycle 10 pembrolizumab 06/25/2017 Cycle 11 pembrolizumab 07/18/2017 Cycle 12 pembrolizumab 08/29/2017 Cycle 13 pembrolizumab 09/17/2017 Cycle 14 Pembrolizumab 10/10/2017 CT chest 10/24/2017- slight enlargement of small mediastinal lymph nodes, increased left upper lobe consolidation, decreased size of spleen lesion Cycle 15 pembrolizumab 10/29/2017 Cycle 16 Pembrolizumab 11/21/2017 Cycle 17 Pembrolizumab 12/11/2017 Cycle 18 pembrolizumab 01/02/2018 Cycle 19 Pembrolizumab 01/21/2018 Cycle 20 Pembrolizumab 02/13/2018 CT chest 02/27/2018- decreased left paratracheal node, progressive consolidation/fibrosis in the left upper lung, decreased size of splenic mass Cycle 21 pembrolizumab 03/04/2018 Cycle 22 Pembrolizumab 03/27/2018  Cycle 23 pembrolizumab 04/15/2018 Cycle 24 pembrolizumab 05/08/2018 Cycle 25 Pembrolizumab 05/27/2018 CT chest 06/16/2018- stable left upper lung radiation fibrosis with no evidence of local tumor recurrence.  Decreased left paratracheal adenopathy.  No new or progressive metastatic disease in the chest.  Gastrohepatic ligament lymphadenopathy stable.  Splenic metastasis decreased.  T5 vertebral compression fracture with associated patchy sclerosis and no discrete osseous lesion, new in the interval. Cycle 26 Pembrolizumab 06/19/2018 Cycle 27 pembrolizumab 07/08/2018 Cycle 28 pembrolizumab 07/31/2018 Cycle 29 pembrolizumab 08/19/2018 Cycle 30  Pembrolizumab 09/11/2018 Cycle 31 pembrolizumab 09/30/2018 Cycle 32 pembrolizumab 10/23/2018 CT chest 10/28/2018- left apical pleural-parenchymal opacity consistent with radiation scarring has become more confluent likely representing evolutionary change.  Continued further decrease in size of index left paratracheal  node and splenic metastasis.  Gastrohepatic ligament lymph node not changed. Cycle 33 Pembrolizumab 11/11/2018 Cycle 34 pembrolizumab 12/04/2018 Cycle 35 pembrolizumab 12/23/2018 CT chest 02/12/2019- posttreatment changes left upper lobe unchanged.  Continued decrease in size of splenic metastasis.  Stable appearance of left paratracheal lymph nodes.  Stable gastrohepatic adenopathy.  Incomplete visualization of retroperitoneal lymph nodes in the upper abdomen. CT chest 07/02/2019-stable post treatment related changes left lung.  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  Slight regression of enlarged likely metastatic gastrohepatic lymph node. CT chest 11/11/2019-interval enlargement of a right paratracheal lymph node measuring 14 mm, increased from 5 mm.  No new lung mass or nodularity.  New adenopathy in the upper abdomen.  2.7 cm lesion gastrohepatic ligament.  Necrotic node at the splenic hilum measures 4.7 cm.  Large node along the IVC left upper quadrant measures 2.7 cm.  Similar node left adjacent the aorta measures 1.8 cm.  Enhancing lesion in the spleen is unchanged.  Several low-density lesions in the liver are unchanged. Pembrolizumab resumed on a 3-week schedule 12/03/2019 CT chest 02/22/2020-interval resolution of previously demonstrated left paratracheal and upper abdominal adenopathy.  Stable treated metastasis within the spleen.  Stable radiation changes in the left lung with left hilar distortion.  No evidence of local recurrence or progressive metastatic disease. Continuation of pembrolizumab every 3 weeks 02/26/2020 CT chest 06/28/2020-no change in left upper lobe consolidation,  unchanged spleen metastasis, no evidence of progressive disease Pembrolizumab continued every 3 weeks 07/01/2020 CT chest 11/02/2020-stable posttreatment changes at the left hilum and left upper lobe, decreased size of spleen lesion, no evidence of disease progression Pembrolizumab every 3 weeks continued CT chest 01/12/2021-negative for acute pulmonary embolus.  Stable radiation fibrosis left upper lobe.  Stable low-density lesion in the spleen.  No new or progressive findings. Pembrolizumab every 3 weeks continued     2.  05/29/2019 laryngoscopy-exophytic appearing mass mid right vocal cord.   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  Tumor cells negative for p16, CMV and HSV 2.  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 Radiation 07/29/2019-08/25/2019   3. History of acute respiratory failure secondary to #1   4. Oxygen dependent COPD   5. History of a NSTEMI 2015   6. Right lung pneumonia on chest CT 12/12/2016-treated with Levaquin   7.  Grade 2 rash possibly related to Pembrolizumab.  Treatment held 08/06/2017.  Improved 08/29/2017, treatment resumed, rash has resolved   8.  Vertigo January 2022-improved with course of Augmentin for ear/sinus infection        Disposition: Brooke [name] appears stable.  There is no clinical evidence of disease progression.  Continue Pembrolizumab every 3 weeks.  Plan for restaging CTs in January.  Chemistry panel reviewed.  Adequate for treatment.  She is having periodic anxiety.  We made a referral to Dr. Michail Sermon.  She will return for lab, follow-up, Pembrolizumab in 3 weeks.  She will contact the office in the interim with any problems.  [name] ANP/GNP-BC   04/07/2021  9:10 AM

## 2021-04-07 NOTE — Telephone Encounter (Signed)
Patient seen by Lisa Thomas NP today  Vitals are within treatment parameters.  Labs reviewed by Lisa Thomas NP and are within treatment parameters.  Per physician team, patient is ready for treatment and there are NO modifications to the treatment plan.     

## 2021-04-07 NOTE — Telephone Encounter (Signed)
Printed labs for Dr Oval Linsey to review

## 2021-04-07 NOTE — Patient Instructions (Signed)
[facility]   Discharge Instructions: Thank you for choosing [facility] to provide your oncology and hematology care.   If you have a lab appointment with the [facility], please go directly to the [facility] and check in at the registration area.   Wear comfortable clothing and clothing appropriate for easy access to any Portacath or PICC line.   We strive to give you quality time with your provider. You may need to reschedule your appointment if you arrive late (15 or more minutes).  Arriving late affects you and other patients whose appointments are after yours.  Also, if you miss three or more appointments without notifying the office, you may be dismissed from the clinic at the provider's discretion.      For prescription refill requests, have your pharmacy contact our office and allow 72 hours for refills to be completed.    Today you received the following chemotherapy and/or immunotherapy agents Keytruda       To help prevent nausea and vomiting after your treatment, we encourage you to take your nausea medication as directed.  BELOW ARE SYMPTOMS THAT SHOULD BE REPORTED IMMEDIATELY: *FEVER GREATER THAN 100.4 F (38 C) OR HIGHER *CHILLS OR SWEATING *NAUSEA AND VOMITING THAT IS NOT CONTROLLED WITH YOUR NAUSEA MEDICATION *UNUSUAL SHORTNESS OF BREATH *UNUSUAL BRUISING OR BLEEDING *URINARY PROBLEMS (pain or burning when urinating, or frequent urination) *BOWEL PROBLEMS (unusual diarrhea, constipation, pain near the anus) TENDERNESS IN MOUTH AND THROAT WITH OR WITHOUT PRESENCE OF ULCERS (sore throat, sores in mouth, or a toothache) UNUSUAL RASH, SWELLING OR PAIN  UNUSUAL VAGINAL DISCHARGE OR ITCHING   Items with * indicate a potential emergency and should be followed up as soon as possible or go to the Emergency Department if any problems should occur.  Please show the CHEMOTHERAPY ALERT CARD or IMMUNOTHERAPY ALERT CARD at check-in to  the Emergency Department and triage nurse.  Should you have questions after your visit or need to cancel or reschedule your appointment, please contact [facility]  Dept: [phone]  and follow the prompts.  Office hours are 8:00 a.m. to 4:30 p.m. Monday - Friday. Please note that voicemails left after 4:00 p.m. may not be returned until the following business day.  We are closed weekends and major holidays. You have access to a nurse at all times for urgent questions. Please call the main number to the clinic Dept: [phone] and follow the prompts.   For any non-urgent questions, you may also contact your provider using MyChart. We now offer e-Visits for anyone 12 and older to request care online for non-urgent symptoms. For details visit [email or web address].   Also download the MyChart app! Go to the app store, search "MyChart", open the app, select [facility], and log in with your MyChart username and password.  Due to Covid, a mask is required upon entering the hospital/clinic. If you do not have a mask, one will be given to you upon arrival. For doctor visits, patients may have 1 support person aged 80 or older with them. For treatment visits, patients cannot have anyone with them due to current Covid guidelines and our immunocompromised population.   Pembrolizumab injection What is this medication? PEMBROLIZUMAB (pem broe liz ue mab) is a monoclonal antibody. It is used to treat certain types of cancer. This medicine may be used for other purposes; ask your health care provider or pharmacist if you have questions. COMMON BRAND NAME(S):  Keytruda What should I tell my care team before I take this medication? They need to know if you have any of these conditions: autoimmune diseases like Crohn's disease, ulcerative colitis, or lupus have had or planning to have an allogeneic stem cell transplant (uses someone else's stem cells) history of organ  transplant history of chest radiation nervous system problems like myasthenia gravis or Guillain-Barre syndrome an unusual or allergic reaction to pembrolizumab, other medicines, foods, dyes, or preservatives pregnant or trying to get pregnant breast-feeding How should I use this medication? This medicine is for infusion into a vein. It is given by a health care professional in a hospital or clinic setting. A special MedGuide will be given to you before each treatment. Be sure to read this information carefully each time. Talk to your pediatrician regarding the use of this medicine in children. While this drug may be prescribed for children as young as 6 months for selected conditions, precautions do apply. Overdosage: If you think you have taken too much of this medicine contact a poison control center or emergency room at once. NOTE: This medicine is only for you. Do not share this medicine with others. What if I miss a dose? It is important not to miss your dose. Call your doctor or health care professional if you are unable to keep an appointment. What may interact with this medication? Interactions have not been studied. This list may not describe all possible interactions. Give your health care provider a list of all the medicines, herbs, non-prescription drugs, or dietary supplements you use. Also tell them if you smoke, drink alcohol, or use illegal drugs. Some items may interact with your medicine. What should I watch for while using this medication? Your condition will be monitored carefully while you are receiving this medicine. You may need blood work done while you are taking this medicine. Do not become pregnant while taking this medicine or for 4 months after stopping it. Women should inform their doctor if they wish to become pregnant or think they might be pregnant. There is a potential for serious side effects to an unborn child. Talk to your health care professional or  pharmacist for more information. Do not breast-feed an infant while taking this medicine or for 4 months after the last dose. What side effects may I notice from receiving this medication? Side effects that you should report to your doctor or health care professional as soon as possible: allergic reactions like skin rash, itching or hives, swelling of the face, lips, or tongue bloody or black, tarry breathing problems changes in vision chest pain chills confusion constipation cough diarrhea dizziness or feeling faint or lightheaded fast or irregular heartbeat fever flushing joint pain low blood counts - this medicine may decrease the number of white blood cells, red blood cells and platelets. You may be at increased risk for infections and bleeding. muscle pain muscle weakness pain, tingling, numbness in the hands or feet persistent headache redness, blistering, peeling or loosening of the skin, including inside the mouth signs and symptoms of high blood sugar such as dizziness; dry mouth; dry skin; fruity breath; nausea; stomach pain; increased hunger or thirst; increased urination signs and symptoms of kidney injury like trouble passing urine or change in the amount of urine signs and symptoms of liver injury like dark urine, light-colored stools, loss of appetite, nausea, right upper belly pain, yellowing of the eyes or skin sweating swollen lymph nodes weight loss Side effects that usually do not  require medical attention (report to your doctor or health care professional if they continue or are bothersome): decreased appetite hair loss tiredness This list may not describe all possible side effects. Call your doctor for medical advice about side effects. You may report side effects to FDA at 1-800-FDA-1088. Where should I keep my medication? This drug is given in a hospital or clinic and will not be stored at home. NOTE: This sheet is a summary. It may not cover all possible  information. If you have questions about this medicine, talk to your doctor, pharmacist, or health care provider.  2022 Elsevier/Gold Standard (2021-01-10 00:00:00)

## 2021-04-07 NOTE — Progress Notes (Signed)
Patient presents for treatment. RN assessment completed along with the following:  Labs/vitals reviewed - Yes, and within treatment parameters.   Weight within 10% of previous measurement - Yes Oncology Treatment Attestation completed for current therapy- Yes, on date 01/27/21 Informed consent completed and reflects current therapy/intent - Yes, on date 12/14/16             Provider progress note reviewed - Yes, today's provider note was reviewed. Treatment/Antibody/Supportive plan reviewed - Yes, and there are no adjustments needed for today's treatment. S&H and other orders reviewed - Yes, and there are no additional orders identified. Previous treatment date reviewed - Yes, and the appropriate amount of time has elapsed between treatments. Clinic Hand Off Received from - Terri Skains, [place]  Patient to proceed with treatment.

## 2021-04-17 ENCOUNTER — Telehealth: Payer: Self-pay | Admitting: *Deleted

## 2021-04-17 NOTE — Telephone Encounter (Signed)
Called patient to f/u on status of referral to Dr. Michail Sermon (psych). She reports she has a telephone appointment with him next week.

## 2021-04-20 MED ORDER — ATORVASTATIN CALCIUM 20 MG PO TABS: 40.0000 mg | ORAL_TABLET | Freq: Every day | ORAL | 3 refills | Status: DC

## 2021-04-20 NOTE — Telephone Encounter (Signed)
Brooke Latch, MD  Earvin Hansen, LPN Labs reviewed.  LDL cholesterol is still little bit higher than it should be.  It was 101 and should be less than 70.  Recommend increasing her atorvastatin to 40 mg.  She is unable to swallow tablets larger than the 20 mg.  Recommend that she get to 20 mg tablets daily.  Repeat lipids and a CMP in 3 months  Advised patient She would like the 20 mg 2  tablets daily secondary to swallowing issues

## 2021-04-21 ENCOUNTER — Ambulatory Visit (INDEPENDENT_AMBULATORY_CARE_PROVIDER_SITE_OTHER): Payer: Medicare Other | Admitting: Psychologist

## 2021-04-21 DIAGNOSIS — F411 Generalized anxiety disorder: Secondary | ICD-10-CM

## 2021-04-21 NOTE — Plan of Care (Signed)
Goals Reduce overall frequency, intensity, and duration of anxiety Stabilize anxiety level wile increasing ability to function Enhance ability to effectively cope with full variety of stressors Learn and implement coping skills that result in a reduction of anxiety   Objectives Verbalize an understanding of the cognitive, physiological, and behavioral components of anxiety Learning and implement calming skills to reduce overall anxiety Verbalize an understanding of the role that cognitive biases play in excessive irrational worry and persistent anxiety symptoms Identify, challenge, and replace based fearful talk Learn and implement problem solving strategies Identify and engage in pleasant activities Learning and implement personal and interpersonal skills to reduce anxiety and improve interpersonal relationships Learn to accept limitations in life and commit to tolerating, rather than avoiding, unpleasant emotions while accomplishing meaningful goals Identify major life conflicts from the past and present that form the basis for present anxiety Maintain involvement in work, family, and social activities Reestablish a consistent sleep-wake cycle Cooperate with a medical evaluation  Interventions Engage the patient in behavioral activation Use instruction, modeling, and role-playing to build the client's general social, communication, and/or conflict resolution skills Use Acceptance and Commitment Therapy to help client accept uncomfortable realities in order to accomplish value-consistent goals Reinforce the client's insight into the role of his/her past emotional pain and present anxiety  Support the client in following through with work, family, and social activities Teach and implement sleep hygiene practices  Refer the patient to a physician for a psychotropic medication consultation Monito the clint's psychotropic medication compliance Discuss how anxiety typically involves excessive  worry, various bodily expressions of tension, and avoidance of what is threatening that interact to maintain the problem  Teach the patient relaxation skills Assign the patient homework Discuss examples demonstrating that unrealistic worry overestimates the probability of threats and underestimates patient's ability  Assist the patient in analyzing his or her worries Help patient understand that avoidance is reinforcing      Problem: Anxiety Disorder CCP Problem  1 alleviate distress from anxiety symptoms.  Goal: STG: Patient will participate in at least 80% of scheduled individual psychotherapy sessions Outcome: Not Progressing Goal: STG: Patient will complete at least 80% of assigned homework Outcome: Not Progressing

## 2021-04-21 NOTE — Progress Notes (Signed)
[facility] Counselor Initial Adult Exam  Name: Brooke [name] Date: 04/21/2021 MRN: [id number] DOB: 09-09-1945 PCP: Brooke Sacramento, MD  Time spent: 1:04 pm to 1:38 pm; Total Time: 34 minutes  This session was held via phone teletherapy due to the coronavirus risk at this time. The patient consented to phone teletherapy and was located at her home during this session. She is aware it is the responsibility of the patient to secure confidentiality on her end of the session. The provider was in a private home office for the duration of this session. Limits of confidentiality were discussed with the patient.   Guardian/Payee:  NA    Paperwork requested: No   Reason for Visit /Presenting Problem: Patient endorsed experiencing anxiety based symptoms.   Mental Status Exam: Appearance:   NA      Behavior:  Appropriate  Motor:  NA  Speech/Language:   Normal Rate  Affect:  Appropriate  Mood:  normal  Thought process:  normal  Thought content:    WNL  Sensory/Perceptual disturbances:    WNL  Orientation:  oriented to person, place, time/date, situation, and day of week  Attention:  Good  Concentration:  Good  Memory:  WNL  Fund of knowledge:   Good  Insight:    Fair  Judgment:   Fair  Impulse Control:  Good     Reported Symptoms:  Patient endorsed experiencing the following: racing thoughts, feeling on edge, feeling overwhelmed, feeling restless, difficulty controlling worries, shortness of breath, and heart palpitations. She denied suicidal and homicidal ideation.   Risk Assessment: Danger to Self:  No Self-injurious Behavior: No Danger to Others: No Duty to Warn:no Physical Aggression / Violence:No  Access to Firearms a concern: No  Gang Involvement:No  Patient / guardian was educated about steps to take if suicide or homicide risk level increases between visits: n/a While future psychiatric events cannot be accurately predicted, the patient does not  currently require acute inpatient psychiatric care and does not currently meet [facility] involuntary commitment criteria.  Substance Abuse History: Current substance abuse: No     Past Psychiatric History:   No previous psychological problems have been observed Outpatient Providers:NA History of Psych Hospitalization: No  Psychological Testing:  NA    Abuse History:  Victim of: No.,  NA    Report needed: No. Victim of Neglect:No. Perpetrator of  NA   Witness / Exposure to Domestic Violence: No   Protective Services Involvement: No  Witness to [employer] Violence:  No   Family History:  Family History  Problem Relation Age of Onset   Lung cancer Father    Heart attack Neg Hx    Stroke Neg Hx    Hypertension Neg Hx     Living situation: the patient lives alone  Sexual Orientation: Straight  Relationship Status: divorced  Name of spouse / other: Patient stated that she has a good relationship with her ex-husband (Brooke [name]).  If a parent, number of children / ages:NA  Support Systems: Patient indicated that her ex-husband is a good support system.   Financial Stress:  No   Income/Employment/Disability: Patient is currently retired.   Military Service: No   Educational History: Education: post Forensic psychologist work or degree  Religion/Sprituality/World View: Patient described herself as Engineer, manufacturing and indicated that, that is very important to her.   Any cultural differences that may affect / interfere with treatment:  NA  Recreation/Hobbies: Watching television  Stressors: Health problems    Strengths:  Supportive Relationships  Barriers:  Patient has a limited support system.    Legal History: Pending legal issue / charges: The patient has no significant history of legal issues. History of legal issue / charges:  NA  Medical History/Surgical History: reviewed Past Medical History:  Diagnosis Date   CAD in native artery 03/16/2021   Chronic respiratory  failure ([place])    a. on home O2.   COPD (chronic obstructive pulmonary disease) ([place])    a. Home O2.   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 Coronary vasospasm    Past Surgical History:  Procedure Laterality Date   LEFT HEART CATHETERIZATION WITH CORONARY ANGIOGRAM N/A 06/08/2013   Procedure: LEFT HEART CATHETERIZATION WITH CORONARY ANGIOGRAM;  Surgeon: Sanda Klein, MD;  Location: [facility] CATH LAB;  Service: Cardiovascular;  Laterality: N/A;    Medications: Current Outpatient Medications  Medication Sig Dispense Refill   acetaminophen (TYLENOL) 325 MG tablet Per bottle as needed     albuterol (PROVENTIL) (2.5 MG/3ML) 0.083% nebulizer solution Take 3 mLs (2.5 mg total) by nebulization every 6 (six) hours as needed for wheezing or shortness of breath. 75 mL 12   albuterol (VENTOLIN HFA) 108 (90 Base) MCG/ACT inhaler Inhale into the lungs.     ALPRAZolam (XANAX) 0.25 MG tablet 1/2-1 twice daily as needed  1   amLODipine (NORVASC) 2.5 MG tablet Take by mouth based on top number of BP. 1 tab (2.5mg ) if BP 140-159, 2 tabs (5mg ) for 160-179, 3 tabs (7.5 mg) for BP >180 180 tablet 3   atorvastatin (LIPITOR) 20 MG tablet Take 2 tablets (40 mg total) by mouth daily. NEEDS 20 MG TABLETS SECONDARY TO SWALLOWING ISSUES 180 tablet 3   budesonide-formoterol (SYMBICORT) 160-4.5 MCG/ACT inhaler Inhale 2 puffs into the lungs 2 (two) times daily.     guaiFENesin (MUCINEX) 600 MG 12 hr tablet Take 600 mg by mouth 2 (two) times daily as needed for cough or to loosen phlegm.     ipratropium-albuterol (DUONEB) 0.5-2.5 (3) MG/3ML SOLN Take 3 mLs by nebulization every 4 (four)  hours as needed (wheezing/shortness of breath). **PLAN C**     methylPREDNISolone (MEDROL) 4 MG tablet take  4 each am x 2 days,   2 each am x 2 days,  1 each am x 2 days and stop 14 tablet 11   nitroGLYCERIN (NITROSTAT) 0.4 MG SL tablet ONE TABLET UNDER TONGUE AS NEEDED FOR CHEST PAIN EVERY 5 MINUTES FOR 3 DOSES 25 tablet 3   NON FORMULARY [facility]  Onychomycosis Nail Lacquer -  Fluconazole 2%, Terbinafine 1% DMSO/undecylenic acid 25% Apply to affected nail once daily Qty. 120 gm 3 refills     OXYGEN 2lpm 24/7     Probiotic Product (PROBIOTIC ADVANCED PO) Take 1 tablet by mouth daily.     Simethicone 180 MG CAPS Use as needed     Current Facility-Administered Medications  Medication Dose Route Frequency Provider Last Rate Last Admin   methylPREDNISolone acetate (DEPO-MEDROL) injection 120 mg  120 mg Intramuscular Once Tanda Rockers, MD       methylPREDNISolone acetate (DEPO-MEDROL) injection 120 mg  120 mg Intramuscular Once Tanda Rockers, MD        Allergies  Allergen Reactions   Afrin [Oxymetazoline] Anxiety   Codeine Nausea And Vomiting   Imdur [Isosorbide Dinitrate]     headache   Prednisone Other (See Comments)    Reaction:Abnormal behavior; cannot take in pill form but CAN tolerate the injection   Pulmicort [Budesonide]     "feeling of torture"    Diagnoses:  F41.1 generalized anxiety disorder  Plan of Care: The patient is a 75 year old woman who was referred due to experiencing anxiety based symptoms. The patient lives alone. The patient meets criteria for a diagnosis of F41.1 generalized anxiety disorder based off of the following: racing thoughts, feeling on edge, feeling overwhelmed, feeling restless, difficulty controlling worries, shortness of breath, and heart palpitations. She denied suicidal and homicidal ideation.   The patient stated that she wanted coping skills.  This psychologist makes the recommendation that the patient participate in at least  monthly therapy. Depending on the severity of symptoms, patient would benefit from bi-weekly therapy to assist in decreasing level of distress from symptoms.    Conception Chancy, PsyD

## 2021-04-22 ENCOUNTER — Other Ambulatory Visit: Payer: Self-pay

## 2021-04-23 ENCOUNTER — Other Ambulatory Visit: Payer: Self-pay | Admitting: Oncology

## 2021-04-24 ENCOUNTER — Telehealth: Payer: Self-pay | Admitting: *Deleted

## 2021-04-24 ENCOUNTER — Telehealth: Payer: Self-pay | Admitting: Internal Medicine

## 2021-04-24 NOTE — Telephone Encounter (Signed)
I called Vickie back and gave her the provider recommendations. Nothing further needed.

## 2021-04-24 NOTE — Telephone Encounter (Signed)
I called and spoke with Jocelyn Lamer and she wants to know if Bactrim and Cipro would be better as far as less side effects for this patient lung disease. Patient wanted the recommendation of the lung doctor. I did try to have her check with PCP but they wanted lung doctor recommendation. Please advise.

## 2021-04-24 NOTE — Telephone Encounter (Signed)
Has UTI and PA wants to start Bactrim or Cipro. Patient insists she check with [name] if [place]. OK per [name] for either.

## 2021-04-25 ENCOUNTER — Encounter (HOSPITAL_BASED_OUTPATIENT_CLINIC_OR_DEPARTMENT_OTHER): Payer: Self-pay

## 2021-04-26 ENCOUNTER — Encounter (HOSPITAL_BASED_OUTPATIENT_CLINIC_OR_DEPARTMENT_OTHER): Payer: Self-pay

## 2021-04-27 ENCOUNTER — Other Ambulatory Visit (HOSPITAL_BASED_OUTPATIENT_CLINIC_OR_DEPARTMENT_OTHER): Payer: Self-pay

## 2021-04-27 ENCOUNTER — Ambulatory Visit: Payer: Medicare Other

## 2021-04-27 ENCOUNTER — Ambulatory Visit: Payer: Medicare Other | Admitting: Oncology

## 2021-04-27 ENCOUNTER — Other Ambulatory Visit: Payer: Medicare Other

## 2021-04-27 MED ORDER — ATORVASTATIN CALCIUM 20 MG PO TABS: 40.0000 mg | ORAL_TABLET | Freq: Every day | ORAL | 3 refills | Status: DC

## 2021-04-28 ENCOUNTER — Inpatient Hospital Stay: Payer: Medicare Other

## 2021-04-28 ENCOUNTER — Encounter: Payer: Self-pay | Admitting: Nurse Practitioner

## 2021-04-28 ENCOUNTER — Inpatient Hospital Stay (HOSPITAL_BASED_OUTPATIENT_CLINIC_OR_DEPARTMENT_OTHER): Payer: Medicare Other | Admitting: Nurse Practitioner

## 2021-04-28 ENCOUNTER — Other Ambulatory Visit: Payer: Self-pay

## 2021-04-28 VITALS — BP 122/83 | HR 85 | Temp 97.8°F | Resp 18 | Ht 62.0 in | Wt 90.8 lb

## 2021-04-28 VITALS — BP 117/60 | HR 82 | Temp 97.8°F | Resp 18

## 2021-04-28 DIAGNOSIS — C3412 Malignant neoplasm of upper lobe, left bronchus or lung: Secondary | ICD-10-CM

## 2021-04-28 LAB — TSH: TSH: 1.232 u[IU]/mL (ref 0.350–4.500)

## 2021-04-28 LAB — CMP (CANCER CENTER ONLY)
ALT: 25 U/L (ref 0–44)
AST: 23 U/L (ref 15–41)
Albumin: 4.5 g/dL (ref 3.5–5.0)
Alkaline Phosphatase: 60 U/L (ref 38–126)
Anion gap: 9 (ref 5–15)
BUN: 15 mg/dL (ref 8–23)
CO2: 28 mmol/L (ref 22–32)
Calcium: 9.5 mg/dL (ref 8.9–10.3)
Chloride: 97 mmol/L — ABNORMAL LOW (ref 98–111)
Creatinine: 0.53 mg/dL (ref 0.44–1.00)
GFR, Estimated: >60 mL/min (ref >60)
Glucose, Bld: 86 mg/dL (ref 70–99)
Potassium: 3.8 mmol/L (ref 3.5–5.1)
Sodium: 134 mmol/L — ABNORMAL LOW (ref 135–145)
Total Bilirubin: 0.5 mg/dL (ref 0.3–1.2)
Total Protein: 7.3 g/dL (ref 6.5–8.1)

## 2021-04-28 MED ORDER — SODIUM CHLORIDE 0.9 % IV SOLN
200.0000 mg | Freq: Once | INTRAVENOUS | Status: AC
  Administered 2021-04-28: 11:00:00 200 mg via INTRAVENOUS
  Filled 2021-04-28: qty 8

## 2021-04-28 MED ORDER — SODIUM CHLORIDE 0.9 % IV SOLN
Freq: Once | INTRAVENOUS | Status: AC
Start: 2021-04-28 — End: 2021-04-28

## 2021-04-28 NOTE — Progress Notes (Signed)
Patient seen by Lisa Thomas NP today  Vitals are within treatment parameters.  Labs reviewed by Lisa Thomas NP and are within treatment parameters.  Per physician team, patient is ready for treatment and there are NO modifications to the treatment plan.     

## 2021-04-28 NOTE — Patient Instructions (Signed)
Brooke [name]   Discharge Instructions: Thank you for choosing [facility] to provide your oncology and hematology care.   If you have a lab appointment with the [facility], please go directly to the [facility] and check in at the registration area.   Wear comfortable clothing and clothing appropriate for easy access to any Portacath or PICC line.   We strive to give you quality time with your provider. You may need to reschedule your appointment if you arrive late (15 or more minutes).  Arriving late affects you and other patients whose appointments are after yours.  Also, if you miss three or more appointments without notifying the office, you may be dismissed from the clinic at the providers discretion.      For prescription refill requests, have your pharmacy contact our office and allow 72 hours for refills to be completed.    Today you received the following chemotherapy and/or immunotherapy agents Keytruda      To help prevent nausea and vomiting after your treatment, we encourage you to take your nausea medication as directed.  BELOW ARE SYMPTOMS THAT SHOULD BE REPORTED IMMEDIATELY: *FEVER GREATER THAN 100.4 F (38 C) OR HIGHER *CHILLS OR SWEATING *NAUSEA AND VOMITING THAT IS NOT CONTROLLED WITH YOUR NAUSEA MEDICATION *UNUSUAL SHORTNESS OF BREATH *UNUSUAL BRUISING OR BLEEDING *URINARY PROBLEMS (pain or burning when urinating, or frequent urination) *BOWEL PROBLEMS (unusual diarrhea, constipation, pain near the anus) TENDERNESS IN MOUTH AND THROAT WITH OR WITHOUT PRESENCE OF ULCERS (sore throat, sores in mouth, or a toothache) UNUSUAL RASH, SWELLING OR PAIN  UNUSUAL VAGINAL DISCHARGE OR ITCHING   Items with * indicate a potential emergency and should be followed up as soon as possible or go to the Emergency Department if any problems should occur.  Please show the CHEMOTHERAPY ALERT CARD or IMMUNOTHERAPY ALERT CARD at check-in to the  Emergency Department and triage nurse.  Should you have questions after your visit or need to cancel or reschedule your appointment, please contact [facility]  Dept: [phone]  and follow the prompts.  Office hours are 8:00 a.m. to 4:30 p.m. Monday - Friday. Please note that voicemails left after 4:00 p.m. may not be returned until the following business day.  We are closed weekends and major holidays. You have access to a nurse at all times for urgent questions. Please call the main number to the clinic Dept: [phone] and follow the prompts.   For any non-urgent questions, you may also contact your provider using MyChart. We now offer e-Visits for anyone 24 and older to request care online for non-urgent symptoms. For details visit [email or web address].   Also download the MyChart app! Go to the app store, search "MyChart", open the app, select [facility], and log in with your MyChart username and password.  Due to Covid, a mask is required upon entering the hospital/clinic. If you do not have a mask, one will be given to you upon arrival. For doctor visits, patients may have 1 support person aged 37 or older with them. For treatment visits, patients cannot have anyone with them due to current Covid guidelines and our immunocompromised population.   Pembrolizumab injection What is this medication? PEMBROLIZUMAB (pem broe liz ue mab) is a monoclonal antibody. It is used to treat certain types of cancer. This medicine may be used for other purposes; ask your health care provider or pharmacist if you have questions. COMMON BRAND NAME(S): [employer]  What should I tell my care team before I take this medication? They need to know if you have any of these conditions: autoimmune diseases like Crohn's disease, ulcerative colitis, or lupus have had or planning to have an allogeneic stem cell transplant (uses someone else's stem cells) history of organ  transplant history of chest radiation nervous system problems like myasthenia gravis or Guillain-Barre syndrome an unusual or allergic reaction to pembrolizumab, other medicines, foods, dyes, or preservatives pregnant or trying to get pregnant breast-feeding How should I use this medication? This medicine is for infusion into a vein. It is given by a health care professional in a hospital or clinic setting. A special MedGuide will be given to you before each treatment. Be sure to read this information carefully each time. Talk to your pediatrician regarding the use of this medicine in children. While this drug may be prescribed for children as young as 6 months for selected conditions, precautions do apply. Overdosage: If you think you have taken too much of this medicine contact a poison control center or emergency room at once. NOTE: This medicine is only for you. Do not share this medicine with others. What if I miss a dose? It is important not to miss your dose. Call your doctor or health care professional if you are unable to keep an appointment. What may interact with this medication? Interactions have not been studied. This list may not describe all possible interactions. Give your health care provider a list of all the medicines, herbs, non-prescription drugs, or dietary supplements you use. Also tell them if you smoke, drink alcohol, or use illegal drugs. Some items may interact with your medicine. What should I watch for while using this medication? Your condition will be monitored carefully while you are receiving this medicine. You may need blood work done while you are taking this medicine. Do not become pregnant while taking this medicine or for 4 months after stopping it. Women should inform their doctor if they wish to become pregnant or think they might be pregnant. There is a potential for serious side effects to an unborn child. Talk to your health care professional or  pharmacist for more information. Do not breast-feed an infant while taking this medicine or for 4 months after the last dose. What side effects may I notice from receiving this medication? Side effects that you should report to your doctor or health care professional as soon as possible: allergic reactions like skin rash, itching or hives, swelling of the face, lips, or tongue bloody or black, tarry breathing problems changes in vision chest pain chills confusion constipation cough diarrhea dizziness or feeling faint or lightheaded fast or irregular heartbeat fever flushing joint pain low blood counts - this medicine may decrease the number of white blood cells, red blood cells and platelets. You may be at increased risk for infections and bleeding. muscle pain muscle weakness pain, tingling, numbness in the hands or feet persistent headache redness, blistering, peeling or loosening of the skin, including inside the mouth signs and symptoms of high blood sugar such as dizziness; dry mouth; dry skin; fruity breath; nausea; stomach pain; increased hunger or thirst; increased urination signs and symptoms of kidney injury like trouble passing urine or change in the amount of urine signs and symptoms of liver injury like dark urine, light-colored stools, loss of appetite, nausea, right upper belly pain, yellowing of the eyes or skin sweating swollen lymph nodes weight loss Side effects that usually do not require  medical attention (report to your doctor or health care professional if they continue or are bothersome): decreased appetite hair loss tiredness This list may not describe all possible side effects. Call your doctor for medical advice about side effects. You may report side effects to FDA at 1-800-FDA-1088. Where should I keep my medication? This drug is given in a hospital or clinic and will not be stored at home. NOTE: This sheet is a summary. It may not cover all possible  information. If you have questions about this medicine, talk to your doctor, pharmacist, or health care provider.  2022 Elsevier/Gold Standard (2021-01-10 00:00:00)

## 2021-04-28 NOTE — Progress Notes (Signed)
Patient presents for treatment. RN assessment completed along with the following:  Labs/vitals reviewed - Yes, and within treatment parameters.   Weight within 10% of previous measurement - Yes Oncology Treatment Attestation completed for current therapy- no and Dr sent text message Informed consent completed and reflects current therapy/intent - Yes, on date 12/14/16             Provider progress note reviewed - Today's provider note is not yet available. I reviewed the most recent oncology provider progress note in chart dated 04/07/21. Treatment/Antibody/Supportive plan reviewed - Yes, and there are no adjustments needed for today's treatment. S&H and other orders reviewed - Yes, and there are no additional orders identified. Previous treatment date reviewed - Yes, and the appropriate amount of time has elapsed between treatments. Clinic Hand Off Received from - Leander Rams, NP  Patient to proceed with treatment.

## 2021-04-28 NOTE — Progress Notes (Signed)
[place] OFFICE PROGRESS NOTE   Diagnosis: Non-small cell lung cancer  INTERVAL HISTORY:   Brooke [name] returns as scheduled.  She completed another cycle of Pembrolizumab 04/07/2021.  No rash or diarrhea.  She reports a recent urinary tract infection.  Initially treated with Septra which she did not tolerate well, reports hallucinations and marked decrease in appetite.  She began Augmentin yesterday.  She reports recent mid back pain after pushing a cart at the store.  Pain is improving.  Objective:  Vital signs in last 24 hours:  Blood pressure 122/83, pulse 85, temperature 97.8 F (36.6 C), temperature source Oral, resp. rate 18, height '5\' 2"'  (1.575 m), weight 90 lb 12.8 oz (41.2 kg), SpO2 100 %.    HEENT: No thrush or ulcers. Resp: Distant breath sounds.  No respiratory distress. Cardio: Regular rate and rhythm. GI: Abdomen soft and nontender.  No hepatosplenomegaly. Musculoskeletal: Mild tenderness left mid back. Vascular: No leg edema. Skin: No rash.   Lab Results:  Lab Results  Component Value Date   WBC 4.5 03/20/2021   HGB 12.9 03/20/2021   HCT 40.0 03/20/2021   MCV 89.7 03/20/2021   PLT 232.0 03/20/2021   NEUTROABS 3.4 03/20/2021    Imaging:  No results found.  Medications: I have reviewed the patient's current medications.  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% Left lung radiation 11/08/2016 through 11/21/2016 CT chest 12/12/2016-reexpansion of left upper lobe with a decreased left upper lobe mass, unchanged  mediastinal adenopathy, progression of a splenic metastasis/pancreatic tail/gastrohepatic ligament metastasis, right lower lobe pneumonia Cycle 1 Pembrolizumab 12/14/2016 Cycle 2 Pembrolizumab 01/03/2017 Cycle 3 Pembrolizumab 01/24/2017 Cycle 4 Pembrolizumab 02/12/2017 CT 03/05/2018-decrease in left upper lobe mass, mediastinal adenopathy, and splenic mass Cycle 5 pembrolizumab 03/07/2017 Cycle 6 Pembrolizumab 04/02/2017 Cycle 7 pembrolizumab 04/25/2017 Cycle 8 Pembrolizumab 05/14/2017 Cycle 9 Pembrolizumab 06/06/2017 CT 06/20/2017-mild decrease in left upper lobe mass, mild increase in paramediastinal left upper lobe nodule-radiation change?, decreased splenic metastasis Cycle 10 pembrolizumab 06/25/2017 Cycle 11 pembrolizumab 07/18/2017 Cycle 12 pembrolizumab 08/29/2017 Cycle 13 pembrolizumab 09/17/2017 Cycle 14 Pembrolizumab 10/10/2017 CT chest 10/24/2017- slight enlargement of small mediastinal lymph nodes, increased left upper lobe consolidation, decreased size of spleen lesion Cycle 15 pembrolizumab 10/29/2017 Cycle 16 Pembrolizumab 11/21/2017 Cycle 17 Pembrolizumab 12/11/2017 Cycle 18 pembrolizumab 01/02/2018 Cycle 19 Pembrolizumab 01/21/2018 Cycle 20 Pembrolizumab 02/13/2018 CT chest 02/27/2018- decreased left paratracheal node, progressive consolidation/fibrosis in the left upper lung, decreased size of splenic mass Cycle 21 pembrolizumab 03/04/2018 Cycle 22 Pembrolizumab 03/27/2018  Cycle 23 pembrolizumab 04/15/2018 Cycle 24 pembrolizumab 05/08/2018 Cycle 25 Pembrolizumab 05/27/2018 CT chest 06/16/2018- stable left upper lung radiation fibrosis with no evidence of local tumor recurrence.  Decreased left paratracheal adenopathy.  No new or progressive metastatic disease in the chest.  Gastrohepatic ligament lymphadenopathy stable.  Splenic metastasis decreased.  T5 vertebral compression fracture with associated patchy sclerosis and no discrete osseous lesion, new in the interval. Cycle 26  Pembrolizumab 06/19/2018 Cycle 27 pembrolizumab 07/08/2018 Cycle 28 pembrolizumab 07/31/2018 Cycle 29 pembrolizumab 08/19/2018 Cycle 30 Pembrolizumab 09/11/2018 Cycle 31 pembrolizumab 09/30/2018 Cycle 32 pembrolizumab 10/23/2018 CT chest 10/28/2018- left apical pleural-parenchymal opacity consistent with radiation scarring has become more  confluent likely representing evolutionary change.  Continued further decrease in size of index left paratracheal node and splenic metastasis.  Gastrohepatic ligament lymph node not changed. Cycle 33 Pembrolizumab 11/11/2018 Cycle 34 pembrolizumab 12/04/2018 Cycle 35 pembrolizumab 12/23/2018 CT chest 02/12/2019- posttreatment changes left upper lobe unchanged.  Continued decrease in size of splenic metastasis.  Stable appearance of left paratracheal lymph nodes.  Stable gastrohepatic adenopathy.  Incomplete visualization of retroperitoneal lymph nodes in the upper abdomen. CT chest 07/02/2019-stable post treatment related changes left lung.  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  Slight regression of enlarged likely metastatic gastrohepatic lymph node. CT chest 11/11/2019-interval enlargement of a right paratracheal lymph node measuring 14 mm, increased from 5 mm.  No new lung mass or nodularity.  New adenopathy in the upper abdomen.  2.7 cm lesion gastrohepatic ligament.  Necrotic node at the splenic hilum measures 4.7 cm.  Large node along the IVC left upper quadrant measures 2.7 cm.  Similar node left adjacent the aorta measures 1.8 cm.  Enhancing lesion in the spleen is unchanged.  Several low-density lesions in the liver are unchanged. Pembrolizumab resumed on a 3-week schedule 12/03/2019 CT chest 02/22/2020-interval resolution of previously demonstrated left paratracheal and upper abdominal adenopathy.  Stable treated metastasis within the spleen.  Stable radiation changes in the left lung with left hilar distortion.  No evidence of local recurrence or progressive metastatic  disease. Continuation of pembrolizumab every 3 weeks 02/26/2020 CT chest 06/28/2020-no change in left upper lobe consolidation, unchanged spleen metastasis, no evidence of progressive disease Pembrolizumab continued every 3 weeks 07/01/2020 CT chest 11/02/2020-stable posttreatment changes at the left hilum and left upper lobe, decreased size of spleen lesion, no evidence of disease progression Pembrolizumab every 3 weeks continued CT chest 01/12/2021-negative for acute pulmonary embolus.  Stable radiation fibrosis left upper lobe.  Stable low-density lesion in the spleen.  No new or progressive findings. Pembrolizumab every 3 weeks continued     2.  05/29/2019 laryngoscopy-exophytic appearing mass mid right vocal cord.   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  Tumor cells negative for p16, CMV and HSV 2.  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 Radiation 07/29/2019-08/25/2019   3. History of acute respiratory failure secondary to #1   4. Oxygen dependent COPD   5. History of a NSTEMI 2015   6. Right lung pneumonia on chest CT 12/12/2016-treated with Levaquin   7.  Grade 2 rash possibly related to Pembrolizumab.  Treatment held 08/06/2017.  Improved 08/29/2017, treatment resumed, rash has resolved   8.  Vertigo January 2022-improved with course of Augmentin for ear/sinus infection    Disposition: Brooke [name] appears unchanged.  Plan to proceed with Pembrolizumab today as scheduled.  Restaging chest CT prior to next office visit.  Chemistry panel from today reviewed.  Labs adequate to proceed with treatment.  We discussed the weight loss.  She thinks the decrease in appetite was related to Septra.  Appetite has improved since it was discontinued.  She will return for follow-up in 3 weeks.  We are available to see her sooner if needed.    [name] ANP/GNP-BC   04/28/2021  9:39 AM

## 2021-05-02 ENCOUNTER — Other Ambulatory Visit (HOSPITAL_BASED_OUTPATIENT_CLINIC_OR_DEPARTMENT_OTHER): Payer: Self-pay

## 2021-05-02 ENCOUNTER — Telehealth: Payer: Self-pay | Admitting: *Deleted

## 2021-05-02 ENCOUNTER — Telehealth (HOSPITAL_BASED_OUTPATIENT_CLINIC_OR_DEPARTMENT_OTHER): Payer: Self-pay | Admitting: Cardiovascular Disease

## 2021-05-02 MED ORDER — ATORVASTATIN CALCIUM 20 MG PO TABS: 40.0000 mg | ORAL_TABLET | Freq: Every day | ORAL | 3 refills | Status: DC

## 2021-05-02 NOTE — Telephone Encounter (Signed)
Called Brooke [name] with CT scan appointment a [facility] on 05/17/21 at 0845/0900. Liquids only for 4 hours prior. She requested [facility] transport be arranged for this as well as her appointments here on 05/19/21 and 06/09/21. She needs door to door pick up.  Scheduler notified.

## 2021-05-02 NOTE — Telephone Encounter (Signed)
Pt c/o medication issue:  1. Name of Medication: atorvastatin (LIPITOR) 20 MG tablet  2. How are you currently taking this medication (dosage and times per day)? Take 2 tablets (40 mg total) by mouth daily. NEEDS 20 MG TABLETS SECONDARY TO SWALLOWING ISSUES  3. Are you having a reaction (difficulty breathing--STAT)? no  4. What is your medication issue? Pharmacy calling to say that we need to get in contact with the patient insurance. Because they are covering with 1 tablet a day not two. Pharmacy saying that our office need to get a pre authorization from patient insurance

## 2021-05-03 NOTE — Telephone Encounter (Signed)
Hey this patient called in about her medication just FYI, I know we talked about it! I will call her and let her know that we are working on it!

## 2021-05-03 NOTE — Telephone Encounter (Signed)
Called tried start via phone but they have to fax form  Will complete once received

## 2021-05-03 NOTE — Telephone Encounter (Signed)
Called patient and spoke with her and reassured her that we are working with the insurance company to get her medication approved!

## 2021-05-10 ENCOUNTER — Telehealth: Payer: Self-pay | Admitting: Cardiovascular Disease

## 2021-05-10 NOTE — Telephone Encounter (Signed)
Patient called and said that she has throat cancer and cannot swallow the 40 mg tablet of atorvastatin. Needs to take the 20 mg due to lack in size but insurance will not pay for medication. Wants to know what to do next. Please call patient

## 2021-05-10 NOTE — Telephone Encounter (Signed)
Advised patient and left message at [facility]

## 2021-05-10 NOTE — Telephone Encounter (Signed)
We can attempt a PA for two 20mg  tablets once daily

## 2021-05-10 NOTE — Telephone Encounter (Signed)
Rollen Sox, [facility] at 05/10/2021  4:09 PM  Status: Signed  Pa for two atorvastatin 20mg  tablets approved.   Your request has been approved Effective from 05/10/2021 through 05/10/2022.       Advised patient and called [facility]

## 2021-05-10 NOTE — Telephone Encounter (Signed)
Spoke with pt. She state she has throat cancer and is unable to swallow atorvastatin 40 mg.Pt state she is able to tolerate 20 mg tablets, however her insurance won't cover two 20 mg tablet daily. She state they will only cover 1 1/2 tablet daily to total 30 mg. Pt seeking advice.

## 2021-05-10 NOTE — Telephone Encounter (Signed)
PA submitted.  Key: Elmore Guise

## 2021-05-10 NOTE — Telephone Encounter (Signed)
Pa for two atorvastatin 20mg  tablets approved.  Your request has been approved Effective from 05/10/2021 through 05/10/2022.

## 2021-05-11 ENCOUNTER — Other Ambulatory Visit: Payer: Self-pay

## 2021-05-11 ENCOUNTER — Ambulatory Visit: Payer: Medicare Other | Admitting: Internal Medicine

## 2021-05-11 ENCOUNTER — Encounter: Payer: Self-pay | Admitting: Internal Medicine

## 2021-05-11 VITALS — BP 104/64 | HR 83 | Temp 98.5°F | Ht 62.0 in | Wt 92.6 lb

## 2021-05-11 DIAGNOSIS — R490 Dysphonia: Secondary | ICD-10-CM | POA: Diagnosis not present

## 2021-05-11 DIAGNOSIS — R918 Other nonspecific abnormal finding of lung field: Secondary | ICD-10-CM | POA: Diagnosis not present

## 2021-05-11 DIAGNOSIS — J449 Chronic obstructive pulmonary disease, unspecified: Secondary | ICD-10-CM

## 2021-05-11 DIAGNOSIS — J9611 Chronic respiratory failure with hypoxia: Secondary | ICD-10-CM

## 2021-05-11 NOTE — Patient Instructions (Addendum)
Try prilosec otc 20mg   Take 30-60 min before first meal of the day and Pepcid ac (famotidine) 20 mg one @  bedtime until cough is completely gone for at least a week without the need for cough suppression  GERD (REFLUX)  is an extremely common cause of respiratory symptoms just like yours , many times with no obvious heartburn at all.    It can be treated with medication, but also with lifestyle changes including elevation of the head of your bed (ideally with 6-8inch blocks under the headboard of your bed),  Smoking cessation, avoidance of late meals, excessive alcohol, and avoid fatty foods, chocolate, peppermint, colas, red wine, and acidic juices such as orange juice.  NO MINT OR MENTHOL PRODUCTS SO NO COUGH DROPS  USE SUGARLESS CANDY INSTEAD (Jolley ranchers or Stover's or Life Savers) or even ice chips will also do - the key is to swallow to prevent all throat clearing. NO OIL BASED VITAMINS - use powdered substitutes.  Avoid fish oil when coughing.   Change your mucinex to mucinex dm 1200 mg twice daily as needed plus flutter valve   Make sure you check your oxygen saturation  AT  your highest level of activity (not after you stop)   to be sure it stays over 90% and adjust  02 flow upward to maintain this level if needed but remember to turn it back to previous settings when you stop (to conserve your supply).   Please schedule a follow up visit in 6 months but call sooner if needed

## 2021-05-11 NOTE — Assessment & Plan Note (Signed)
See CT Chest 01/26/16 c/w ? Stage  lung ca arising in LUL - PET 02/28/16 1. Large lobular hypermetabolic mass in the LEFT upper lobe abutting the oblique fissure consists with primary bronchogenic carcinoma. 2. Evidence of ipsilateral hypermetabolic mediastinal nodal Metastasis only > 02/29/2016  referred for EBUS/ Dr [place] Bells > declined  -11/01/16   CT directed bx  Pos [place] LUL  > RT palliative completed 11/21/16 / rx per oncology = Keytruda  - CT 02/27/18 fibrosis of LUL / nodes smaller - CT 02/12/2019 Post treatment changes in left upper lobe unchanged from previous Exam. Continued decrease in size of splenic metastasis. Stable appearance of left paratracheal lymph nodes. Stable gastrohepatic adenopathy with incomplete visualization of retroperitoneal lymph nodes in the upper abdomen.  Plan f/u 05/17/21 with CT chest and oncology to follow    Each maintenance medication was reviewed in detail including emphasizing most importantly the difference between maintenance and prns and under what circumstances the prns are to be triggered using an action plan format where appropriate.  Total time for H and P, chart review, counseling, reviewing hfa/neb/02 device(s) , directly observing portions of ambulatory 02 saturation study/ and generating customized AVS unique to this office visit / same day charting =  32 min

## 2021-05-11 NOTE — Assessment & Plan Note (Addendum)
Rx 2lpm hs and prn daytime since 2014  -  06/13/2018   Walked on 2lpm x  3 laps @  approx 226ft each @ nl pace  stopped due to  Sob after each lap  s desat   - 05/11/2021   Walked on RA  x  1  lap(s) =  approx 250  ft  @ avg pace, stopped due to sob/ back pain with lowest 02 sats 95%    Advised: Make sure you check your oxygen saturation  AT  your highest level of activity (not after you stop)   to be sure it stays over 90% and adjust  02 flow upward to maintain this level if needed but remember to turn it back to previous settings when you stop (to conserve your supply).

## 2021-05-11 NOTE — Assessment & Plan Note (Addendum)
Quit smoking 2009 Spirometry 02/15/2016  FEV1 0.60 (28%)  Ratio 38 with classic curvature  p am symbicort 160  - 01/08/2017    continue dulera 200 2bid  - 12/09/2017  After extensive coaching inhaler device  effectiveness =    90% with smi >add spiriva to  symbicort  - 12/12/2017 informed could not tol spiriva due to dry mouth - - 06/13/2018   continue symbicort 160 - 01/18/2020  After extensive coaching inhaler device,  effectiveness =  90%  - 06/06/2020 changed to breztri  Referred to pulmonary rehab 08/22/20  > has not heard from rehab as of 09/12/2020    - 03/20/2021  After extensive coaching inhaler device,  effectiveness =    75%  - 03/20/21      alpha one AT phenotype  MM 161   - Added flutter valve 05/11/2021   Flare of sob assoc with UACS but the copd component is well compensated and has not actually implemented any of the contingency rx per [employer] action plan/ reviewed.

## 2021-05-11 NOTE — Assessment & Plan Note (Addendum)
11/30/220 televisit, new hoarse rec  gerd rx > ENT F/u > sq cell ca vc   Last RT April 20th 2021 in [place] since last ov assoc with cough/ globus and pseudowheeze demonstrated today typical of Upper airway cough syndrome (previously labeled PNDS),  is so named because it's frequently impossible to sort out how much is  CR/sinusitis with freq throat clearing (which can be related to primary GERD)   vs  causing  secondary (" extra esophageal")  GERD from wide swings in gastric pressure that occur with throat clearing, often  promoting self use of mint and menthol lozenges that reduce the lower esophageal sphincter tone and exacerbate the problem further in a cyclical fashion.   These are the same pts (now being labeled as having "irritable larynx syndrome" by some cough centers) who not infrequently have a history of having failed to tolerate ace inhibitors,  dry powder inhalers or biphosphonates or report having atypical/extraesophageal reflux symptoms that don't respond to standard doses of PPI  and are easily confused as having aecopd or asthma flares by even experienced allergists/ pulmonologists (myself included).   rec Max rx for GERD F/u ent as planned  Added flutter valve 05/11/2021

## 2021-05-11 NOTE — Progress Notes (Signed)
Subjective:    Patient ID: Brooke [name], female   DOB: 11-07-1945,    MRN: [id number]     Brief patient profile:  59 yowf quit smoking 2009 on 02 noct 2012 maint on symbicort 160 since aorund  2014 referred to pulmonary clinic 02/15/2016 by Dr   Kathryne Eriksson for RUL Lung mass dx with Stage IV nsc 11/01/16 in setting of GOLD IV copd      History of Present Illness  02/15/2016 [facility] office visit/ Brooke [name]  GOLD IV copd/ symb 160 2bid maint  Chief Complaint  Patient presents with   Pulmonary Consult    referred by Dr. Kathryne Eriksson for COPD.  MMRC2 = can't walk a nl pace on a flat grade s sob but does fine slow and flat eg [employer] on 2lpm  New onset bloody mucus plugs November 11 2015 assoc with wt loss  02 2lpm hs and with walking / rare saba needed rec For cough > mucinex up to 1200 mg every 12 hours as needed Rec:  Fob but refused   11/01/16   CT directed bx >>>  Pos NSC LUL    Diagnosis:   Stage IV NSCLC, Squamous cell carcinoma of the left upper lobe.      Indication for treatment:  palliative        Radiation treatment dates:   11/08/2016 to 11/21/2016   Site/dose:   The Left lung was treated to 30 Gy in 10 fractions at 3 Gy per fraction.    Beams/energy:   3D // 10X, 6X    12/06/2016  f/u ov/Brooke [name] re: post hosp f/u -  Now on dulera 200 bid and duoneb Chief Complaint  Patient presents with   HFU    Breathing is doing well. She is using Duoneb 2 x daily and uses albuterol inhaler 1-2 x per wk on average.   presently in SNF ever since last admit 6/18-29/18 for  Active Problems:   COPD  GOLD IV    Mass of upper lobe of left lung   Chronic respiratory failure with hypoxia (HCC)   COPD exacerbation (HCC)   Hypokalemia   Microcytic anemia   SOB (shortness of breath)   Protein-calorie malnutrition, severe Doe = 2lpm x 137ft with rolling walking  Sleeping ok at < 30 degrees Doe = 2lpm x 14ft with rolling walker/2lpm Sleeping ok at < 30 degrees   rec 02 can be adjusted down and off for saturations above 90%       12/09/2017  f/u ov/Brooke [name] re: GOLD IV copd/  No 02 at rest/ 2lpm sleep and with activity  Chief Complaint  Patient presents with   Follow-up    Breathing is overall doing well. She has occ cough with white sputum- relates to PND.  She uses her albuterol inhaler 2 x daily on average. She states she uses neb before she knows she is going somewhere.   Dyspnea:  MMRC2 = can't walk a nl pace on a flat grade s sob but does fine slow and flat eg still pushing cart  Cough: min pnd  SABA use: as above  rec Continue  symbicort and add stiolto 2 pffs each am - - if you don't like it for any reason resume the symbicort alone as per your med calendar  Please schedule a follow up visit in 3 months but call sooner if needed  - add:  Pt notified above typo should have said add spiriva x  2 pffs each am and continue symbicort Take 2 puffs first thing in am and then another 2 puffs about 12 hours later.   - 12/12/2017 informed could not tol spiriva due to dry mouth   03/14/2018  f/u ov/Brooke [name] re:  GOLD IV / 02 is 2lpm  Hs and 2lpm exertion/   RA sitting / just on symbicort 160 2bid  Chief Complaint  Patient presents with   Follow-up    increased cough with white sputum. She states her breathing has been doing well. She is using her albuterol inhaler 2 x per wk and neb maybe 2 x  per wk on average.    Dyspnea:  [employer] ok pushing cart on 2lpm  Cough: more since 2 weeks  Sleeping: flat bed/ 2-3 pillow  SABA use: avg as above 02: as above   rec Work on inhaler technique:   Ok to take augmentin unless it makes you nauseated  Please schedule a follow up visit in 3 months but call sooner if needed      06/13/2018  f/u ov/Brooke [name] re: GOLD IV / 02 hs and prn  Chief Complaint  Patient presents with   Shortness of Breath    Worse since the last visit.  Dyspnea:  Still doing [employer]  On 2lpm but not checking sats as rec with activity  Cough:  dry worse day than noct Sleeping: falt bed / couple of pillows SABA use: helps some / uses once a day esp in am 02: 2lpm hs and prn  rec Plan A = Automatic = symbicort 160 Take 2 puffs first thing in am and then another 2 puffs about 12 hours later.  Work on inhaler technique: Plan B = Backup Only use your albuterol inhaler as a rescue medication Plan C = Crisis - only use your albuterol/ipatropium nebulizer if you first try Plan B and it fails to help > ok to use the nebulizer up to every 4 hours but if start needing it regularly call for immediate appointment Please schedule a follow up visit in 3 months but call sooner if needed  - add chart review does not show alpha one screen done     11/30/220 televisit, new hoarse rec  gerd rx > ENT F/u > sq cell ca vc    Last RT April 20th 2021 in [place]    06/06/2020  f/u ov/Brooke [name] re:  GOLD IV / 02 dep  Chief Complaint  Patient presents with   Follow-up    Sob same, vertigo, did not start Breztri yet   Dyspnea:  Still doing 60 ft circuit x 30 most days   Slowed by "asthma feeling" on symbicort  Cough: very hoarse, no excess mucus but some pnds  Sleeping:  Bed is flat and 2 pillows  SABA use: both hfa and nebs but never pre or rechallenges   02: 2lpm 24/7 does not check sats at peak ex rec Try albuterol 15 min before an activity that you know would make you short of breath and see if it makes any difference and if makes none then don't take it after activity unless you can't catch your breath. Start Breztri 2 in am and symbicort x 2 pffs 12 hours later - once you finish the symbicort, use breztri just like you did the symbicort  Make sure you check your oxygen saturation  at your highest level of activity     Referred to pulmonary rehab 08/22/20  > has not  heard from rehab as of 09/12/2020     09/12/2020  f/u ov/Brooke [name] re: GOLD IV / 02 dep  Chief Complaint  Patient presents with   Follow-up    Breathing has improved some on  Breztri. Her cough has been more prod over the past few days- yellow to white sputum.  She is using her albuterol inhaler and neb with albuterol both 1-2 x per day.   Dyspnea:  Still doing 60 ft circuit  X 30 not really sure it helps  Cough: was yellow now but still thick p recent augmentin rx  Sleeping: bed is flat and has 2 pillows no resp problems  SABA use: as above but  none noct  02: 2lpm  Covid status:  vax x 3   Rec Depomedrol 120 mg IM today No change other  medications Make sure you check your oxygen saturation  at your highest level of activity    03/20/2021  f/u ov/Brooke [name] re: GOLD 4/ 02 dep  maint on symbicort 160 2bid  /need alpha one screen/    Chief Complaint  Patient presents with   Follow-up    COPD  Dyspnea:  100 ft not checking sats  Cough: back  to yellow again - was some better p amox Sleeping: 45 degrees  SABA use: using hfa and duoneb  02: 1- 2lpm 24/7 and drops  Covid status:   3 vax  Rec Depomedrol 120 mg  Plan A = Automatic = Always=    Symbicort 160 Take 2 puffs first thing in am and then another 2 puffs about 12 hours later.  Work on inhaler technique:  Plan B = Backup (to supplement plan A, not to replace it) Only use your albuterol inhaler as a rescue medication Plan C = Crisis (instead of Plan B but only if Plan B stops working) - only use your albuterol/ipatroprium nebulizer if you first try Plan B and it fails to help > ok to use the nebulizer up to every 4 hours but if start needing it regularly call for immediate appointment Plan D =  Depomerol 120 mg IM  or at home medrol 4 mg  x  4 x 2 days, 2 x days and 1 x 2days and off For cough > guaifensin up to 1200 mg every 12 hours and use the flutter as much as possible  Try prilosec otc 20mg   Take 30-60 min before first meal of the day and Pepcid ac (famotidine) 20 mg one @  bedtime until cough is completely gone for at least a week without the need for cough suppression Make sure you check your oxygen  saturation  AT  your highest level of activity (not after you stop)      05/11/2021  f/u ov/Brooke [name] re: Girtha Rm 4/02 dep    maint on symb 160  / did not start gerd rx as rec  Chief Complaint  Patient presents with   Follow-up    Breathing is "different" c/o cough- non prod    Dyspnea:  100 ft baseline / not checking sats or titrating 02 to > 90 as rec  Cough: worse since last ov occ produces very small yellowish  lump No Better with abx / has not tried medrol  or increased saba to see if they help. Sleeping: flat bed  lots of pillows s resp   SABA use: no increase  02: 2lpm hs and not at rest  Covid status:   vax x 3   No  obvious day to day or daytime variability or assoc excess/ purulent sputum or mucus plugs or hemoptysis or cp or chest tightness, subjective wheeze or overt sinus or hb symptoms.   Sleeping as above  without nocturnal  or early am exacerbation  of respiratory  c/o's or need for noct saba. Also denies any obvious fluctuation of symptoms with weather or environmental changes or other aggravating or alleviating factors except as outlined above   No unusual exposure hx or h/o childhood pna/ asthma or knowledge of premature birth.  Current Allergies, Complete Past Medical History, Past Surgical History, Family History, and Social History were reviewed in [employer] record.  ROS  The following are not active complaints unless bolded Hoarseness, sore throat, dysphagia, dental problems, itching, sneezing,  nasal congestion or discharge of excess mucus or purulent secretions, ear ache,   fever, chills, sweats, unintended wt loss or wt gain, classically pleuritic or exertional cp,  orthopnea pnd or arm/hand swelling  or leg swelling, presyncope, palpitations, abdominal pain, anorexia, nausea, vomiting, diarrhea  or change in bowel habits or change in bladder habits, change in stools or change in urine, dysuria, hematuria,  rash, arthralgias, visual complaints,  headache, numbness, weakness or ataxia or problems with walking or coordination,  change in mood or  memory.        Current Meds  Medication Sig   acetaminophen (TYLENOL) 325 MG tablet Per bottle as needed   albuterol (VENTOLIN HFA) 108 (90 Base) MCG/ACT inhaler Inhale into the lungs.   ALPRAZolam (XANAX) 0.25 MG tablet 1/2-1 twice daily as needed   amLODipine (NORVASC) 2.5 MG tablet Take by mouth based on top number of BP. 1 tab (2.5mg ) if BP 140-159, 2 tabs (5mg ) for 160-179, 3 tabs (7.5 mg) for BP >180   atorvastatin (LIPITOR) 20 MG tablet Take 2 tablets (40 mg total) by mouth daily. NEEDS 20 MG TABLETS SECONDARY TO SWALLOWING ISSUES   budesonide-formoterol (SYMBICORT) 160-4.5 MCG/ACT inhaler Inhale 2 puffs into the lungs 2 (two) times daily.   guaiFENesin (MUCINEX) 600 MG 12 hr tablet Take 600 mg by mouth 2 (two) times daily as needed for cough or to loosen phlegm.   ipratropium-albuterol (DUONEB) 0.5-2.5 (3) MG/3ML SOLN Take 3 mLs by nebulization every 4 (four) hours as needed (wheezing/shortness of breath). **PLAN C**   nitroGLYCERIN (NITROSTAT) 0.4 MG SL tablet ONE TABLET UNDER TONGUE AS NEEDED FOR CHEST PAIN EVERY 5 MINUTES FOR 3 DOSES   NON FORMULARY [facility]  Onychomycosis Nail Lacquer -  Fluconazole 2%, Terbinafine 1% DMSO/undecylenic acid 25% Apply to affected nail once daily Qty. 120 gm 3 refills   OXYGEN 2lpm 24/7   Probiotic Product (PROBIOTIC ADVANCED PO) Take 1 tablet by mouth daily.   Simethicone 180 MG CAPS Use as needed   Current Facility-Administered Medications for the 05/11/21 encounter (Office Visit) with Tanda Rockers, MD  Medication   methylPREDNISolone acetate (DEPO-MEDROL) injection 120 mg   methylPREDNISolone acetate (DEPO-MEDROL) injection 120 mg           Objective:   Physical Exam  Wts  05/11/2021        92 03/20/2021    96   09/12/2020         96 06/06/2020        100 01/18/2020          92 10/16/2019          93  03/05/2019      99   06/13/2018  123  03/14/2018       127  12/09/2017         125   06/10/2017         119  01/08/2017         112   12/06/2016        110   02/15/16 119 lb (54 kg)  08/18/15 137 lb 12.8 oz (62.5 kg)  02/17/15 138 lb 1.9 oz (62.7 kg)     Vital signs reviewed  05/11/2021  - Note at rest 02 sats  95% on RA   General appearance:    chronically hoarse amb wf with prominent pseudowheeze, worse with walking  HEENT : pt wearing mask not removed for exam due to covid -19 concerns.    NECK :  without JVD/Nodes/TM/ nl carotid upstrokes bilaterally   LUNGS: no acc muscle use,  Mod barrel  contour chest wall with bilateral  Distant bs s audible wheeze and  without cough on insp or exp maneuvers and mod  Hyperresonant  to  percussion bilaterally     CV:  RRR  no s3 or murmur or increase in P2, and no edema   ABD:  soft and nontender with pos mid insp Hoover's  in the supine position. No bruits or organomegaly appreciated, bowel sounds nl  MS:     ext warm without deformities, calf tenderness, cyanosis or clubbing No obvious joint restrictions   SKIN: warm and dry without lesions    NEURO:  alert, approp, nl sensorium with  no motor or cerebellar deficits apparent.              Assessment:

## 2021-05-14 ENCOUNTER — Other Ambulatory Visit: Payer: Self-pay | Admitting: Oncology

## 2021-05-15 ENCOUNTER — Ambulatory Visit: Payer: Medicare Other | Admitting: Psychologist

## 2021-05-17 ENCOUNTER — Encounter (HOSPITAL_BASED_OUTPATIENT_CLINIC_OR_DEPARTMENT_OTHER): Payer: Self-pay

## 2021-05-17 ENCOUNTER — Other Ambulatory Visit: Payer: Self-pay

## 2021-05-17 ENCOUNTER — Ambulatory Visit (HOSPITAL_BASED_OUTPATIENT_CLINIC_OR_DEPARTMENT_OTHER)
Admission: RE | Admit: 2021-05-17 | Discharge: 2021-05-17 | Disposition: A | Discharge status: Home / Self Care | Payer: Medicare Other | Source: Ambulatory Visit | Attending: Nurse Practitioner | Admitting: Nurse Practitioner

## 2021-05-17 DIAGNOSIS — C3412 Malignant neoplasm of upper lobe, left bronchus or lung: Secondary | ICD-10-CM | POA: Diagnosis not present

## 2021-05-17 MED ORDER — IOHEXOL 300 MG/ML  SOLN
60.0000 mL | Freq: Once | INTRAMUSCULAR | Status: AC | PRN
  Administered 2021-05-17: 60 mL via INTRAVENOUS

## 2021-05-19 ENCOUNTER — Inpatient Hospital Stay: Payer: Medicare Other | Attending: Oncology

## 2021-05-19 ENCOUNTER — Inpatient Hospital Stay: Payer: Medicare Other

## 2021-05-19 ENCOUNTER — Inpatient Hospital Stay (HOSPITAL_BASED_OUTPATIENT_CLINIC_OR_DEPARTMENT_OTHER): Payer: Medicare Other | Admitting: Oncology

## 2021-05-19 ENCOUNTER — Other Ambulatory Visit: Payer: Self-pay

## 2021-05-19 VITALS — BP 149/85 | HR 82 | Temp 97.9°F | Resp 18

## 2021-05-19 VITALS — BP 150/86 | HR 99 | Temp 97.8°F | Resp 18 | Ht 62.0 in | Wt 93.0 lb

## 2021-05-19 DIAGNOSIS — Z86711 Personal history of pulmonary embolism: Secondary | ICD-10-CM | POA: Insufficient documentation

## 2021-05-19 DIAGNOSIS — I700 Atherosclerosis of aorta: Secondary | ICD-10-CM | POA: Insufficient documentation

## 2021-05-19 DIAGNOSIS — Z9981 Dependence on supplemental oxygen: Secondary | ICD-10-CM | POA: Insufficient documentation

## 2021-05-19 DIAGNOSIS — C7889 Secondary malignant neoplasm of other digestive organs: Secondary | ICD-10-CM | POA: Insufficient documentation

## 2021-05-19 DIAGNOSIS — I252 Old myocardial infarction: Secondary | ICD-10-CM | POA: Diagnosis not present

## 2021-05-19 DIAGNOSIS — Z79899 Other long term (current) drug therapy: Secondary | ICD-10-CM | POA: Diagnosis not present

## 2021-05-19 DIAGNOSIS — C3412 Malignant neoplasm of upper lobe, left bronchus or lung: Secondary | ICD-10-CM

## 2021-05-19 DIAGNOSIS — Z5112 Encounter for antineoplastic immunotherapy: Secondary | ICD-10-CM | POA: Insufficient documentation

## 2021-05-19 DIAGNOSIS — K769 Liver disease, unspecified: Secondary | ICD-10-CM | POA: Diagnosis not present

## 2021-05-19 DIAGNOSIS — J432 Centrilobular emphysema: Secondary | ICD-10-CM | POA: Diagnosis not present

## 2021-05-19 DIAGNOSIS — R21 Rash and other nonspecific skin eruption: Secondary | ICD-10-CM | POA: Insufficient documentation

## 2021-05-19 DIAGNOSIS — I2510 Atherosclerotic heart disease of native coronary artery without angina pectoris: Secondary | ICD-10-CM | POA: Insufficient documentation

## 2021-05-19 DIAGNOSIS — Z85118 Personal history of other malignant neoplasm of bronchus and lung: Secondary | ICD-10-CM | POA: Insufficient documentation

## 2021-05-19 LAB — CMP (CANCER CENTER ONLY)
ALT: 17 U/L (ref 0–44)
AST: 18 U/L (ref 15–41)
Albumin: 4.3 g/dL (ref 3.5–5.0)
Alkaline Phosphatase: 61 U/L (ref 38–126)
Anion gap: 7 (ref 5–15)
BUN: 11 mg/dL (ref 8–23)
CO2: 29 mmol/L (ref 22–32)
Calcium: 9.4 mg/dL (ref 8.9–10.3)
Chloride: 98 mmol/L (ref 98–111)
Creatinine: 0.52 mg/dL (ref 0.44–1.00)
GFR, Estimated: >60 mL/min (ref >60)
Glucose, Bld: 95 mg/dL (ref 70–99)
Potassium: 3.8 mmol/L (ref 3.5–5.1)
Sodium: 134 mmol/L — ABNORMAL LOW (ref 135–145)
Total Bilirubin: 0.4 mg/dL (ref 0.3–1.2)
Total Protein: 6.9 g/dL (ref 6.5–8.1)

## 2021-05-19 LAB — CBC WITH DIFFERENTIAL (CANCER CENTER ONLY)
Abs Immature Granulocytes: 0.01 10*3/uL (ref 0.00–0.07)
Basophils Absolute: 0 10*3/uL (ref 0.0–0.1)
Basophils Relative: 1 %
Eosinophils Absolute: 0.1 10*3/uL (ref 0.0–0.5)
Eosinophils Relative: 2 %
HCT: 40.2 % (ref 36.0–46.0)
Hemoglobin: 12.9 g/dL (ref 12.0–15.0)
Immature Granulocytes: 0 %
Lymphocytes Relative: 19 %
Lymphs Abs: 0.8 10*3/uL (ref 0.7–4.0)
MCH: 28.9 pg (ref 26.0–34.0)
MCHC: 32.1 g/dL (ref 30.0–36.0)
MCV: 89.9 fL (ref 80.0–100.0)
Monocytes Absolute: 0.3 10*3/uL (ref 0.1–1.0)
Monocytes Relative: 7 %
Neutro Abs: 3 10*3/uL (ref 1.7–7.7)
Neutrophils Relative %: 71 %
Platelet Count: 200 10*3/uL (ref 150–400)
RBC: 4.47 MIL/uL (ref 3.87–5.11)
RDW: 13.3 % (ref 11.5–15.5)
WBC Count: 4.2 10*3/uL (ref 4.0–10.5)
nRBC: 0 % (ref 0.0–0.2)

## 2021-05-19 MED ORDER — SODIUM CHLORIDE 0.9 % IV SOLN
200.0000 mg | Freq: Once | INTRAVENOUS | Status: AC
  Administered 2021-05-19: 200 mg via INTRAVENOUS
  Filled 2021-05-19: qty 8

## 2021-05-19 MED ORDER — SODIUM CHLORIDE 0.9 % IV SOLN: Freq: Once | INTRAVENOUS | Status: AC

## 2021-05-19 NOTE — Progress Notes (Signed)
Patient presents for treatment. RN assessment completed along with the following:  Labs/vitals reviewed - Yes, and within treatment parameters.   Weight within 10% of previous measurement - Yes Informed consent completed and reflects current therapy/intent - Yes, on date 02/21/17             Provider progress note reviewed - Yes, today's provider note was reviewed. Treatment/Antibody/Supportive plan reviewed - Yes, and there are no adjustments needed for today's treatment. S&H and other orders reviewed - Yes, and there are no additional orders identified. Previous treatment date reviewed - Yes, and the appropriate amount of time has elapsed between treatments.  Patient to proceed with treatment.

## 2021-05-19 NOTE — Progress Notes (Signed)
[facility] OFFICE PROGRESS NOTE   Diagnosis: Lung cancer, COPD  INTERVAL HISTORY:   Brooke [name] was last treated with pembrolizumab on 04/28/2021.  No rash or diarrhea.  She reports increased dyspnea for the past 3 weeks.  She has an intermittent cough.  She is using inhaler and nebulizer treatment.  She uses oxygen.  She has increased back pain.  Good appetite.  Objective:  Vital signs in last 24 hours:  Blood pressure (!) 150/86, pulse 99, temperature 97.8 F (36.6 C), temperature source Oral, resp. rate 18, height _0  (1.575 m), weight 93 lb (42.2 kg).     Lymphatics: No cervical, supraclavicular, or axillary nodes Resp: Bronchial sounds at the left upper chest, no respiratory distress Cardio: Regular rate and rhythm GI: No mass, no hepatosplenomegaly Vascular: No leg edema Musculoskeletal: No spine tenderness  Lab Results:  Lab Results  Component Value Date   WBC 4.2 05/19/2021   HGB 12.9 05/19/2021   HCT 40.2 05/19/2021   MCV 89.9 05/19/2021   PLT 200 05/19/2021   NEUTROABS 3.0 05/19/2021    CMP  Lab Results  Component Value Date   NA 134 (L) 05/19/2021   K 3.8 05/19/2021   CL 98 05/19/2021   CO2 29 05/19/2021   GLUCOSE 95 05/19/2021   BUN 11 05/19/2021   CREATININE 0.52 05/19/2021   CALCIUM 9.4 05/19/2021   PROT 6.9 05/19/2021   ALBUMIN 4.3 05/19/2021   AST 18 05/19/2021   ALT 17 05/19/2021   ALKPHOS 61 05/19/2021   BILITOT 0.4 05/19/2021   GFRNONAA >60 05/19/2021   GFRAA >60 02/05/2020    No results found for: CEA1, CEA, CAN199, CA125  Lab Results  Component Value Date   INR 0.99 11/01/2016   LABPROT 13.1 11/01/2016    Imaging:  CT Chest W Contrast  Result Date: 05/17/2021 CLINICAL DATA:  76 year old female with history of lung cancer diagnosed 4 years ago. Restaging examination. EXAM: CT CHEST WITH CONTRAST TECHNIQUE: Multidetector CT imaging of the chest was performed during intravenous contrast administration.  RADIATION DOSE REDUCTION: This exam was performed according to the departmental dose-optimization program which includes automated exposure control, adjustment of the mA and/or kV according to patient size and/or use of iterative reconstruction technique. CONTRAST:  21m OMNIPAQUE IOHEXOL 300 MG/ML  SOLN COMPARISON:  Chest CT 01/12/2021. FINDINGS: Cardiovascular: Heart size is normal. Small amount of pericardial fluid and/or thickening. No definite pericardial calcifications. There is aortic atherosclerosis, as well as atherosclerosis of the great vessels of the mediastinum and the coronary arteries, including calcified atherosclerotic plaque in the left main, left anterior descending, left circumflex and right coronary arteries. Mediastinum/Nodes: No pathologically enlarged mediastinal or hilar lymph nodes. Esophagus is unremarkable in appearance. Lungs/Pleura: Chronic volume loss and architectural distortion in the apex of the left upper lobe, and to a lesser extent in the superior segment of the left lower lobe, compatible with chronic postradiation mass-like fibrosis. No definite new suspicious appearing pulmonary nodules or masses are noted. No acute consolidative airspace disease. No pleural effusions. Diffuse bronchial wall thickening with moderate centrilobular and paraseptal emphysema. Upper Abdomen: Aortic atherosclerosis. Low-attenuation lesions scattered throughout the liver, compatible with cysts, largest of which is in segment 3 of the liver measuring 3.2 x 2.0 cm. Musculoskeletal: Chronic appearing compression fracture of T6 with 40% loss of anterior vertebral body height is unchanged. New compression fracture of superior endplate of T8 with 396%loss of anterior vertebral body height, but with no paravertebral soft tissue  swelling, presumably late subacute or early chronic. There are no aggressive appearing lytic or blastic lesions noted in the visualized portions of the skeleton. IMPRESSION: 1.  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 No findings to suggest local recurrence of disease or definite metastatic disease in the thorax. 2. Diffuse bronchial wall thickening with moderate centrilobular and paraseptal emphysema; imaging findings suggestive of underlying COPD. 3. Aortic atherosclerosis, in addition to left main and three-vessel coronary artery disease. Assessment for potential risk factor modification, dietary therapy or pharmacologic therapy may be warranted, if clinically indicated. 4. Compression fractures of T6 and T8, new (but nonacute) at T8, as discussed above. 5. Additional incidental findings, as above. Aortic Atherosclerosis (ICD10-I70.0) and Emphysema (ICD10-J43.9). Electronically Signed   By: Vinnie Langton M.D.   On: 05/17/2021 11:18    Medications: I have reviewed the patient's current medications.   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% Left lung radiation 11/08/2016 through 11/21/2016 CT chest 12/12/2016-reexpansion of left upper lobe with a decreased left upper lobe mass, unchanged mediastinal adenopathy, progression of a splenic metastasis/pancreatic tail/gastrohepatic ligament metastasis, right lower lobe pneumonia Cycle 1 Pembrolizumab 12/14/2016 Cycle 2 Pembrolizumab 01/03/2017 Cycle 3 Pembrolizumab 01/24/2017 Cycle 4 Pembrolizumab 02/12/2017 CT 03/05/2018-decrease in left upper lobe mass, mediastinal adenopathy, and splenic mass Cycle 5 pembrolizumab 03/07/2017 Cycle 6  Pembrolizumab 04/02/2017 Cycle 7 pembrolizumab 04/25/2017 Cycle 8 Pembrolizumab 05/14/2017 Cycle 9 Pembrolizumab 06/06/2017 CT 06/20/2017-mild decrease in left upper lobe mass, mild increase in paramediastinal left upper lobe nodule-radiation change?, decreased splenic metastasis Cycle 10 pembrolizumab 06/25/2017 Cycle 11 pembrolizumab 07/18/2017 Cycle 12 pembrolizumab 08/29/2017 Cycle 13 pembrolizumab 09/17/2017 Cycle 14 Pembrolizumab 10/10/2017 CT chest 10/24/2017- slight enlargement of small mediastinal lymph nodes, increased left upper lobe consolidation, decreased size of spleen lesion Cycle 15 pembrolizumab 10/29/2017 Cycle 16 Pembrolizumab 11/21/2017 Cycle 17 Pembrolizumab 12/11/2017 Cycle 18 pembrolizumab 01/02/2018 Cycle 19 Pembrolizumab 01/21/2018 Cycle 20 Pembrolizumab 02/13/2018 CT chest 02/27/2018- decreased left paratracheal node, progressive consolidation/fibrosis in the left upper lung, decreased size of splenic mass Cycle 21 pembrolizumab 03/04/2018 Cycle 22 Pembrolizumab 03/27/2018  Cycle 23 pembrolizumab 04/15/2018 Cycle 24 pembrolizumab 05/08/2018 Cycle 25 Pembrolizumab 05/27/2018 CT chest 06/16/2018- stable left upper lung radiation fibrosis with no evidence of local tumor recurrence.  Decreased left paratracheal adenopathy.  No new or progressive metastatic disease in the chest.  Gastrohepatic ligament lymphadenopathy stable.  Splenic metastasis decreased.  T5 vertebral compression fracture with associated patchy sclerosis and no discrete osseous lesion, new in the interval. Cycle 26 Pembrolizumab 06/19/2018 Cycle 27 pembrolizumab 07/08/2018 Cycle 28 pembrolizumab 07/31/2018 Cycle 29 pembrolizumab 08/19/2018 Cycle 30 Pembrolizumab 09/11/2018 Cycle 31 pembrolizumab 09/30/2018 Cycle 32 pembrolizumab 10/23/2018 CT chest 10/28/2018- left apical pleural-parenchymal opacity consistent with radiation scarring has become more confluent likely representing evolutionary change.  Continued further  decrease in size of index left paratracheal node and splenic metastasis.  Gastrohepatic ligament lymph node not changed. Cycle 33 Pembrolizumab 11/11/2018 Cycle 34 pembrolizumab 12/04/2018 Cycle 35 pembrolizumab 12/23/2018 CT chest 02/12/2019- posttreatment changes left upper lobe unchanged.  Continued decrease in size of splenic metastasis.  Stable appearance of left paratracheal lymph nodes.  Stable gastrohepatic adenopathy.  Incomplete visualization of  retroperitoneal lymph nodes in the upper abdomen. CT chest 07/02/2019-stable post treatment related changes left lung.  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  Slight regression of enlarged likely metastatic gastrohepatic lymph node. CT chest 11/11/2019-interval enlargement of a right paratracheal lymph node measuring 14 mm, increased from 5 mm.  No new lung mass or nodularity.  New adenopathy in the upper abdomen.  2.7 cm lesion gastrohepatic ligament.  Necrotic node at the splenic hilum measures 4.7 cm.  Large node along the IVC left upper quadrant measures 2.7 cm.  Similar node left adjacent the aorta measures 1.8 cm.  Enhancing lesion in the spleen is unchanged.  Several low-density lesions in the liver are unchanged. Pembrolizumab resumed on a 3-week schedule 12/03/2019 CT chest 02/22/2020-interval resolution of previously demonstrated left paratracheal and upper abdominal adenopathy.  Stable treated metastasis within the spleen.  Stable radiation changes in the left lung with left hilar distortion.  No evidence of local recurrence or progressive metastatic disease. Continuation of pembrolizumab every 3 weeks 02/26/2020 CT chest 06/28/2020-no change in left upper lobe consolidation, unchanged spleen metastasis, no evidence of progressive disease Pembrolizumab continued every 3 weeks 07/01/2020 CT chest 11/02/2020-stable posttreatment changes at the left hilum and left upper lobe, decreased size of spleen lesion, no evidence of disease  progression Pembrolizumab every 3 weeks continued CT chest 01/12/2021-negative for acute pulmonary embolus.  Stable radiation fibrosis left upper lobe.  Stable low-density lesion in the spleen.  No new or progressive findings. Pembrolizumab every 3 weeks continued CT chest 05/17/2021-unchanged T6 compression fracture, new compression fracture at T8 with 30% loss of vertebral body height, chronic postradiation fibrosis in the upper left lung, no evidence of recurrent or metastatic disease in the chest     2.  05/29/2019 laryngoscopy-exophytic appearing mass mid right vocal cord.   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  Tumor cells negative for p16, CMV and HSV 2.  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 Radiation 07/29/2019-08/25/2019   3. History of acute respiratory failure secondary to #1   4. Oxygen dependent COPD   5. History of a NSTEMI 2015   6. Right lung pneumonia on chest CT 12/12/2016-treated with Levaquin   7.  Grade 2 rash possibly related to Pembrolizumab.  Treatment held 08/06/2017.  Improved 08/29/2017, treatment resumed, rash has resolved   8.  Vertigo January 2022-improved with course of Augmentin for ear/sinus infection     Disposition: Brooke [name] continues treatment pembrolizumab.  She is tolerating pembrolizumab well.  The restaging CT reveals no evidence of progressive lung cancer.  I will ask for a repeat reading for specific measurement of the splenic metastasis.  The lesion is not larger by my measurement.  The back pain may be related to the new compression fracture.  I suspect compression fracture is an osteoporotic fracture.  She reports she has not had a bone density scan in many years.  We will schedule a repeat bone density scan.  She will call if the back pain persist and we will consider an MRI of the spine.  I have a low clinical suspicion for metastatic lung cancer involving the spine.  Ms. Stirn will  return for an office visit and pembrolizumab in 3 weeks   Betsy Coder, MD  05/19/2021  10:12 AM

## 2021-05-19 NOTE — Patient Instructions (Signed)
[facility]   Discharge Instructions: Thank you for choosing [facility] to provide your oncology and hematology care.   If you have a lab appointment with the [facility], please go directly to the [facility] and check in at the registration area.   Wear comfortable clothing and clothing appropriate for easy access to any Portacath or PICC line.   We strive to give you quality time with your provider. You may need to reschedule your appointment if you arrive late (15 or more minutes).  Arriving late affects you and other patients whose appointments are after yours.  Also, if you miss three or more appointments without notifying the office, you may be dismissed from the clinic at the providers discretion.      For prescription refill requests, have your pharmacy contact our office and allow 72 hours for refills to be completed.    Today you received the following chemotherapy and/or immunotherapy agents Keytruda      To help prevent nausea and vomiting after your treatment, we encourage you to take your nausea medication as directed.  BELOW ARE SYMPTOMS THAT SHOULD BE REPORTED IMMEDIATELY: *FEVER GREATER THAN 100.4 F (38 C) OR HIGHER *CHILLS OR SWEATING *NAUSEA AND VOMITING THAT IS NOT CONTROLLED WITH YOUR NAUSEA MEDICATION *UNUSUAL SHORTNESS OF BREATH *UNUSUAL BRUISING OR BLEEDING *URINARY PROBLEMS (pain or burning when urinating, or frequent urination) *BOWEL PROBLEMS (unusual diarrhea, constipation, pain near the anus) TENDERNESS IN MOUTH AND THROAT WITH OR WITHOUT PRESENCE OF ULCERS (sore throat, sores in mouth, or a toothache) UNUSUAL RASH, SWELLING OR PAIN  UNUSUAL VAGINAL DISCHARGE OR ITCHING   Items with * indicate a potential emergency and should be followed up as soon as possible or go to the Emergency Department if any problems should occur.  Please show the CHEMOTHERAPY ALERT CARD or IMMUNOTHERAPY ALERT CARD at check-in to the  Emergency Department and triage nurse.  Should you have questions after your visit or need to cancel or reschedule your appointment, please contact [facility]  Dept: [phone]  and follow the prompts.  Office hours are 8:00 a.m. to 4:30 p.m. Monday - Friday. Please note that voicemails left after 4:00 p.m. may not be returned until the following business day.  We are closed weekends and major holidays. You have access to a nurse at all times for urgent questions. Please call the main number to the clinic Dept: [phone] and follow the prompts.   For any non-urgent questions, you may also contact your provider using MyChart. We now offer e-Visits for anyone 16 and older to request care online for non-urgent symptoms. For details visit [email or web address].   Also download the MyChart app! Go to the app store, search "MyChart", open the app, select [facility], and log in with your MyChart username and password.  Due to Covid, a mask is required upon entering the hospital/clinic. If you do not have a mask, one will be given to you upon arrival. For doctor visits, patients may have 1 support person aged 81 or older with them. For treatment visits, patients cannot have anyone with them due to current Covid guidelines and our immunocompromised population.   Pembrolizumab injection What is this medication? PEMBROLIZUMAB (pem broe liz ue mab) is a monoclonal antibody. It is used to treat certain types of cancer. This medicine may be used for other purposes; ask your health care provider or pharmacist if you have questions. COMMON BRAND NAME(S): [employer]  What should I tell my care team before I take this medication? They need to know if you have any of these conditions: autoimmune diseases like Crohn's disease, ulcerative colitis, or lupus have had or planning to have an allogeneic stem cell transplant (uses someone else's stem cells) history of organ  transplant history of chest radiation nervous system problems like myasthenia gravis or Guillain-Barre syndrome an unusual or allergic reaction to pembrolizumab, other medicines, foods, dyes, or preservatives pregnant or trying to get pregnant breast-feeding How should I use this medication? This medicine is for infusion into a vein. It is given by a health care professional in a hospital or clinic setting. A special MedGuide will be given to you before each treatment. Be sure to read this information carefully each time. Talk to your pediatrician regarding the use of this medicine in children. While this drug may be prescribed for children as young as 6 months for selected conditions, precautions do apply. Overdosage: If you think you have taken too much of this medicine contact a poison control center or emergency room at once. NOTE: This medicine is only for you. Do not share this medicine with others. What if I miss a dose? It is important not to miss your dose. Call your doctor or health care professional if you are unable to keep an appointment. What may interact with this medication? Interactions have not been studied. This list may not describe all possible interactions. Give your health care provider a list of all the medicines, herbs, non-prescription drugs, or dietary supplements you use. Also tell them if you smoke, drink alcohol, or use illegal drugs. Some items may interact with your medicine. What should I watch for while using this medication? Your condition will be monitored carefully while you are receiving this medicine. You may need blood work done while you are taking this medicine. Do not become pregnant while taking this medicine or for 4 months after stopping it. Women should inform their doctor if they wish to become pregnant or think they might be pregnant. There is a potential for serious side effects to an unborn child. Talk to your health care professional or  pharmacist for more information. Do not breast-feed an infant while taking this medicine or for 4 months after the last dose. What side effects may I notice from receiving this medication? Side effects that you should report to your doctor or health care professional as soon as possible: allergic reactions like skin rash, itching or hives, swelling of the face, lips, or tongue bloody or black, tarry breathing problems changes in vision chest pain chills confusion constipation cough diarrhea dizziness or feeling faint or lightheaded fast or irregular heartbeat fever flushing joint pain low blood counts - this medicine may decrease the number of white blood cells, red blood cells and platelets. You may be at increased risk for infections and bleeding. muscle pain muscle weakness pain, tingling, numbness in the hands or feet persistent headache redness, blistering, peeling or loosening of the skin, including inside the mouth signs and symptoms of high blood sugar such as dizziness; dry mouth; dry skin; fruity breath; nausea; stomach pain; increased hunger or thirst; increased urination signs and symptoms of kidney injury like trouble passing urine or change in the amount of urine signs and symptoms of liver injury like dark urine, light-colored stools, loss of appetite, nausea, right upper belly pain, yellowing of the eyes or skin sweating swollen lymph nodes weight loss Side effects that usually do not require  medical attention (report to your doctor or health care professional if they continue or are bothersome): decreased appetite hair loss tiredness This list may not describe all possible side effects. Call your doctor for medical advice about side effects. You may report side effects to FDA at 1-800-FDA-1088. Where should I keep my medication? This drug is given in a hospital or clinic and will not be stored at home. NOTE: This sheet is a summary. It may not cover all possible  information. If you have questions about this medicine, talk to your doctor, pharmacist, or health care provider.  2022 Elsevier/Gold Standard (2021-01-10 00:00:00)

## 2021-05-20 ENCOUNTER — Telehealth: Payer: Self-pay | Admitting: Internal Medicine

## 2021-05-20 NOTE — Telephone Encounter (Signed)
Calls weekend doc 10:43 AM 05/20/2021  - increased cough x 1-2 days  - yellow sputum - incrase  neg use  x several days - she just usual flare up is white sputum but because sputum is yellow she feels is not a flare up - after coughing wheezing stops - no fever - denies covid or flu or sick contact -no baesline pft  - on 2L o2 at  home - pulse ox 99% current at rest  -   A AECOPD  Plan  - offfered steroids - but she declined due to side effects - describes mania  - Take doxycycline 100mg  po twice daily x 5 days; take after meals and avoid sunlight - go to ER if worse   [facility] [address] - called in 10:51 AM  05/20/2021     SIGNATURE    Dr. Brand Males, M.D., F.C.C.P,  Pulmonary and Critical Care Medicine Staff Physician, [facility] Director - Interstitial Lung Disease  Program  [facility] [place], [place], [address]  NPI Number:  NPI #[id number]  Pager: [phone], If no answer  -> Check AMION or Try [place] Telephone (clinical office): [phone] Telephone (research): [phone]  10:49 AM 05/20/2021   No flowsheet data found.     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   reports that she quit smoking about 14 years ago. Her smoking use included cigarettes. She has a 30.00 pack-year smoking history. She has never used smokeless tobacco.  Past Surgical History:  Procedure Laterality Date   LEFT HEART CATHETERIZATION WITH CORONARY ANGIOGRAM N/A 06/08/2013   Procedure: LEFT HEART CATHETERIZATION  WITH CORONARY ANGIOGRAM;  Surgeon: Sanda Klein, MD;  Location: [facility];  Service: Cardiovascular;  Laterality: N/A;    Allergies  Allergen Reactions   Afrin [Oxymetazoline] Anxiety   Codeine Nausea And Vomiting   Imdur [Isosorbide Dinitrate]     headache   Prednisone Other (See Comments)    Reaction:Abnormal behavior; cannot take in pill form but CAN tolerate the injection   Pulmicort [Budesonide]     "feeling of torture"   Sulfa Antibiotics     Hallucinations, decreased appetite    Immunization History  Administered Date(s) Administered   Influenza, High Dose Seasonal PF 02/08/2016   Influenza,inj,Quad PF,6+ Mos 02/08/2016   PFIZER Comirnaty(Gray Top)Covid-19 Tri-[place] Vaccine 07/07/2019, 08/05/2019, 01/15/2020   PFIZER(Purple Top)SARS-COV-2 Vaccination 07/07/2019, 08/05/2019, 01/15/2020   Pneumococcal Conjugate-13 01/07/2014, 02/05/2015   Pneumococcal Polysaccharide-23 02/04/2013    Family History  Problem Relation Age of Onset   Lung cancer Father    Heart attack Neg Hx    Stroke Neg Hx    Hypertension Neg Hx      Current Outpatient Medications:    acetaminophen (TYLENOL) 325 MG tablet, Per bottle as needed, Disp: , Rfl:    albuterol (VENTOLIN HFA) 108 (90 Base) MCG/ACT inhaler, Inhale into the lungs., Disp: , Rfl:    ALPRAZolam (XANAX) 0.25 MG tablet, 1/2-1 twice daily as needed, Disp: , Rfl: 1   amLODipine (NORVASC) 2.5 MG tablet, Take by mouth based on top number of BP. 1 tab (2.5mg ) if BP 140-159, 2 tabs (5mg ) for  160-179, 3 tabs (7.5 mg) for BP >180, Disp: 180 tablet, Rfl: 3   atorvastatin (LIPITOR) 20 MG tablet, Take 2 tablets (40 mg total) by mouth daily. NEEDS 20 MG TABLETS SECONDARY TO SWALLOWING ISSUES, Disp: 180 tablet, Rfl: 3   budesonide-formoterol (SYMBICORT) 160-4.5 MCG/ACT inhaler, Inhale 2 puffs into the lungs 2 (two) times daily., Disp: , Rfl:    guaiFENesin (MUCINEX) 600 MG 12 hr tablet, Take 600 mg by mouth 2 (two) times daily as needed for  cough or to loosen phlegm., Disp: , Rfl:    ipratropium-albuterol (DUONEB) 0.5-2.5 (3) MG/3ML SOLN, Take 3 mLs by nebulization every 4 (four) hours as needed (wheezing/shortness of breath). **PLAN C**, Disp: , Rfl:    methylPREDNISolone (MEDROL) 4 MG tablet, take  4 each am x 2 days,   2 each am x 2 days,  1 each am x 2 days and stop (Patient not taking: Reported on 05/11/2021), Disp: 14 tablet, Rfl: 11   nitroGLYCERIN (NITROSTAT) 0.4 MG SL tablet, ONE TABLET UNDER TONGUE AS NEEDED FOR CHEST PAIN EVERY 5 MINUTES FOR 3 DOSES, Disp: 25 tablet, Rfl: 3   NON FORMULARY, [facility]  Onychomycosis Nail Lacquer -  Fluconazole 2%, Terbinafine 1% DMSO/undecylenic acid 25% Apply to affected nail once daily Qty. 120 gm 3 refills, Disp: , Rfl:    OXYGEN, 2lpm 24/7, Disp: , Rfl:    Probiotic Product (PROBIOTIC ADVANCED PO), Take 1 tablet by mouth daily., Disp: , Rfl:    Simethicone 180 MG CAPS, Use as needed, Disp: , Rfl:   Current Facility-Administered Medications:    methylPREDNISolone acetate (DEPO-MEDROL) injection 120 mg, 120 mg, Intramuscular, Once, Tanda Rockers, MD   methylPREDNISolone acetate (DEPO-MEDROL) injection 120 mg, 120 mg, Intramuscular, Once, Tanda Rockers, MD

## 2021-05-24 ENCOUNTER — Ambulatory Visit
Admission: RE | Admit: 2021-05-24 | Discharge: 2021-05-24 | Disposition: A | Discharge status: Home / Self Care | Payer: Medicare Other | Source: Ambulatory Visit | Attending: Radiation Oncology | Admitting: Radiation Oncology

## 2021-05-24 ENCOUNTER — Telehealth: Payer: Self-pay | Admitting: *Deleted

## 2021-05-24 ENCOUNTER — Encounter: Payer: Self-pay | Admitting: Radiation Oncology

## 2021-05-24 ENCOUNTER — Other Ambulatory Visit: Payer: Self-pay

## 2021-05-24 VITALS — BP 141/77 | HR 84 | Temp 97.8°F | Resp 20 | Ht 62.5 in | Wt 91.8 lb

## 2021-05-24 DIAGNOSIS — Z85118 Personal history of other malignant neoplasm of bronchus and lung: Secondary | ICD-10-CM | POA: Diagnosis not present

## 2021-05-24 DIAGNOSIS — I890 Lymphedema, not elsewhere classified: Secondary | ICD-10-CM | POA: Diagnosis not present

## 2021-05-24 DIAGNOSIS — R519 Headache, unspecified: Secondary | ICD-10-CM | POA: Diagnosis not present

## 2021-05-24 DIAGNOSIS — Z923 Personal history of irradiation: Secondary | ICD-10-CM | POA: Insufficient documentation

## 2021-05-24 DIAGNOSIS — C320 Malignant neoplasm of glottis: Secondary | ICD-10-CM

## 2021-05-24 DIAGNOSIS — C3412 Malignant neoplasm of upper lobe, left bronchus or lung: Secondary | ICD-10-CM

## 2021-05-24 DIAGNOSIS — R1310 Dysphagia, unspecified: Secondary | ICD-10-CM | POA: Diagnosis not present

## 2021-05-24 DIAGNOSIS — K219 Gastro-esophageal reflux disease without esophagitis: Secondary | ICD-10-CM | POA: Insufficient documentation

## 2021-05-24 DIAGNOSIS — Z8521 Personal history of malignant neoplasm of larynx: Secondary | ICD-10-CM | POA: Diagnosis present

## 2021-05-24 MED ORDER — OXYMETAZOLINE HCL 0.05 % NA SOLN
1.0000 | Freq: Once | NASAL | Status: DC
  Filled 2021-05-24: qty 30

## 2021-05-24 NOTE — Telephone Encounter (Signed)
CALLED PATIENT TO INFORM THAT FU APPT. WITH DR. MARCELLINO HAS BEEN MOVED TO 08-08-21- ARRIVAL TIME- 2:15 PM, LVM FOR A RETURN CALL

## 2021-05-24 NOTE — Progress Notes (Signed)
Ms. Towson presents for follow-up after completing radiation to the larynx on 08/25/2019 (also completed radiation to her left lung on 11/21/2016)  Pain issues, if any: None Using a feeding tube?: N/A Weight changes, if any: Holding steady  Swallowing issues, if any: Mild dysphagia Smoking or chewing tobacco? No Using fluoride trays daily? N/A--reports she had her routine dental visit last week (has teeth cleaned 4x year).  Last ENT visit was on: 02/01/2021 Saw Dr. Lind Guest:  "--Assessment: My impression is that Kary has  1. Hoarseness  2. Clogged ear, left  3. 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 History of laryngeal cancer  --No evidence of disease status post completion of radiation therapy for lesion of the right vocal cord. The left vocal fold did demonstrate continued hypomobility. I will plan on seeing her back every 6 months, as I will alternate with Dr. Isidore Moos (Radiation Oncology). --Plan:  1. RTC 6 months for recheck. 2. Trial of OTC flonase + daily oral antihistamine 3. Trial pepcid OTC for GERD 4. Monitor left neck and throat symptoms, if worse patient will call me and we will coordinate CT neck w/ contrast"  Cough/SOB/O2 use:  No cough or shortness of breath. Using 1 liter of oxygen daily and 2 liters at bedtime. Other notable issues, if any: Occasional, mild headache. LT ear infection improved.

## 2021-05-25 ENCOUNTER — Telehealth: Payer: Self-pay | Admitting: *Deleted

## 2021-05-25 NOTE — Telephone Encounter (Signed)
Called patient to inform of appt. with Dr. Blenda Nicely on April 5 - arrival time- 9:40 am, spoke with patient and she is aware of this appt.

## 2021-05-26 ENCOUNTER — Ambulatory Visit (INDEPENDENT_AMBULATORY_CARE_PROVIDER_SITE_OTHER): Payer: Medicare Other | Admitting: Psychologist

## 2021-05-26 ENCOUNTER — Encounter: Payer: Self-pay | Admitting: Radiation Oncology

## 2021-05-26 DIAGNOSIS — F411 Generalized anxiety disorder: Secondary | ICD-10-CM | POA: Diagnosis not present

## 2021-05-26 NOTE — Progress Notes (Signed)
Radiation Oncology         [phone] ________________________________  Name: Brooke [name] MRN: [id number]  Date: 05/24/2021  DOB: 1945/09/11  Follow-Up Visit Note  CC: [name], MD  Helayne Seminole, MD  Diagnosis and Prior Radiotherapy:       ICD-10-CM   1. 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 Mayo presents for follow-up after completing radiation to the larynx on 08/25/2019 (also completed radiation to her left lung on 11/21/2016)  Pain issues, if any: None Using a feeding tube?: N/A Weight changes, if any: Holding steady  Swallowing issues, if any: Mild dysphagia Smoking or chewing tobacco? No Using fluoride trays daily? N/A--reports she had her routine dental visit last week (has teeth cleaned 4x year).  Last ENT visit was on: 02/01/2021 Saw Dr. Lind Guest:  "--Assessment: My impression is that Carmalita has  1. Hoarseness  2. Clogged ear, left  3. Primary cancer of left lung metastatic to other site ([place])  4. History of laryngeal cancer  --No evidence of disease status post completion of radiation therapy for lesion of the right vocal cord. The left vocal fold did demonstrate continued hypomobility. I will plan on seeing her back every 6 months, as I will alternate with Dr. Isidore Moos (Radiation Oncology). --Plan:  1. RTC 6 months for recheck. 2. Trial of OTC flonase + daily oral antihistamine 3. Trial pepcid OTC for GERD 4. Monitor left neck and  throat symptoms, if worse patient will call me and we will coordinate CT neck w/ contrast"  Cough/SOB/O2 use:  No cough or shortness of breath. Using 1 liter of oxygen daily and 2 liters at bedtime. Other notable issues, if any: Occasional, mild headache. LT ear infection improved.    Vitals:   05/24/21 1125  BP: (!) 141/77  Pulse: 84  Resp: 20  Temp: 97.8 F (36.6 C)  SpO2: 100%             ALLERGIES:  is allergic to afrin [oxymetazoline], codeine, imdur [isosorbide dinitrate], prednisone, pulmicort [budesonide], and sulfa antibiotics.  Meds: Current Outpatient Medications  Medication Sig Dispense Refill   acetaminophen (TYLENOL) 325 MG tablet Per bottle as needed     albuterol (VENTOLIN HFA) 108 (90 Base) MCG/ACT inhaler Inhale into the lungs.     ALPRAZolam (XANAX) 0.25 MG tablet 1/2-1 twice daily as needed  1   amLODipine (NORVASC) 2.5 MG tablet Take by mouth based on top number of BP. 1 tab (2.5mg ) if BP 140-159, 2 tabs (5mg ) for 160-179, 3 tabs (7.5 mg) for BP >180 180 tablet 3   atorvastatin (LIPITOR) 20 MG tablet Take 2 tablets (40 mg total) by mouth daily. NEEDS 20 MG TABLETS SECONDARY TO SWALLOWING ISSUES 180 tablet 3   budesonide-formoterol (SYMBICORT) 160-4.5 MCG/ACT inhaler Inhale 2 puffs into the lungs 2 (two) times daily.     guaiFENesin (MUCINEX) 600 MG 12 hr tablet Take 600 mg by mouth 2 (two)  times daily as needed for cough or to loosen phlegm.     ipratropium-albuterol (DUONEB) 0.5-2.5 (3) MG/3ML SOLN Take 3 mLs by nebulization every 4 (four) hours as needed (wheezing/shortness of breath). **PLAN C**     methylPREDNISolone (MEDROL) 4 MG tablet take  4 each am x 2 days,   2 each am x 2 days,  1 each am x 2 days and stop (Patient not taking: Reported on 05/11/2021) 14 tablet 11   nitroGLYCERIN (NITROSTAT) 0.4 MG SL tablet ONE TABLET UNDER TONGUE AS NEEDED FOR CHEST PAIN EVERY 5 MINUTES FOR 3 DOSES 25 tablet 3   NON FORMULARY [facility]  Onychomycosis Nail  Lacquer -  Fluconazole 2%, Terbinafine 1% DMSO/undecylenic acid 25% Apply to affected nail once daily Qty. 120 gm 3 refills     OXYGEN 2lpm 24/7     Probiotic Product (PROBIOTIC ADVANCED PO) Take 1 tablet by mouth daily.     Simethicone 180 MG CAPS Use as needed     Current Facility-Administered Medications  Medication Dose Route Frequency Provider Last Rate Last Admin   methylPREDNISolone acetate (DEPO-MEDROL) injection 120 mg  120 mg Intramuscular Once Tanda Rockers, MD       methylPREDNISolone acetate (DEPO-MEDROL) injection 120 mg  120 mg Intramuscular Once Tanda Rockers, MD        Physical Findings: The patient is in no acute distress. Patient is alert and oriented. Wt Readings from Last 3 Encounters:  05/24/21 91 lb 12.8 oz (41.6 kg)  05/19/21 93 lb (42.2 kg)  05/11/21 92 lb 9.6 oz (42 kg)    height is 5' 2.5" (1.588 m) and weight is 91 lb 12.8 oz (41.6 kg). Her temporal temperature is 97.8 F (36.6 C). Her blood pressure is 141/77 (abnormal) and her pulse is 84. Her respiration is 20 and oxygen saturation is 100%. .  General: Alert and oriented, in no acute distress; hoarseness is stable HEENT: Head is normocephalic. Extraocular movements are intact.  Oropharynx / mouth are notable for no lesions Neck is notable for stable lymphedema in anterior neck, no masses; skin intact Skin: Skin in treatment fields shows satisfactory healing  Psychiatric: Judgment and insight are intact. Affect is appropriate. Heart RRR Chest CTAB  PROCEDURE NOTE:  PROCEDURE NOTE: After obtaining consent and deferring topical oxymetazoline due to possible allergy, the flexible endoscope was coated with lidocaine gel and was introduced and passed through the nasal cavity.  The nasopharynx, oropharynx, hypopharynx, and larynx  were then examined. No lesions appreciated in the mucosal axis.  The true cords were symmetrically mobile.  She tolerated this well.    Lab Findings: Lab Results  Component  Value Date   WBC 4.2 05/19/2021   HGB 12.9 05/19/2021   HCT 40.2 05/19/2021   MCV 89.9 05/19/2021   PLT 200 05/19/2021    Lab Results  Component Value Date   TSH 1.232 04/28/2021    Radiographic Findings: CT Chest W Contrast  Addendum Date: 05/19/2021   ADDENDUM REPORT: 05/19/2021 12:41 ADDENDUM: Low-density lesion in the spleen estimated to measure approximately 1.9 x 1.7 cm, stable compared to recent prior examination, previously characterized as a treated metastatic lesion. No upper abdominal lymphadenopathy is noted in the visualized portions of the abdomen. Electronically Signed   By: Vinnie Langton M.D.   On: 05/19/2021 12:41   Result Date: 05/19/2021 CLINICAL DATA:  76 year old female with history of lung cancer diagnosed 4 years ago. Restaging examination. EXAM: CT CHEST WITH CONTRAST TECHNIQUE: Multidetector  CT imaging of the chest was performed during intravenous contrast administration. RADIATION DOSE REDUCTION: This exam was performed according to the departmental dose-optimization program which includes automated exposure control, adjustment of the mA and/or kV according to patient size and/or use of iterative reconstruction technique. CONTRAST:  20mL OMNIPAQUE IOHEXOL 300 MG/ML  SOLN COMPARISON:  Chest CT 01/12/2021. FINDINGS: Cardiovascular: Heart size is normal. Small amount of pericardial fluid and/or thickening. No definite pericardial calcifications. There is aortic atherosclerosis, as well as atherosclerosis of the great vessels of the mediastinum and the coronary arteries, including calcified atherosclerotic plaque in the left main, left anterior descending, left circumflex and right coronary arteries. Mediastinum/Nodes: No pathologically enlarged mediastinal or hilar lymph nodes. Esophagus is unremarkable in appearance. Lungs/Pleura: Chronic volume loss and architectural distortion in the apex of the left upper lobe, and to a lesser extent in the superior segment of the left  lower lobe, compatible with chronic postradiation mass-like fibrosis. No definite new suspicious appearing pulmonary nodules or masses are noted. No acute consolidative airspace disease. No pleural effusions. Diffuse bronchial wall thickening with moderate centrilobular and paraseptal emphysema. Upper Abdomen: Aortic atherosclerosis. Low-attenuation lesions scattered throughout the liver, compatible with cysts, largest of which is in segment 3 of the liver measuring 3.2 x 2.0 cm. Musculoskeletal: Chronic appearing compression fracture of T6 with 40% loss of anterior vertebral body height is unchanged. New compression fracture of superior endplate of T8 with 03% loss of anterior vertebral body height, but with no paravertebral soft tissue swelling, presumably late subacute or early chronic. There are no aggressive appearing lytic or blastic lesions noted in the visualized portions of the skeleton. IMPRESSION: 1. 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 No findings to suggest local recurrence of disease or definite metastatic disease in the thorax. 2. Diffuse bronchial wall thickening with moderate centrilobular and paraseptal emphysema; imaging findings suggestive of underlying COPD. 3. Aortic atherosclerosis, in addition to left main and three-vessel coronary artery disease. Assessment for potential risk factor modification, dietary therapy or pharmacologic therapy may be warranted, if clinically indicated. 4. Compression fractures of T6 and T8, new (but nonacute) at T8, as discussed above. 5. Additional incidental findings, as above. Aortic Atherosclerosis (ICD10-I70.0) and Emphysema (ICD10-J43.9). Electronically Signed: By: Vinnie Langton M.D. On: 05/17/2021 11:18     Impression/Plan:    1) Head and Neck Cancer Status: NED  2) Nutritional Status: stable  Wt Readings from Last 3 Encounters:  05/24/21 91 lb 12.8 oz (41.6 kg)  05/19/21 93 lb  (42.2 kg)  05/11/21 92 lb 9.6 oz (42 kg)   PEG tube: None  3) Risk Factors: The patient has been educated about risk factors including alcohol and tobacco abuse; they understand that avoidance of alcohol and tobacco is important to prevent recurrences as well as other cancers  4) Swallowing: Continue swallowing therapy  5)  Thyroid function: Check annually - WNL Lab Results  Component Value Date   TSH 1.232 04/28/2021    6)  Follow-up w/ ENT in 3 mo and see me back in 9 mo (we can alternate seeing her every 22mo once she passes the 2 yr benchmark post-treatment)  7) 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  On date of service, in total, I spent 30 minutes on this encounter.  Patient seen in person. _____________________________________   Eppie Gibson, MD

## 2021-05-26 NOTE — Progress Notes (Signed)
                Dhruti Ghuman, PsyD 

## 2021-05-26 NOTE — Progress Notes (Signed)
[facility] Counselor/Therapist Progress Note  Patient ID: Brooke [name], MRN: [id number],    Date: 05/26/2021  Time Spent: 11:06 am to 11:46 am; total time: 40 minutes   This session was held via phone teletherapy due to the coronavirus risk at this time. The patient consented to phone teletherapy and was located at her home during this session. She is aware it is the responsibility of the patient to secure confidentiality on her end of the session. The provider was in a private home office for the duration of this session. Limits of confidentiality were discussed with the patient.   Treatment Type: Individual Therapy  Reported Symptoms: Anxiety   Mental Status Exam: Appearance:  NA     Behavior: Appropriate  Motor: NA  Speech/Language:  Clear and Coherent  Affect: Appropriate  Mood: normal  Thought process: normal  Thought content:   WNL  Sensory/Perceptual disturbances:   WNL  Orientation: oriented to person, place, and time/date  Attention: Good  Concentration: Good  Memory: WNL  Fund of knowledge:  Fair  Insight:   Fair  Judgment:  Good  Impulse Control: Good   Risk Assessment: Danger to Self:  No Self-injurious Behavior: No Danger to Others: No Duty to Warn:no Physical Aggression / Violence:No  Access to Firearms a concern: No  Gang Involvement:No   Subjective: The patient began the session talking about how she had a good holiday season. From there, she voiced that she wanted some coping strategies. After discussing mindfulness she described herself as better. From there, she briefly asked about massages for anxiety and curious if anyone could come to her house to give a massage. Patient processed thoughts and emotions. She asked to follow up. She denied suicidal and homicidal ideation.    Interventions:  Worked on developing a therapeutic relationship with the patient using active listening and reflective statements. Provided emotional support  using empathy and validation. Reviewed the treatment plan with the patient. Reviewed events since the intake. Normalized and validated expressed thoughts. Praised patient for doing okay. Provided psychoeducation about mindfulness. Practiced and processed mindfulness. Praised patient for experiencing less distress. Normalized patient's experience. Encouraged patient to focus less on the sensation of breathing and more what she is hearing, seeing and feeling. Assisted in problem solving. Processed expressed thoughts and emotions. Provided empathic statements. Assessed for suicidal and homicidal ideation.   Homework: Practice mindfulness  Next Session: Review homework and emotional support.   Diagnosis: F41.1 generalized anxiety disorder  Plan:   Client Abilities: Friendly and easy to develop rapport  Client Preferences: ACT  Client statement of Needs: Coping strategies and emotional support  Treatment Level: Outpatient  Goals Work through the grieving process and face reality of own death Accept emotional support from others around them Live life to the fullest, event though time may be limited Become as knowledgeable about the medical condition  Reduce fear, anxiety about the health condition  Accept the illness Accept the role of psychological and behavioral factors  Stabilize anxiety level wile increasing ability to function Learn and implement coping skills that result in a reduction of anxiety  Alleviate depressive symptoms Recognize, accept, and cope with depressive feelings Develop healthy thinking patterns Develop healthy interpersonal relationships  Objectives target date for all objectives is 04/21/2022 Identify feelings associated with the illness Family members share with each other feelings Identify the losses or limitations that have been experienced Verbalize acceptance of the reality of the medical condition Commit to learning and implement a proactive  approach to  managing personal stresses Verbalize an understanding of the medical condition Work with therapist to develop a plan for coping with stress Learn and implement skills for managing stress Engage in social, productive activities that are possible Engage in faith based activities implement positive imagery Identify coping skills and sources of emotional support Patient's partner and family members verbalize their fears regarding severity of health condition Identify sources of emotional distress  Learning and implement calming skills to reduce overall anxiety Learn and implement problem solving strategies Identify and engage in pleasant activities Learning and implement personal and interpersonal skills to reduce anxiety and improve interpersonal relationships Learn to accept limitations in life and commit to tolerating, rather than avoiding, unpleasant emotions while accomplishing meaningful goals Identify major life conflicts from the past and present that form the basis for present anxiety Learn and implement behavioral strategies Verbalize an understanding and resolution of current interpersonal problems Learn and implement problem solving and decision making skills Learn and implement conflict resolution skills to resolve interpersonal problems Verbalize an understanding of healthy and unhealthy emotions verbalize insight into how past relationships may be influence current experiences with depression Use mindfulness and acceptance strategies and increase value based behavior  Increase hopeful statements about the future.   Interventions Teach about stress and ways to handle stress Assist the patient in developing a coping action plan for stressors Conduct skills based training for coping strategies Train problem focused skills Sort out what activities the individual can do Encourage patient to rely upon his/her spiritual faith Teach the patient to use guided imagery Probe and  evaluate family's ability to provide emotional support Allow family to share their fears Assist the patient in identifying, sorting through, and verbalizing the various feelings generated by his/her medical condition Meet with family members  Ask patient list out limitations  Use stress inoculation training  Use Acceptance and Commitment Therapy to help client accept uncomfortable realities in order to accomplish value-consistent goals Reinforce the client's insight into the role of his/her past emotional pain and present anxiety  Discuss examples demonstrating that unrealistic worry overestimates the probability of threats and underestimate patient's ability  Assist the patient in analyzing his or her worries Help patient understand that avoidance is reinforcing  Behavioral activation help the client explore the relationship, nature of the dispute,  Help the client develop new interpersonal skills and relationships Conduct Problem so living therapy Teach conflict resolution skills Use a process-experiential approach Conduct TLDP Conduct ACT  The patient and clinician reviewed the treatment plan on 05/26/2021. The patient approved of the treatment plan.   Conception Chancy, PsyD

## 2021-05-29 ENCOUNTER — Telehealth: Payer: Self-pay | Admitting: Internal Medicine

## 2021-05-29 NOTE — Telephone Encounter (Signed)
Fine to try the doxy and if not improving then check cxr   Also medrol 4 mg     4 each am x 2 days,   2 each am x 2 days,  1 each am x 2 days and stop

## 2021-05-29 NOTE — Telephone Encounter (Signed)
I called and spoke with the patient and she reports that she is having a lot of yellow sputum x 5 days. She has been having to use her nebulizer 3 times a  day. She reports that it does not help much and denies and fever, body aches, and chills. She has not take anything else.   She has taken Methylprednisolone in the past and has a few left in a bottle at home. She wants to know if she would need chest xray.   She had a home health nurse come out and was told that they could give her some antibiotic of Doxycycline. She was last seen on 05/11/21. Please advise.

## 2021-05-29 NOTE — Telephone Encounter (Signed)
Called and spoke with patient. She verbalized understanding. I asked her if she had enough of the Medrol at home and she stated that she had a brand new prescription that she hasn't opened yet. She is aware to call us back after a few days of the Medrol isn't helping for a CXR.   Nothing further needed at time of call.

## 2021-06-04 ENCOUNTER — Other Ambulatory Visit: Payer: Self-pay | Admitting: Oncology

## 2021-06-07 ENCOUNTER — Ambulatory Visit (INDEPENDENT_AMBULATORY_CARE_PROVIDER_SITE_OTHER): Payer: Medicare Other | Admitting: Psychologist

## 2021-06-07 DIAGNOSIS — F411 Generalized anxiety disorder: Secondary | ICD-10-CM | POA: Diagnosis not present

## 2021-06-07 NOTE — Progress Notes (Signed)
[facility] Counselor/Therapist Progress Note  Patient ID: Selah Klang, MRN: [id number],    Date: 06/07/2021  Time Spent: 1:02 pm to 1:41 pm; total time: 39 minutes   This session was held via phone teletherapy due to the coronavirus risk at this time. The patient consented to phone teletherapy and was located at her home during this session. She is aware it is the responsibility of the patient to secure confidentiality on her end of the session. The provider was in a private home office for the duration of this session. Limits of confidentiality were discussed with the patient.   Treatment Type: Individual Therapy  Reported Symptoms: Less anxiety   Mental Status Exam: Appearance:  NA     Behavior: Appropriate  Motor: NA  Speech/Language:  Clear and Coherent  Affect: Appropriate  Mood: normal  Thought process: normal  Thought content:   WNL  Sensory/Perceptual disturbances:   WNL  Orientation: oriented to person, place, and time/date  Attention: Good  Concentration: Good  Memory: WNL  Fund of knowledge:  Fair  Insight:   Fair  Judgment:  Good  Impulse Control: Good   Risk Assessment: Danger to Self:  No Self-injurious Behavior: No Danger to Others: No Duty to Warn:no Physical Aggression / Violence:No  Access to Firearms a concern: No  Gang Involvement:No   Subjective: The patient disclosed that she injured her back over the weekend and is waiting to see the in home nurse tomorrow. From there, she reflected on other events that have occurred in her life. She expressed less anxiety. From there she talked about how social support has been really beneficial for her. She process different thoughts and emotions she was experiencing. She asked to follow up. She denied suicidal and homicidal ideation.    Interventions:  Worked on developing a therapeutic relationship with the patient using active listening and reflective statements. Provided emotional support  using empathy and validation. Used summary statements. Praised patient for doing slightly better compared to the previous session. Explored what has assist the patient. Normalized patient's experience with mindfulness. Identified the theme of socialization and how it has helped the patient. Briefly explored the concept of guided imagery. Discussed next steps for counseling. Provided empathic statements. Assessed for suicidal and homicidal ideation.   Homework: NA  Next Session: Emotional support; guided imagery, and inform about in home massage therapists  Diagnosis: F41.1 generalized anxiety disorder  Plan:   Client Abilities: Friendly and easy to develop rapport  Client Preferences: ACT  Client statement of Needs: Coping strategies and emotional support  Treatment Level: Outpatient  Goals Work through the grieving process and face reality of own death Accept emotional support from others around them Live life to the fullest, event though time may be limited Become as knowledgeable about the medical condition  Reduce fear, anxiety about the health condition  Accept the illness Accept the role of psychological and behavioral factors  Stabilize anxiety level wile increasing ability to function Learn and implement coping skills that result in a reduction of anxiety  Alleviate depressive symptoms Recognize, accept, and cope with depressive feelings Develop healthy thinking patterns Develop healthy interpersonal relationships  Objectives target date for all objectives is 04/21/2022 Identify feelings associated with the illness Family members share with each other feelings Identify the losses or limitations that have been experienced Verbalize acceptance of the reality of the medical condition Commit to learning and implement a proactive approach to managing personal stresses Verbalize an understanding of the medical  condition Work with therapist to develop a plan for coping with  stress Learn and implement skills for managing stress Engage in social, productive activities that are possible Engage in faith based activities implement positive imagery Identify coping skills and sources of emotional support Patient's partner and family members verbalize their fears regarding severity of health condition Identify sources of emotional distress  Learning and implement calming skills to reduce overall anxiety Learn and implement problem solving strategies Identify and engage in pleasant activities Learning and implement personal and interpersonal skills to reduce anxiety and improve interpersonal relationships Learn to accept limitations in life and commit to tolerating, rather than avoiding, unpleasant emotions while accomplishing meaningful goals Identify major life conflicts from the past and present that form the basis for present anxiety Learn and implement behavioral strategies Verbalize an understanding and resolution of current interpersonal problems Learn and implement problem solving and decision making skills Learn and implement conflict resolution skills to resolve interpersonal problems Verbalize an understanding of healthy and unhealthy emotions verbalize insight into how past relationships may be influence current experiences with depression Use mindfulness and acceptance strategies and increase value based behavior  Increase hopeful statements about the future.   Interventions Teach about stress and ways to handle stress Assist the patient in developing a coping action plan for stressors Conduct skills based training for coping strategies Train problem focused skills Sort out what activities the individual can do Encourage patient to rely upon his/her spiritual faith Teach the patient to use guided imagery Probe and evaluate family's ability to provide emotional support Allow family to share their fears Assist the patient in identifying, sorting  through, and verbalizing the various feelings generated by his/her medical condition Meet with family members  Ask patient list out limitations  Use stress inoculation training  Use Acceptance and Commitment Therapy to help client accept uncomfortable realities in order to accomplish value-consistent goals Reinforce the client's insight into the role of his/her past emotional pain and present anxiety  Discuss examples demonstrating that unrealistic worry overestimates the probability of threats and underestimate patient's ability  Assist the patient in analyzing his or her worries Help patient understand that avoidance is reinforcing  Behavioral activation help the client explore the relationship, nature of the dispute,  Help the client develop new interpersonal skills and relationships Conduct Problem so living therapy Teach conflict resolution skills Use a process-experiential approach Conduct TLDP Conduct ACT  The patient and clinician reviewed the treatment plan on 05/26/2021. The patient approved of the treatment plan.   Conception Chancy, PsyD                  Conception Chancy, PsyD

## 2021-06-09 ENCOUNTER — Inpatient Hospital Stay: Payer: Medicare Other

## 2021-06-09 ENCOUNTER — Encounter: Payer: Self-pay | Admitting: Nurse Practitioner

## 2021-06-09 ENCOUNTER — Telehealth: Payer: Self-pay | Admitting: *Deleted

## 2021-06-09 ENCOUNTER — Ambulatory Visit: Payer: Self-pay | Admitting: Nurse Practitioner

## 2021-06-09 ENCOUNTER — Ambulatory Visit: Payer: Self-pay

## 2021-06-09 ENCOUNTER — Other Ambulatory Visit: Payer: Self-pay

## 2021-06-09 ENCOUNTER — Encounter: Payer: Self-pay | Admitting: *Deleted

## 2021-06-09 ENCOUNTER — Ambulatory Visit (HOSPITAL_BASED_OUTPATIENT_CLINIC_OR_DEPARTMENT_OTHER)
Admission: RE | Admit: 2021-06-09 | Discharge: 2021-06-09 | Disposition: A | Discharge status: Home / Self Care | Payer: Medicare Other | Source: Ambulatory Visit | Attending: Nurse Practitioner | Admitting: Nurse Practitioner

## 2021-06-09 ENCOUNTER — Inpatient Hospital Stay: Payer: Medicare Other | Admitting: Nurse Practitioner

## 2021-06-09 ENCOUNTER — Inpatient Hospital Stay: Payer: Medicare Other | Attending: Oncology

## 2021-06-09 VITALS — BP 145/75 | HR 89 | Temp 97.8°F | Resp 18 | Ht 62.5 in | Wt 91.2 lb

## 2021-06-09 VITALS — BP 162/87 | HR 81 | Temp 97.6°F | Resp 18

## 2021-06-09 DIAGNOSIS — C3412 Malignant neoplasm of upper lobe, left bronchus or lung: Secondary | ICD-10-CM | POA: Insufficient documentation

## 2021-06-09 DIAGNOSIS — R5381 Other malaise: Secondary | ICD-10-CM | POA: Insufficient documentation

## 2021-06-09 DIAGNOSIS — Z79899 Other long term (current) drug therapy: Secondary | ICD-10-CM | POA: Diagnosis not present

## 2021-06-09 DIAGNOSIS — Z5112 Encounter for antineoplastic immunotherapy: Secondary | ICD-10-CM | POA: Diagnosis present

## 2021-06-09 DIAGNOSIS — Z9981 Dependence on supplemental oxygen: Secondary | ICD-10-CM | POA: Insufficient documentation

## 2021-06-09 DIAGNOSIS — C7889 Secondary malignant neoplasm of other digestive organs: Secondary | ICD-10-CM | POA: Insufficient documentation

## 2021-06-09 DIAGNOSIS — J440 Chronic obstructive pulmonary disease with acute lower respiratory infection: Secondary | ICD-10-CM | POA: Diagnosis not present

## 2021-06-09 DIAGNOSIS — Z86711 Personal history of pulmonary embolism: Secondary | ICD-10-CM | POA: Insufficient documentation

## 2021-06-09 DIAGNOSIS — M4854XA Collapsed vertebra, not elsewhere classified, thoracic region, initial encounter for fracture: Secondary | ICD-10-CM | POA: Insufficient documentation

## 2021-06-09 DIAGNOSIS — I252 Old myocardial infarction: Secondary | ICD-10-CM | POA: Insufficient documentation

## 2021-06-09 LAB — CMP (CANCER CENTER ONLY)
ALT: 18 U/L (ref 0–44)
AST: 18 U/L (ref 15–41)
Albumin: 4.2 g/dL (ref 3.5–5.0)
Alkaline Phosphatase: 61 U/L (ref 38–126)
Anion gap: 8 (ref 5–15)
BUN: 12 mg/dL (ref 8–23)
CO2: 28 mmol/L (ref 22–32)
Calcium: 9.3 mg/dL (ref 8.9–10.3)
Chloride: 98 mmol/L (ref 98–111)
Creatinine: 0.42 mg/dL — ABNORMAL LOW (ref 0.44–1.00)
GFR, Estimated: >60 mL/min (ref >60)
Glucose, Bld: 104 mg/dL — ABNORMAL HIGH (ref 70–99)
Potassium: 3.7 mmol/L (ref 3.5–5.1)
Sodium: 134 mmol/L — ABNORMAL LOW (ref 135–145)
Total Bilirubin: 0.5 mg/dL (ref 0.3–1.2)
Total Protein: 7.2 g/dL (ref 6.5–8.1)

## 2021-06-09 LAB — TSH: TSH: 0.83 u[IU]/mL (ref 0.350–4.500)

## 2021-06-09 MED ORDER — SODIUM CHLORIDE 0.9 % IV SOLN: Freq: Once | INTRAVENOUS | Status: AC

## 2021-06-09 MED ORDER — SODIUM CHLORIDE 0.9 % IV SOLN
200.0000 mg | Freq: Once | INTRAVENOUS | Status: AC
  Administered 2021-06-09: 200 mg via INTRAVENOUS
  Filled 2021-06-09: qty 8

## 2021-06-09 NOTE — Progress Notes (Signed)
Patient seen by [name] NP today  Vitals are within treatment parameters.  Labs reviewed by [name] NP and are within treatment parameters. Does not require CBC today.  OK to treat pending CXR results.  Per physician team, patient is ready for treatment and there are NO modifications to the treatment plan.

## 2021-06-09 NOTE — Progress Notes (Signed)
Patient presents for treatment. RN assessment completed along with the following:  Labs/vitals reviewed - Yes, and within treatment parameters.   Weight within 10% of previous measurement - Yes Informed consent completed and reflects current therapy/intent - Yes, on date 02/21/14             Provider progress note reviewed - Yes, today's provider note was reviewed. Treatment/Antibody/Supportive plan reviewed - Yes, and there are no adjustments needed for today's treatment. S&H and other orders reviewed - Yes, and there are no additional orders identified. Previous treatment date reviewed - Yes, and the appropriate amount of time has elapsed between treatments. Clinic Hand Off Received from - Cristy Friedlander, RN  Patient to proceed with treatment.

## 2021-06-09 NOTE — Patient Instructions (Signed)
[facility]   Discharge Instructions: Thank you for choosing [facility] to provide your oncology and hematology care.   If you have a lab appointment with the [facility], please go directly to the [facility] and check in at the registration area.   Wear comfortable clothing and clothing appropriate for easy access to any Portacath or PICC line.   We strive to give you quality time with your provider. You may need to reschedule your appointment if you arrive late (15 or more minutes).  Arriving late affects you and other patients whose appointments are after yours.  Also, if you miss three or more appointments without notifying the office, you may be dismissed from the clinic at the providers discretion.      For prescription refill requests, have your pharmacy contact our office and allow 72 hours for refills to be completed.    Today you received the following chemotherapy and/or immunotherapy agents Keytruda      To help prevent nausea and vomiting after your treatment, we encourage you to take your nausea medication as directed.  BELOW ARE SYMPTOMS THAT SHOULD BE REPORTED IMMEDIATELY: *FEVER GREATER THAN 100.4 F (38 C) OR HIGHER *CHILLS OR SWEATING *NAUSEA AND VOMITING THAT IS NOT CONTROLLED WITH YOUR NAUSEA MEDICATION *UNUSUAL SHORTNESS OF BREATH *UNUSUAL BRUISING OR BLEEDING *URINARY PROBLEMS (pain or burning when urinating, or frequent urination) *BOWEL PROBLEMS (unusual diarrhea, constipation, pain near the anus) TENDERNESS IN MOUTH AND THROAT WITH OR WITHOUT PRESENCE OF ULCERS (sore throat, sores in mouth, or a toothache) UNUSUAL RASH, SWELLING OR PAIN  UNUSUAL VAGINAL DISCHARGE OR ITCHING   Items with * indicate a potential emergency and should be followed up as soon as possible or go to the Emergency Department if any problems should occur.  Please show the CHEMOTHERAPY ALERT CARD or IMMUNOTHERAPY ALERT CARD at check-in to the  Emergency Department and triage nurse.  Should you have questions after your visit or need to cancel or reschedule your appointment, please contact [facility]  Dept: [phone]  and follow the prompts.  Office hours are 8:00 a.m. to 4:30 p.m. Monday - Friday. Please note that voicemails left after 4:00 p.m. may not be returned until the following business day.  We are closed weekends and major holidays. You have access to a nurse at all times for urgent questions. Please call the main number to the clinic Dept: [phone] and follow the prompts.   For any non-urgent questions, you may also contact your provider using MyChart. We now offer e-Visits for anyone 68 and older to request care online for non-urgent symptoms. For details visit [email or web address].   Also download the MyChart app! Go to the app store, search "MyChart", open the app, select [facility], and log in with your MyChart username and password.  Due to Covid, a mask is required upon entering the hospital/clinic. If you do not have a mask, one will be given to you upon arrival. For doctor visits, patients may have 1 support person aged 14 or older with them. For treatment visits, patients cannot have anyone with them due to current Covid guidelines and our immunocompromised population.   Pembrolizumab injection What is this medication? PEMBROLIZUMAB (pem broe liz ue mab) is a monoclonal antibody. It is used to treat certain types of cancer. This medicine may be used for other purposes; ask your health care provider or pharmacist if you have questions. COMMON BRAND NAME(S): [employer]  What should I tell my care team before I take this medication? They need to know if you have any of these conditions: autoimmune diseases like Crohn's disease, ulcerative colitis, or lupus have had or planning to have an allogeneic stem cell transplant (uses someone else's stem cells) history of organ  transplant history of chest radiation nervous system problems like myasthenia gravis or Guillain-Barre syndrome an unusual or allergic reaction to pembrolizumab, other medicines, foods, dyes, or preservatives pregnant or trying to get pregnant breast-feeding How should I use this medication? This medicine is for infusion into a vein. It is given by a health care professional in a hospital or clinic setting. A special MedGuide will be given to you before each treatment. Be sure to read this information carefully each time. Talk to your pediatrician regarding the use of this medicine in children. While this drug may be prescribed for children as young as 6 months for selected conditions, precautions do apply. Overdosage: If you think you have taken too much of this medicine contact a poison control center or emergency room at once. NOTE: This medicine is only for you. Do not share this medicine with others. What if I miss a dose? It is important not to miss your dose. Call your doctor or health care professional if you are unable to keep an appointment. What may interact with this medication? Interactions have not been studied. This list may not describe all possible interactions. Give your health care provider a list of all the medicines, herbs, non-prescription drugs, or dietary supplements you use. Also tell them if you smoke, drink alcohol, or use illegal drugs. Some items may interact with your medicine. What should I watch for while using this medication? Your condition will be monitored carefully while you are receiving this medicine. You may need blood work done while you are taking this medicine. Do not become pregnant while taking this medicine or for 4 months after stopping it. Women should inform their doctor if they wish to become pregnant or think they might be pregnant. There is a potential for serious side effects to an unborn child. Talk to your health care professional or  pharmacist for more information. Do not breast-feed an infant while taking this medicine or for 4 months after the last dose. What side effects may I notice from receiving this medication? Side effects that you should report to your doctor or health care professional as soon as possible: allergic reactions like skin rash, itching or hives, swelling of the face, lips, or tongue bloody or black, tarry breathing problems changes in vision chest pain chills confusion constipation cough diarrhea dizziness or feeling faint or lightheaded fast or irregular heartbeat fever flushing joint pain low blood counts - this medicine may decrease the number of white blood cells, red blood cells and platelets. You may be at increased risk for infections and bleeding. muscle pain muscle weakness pain, tingling, numbness in the hands or feet persistent headache redness, blistering, peeling or loosening of the skin, including inside the mouth signs and symptoms of high blood sugar such as dizziness; dry mouth; dry skin; fruity breath; nausea; stomach pain; increased hunger or thirst; increased urination signs and symptoms of kidney injury like trouble passing urine or change in the amount of urine signs and symptoms of liver injury like dark urine, light-colored stools, loss of appetite, nausea, right upper belly pain, yellowing of the eyes or skin sweating swollen lymph nodes weight loss Side effects that usually do not require  medical attention (report to your doctor or health care professional if they continue or are bothersome): decreased appetite hair loss tiredness This list may not describe all possible side effects. Call your doctor for medical advice about side effects. You may report side effects to FDA at 1-800-FDA-1088. Where should I keep my medication? This drug is given in a hospital or clinic and will not be stored at home. NOTE: This sheet is a summary. It may not cover all possible  information. If you have questions about this medicine, talk to your doctor, pharmacist, or health care provider.  2022 Elsevier/Gold Standard (2021-01-10 00:00:00)

## 2021-06-09 NOTE — Telephone Encounter (Signed)
-----   Message from Tanda Rockers, MD sent at 06/09/2021 12:37 PM EST ----- She is really hard to sort out - I'll try to get her back next week -  Magda Paganini : ok to add on but needs all active meds in hand ----- Message ----- From: Owens Shark, NP Sent: 06/09/2021  11:39 AM EST To: Tanda Rockers, MD  Hi Dr Melvyn Novas,  I saw Ms. Kugler in the office today for routine f/u prior to pembrolizumab. She appears unchanged but continues to c/o dyspnea with exertion (this has been ongoing for probably more than 6 weeks now, has not gotten any worse); no significant cough; had minimal wheezing on exam. Restaging chest CT 3 weeks ago was stable. She reports completing 2 courses of doxy and a course of steroids over the past 4 or so weeks. I got a chest xray today and it looks stable to me. Would you mind reviewing and letting us know your thoughts about the dyspnea?   Thanks, [name]

## 2021-06-09 NOTE — Progress Notes (Signed)
[facility] OFFICE PROGRESS NOTE   Diagnosis: Lung cancer, COPD  INTERVAL HISTORY:   Brooke [name] returns as scheduled.  She completed another cycle of Pembrolizumab 05/19/2021.  No rash or diarrhea.  Main complaint continues to be feeling more short of breath over the past approximate 6 weeks.  She feels "okay" at rest.  The dyspnea occurs with exertion.  Some wheezing.  She continues supplemental oxygen.  She completed a course of doxycycline and a Medrol Dosepak about 2 weeks ago with no significant change in the dyspnea.  She has a good appetite.  Objective:  Vital signs in last 24 hours:  Blood pressure (!) 145/75, pulse 89, temperature 97.8 F (36.6 C), temperature source Oral, resp. rate 18, height 5' 2.5" (1.588 m), weight 91 lb 3.2 oz (41.4 kg), SpO2 100 %.     Resp: Lungs clear posteriorly, distant.  Faint wheeze left upper anterior lung field.  No respiratory distress. Cardio: Regular rate and rhythm. GI: No hepatosplenomegaly. Vascular: No leg edema. Skin: No rash.   Lab Results:  Lab Results  Component Value Date   WBC 4.2 05/19/2021   HGB 12.9 05/19/2021   HCT 40.2 05/19/2021   MCV 89.9 05/19/2021   PLT 200 05/19/2021   NEUTROABS 3.0 05/19/2021    Imaging:  No results found.  Medications: I have reviewed the patient's current medications.  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% Left lung radiation 11/08/2016 through 11/21/2016 CT chest 12/12/2016-reexpansion of left upper lobe with a decreased left upper lobe mass,  unchanged mediastinal adenopathy, progression of a splenic metastasis/pancreatic tail/gastrohepatic ligament metastasis, right lower lobe pneumonia Cycle 1 Pembrolizumab 12/14/2016 Cycle 2 Pembrolizumab 01/03/2017 Cycle 3 Pembrolizumab 01/24/2017 Cycle 4 Pembrolizumab 02/12/2017 CT 03/05/2018-decrease in left upper lobe mass, mediastinal adenopathy, and splenic mass Cycle 5 pembrolizumab 03/07/2017 Cycle 6 Pembrolizumab 04/02/2017 Cycle 7 pembrolizumab 04/25/2017 Cycle 8 Pembrolizumab 05/14/2017 Cycle 9 Pembrolizumab 06/06/2017 CT 06/20/2017-mild decrease in left upper lobe mass, mild increase in paramediastinal left upper lobe nodule-radiation change?, decreased splenic metastasis Cycle 10 pembrolizumab 06/25/2017 Cycle 11 pembrolizumab 07/18/2017 Cycle 12 pembrolizumab 08/29/2017 Cycle 13 pembrolizumab 09/17/2017 Cycle 14 Pembrolizumab 10/10/2017 CT chest 10/24/2017- slight enlargement of small mediastinal lymph nodes, increased left upper lobe consolidation, decreased size of spleen lesion Cycle 15 pembrolizumab 10/29/2017 Cycle 16 Pembrolizumab 11/21/2017 Cycle 17 Pembrolizumab 12/11/2017 Cycle 18 pembrolizumab 01/02/2018 Cycle 19 Pembrolizumab 01/21/2018 Cycle 20 Pembrolizumab 02/13/2018 CT chest 02/27/2018- decreased left paratracheal node, progressive consolidation/fibrosis in the left upper lung, decreased size of splenic mass Cycle 21 pembrolizumab 03/04/2018 Cycle 22 Pembrolizumab 03/27/2018  Cycle 23 pembrolizumab 04/15/2018 Cycle 24 pembrolizumab 05/08/2018 Cycle 25 Pembrolizumab 05/27/2018 CT chest 06/16/2018- stable left upper lung radiation fibrosis with no evidence of local tumor recurrence.  Decreased left paratracheal adenopathy.  No new or progressive metastatic disease in the chest.  Gastrohepatic ligament lymphadenopathy stable.  Splenic metastasis decreased.  T5 vertebral compression fracture with associated patchy sclerosis and no discrete osseous lesion, new in the interval. Cycle  26 Pembrolizumab 06/19/2018 Cycle 27 pembrolizumab 07/08/2018 Cycle 28 pembrolizumab 07/31/2018 Cycle 29 pembrolizumab 08/19/2018 Cycle 30 Pembrolizumab 09/11/2018 Cycle 31 pembrolizumab 09/30/2018 Cycle 32 pembrolizumab 10/23/2018 CT chest 10/28/2018- left  apical pleural-parenchymal opacity consistent with radiation scarring has become more confluent likely representing evolutionary change.  Continued further decrease in size of index left paratracheal node and splenic metastasis.  Gastrohepatic ligament lymph node not changed. Cycle 33 Pembrolizumab 11/11/2018 Cycle 34 pembrolizumab 12/04/2018 Cycle 35 pembrolizumab 12/23/2018 CT chest 02/12/2019- posttreatment changes left upper lobe unchanged.  Continued decrease in size of splenic metastasis.  Stable appearance of left paratracheal lymph nodes.  Stable gastrohepatic adenopathy.  Incomplete visualization of retroperitoneal lymph nodes in the upper abdomen. CT chest 07/02/2019-stable post treatment related changes left lung.  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  Slight regression of enlarged likely metastatic gastrohepatic lymph node. CT chest 11/11/2019-interval enlargement of a right paratracheal lymph node measuring 14 mm, increased from 5 mm.  No new lung mass or nodularity.  New adenopathy in the upper abdomen.  2.7 cm lesion gastrohepatic ligament.  Necrotic node at the splenic hilum measures 4.7 cm.  Large node along the IVC left upper quadrant measures 2.7 cm.  Similar node left adjacent the aorta measures 1.8 cm.  Enhancing lesion in the spleen is unchanged.  Several low-density lesions in the liver are unchanged. Pembrolizumab resumed on a 3-week schedule 12/03/2019 CT chest 02/22/2020-interval resolution of previously demonstrated left paratracheal and upper abdominal adenopathy.  Stable treated metastasis within the spleen.  Stable radiation changes in the left lung with left hilar distortion.  No evidence of local recurrence or progressive  metastatic disease. Continuation of pembrolizumab every 3 weeks 02/26/2020 CT chest 06/28/2020-no change in left upper lobe consolidation, unchanged spleen metastasis, no evidence of progressive disease Pembrolizumab continued every 3 weeks 07/01/2020 CT chest 11/02/2020-stable posttreatment changes at the left hilum and left upper lobe, decreased size of spleen lesion, no evidence of disease progression Pembrolizumab every 3 weeks continued CT chest 01/12/2021-negative for acute pulmonary embolus.  Stable radiation fibrosis left upper lobe.  Stable low-density lesion in the spleen.  No new or progressive findings. 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  Splenic lesion stable.  No upper abdominal lymphadenopathy. Pembrolizumab every 3 weeks continued     2.  05/29/2019 laryngoscopy-exophytic appearing mass mid right vocal cord.   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  Tumor cells negative for p16, CMV and HSV 2.  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 Radiation 07/29/2019-08/25/2019   3. History of acute respiratory failure secondary to #1   4. Oxygen dependent COPD   5. History of a NSTEMI 2015   6. Right lung pneumonia on chest CT 12/12/2016-treated with Levaquin   7.  Grade 2 rash possibly related to Pembrolizumab.  Treatment held 08/06/2017.  Improved 08/29/2017, treatment resumed, rash has resolved   8.  Vertigo January 2022-improved with course of Augmentin for ear/sinus infection    Disposition: Ms. Cerniglia appears unchanged.  She continues to have shortness of breath but reports this is unchanged over about 6 weeks.  We decided to go ahead with Pembrolizumab today as scheduled.  We will obtain a chest  x-ray.  The dyspnea may be related to COPD.  Chemistry panel reviewed, adequate to proceed with Pembrolizumab.  She will return for lab, follow-up Pembrolizumab in 3 weeks.  As noted above the dyspnea is largely unchanged over many weeks.  Plan for chest x-ray  today.  She understands to contact the office with worsening dyspnea or other new symptoms.    [name] ANP/GNP-BC   06/09/2021  9:06 AM

## 2021-06-09 NOTE — Telephone Encounter (Signed)
Spoke with the pt and scheduled her for Tues 2/7 at 1:45 with Dr Melvyn Novas and advised to bring all meds.

## 2021-06-13 ENCOUNTER — Other Ambulatory Visit (HOSPITAL_BASED_OUTPATIENT_CLINIC_OR_DEPARTMENT_OTHER): Payer: Medicare Other

## 2021-06-13 ENCOUNTER — Ambulatory Visit: Payer: Medicare Other | Admitting: Internal Medicine

## 2021-06-15 ENCOUNTER — Other Ambulatory Visit: Payer: Self-pay

## 2021-06-15 ENCOUNTER — Ambulatory Visit (INDEPENDENT_AMBULATORY_CARE_PROVIDER_SITE_OTHER): Payer: Medicare Other | Admitting: Internal Medicine

## 2021-06-15 ENCOUNTER — Encounter: Payer: Self-pay | Admitting: Internal Medicine

## 2021-06-15 VITALS — BP 122/80 | HR 84 | Temp 97.8°F | Ht 62.5 in | Wt 89.8 lb

## 2021-06-15 DIAGNOSIS — R0789 Other chest pain: Secondary | ICD-10-CM | POA: Diagnosis not present

## 2021-06-15 DIAGNOSIS — J9611 Chronic respiratory failure with hypoxia: Secondary | ICD-10-CM | POA: Diagnosis not present

## 2021-06-15 DIAGNOSIS — J449 Chronic obstructive pulmonary disease, unspecified: Secondary | ICD-10-CM | POA: Diagnosis not present

## 2021-06-15 MED ORDER — METHYLPREDNISOLONE ACETATE 80 MG/ML IJ SUSP
120.0000 mg | Freq: Once | INTRAMUSCULAR | Status: AC
  Administered 2021-06-15: 120 mg via INTRAMUSCULAR

## 2021-06-15 MED ORDER — PANTOPRAZOLE SODIUM 40 MG PO TBEC: 40.0000 mg | DELAYED_RELEASE_TABLET | Freq: Every day | ORAL | 2 refills | Status: DC

## 2021-06-15 NOTE — Progress Notes (Signed)
Subjective:    Patient ID: Brooke [name], female   DOB: January 01, 1946,    MRN: [id number]     Brief patient profile:  20 yowf quit smoking 2009 on 02 noct 2012 maint on symbicort 160 since aorund  2014 referred to pulmonary clinic 02/15/2016 by Dr   Kathryne Eriksson for RUL Lung mass dx with Stage IV nsc 11/01/16 in setting of GOLD IV copd      History of Present Illness  02/15/2016 1st [facility] office visit/ Brooke [name]  GOLD IV copd/ symb 160 2bid maint  Chief Complaint  Patient presents with   Pulmonary Consult    referred by Dr. Kathryne Eriksson for COPD.  MMRC2 = can't walk a nl pace on a flat grade s sob but does fine slow and flat eg [employer] on 2lpm  New onset bloody mucus plugs November 11 2015 assoc with wt loss  02 2lpm hs and with walking / rare saba needed rec For cough > mucinex up to 1200 mg every 12 hours as needed Rec:  Fob but refused   11/01/16   CT directed bx >>>  Pos NSC LUL    Diagnosis:   Stage IV NSCLC, Squamous cell carcinoma of the left upper lobe.      Indication for treatment:  palliative        Radiation treatment dates:   11/08/2016 to 11/21/2016   Site/dose:   The Left lung was treated to 30 Gy in 10 fractions at 3 Gy per fraction.    Beams/energy:   3D // 10X, 6X    12/06/2016  f/u ov/Brooke [name] re: post hosp f/u -  Now on dulera 200 bid and duoneb Chief Complaint  Patient presents with   HFU    Breathing is doing well. She is using Duoneb 2 x daily and uses albuterol inhaler 1-2 x per wk on average.   presently in SNF ever since last admit 6/18-29/18 for  Active Problems:   COPD  GOLD IV    Mass of upper lobe of left lung   Chronic respiratory failure with hypoxia (HCC)   COPD exacerbation (HCC)   Hypokalemia   Microcytic anemia   SOB (shortness of breath)   Protein-calorie malnutrition, severe Doe = 2lpm x 196ft with rolling walking  Sleeping ok at < 30 degrees Doe = 2lpm x 136ft with rolling walker/2lpm Sleeping ok at < 30 degrees   rec 02 can be adjusted down and off for saturations above 90%       12/09/2017  f/u ov/Brooke [name] re: GOLD IV copd/  No 02 at rest/ 2lpm sleep and with activity  Chief Complaint  Patient presents with   Follow-up    Breathing is overall doing well. She has occ cough with white sputum- relates to PND.  She uses her albuterol inhaler 2 x daily on average. She states she uses neb before she knows she is going somewhere.   Dyspnea:  MMRC2 = can't walk a nl pace on a flat grade s sob but does fine slow and flat eg still pushing cart  Cough: min pnd  SABA use: as above  rec Continue  symbicort and add stiolto 2 pffs each am - - if you don't like it for any reason resume the symbicort alone as per your med calendar  Please schedule a follow up visit in 3 months but call sooner if needed  - add:  Pt notified above typo should have said add spiriva x  2 pffs each am and continue symbicort Take 2 puffs first thing in am and then another 2 puffs about 12 hours later.   - 12/12/2017 informed could not tol spiriva due to dry mouth   03/14/2018  f/u ov/Brooke [name] re:  GOLD IV / 02 is 2lpm  Hs and 2lpm exertion/   RA sitting / just on symbicort 160 2bid  Chief Complaint  Patient presents with   Follow-up    increased cough with white sputum. She states her breathing has been doing well. She is using her albuterol inhaler 2 x per wk and neb maybe 2 x  per wk on average.    Dyspnea:  [employer] ok pushing cart on 2lpm  Cough: more since 2 weeks  Sleeping: flat bed/ 2-3 pillow  SABA use: avg as above 02: as above   rec Work on inhaler technique:   Ok to take augmentin unless it makes you nauseated  Please schedule a follow up visit in 3 months but call sooner if needed      06/13/2018  f/u ov/Brooke [name] re: GOLD IV / 02 hs and prn  Chief Complaint  Patient presents with   Shortness of Breath    Worse since the last visit.  Dyspnea:  Still doing [employer]  On 2lpm but not checking sats as rec with activity  Cough:  dry worse day than noct Sleeping: falt bed / couple of pillows SABA use: helps some / uses once a day esp in am 02: 2lpm hs and prn  rec Plan A = Automatic = symbicort 160 Take 2 puffs first thing in am and then another 2 puffs about 12 hours later.  Work on inhaler technique: Plan B = Backup Only use your albuterol inhaler as a rescue medication Plan C = Crisis - only use your albuterol/ipatropium nebulizer if you first try Plan B and it fails to help > ok to use the nebulizer up to every 4 hours but if start needing it regularly call for immediate appointment Please schedule a follow up visit in 3 months but call sooner if needed  - add chart review does not show alpha one screen done     11/30/220 televisit, new hoarse rec  gerd rx > ENT F/u > sq cell ca vc    Last RT April 20th 2021 in [place]    06/06/2020  f/u ov/Brooke [name] re:  GOLD IV / 02 dep  Chief Complaint  Patient presents with   Follow-up    Sob same, vertigo, did not start Breztri yet   Dyspnea:  Still doing 60 ft circuit x 30 most days   Slowed by "asthma feeling" on symbicort  Cough: very hoarse, no excess mucus but some pnds  Sleeping:  Bed is flat and 2 pillows  SABA use: both hfa and nebs but never pre or rechallenges   02: 2lpm 24/7 does not check sats at peak ex rec Try albuterol 15 min before an activity that you know would make you short of breath and see if it makes any difference and if makes none then don't take it after activity unless you can't catch your breath. Start Breztri 2 in am and symbicort x 2 pffs 12 hours later - once you finish the symbicort, use breztri just like you did the symbicort  Make sure you check your oxygen saturation  at your highest level of activity     Referred to pulmonary rehab 08/22/20  > has not  heard from rehab as of 09/12/2020     09/12/2020  f/u ov/Brooke [name] re: GOLD IV / 02 dep  Chief Complaint  Patient presents with   Follow-up    Breathing has improved some on  Breztri. Her cough has been more prod over the past few days- yellow to white sputum.  She is using her albuterol inhaler and neb with albuterol both 1-2 x per day.   Dyspnea:  Still doing 60 ft circuit  X 30 not really sure it helps  Cough: was yellow now but still thick p recent augmentin rx  Sleeping: bed is flat and has 2 pillows no resp problems  SABA use: as above but  none noct  02: 2lpm  Covid status:  vax x 3   Rec Depomedrol 120 mg IM today No change other  medications Make sure you check your oxygen saturation  at your highest level of activity    03/20/2021  f/u ov/Brooke [name] re: GOLD 4/ 02 dep  maint on symbicort 160 2bid  /need alpha one screen/    Chief Complaint  Patient presents with   Follow-up    COPD  Dyspnea:  100 ft not checking sats  Cough: back  to yellow again - was some better p amox Sleeping: 45 degrees  SABA use: using hfa and duoneb  02: 1- 2lpm 24/7 and drops  Covid status:   3 vax  Rec Depomedrol 120 mg  Plan A = Automatic = Always=    Symbicort 160 Take 2 puffs first thing in am and then another 2 puffs about 12 hours later.  Work on inhaler technique:  Plan B = Backup (to supplement plan A, not to replace it) Only use your albuterol inhaler as a rescue medication Plan C = Crisis (instead of Plan B but only if Plan B stops working) - only use your albuterol/ipatroprium nebulizer if you first try Plan B and it fails to help > ok to use the nebulizer up to every 4 hours but if start needing it regularly call for immediate appointment Plan D =  Depomerol 120 mg IM  or at home medrol 4 mg  x  4 x 2 days, 2 x days and 1 x 2days and off For cough > guaifensin up to 1200 mg every 12 hours and use the flutter as much as possible  Try prilosec otc 20mg   Take 30-60 min before first meal of the day and Pepcid ac (famotidine) 20 mg one @  bedtime until cough is completely gone for at least a week without the need for cough suppression Make sure you check your oxygen  saturation  AT  your highest level of activity (not after you stop)      05/11/2021  f/u ov/Brooke [name] re: Girtha Rm 4/02 dep    maint on symb 160  / did not start gerd rx as rec  Chief Complaint  Patient presents with   Follow-up    Breathing is "different" c/o cough- non prod    Dyspnea:  100 ft baseline / not checking sats or titrating 02 to > 90 as rec  Cough: worse since last ov occ produces very small yellowish  lump No Better with abx / has not tried medrol  or increased saba to see if they help. Sleeping: flat bed  lots of pillows s resp   SABA use: no increase  02: 2lpm hs and not at rest  Covid status:  vax x 3   Rec Try  prilosec otc 20mg   Take 30-60 min before first meal of the day and Pepcid ac (famotidine) 20 mg one @  bedtime until cough is completely gone for at least a week without the need for cough suppression GERD diet reviewed, bed blocks rec  Change your mucinex to mucinex dm 1200 mg twice daily as needed plus flutter valve  Make sure you check your oxygen saturation  AT  your highest level of activity (not after you stop)      06/15/2021  f/u ov/Brooke [name] re: GOLD 4/02 dep maint on symb 160   Chief Complaint  Patient presents with   Follow-up    C/o sob not getting any better for 5 wks., energy decreased, cough-white, frothy,wheezing   Dyspnea:   100 ft with sats  2lpm stay above 90% per pt Cough: min white  Sleeping: sitting up mostly SABA use: duoneb  02: 2lpm prn  Persistent new Bilateral positional Mscp  x  2 weeks T 8 level but no spine pain but classic C shape symmetric pattern which is only worse on one side vs the other in different positions   No obvious day to day or daytime variability or assoc  purulent sputum or mucus plugs or hemoptysis or cp or chest tightness, subjective wheeze or overt sinus or hb symptoms.   Sleeping as above  without nocturnal  or early am exacerbation  of respiratory  c/o's or need for noct saba. Also denies any obvious fluctuation of symptoms  with weather or environmental changes or other aggravating or alleviating factors except as outlined above   No unusual exposure hx or h/o childhood pna/ asthma or knowledge of premature birth.  Current Allergies, Complete Past Medical History, Past Surgical History, Family History, and Social History were reviewed in [employer] record.  ROS  The following are not active complaints unless bolded Hoarseness, sore throat, dysphagia, dental problems, itching, sneezing,  nasal congestion or discharge of excess mucus or purulent secretions, ear ache,   fever, chills, sweats, unintended wt loss or wt gain, classically pleuritic or exertional cp,  orthopnea pnd or arm/hand swelling  or leg swelling, presyncope, palpitations, abdominal pain, anorexia, nausea, vomiting, diarrhea  or change in bowel habits or change in bladder habits, change in stools or change in urine, dysuria, hematuria,  rash, arthralgias, visual complaints, headache, numbness, weakness or ataxia or problems with walking or coordination,  change in mood or  memory.        Current Meds  Medication Sig   acetaminophen (TYLENOL) 325 MG tablet Per bottle as needed   albuterol (VENTOLIN HFA) 108 (90 Base) MCG/ACT inhaler Inhale into the lungs.   ALPRAZolam (XANAX) 0.25 MG tablet 1/2-1 twice daily as needed   amLODipine (NORVASC) 2.5 MG tablet Take by mouth based on top number of BP. 1 tab (2.5mg ) if BP 140-159, 2 tabs (5mg ) for 160-179, 3 tabs (7.5 mg) for BP >180   Apoaequorin (PREVAGEN) 10 MG CAPS Take 10 mg by mouth daily.   atorvastatin (LIPITOR) 20 MG tablet Take 2 tablets (40 mg total) by mouth daily. NEEDS 20 MG TABLETS SECONDARY TO SWALLOWING ISSUES   budesonide-formoterol (SYMBICORT) 160-4.5 MCG/ACT inhaler Inhale 2 puffs into the lungs 2 (two) times daily.   guaiFENesin (MUCINEX) 600 MG 12 hr tablet Take 600 mg by mouth 2 (two) times daily as needed for cough or to loosen phlegm.   ipratropium-albuterol  (DUONEB) 0.5-2.5 (3) MG/3ML SOLN Take 3 mLs by nebulization every 4 (four)  hours as needed (wheezing/shortness of breath). **PLAN C**   nitroGLYCERIN (NITROSTAT) 0.4 MG SL tablet ONE TABLET UNDER TONGUE AS NEEDED FOR CHEST PAIN EVERY 5 MINUTES FOR 3 DOSES   OVER THE COUNTER MEDICATION Total Beets1 tablet daily.   OVER THE COUNTER MEDICATION Aspercream as needed   OXYGEN 2lpm 24/7   Probiotic Product (PROBIOTIC ADVANCED PO) Take 1 tablet by mouth daily.   Simethicone 180 MG CAPS Use as needed   Current Facility-Administered Medications for the 06/15/21 encounter (Office Visit) with Tanda Rockers, MD  Medication   methylPREDNISolone acetate (DEPO-MEDROL) injection 120 mg   methylPREDNISolone acetate (DEPO-MEDROL) injection 120 mg           Objective:   Physical Exam  Wts  06/15/2021        89  05/11/2021        92 03/20/2021    96   09/12/2020         96 06/06/2020        100 01/18/2020          92 10/16/2019          93  03/05/2019      99  06/13/2018         123  03/14/2018       127  12/09/2017         125   06/10/2017         119  01/08/2017         112   12/06/2016        110   02/15/16 119 lb (54 kg)  08/18/15 137 lb 12.8 oz (62.5 kg)  02/17/15 138 lb 1.9 oz (62.7 kg)    Vital signs reviewed  06/15/2021  - Note at rest 02 sats  96% on RA   General appearance:    amb wf chronically ill   HEENT : pt wearing mask not removed for exam due to covid -19 concerns.    NECK :  without JVD/Nodes/TM/ nl carotid upstrokes bilaterally   LUNGS: no acc muscle use,  Mod barrel  contour chest wall with bilateral  Distant bs s audible wheeze and  without cough on insp or exp maneuvers and mod  Hyperresonant  to  percussion bilaterally     CV:  RRR  no s3 or murmur or increase in P2, and no edema   ABD:  soft and nontender with pos mid insp Hoover's  in the supine position. No bruits or organomegaly appreciated, bowel sounds nl  MS:     ext warm without deformities, calf tenderness, cyanosis or  clubbing No obvious joint restrictions   SKIN: warm and dry without lesions    NEURO:  alert, approp, nl sensorium with  no motor or cerebellar deficits apparent.        I personally reviewed images and agree with radiology impression as follows:  CXR:   pa and lat 06/09/21 1. No evidence of new/acute cardiopulmonary disease. 2. Chronic masslike opacity at the left lung apex, better characterized on recent CT chest. 3. Chronic hyperinflation, compatible with emphysema/COPD.  CT chest with contrast  05/17/21  1. 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 No findings to suggest local recurrence of disease or definite metastatic disease in the thorax. 2. Diffuse bronchial wall thickening with moderate centrilobular and paraseptal emphysema; imaging findings suggestive of underlying COPD. 3. Aortic atherosclerosis, in addition to left main and three-vessel coronary artery  disease. Assessment for potential risk factor modification, dietary therapy or pharmacologic therapy may be warranted, if clinically indicated. 4. Compression fractures of T6 and T8, new (but nonacute) at T8  5. Additional incidental findings, as above.   Assessment:

## 2021-06-15 NOTE — Patient Instructions (Addendum)
Depomedrol 120 mg IM   Prilosec 20 x  2  = Pantoprazole (protonix) 40 mg   Take  30-60 min before first meal of the day and continue  Pepcid (famotidine)  20 mg after supper until return to office - this is the best way to tell whether stomach acid is contributing to your problem.    I will be referring you  for orthopedic evaluation for your Thoracic spine fracture  Please schedule a follow up visit in 3 months but call sooner if needed

## 2021-06-18 ENCOUNTER — Encounter: Payer: Self-pay | Admitting: Internal Medicine

## 2021-06-18 DIAGNOSIS — R0789 Other chest pain: Secondary | ICD-10-CM | POA: Insufficient documentation

## 2021-06-18 NOTE — Assessment & Plan Note (Signed)
Compression fractures of T6 and T8, by CT 05/17/21  - referred to ortho 06/15/2021   Has seen Nitka in past so refer back to his group.          Each maintenance medication was reviewed in detail including emphasizing most importantly the difference between maintenance and prns and under what circumstances the prns are to be triggered using an action plan format where appropriate.  Total time for H and P, chart review, counseling, reviewing hfa/ neb/02 device(s) and generating customized AVS unique to this office visit / same day charting = > 30 min

## 2021-06-18 NOTE — Assessment & Plan Note (Signed)
Quit smoking 2009 Spirometry 02/15/2016  FEV1 0.60 (28%)  Ratio 38 with classic curvature  p am symbicort 160  - 01/08/2017    continue dulera 200 2bid  - 12/09/2017  After extensive coaching inhaler device  effectiveness =    90% with smi >add spiriva to  symbicort  - 12/12/2017 informed could not tol spiriva due to dry mouth - - 06/13/2018   continue symbicort 160 - 01/18/2020  After extensive coaching inhaler device,  effectiveness =  90%  - 06/06/2020 changed to breztri  Referred to pulmonary rehab 08/22/20  > has not heard from rehab as of 09/12/2020    - 03/20/2021  After extensive coaching inhaler device,  effectiveness =    75%  - 03/20/21      alpha one AT phenotype  MM 161  Added flutter valve 05/11/2021    Group D in terms of symptom/risk and laba/lama/ICS  therefore appropriate rx at this point >>>  breztri rec but now maint on symb 160 2bid and prn saba and relatively well compenstated

## 2021-06-18 NOTE — Assessment & Plan Note (Signed)
Rx 2lpm hs and prn daytime since 2014  -  06/13/2018   Walked on 2lpm x  3 laps @  approx 262ft each @ nl pace  stopped due to  Sob after each lap  s desat   - 05/11/2021   Walked on RA  x  1  lap(s) =  approx 250  ft  @ avg pace, stopped due to sob/ back pain with lowest 02 sats 95%    Again advised: Make sure you check your oxygen saturation  AT  your highest level of activity (not after you stop)   to be sure it stays over 90% and adjust  02 flow upward to maintain this level if needed but remember to turn it back to previous settings when you stop (to conserve your supply).

## 2021-06-22 ENCOUNTER — Ambulatory Visit (HOSPITAL_BASED_OUTPATIENT_CLINIC_OR_DEPARTMENT_OTHER)
Admission: RE | Admit: 2021-06-22 | Discharge: 2021-06-22 | Disposition: A | Discharge status: Home / Self Care | Payer: Medicare Other | Source: Ambulatory Visit | Attending: Oncology | Admitting: Oncology

## 2021-06-22 ENCOUNTER — Other Ambulatory Visit: Payer: Self-pay

## 2021-06-22 DIAGNOSIS — M810 Age-related osteoporosis without current pathological fracture: Secondary | ICD-10-CM | POA: Diagnosis not present

## 2021-06-22 DIAGNOSIS — Z13820 Encounter for screening for osteoporosis: Secondary | ICD-10-CM | POA: Diagnosis not present

## 2021-06-22 DIAGNOSIS — C3412 Malignant neoplasm of upper lobe, left bronchus or lung: Secondary | ICD-10-CM | POA: Diagnosis not present

## 2021-06-22 DIAGNOSIS — Z780 Asymptomatic menopausal state: Secondary | ICD-10-CM | POA: Diagnosis not present

## 2021-06-23 ENCOUNTER — Telehealth: Payer: Self-pay

## 2021-06-23 NOTE — Telephone Encounter (Signed)
Pt verbalized understanding. Stated she was going to make an appointment with PCP

## 2021-06-23 NOTE — Telephone Encounter (Signed)
-----   Message from [name], MD sent at 06/22/2021  4:56 PM EST ----- Please call patient, bone density scan consistent with osteoporosis, she should follow-up with her primary provider for management of osteoporosis

## 2021-06-25 ENCOUNTER — Other Ambulatory Visit: Payer: Self-pay | Admitting: Oncology

## 2021-06-30 ENCOUNTER — Other Ambulatory Visit: Payer: Self-pay

## 2021-06-30 ENCOUNTER — Encounter: Payer: Self-pay | Admitting: *Deleted

## 2021-06-30 ENCOUNTER — Inpatient Hospital Stay (HOSPITAL_BASED_OUTPATIENT_CLINIC_OR_DEPARTMENT_OTHER): Payer: Medicare Other | Admitting: Oncology

## 2021-06-30 ENCOUNTER — Inpatient Hospital Stay: Payer: Medicare Other

## 2021-06-30 VITALS — BP 139/82 | HR 96 | Temp 98.7°F | Resp 18 | Ht 62.0 in | Wt 89.4 lb

## 2021-06-30 DIAGNOSIS — C3412 Malignant neoplasm of upper lobe, left bronchus or lung: Secondary | ICD-10-CM

## 2021-06-30 LAB — CMP (CANCER CENTER ONLY)
ALT: 16 U/L (ref 0–44)
AST: 16 U/L (ref 15–41)
Albumin: 4.2 g/dL (ref 3.5–5.0)
Alkaline Phosphatase: 65 U/L (ref 38–126)
Anion gap: 8 (ref 5–15)
BUN: 16 mg/dL (ref 8–23)
CO2: 29 mmol/L (ref 22–32)
Calcium: 9.4 mg/dL (ref 8.9–10.3)
Chloride: 99 mmol/L (ref 98–111)
Creatinine: 0.47 mg/dL (ref 0.44–1.00)
GFR, Estimated: >60 mL/min (ref >60)
Glucose, Bld: 88 mg/dL (ref 70–99)
Potassium: 4 mmol/L (ref 3.5–5.1)
Sodium: 136 mmol/L (ref 135–145)
Total Bilirubin: 0.5 mg/dL (ref 0.3–1.2)
Total Protein: 6.4 g/dL — ABNORMAL LOW (ref 6.5–8.1)

## 2021-06-30 LAB — CBC WITH DIFFERENTIAL (CANCER CENTER ONLY)
Abs Immature Granulocytes: 0.01 10*3/uL (ref 0.00–0.07)
Basophils Absolute: 0 10*3/uL (ref 0.0–0.1)
Basophils Relative: 1 %
Eosinophils Absolute: 0.1 10*3/uL (ref 0.0–0.5)
Eosinophils Relative: 2 %
HCT: 42.1 % (ref 36.0–46.0)
Hemoglobin: 13.5 g/dL (ref 12.0–15.0)
Immature Granulocytes: 0 %
Lymphocytes Relative: 19 %
Lymphs Abs: 0.9 10*3/uL (ref 0.7–4.0)
MCH: 28.9 pg (ref 26.0–34.0)
MCHC: 32.1 g/dL (ref 30.0–36.0)
MCV: 90.1 fL (ref 80.0–100.0)
Monocytes Absolute: 0.4 10*3/uL (ref 0.1–1.0)
Monocytes Relative: 9 %
Neutro Abs: 3.1 10*3/uL (ref 1.7–7.7)
Neutrophils Relative %: 69 %
Platelet Count: 230 10*3/uL (ref 150–400)
RBC: 4.67 MIL/uL (ref 3.87–5.11)
RDW: 13.4 % (ref 11.5–15.5)
WBC Count: 4.4 10*3/uL (ref 4.0–10.5)
nRBC: 0 % (ref 0.0–0.2)

## 2021-06-30 MED ORDER — SODIUM CHLORIDE 0.9 % IV SOLN
200.0000 mg | Freq: Once | INTRAVENOUS | Status: AC
  Administered 2021-06-30: 200 mg via INTRAVENOUS
  Filled 2021-06-30: qty 8

## 2021-06-30 MED ORDER — SODIUM CHLORIDE 0.9 % IV SOLN: Freq: Once | INTRAVENOUS | Status: AC

## 2021-06-30 NOTE — Progress Notes (Signed)
Patient seen by Dr. Sherrill today ? ?Vitals are within treatment parameters. ? ?Labs reviewed by Dr. Sherrill and are within treatment parameters. ? ?Per physician team, patient is ready for treatment and there are NO modifications to the treatment plan.  ?

## 2021-06-30 NOTE — Progress Notes (Signed)
[facility] OFFICE PROGRESS NOTE   Diagnosis: Non-small cell lung cancer  INTERVAL HISTORY:   Brooke [name] returns as scheduled.  She completed another treatment pembrolizumab on 06/09/2021.  She complains of increased malaise.  Good appetite.  She has discomfort in the left lateral abdomen.  She saw Brooke [name] 06/15/2021.  She was given a Solu-Medrol injection.  She has discomfort in the left upper and lower lateral abdomen.  No back pain.  No rash or diarrhea.  Objective:  Vital signs in last 24 hours:  Blood pressure 139/82, pulse 96, temperature 98.7 F (37.1 C), temperature source Oral, resp. rate 18, height '5\' 2"'  (1.575 m), weight 89 lb 6.4 oz (40.6 kg), SpO2 100 %.    Resp: Bronchial sounds at the left upper posterior chest, no respiratory distress Cardio: Regular rate and rhythm GI: No hepatosplenomegaly, tender in the lateral left subcostal region and lateral low left abdomen.  No mass. Vascular: No leg edema    Lab Results:  Lab Results  Component Value Date   WBC 4.4 06/30/2021   HGB 13.5 06/30/2021   HCT 42.1 06/30/2021   MCV 90.1 06/30/2021   PLT 230 06/30/2021   NEUTROABS 3.1 06/30/2021    CMP  Lab Results  Component Value Date   NA 136 06/30/2021   K 4.0 06/30/2021   CL 99 06/30/2021   CO2 29 06/30/2021   GLUCOSE 88 06/30/2021   BUN 16 06/30/2021   CREATININE 0.47 06/30/2021   CALCIUM 9.4 06/30/2021   PROT 6.4 (L) 06/30/2021   ALBUMIN 4.2 06/30/2021   AST 16 06/30/2021   ALT 16 06/30/2021   ALKPHOS 65 06/30/2021   BILITOT 0.5 06/30/2021   GFRNONAA >60 06/30/2021   GFRAA >60 02/05/2020     Medications: I have reviewed the patient's current medications.   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 PDL1 60% Left lung radiation 11/08/2016 through 11/21/2016 CT chest 12/12/2016-reexpansion of left upper lobe with a decreased left upper lobe mass, unchanged mediastinal adenopathy, progression of a splenic metastasis/pancreatic tail/gastrohepatic ligament metastasis, right lower lobe pneumonia Cycle 1 Pembrolizumab 12/14/2016 Cycle 2 Pembrolizumab 01/03/2017 Cycle 3 Pembrolizumab 01/24/2017 Cycle 4 Pembrolizumab 02/12/2017 CT 03/05/2018-decrease in left upper lobe mass, mediastinal adenopathy, and splenic mass Cycle 5 pembrolizumab 03/07/2017 Cycle 6 Pembrolizumab 04/02/2017 Cycle 7 pembrolizumab 04/25/2017 Cycle 8 Pembrolizumab 05/14/2017 Cycle 9 Pembrolizumab 06/06/2017 CT 06/20/2017-mild decrease in left upper lobe mass, mild increase in paramediastinal left upper lobe nodule-radiation change?, decreased splenic metastasis Cycle 10 pembrolizumab 06/25/2017 Cycle 11 pembrolizumab 07/18/2017 Cycle 12 pembrolizumab 08/29/2017 Cycle 13 pembrolizumab 09/17/2017 Cycle 14 Pembrolizumab 10/10/2017 CT chest 10/24/2017- slight enlargement of small mediastinal lymph nodes, increased left upper lobe consolidation, decreased size of spleen lesion Cycle 15 pembrolizumab 10/29/2017 Cycle 16 Pembrolizumab 11/21/2017 Cycle 17 Pembrolizumab 12/11/2017 Cycle 18 pembrolizumab 01/02/2018 Cycle 19 Pembrolizumab 01/21/2018 Cycle 20 Pembrolizumab 02/13/2018 CT chest 02/27/2018- decreased left paratracheal node, progressive consolidation/fibrosis in the left upper lung, decreased size of splenic mass Cycle 21 pembrolizumab 03/04/2018 Cycle 22 Pembrolizumab 03/27/2018  Cycle 23 pembrolizumab 04/15/2018 Cycle 24 pembrolizumab 05/08/2018 Cycle 25 Pembrolizumab 05/27/2018 CT chest 06/16/2018- stable left upper lung radiation fibrosis with no  evidence of  local tumor recurrence.  Decreased left paratracheal adenopathy.  No new or progressive metastatic disease in the chest.  Gastrohepatic ligament lymphadenopathy stable.  Splenic metastasis decreased.  T5 vertebral compression fracture with associated patchy sclerosis and no discrete osseous lesion, new in the interval. Cycle 26 Pembrolizumab 06/19/2018 Cycle 27 pembrolizumab 07/08/2018 Cycle 28 pembrolizumab 07/31/2018 Cycle 29 pembrolizumab 08/19/2018 Cycle 30 Pembrolizumab 09/11/2018 Cycle 31 pembrolizumab 09/30/2018 Cycle 32 pembrolizumab 10/23/2018 CT chest 10/28/2018- left apical pleural-parenchymal opacity consistent with radiation scarring has become more confluent likely representing evolutionary change.  Continued further decrease in size of index left paratracheal node and splenic metastasis.  Gastrohepatic ligament lymph node not changed. Cycle 33 Pembrolizumab 11/11/2018 Cycle 34 pembrolizumab 12/04/2018 Cycle 35 pembrolizumab 12/23/2018 CT chest 02/12/2019- posttreatment changes left upper lobe unchanged.  Continued decrease in size of splenic metastasis.  Stable appearance of left paratracheal lymph nodes.  Stable gastrohepatic adenopathy.  Incomplete visualization of retroperitoneal lymph nodes in the upper abdomen. CT chest 07/02/2019-stable post treatment related changes left lung.  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  Slight regression of enlarged likely metastatic gastrohepatic lymph node. CT chest 11/11/2019-interval enlargement of a right paratracheal lymph node measuring 14 mm, increased from 5 mm.  No new lung mass or nodularity.  New adenopathy in the upper abdomen.  2.7 cm lesion gastrohepatic ligament.  Necrotic node at the splenic hilum measures 4.7 cm.  Large node along the IVC left upper quadrant measures 2.7 cm.  Similar node left adjacent the aorta measures 1.8 cm.  Enhancing lesion in the spleen is unchanged.  Several low-density lesions in the liver are  unchanged. Pembrolizumab resumed on a 3-week schedule 12/03/2019 CT chest 02/22/2020-interval resolution of previously demonstrated left paratracheal and upper abdominal adenopathy.  Stable treated metastasis within the spleen.  Stable radiation changes in the left lung with left hilar distortion.  No evidence of local recurrence or progressive metastatic disease. Continuation of pembrolizumab every 3 weeks 02/26/2020 CT chest 06/28/2020-no change in left upper lobe consolidation, unchanged spleen metastasis, no evidence of progressive disease Pembrolizumab continued every 3 weeks 07/01/2020 CT chest 11/02/2020-stable posttreatment changes at the left hilum and left upper lobe, decreased size of spleen lesion, no evidence of disease progression Pembrolizumab every 3 weeks continued CT chest 01/12/2021-negative for acute pulmonary embolus.  Stable radiation fibrosis left upper lobe.  Stable low-density lesion in the spleen.  No new or progressive findings. 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  Splenic lesion stable.  No upper abdominal lymphadenopathy. Pembrolizumab every 3 weeks continued     2.  05/29/2019 laryngoscopy-exophytic appearing mass mid right vocal cord.   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  Tumor cells negative for p16, CMV and HSV 2.  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 Radiation 07/29/2019-08/25/2019   3. History of acute respiratory failure secondary to #1   4. Oxygen dependent COPD   5. History of a NSTEMI 2015   6. Right lung pneumonia on chest CT 12/12/2016-treated with Levaquin   7.  Grade 2 rash possibly related to Pembrolizumab.  Treatment held 08/06/2017.  Improved 08/29/2017,  treatment resumed, rash has resolved   8.  Vertigo January 2022-improved with course of Augmentin for ear/sinus infection      Disposition: Brooke [name] continues every 3-week  pembrolizumab.  She has increased malaise over the past month.  This may be related to COPD.  She has discomfort in the left abdomen of unclear etiology.  She has been referred to orthopedics.  I doubt the tenderness in the lateral left abdomen is related to the T8 compression fracture.  I suspect the compression fracture is benign.  She will call for increased pain.  She will complete another treat with pembrolizumab today.  She will return for an office visit in 3 weeks.   Betsy Coder, MD  06/30/2021  9:52 AM

## 2021-06-30 NOTE — Patient Instructions (Signed)
[facility]  Discharge Instructions: Thank you for choosing [facility] to provide your oncology and hematology care.   If you have a lab appointment with the [facility], please go directly to the [facility] and check in at the registration area.   Wear comfortable clothing and clothing appropriate for easy access to any Portacath or PICC line.   We strive to give you quality time with your provider. You may need to reschedule your appointment if you arrive late (15 or more minutes).  Arriving late affects you and other patients whose appointments are after yours.  Also, if you miss three or more appointments without notifying the office, you may be dismissed from the clinic at the providers discretion.      For prescription refill requests, have your pharmacy contact our office and allow 72 hours for refills to be completed.    Today you received the following chemotherapy and/or immunotherapy agents Keytruda      To help prevent nausea and vomiting after your treatment, we encourage you to take your nausea medication as directed.  BELOW ARE SYMPTOMS THAT SHOULD BE REPORTED IMMEDIATELY: *FEVER GREATER THAN 100.4 F (38 C) OR HIGHER *CHILLS OR SWEATING *NAUSEA AND VOMITING THAT IS NOT CONTROLLED WITH YOUR NAUSEA MEDICATION *UNUSUAL SHORTNESS OF BREATH *UNUSUAL BRUISING OR BLEEDING *URINARY PROBLEMS (pain or burning when urinating, or frequent urination) *BOWEL PROBLEMS (unusual diarrhea, constipation, pain near the anus) TENDERNESS IN MOUTH AND THROAT WITH OR WITHOUT PRESENCE OF ULCERS (sore throat, sores in mouth, or a toothache) UNUSUAL RASH, SWELLING OR PAIN  UNUSUAL VAGINAL DISCHARGE OR ITCHING   Items with * indicate a potential emergency and should be followed up as soon as possible or go to the Emergency Department if any problems should occur.  Please show the CHEMOTHERAPY ALERT CARD or IMMUNOTHERAPY ALERT CARD at check-in to the  Emergency Department and triage nurse.  Should you have questions after your visit or need to cancel or reschedule your appointment, please contact [facility]  Dept: [phone]  and follow the prompts.  Office hours are 8:00 a.m. to 4:30 p.m. Monday - Friday. Please note that voicemails left after 4:00 p.m. may not be returned until the following business day.  We are closed weekends and major holidays. You have access to a nurse at all times for urgent questions. Please call the main number to the clinic Dept: [phone] and follow the prompts.   For any non-urgent questions, you may also contact your provider using MyChart. We now offer e-Visits for anyone 41 and older to request care online for non-urgent symptoms. For details visit [email or web address].   Also download the MyChart app! Go to the app store, search "MyChart", open the app, select [facility], and log in with your MyChart username and password.  Due to Covid, a mask is required upon entering the hospital/clinic. If you do not have a mask, one will be given to you upon arrival. For doctor visits, patients may have 1 support person aged 22 or older with them. For treatment visits, patients cannot have anyone with them due to current Covid guidelines and our immunocompromised population.

## 2021-06-30 NOTE — Progress Notes (Signed)
Patient presents for treatment. RN assessment completed along with the following:  Labs/vitals reviewed - Yes, and within treatment parameters.   Weight within 10% of previous measurement - Yes Informed consent completed and reflects current therapy/intent - Yes, on date 12/14/2016             Provider progress note reviewed - Yes, today's provider note was reviewed. Treatment/Antibody/Supportive plan reviewed - Yes, and there are no adjustments needed for today's treatment. S&H and other orders reviewed - Yes, and there are no additional orders identified. Previous treatment date reviewed - Yes, and the appropriate amount of time has elapsed between treatments. Clinic Hand Off Received from - Cristy Friedlander, RN  Patient to proceed with treatment.

## 2021-07-12 ENCOUNTER — Ambulatory Visit (INDEPENDENT_AMBULATORY_CARE_PROVIDER_SITE_OTHER): Payer: Medicare Other | Admitting: Psychologist

## 2021-07-12 DIAGNOSIS — F411 Generalized anxiety disorder: Secondary | ICD-10-CM | POA: Diagnosis not present

## 2021-07-12 NOTE — Progress Notes (Signed)
[facility] Counselor/Therapist Progress Note ? ?Patient ID: Brooke [name], MRN: [id number],   ? ?Date: 07/12/2021 ? ?Time Spent: 1:05 pm to 1:25 pm; total time: 20 minutes ? ? This session was held via phone teletherapy due to the coronavirus risk at this time. The patient consented to phone teletherapy and was located at her home during this session. She is aware it is the responsibility of the patient to secure confidentiality on her end of the session. The provider was in a private home office for the duration of this session. Limits of confidentiality were discussed with the patient.  ? ?Treatment Type: Individual Therapy ? ?Reported Symptoms: Less anxiety  ? ?Mental Status Exam: ?Appearance:  NA     ?Behavior: Appropriate  ?Motor: NA  ?Speech/Language:  Clear and Coherent  ?Affect: Appropriate  ?Mood: normal  ?Thought process: normal  ?Thought content:   WNL  ?Sensory/Perceptual disturbances:   WNL  ?Orientation: oriented to person, place, and time/date  ?Attention: Good  ?Concentration: Good  ?Memory: WNL  ?Fund of knowledge:  Fair  ?Insight:   Fair  ?Judgment:  Good  ?Impulse Control: Good  ? ?Risk Assessment: ?Danger to Self:  No ?Self-injurious Behavior: No ?Danger to Others: No ?Duty to Warn:no ?Physical Aggression / Violence:No  ?Access to Firearms a concern: No  ?Gang Involvement:No  ? ?Subjective: The patient described herself as okay despite experiencing a headache. She then voiced appreciation related to receiving information about a massage therapist. From there, she voiced that she feels like her health is declining, which has resulted in her thinking about death. Patient then stated that talking was too difficult and asked to end the session. She denied suicidal and homicidal ideation.   ? ?Interventions:  Worked on developing a therapeutic relationship with the patient using active listening and reflective statements. Provided emotional support using empathy and validation.   Reflected on events since the last session. Praised patient for doing a little better. Provided information that patient asked for. Normalized and validated expressed thoughts and emotions. Attempted to explore the concerns that patient expressed. Normalized and validated expressed thoughts and emotions. Processed expressed emotions. Provided empathic statements. Assessed for suicidal and homicidal ideation.  ? ?Homework: NA ? ?Next Session: NA ? ?Diagnosis: F41.1 generalized anxiety disorder ? ?Plan:  ? ?Client Abilities: Friendly and easy to develop rapport ? ?Client Preferences: ACT ? ?Client statement of Needs: Coping strategies and emotional support ? ?Treatment Level: Outpatient ? ?Goals ?Work through the grieving process and face reality of own death ?Accept emotional support from others around them ?Live life to the fullest, event though time may be limited ?Become as knowledgeable about the medical condition  ?Reduce fear, anxiety about the health condition  ?Accept the illness ?Accept the role of psychological and behavioral factors  ?Stabilize anxiety level wile increasing ability to function ?Learn and implement coping skills that result in a reduction of anxiety  ?Alleviate depressive symptoms ?Recognize, accept, and cope with depressive feelings ?Develop healthy thinking patterns ?Develop healthy interpersonal relationships ? ?Objectives target date for all objectives is 04/21/2022 ?Identify feelings associated with the illness ?Family members share with each other feelings ?Identify the losses or limitations that have been experienced ?Verbalize acceptance of the reality of the medical condition ?Commit to learning and implement a proactive approach to managing personal stresses ?Verbalize an understanding of the medical condition ?Work with therapist to develop a plan for coping with stress ?Learn and implement skills for managing stress ?Engage in social, productive  activities that are  possible ?Engage in faith based activities ?implement positive imagery ?Identify coping skills and sources of emotional support ?Patient's partner and family members verbalize their fears regarding severity of health condition ?Identify sources of emotional distress  ?Learning and implement calming skills to reduce overall anxiety ?Learn and implement problem solving strategies ?Identify and engage in pleasant activities ?Learning and implement personal and interpersonal skills to reduce anxiety and improve interpersonal relationships ?Learn to accept limitations in life and commit to tolerating, rather than avoiding, unpleasant emotions while accomplishing meaningful goals ?Identify major life conflicts from the past and present that form the basis for present anxiety ?Learn and implement behavioral strategies ?Verbalize an understanding and resolution of current interpersonal problems ?Learn and implement problem solving and decision making skills ?Learn and implement conflict resolution skills to resolve interpersonal problems ?Verbalize an understanding of healthy and unhealthy emotions verbalize insight into how past relationships may be influence current experiences with depression ?Use mindfulness and acceptance strategies and increase value based behavior  ?Increase hopeful statements about the future.  ? ?Interventions ?Teach about stress and ways to handle stress ?Assist the patient in developing a coping action plan for stressors ?Conduct skills based training for coping strategies ?Train problem focused skills ?Sort out what activities the individual can do ?Encourage patient to rely upon his/her spiritual faith ?Teach the patient to use guided imagery ?Probe and evaluate family's ability to provide emotional support ?Allow family to share their fears ?Assist the patient in identifying, sorting through, and verbalizing the various feelings generated by his/her medical condition ?Meet with family members   ?Ask patient list out limitations  ?Use stress inoculation training  ?Use Acceptance and Commitment Therapy to help client accept uncomfortable realities in order to accomplish value-consistent goals ?Reinforce the client's insight into the role of his/her past emotional pain and present anxiety  ?Discuss examples demonstrating that unrealistic worry overestimates the probability of threats and underestimate patient's ability  ?Assist the patient in analyzing his or her worries ?Help patient understand that avoidance is reinforcing  ?Behavioral activation help the client explore the relationship, nature of the dispute,  ?Help the client develop new interpersonal skills and relationships ?Conduct Problem so living therapy ?Teach conflict resolution skills ?Use a process-experiential approach ?Conduct TLDP ?Conduct ACT ? ?The patient and clinician reviewed the treatment plan on 05/26/2021. The patient approved of the treatment plan.  ? ?Conception Chancy, PsyD ? ? ?

## 2021-07-16 ENCOUNTER — Other Ambulatory Visit: Payer: Self-pay | Admitting: Oncology

## 2021-07-16 ENCOUNTER — Other Ambulatory Visit: Payer: Self-pay | Admitting: Cardiovascular Disease

## 2021-07-21 ENCOUNTER — Other Ambulatory Visit: Payer: Self-pay

## 2021-07-21 ENCOUNTER — Inpatient Hospital Stay: Payer: Medicare Other

## 2021-07-21 ENCOUNTER — Inpatient Hospital Stay (HOSPITAL_BASED_OUTPATIENT_CLINIC_OR_DEPARTMENT_OTHER): Payer: Medicare Other | Admitting: Oncology

## 2021-07-21 ENCOUNTER — Inpatient Hospital Stay: Payer: Medicare Other | Attending: Oncology

## 2021-07-21 ENCOUNTER — Encounter: Payer: Self-pay | Admitting: *Deleted

## 2021-07-21 VITALS — BP 121/75 | HR 83 | Resp 18

## 2021-07-21 VITALS — BP 146/83 | HR 85 | Temp 98.2°F | Resp 20 | Ht 62.0 in | Wt 90.0 lb

## 2021-07-21 DIAGNOSIS — C3412 Malignant neoplasm of upper lobe, left bronchus or lung: Secondary | ICD-10-CM | POA: Diagnosis not present

## 2021-07-21 DIAGNOSIS — I2699 Other pulmonary embolism without acute cor pulmonale: Secondary | ICD-10-CM | POA: Insufficient documentation

## 2021-07-21 DIAGNOSIS — Z79899 Other long term (current) drug therapy: Secondary | ICD-10-CM | POA: Insufficient documentation

## 2021-07-21 DIAGNOSIS — J440 Chronic obstructive pulmonary disease with acute lower respiratory infection: Secondary | ICD-10-CM | POA: Diagnosis not present

## 2021-07-21 DIAGNOSIS — I252 Old myocardial infarction: Secondary | ICD-10-CM | POA: Insufficient documentation

## 2021-07-21 DIAGNOSIS — Z5112 Encounter for antineoplastic immunotherapy: Secondary | ICD-10-CM | POA: Diagnosis present

## 2021-07-21 DIAGNOSIS — C7889 Secondary malignant neoplasm of other digestive organs: Secondary | ICD-10-CM | POA: Diagnosis not present

## 2021-07-21 DIAGNOSIS — Z86711 Personal history of pulmonary embolism: Secondary | ICD-10-CM | POA: Insufficient documentation

## 2021-07-21 DIAGNOSIS — Z9981 Dependence on supplemental oxygen: Secondary | ICD-10-CM | POA: Diagnosis not present

## 2021-07-21 LAB — CMP (CANCER CENTER ONLY)
ALT: 16 U/L (ref 0–44)
AST: 17 U/L (ref 15–41)
Albumin: 4.2 g/dL (ref 3.5–5.0)
Alkaline Phosphatase: 54 U/L (ref 38–126)
Anion gap: 6 (ref 5–15)
BUN: 13 mg/dL (ref 8–23)
CO2: 30 mmol/L (ref 22–32)
Calcium: 9.7 mg/dL (ref 8.9–10.3)
Chloride: 101 mmol/L (ref 98–111)
Creatinine: 0.43 mg/dL — ABNORMAL LOW (ref 0.44–1.00)
GFR, Estimated: >60 mL/min (ref >60)
Glucose, Bld: 103 mg/dL — ABNORMAL HIGH (ref 70–99)
Potassium: 3.7 mmol/L (ref 3.5–5.1)
Sodium: 137 mmol/L (ref 135–145)
Total Bilirubin: 0.5 mg/dL (ref 0.3–1.2)
Total Protein: 7 g/dL (ref 6.5–8.1)

## 2021-07-21 LAB — TSH: TSH: 1.272 u[IU]/mL (ref 0.350–4.500)

## 2021-07-21 MED ORDER — SODIUM CHLORIDE 0.9 % IV SOLN
200.0000 mg | Freq: Once | INTRAVENOUS | Status: AC
  Administered 2021-07-21: 200 mg via INTRAVENOUS
  Filled 2021-07-21: qty 8

## 2021-07-21 MED ORDER — SODIUM CHLORIDE 0.9 % IV SOLN: Freq: Once | INTRAVENOUS | Status: AC

## 2021-07-21 NOTE — Progress Notes (Signed)
?[facility] ?OFFICE PROGRESS NOTE ? ? ?Diagnosis: Non-small cell lung cancer ? ?INTERVAL HISTORY:  ? ?Brooke [name] completed another treatment with pembrolizumab on 06/30/2021.  No rash or diarrhea.  She reports not feeling well.  She has decreased energy.  Her lack of energy makes it difficult to prepare food.  She has a good appetite.  She reports pain at the left posterior lateral and lower anterior chest wall for the past several days.  The pain is worse with coughing or certain movements.  No trauma. ? ?Objective: ? ?Vital signs in last 24 hours: ? ?Blood pressure (!) 146/83, pulse 85, temperature 98.2 ?F (36.8 ?C), temperature source Oral, resp. rate 20, height '5\' 2"'  (1.575 m), weight 90 lb (40.8 kg), SpO2 100 %. ?  ? ? ? ?Resp: End expiratory wheeze at the left posterior chest, no respiratory distress ?Cardio: Regular rate and rhythm ?GI: No hepatosplenomegaly ?Vascular: No leg edema ?Musculoskeletal: Tender at the left lower posterior and anterior chest wall.  No mass or rash. ? ? ?Lab Results: ? ?Lab Results  ?Component Value Date  ? WBC 4.4 06/30/2021  ? HGB 13.5 06/30/2021  ? HCT 42.1 06/30/2021  ? MCV 90.1 06/30/2021  ? PLT 230 06/30/2021  ? NEUTROABS 3.1 06/30/2021  ? ? ?CMP  ?Lab Results  ?Component Value Date  ? NA 137 07/21/2021  ? K 3.7 07/21/2021  ? CL 101 07/21/2021  ? CO2 30 07/21/2021  ? GLUCOSE 103 (H) 07/21/2021  ? BUN 13 07/21/2021  ? CREATININE 0.43 (L) 07/21/2021  ? CALCIUM 9.7 07/21/2021  ? PROT 7.0 07/21/2021  ? ALBUMIN 4.2 07/21/2021  ? AST 17 07/21/2021  ? ALT 16 07/21/2021  ? ALKPHOS 54 07/21/2021  ? BILITOT 0.5 07/21/2021  ? GFRNONAA >60 07/21/2021  ? GFRAA >60 02/05/2020  ? ? ? ?Medications: I have reviewed the patient's current medications. ? ? 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 ?PDL1 60% ?Left lung radiation 11/08/2016 through 11/21/2016 ?CT chest 12/12/2016-reexpansion of left upper lobe with a decreased left upper lobe mass, unchanged mediastinal adenopathy, progression of a splenic metastasis/pancreatic tail/gastrohepatic ligament metastasis, right lower lobe pneumonia ?Cycle 1 Pembrolizumab 12/14/2016 ?Cycle 2 Pembrolizumab 01/03/2017 ?Cycle 3 Pembrolizumab 01/24/2017 ?Cycle 4 Pembrolizumab 02/12/2017 ?CT 03/05/2018-decrease in left upper lobe mass, mediastinal adenopathy, and splenic mass ?Cycle 5 pembrolizumab 03/07/2017 ?Cycle 6 Pembrolizumab 04/02/2017 ?Cycle 7 pembrolizumab 04/25/2017 ?Cycle 8 Pembrolizumab 05/14/2017 ?Cycle 9 Pembrolizumab 06/06/2017 ?CT 06/20/2017-mild decrease in left upper lobe mass, mild increase in paramediastinal left upper lobe nodule-radiation change?, decreased splenic metastasis ?Cycle 10 pembrolizumab 06/25/2017 ?Cycle 11 pembrolizumab 07/18/2017 ?Cycle 12 pembrolizumab 08/29/2017 ?Cycle 13 pembrolizumab 09/17/2017 ?Cycle 14 Pembrolizumab 10/10/2017 ?CT chest 10/24/2017- slight enlargement of small mediastinal lymph nodes, increased left upper lobe consolidation, decreased size of spleen lesion ?Cycle 15 pembrolizumab 10/29/2017 ?Cycle 16 Pembrolizumab 11/21/2017 ?Cycle 17 Pembrolizumab 12/11/2017 ?Cycle 18 pembrolizumab 01/02/2018 ?Cycle 19 Pembrolizumab 01/21/2018 ?Cycle 20 Pembrolizumab 02/13/2018 ?CT chest 02/27/2018- decreased left paratracheal node, progressive consolidation/fibrosis in the left upper lung, decreased size of splenic mass ?Cycle 21 pembrolizumab 03/04/2018 ?Cycle 22 Pembrolizumab 03/27/2018  ?Cycle 23 pembrolizumab 04/15/2018 ?Cycle 24 pembrolizumab 05/08/2018 ?Cycle 25 Pembrolizumab 05/27/2018 ?CT chest 06/16/2018-  stable left upper lung radiation fibrosis with no evidence of local tumor recurrence.  Decreased left paratracheal adenopathy.  No new or progressive metastatic disease in the chest.  Gastrohepatic ligament lymphadenopathy stable.  Splenic metastasis decreased.  T5 vertebral compression fracture with associated patchy sclerosis and no discrete osseous lesion, new in the interval. ?Cycle 26 Pembrolizumab 06/19/2018 ?Cycle 27 pembrolizumab 07/08/2018 ?Cycle 28 pembrolizumab 07/31/2018 ?Cycle 29 pembrolizumab 08/19/2018 ?Cycle 30 Pembrolizumab 09/11/2018 ?Cycle 31 pembrolizumab 09/30/2018 ?Cycle 32 pembrolizumab 10/23/2018 ?CT chest 10/28/2018- left apical pleural-parenchymal opacity consistent with radiation scarring has become more confluent likely representing evolutionary change.  Continued further decrease in size of index left paratracheal node and splenic metastasis.  Gastrohepatic ligament lymph node not changed. ?Cycle 33 Pembrolizumab 11/11/2018 ?Cycle 34 pembrolizumab 12/04/2018 ?Cycle 35 pembrolizumab 12/23/2018 ?CT chest 02/12/2019- posttreatment changes left upper lobe unchanged.  Continued decrease in size of splenic metastasis.  Stable appearance of left paratracheal lymph nodes.  Stable gastrohepatic adenopathy.  Incomplete visualization of retroperitoneal lymph nodes in the upper abdomen. ?CT chest 07/02/2019-stable post treatment related changes left lung.  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  Slight regression of enlarged likely metastatic gastrohepatic lymph node. ?CT chest 11/11/2019-interval enlargement of a right paratracheal lymph node measuring 14 mm, increased from 5 mm.  No new lung mass or nodularity.  New adenopathy in the upper abdomen.  2.7 cm lesion gastrohepatic ligament.  Necrotic node at the splenic hilum measures 4.7 cm.  Large node along the IVC left upper quadrant measures 2.7 cm.  Similar node left adjacent the aorta measures 1.8 cm.  Enhancing lesion in the spleen is unchanged.  Several  low-density lesions in the liver are unchanged. ?Pembrolizumab resumed on a 3-week schedule 12/03/2019 ?CT chest 02/22/2020-interval resolution of previously demonstrated left paratracheal and upper abdominal adenopathy.  Stable treated metastasis within the spleen.  Stable radiation changes in the left lung with left hilar distortion.  No evidence of local recurrence or progressive metastatic disease. ?Continuation of pembrolizumab every 3 weeks 02/26/2020 ?CT chest 06/28/2020-no change in left upper lobe consolidation, unchanged spleen metastasis, no evidence of progressive disease ?Pembrolizumab continued every 3 weeks 07/01/2020 ?CT chest 11/02/2020-stable posttreatment changes at the left hilum and left upper lobe, decreased size of spleen lesion, no evidence of disease progression ?Pembrolizumab every 3 weeks continued ?CT chest 01/12/2021-negative for acute pulmonary embolus.  Stable radiation fibrosis left upper lobe.  Stable low-density lesion in the spleen.  No new or progressive findings. 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  Splenic lesion stable.  No upper abdominal lymphadenopathy. ?Pembrolizumab every 3 weeks continued ?  ?  ?2.  05/29/2019 laryngoscopy-exophytic appearing mass mid right vocal cord.   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  Tumor cells negative for p16, CMV and HSV 2.  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 ?Radiation 07/29/2019-08/25/2019 ?  ?3. History of acute respiratory failure secondary to #1 ?  ?4. Oxygen dependent COPD ?  ?5. History of a NSTEMI 2015 ?  ?6. Right lung pneumonia on chest CT 12/12/2016-treated with Levaquin ?  ?7.  Grade 2 rash possibly related to Pembrolizumab.  Treatment held  08/06/2017.  Improved 08/29/2017, treatment resumed, rash has resolved ?  ?8.  Vertigo January 2022-improved with course of Augmentin for ear/sinus infection ?  ? ? ? ? ?  Disposition: ?Brooke [name] has a histo

## 2021-07-21 NOTE — Progress Notes (Signed)
Patient seen by [name] today ? ?Vitals are within treatment parameters. ? ?Labs reviewed by [name] and are within treatment parameters. ?CBC not needed per [name]. ? ?Per physician team, patient is ready for treatment and there are NO modifications to the treatment plan.  ?

## 2021-07-21 NOTE — Progress Notes (Signed)
Patient presents for treatment. RN assessment completed along with the following: ? ?Labs/vitals reviewed - Yes, and within treatment parameters.   ?Weight within 10% of previous measurement - Yes ?Informed consent completed and reflects current therapy/intent - Yes, on date 12/14/2016             ?Provider progress note reviewed - Today's provider note is not yet available. I reviewed the most recent oncology provider progress note in chart dated 06/30/2021. ?Treatment/Antibody/Supportive plan reviewed - Yes, and there are no adjustments needed for today's treatment. ?S&H and other orders reviewed - Yes, and there are no additional orders identified. ?Previous treatment date reviewed - Yes, and the appropriate amount of time has elapsed between treatments. ?Clinic Hand Off Received from - Yes from [place], RN ? ?Patient to proceed with treatment.  ? ?

## 2021-07-21 NOTE — Patient Instructions (Signed)
[facility]   ?Discharge Instructions: ?Thank you for choosing [facility] to provide your oncology and hematology care.  ? ?If you have a lab appointment with the [facility], please go directly to the [facility] and check in at the registration area. ?  ?Wear comfortable clothing and clothing appropriate for easy access to any Portacath or PICC line.  ? ?We strive to give you quality time with your provider. You may need to reschedule your appointment if you arrive late (15 or more minutes).  Arriving late affects you and other patients whose appointments are after yours.  Also, if you miss three or more appointments without notifying the office, you may be dismissed from the clinic at the provider?s discretion.    ?  ?For prescription refill requests, have your pharmacy contact our office and allow 72 hours for refills to be completed.   ? ?Today you received the following chemotherapy and/or immunotherapy agents Pembrolizumab (KEYTRUDA).    ?  ?To help prevent nausea and vomiting after your treatment, we encourage you to take your nausea medication as directed. ? ?BELOW ARE SYMPTOMS THAT SHOULD BE REPORTED IMMEDIATELY: ?*FEVER GREATER THAN 100.4 F (38 ?C) OR HIGHER ?*CHILLS OR SWEATING ?*NAUSEA AND VOMITING THAT IS NOT CONTROLLED WITH YOUR NAUSEA MEDICATION ?*UNUSUAL SHORTNESS OF BREATH ?*UNUSUAL BRUISING OR BLEEDING ?*URINARY PROBLEMS (pain or burning when urinating, or frequent urination) ?*BOWEL PROBLEMS (unusual diarrhea, constipation, pain near the anus) ?TENDERNESS IN MOUTH AND THROAT WITH OR WITHOUT PRESENCE OF ULCERS (sore throat, sores in mouth, or a toothache) ?UNUSUAL RASH, SWELLING OR PAIN  ?UNUSUAL VAGINAL DISCHARGE OR ITCHING  ? ?Items with * indicate a potential emergency and should be followed up as soon as possible or go to the Emergency Department if any problems should occur. ? ?Please show the CHEMOTHERAPY ALERT CARD or IMMUNOTHERAPY ALERT CARD  at check-in to the Emergency Department and triage nurse. ? ?Should you have questions after your visit or need to cancel or reschedule your appointment, please contact [facility]  Dept: [phone]  and follow the prompts.  Office hours are 8:00 a.m. to 4:30 p.m. Monday - Friday. Please note that voicemails left after 4:00 p.m. may not be returned until the following business day.  We are closed weekends and major holidays. You have access to a nurse at all times for urgent questions. Please call the main number to the clinic Dept: [phone] and follow the prompts. ? ? ?For any non-urgent questions, you may also contact your provider using MyChart. We now offer e-Visits for anyone 38 and older to request care online for non-urgent symptoms. For details visit [email or web address]. ?  ?Also download the MyChart app! Go to the app store, search "MyChart", open the app, select [facility], and log in with your MyChart username and password. ? ?Due to Covid, a mask is required upon entering the hospital/clinic. If you do not have a mask, one will be given to you upon arrival. For doctor visits, patients may have 1 support person aged 76 or older with them. For treatment visits, patients cannot have anyone with them due to current Covid guidelines and our immunocompromised population.  ? ?Pembrolizumab injection ?What is this medication? ?PEMBROLIZUMAB (pem broe liz ue mab) is a monoclonal antibody. It is used to treat certain types of cancer. ?This medicine may be used for other purposes; ask your health care provider or pharmacist if you have questions. ?COMMON BRAND NAME(S):  Keytruda ?What should I tell my care team before I take this medication? ?They need to know if you have any of these conditions: ?autoimmune diseases like Crohn's disease, ulcerative colitis, or lupus ?have had or planning to have an allogeneic stem cell transplant (uses someone else's stem cells) ?history of  organ transplant ?history of chest radiation ?nervous system problems like myasthenia gravis or Guillain-Barre syndrome ?an unusual or allergic reaction to pembrolizumab, other medicines, foods, dyes, or preservatives ?pregnant or trying to get pregnant ?breast-feeding ?How should I use this medication? ?This medicine is for infusion into a vein. It is given by a health care professional in a hospital or clinic setting. ?A special MedGuide will be given to you before each treatment. Be sure to read this information carefully each time. ?Talk to your pediatrician regarding the use of this medicine in children. While this drug may be prescribed for children as young as 6 months for selected conditions, precautions do apply. ?Overdosage: If you think you have taken too much of this medicine contact a poison control center or emergency room at once. ?NOTE: This medicine is only for you. Do not share this medicine with others. ?What if I miss a dose? ?It is important not to miss your dose. Call your doctor or health care professional if you are unable to keep an appointment. ?What may interact with this medication? ?Interactions have not been studied. ?This list may not describe all possible interactions. Give your health care provider a list of all the medicines, herbs, non-prescription drugs, or dietary supplements you use. Also tell them if you smoke, drink alcohol, or use illegal drugs. Some items may interact with your medicine. ?What should I watch for while using this medication? ?Your condition will be monitored carefully while you are receiving this medicine. ?You may need blood work done while you are taking this medicine. ?Do not become pregnant while taking this medicine or for 4 months after stopping it. Women should inform their doctor if they wish to become pregnant or think they might be pregnant. There is a potential for serious side effects to an unborn child. Talk to your health care professional or  pharmacist for more information. Do not breast-feed an infant while taking this medicine or for 4 months after the last dose. ?What side effects may I notice from receiving this medication? ?Side effects that you should report to your doctor or health care professional as soon as possible: ?allergic reactions like skin rash, itching or hives, swelling of the face, lips, or tongue ?bloody or black, tarry ?breathing problems ?changes in vision ?chest pain ?chills ?confusion ?constipation ?cough ?diarrhea ?dizziness or feeling faint or lightheaded ?fast or irregular heartbeat ?fever ?flushing ?joint pain ?low blood counts - this medicine may decrease the number of white blood cells, red blood cells and platelets. You may be at increased risk for infections and bleeding. ?muscle pain ?muscle weakness ?pain, tingling, numbness in the hands or feet ?persistent headache ?redness, blistering, peeling or loosening of the skin, including inside the mouth ?signs and symptoms of high blood sugar such as dizziness; dry mouth; dry skin; fruity breath; nausea; stomach pain; increased hunger or thirst; increased urination ?signs and symptoms of kidney injury like trouble passing urine or change in the amount of urine ?signs and symptoms of liver injury like dark urine, light-colored stools, loss of appetite, nausea, right upper belly pain, yellowing of the eyes or skin ?sweating ?swollen lymph nodes ?weight loss ?Side effects that usually do not  require medical attention (report to your doctor or health care professional if they continue or are bothersome): ?decreased appetite ?hair loss ?tiredness ?This list may not describe all possible side effects. Call your doctor for medical advice about side effects. You may report side effects to FDA at 1-800-FDA-1088. ?Where should I keep my medication? ?This drug is given in a hospital or clinic and will not be stored at home. ?NOTE: This sheet is a summary. It may not cover all possible  information. If you have questions about this medicine, talk to your doctor, pharmacist, or health care provider. ?? 2022 [employer] (2021-01-10 00:00:00) ? ?

## 2021-07-27 ENCOUNTER — Other Ambulatory Visit: Payer: Self-pay

## 2021-07-27 ENCOUNTER — Encounter: Payer: Self-pay | Admitting: Specialist

## 2021-07-27 ENCOUNTER — Ambulatory Visit (INDEPENDENT_AMBULATORY_CARE_PROVIDER_SITE_OTHER): Payer: Medicare Other | Admitting: Specialist

## 2021-07-27 VITALS — BP 158/90 | HR 80 | Ht 62.0 in | Wt 90.0 lb

## 2021-07-27 DIAGNOSIS — S22060G Wedge compression fracture of T7-T8 vertebra, subsequent encounter for fracture with delayed healing: Secondary | ICD-10-CM | POA: Diagnosis not present

## 2021-07-27 DIAGNOSIS — E559 Vitamin D deficiency, unspecified: Secondary | ICD-10-CM

## 2021-07-27 DIAGNOSIS — M8008XA Age-related osteoporosis with current pathological fracture, vertebra(e), initial encounter for fracture: Secondary | ICD-10-CM | POA: Diagnosis not present

## 2021-07-27 DIAGNOSIS — M8000XA Age-related osteoporosis with current pathological fracture, unspecified site, initial encounter for fracture: Secondary | ICD-10-CM

## 2021-07-27 MED ORDER — GABAPENTIN 100 MG PO CAPS: ORAL_CAPSULE | ORAL | 0 refills | Status: DC

## 2021-07-27 MED ORDER — VITAMIN D (ERGOCALCIFEROL) 1.25 MG (50000 UNIT) PO CAPS: 50000.0000 [IU] | ORAL_CAPSULE | ORAL | 0 refills | Status: DC

## 2021-07-27 MED ORDER — CALCIUM CITRATE 950 (200 CA) MG PO TABS: 200.0000 mg | ORAL_TABLET | Freq: Every day | ORAL | 3 refills | Status: DC

## 2021-07-27 NOTE — Patient Instructions (Addendum)
Avoid frequent bending and stooping  ?No lifting greater than 5-10 lbs. ?May use ice or moist heat for pain. ?Weight loss is of benefit. ?No brace or corset due to affect on breathing ?Gabapentin for nerve pain ?Consider referral for osteoporosis treatment to prevent further osteoporosis related fractures.  ?Vitamin D level ?Exercise is important to improve your indurance and does allow people to function better inspite of back pain. ?Physical therapy to work on upper back posture and extension exercises, TENS trial.  ?

## 2021-07-27 NOTE — Progress Notes (Addendum)
? ?Office Visit Note ?  ?Patient: Brooke [name]           ?Date of Birth: [birth year]           ?MRN: [id number] ?Visit Date: 07/27/2021 ?             ?Requested by: Tanda Rockers, MD ?[address] ?[address] ?[place],   [address] ?PCP: [name], MD ? ? ?Assessment & Plan: ?Visit Diagnoses:  ?1. Closed wedge compression fracture of T8 vertebra with delayed healing   ?2. Age-related osteoporosis with current pathological fracture, initial encounter   ? ? ?Plan:Avoid frequent bending and stooping  ?No lifting greater than 5-10 lbs. ?May use ice or moist heat for pain. ?Weight loss is of benefit. ?No brace or corset due to affect on breathing ?Gabapentin for nerve pain ?Consider referral for osteoporosis treatment to prevent further osteoporosis related fractures.  ?Vitamin D level ?Exercise is important to improve your indurance and does allow people to function better inspite of back pain. ?Physical therapy to work on upper back posture and extension exercises, TENS trial.  ? ?Follow-Up Instructions: No follow-ups on file.  ? ?Orders:  ?No orders of the defined types were placed in this encounter. ? ?No orders of the defined types were placed in this encounter. ? ? ? ? Procedures: ?No procedures performed ? ? ?Clinical Data: ?Findings:  ?Narrative & Impression ?CLINICAL DATA:  76 year old female with history of lung cancer ?diagnosed 4 years ago. Restaging examination. ?  ?EXAM: ?CT CHEST WITH CONTRAST ?  ?TECHNIQUE: ?Multidetector CT imaging of the chest was performed during ?intravenous contrast administration. ?  ?RADIATION DOSE REDUCTION: This exam was performed according to the ?departmental dose-optimization program which includes automated ?exposure control, adjustment of the mA and/or kV according to ?patient size and/or use of iterative reconstruction technique. ?  ?CONTRAST:  78mL OMNIPAQUE IOHEXOL 300 MG/ML  SOLN ?  ?COMPARISON:  Chest CT 01/12/2021. ?  ?FINDINGS: ?Cardiovascular: Heart  size is normal. Small amount of pericardial ?fluid and/or thickening. No definite pericardial calcifications. ?There is aortic atherosclerosis, as well as atherosclerosis of the ?great vessels of the mediastinum and the coronary arteries, ?including calcified atherosclerotic plaque in the left main, left ?anterior descending, left circumflex and right coronary arteries. ?  ?Mediastinum/Nodes: No pathologically enlarged mediastinal or hilar ?lymph nodes. Esophagus is unremarkable in appearance. ?  ?Lungs/Pleura: Chronic volume loss and architectural distortion in ?the apex of the left upper lobe, and to a lesser extent in the ?superior segment of the left lower lobe, compatible with chronic ?postradiation mass-like fibrosis. No definite new suspicious ?appearing pulmonary nodules or masses are noted. No acute ?consolidative airspace disease. No pleural effusions. Diffuse ?bronchial wall thickening with moderate centrilobular and paraseptal ?emphysema. ?  ?Upper Abdomen: Aortic atherosclerosis. Low-attenuation lesions ?scattered throughout the liver, compatible with cysts, largest of ?which is in segment 3 of the liver measuring 3.2 x 2.0 cm. ?  ?Musculoskeletal: Chronic appearing compression fracture of T6 with ?40% loss of anterior vertebral body height is unchanged. New ?compression fracture of superior endplate of T8 with 30% loss of ?anterior vertebral body height, but with no paravertebral soft ?tissue swelling, presumably late subacute or early chronic. There ?are no aggressive appearing lytic or blastic lesions noted in the ?visualized portions of the skeleton. ?  ?IMPRESSION: ?1. 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 No findings to suggest local ?recurrence of disease or definite metastatic disease in the  thorax. ?2. Diffuse bronchial wall thickening with moderate centrilobular and ?paraseptal emphysema; imaging findings suggestive of  underlying ?COPD. ?3. Aortic atherosclerosis, in addition to left main and three-vessel ?coronary artery disease. Assessment for potential risk factor ?modification, dietary therapy or pharmacologic therapy may be ?warranted, if clinically indicated. ?4. Compression fractures of T6 and T8, new (but nonacute) at T8, as ?discussed above. ?5. Additional incidental findings, as above. ?  ?Aortic Atherosclerosis (ICD10-I70.0) and Emphysema (ICD10-J43.9). ?  ?Electronically Signed: ?By: Vinnie Langton M.D. ?On: 05/17/2021 11:18 ? ? ?EXAM: ?DUAL X-RAY ABSORPTIOMETRY (DXA) FOR BONE MINERAL DENSITY ?  ?IMPRESSION: ?Referring Physician:  [name] ?Your patient completed a bone mineral density test using GE Lunar ?iDXA system (analysis version: 16). ?Technologist: ALW ?PATIENT: ?Name: Brooke, [name] ?Patient ID: [id number] Birth Date: 04/28/1946 Height: 62.5 in. ?Sex: Female Measured: 06/22/2021 Weight: 89.8 lbs. ?Indications: Advanced Age, Caucasian, Chemo, Depo Medrol, Estrogen ?Deficiency, History of tobacco use, Post Menopausal Fractures: ?Vertebra Treatments: Vitamin D ?  ?ASSESSMENT: ?The BMD measured at DualFemur Total Right is 0.547 g/cm2 with a ?T-score of -3.7. This patient is considered osteoporotic according ?to [facility]) criteria. ?  ?The scan quality is good. ?  ?Site Region Measured Date Measured Age YA BMD Significant CHANGE ?T-score ?DualFemur Total Right 06/22/2021    75.7         -3.7    0.547 g/cm2 ?  ?AP Spine  L1-L4       06/22/2021    75.7         -3.2    0.802 g/cm2 ?  ?DualFemur Total Mean  06/22/2021    75.7         -3.5    0.572 g/cm2 ?  ?World Health Organization [facility]) criteria for post-menopausal, ?Caucasian Women: ?Normal       T-score at or above -1 SD ?Osteopenia   T-score between -1 and -2.5 SD ?Osteoporosis T-score at or below -2.5 SD ?  ?RECOMMENDATION: ?1. All patients should optimize calcium and vitamin D intake. ?2. Consider FDA approved medical therapies in  postmenopausal women ?and men aged 57 years and older, based on the following: ?a. A hip or vertebral (clinical or morphometric) fracture ?b. T-score = -2.5 at the femoral neck or spine after appropriate ?evaluation to exclude secondary causes ?c. Low bone mass (T-score between -1.0 and -2.5 at the femoral neck ?or spine) and a 10- year probability of a hip fracture = 3% or a 10 ?year probability of a major osteoporosis-related fracture = 20% ?based on the [place]-adapted WHO algorithm. ?3. Clinician judgement and/or patient preference may indicate ?treatment for people with10-year fracture probabilities above or ?below these levels. ?  ?FOLLOW-UP: ?Patients with diagnosis of osteoporosis or at high risk for fracture ?should have regular bone mineral density tests. For patients ?eligible for Medicare routine testing is allowed once every 2 years. ?The testing frequency can be increased to one year for patients who ?have rapidly progressing disease, those who are receiving or ?discontinuing medical therapy to restore bone mass, or have ?additional risk factors. ?  ?I have reviewed this study and agree with the findings. ?  ?Mark A. Thornton Papas, M.D. ?[facility] ?  ?  ?Electronically Signed ?  By: Lavonia Dana M.D. ?  On: 06/22/2021 12:43 ?  ? ? ?Subjective: ?Chief Complaint  ?Patient presents with  ? Chest - Pain  ?  Rib cage, musculoskeletal pain  ? ? ?76 year old female with history of lung  ca and throat ca. She reports history of radiation treatment. She has seen Dr. Melvyn Novas with pulmonary and [name] with oncology. She reports having had imaging done in January. She is experiencing left mid clavicular line  ?Pain just inferior to the left inferior scapular angle involving the rib there and chest wall. Sitting at table and with back supported relieves the pain. She has taken advil for pain and riding in car is not painfule but sitting awaiting exam is painful. She has pain at night ?And no pain with  inspiration. Coughing up secretions sometimes has her feeling better. Reports weight loss to were she is below 90 lbs and normally is in the 105. ? ?Review of Systems  ?Constitutional:  Positive for unexpected weight c

## 2021-07-28 ENCOUNTER — Telehealth: Payer: Self-pay | Admitting: Internal Medicine

## 2021-07-28 LAB — VITAMIN D 25 HYDROXY (VIT D DEFICIENCY, FRACTURES): Vit D, 25-Hydroxy: 45 ng/mL (ref 30–100)

## 2021-07-28 MED ORDER — METHYLPREDNISOLONE 4 MG PO TABS: ORAL_TABLET | ORAL | 0 refills | Status: AC

## 2021-07-28 MED ORDER — AZITHROMYCIN 250 MG PO TABS: ORAL_TABLET | ORAL | 0 refills | Status: DC

## 2021-07-28 NOTE — Telephone Encounter (Signed)
Remind here neb can but used up to eery 4 hours if needed ? ?Zpak ?Medrol 4 mg mg take  4 each am x 2 days,   2 each am x 2 days,  1 each am x 2 days   ?

## 2021-07-28 NOTE — Telephone Encounter (Signed)
Called and spoke with patient. She verbalized understanding. Prescriptions sent in to the pharmacy. Nothing further needed. ?

## 2021-07-28 NOTE — Telephone Encounter (Signed)
Called and spoke with patient. She stated that she has been having more SOB episodes for the past few weeks. It has gotten to the point where it is difficult to move around her house. She is still using 2L of O2 24/7. She always has a productive cough with white phlegm but the phlegm has changed to a yellowish, greenish color. She has been wheezing more as well. Denied any fevers.  ? ?On average, she is using her albuterol inhaler twice daily which is an increase for her. She has been using the Duonebs twice daily since she has noticed a greater difference with these.  ? ?She is scheduled for an OV with MW on 08/01/21 at 2pm but wanted to know if MW could send in something before then.  ? ?Pharmacy is [facility] in [place].  ? ?MW, can you please advise? Thanks!  ?

## 2021-07-31 ENCOUNTER — Telehealth: Payer: Self-pay | Admitting: Specialist

## 2021-07-31 ENCOUNTER — Other Ambulatory Visit: Payer: Self-pay | Admitting: Specialist

## 2021-07-31 NOTE — Telephone Encounter (Signed)
Patient called needing results of her lab work Patient asked if she is suppose to take calcium  with vitamin D 2?  The number to contact patient is [phone] ?

## 2021-08-01 ENCOUNTER — Other Ambulatory Visit: Payer: Self-pay

## 2021-08-01 ENCOUNTER — Encounter: Payer: Self-pay | Admitting: Internal Medicine

## 2021-08-01 ENCOUNTER — Ambulatory Visit (INDEPENDENT_AMBULATORY_CARE_PROVIDER_SITE_OTHER): Payer: Medicare Other | Admitting: Internal Medicine

## 2021-08-01 DIAGNOSIS — J9611 Chronic respiratory failure with hypoxia: Secondary | ICD-10-CM

## 2021-08-01 DIAGNOSIS — R918 Other nonspecific abnormal finding of lung field: Secondary | ICD-10-CM | POA: Diagnosis not present

## 2021-08-01 DIAGNOSIS — J449 Chronic obstructive pulmonary disease, unspecified: Secondary | ICD-10-CM

## 2021-08-01 MED ORDER — METHYLPREDNISOLONE ACETATE 80 MG/ML IJ SUSP
120.0000 mg | Freq: Once | INTRAMUSCULAR | Status: AC
  Administered 2021-08-01: 120 mg via INTRAMUSCULAR

## 2021-08-01 NOTE — Telephone Encounter (Signed)
Dr. Louanne Skye called pt and gave her the results, advised to take 50K units then 2000units per day after. And calcium was already sent in ?

## 2021-08-01 NOTE — Progress Notes (Signed)
?Subjective:  ?  ?Patient ID: Brooke [name], female   DOB: January 24, 1946,    MRN: [id number] ? ?  ? ?Brief patient profile:  ?75 yowf quit smoking 2009 on 02 noct 2012 maint on symbicort 160 since aorund  2014 referred to pulmonary clinic 02/15/2016 by Dr   Kathryne Eriksson for RUL Lung mass dx with Stage IV nsc 11/01/16 in setting of GOLD IV copd  ? ? ? ? ?History of Present Illness  ?02/15/2016 1st [facility] office visit/ Brooke [name]  GOLD IV copd/ symb 160 2bid maint  ?Chief Complaint  ?Patient presents with  ? Pulmonary Consult  ?  referred by Dr. Kathryne Eriksson for COPD.  ?MMRC2 = can't walk a nl pace on a flat grade s sob but does fine slow and flat eg [employer] on 2lpm  ?New onset bloody mucus plugs November 11 2015 assoc with wt loss  ?02 2lpm hs and with walking / rare saba needed ?rec ?For cough > mucinex up to 1200 mg every 12 hours as needed ?Rec:  Fob but refused ? ? ?11/01/16   CT directed bx >>>  Pos NSC LUL   ? ?Diagnosis:   Stage IV NSCLC, Squamous cell carcinoma of the left upper lobe.    ?  ?Indication for treatment:  palliative      ?  ?Radiation treatment dates:   11/08/2016 to 11/21/2016 ?  ?Site/dose:   The Left lung was treated to 30 Gy in 10 fractions at 3 Gy per fraction.  ?  ?Beams/energy:   3D // 10X, 6X ? ? ? ?12/06/2016  f/u ov/Brooke [name] re: post hosp f/u -  Now on dulera 200 bid and duoneb ?Chief Complaint  ?Patient presents with  ? HFU  ?  Breathing is doing well. She is using Duoneb 2 x daily and uses albuterol inhaler 1-2 x per wk on average.   ?presently in SNF ever since last admit 6/18-29/18 for  ?Active Problems: ?  COPD  GOLD IV  ?  Mass of upper lobe of left lung ?  Chronic respiratory failure with hypoxia (HCC) ?  COPD exacerbation ([place]) ?  Hypokalemia ?  Microcytic anemia ?  SOB (shortness of breath) ?  Protein-calorie malnutrition, severe ?Doe = 2lpm x 130ft with rolling walking  ?Sleeping ok at < 30 degrees ?Doe = 2lpm x 120ft with rolling walker/2lpm ?Sleeping ok at < 30 degrees   ?rec ?02 can be adjusted down and off for saturations above 90%  ?  ?  ? ?12/09/2017  f/u ov/Brooke [name] re: GOLD IV copd/  No 02 at rest/ 2lpm sleep and with activity  ?Chief Complaint  ?Patient presents with  ? Follow-up  ?  Breathing is overall doing well. She has occ cough with white sputum- relates to PND.  She uses her albuterol inhaler 2 x daily on average. She states she uses neb before she knows she is going somewhere.   ?Dyspnea:  MMRC2 = can't walk a nl pace on a flat grade s sob but does fine slow and flat eg still pushing cart  ?Cough: min pnd  ?SABA use: as above  ?rec ?Continue  symbicort and add stiolto 2 pffs each am - - if you don't like it for any reason resume the symbicort alone as per your med calendar  ?Please schedule a follow up visit in 3 months but call sooner if needed  ?- add:  Pt notified above typo should have said add spiriva x  2 pffs each am and continue symbicort Take 2 puffs first thing in am and then another 2 puffs about 12 hours later.  ? ?- 12/12/2017 informed could not tol spiriva due to dry mouth  ? ?03/14/2018  f/u ov/Brooke [name] re:  GOLD IV / 02 is 2lpm  Hs and 2lpm exertion/   RA sitting / just on symbicort 160 2bid  ?Chief Complaint  ?Patient presents with  ? Follow-up  ?  increased cough with white sputum. She states her breathing has been doing well. She is using her albuterol inhaler 2 x per wk and neb maybe 2 x  per wk on average.   ? Dyspnea:  [employer] ok pushing cart on 2lpm  ?Cough: more since 2 weeks  ?Sleeping: flat bed/ 2-3 pillow  ?SABA use: avg as above ?02: as above   ?rec ?Work on inhaler technique:   ?Ok to take augmentin unless it makes you nauseated  ?Please schedule a follow up visit in 3 months but call sooner if needed  ? ? ? ? ?06/13/2018  f/u ov/Brooke [name] re: GOLD IV / 02 hs and prn  ?Chief Complaint  ?Patient presents with  ? Shortness of Breath  ?  Worse since the last visit.  ?Dyspnea:  Still doing [employer]  On 2lpm but not checking sats as rec with activity  ?Cough:  dry worse day than noct ?Sleeping: falt bed / couple of pillows ?SABA use: helps some / uses once a day esp in am ?02: 2lpm hs and prn  ?rec ?Plan A = Automatic = symbicort 160 Take 2 puffs first thing in am and then another 2 puffs about 12 hours later.  ?Work on inhaler technique: ?Plan B = Backup ?Only use your albuterol inhaler as a rescue medication ?Plan C = Crisis ?- only use your albuterol/ipatropium nebulizer if you first try Plan B and it fails to help > ok to use the nebulizer up to every 4 hours but if start needing it regularly call for immediate appointment ?Please schedule a follow up visit in 3 months but call sooner if needed  ?- add chart review does not show alpha one screen done  ? ? ? ?11/30/220 televisit, new hoarse ?rec  ?gerd rx > ENT F/u > sq cell ca vc  ? ? ?Last RT April 20th 2021 in [place]  ? ? ?06/06/2020  f/u ov/Brooke [name] re:  GOLD IV / 02 dep  ?Chief Complaint  ?Patient presents with  ? Follow-up  ?  Sob same, vertigo, did not start Breztri yet  ? Dyspnea:  Still doing 60 ft circuit x 30 most days   Slowed by "asthma feeling" on symbicort  ?Cough: very hoarse, no excess mucus but some pnds  ?Sleeping:  Bed is flat and 2 pillows  ?SABA use: both hfa and nebs but never pre or rechallenges   ?02: 2lpm 24/7 does not check sats at peak ex ?rec ?Try albuterol 15 min before an activity that you know would make you short of breath and see if it makes any difference and if makes none then don't take it after activity unless you can't catch your breath. ?Start Breztri 2 in am and symbicort x 2 pffs 12 hours later - once you finish the symbicort, use breztri just like you did the symbicort  ?Make sure you check your oxygen saturation  at your highest level of activity    ? ?  ?  ?05/11/2021  f/u ov/Brooke [name] re:  Gold 4/02 dep    maint on symb 160  / did not start gerd rx as rec  ?Chief Complaint  ?Patient presents with  ? Follow-up  ?  Breathing is "different" c/o cough- non prod   ? Dyspnea:  100 ft  baseline / not checking sats or titrating 02 to > 90 as rec  ?Cough: worse since last ov occ produces very small yellowish  lump No Better with abx / has not tried medrol  or increased saba to see if they help. ?Sleeping: flat bed  lots of pillows s resp   ?SABA use: no increase  ?02: 2lpm hs and not at rest  ?Covid status:  vax x 3   ?Rec ?Try prilosec otc 20mg   Take 30-60 min before first meal of the day and Pepcid ac (famotidine) 20 mg one @  bedtime until cough is completely gone for at least a week without the need for cough suppression ?GERD diet reviewed, bed blocks rec  ?Change your mucinex to mucinex dm 1200 mg twice daily as needed plus flutter valve  ?Make sure you check your oxygen saturation  AT  your highest level of activity (not after you stop)     ? ? ? ?06/15/2021  f/u ov/Brooke [name] re: GOLD 4/02 dep maint on symb 160   ?Chief Complaint  ?Patient presents with  ? Follow-up  ?  C/o sob not getting any better for 5 wks., energy decreased, cough-white, frothy,wheezing  ?Dyspnea:   100 ft with sats  2lpm stay above 90% per pt ?Cough: min white  ?Sleeping: sitting up mostly ?SABA use: duoneb  ?02: 2lpm prn  ?Persistent new Bilateral positional Mscp  x  2 weeks T 8 level but no spine pain but classic C shape symmetric pattern which is only worse on one side vs the other in different positions  ?Rec ?Depomedrol 120 mg IM  ?Prilosec 20 x  2  = Pantoprazole (protonix) 40 mg  Take  30-60 min before first meal of the day and continue  Pepcid (famotidine)  20 mg after supper until return to office  ?I will be referring you  for orthopedic evaluation for your Thoracic spine fracture > nsaids eliminated pain  ?Please schedule a follow up visit in 3 months but call sooner if needed  ? ? ?08/01/2021  f/u ov/Brooke [name] re: GOLD 4 COPD /02    maint on symbcort 160 /  not taking ppi  ?Chief Complaint  ?Patient presents with  ? Follow-up  ?  Pt states she has been coughing up yellow phlegm. States that her breathing has been worse  since last visit.  ?Dyspnea:  50 ft limiit slow = MMRC3 = can't walk 100 yards even at a slow pace at a flat grade s stopping due to sob   ?Cough: has zpak  no better on day 4  and muciex / flutter valve he

## 2021-08-01 NOTE — Patient Instructions (Addendum)
Plan A = Automatic = Always=    symbicort 160 Take 2 puffs first thing in am and then another 2 puffs about 12 hours later.  ? ?Work on inhaler technique:  relax and gently blow all the way out then take a nice smooth full deep breath back in, triggering the inhaler at same time you start breathing in.  Hold for up to 5 seconds if you can. Blow out thru nose. Rinse and gargle with water when done.  If mouth or throat bother you at all,  try brushing teeth/gums/tongue with arm and hammer toothpaste/ make a slurry and gargle and spit out.  ? ?   ? ?Plan B = Backup (to supplement plan A, not to replace it) ?Only use your albuterol inhaler as a rescue medication to be used if you can't catch your breath by resting or doing a relaxed purse lip breathing pattern.  ?- The less you use it, the better it will work when you need it. ?- Ok to use the inhaler up to 2 puffs  every 4 hours if you must but call for appointment if use goes up over your usual need ?- Don't leave home without it !!  (think of it like the spare tire for your car)  ? ?Plan C = Crisis (instead of Plan B but only if Plan B stops working) ?- only use your albuterol nebulizer if you first try Plan B and it fails to help > ok to use the nebulizer up to every 4 hours but if start needing it regularly call for immediate appointment ? ? ?Depomedrol 120 mg IM  ? ?Pantoprazole (protonix) 40 mg   Take  30-60 min before first meal of the day and Pepcid (famotidine)  20 mg after supper until return to office - this is the best way to tell whether stomach acid is contributing to your problem.   ? ?Make sure you check your oxygen saturation  AT  your highest level of activity (not after you stop)   to be sure it stays over 90% and adjust  02 flow upward to maintain this level if needed but remember to turn it back to previous settings when you stop (to conserve your supply).  ? ?Please schedule a follow up visit in 3 months but call sooner if needed  ? ?  ?

## 2021-08-02 ENCOUNTER — Encounter: Payer: Self-pay | Admitting: Internal Medicine

## 2021-08-02 NOTE — Assessment & Plan Note (Addendum)
Rx 2lpm hs and prn daytime since 2014  ?-  06/13/2018   Walked on 2lpm x  3 laps @  approx 274ft each @ nl pace  stopped due to  Sob after each lap  s desat   ?- 05/11/2021   Walked on RA  x  1  lap(s) =  approx 250  ft  @ avg pace, stopped due to sob/ back pain with lowest 02 sats 95%   ? ?Reminded: ?Make sure you check your oxygen saturation  AT  your highest level of activity (not after you stop)   to be sure it stays over 90% and adjust  02 flow upward to maintain this level if needed but remember to turn it back to previous settings when you stop (to conserve your supply).  ? ?F/u q 3 m, sooner prn ? ?    ?  ? ?Each maintenance medication was reviewed in detail including emphasizing most importantly the difference between maintenance and prns and under what circumstances the prns are to be triggered using an action plan format where appropriate. ? ?Total time for H and P, chart review, counseling, reviewing 02 device(s) and generating customized AVS unique to this office visit / same day charting = 22 min  ?     ?

## 2021-08-02 NOTE — Assessment & Plan Note (Signed)
See CT Chest 01/26/16 c/w ? Stage  lung ca arising in LUL ?- PET 02/28/16 1. Large lobular hypermetabolic mass in the LEFT upper lobe abutting ?the oblique fissure consists with primary bronchogenic carcinoma. ?2. Evidence of ipsilateral hypermetabolic mediastinal nodal ?Metastasis only > 02/29/2016  referred for EBUS/ Dr [place] Bells > declined  ?-11/01/16   CT directed bx  Pos [place] LUL  > RT palliative completed 11/21/16 / rx per oncology = Keytruda  ?- CT 02/27/18 fibrosis of LUL / nodes smaller ?- CT 02/12/2019 ?Post treatment changes in left upper lobe unchanged from previous ?Exam. Continued decrease in size of splenic metastasis. ??Stable appearance of left paratracheal lymph nodes. ??Stable gastrohepatic adenopathy with incomplete visualization of ?retroperitoneal lymph nodes in the upper abdomen. ? ?F/u CT planned per pt  ?

## 2021-08-02 NOTE — Assessment & Plan Note (Signed)
Quit smoking 2009 ?Spirometry 02/15/2016  FEV1 0.60 (28%)  Ratio 38 with classic curvature  p am symbicort 160  ?- 01/08/2017    continue dulera 200 2bid  ?- 12/09/2017  After extensive coaching inhaler device  effectiveness =    90% with smi >add spiriva to  symbicort  ?- 12/12/2017 informed could not tol spiriva due to dry mouth - ?- 06/13/2018   continue symbicort 160 ?- 01/18/2020  After extensive coaching inhaler device,  effectiveness =  90%  ?- 06/06/2020 changed to breztri  ?Referred to pulmonary rehab 08/22/20  > has not heard from rehab as of 09/12/2020    ?- 03/20/21      alpha one AT phenotype  MM 161  ?Added flutter valve 05/11/2021  ?- 08/01/2021  After extensive coaching inhaler device,  effectiveness =    75% ? ?DDX of  difficult airways management almost all start with A and  include Adherence, Ace Inhibitors, Acid Reflux, Active Sinus Disease, Alpha 1 Antitripsin deficiency, Anxiety masquerading as Airways dz,  ABPA,  Allergy(esp in young), Aspiration (esp in elderly), Adverse effects of meds,  Active smoking or vaping, A bunch of PE's (a small clot burden can't cause this syndrome unless there is already severe underlying pulm or vascular dz with poor reserve) plus two Bs  = Bronchiectasis and Beta blocker use..and one C= CHF ? ?Adherence is always the initial "prime suspect" and is a multilayered concern that requires a "trust but verify" approach in every patient - starting with knowing how to use medications, especially inhalers, correctly, keeping up with refills and understanding the fundamental difference between maintenance and prns vs those medications only taken for a very short course and then stopped and not refilled.  ?- see hfa teaching, encouraged to bring them with her ? ?? Allergy/asthma> high dose ICS and depomedrol 120 mg IM at he request (says medrol pills don't work) ? ?? Acid (or non-acid) GERD > always difficult to exclude as up to 75% of pts in some series report no assoc GI/ Heartburn  symptoms> rec max (24h)  acid suppression and diet restrictions/ reviewed and instructions given in writing.  ? ?? Anxiety/depression/ deconditioning  > usually at the bottom of this list of usual suspects but should be much higher on this pt's based on H and P and note already on psychotropics and may interfere with adherence and also interpretation of response or lack thereof to symptom management which can be quite subjective.  ? ?  ? ? ?

## 2021-08-06 ENCOUNTER — Other Ambulatory Visit: Payer: Self-pay | Admitting: Oncology

## 2021-08-08 ENCOUNTER — Telehealth: Payer: Self-pay | Admitting: Internal Medicine

## 2021-08-08 MED ORDER — AMOXICILLIN-POT CLAVULANATE 875-125 MG PO TABS: 1.0000 | ORAL_TABLET | Freq: Two times a day (BID) | ORAL | 0 refills | Status: AC

## 2021-08-08 MED ORDER — PREDNISONE 10 MG PO TABS
ORAL_TABLET | ORAL | 0 refills | Status: DC
Start: 2021-08-08 — End: 2021-08-30

## 2021-08-08 NOTE — Telephone Encounter (Signed)
Augmentin 875 mg take one pill twice daily  X 10 days - take at breakfast and supper with large glass of water.  It would help reduce the usual side effects (diarrhea and yeast infections) if you ate cultured yogurt at lunch.  ? ?Prednisone 10 mg take  4 each am x 2 days,   2 each am x 2 days,  1 each am x 2 days and stop  ?

## 2021-08-08 NOTE — Telephone Encounter (Signed)
Spoke with the pt and notified of response per Dr Melvyn Novas  ?Pt verbalized understanding  ?Nothing further needed  ?Rx sent to pharm ?

## 2021-08-08 NOTE — Telephone Encounter (Signed)
Spoke with the pt ?She states still having cough with yellow sputum despite zpack  ?She is wheezing some  ?She feels that her SOB is worse than 08/01/21 when she was seen last  ?She is taking symbicort and using her albuterol inhaler and neb daily  ?She has not tried mucinex- advised to take this prn cough/congestion  ?I offered appt and she refused, stating that she was just here a wk ago and does not want visit  ?She is not having any fevers or aches  ?Please advise, thanks! ? ?Allergies  ?Allergen Reactions  ? Afrin [Oxymetazoline] Anxiety  ? Codeine Nausea And Vomiting  ? Imdur [Isosorbide Dinitrate]   ?  headache  ? Prednisone Other (See Comments)  ?  Reaction:Abnormal behavior; cannot take in pill form but CAN tolerate the injection  ? Pulmicort [Budesonide]   ?  "feeling of torture"  ? Sulfa Antibiotics   ?  Hallucinations, decreased appetite  ? ? ?

## 2021-08-09 ENCOUNTER — Telehealth: Payer: Self-pay | Admitting: Internal Medicine

## 2021-08-10 MED ORDER — AMOXICILLIN-POT CLAVULANATE 875-125 MG PO TABS: 1.0000 | ORAL_TABLET | Freq: Two times a day (BID) | ORAL | 0 refills | Status: DC

## 2021-08-10 NOTE — Telephone Encounter (Signed)
Left message for patient to call back  

## 2021-08-10 NOTE — Telephone Encounter (Signed)
She wants to know if you meant to send in oral prednisone and she has picked up the prescription already. I have sent additional 8 tablets to the pharmacy for the remainder of prescription.  ? ?I called [facility] and per the pharmacist she wants to ask per the patient request why the provider chose to send in oral prednisone as she is allergic to the medication. Please advise.  ?

## 2021-08-10 NOTE — Telephone Encounter (Signed)
I don't really think she's allergic to prednisone as her body makes it naturally but if still has concerns ok to change to medrol 4 mg   ? ? Rx 4 each am x 2 days,   2 each am x 2 days,  1 each am x 2 days and stop  ? ?No more rx s ov first  ?

## 2021-08-10 NOTE — Addendum Note (Signed)
Addended by: Lorretta Harp on: 08/10/2021 02:02 PM ? ? Modules accepted: Orders ? ?

## 2021-08-11 ENCOUNTER — Inpatient Hospital Stay: Payer: Medicare Other

## 2021-08-11 ENCOUNTER — Inpatient Hospital Stay (HOSPITAL_BASED_OUTPATIENT_CLINIC_OR_DEPARTMENT_OTHER): Payer: Medicare Other | Admitting: Nurse Practitioner

## 2021-08-11 ENCOUNTER — Encounter: Payer: Self-pay | Admitting: *Deleted

## 2021-08-11 ENCOUNTER — Ambulatory Visit (HOSPITAL_BASED_OUTPATIENT_CLINIC_OR_DEPARTMENT_OTHER)
Admission: RE | Admit: 2021-08-11 | Discharge: 2021-08-11 | Disposition: A | Discharge status: Home / Self Care | Payer: Medicare Other | Source: Ambulatory Visit | Attending: Nurse Practitioner | Admitting: Nurse Practitioner

## 2021-08-11 ENCOUNTER — Inpatient Hospital Stay: Payer: Medicare Other | Attending: Oncology

## 2021-08-11 ENCOUNTER — Encounter: Payer: Self-pay | Admitting: Nurse Practitioner

## 2021-08-11 VITALS — BP 143/59 | HR 79 | Temp 98.9°F | Resp 16 | Wt 89.2 lb

## 2021-08-11 DIAGNOSIS — C3412 Malignant neoplasm of upper lobe, left bronchus or lung: Secondary | ICD-10-CM

## 2021-08-11 DIAGNOSIS — Z86711 Personal history of pulmonary embolism: Secondary | ICD-10-CM | POA: Insufficient documentation

## 2021-08-11 DIAGNOSIS — Z5112 Encounter for antineoplastic immunotherapy: Secondary | ICD-10-CM | POA: Insufficient documentation

## 2021-08-11 DIAGNOSIS — C787 Secondary malignant neoplasm of liver and intrahepatic bile duct: Secondary | ICD-10-CM | POA: Diagnosis not present

## 2021-08-11 DIAGNOSIS — Z9981 Dependence on supplemental oxygen: Secondary | ICD-10-CM | POA: Diagnosis not present

## 2021-08-11 DIAGNOSIS — M4854XA Collapsed vertebra, not elsewhere classified, thoracic region, initial encounter for fracture: Secondary | ICD-10-CM | POA: Insufficient documentation

## 2021-08-11 DIAGNOSIS — Z8709 Personal history of other diseases of the respiratory system: Secondary | ICD-10-CM | POA: Insufficient documentation

## 2021-08-11 DIAGNOSIS — Z923 Personal history of irradiation: Secondary | ICD-10-CM | POA: Diagnosis not present

## 2021-08-11 DIAGNOSIS — Z79899 Other long term (current) drug therapy: Secondary | ICD-10-CM | POA: Insufficient documentation

## 2021-08-11 DIAGNOSIS — C7889 Secondary malignant neoplasm of other digestive organs: Secondary | ICD-10-CM | POA: Insufficient documentation

## 2021-08-11 DIAGNOSIS — K7689 Other specified diseases of liver: Secondary | ICD-10-CM | POA: Insufficient documentation

## 2021-08-11 DIAGNOSIS — I2510 Atherosclerotic heart disease of native coronary artery without angina pectoris: Secondary | ICD-10-CM | POA: Diagnosis not present

## 2021-08-11 DIAGNOSIS — J439 Emphysema, unspecified: Secondary | ICD-10-CM | POA: Insufficient documentation

## 2021-08-11 DIAGNOSIS — C772 Secondary and unspecified malignant neoplasm of intra-abdominal lymph nodes: Secondary | ICD-10-CM | POA: Diagnosis not present

## 2021-08-11 DIAGNOSIS — I252 Old myocardial infarction: Secondary | ICD-10-CM | POA: Diagnosis not present

## 2021-08-11 DIAGNOSIS — M549 Dorsalgia, unspecified: Secondary | ICD-10-CM | POA: Insufficient documentation

## 2021-08-11 DIAGNOSIS — I3139 Other pericardial effusion (noninflammatory): Secondary | ICD-10-CM | POA: Diagnosis not present

## 2021-08-11 DIAGNOSIS — I700 Atherosclerosis of aorta: Secondary | ICD-10-CM | POA: Diagnosis not present

## 2021-08-11 DIAGNOSIS — Z8701 Personal history of pneumonia (recurrent): Secondary | ICD-10-CM | POA: Insufficient documentation

## 2021-08-11 DIAGNOSIS — J440 Chronic obstructive pulmonary disease with acute lower respiratory infection: Secondary | ICD-10-CM | POA: Insufficient documentation

## 2021-08-11 DIAGNOSIS — R21 Rash and other nonspecific skin eruption: Secondary | ICD-10-CM | POA: Insufficient documentation

## 2021-08-11 LAB — CMP (CANCER CENTER ONLY)
ALT: 18 U/L (ref 0–44)
AST: 17 U/L (ref 15–41)
Albumin: 4.2 g/dL (ref 3.5–5.0)
Alkaline Phosphatase: 56 U/L (ref 38–126)
Anion gap: 6 (ref 5–15)
BUN: 11 mg/dL (ref 8–23)
CO2: 30 mmol/L (ref 22–32)
Calcium: 9.7 mg/dL (ref 8.9–10.3)
Chloride: 100 mmol/L (ref 98–111)
Creatinine: 0.44 mg/dL (ref 0.44–1.00)
GFR, Estimated: >60 mL/min (ref >60)
Glucose, Bld: 102 mg/dL — ABNORMAL HIGH (ref 70–99)
Potassium: 4 mmol/L (ref 3.5–5.1)
Sodium: 136 mmol/L (ref 135–145)
Total Bilirubin: 0.4 mg/dL (ref 0.3–1.2)
Total Protein: 6.8 g/dL (ref 6.5–8.1)

## 2021-08-11 MED ORDER — SODIUM CHLORIDE 0.9 % IV SOLN
200.0000 mg | Freq: Once | INTRAVENOUS | Status: AC
  Administered 2021-08-11: 200 mg via INTRAVENOUS
  Filled 2021-08-11: qty 8

## 2021-08-11 MED ORDER — SODIUM CHLORIDE 0.9 % IV SOLN: Freq: Once | INTRAVENOUS | Status: AC

## 2021-08-11 NOTE — Patient Instructions (Signed)
[facility]   ?Discharge Instructions: ?Thank you for choosing [facility] to provide your oncology and hematology care.  ? ?If you have a lab appointment with the [facility], please go directly to the [facility] and check in at the registration area. ?  ?Wear comfortable clothing and clothing appropriate for easy access to any Portacath or PICC line.  ? ?We strive to give you quality time with your provider. You may need to reschedule your appointment if you arrive late (15 or more minutes).  Arriving late affects you and other patients whose appointments are after yours.  Also, if you miss three or more appointments without notifying the office, you may be dismissed from the clinic at the provider?s discretion.    ?  ?For prescription refill requests, have your pharmacy contact our office and allow 72 hours for refills to be completed.   ? ?Today you received the following chemotherapy and/or immunotherapy agents Keytruda    ?  ?To help prevent nausea and vomiting after your treatment, we encourage you to take your nausea medication as directed. ? ?BELOW ARE SYMPTOMS THAT SHOULD BE REPORTED IMMEDIATELY: ?*FEVER GREATER THAN 100.4 F (38 ?C) OR HIGHER ?*CHILLS OR SWEATING ?*NAUSEA AND VOMITING THAT IS NOT CONTROLLED WITH YOUR NAUSEA MEDICATION ?*UNUSUAL SHORTNESS OF BREATH ?*UNUSUAL BRUISING OR BLEEDING ?*URINARY PROBLEMS (pain or burning when urinating, or frequent urination) ?*BOWEL PROBLEMS (unusual diarrhea, constipation, pain near the anus) ?TENDERNESS IN MOUTH AND THROAT WITH OR WITHOUT PRESENCE OF ULCERS (sore throat, sores in mouth, or a toothache) ?UNUSUAL RASH, SWELLING OR PAIN  ?UNUSUAL VAGINAL DISCHARGE OR ITCHING  ? ?Items with * indicate a potential emergency and should be followed up as soon as possible or go to the Emergency Department if any problems should occur. ? ?Please show the CHEMOTHERAPY ALERT CARD or IMMUNOTHERAPY ALERT CARD at check-in to the  Emergency Department and triage nurse. ? ?Should you have questions after your visit or need to cancel or reschedule your appointment, please contact [facility]  Dept: [phone]  and follow the prompts.  Office hours are 8:00 a.m. to 4:30 p.m. Monday - Friday. Please note that voicemails left after 4:00 p.m. may not be returned until the following business day.  We are closed weekends and major holidays. You have access to a nurse at all times for urgent questions. Please call the main number to the clinic Dept: [phone] and follow the prompts. ? ? ?For any non-urgent questions, you may also contact your provider using MyChart. We now offer e-Visits for anyone 72 and older to request care online for non-urgent symptoms. For details visit [email or web address]. ?  ?Also download the MyChart app! Go to the app store, search "MyChart", open the app, select [facility], and log in with your MyChart username and password. ? ?Due to Covid, a mask is required upon entering the hospital/clinic. If you do not have a mask, one will be given to you upon arrival. For doctor visits, patients may have 1 support person aged 50 or older with them. For treatment visits, patients cannot have anyone with them due to current Covid guidelines and our immunocompromised population.  ? ?Pembrolizumab injection ?What is this medication? ?PEMBROLIZUMAB (pem broe liz ue mab) is a monoclonal antibody. It is used to treat certain types of cancer. ?This medicine may be used for other purposes; ask your health care provider or pharmacist if you have questions. ?COMMON BRAND NAME(S): Keytruda ?  What should I tell my care team before I take this medication? ?They need to know if you have any of these conditions: ?autoimmune diseases like Crohn's disease, ulcerative colitis, or lupus ?have had or planning to have an allogeneic stem cell transplant (uses someone else's stem cells) ?history of organ  transplant ?history of chest radiation ?nervous system problems like myasthenia gravis or Guillain-Barre syndrome ?an unusual or allergic reaction to pembrolizumab, other medicines, foods, dyes, or preservatives ?pregnant or trying to get pregnant ?breast-feeding ?How should I use this medication? ?This medicine is for infusion into a vein. It is given by a health care professional in a hospital or clinic setting. ?A special MedGuide will be given to you before each treatment. Be sure to read this information carefully each time. ?Talk to your pediatrician regarding the use of this medicine in children. While this drug may be prescribed for children as young as 6 months for selected conditions, precautions do apply. ?Overdosage: If you think you have taken too much of this medicine contact a poison control center or emergency room at once. ?NOTE: This medicine is only for you. Do not share this medicine with others. ?What if I miss a dose? ?It is important not to miss your dose. Call your doctor or health care professional if you are unable to keep an appointment. ?What may interact with this medication? ?Interactions have not been studied. ?This list may not describe all possible interactions. Give your health care provider a list of all the medicines, herbs, non-prescription drugs, or dietary supplements you use. Also tell them if you smoke, drink alcohol, or use illegal drugs. Some items may interact with your medicine. ?What should I watch for while using this medication? ?Your condition will be monitored carefully while you are receiving this medicine. ?You may need blood work done while you are taking this medicine. ?Do not become pregnant while taking this medicine or for 4 months after stopping it. Women should inform their doctor if they wish to become pregnant or think they might be pregnant. There is a potential for serious side effects to an unborn child. Talk to your health care professional or  pharmacist for more information. Do not breast-feed an infant while taking this medicine or for 4 months after the last dose. ?What side effects may I notice from receiving this medication? ?Side effects that you should report to your doctor or health care professional as soon as possible: ?allergic reactions like skin rash, itching or hives, swelling of the face, lips, or tongue ?bloody or black, tarry ?breathing problems ?changes in vision ?chest pain ?chills ?confusion ?constipation ?cough ?diarrhea ?dizziness or feeling faint or lightheaded ?fast or irregular heartbeat ?fever ?flushing ?joint pain ?low blood counts - this medicine may decrease the number of white blood cells, red blood cells and platelets. You may be at increased risk for infections and bleeding. ?muscle pain ?muscle weakness ?pain, tingling, numbness in the hands or feet ?persistent headache ?redness, blistering, peeling or loosening of the skin, including inside the mouth ?signs and symptoms of high blood sugar such as dizziness; dry mouth; dry skin; fruity breath; nausea; stomach pain; increased hunger or thirst; increased urination ?signs and symptoms of kidney injury like trouble passing urine or change in the amount of urine ?signs and symptoms of liver injury like dark urine, light-colored stools, loss of appetite, nausea, right upper belly pain, yellowing of the eyes or skin ?sweating ?swollen lymph nodes ?weight loss ?Side effects that usually do not require  medical attention (report to your doctor or health care professional if they continue or are bothersome): ?decreased appetite ?hair loss ?tiredness ?This list may not describe all possible side effects. Call your doctor for medical advice about side effects. You may report side effects to FDA at 1-800-FDA-1088. ?Where should I keep my medication? ?This drug is given in a hospital or clinic and will not be stored at home. ?NOTE: This sheet is a summary. It may not cover all possible  information. If you have questions about this medicine, talk to your doctor, pharmacist, or health care provider. ?? 2022 Elsevier/Gold Standard (2021-01-10 00:00:00) ? ?

## 2021-08-11 NOTE — Progress Notes (Signed)
Patient seen by [name] NP today ? ?Vitals are within treatment parameters. ? ?Labs reviewed by [name] NP and are within treatment parameters. Will check CBC next cycle. ? ?Per physician team, patient is ready for treatment and there are NO modifications to the treatment plan.  ?

## 2021-08-11 NOTE — Progress Notes (Signed)
Patient presents for treatment. RN assessment completed along with the following: ? ?Labs/vitals reviewed - Labs reviewed by [name] NP and are within treatment parameters. Will check CBC next cycle.   ?Weight within 10% of previous measurement - Yes ?Informed consent completed and reflects current therapy/intent - Yes, on date 12/14/16             ?Provider progress note reviewed - Yes, today's provider note was reviewed. ?Treatment/Antibody/Supportive plan reviewed - Yes, and there are no adjustments needed for today's treatment. ?S&H and other orders reviewed - Yes, and there are no additional orders identified. ?Previous treatment date reviewed - Yes, and the appropriate amount of time has elapsed between treatments. ?Clinic Hand Off Received from - Cristy Friedlander, RN ? ?Patient to proceed with treatment.   ?

## 2021-08-11 NOTE — Telephone Encounter (Signed)
Lm for patient.  

## 2021-08-11 NOTE — Progress Notes (Signed)
?Brooke [name] ?OFFICE PROGRESS NOTE ? ? ?Diagnosis: Non-small cell lung cancer ? ?INTERVAL HISTORY:  ? ?Brooke [name] returns as scheduled.  She completed another cycle of Pembrolizumab 07/21/2021.  No rash or diarrhea.  She reports a continued productive cough and shortness of breath.  This has been ongoing for "many months".  No fever.  She is currently on a course of Augmentin and steroids.  She continues to have pain at the left back/side.  No rash. ? ?Objective: ? ?Vital signs in last 24 hours: ? ?Blood pressure (!) 143/59, pulse 79, temperature 98.9 ?F (37.2 ?C), temperature source Tympanic, resp. rate 16, weight 89 lb 3.2 oz (40.5 kg), SpO2 100 %. ?  ? ?HEENT: No thrush or ulcers. ?Resp: Distant breath sounds.  Faint end inspiratory wheezes.  No respiratory distress. ?Cardio: Regular rate and rhythm. ?GI: No hepatosplenomegaly. ?Vascular: No leg edema. ?Neuro: Alert and oriented. ?Skin: No rash. ? ? ?Lab Results: ? ?Lab Results  ?Component Value Date  ? WBC 4.4 06/30/2021  ? HGB 13.5 06/30/2021  ? HCT 42.1 06/30/2021  ? MCV 90.1 06/30/2021  ? PLT 230 06/30/2021  ? NEUTROABS 3.1 06/30/2021  ? ? ?Imaging: ? ?No results found. ? ?Medications: I have reviewed the patient's current medications. ? 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 ?PDL1 60% ?Left lung radiation 11/08/2016 through 11/21/2016 ?CT chest 12/12/2016-reexpansion of left upper lobe with a decreased left upper lobe mass, unchanged mediastinal adenopathy, progression of a splenic metastasis/pancreatic tail/gastrohepatic  ligament metastasis, right lower lobe pneumonia ?Cycle 1 Pembrolizumab 12/14/2016 ?Cycle 2 Pembrolizumab 01/03/2017 ?Cycle 3 Pembrolizumab 01/24/2017 ?Cycle 4 Pembrolizumab 02/12/2017 ?CT 03/05/2018-decrease in left upper lobe mass, mediastinal adenopathy, and splenic mass ?Cycle 5 pembrolizumab 03/07/2017 ?Cycle 6 Pembrolizumab 04/02/2017 ?Cycle 7 pembrolizumab 04/25/2017 ?Cycle 8 Pembrolizumab 05/14/2017 ?Cycle 9 Pembrolizumab 06/06/2017 ?CT 06/20/2017-mild decrease in left upper lobe mass, mild increase in paramediastinal left upper lobe nodule-radiation change?, decreased splenic metastasis ?Cycle 10 pembrolizumab 06/25/2017 ?Cycle 11 pembrolizumab 07/18/2017 ?Cycle 12 pembrolizumab 08/29/2017 ?Cycle 13 pembrolizumab 09/17/2017 ?Cycle 14 Pembrolizumab 10/10/2017 ?CT chest 10/24/2017- slight enlargement of small mediastinal lymph nodes, increased left upper lobe consolidation, decreased size of spleen lesion ?Cycle 15 pembrolizumab 10/29/2017 ?Cycle 16 Pembrolizumab 11/21/2017 ?Cycle 17 Pembrolizumab 12/11/2017 ?Cycle 18 pembrolizumab 01/02/2018 ?Cycle 19 Pembrolizumab 01/21/2018 ?Cycle 20 Pembrolizumab 02/13/2018 ?CT chest 02/27/2018- decreased left paratracheal node, progressive consolidation/fibrosis in the left upper lung, decreased size of splenic mass ?Cycle 21 pembrolizumab 03/04/2018 ?Cycle 22 Pembrolizumab 03/27/2018  ?Cycle 23 pembrolizumab 04/15/2018 ?Cycle 24 pembrolizumab 05/08/2018 ?Cycle 25 Pembrolizumab 05/27/2018 ?CT chest 06/16/2018- stable left upper lung radiation fibrosis with no evidence of local tumor recurrence.  Decreased left paratracheal adenopathy.  No new or progressive metastatic disease in the chest.  Gastrohepatic ligament lymphadenopathy stable.  Splenic metastasis decreased.  T5 vertebral compression fracture with associated patchy sclerosis and no discrete osseous lesion, new in the interval. ?Cycle 26 Pembrolizumab 06/19/2018 ?Cycle 27 pembrolizumab 07/08/2018 ?Cycle 28 pembrolizumab 07/31/2018 ?Cycle  29 pembrolizumab 08/19/2018 ?Cycle 30 Pembrolizumab 09/11/2018 ?Cycle 31 pembrolizumab 09/30/2018 ?Cycle 32 pembrolizumab 10/23/2018 ?CT chest 10/28/2018- left apical pleural-parenchymal opacity consistent with radiation scarring has become more confluent likely representing evolutionary change.  Continued further decrease  in size of index left paratracheal node and splenic metastasis.  Gastrohepatic ligament lymph node not changed. ?Cycle 33 Pembrolizumab 11/11/2018 ?Cycle 34 pembrolizumab 12/04/2018 ?Cycle 35 pembrolizumab 12/23/2018 ?CT chest 02/12/2019- posttreatment changes left upper lobe unchanged.  Continued decrease in size of splenic metastasis.  Stable appearance of left paratracheal lymph nodes.  Stable gastrohepatic adenopathy.  Incomplete visualization of retroperitoneal lymph nodes in the upper abdomen. ?CT chest 07/02/2019-stable post treatment related changes left lung.  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  Slight regression of enlarged likely metastatic gastrohepatic lymph node. ?CT chest 11/11/2019-interval enlargement of a right paratracheal lymph node measuring 14 mm, increased from 5 mm.  No new lung mass or nodularity.  New adenopathy in the upper abdomen.  2.7 cm lesion gastrohepatic ligament.  Necrotic node at the splenic hilum measures 4.7 cm.  Large node along the IVC left upper quadrant measures 2.7 cm.  Similar node left adjacent the aorta measures 1.8 cm.  Enhancing lesion in the spleen is unchanged.  Several low-density lesions in the liver are unchanged. ?Pembrolizumab resumed on a 3-week schedule 12/03/2019 ?CT chest 02/22/2020-interval resolution of previously demonstrated left paratracheal and upper abdominal adenopathy.  Stable treated metastasis within the spleen.  Stable radiation changes in the left lung with left hilar distortion.  No evidence of local recurrence or progressive metastatic disease. ?Continuation of pembrolizumab every 3 weeks 02/26/2020 ?CT chest 06/28/2020-no change in  left upper lobe consolidation, unchanged spleen metastasis, no evidence of progressive disease ?Pembrolizumab continued every 3 weeks 07/01/2020 ?CT chest 11/02/2020-stable posttreatment changes at the left hilum and left upper lobe, decreased size of spleen lesion, no evidence of disease progression ?Pembrolizumab every 3 weeks continued ?CT chest 01/12/2021-negative for acute pulmonary embolus.  Stable radiation fibrosis left upper lobe.  Stable low-density lesion in the spleen.  No new or progressive findings. 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  Splenic lesion stable.  No upper abdominal lymphadenopathy. ?Pembrolizumab every 3 weeks continued ?  ?  ?2.  05/29/2019 laryngoscopy-exophytic appearing mass mid right vocal cord.   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  Tumor cells negative for p16, CMV and HSV 2.  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 ?Radiation 07/29/2019-08/25/2019 ?  ?3. History of acute respiratory failure secondary to #1 ?  ?4. Oxygen dependent COPD ?  ?5. History of a NSTEMI 2015 ?  ?6. Right lung pneumonia on chest CT 12/12/2016-treated with Levaquin ?  ?7.  Grade 2 rash possibly related to Pembrolizumab.  Treatment held 08/06/2017.  Improved 08/29/2017, treatment resumed, rash has resolved ?  ?8.  Vertigo January 2022-improved with course of Augmentin for ear/sinus infection ?  ?  ? ?Disposition: Brooke [name] appears unchanged.  She will continue every 3-week Pembrolizumab.  Restaging chest CT prior to next office visit. ? ?Chemistry panel from today reviewed.  Labs adequate to proceed with treatment. ? ?She has continued dyspnea and cough.  This is likely related to COPD.  Plan for chest x-ray today.  She  continues follow-up with Dr. Wert. ? ?She will return for follow-up in 3 weeks, restaging chest CT a few days prior. ? ?Plan reviewed with Dr. Sherrill. ? ? ? ?Lisa Thomas ANP/GNP-BC  ? ?08/11/2021  ?9:26 A

## 2021-08-14 ENCOUNTER — Telehealth: Payer: Self-pay | Admitting: Internal Medicine

## 2021-08-14 NOTE — Telephone Encounter (Signed)
See encounter from last week.  ?

## 2021-08-14 NOTE — Telephone Encounter (Signed)
Called and spoke with patient. I offered to send in the medrol for her but she wants to hold off for now. She is aware that the amox has been sent in for her.  ? ?Nothing further needed at time of call.  ?

## 2021-08-18 ENCOUNTER — Telehealth: Payer: Self-pay | Admitting: Internal Medicine

## 2021-08-18 NOTE — Telephone Encounter (Signed)
Called patient but she did not answer. Left message for her to call us back.  

## 2021-08-18 NOTE — Telephone Encounter (Signed)
I called and spoke with the pt  ?She states had episode of SOB today after taking her augmentin  ?She thinks the SOB was from taking augmentin  ?She used her rescue inhaler and this helped   ?She feels her breathing has improved to baseline  ?She is not coughing up discolored sputum so she is asking if she can go ahead and stop the augmentin  ?Please advise thanks ?

## 2021-08-18 NOTE — Telephone Encounter (Signed)
Brooke Spatz, NP ?to Me   ?   2:44 PM ? She needs to stop the Augmentin. She needs an appointment with Dr. Melvyn Novas to determine if she needs further antibiotic treatment. She has taken Augmentin multiple times in the past with no issues. But we will air on the side of caution and stop the medication. If she develops any mouth or tongue swelling she needs to seek emergency care. ? ?Spoke with the pt and notified of response per SG. Pt verbalized understanding. I have scheduled appt with MW for 08/30/21.  ? ?

## 2021-08-26 ENCOUNTER — Other Ambulatory Visit: Payer: Self-pay | Admitting: Oncology

## 2021-08-29 ENCOUNTER — Other Ambulatory Visit (HOSPITAL_BASED_OUTPATIENT_CLINIC_OR_DEPARTMENT_OTHER): Payer: Self-pay

## 2021-08-29 ENCOUNTER — Ambulatory Visit (HOSPITAL_BASED_OUTPATIENT_CLINIC_OR_DEPARTMENT_OTHER)
Admission: RE | Admit: 2021-08-29 | Discharge: 2021-08-29 | Disposition: A | Discharge status: Home / Self Care | Payer: Medicare Other | Source: Ambulatory Visit | Attending: Nurse Practitioner | Admitting: Nurse Practitioner

## 2021-08-29 ENCOUNTER — Encounter (HOSPITAL_BASED_OUTPATIENT_CLINIC_OR_DEPARTMENT_OTHER): Payer: Self-pay

## 2021-08-29 DIAGNOSIS — Z5112 Encounter for antineoplastic immunotherapy: Secondary | ICD-10-CM | POA: Diagnosis present

## 2021-08-29 DIAGNOSIS — C3412 Malignant neoplasm of upper lobe, left bronchus or lung: Secondary | ICD-10-CM | POA: Diagnosis not present

## 2021-08-29 MED ORDER — IOHEXOL 300 MG/ML  SOLN
100.0000 mL | Freq: Once | INTRAMUSCULAR | Status: AC | PRN
  Administered 2021-08-29: 50 mL via INTRAVENOUS

## 2021-08-29 NOTE — Progress Notes (Signed)
Error

## 2021-08-30 ENCOUNTER — Ambulatory Visit (INDEPENDENT_AMBULATORY_CARE_PROVIDER_SITE_OTHER): Payer: Medicare Other | Admitting: Internal Medicine

## 2021-08-30 ENCOUNTER — Encounter: Payer: Self-pay | Admitting: Internal Medicine

## 2021-08-30 VITALS — BP 100/80 | HR 80 | Temp 98.1°F | Ht 63.0 in | Wt 89.2 lb

## 2021-08-30 DIAGNOSIS — J9611 Chronic respiratory failure with hypoxia: Secondary | ICD-10-CM

## 2021-08-30 DIAGNOSIS — J441 Chronic obstructive pulmonary disease with (acute) exacerbation: Secondary | ICD-10-CM

## 2021-08-30 DIAGNOSIS — J449 Chronic obstructive pulmonary disease, unspecified: Secondary | ICD-10-CM | POA: Diagnosis not present

## 2021-08-30 MED ORDER — AZITHROMYCIN 250 MG PO TABS: ORAL_TABLET | ORAL | 0 refills | Status: DC

## 2021-08-30 MED ORDER — METHYLPREDNISOLONE 4 MG PO TABS: ORAL_TABLET | ORAL | 0 refills | Status: DC

## 2021-08-30 MED ORDER — METHYLPREDNISOLONE ACETATE 80 MG/ML IJ SUSP
80.0000 mg | Freq: Once | INTRAMUSCULAR | Status: AC
  Administered 2021-08-30: 80 mg via INTRAMUSCULAR

## 2021-08-30 NOTE — Progress Notes (Signed)
?Subjective:  ?  ?Patient ID: Brooke [name], female   DOB: 1945-11-25,    MRN: [id number] ? ?  ? ?Brief patient profile:  ?75 yowf quit smoking 2009 on 02 noct 2012 maint on symbicort 160 since aorund  2014 referred to pulmonary clinic 02/15/2016 by Dr   Kathryne Eriksson for RUL Lung mass dx with Stage IV nsc 11/01/16 in setting of GOLD IV copd  ? ? ? ? ?History of Present Illness  ?02/15/2016 1st [facility] office visit/ Alynna Hargrove  GOLD IV copd/ symb 160 2bid maint  ?Chief Complaint  ?Patient presents with  ? Pulmonary Consult  ?  referred by Dr. Kathryne Eriksson for COPD.  ?MMRC2 = can't walk a nl pace on a flat grade s sob but does fine slow and flat eg [employer]/ [employer] on 2lpm  ?New onset bloody mucus plugs November 11 2015 assoc with wt loss  ?02 2lpm hs and with walking / rare saba needed ?rec ?For cough > mucinex up to 1200 mg every 12 hours as needed ?Rec:  Fob but refused ? ? ?11/01/16   CT directed bx >>>  Pos NSC LUL   ? ?Diagnosis:   Stage IV NSCLC, Squamous cell carcinoma of the left upper lobe.    ?  ?Indication for treatment:  palliative      ?  ?Radiation treatment dates:   11/08/2016 to 11/21/2016 ?  ?Site/dose:   The Left lung was treated to 30 Gy in 10 fractions at 3 Gy per fraction.  ?  ?Beams/energy:   3D // 10X, 6X ? ? ? ?06/13/2018  f/u ov/Brooke [name] re: GOLD IV / 02 hs and prn  ?Chief Complaint  ?Patient presents with  ? Shortness of Breath  ?  Worse since the last visit.  ?Dyspnea:  Still doing [employer]  On 2lpm but not checking sats as rec with activity  ?Cough: dry worse day than noct ?Sleeping: falt bed / couple of pillows ?SABA use: helps some / uses once a day esp in am ?02: 2lpm hs and prn  ?rec ?Plan A = Automatic = symbicort 160 Take 2 puffs first thing in am and then another 2 puffs about 12 hours later.  ?Work on inhaler technique: ?Plan B = Backup ?Only use your albuterol inhaler as a rescue medication ?Plan C = Crisis ?- only use your albuterol/ipatropium nebulizer if you first try Plan B and  it fails to help > ok to use the nebulizer up to every 4 hours but if start needing it regularly call for immediate appointment ?Please schedule a follow up visit in 3 months but call sooner if needed  ?- add chart review does not show alpha one screen done  ? ? ? ?11/30/220 televisit, new hoarse ?rec  ?gerd rx > ENT F/u > sq cell ca vc  ? ? ?Finishied RT April 20th 2021 in [place]  ? ? ?06/15/2021  f/u ov/Brooke [name] re: GOLD 4/02 dep maint on symb 160   ?Chief Complaint  ?Patient presents with  ? Follow-up  ?  C/o sob not getting any better for 5 wks., energy decreased, cough-white, frothy,wheezing  ?Dyspnea:   100 ft with sats  2lpm stay above 90% per pt ?Cough: min white  ?Sleeping: sitting up mostly ?SABA use: duoneb  ?02: 2lpm prn  ?Persistent new Bilateral positional Mscp  x  2 weeks T 8 level but no spine pain but classic C shape symmetric pattern which is only worse on  one side vs the other in different positions  ?Rec ?Depomedrol 120 mg IM  ?Prilosec 20 x  2  = Pantoprazole (protonix) 40 mg  Take  30-60 min before first meal of the day and continue  Pepcid (famotidine)  20 mg after supper until return to office  ?I will be referring you  for orthopedic evaluation for your Thoracic spine fracture > nsaids eliminated pain  ?Please schedule a follow up visit in 3 months but call sooner if needed  ? ? ?08/01/2021  f/u ov/Brooke [name] re: GOLD 4 COPD /02    maint on symbcort 160 /  not taking ppi  ?Chief Complaint  ?Patient presents with  ? Follow-up  ?  Pt states she has been coughing up yellow phlegm. States that her breathing has been worse since last visit.  ?Dyspnea:  50 ft limiit slow = MMRC3 = can't walk 100 yards even at a slow pace at a flat grade s stopping due to sob   ?Cough: has zpak  no better on day 4  and muciex / flutter valve helps ?Sleeping: > 45 degrees  ?SABA use: 3 per day, neb x 3  ?02: 2lpm 24/7 but 4lpm  ?Rec ?Plan A = Automatic = Always=    symbicort 160 Take 2 puffs first thing in am and then  another 2 puffs about 12 hours later.  ?Work on inhaler technique:   ?Plan B = Backup (to supplement plan A, not to replace it) ?Only use your albuterol inhaler as a rescue medication ?Plan C = Crisis (instead of Plan B but only if Plan B stops working) ?- only use your albuterol nebulizer if you first try Plan B and it fails to help  ?Depomedrol 120 mg IM  ?Pantoprazole (protonix) 40 mg   Take  30-60 min before first meal of the day and Pepcid (famotidine)  20 mg after supper until return to office ?Make sure you check your oxygen saturation  AT  your highest level of activity (not after you stop)  ? ? ? ? ? ?08/30/2021  f/u ov/Brooke [name] re: GOLD IV COPD /02 hs and prn  maint on symbicort 160/ [place] ?Chief Complaint  ?Patient presents with  ? Follow-up  ?  Patient has lost more weight, wears 1.5 to 2 liters all the time. Patient feels like breathing is worse since last visit. Productive cough.  ?Dyspnea:  50 ft on  1.5 - 2lpm  ?Cough: just white mucus  ?Sleeping: 45 degrees  ?SABA use: lots of hfa and neb despite symbicort  ?02: none at rest   ?  ?Postional posterior Cp x 5 months moves around in different patterns  "right now it's below the L scapula / non pleuritic, better with lidocaine already eval by Louanne Skye and told it was neuralgia  ? ? ?No obvious day to day or daytime variability or   purulent sputum or mucus plugs or hemoptysis or  chest tightness, subjective wheeze or overt sinus or hb symptoms.  ? ?Sleeping as above  without nocturnal  or early am exacerbation  of respiratory  c/o's or need for noct saba. Also denies any obvious fluctuation of symptoms with weather or environmental changes or other aggravating or alleviating factors except as outlined above  ? ?No unusual exposure hx or h/o childhood pna/ asthma or knowledge of premature birth. ? ?Current Allergies, Complete Past Medical History, Past Surgical History, Family History, and Social History were reviewed in [employer]  record. ? ?  ROS  The following are not active complaints unless bolded ?Hoarseness, sore throat, dysphagia, dental problems, itching, sneezing,  nasal congestion or discharge of excess mucus or purulent secretions, ear ache,   fever, chills, sweats, unintended wt loss or wt gain, classically pleuritic or exertional cp,  orthopnea pnd or arm/hand swelling  or leg swelling, presyncope, palpitations, abdominal pain, anorexia, nausea, vomiting, diarrhea  or change in bowel habits or change in bladder habits, change in stools or change in urine, dysuria, hematuria,  rash, arthralgias, visual complaints, headache, numbness, weakness or ataxia or problems with walking or coordination,  change in mood or  memory. ?      ? ?Current Meds  ?Medication Sig  ? acetaminophen (TYLENOL) 325 MG tablet Per bottle as needed  ? albuterol (VENTOLIN HFA) 108 (90 Base) MCG/ACT inhaler Inhale into the lungs.  ? ALPRAZolam (XANAX) 0.25 MG tablet Take 0.5-1 tablets by mouth 2 (two) times daily as needed.  ? amLODipine (NORVASC) 2.5 MG tablet TAKE 1 TABLET IF BLOOD PRESSURE(140-159), 2 TABLETS(160-179), 3 TABLETS FOR BLOOD PRESSURE GREATER THAN 180  ? atorvastatin (LIPITOR) 20 MG tablet Take 2 tablets (40 mg total) by mouth daily. NEEDS 20 MG TABLETS SECONDARY TO SWALLOWING ISSUES  ? budesonide-formoterol (SYMBICORT) 160-4.5 MCG/ACT inhaler Inhale 2 puffs into the lungs 2 (two) times daily.  ? calcium citrate (CALCITRATE - DOSED IN MG ELEMENTAL CALCIUM) 950 (200 Ca) MG tablet Take 1 tablet (200 mg of elemental calcium total) by mouth daily.  ? gabapentin (NEURONTIN) 100 MG capsule Take 1 capsule (100 mg total) by mouth at bedtime for 5 days, THEN 1 capsule (100 mg total) 2 (two) times daily.  ? guaiFENesin (MUCINEX) 600 MG 12 hr tablet Take 600 mg by mouth 2 (two) times daily as needed for cough or to loosen phlegm.  ? ipratropium-albuterol (DUONEB) 0.5-2.5 (3) MG/3ML SOLN Take 3 mLs by nebulization every 4 (four) hours as needed  (wheezing/shortness of breath). **PLAN C**  ? nitroGLYCERIN (NITROSTAT) 0.4 MG SL tablet ONE TABLET UNDER TONGUE AS NEEDED FOR CHEST PAIN EVERY 5 MINUTES FOR 3 DOSES  ? [facility]  ?Onychomycosis Nail

## 2021-08-30 NOTE — Patient Instructions (Addendum)
Zpak  ? ?Depomedrol 80 mg IM today ? ?Medrol 4 mg  x 4 until better then 2 daily x 5 daily , 1 daily for 5 days and stop (if not better after a week then stop it)  ? ? ?Pantoprazole (protonix) 40 mg   Take  30-60 min before first meal of the day and Pepcid (famotidine)  20 mg after supper until return to office - this is the best way to tell whether stomach acid is contributing to your problem.   ? ? ?Make sure you check your oxygen saturation  AT  your highest level of activity (not after you stop)   to be sure it stays over 90% and adjust  02 flow upward to maintain this level if needed but remember to turn it back to previous settings when you stop (to conserve your supply).  ? ? ?Please schedule a follow up visit in 3 months but call sooner if needed  ?

## 2021-08-31 ENCOUNTER — Encounter: Payer: Self-pay | Admitting: Internal Medicine

## 2021-08-31 NOTE — Assessment & Plan Note (Addendum)
Quit smoking 2009 ?Spirometry 02/15/2016  FEV1 0.60 (28%)  Ratio 38 with classic curvature  p am symbicort 160  ?- 01/08/2017    continue dulera 200 2bid  ?- 12/09/2017  After extensive coaching inhaler device  effectiveness =    90% with smi >add spiriva to  symbicort  ?- 12/12/2017 informed could not tol spiriva due to dry mouth - ?- 06/13/2018   continue symbicort 160 ?- 01/18/2020  After extensive coaching inhaler device,  effectiveness =  90%  ?- 06/06/2020 changed to breztri  ?Referred to pulmonary rehab 08/22/20  > has not heard from rehab as of 09/12/2020    ?- 03/20/21      alpha one AT phenotype  MM 161  ?Added flutter valve 05/11/2021  ?- 08/01/2021  After extensive coaching inhaler device,  effectiveness =    75% ? ? Group D (now reclassified as E) in terms of symptom/risk and laba/lama/ICS  therefore appropriate rx at this point >>>  breztri but pt prefers symbicort 160 and seems to benefit from short courses of medrol so rec continue symb 160/ depomedrol 80 and medrol taper  ? ?- The proper method of use, as well as anticipated side effects, of a metered-dose inhaler were discussed and demonstrated to the patient using teach back method.  ? ?Re SABA :  I spent extra time with pt today reviewing appropriate use of albuterol for prn use on exertion with the following points: ?1) saba is for relief of sob that does not improve by walking a slower pace or resting but rather if the pt does not improve after trying this first. ?2) If the pt is convinced, as many are, that saba helps recover from activity faster then it's easy to tell if this is the case by re-challenging : ie stop, take the inhaler, then p 5 minutes try the exact same activity (intensity of workload) that just caused the symptoms and see if they are substantially diminished or not after saba ?3) if there is an activity that reproducibly causes the symptoms, try the saba 15 min before the activity on alternate days  ? ?If in fact the saba really does help,  then fine to continue to use it prn but advised may need to look closer at the maintenance regimen being used to achieve better control of airways disease with exertion.  ?

## 2021-08-31 NOTE — Assessment & Plan Note (Addendum)
Rx 2lpm hs and prn daytime since 2014  ?-  06/13/2018   Walked on 2lpm x  3 laps @  approx 243ft each @ nl pace  stopped due to  Sob after each lap  s desat   ?- 05/11/2021   Walked on RA  x  1  lap(s) =  approx 250  ft  @ avg pace, stopped due to sob/ back pain with lowest 02 sats 95%   ? ?Advised: ?Make sure you check your oxygen saturation  AT  your highest level of activity (not after you stop)   to be sure it stays over 90% and adjust  02 flow upward to maintain this level if needed but remember to turn it back to previous settings when you stop (to conserve your supply).  ? ?F/u q 3 m, call sooner if needed ? ?    ?  ? ?Each maintenance medication was reviewed in detail including emphasizing most importantly the difference between maintenance and prns and under what circumstances the prns are to be triggered using an action plan format where appropriate. ? ?Total time for H and P, chart review, counseling, reviewing hfa/neb/02 device(s) and generating customized AVS unique to this office visit / same day charting = 32 min  ?     ? ? ? ?

## 2021-09-01 ENCOUNTER — Inpatient Hospital Stay (HOSPITAL_BASED_OUTPATIENT_CLINIC_OR_DEPARTMENT_OTHER): Payer: Medicare Other | Admitting: Oncology

## 2021-09-01 ENCOUNTER — Inpatient Hospital Stay: Payer: Medicare Other

## 2021-09-01 ENCOUNTER — Other Ambulatory Visit: Payer: Self-pay

## 2021-09-01 ENCOUNTER — Other Ambulatory Visit: Payer: Medicare Other

## 2021-09-01 ENCOUNTER — Encounter: Payer: Self-pay | Admitting: *Deleted

## 2021-09-01 ENCOUNTER — Ambulatory Visit: Payer: Self-pay

## 2021-09-01 ENCOUNTER — Ambulatory Visit: Payer: Self-pay | Admitting: Oncology

## 2021-09-01 VITALS — BP 164/88 | HR 90 | Temp 97.9°F | Resp 18 | Ht 63.0 in | Wt 88.4 lb

## 2021-09-01 DIAGNOSIS — C3412 Malignant neoplasm of upper lobe, left bronchus or lung: Secondary | ICD-10-CM

## 2021-09-01 DIAGNOSIS — Z5112 Encounter for antineoplastic immunotherapy: Secondary | ICD-10-CM

## 2021-09-01 LAB — CBC WITH DIFFERENTIAL (CANCER CENTER ONLY)
Abs Immature Granulocytes: 0 10*3/uL (ref 0.00–0.07)
Basophils Absolute: 0 10*3/uL (ref 0.0–0.1)
Basophils Relative: 0 %
Eosinophils Absolute: 0.1 10*3/uL (ref 0.0–0.5)
Eosinophils Relative: 1 %
HCT: 39.1 % (ref 36.0–46.0)
Hemoglobin: 12.7 g/dL (ref 12.0–15.0)
Immature Granulocytes: 0 %
Lymphocytes Relative: 19 %
Lymphs Abs: 0.7 10*3/uL (ref 0.7–4.0)
MCH: 29.5 pg (ref 26.0–34.0)
MCHC: 32.5 g/dL (ref 30.0–36.0)
MCV: 90.7 fL (ref 80.0–100.0)
Monocytes Absolute: 0.3 10*3/uL (ref 0.1–1.0)
Monocytes Relative: 9 %
Neutro Abs: 2.5 10*3/uL (ref 1.7–7.7)
Neutrophils Relative %: 71 %
Platelet Count: 213 10*3/uL (ref 150–400)
RBC: 4.31 MIL/uL (ref 3.87–5.11)
RDW: 13.7 % (ref 11.5–15.5)
WBC Count: 3.5 10*3/uL — ABNORMAL LOW (ref 4.0–10.5)
nRBC: 0 % (ref 0.0–0.2)

## 2021-09-01 LAB — CMP (CANCER CENTER ONLY)
ALT: 21 U/L (ref 0–44)
AST: 19 U/L (ref 15–41)
Albumin: 4.3 g/dL (ref 3.5–5.0)
Alkaline Phosphatase: 48 U/L (ref 38–126)
Anion gap: 8 (ref 5–15)
BUN: 13 mg/dL (ref 8–23)
CO2: 29 mmol/L (ref 22–32)
Calcium: 9.8 mg/dL (ref 8.9–10.3)
Chloride: 98 mmol/L (ref 98–111)
Creatinine: 0.43 mg/dL — ABNORMAL LOW (ref 0.44–1.00)
GFR, Estimated: >60 mL/min (ref >60)
Glucose, Bld: 126 mg/dL — ABNORMAL HIGH (ref 70–99)
Potassium: 3.7 mmol/L (ref 3.5–5.1)
Sodium: 135 mmol/L (ref 135–145)
Total Bilirubin: 0.7 mg/dL (ref 0.3–1.2)
Total Protein: 6.9 g/dL (ref 6.5–8.1)

## 2021-09-01 LAB — TSH: TSH: 1.842 u[IU]/mL (ref 0.350–4.500)

## 2021-09-01 MED ORDER — SODIUM CHLORIDE 0.9 % IV SOLN: Freq: Once | INTRAVENOUS | Status: AC

## 2021-09-01 MED ORDER — SODIUM CHLORIDE 0.9 % IV SOLN
200.0000 mg | Freq: Once | INTRAVENOUS | Status: AC
  Administered 2021-09-01: 200 mg via INTRAVENOUS
  Filled 2021-09-01: qty 8

## 2021-09-01 NOTE — Progress Notes (Signed)
Patient presents for treatment. RN assessment completed along with the following: ? ?Labs/vitals reviewed - Yes, and within treatment parameters.   ?Weight within 10% of previous measurement - Yes ?Informed consent completed and reflects current therapy/intent - Yes, on date 12/14/21             ?Provider progress note reviewed - Yes, today's provider note was reviewed. ?Treatment/Antibody/Supportive plan reviewed - Yes, and there are no adjustments needed for today's treatment. ?S&H and other orders reviewed - Yes, and there are no additional orders identified. ?Previous treatment date reviewed - Yes, and the appropriate amount of time has elapsed between treatments. ?Clinic Hand Off Received from - Cristy Friedlander, RN ? ?Patient to proceed with treatment.  ? ?

## 2021-09-01 NOTE — Progress Notes (Signed)
Patient seen by Dr. Sherrill today ? ?Vitals are within treatment parameters. ? ?Labs reviewed by Dr. Sherrill and are within treatment parameters. ? ?Per physician team, patient is ready for treatment and there are NO modifications to the treatment plan.  ?

## 2021-09-01 NOTE — Patient Instructions (Signed)
[facility]   ?Discharge Instructions: ?Thank you for choosing [facility] to provide your oncology and hematology care.  ? ?If you have a lab appointment with the [facility], please go directly to the [facility] and check in at the registration area. ?  ?Wear comfortable clothing and clothing appropriate for easy access to any Portacath or PICC line.  ? ?We strive to give you quality time with your provider. You may need to reschedule your appointment if you arrive late (15 or more minutes).  Arriving late affects you and other patients whose appointments are after yours.  Also, if you miss three or more appointments without notifying the office, you may be dismissed from the clinic at the provider?s discretion.    ?  ?For prescription refill requests, have your pharmacy contact our office and allow 72 hours for refills to be completed.   ? ?Today you received the following chemotherapy and/or immunotherapy agents Keytruda    ?  ?To help prevent nausea and vomiting after your treatment, we encourage you to take your nausea medication as directed. ? ?BELOW ARE SYMPTOMS THAT SHOULD BE REPORTED IMMEDIATELY: ?*FEVER GREATER THAN 100.4 F (38 ?C) OR HIGHER ?*CHILLS OR SWEATING ?*NAUSEA AND VOMITING THAT IS NOT CONTROLLED WITH YOUR NAUSEA MEDICATION ?*UNUSUAL SHORTNESS OF BREATH ?*UNUSUAL BRUISING OR BLEEDING ?*URINARY PROBLEMS (pain or burning when urinating, or frequent urination) ?*BOWEL PROBLEMS (unusual diarrhea, constipation, pain near the anus) ?TENDERNESS IN MOUTH AND THROAT WITH OR WITHOUT PRESENCE OF ULCERS (sore throat, sores in mouth, or a toothache) ?UNUSUAL RASH, SWELLING OR PAIN  ?UNUSUAL VAGINAL DISCHARGE OR ITCHING  ? ?Items with * indicate a potential emergency and should be followed up as soon as possible or go to the Emergency Department if any problems should occur. ? ?Please show the CHEMOTHERAPY ALERT CARD or IMMUNOTHERAPY ALERT CARD at check-in to the  Emergency Department and triage nurse. ? ?Should you have questions after your visit or need to cancel or reschedule your appointment, please contact [facility]  Dept: [phone]  and follow the prompts.  Office hours are 8:00 a.m. to 4:30 p.m. Monday - Friday. Please note that voicemails left after 4:00 p.m. may not be returned until the following business day.  We are closed weekends and major holidays. You have access to a nurse at all times for urgent questions. Please call the main number to the clinic Dept: [phone] and follow the prompts. ? ? ?For any non-urgent questions, you may also contact your provider using MyChart. We now offer e-Visits for anyone 62 and older to request care online for non-urgent symptoms. For details visit [email or web address]. ?  ?Also download the MyChart app! Go to the app store, search "MyChart", open the app, select [facility], and log in with your MyChart username and password. ? ?Due to Covid, a mask is required upon entering the hospital/clinic. If you do not have a mask, one will be given to you upon arrival. For doctor visits, patients may have 1 support person aged 37 or older with them. For treatment visits, patients cannot have anyone with them due to current Covid guidelines and our immunocompromised population.  ? ?Pembrolizumab injection ?What is this medication? ?PEMBROLIZUMAB (pem broe liz ue mab) is a monoclonal antibody. It is used to treat certain types of cancer. ?This medicine may be used for other purposes; ask your health care provider or pharmacist if you have questions. ?COMMON BRAND NAME(S): Keytruda ?  What should I tell my care team before I take this medication? ?They need to know if you have any of these conditions: ?autoimmune diseases like Crohn's disease, ulcerative colitis, or lupus ?have had or planning to have an allogeneic stem cell transplant (uses someone else's stem cells) ?history of organ  transplant ?history of chest radiation ?nervous system problems like myasthenia gravis or Guillain-Barre syndrome ?an unusual or allergic reaction to pembrolizumab, other medicines, foods, dyes, or preservatives ?pregnant or trying to get pregnant ?breast-feeding ?How should I use this medication? ?This medicine is for infusion into a vein. It is given by a health care professional in a hospital or clinic setting. ?A special MedGuide will be given to you before each treatment. Be sure to read this information carefully each time. ?Talk to your pediatrician regarding the use of this medicine in children. While this drug may be prescribed for children as young as 6 months for selected conditions, precautions do apply. ?Overdosage: If you think you have taken too much of this medicine contact a poison control center or emergency room at once. ?NOTE: This medicine is only for you. Do not share this medicine with others. ?What if I miss a dose? ?It is important not to miss your dose. Call your doctor or health care professional if you are unable to keep an appointment. ?What may interact with this medication? ?Interactions have not been studied. ?This list may not describe all possible interactions. Give your health care provider a list of all the medicines, herbs, non-prescription drugs, or dietary supplements you use. Also tell them if you smoke, drink alcohol, or use illegal drugs. Some items may interact with your medicine. ?What should I watch for while using this medication? ?Your condition will be monitored carefully while you are receiving this medicine. ?You may need blood work done while you are taking this medicine. ?Do not become pregnant while taking this medicine or for 4 months after stopping it. Women should inform their doctor if they wish to become pregnant or think they might be pregnant. There is a potential for serious side effects to an unborn child. Talk to your health care professional or  pharmacist for more information. Do not breast-feed an infant while taking this medicine or for 4 months after the last dose. ?What side effects may I notice from receiving this medication? ?Side effects that you should report to your doctor or health care professional as soon as possible: ?allergic reactions like skin rash, itching or hives, swelling of the face, lips, or tongue ?bloody or black, tarry ?breathing problems ?changes in vision ?chest pain ?chills ?confusion ?constipation ?cough ?diarrhea ?dizziness or feeling faint or lightheaded ?fast or irregular heartbeat ?fever ?flushing ?joint pain ?low blood counts - this medicine may decrease the number of white blood cells, red blood cells and platelets. You may be at increased risk for infections and bleeding. ?muscle pain ?muscle weakness ?pain, tingling, numbness in the hands or feet ?persistent headache ?redness, blistering, peeling or loosening of the skin, including inside the mouth ?signs and symptoms of high blood sugar such as dizziness; dry mouth; dry skin; fruity breath; nausea; stomach pain; increased hunger or thirst; increased urination ?signs and symptoms of kidney injury like trouble passing urine or change in the amount of urine ?signs and symptoms of liver injury like dark urine, light-colored stools, loss of appetite, nausea, right upper belly pain, yellowing of the eyes or skin ?sweating ?swollen lymph nodes ?weight loss ?Side effects that usually do not require  medical attention (report to your doctor or health care professional if they continue or are bothersome): ?decreased appetite ?hair loss ?tiredness ?This list may not describe all possible side effects. Call your doctor for medical advice about side effects. You may report side effects to FDA at 1-800-FDA-1088. ?Where should I keep my medication? ?This drug is given in a hospital or clinic and will not be stored at home. ?NOTE: This sheet is a summary. It may not cover all possible  information. If you have questions about this medicine, talk to your doctor, pharmacist, or health care provider. ?? 2023 Elsevier/Gold Standard (2021-03-24 00:00:00) ? ?

## 2021-09-01 NOTE — Progress Notes (Signed)
?[facility] ?OFFICE PROGRESS NOTE ? ? ?Diagnosis: Non-small cell lung cancer ? ?INTERVAL HISTORY:  ? ?Brooke [name] completed another cycle of pembrolizumab 08/11/2021.  No rash or diarrhea.  She reports recent increase in dyspnea.  She saw Dr. Melvyn Novas 08/30/2021.  She was given a Solu-Medrol injection and prescribed a Medrol taper.  She feels better.  She reports a good appetite. ?She saw Dr. Flavia Shipper for back pain.  She does not have significant pain at present.  She was prescribed gabapentin but has not taken this. ? ?Objective: ? ?Vital signs in last 24 hours: ? ?Blood pressure (!) 164/88, pulse 90, temperature 97.9 ?F (36.6 ?C), temperature source Oral, resp. rate 18, height 5' 3" (1.6 m), weight 88 lb 6.4 oz (40.1 kg), SpO2 98 %. ?  ? ?HEENT: No thrush ?Resp: Bronchial sounds at the left upper chest, no respiratory distress ?Cardio: Regular rate and rhythm ?GI: No hepatosplenomegaly ?Vascular: No leg edema  ?Skin: No rash ? ?Portacath/PICC-without erythema ? ?Lab Results: ? ?Lab Results  ?Component Value Date  ? WBC 3.5 (L) 09/01/2021  ? HGB 12.7 09/01/2021  ? HCT 39.1 09/01/2021  ? MCV 90.7 09/01/2021  ? PLT 213 09/01/2021  ? NEUTROABS 2.5 09/01/2021  ? ? ?CMP  ?Lab Results  ?Component Value Date  ? NA 135 09/01/2021  ? K 3.7 09/01/2021  ? CL 98 09/01/2021  ? CO2 29 09/01/2021  ? GLUCOSE 126 (H) 09/01/2021  ? BUN 13 09/01/2021  ? CREATININE 0.43 (L) 09/01/2021  ? CALCIUM 9.8 09/01/2021  ? PROT 6.9 09/01/2021  ? ALBUMIN 4.3 09/01/2021  ? AST 19 09/01/2021  ? ALT 21 09/01/2021  ? ALKPHOS 48 09/01/2021  ? BILITOT 0.7 09/01/2021  ? GFRNONAA >60 09/01/2021  ? GFRAA >60 02/05/2020  ? ? ? ? ?Imaging: ? ?CT Chest W Contrast ? ?Result Date: 08/29/2021 ?CLINICAL DATA:  Metastatic lung cancer restaging * Tracking Code: BO * EXAM: CT CHEST WITH CONTRAST TECHNIQUE: Multidetector CT imaging of the chest was performed during intravenous contrast administration. RADIATION DOSE REDUCTION: This exam was performed according  to the departmental dose-optimization program which includes automated exposure control, adjustment of the mA and/or kV according to patient size and/or use of iterative reconstruction technique. CONTRAST:  62m OMNIPAQUE IOHEXOL 300 MG/ML  SOLN COMPARISON:  05/17/2021 FINDINGS: Cardiovascular: Aortic atherosclerosis. Normal heart size. Three-vessel coronary artery calcifications. Small pericardial effusion. Mediastinum/Nodes: No enlarged mediastinal, hilar, or axillary lymph nodes. Thyroid gland, trachea, and esophagus demonstrate no significant findings. Lungs/Pleura: Severe emphysema. Unchanged post treatment appearance of the left upper lobe with extensive post treatment/post radiation consolidation, fibrosis, and volume loss of the left apex. No pleural effusion or pneumothorax. Upper Abdomen: No acute abnormality.  Unchanged small liver cysts. Musculoskeletal: No chest wall abnormality. No suspicious osseous lesions identified. Multiple wedge deformities of the thoracic spine; including unchanged superior endplate wedge deformities of T5 and T8 with approximately 30 and 40% anterior height loss respectively, with a new superior endplate wedge deformity of T11 proximally 30% anterior height loss (series 7, image 76). IMPRESSION: 1. Unchanged post treatment appearance of the left upper lobe with extensive post treatment/post radiation consolidation, fibrosis, and volume loss of the left apex. No evidence of malignant recurrence or metastatic disease in the chest. 2. Severe emphysema. 3. Multiple superior endplate wedge deformities of the thoracic spine; unchanged wedge deformities of T5 and T8, new wedge deformity of T11. 4. Coronary artery disease. Aortic Atherosclerosis (ICD10-I70.0) and Emphysema (ICD10-J43.9). Electronically Signed   By:  Alex D Bibbey M.D.   On: 08/29/2021 12:04   ? ?Medications: I have reviewed the patient's current medications. ? ? ?Assessment/Plan: ?Left lung mass ?PET scan  02/28/2016-hypermetabolic left upper lobe mass, hypermetabolic adjacent nodule, hypermetabolic AP window node ?CT chest 10/28/2016-enlarging left upper lobe mass, increased AP window lymphadenopathy, new spleen metastasis, new adenopathy at the pancreas tail, upper abdomen, and middle mediastinum ?CT-guided biopsy of the left lung mass on 11/01/2016, Non-small cell carcinoma most consistent with squamous cell carcinoma ?PDL1 60% ?Left lung radiation 11/08/2016 through 11/21/2016 ?CT chest 12/12/2016-reexpansion of left upper lobe with a decreased left upper lobe mass, unchanged mediastinal adenopathy, progression of a splenic metastasis/pancreatic tail/gastrohepatic ligament metastasis, right lower lobe pneumonia ?Cycle 1 Pembrolizumab 12/14/2016 ?Cycle 2 Pembrolizumab 01/03/2017 ?Cycle 3 Pembrolizumab 01/24/2017 ?Cycle 4 Pembrolizumab 02/12/2017 ?CT 03/05/2018-decrease in left upper lobe mass, mediastinal adenopathy, and splenic mass ?Cycle 5 pembrolizumab 03/07/2017 ?Cycle 6 Pembrolizumab 04/02/2017 ?Cycle 7 pembrolizumab 04/25/2017 ?Cycle 8 Pembrolizumab 05/14/2017 ?Cycle 9 Pembrolizumab 06/06/2017 ?CT 06/20/2017-mild decrease in left upper lobe mass, mild increase in paramediastinal left upper lobe nodule-radiation change?, decreased splenic metastasis ?Cycle 10 pembrolizumab 06/25/2017 ?Cycle 11 pembrolizumab 07/18/2017 ?Cycle 12 pembrolizumab 08/29/2017 ?Cycle 13 pembrolizumab 09/17/2017 ?Cycle 14 Pembrolizumab 10/10/2017 ?CT chest 10/24/2017- slight enlargement of small mediastinal lymph nodes, increased left upper lobe consolidation, decreased size of spleen lesion ?Cycle 15 pembrolizumab 10/29/2017 ?Cycle 16 Pembrolizumab 11/21/2017 ?Cycle 17 Pembrolizumab 12/11/2017 ?Cycle 18 pembrolizumab 01/02/2018 ?Cycle 19 Pembrolizumab 01/21/2018 ?Cycle 20 Pembrolizumab 02/13/2018 ?CT chest 02/27/2018- decreased left paratracheal node, progressive consolidation/fibrosis in the left upper lung, decreased size of splenic mass ?Cycle 21  pembrolizumab 03/04/2018 ?Cycle 22 Pembrolizumab 03/27/2018  ?Cycle 23 pembrolizumab 04/15/2018 ?Cycle 24 pembrolizumab 05/08/2018 ?Cycle 25 Pembrolizumab 05/27/2018 ?CT chest 06/16/2018- stable left upper lung radiation fibrosis with no evidence of local tumor recurrence.  Decreased left paratracheal adenopathy.  No new or progressive metastatic disease in the chest.  Gastrohepatic ligament lymphadenopathy stable.  Splenic metastasis decreased.  T5 vertebral compression fracture with associated patchy sclerosis and no discrete osseous lesion, new in the interval. ?Cycle 26 Pembrolizumab 06/19/2018 ?Cycle 27 pembrolizumab 07/08/2018 ?Cycle 28 pembrolizumab 07/31/2018 ?Cycle 29 pembrolizumab 08/19/2018 ?Cycle 30 Pembrolizumab 09/11/2018 ?Cycle 31 pembrolizumab 09/30/2018 ?Cycle 32 pembrolizumab 10/23/2018 ?CT chest 10/28/2018- left apical pleural-parenchymal opacity consistent with radiation scarring has become more confluent likely representing evolutionary change.  Continued further decrease in size of index left paratracheal node and splenic metastasis.  Gastrohepatic ligament lymph node not changed. ?Cycle 33 Pembrolizumab 11/11/2018 ?Cycle 34 pembrolizumab 12/04/2018 ?Cycle 35 pembrolizumab 12/23/2018 ?CT chest 02/12/2019- posttreatment changes left upper lobe unchanged.  Continued decrease in size of splenic metastasis.  Stable appearance of left paratracheal lymph nodes.  Stable gastrohepatic adenopathy.  Incomplete visualization of retroperitoneal lymph nodes in the upper abdomen. ?CT chest 07/02/2019-stable post treatment related changes left lung.  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  Slight regression of enlarged likely metastatic gastrohepatic lymph node. ?CT chest 11/11/2019-interval enlargement of a right paratracheal lymph node measuring 14 mm, increased from 5 mm.  No new lung mass or nodularity.  New adenopathy in the upper abdomen.  2.7 cm lesion gastrohepatic ligament.  Necrotic node at the splenic hilum measures  4.7 cm.  Large node along the IVC left upper quadrant measures 2.7 cm.  Similar node left adjacent the aorta measures 1.8 cm.  Enhancing lesion in the spleen is unchanged.  Several low-density lesions in the liver a

## 2021-09-05 ENCOUNTER — Other Ambulatory Visit: Payer: Self-pay | Admitting: Specialist

## 2021-09-08 ENCOUNTER — Other Ambulatory Visit: Payer: Self-pay | Admitting: *Deleted

## 2021-09-08 DIAGNOSIS — C3412 Malignant neoplasm of upper lobe, left bronchus or lung: Secondary | ICD-10-CM

## 2021-09-13 ENCOUNTER — Encounter (HOSPITAL_BASED_OUTPATIENT_CLINIC_OR_DEPARTMENT_OTHER): Payer: Self-pay | Admitting: Cardiovascular Disease

## 2021-09-13 ENCOUNTER — Ambulatory Visit (INDEPENDENT_AMBULATORY_CARE_PROVIDER_SITE_OTHER): Payer: Medicare Other | Admitting: Cardiovascular Disease

## 2021-09-13 VITALS — BP 138/100 | HR 86 | Ht 63.0 in | Wt 83.2 lb

## 2021-09-13 DIAGNOSIS — I2510 Atherosclerotic heart disease of native coronary artery without angina pectoris: Secondary | ICD-10-CM

## 2021-09-13 DIAGNOSIS — Z5181 Encounter for therapeutic drug level monitoring: Secondary | ICD-10-CM | POA: Diagnosis not present

## 2021-09-13 DIAGNOSIS — E7800 Pure hypercholesterolemia, unspecified: Secondary | ICD-10-CM | POA: Diagnosis not present

## 2021-09-13 DIAGNOSIS — R0989 Other specified symptoms and signs involving the circulatory and respiratory systems: Secondary | ICD-10-CM | POA: Diagnosis not present

## 2021-09-13 NOTE — Progress Notes (Signed)
? ?Cardiology Office Note  ? ?Date:  09/13/2021  ? ?ID:  Brooke [name], DOB [birth year], MRN [id number] ? ?PCP:  [name], MD  ?Cardiologist:  Skeet Latch, MD  ?Electrophysiologist:  None  ? ?Evaluation Performed:  Follow-Up Visit ? ?Chief Complaint: Hypertension ? ?History of Present Illness:   ? ?Brooke [name] is a 76 y.o. female with COPD on home O2, nonobstructive CAD, hyperlipidemia, stage IV squamous cell lung cancer, radiation fibrosis, GERD, and prior tobacco abuse here for follow-up.  She was initially seen by cardiology 06/2013 when she had an NSTEMI.  Heart cath at that time revealed minimal, non-obstructive CAD.  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  LVEF was 60%.  It was thought that her symptoms were attributable to spasm.  She takes atorvastatin every other day to avoid myalgias.  Amlodipine was started for spasms.  She was diagnosed with lung cancer in 2017.  A bronchoscopy was recommended but she declined at that time.  In 10/2016 she developed progressive symptoms with metastatic disease.  She completed 35 cycles of chemotherapy with pembrolizumab.  This helped her metastatic disease but not her primary mass.  She was subsequently restarted on chemotherapy with pembrolizumab. ? ?Ms. Ishaq saw [place], [place], on 12/2019 with elevated blood pressure.  He recommended watchful waiting at that time.  It was noted that anxiety tended to trigger her blood pressure.  At her last appointment blood pressure was averaging 130s/70s without any antihypertensives.  She had chest pain that she attributed to phlegm because it resolved with guaifenesin.  She has been struggling with her weight.  She is losing despite having a good appetite.  Some days she is tired and doesn't eat as much but she tries to drink supplements as well.  She just finished a course of antibiotics for an upper respiratory infection.  Her breathing has improved and she has less productive  cough.  She has intermittent back pain attributable to her compression fractures.  She is also been struggling with constipation.  BP at home has been mostly controlled.  It is typically in the 130s-140s/80.  She rarely has to take amlodipine and almost never has to take a second.  ? ?Past Medical History:  ?Diagnosis Date  ? CAD in native artery 03/16/2021  ? Chronic respiratory failure ([place])   ? a. on home O2.  ? COPD (chronic obstructive pulmonary disease) ([place])   ? a. Home O2.  ? Former tobacco use   ? GERD (gastroesophageal reflux disease)   ? Hyperlipidemia   ? Labile hypertension 05/27/2020  ? Liver metastases   ? lung ca 10/2016  ? 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 NSTEMI (non-ST elevated myocardial infarction) ([place])   ? a. 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 Coronary vasospasm  ? ?Past Surgical History:  ?Procedure Laterality Date  ? LEFT HEART CATHETERIZATION WITH CORONARY ANGIOGRAM N/A 06/08/2013  ? Procedure: LEFT HEART CATHETERIZATION WITH CORONARY ANGIOGRAM;  Surgeon: Sanda Klein, MD;  Location: [facility] CATH LAB;  Service: Cardiovascular;  Laterality: N/A;  ?  ? ?Current Meds  ?Medication Sig  ? acetaminophen (TYLENOL) 325 MG tablet Per bottle as needed  ? albuterol (VENTOLIN HFA) 108 (90 Base) MCG/ACT inhaler Inhale into the lungs.  ? ALPRAZolam (XANAX) 0.25 MG tablet Take 0.5-1 tablets by mouth 2 (two) times daily as needed.  ?  amLODipine (NORVASC) 2.5 MG tablet TAKE 1 TABLET IF BLOOD PRESSURE(140-159), 2 TABLETS(160-179), 3 TABLETS FOR BLOOD PRESSURE GREATER THAN 180  ? atorvastatin (LIPITOR) 20 MG tablet Take 2 tablets (40 mg total) by mouth daily. NEEDS 20 MG TABLETS SECONDARY TO SWALLOWING ISSUES  ? budesonide-formoterol (SYMBICORT) 160-4.5 MCG/ACT inhaler Inhale 2 puffs into the lungs 2 (two) times daily.  ? calcium citrate (CALCITRATE - DOSED IN MG ELEMENTAL CALCIUM) 950 (200 Ca) MG tablet Take 1 tablet  (200 mg of elemental calcium total) by mouth daily.  ? gabapentin (NEURONTIN) 100 MG capsule Take 1 capsule (100 mg total) by mouth 2 (two) times daily.  ? guaiFENesin (MUCINEX) 600 MG 12 hr tablet Take 600 mg by mouth 2 (two) times daily as needed for cough or to loosen phlegm.  ? ipratropium-albuterol (DUONEB) 0.5-2.5 (3) MG/3ML SOLN Take 3 mLs by nebulization every 4 (four) hours as needed (wheezing/shortness of breath). **PLAN C**  ? nitroGLYCERIN (NITROSTAT) 0.4 MG SL tablet ONE TABLET UNDER TONGUE AS NEEDED FOR CHEST PAIN EVERY 5 MINUTES FOR 3 DOSES  ? [place]  ?Onychomycosis Nail Lacquer -  ?Fluconazole 2%, Terbinafine 1% DMSO/undecylenic acid 25% ?Apply to affected nail once daily ?Qty. 120 gm ?3 refills  ? OVER THE COUNTER MEDICATION Total Beets1 tablet daily.  ? OVER THE COUNTER MEDICATION Aspercream as needed  ? OXYGEN 2lpm 24/7  ? pantoprazole (PROTONIX) 40 MG tablet Take 1 tablet (40 mg total) by mouth daily. Take 30-60 min before first meal of the day  ? Probiotic Product (PROBIOTIC ADVANCED PO) Take 1 tablet by mouth daily.  ? Simethicone 180 MG CAPS Use as needed  ?  ? ?Allergies:   Afrin [oxymetazoline], Pulmicort [budesonide], Sulfa antibiotics, Nitrofuran derivatives, Codeine, Imdur [isosorbide dinitrate], and Prednisone  ? ?Social History  ? ?Tobacco Use  ? Smoking status: Former  ?  Packs/day: 1.00  ?  Years: 30.00  ?  Pack years: 30.00  ?  Types: Cigarettes  ?  Quit date: 05/08/2007  ?  Years since quitting: 14.3  ? Smokeless tobacco: Never  ?Vaping Use  ? Vaping Use: Never used  ?Substance Use Topics  ? Alcohol use: Yes  ?  Comment: rarely  ? Drug use: No  ?  ? ?Family Hx: ?The patient's family history includes Lung cancer in her father. There is no history of Heart attack, Stroke, or Hypertension. ? ?ROS:   ?Please see the history of present illness.    ?All other systems reviewed and are negative. ? ? ?Prior CV studies:   ?The following studies were reviewed  today: ? ?Echo 06/06/2013: ?Study Conclusions  ? ?Left ventricle: The cavity size was normal. Systolic  ?function was normal. The estimated ejection fraction was in  ?the range of 50% to 55%. Regional wall motion abnormalities  ?cannot be excluded. Doppler parameters are consistent with  ?abnormal left ventricular relaxation (grade 1 diastolic  ?dysfunction).       ? ?Labs/Other Tests and Data Reviewed:   ? ?EKG:  An ECG dated 12/22/19 was personally reviewed today and demonstrated:  sinus rhythm.  Rate 72 bpm.  Cannot rule out prior septal infarct. ?09/13/21: Sinus rhythm.  Rate 86 bpm. ? ?Recent Labs: ?01/12/2021: B Natriuretic Peptide 62.9 ?09/01/2021: ALT 21; BUN 13; Creatinine 0.43; Hemoglobin 12.7; Platelet Count 213; Potassium 3.7; Sodium 135; TSH 1.842  ? ?Recent Lipid Panel ?Lab Results  ?Component Value Date/Time  ? CHOL 220 (H) 09/05/2020 12:17 PM  ? TRIG 47 09/05/2020 12:17  PM  ? HDL 102 09/05/2020 12:17 PM  ? CHOLHDL 2.2 09/05/2020 12:17 PM  ? CHOLHDL 2.2 08/18/2015 10:41 AM  ? LDLCALC 110 (H) 09/05/2020 12:17 PM  ? ? ?Wt Readings from Last 3 Encounters:  ?09/13/21 83 lb 3.2 oz (37.7 kg)  ?09/01/21 88 lb 6.4 oz (40.1 kg)  ?08/30/21 89 lb 3.2 oz (40.5 kg)  ?  ? ?Risk Assessment/Calculations:   ?  ? ?Objective:   ? ?VS:  BP (!) 138/100 (BP Location: Right Arm, Patient Position: Sitting, Cuff Size: Normal)   Pulse 86   Ht 5\' 3"  (1.6 m)   Wt 83 lb 3.2 oz (37.7 kg)   BMI 14.74 kg/m?  , BMI Body mass index is 14.74 kg/m?. ?GENERAL:  Frail, elderly woman in no acute distress.  Hoarse ?HEENT: Pupils equal round and reactive, fundi not visualized, oral mucosa unremarkable ?NECK:  No jugular venous distention, waveform within normal limits, carotid upstroke brisk and symmetric, no bruit.  +nasal cannula ?LUNGS:  Clear to auscultation bilaterally ?HEART:  RRR.  PMI not displaced or sustained,S1 and S2 within normal limits, no S3, no S4, no clicks, no rubs, no murmurs ?ABD:  Flat, positive bowel sounds normal in  frequency in pitch, no bruits, no rebound, no guarding, no midline pulsatile mass, no hepatomegaly, no splenomegaly ?EXT:  2 plus pulses throughout, no edema, no cyanosis no clubbing ?SKIN:  No rashes no nodules ?NEURO:  [place]

## 2021-09-13 NOTE — Assessment & Plan Note (Signed)
She has nonobstructive CAD as well as myocardial bridging.  Continue atorvastatin and amlodipine.  She will get fasting lipids checked at her next infusion appointment.  She has been given a lab slip.  She remembers to go fasting. ?

## 2021-09-13 NOTE — Patient Instructions (Addendum)
Medication Instructions:  ?Your physician recommends that you continue on your current medications as directed. Please refer to the Current Medication list given to you today.  ? ?*If you need a refill on your cardiac medications before your next appointment, please call your pharmacy* ? ?Lab Work: ?FASTING LP/CMET SOON  ? ?If you have labs (blood work) drawn today and your tests are completely normal, you will receive your results only by: ?MyChart Message (if you have MyChart) OR ?A paper copy in the mail ?If you have any lab test that is abnormal or we need to change your treatment, we will call you to review the results. ? ?Testing/Procedures: ?NONE  ? ?Follow-Up: ?At [facility], you and your health needs are our priority.  As part of our continuing mission to provide you with exceptional heart care, we have created designated Provider Care Teams.  These Care Teams include your primary Cardiologist (physician) and Advanced Practice Providers (APPs -  Physician Assistants and Nurse Practitioners) who all work together to provide you with the care you need, when you need it. ? ?We recommend signing up for the patient portal called "MyChart".  Sign up information is provided on this After Visit Summary.  MyChart is used to connect with patients for Virtual Visits (Telemedicine).  Patients are able to view lab/test results, encounter notes, upcoming appointments, etc.  Non-urgent messages can be sent to your provider as well.   ?To learn more about what you can do with MyChart, go to [email or web address].   ? ?Your next appointment:   ?6 month(s) ? ?The format for your next appointment:   ?In Person ? ?Provider:   ?Skeet Latch, MD  ? ?AMY OUR CARE GUIDE WILL REACH OUT TO YOU FOR STRESS MANAGEMENT  ? ? ? ? ?

## 2021-09-13 NOTE — Assessment & Plan Note (Addendum)
Her blood pressure continues to be very labile.  It is triggered by her anxiety.  She previously psychiatrist and this was not very helpful.  We will have our care guide reach out to her to see if she can help with stress management techniques.  Encouraged her to continue with her exercise, reading, and meditation.  Continue amlodipine.  She takes 2.5 mg if her blood pressure is 140-159 and 2 tablets for 160-179 with 3 tablets for >180.  She very rarely needs to take more than 1 tablet and often does not need to take any medication for her blood pressure. ?

## 2021-09-17 ENCOUNTER — Other Ambulatory Visit: Payer: Self-pay | Admitting: Oncology

## 2021-09-22 ENCOUNTER — Ambulatory Visit: Payer: Self-pay | Admitting: *Deleted

## 2021-09-22 ENCOUNTER — Other Ambulatory Visit: Payer: Self-pay

## 2021-09-22 ENCOUNTER — Ambulatory Visit: Payer: Self-pay

## 2021-09-22 ENCOUNTER — Encounter: Payer: Self-pay | Admitting: Nurse Practitioner

## 2021-09-22 ENCOUNTER — Ambulatory Visit: Payer: Self-pay | Admitting: Oncology

## 2021-09-22 ENCOUNTER — Inpatient Hospital Stay (HOSPITAL_BASED_OUTPATIENT_CLINIC_OR_DEPARTMENT_OTHER): Payer: Medicare Other | Admitting: Nurse Practitioner

## 2021-09-22 ENCOUNTER — Inpatient Hospital Stay: Payer: Medicare Other

## 2021-09-22 ENCOUNTER — Inpatient Hospital Stay: Payer: Medicare Other | Attending: Oncology

## 2021-09-22 VITALS — BP 156/80 | HR 83 | Temp 98.1°F | Resp 18 | Ht 63.0 in | Wt 85.8 lb

## 2021-09-22 DIAGNOSIS — Z9981 Dependence on supplemental oxygen: Secondary | ICD-10-CM | POA: Diagnosis not present

## 2021-09-22 DIAGNOSIS — Z79899 Other long term (current) drug therapy: Secondary | ICD-10-CM | POA: Insufficient documentation

## 2021-09-22 DIAGNOSIS — C7889 Secondary malignant neoplasm of other digestive organs: Secondary | ICD-10-CM | POA: Insufficient documentation

## 2021-09-22 DIAGNOSIS — C3412 Malignant neoplasm of upper lobe, left bronchus or lung: Secondary | ICD-10-CM | POA: Diagnosis not present

## 2021-09-22 DIAGNOSIS — I252 Old myocardial infarction: Secondary | ICD-10-CM | POA: Diagnosis not present

## 2021-09-22 DIAGNOSIS — Z5112 Encounter for antineoplastic immunotherapy: Secondary | ICD-10-CM | POA: Diagnosis not present

## 2021-09-22 DIAGNOSIS — Z86711 Personal history of pulmonary embolism: Secondary | ICD-10-CM | POA: Insufficient documentation

## 2021-09-22 DIAGNOSIS — J440 Chronic obstructive pulmonary disease with acute lower respiratory infection: Secondary | ICD-10-CM | POA: Insufficient documentation

## 2021-09-22 LAB — CMP (CANCER CENTER ONLY)
ALT: 21 U/L (ref 0–44)
AST: 19 U/L (ref 15–41)
Albumin: 4.1 g/dL (ref 3.5–5.0)
Alkaline Phosphatase: 50 U/L (ref 38–126)
Anion gap: 7 (ref 5–15)
BUN: 13 mg/dL (ref 8–23)
CO2: 29 mmol/L (ref 22–32)
Calcium: 9.2 mg/dL (ref 8.9–10.3)
Chloride: 100 mmol/L (ref 98–111)
Creatinine: 0.48 mg/dL (ref 0.44–1.00)
GFR, Estimated: >60 mL/min (ref >60)
Glucose, Bld: 92 mg/dL (ref 70–99)
Potassium: 4 mmol/L (ref 3.5–5.1)
Sodium: 136 mmol/L (ref 135–145)
Total Bilirubin: 0.6 mg/dL (ref 0.3–1.2)
Total Protein: 6.7 g/dL (ref 6.5–8.1)

## 2021-09-22 MED ORDER — SODIUM CHLORIDE 0.9 % IV SOLN: Freq: Once | INTRAVENOUS | Status: AC

## 2021-09-22 MED ORDER — SODIUM CHLORIDE 0.9 % IV SOLN
200.0000 mg | Freq: Once | INTRAVENOUS | Status: AC
  Administered 2021-09-22: 200 mg via INTRAVENOUS
  Filled 2021-09-22: qty 8

## 2021-09-22 NOTE — Progress Notes (Signed)
Patient seen by [name] NP today  Vitals are within treatment parameters.  Labs reviewed by [name] NP     Per physician team, patient is ready for treatment and there are NO modifications to the treatment plan.

## 2021-09-22 NOTE — Progress Notes (Signed)
Patient presents for treatment. RN assessment completed along with the following:  Labs/vitals reviewed - Yes, and within treatment parameters.   Weight within 10% of previous measurement - Yes Informed consent completed and reflects current therapy/intent - Yes, on date 12/22/2016             Provider progress note reviewed - Yes, today's provider note was reviewed. Treatment/Antibody/Supportive plan reviewed - Yes, and there are no adjustments needed for today's treatment. S&H and other orders reviewed - Yes, and there are no additional orders identified. Previous treatment date reviewed - Yes, and the appropriate amount of time has elapsed between treatments. Clinic Hand Off Received from - Berneta Sages, RN   Patient to proceed with treatment.   Per [name], NP ok to proceed with CMP only today.

## 2021-09-22 NOTE — Progress Notes (Signed)
[facility] OFFICE PROGRESS NOTE   Diagnosis: Non-small cell lung cancer  INTERVAL HISTORY:   Brooke [name] returns as scheduled.  She completed another cycle of Pembrolizumab 09/01/2021.  No rash or diarrhea.  She describes breathing as "a little better".  Appetite varies.  She is trying to drink nutritional supplements.  Objective:  Vital signs in last 24 hours:  Blood pressure (!) 156/80, pulse 83, temperature 98.1 F (36.7 C), temperature source Oral, resp. rate 18, height '5\' 3"'  (1.6 m), weight 85 lb 12.8 oz (38.9 kg), SpO2 100 %.    Resp: Distant breath sounds.  No respiratory distress. Cardio: Regular rate and rhythm. GI: No hepatosplenomegaly. Vascular: No leg edema. Neuro: Alert and oriented. Skin: No rash.   Lab Results:  Lab Results  Component Value Date   WBC 3.5 (L) 09/01/2021   HGB 12.7 09/01/2021   HCT 39.1 09/01/2021   MCV 90.7 09/01/2021   PLT 213 09/01/2021   NEUTROABS 2.5 09/01/2021    Imaging:  No results found.  Medications: I have reviewed the patient's current medications.  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% Left lung radiation 11/08/2016 through 11/21/2016 CT chest 12/12/2016-reexpansion of left upper lobe with a decreased left upper lobe mass, unchanged mediastinal adenopathy, progression of a splenic metastasis/pancreatic tail/gastrohepatic ligament metastasis, right lower lobe pneumonia Cycle 1 Pembrolizumab 12/14/2016 Cycle 2 Pembrolizumab 01/03/2017 Cycle 3 Pembrolizumab 01/24/2017 Cycle 4  Pembrolizumab 02/12/2017 CT 03/05/2018-decrease in left upper lobe mass, mediastinal adenopathy, and splenic mass Cycle 5 pembrolizumab 03/07/2017 Cycle 6 Pembrolizumab 04/02/2017 Cycle 7 pembrolizumab 04/25/2017 Cycle 8 Pembrolizumab 05/14/2017 Cycle 9 Pembrolizumab 06/06/2017 CT 06/20/2017-mild decrease in left upper lobe mass, mild increase in paramediastinal left upper lobe nodule-radiation change?, decreased splenic metastasis Cycle 10 pembrolizumab 06/25/2017 Cycle 11 pembrolizumab 07/18/2017 Cycle 12 pembrolizumab 08/29/2017 Cycle 13 pembrolizumab 09/17/2017 Cycle 14 Pembrolizumab 10/10/2017 CT chest 10/24/2017- slight enlargement of small mediastinal lymph nodes, increased left upper lobe consolidation, decreased size of spleen lesion Cycle 15 pembrolizumab 10/29/2017 Cycle 16 Pembrolizumab 11/21/2017 Cycle 17 Pembrolizumab 12/11/2017 Cycle 18 pembrolizumab 01/02/2018 Cycle 19 Pembrolizumab 01/21/2018 Cycle 20 Pembrolizumab 02/13/2018 CT chest 02/27/2018- decreased left paratracheal node, progressive consolidation/fibrosis in the left upper lung, decreased size of splenic mass Cycle 21 pembrolizumab 03/04/2018 Cycle 22 Pembrolizumab 03/27/2018  Cycle 23 pembrolizumab 04/15/2018 Cycle 24 pembrolizumab 05/08/2018 Cycle 25 Pembrolizumab 05/27/2018 CT chest 06/16/2018- stable left upper lung radiation fibrosis with no evidence of local tumor recurrence.  Decreased left paratracheal adenopathy.  No new or progressive metastatic disease in the chest.  Gastrohepatic ligament lymphadenopathy stable.  Splenic metastasis decreased.  T5 vertebral compression fracture with associated patchy sclerosis and no discrete osseous lesion, new in the interval. Cycle 26 Pembrolizumab 06/19/2018 Cycle 27 pembrolizumab 07/08/2018 Cycle 28 pembrolizumab 07/31/2018 Cycle 29 pembrolizumab 08/19/2018 Cycle 30 Pembrolizumab 09/11/2018 Cycle 31 pembrolizumab 09/30/2018 Cycle 32 pembrolizumab 10/23/2018 CT chest 10/28/2018- left  apical pleural-parenchymal opacity consistent with radiation scarring has become more confluent likely representing evolutionary change.  Continued further decrease in size of index left paratracheal node and splenic metastasis.  Gastrohepatic ligament lymph node not changed. Cycle 33 Pembrolizumab 11/11/2018 Cycle 34 pembrolizumab 12/04/2018 Cycle 35 pembrolizumab 12/23/2018 CT chest  02/12/2019- posttreatment changes left upper lobe unchanged.  Continued decrease in size of splenic metastasis.  Stable appearance of left paratracheal lymph nodes.  Stable gastrohepatic adenopathy.  Incomplete visualization of retroperitoneal lymph nodes in the upper abdomen. CT chest 07/02/2019-stable post treatment related changes left lung.  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  Slight regression of enlarged likely metastatic gastrohepatic lymph node. CT chest 11/11/2019-interval enlargement of a right paratracheal lymph node measuring 14 mm, increased from 5 mm.  No new lung mass or nodularity.  New adenopathy in the upper abdomen.  2.7 cm lesion gastrohepatic ligament.  Necrotic node at the splenic hilum measures 4.7 cm.  Large node along the IVC left upper quadrant measures 2.7 cm.  Similar node left adjacent the aorta measures 1.8 cm.  Enhancing lesion in the spleen is unchanged.  Several low-density lesions in the liver are unchanged. Pembrolizumab resumed on a 3-week schedule 12/03/2019 CT chest 02/22/2020-interval resolution of previously demonstrated left paratracheal and upper abdominal adenopathy.  Stable treated metastasis within the spleen.  Stable radiation changes in the left lung with left hilar distortion.  No evidence of local recurrence or progressive metastatic disease. Continuation of pembrolizumab every 3 weeks 02/26/2020 CT chest 06/28/2020-no change in left upper lobe consolidation, unchanged spleen metastasis, no evidence of progressive disease Pembrolizumab continued every 3 weeks 07/01/2020 CT chest  11/02/2020-stable posttreatment changes at the left hilum and left upper lobe, decreased size of spleen lesion, no evidence of disease progression Pembrolizumab every 3 weeks continued CT chest 01/12/2021-negative for acute pulmonary embolus.  Stable radiation fibrosis left upper lobe.  Stable low-density lesion in the spleen.  No new or progressive findings. 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  Splenic lesion stable.  No upper abdominal lymphadenopathy. Pembrolizumab every 3 weeks continued CT chest 08/29/2021-unchanged consolidation/fibrosis at the left upper lung, no evidence of metastatic disease in the chest, COPD, multiple compression fractures, new T11 compression fracture, stable spleen lesion by my review Pembrolizumab every 3 weeks continued     2.  05/29/2019 laryngoscopy-exophytic appearing mass mid right vocal cord.   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  Tumor cells negative for p16, CMV and HSV 2.  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 Radiation 07/29/2019-08/25/2019   3. History of acute respiratory failure secondary to #1   4. Oxygen dependent COPD   5. History of a NSTEMI 2015   6. Right lung pneumonia on chest CT 12/12/2016-treated with Levaquin   7.  Grade 2 rash possibly related to Pembrolizumab.  Treatment held 08/06/2017.  Improved 08/29/2017, treatment resumed, rash has resolved   8.  Vertigo January 2022-improved with course of Augmentin for ear/sinus infection    Disposition: Brooke [name] appears unchanged.  Plan to continue every 3-week Pembrolizumab, treatment today.  She will return for lab and follow-up on 10/13/2021.  She will contact the office in the interim with any  problems.    [name] ANP/GNP-BC   09/22/2021  10:09 AM

## 2021-09-22 NOTE — Patient Instructions (Signed)
Brooke [name]  Discharge Instructions: Thank you for choosing [facility] to provide your oncology and hematology care.   If you have a lab appointment with the [facility], please go directly to the [facility] and check in at the registration area.   Wear comfortable clothing and clothing appropriate for easy access to any Portacath or PICC line.   We strive to give you quality time with your provider. You may need to reschedule your appointment if you arrive late (15 or more minutes).  Arriving late affects you and other patients whose appointments are after yours.  Also, if you miss three or more appointments without notifying the office, you may be dismissed from the clinic at the provider's discretion.      For prescription refill requests, have your pharmacy contact our office and allow 72 hours for refills to be completed.    Today you received the following chemotherapy and/or immunotherapy agents Keytruda      To help prevent nausea and vomiting after your treatment, we encourage you to take your nausea medication as directed.  BELOW ARE SYMPTOMS THAT SHOULD BE REPORTED IMMEDIATELY: *FEVER GREATER THAN 100.4 F (38 C) OR HIGHER *CHILLS OR SWEATING *NAUSEA AND VOMITING THAT IS NOT CONTROLLED WITH YOUR NAUSEA MEDICATION *UNUSUAL SHORTNESS OF BREATH *UNUSUAL BRUISING OR BLEEDING *URINARY PROBLEMS (pain or burning when urinating, or frequent urination) *BOWEL PROBLEMS (unusual diarrhea, constipation, pain near the anus) TENDERNESS IN MOUTH AND THROAT WITH OR WITHOUT PRESENCE OF ULCERS (sore throat, sores in mouth, or a toothache) UNUSUAL RASH, SWELLING OR PAIN  UNUSUAL VAGINAL DISCHARGE OR ITCHING   Items with * indicate a potential emergency and should be followed up as soon as possible or go to the Emergency Department if any problems should occur.  Please show the CHEMOTHERAPY ALERT CARD or IMMUNOTHERAPY ALERT CARD at check-in to the  Emergency Department and triage nurse.  Should you have questions after your visit or need to cancel or reschedule your appointment, please contact [facility]  Dept: [phone]  and follow the prompts.  Office hours are 8:00 a.m. to 4:30 p.m. Monday - Friday. Please note that voicemails left after 4:00 p.m. may not be returned until the following business day.  We are closed weekends and major holidays. You have access to a nurse at all times for urgent questions. Please call the main number to the clinic Dept: [phone] and follow the prompts.   For any non-urgent questions, you may also contact your provider using MyChart. We now offer e-Visits for anyone 56 and older to request care online for non-urgent symptoms. For details visit [email or web address].   Also download the MyChart app! Go to the app store, search "MyChart", open the app, select [facility], and log in with your MyChart username and password.  Due to Covid, a mask is required upon entering the hospital/clinic. If you do not have a mask, one will be given to you upon arrival. For doctor visits, patients may have 1 support person aged 36 or older with them. For treatment visits, patients cannot have anyone with them due to current Covid guidelines and our immunocompromised population.

## 2021-10-08 ENCOUNTER — Other Ambulatory Visit: Payer: Self-pay | Admitting: Oncology

## 2021-10-10 ENCOUNTER — Telehealth: Payer: Self-pay

## 2021-10-10 NOTE — Telephone Encounter (Signed)
Brooke [name] called in and stated the patient is having diarrhea for the last past 7 days and to gave the patient a call. I called the patient to following up on the diarrhea. Brooke [name] stated she doing much more better and she had a soft bowel movement this morning. She also take imodium. She is eating rice and banana. This was the first time she experience diarrhea and she really do not know what do but to call the on call nurse line. As of today she had only one bowel movement and it was soft. She have no fever, or no pain.  I advice the patient to gave [place] a call if the diarrhea come back. Patient gave verbal understanding and had no other concerns or questions.

## 2021-10-11 ENCOUNTER — Telehealth: Payer: Self-pay | Admitting: *Deleted

## 2021-10-11 NOTE — Telephone Encounter (Signed)
Brooke Blood, NP rounded on her in the home and she has been having diarrhea since the weekend. Did mobile Xray abdomen and she has an ileus. Wanted MD aware they are sending her to [facility] ER for evaluation/treat. [name] notified.

## 2021-10-12 ENCOUNTER — Telehealth: Payer: Self-pay | Admitting: *Deleted

## 2021-10-12 NOTE — Telephone Encounter (Signed)
Call from Murray Hodgkins, NP with verbal report on abd xray yesterday (will fax report). Patient did not go to ER since she was stable. Report: no definite obstruction. Few scattered air-fluid levels that may suggest a component of ileus. Mild-moderate colonic rectal stool content. Correlate with [place] vs CT if ongoing concern for acute viseral organ pathology. Has been instructed in [facility]. She is having no N/V or significant abdominal pain. Has been having loose/watery stools on and off since weekend. Last stool today more formed. She will f/u here on 6/9 as scheduled. Instructed to tell her to go to ER if she starts vomiting or has severe abdominal pain.

## 2021-10-13 ENCOUNTER — Inpatient Hospital Stay: Payer: Medicare Other

## 2021-10-13 ENCOUNTER — Other Ambulatory Visit: Payer: Self-pay

## 2021-10-13 ENCOUNTER — Encounter: Payer: Self-pay | Admitting: *Deleted

## 2021-10-13 ENCOUNTER — Inpatient Hospital Stay (HOSPITAL_BASED_OUTPATIENT_CLINIC_OR_DEPARTMENT_OTHER): Payer: Medicare Other | Admitting: Oncology

## 2021-10-13 ENCOUNTER — Inpatient Hospital Stay: Payer: Medicare Other | Attending: Oncology

## 2021-10-13 VITALS — BP 113/73 | HR 87 | Temp 97.1°F | Resp 18 | Ht 63.0 in | Wt 82.6 lb

## 2021-10-13 DIAGNOSIS — Z9981 Dependence on supplemental oxygen: Secondary | ICD-10-CM | POA: Diagnosis not present

## 2021-10-13 DIAGNOSIS — I252 Old myocardial infarction: Secondary | ICD-10-CM | POA: Insufficient documentation

## 2021-10-13 DIAGNOSIS — Z5112 Encounter for antineoplastic immunotherapy: Secondary | ICD-10-CM | POA: Diagnosis present

## 2021-10-13 DIAGNOSIS — Z86711 Personal history of pulmonary embolism: Secondary | ICD-10-CM | POA: Insufficient documentation

## 2021-10-13 DIAGNOSIS — C3412 Malignant neoplasm of upper lobe, left bronchus or lung: Secondary | ICD-10-CM | POA: Insufficient documentation

## 2021-10-13 DIAGNOSIS — R197 Diarrhea, unspecified: Secondary | ICD-10-CM | POA: Insufficient documentation

## 2021-10-13 DIAGNOSIS — C7889 Secondary malignant neoplasm of other digestive organs: Secondary | ICD-10-CM | POA: Insufficient documentation

## 2021-10-13 DIAGNOSIS — J449 Chronic obstructive pulmonary disease, unspecified: Secondary | ICD-10-CM | POA: Insufficient documentation

## 2021-10-13 DIAGNOSIS — Z79899 Other long term (current) drug therapy: Secondary | ICD-10-CM | POA: Diagnosis not present

## 2021-10-13 LAB — CMP (CANCER CENTER ONLY)
ALT: 16 U/L (ref 0–44)
AST: 17 U/L (ref 15–41)
Albumin: 4.1 g/dL (ref 3.5–5.0)
Alkaline Phosphatase: 51 U/L (ref 38–126)
Anion gap: 11 (ref 5–15)
BUN: 9 mg/dL (ref 8–23)
CO2: 27 mmol/L (ref 22–32)
Calcium: 9.4 mg/dL (ref 8.9–10.3)
Chloride: 100 mmol/L (ref 98–111)
Creatinine: 0.45 mg/dL (ref 0.44–1.00)
GFR, Estimated: >60 mL/min (ref >60)
Glucose, Bld: 90 mg/dL (ref 70–99)
Potassium: 3.4 mmol/L — ABNORMAL LOW (ref 3.5–5.1)
Sodium: 138 mmol/L (ref 135–145)
Total Bilirubin: 0.7 mg/dL (ref 0.3–1.2)
Total Protein: 6.7 g/dL (ref 6.5–8.1)

## 2021-10-13 LAB — CBC WITH DIFFERENTIAL (CANCER CENTER ONLY)
Abs Immature Granulocytes: 0.01 10*3/uL (ref 0.00–0.07)
Basophils Absolute: 0 10*3/uL (ref 0.0–0.1)
Basophils Relative: 0 %
Eosinophils Absolute: 0 10*3/uL (ref 0.0–0.5)
Eosinophils Relative: 1 %
HCT: 41.1 % (ref 36.0–46.0)
Hemoglobin: 13.3 g/dL (ref 12.0–15.0)
Immature Granulocytes: 0 %
Lymphocytes Relative: 10 %
Lymphs Abs: 0.6 10*3/uL — ABNORMAL LOW (ref 0.7–4.0)
MCH: 29.4 pg (ref 26.0–34.0)
MCHC: 32.4 g/dL (ref 30.0–36.0)
MCV: 90.9 fL (ref 80.0–100.0)
Monocytes Absolute: 0.4 10*3/uL (ref 0.1–1.0)
Monocytes Relative: 7 %
Neutro Abs: 4.6 10*3/uL (ref 1.7–7.7)
Neutrophils Relative %: 82 %
Platelet Count: 207 10*3/uL (ref 150–400)
RBC: 4.52 MIL/uL (ref 3.87–5.11)
RDW: 13.3 % (ref 11.5–15.5)
WBC Count: 5.6 10*3/uL (ref 4.0–10.5)
nRBC: 0 % (ref 0.0–0.2)

## 2021-10-13 LAB — TSH: TSH: 1.501 u[IU]/mL (ref 0.350–4.500)

## 2021-10-13 LAB — C DIFFICILE QUICK SCREEN W PCR REFLEX
C Diff antigen: NEGATIVE
C Diff interpretation: NOT DETECTED
C Diff toxin: NEGATIVE

## 2021-10-13 NOTE — Progress Notes (Signed)
[facility] OFFICE PROGRESS NOTE   Diagnosis: Non-small cell lung cancer  INTERVAL HISTORY:   Brooke [name] returns as scheduled.  She was last treated with pembrolizumab on 09/22/2021.  She reports improvement in dyspnea.  She developed diarrhea approximately 10 days ago.  She reports small-volume loose stool up to 4 times per day.  The diarrhea improved yesterday.  She reports 2 small semiformed bowel movements today.  She wonders whether the diarrhea as a result of eating old shrimp and a meal from her cousin over the [facility] Day weekend.  She had a portable plain x-ray evaluation at home on 10/11/2021.  There are a few scattered air-fluid levels indicating possible ileus.  Mild to modest colonic and rectal stool content.  She reports developing discomfort at the right anterior lateral costal margin after the x-ray machine was placed on the abdomen.  The pain occurred during the x-ray procedure and has persisted. No fever.  Mild nausea.  No vomiting.  Objective:  Vital signs in last 24 hours:  Blood pressure 113/73, pulse 87, temperature (!) 97.1 F (36.2 C), temperature source Oral, resp. rate 18, height '5\' 3"'  (1.6 m), weight 82 lb 9.6 oz (37.5 kg), SpO2 99 %.    HEENT: No thrush or ulcers Resp: Distant breath sounds, no respiratory distress Cardio: Regular rate and rhythm GI: No hepatosplenomegaly, no mass, tender at the right costal margin overlying the ribs and right upper abdomen.  No rash. Vascular: No leg edema     Lab Results:  Lab Results  Component Value Date   WBC 5.6 10/13/2021   HGB 13.3 10/13/2021   HCT 41.1 10/13/2021   MCV 90.9 10/13/2021   PLT 207 10/13/2021   NEUTROABS 4.6 10/13/2021    CMP  Lab Results  Component Value Date   NA 138 10/13/2021   K 3.4 (L) 10/13/2021   CL 100 10/13/2021   CO2 27 10/13/2021   GLUCOSE 90 10/13/2021   BUN 9 10/13/2021   CREATININE 0.45 10/13/2021   CALCIUM 9.4 10/13/2021   PROT 6.7 10/13/2021   ALBUMIN  4.1 10/13/2021   AST 17 10/13/2021   ALT 16 10/13/2021   ALKPHOS 51 10/13/2021   BILITOT 0.7 10/13/2021   GFRNONAA >60 10/13/2021   GFRAA >60 02/05/2020     Medications: I have reviewed the patient's current medications.   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% Left lung radiation 11/08/2016 through 11/21/2016 CT chest 12/12/2016-reexpansion of left upper lobe with a decreased left upper lobe mass, unchanged mediastinal adenopathy, progression of a splenic metastasis/pancreatic tail/gastrohepatic ligament metastasis, right lower lobe pneumonia Cycle 1 Pembrolizumab 12/14/2016 Cycle 2 Pembrolizumab 01/03/2017 Cycle 3 Pembrolizumab 01/24/2017 Cycle 4 Pembrolizumab 02/12/2017 CT 03/05/2018-decrease in left upper lobe mass, mediastinal adenopathy, and splenic mass Cycle 5 pembrolizumab 03/07/2017 Cycle 6 Pembrolizumab 04/02/2017 Cycle 7 pembrolizumab 04/25/2017 Cycle 8 Pembrolizumab 05/14/2017 Cycle 9 Pembrolizumab 06/06/2017 CT 06/20/2017-mild decrease in left upper lobe mass, mild increase in paramediastinal left upper lobe nodule-radiation change?, decreased splenic metastasis Cycle 10 pembrolizumab 06/25/2017 Cycle 11 pembrolizumab 07/18/2017 Cycle 12 pembrolizumab 08/29/2017 Cycle 13 pembrolizumab 09/17/2017 Cycle 14 Pembrolizumab 10/10/2017 CT chest 10/24/2017- slight enlargement of small mediastinal  lymph nodes, increased left upper lobe consolidation, decreased size of spleen lesion Cycle 15 pembrolizumab 10/29/2017 Cycle 16 Pembrolizumab 11/21/2017 Cycle 17  Pembrolizumab 12/11/2017 Cycle 18 pembrolizumab 01/02/2018 Cycle 19 Pembrolizumab 01/21/2018 Cycle 20 Pembrolizumab 02/13/2018 CT chest 02/27/2018- decreased left paratracheal node, progressive consolidation/fibrosis in the left upper lung, decreased size of splenic mass Cycle 21 pembrolizumab 03/04/2018 Cycle 22 Pembrolizumab 03/27/2018  Cycle 23 pembrolizumab 04/15/2018 Cycle 24 pembrolizumab 05/08/2018 Cycle 25 Pembrolizumab 05/27/2018 CT chest 06/16/2018- stable left upper lung radiation fibrosis with no evidence of local tumor recurrence.  Decreased left paratracheal adenopathy.  No new or progressive metastatic disease in the chest.  Gastrohepatic ligament lymphadenopathy stable.  Splenic metastasis decreased.  T5 vertebral compression fracture with associated patchy sclerosis and no discrete osseous lesion, new in the interval. Cycle 26 Pembrolizumab 06/19/2018 Cycle 27 pembrolizumab 07/08/2018 Cycle 28 pembrolizumab 07/31/2018 Cycle 29 pembrolizumab 08/19/2018 Cycle 30 Pembrolizumab 09/11/2018 Cycle 31 pembrolizumab 09/30/2018 Cycle 32 pembrolizumab 10/23/2018 CT chest 10/28/2018- left apical pleural-parenchymal opacity consistent with radiation scarring has become more confluent likely representing evolutionary change.  Continued further decrease in size of index left paratracheal node and splenic metastasis.  Gastrohepatic ligament lymph node not changed. Cycle 33 Pembrolizumab 11/11/2018 Cycle 34 pembrolizumab 12/04/2018 Cycle 35 pembrolizumab 12/23/2018 CT chest 02/12/2019- posttreatment changes left upper lobe unchanged.  Continued decrease in size of splenic metastasis.  Stable appearance of left paratracheal lymph nodes.  Stable gastrohepatic adenopathy.  Incomplete visualization of retroperitoneal lymph nodes in the upper abdomen. CT chest 07/02/2019-stable post treatment related changes left lung.  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  Slight regression of enlarged likely metastatic gastrohepatic  lymph node. CT chest 11/11/2019-interval enlargement of a right paratracheal lymph node measuring 14 mm, increased from 5 mm.  No new lung mass or nodularity.  New adenopathy in the upper abdomen.  2.7 cm lesion gastrohepatic ligament.  Necrotic node at the splenic hilum measures 4.7 cm.  Large node along the IVC left upper quadrant measures 2.7 cm.  Similar node left adjacent the aorta measures 1.8 cm.  Enhancing lesion in the spleen is unchanged.  Several low-density lesions in the liver are unchanged. Pembrolizumab resumed on a 3-week schedule 12/03/2019 CT chest 02/22/2020-interval resolution of previously demonstrated left paratracheal and upper abdominal adenopathy.  Stable treated metastasis within the spleen.  Stable radiation changes in the left lung with left hilar distortion.  No evidence of local recurrence or progressive metastatic disease. Continuation of pembrolizumab every 3 weeks 02/26/2020 CT chest 06/28/2020-no change in left upper lobe consolidation, unchanged spleen metastasis, no evidence of progressive disease Pembrolizumab continued every 3 weeks 07/01/2020 CT chest 11/02/2020-stable posttreatment changes at the left hilum and left upper lobe, decreased size of spleen lesion, no evidence of disease progression Pembrolizumab every 3 weeks continued CT chest 01/12/2021-negative for acute pulmonary embolus.  Stable radiation fibrosis left upper lobe.  Stable low-density lesion in the spleen.  No new or progressive findings. 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  Splenic lesion stable.  No upper abdominal lymphadenopathy. Pembrolizumab every 3 weeks continued CT chest 08/29/2021-unchanged consolidation/fibrosis at the left upper lung, no evidence of  metastatic disease in the chest, COPD, multiple compression fractures, new T11 compression fracture, stable spleen lesion by my review Pembrolizumab every 3 weeks continued     2.  05/29/2019 laryngoscopy-exophytic appearing mass mid right vocal cord.   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  Tumor cells negative for p16, CMV and HSV 2.  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 Radiation 07/29/2019-08/25/2019   3. History of acute respiratory failure secondary to #1   4. Oxygen dependent COPD   5. History of a NSTEMI 2015   6. Right lung pneumonia on chest CT 12/12/2016-treated with Levaquin   7.  Grade 2 rash possibly related to Pembrolizumab.  Treatment held 08/06/2017.  Improved 08/29/2017, treatment resumed, rash has resolved   8.  Vertigo January 2022-improved with course of Augmentin for ear/sinus infection  9.  Diarrhea beginning early June 2023     Disposition: Brooke [name] has a history of non-small cell lung cancer.  She has been treated with pembrolizumab for the past several years.  She developed diarrhea approximately 10 days ago.  I have a low clinical suspicion for pembrolizumab related colitis.  We will check a stool sample for the C. difficile toxin.  Treatment will be held today.  Encouraged her to increase her diet and fluid intake as tolerated.  She will call if the diarrhea does not resolve.  She will return for an office visit and pembrolizumab as scheduled on 11/03/2021.  I suspect the discomfort of the right costal margin is related to the x-ray procedure.  Betsy Coder, MD  10/13/2021  10:16 AM

## 2021-10-13 NOTE — Progress Notes (Signed)
Patient seen by [name] today  Vitals are within treatment parameters.  Labs reviewed by [name] and are within treatment parameters.  Per physician team, patient will not be receiving treatment today.

## 2021-10-16 ENCOUNTER — Telehealth: Payer: Self-pay

## 2021-10-16 NOTE — Telephone Encounter (Signed)
TC from Pt inquiring about lab results from stool culture. Informed Pt that she was negative for C diff discussed low fiber diet with Pt and informed her if she had any other problems or concerns she could return a call to the office.

## 2021-11-03 ENCOUNTER — Inpatient Hospital Stay (HOSPITAL_BASED_OUTPATIENT_CLINIC_OR_DEPARTMENT_OTHER): Payer: Medicare Other | Admitting: Nurse Practitioner

## 2021-11-03 ENCOUNTER — Ambulatory Visit (HOSPITAL_BASED_OUTPATIENT_CLINIC_OR_DEPARTMENT_OTHER)
Admission: RE | Admit: 2021-11-03 | Discharge: 2021-11-03 | Disposition: A | Discharge status: Home / Self Care | Payer: Medicare Other | Source: Ambulatory Visit | Attending: Nurse Practitioner | Admitting: Nurse Practitioner

## 2021-11-03 ENCOUNTER — Inpatient Hospital Stay: Payer: Medicare Other

## 2021-11-03 ENCOUNTER — Encounter: Payer: Self-pay | Admitting: *Deleted

## 2021-11-03 ENCOUNTER — Encounter: Payer: Self-pay | Admitting: Nurse Practitioner

## 2021-11-03 VITALS — BP 149/80 | HR 84 | Temp 97.8°F | Resp 20 | Ht 63.0 in | Wt 84.4 lb

## 2021-11-03 DIAGNOSIS — C3412 Malignant neoplasm of upper lobe, left bronchus or lung: Secondary | ICD-10-CM

## 2021-11-03 DIAGNOSIS — Z5112 Encounter for antineoplastic immunotherapy: Secondary | ICD-10-CM | POA: Insufficient documentation

## 2021-11-03 LAB — CMP (CANCER CENTER ONLY)
ALT: 18 U/L (ref 0–44)
AST: 19 U/L (ref 15–41)
Albumin: 3.9 g/dL (ref 3.5–5.0)
Alkaline Phosphatase: 45 U/L (ref 38–126)
Anion gap: 9 (ref 5–15)
BUN: 12 mg/dL (ref 8–23)
CO2: 28 mmol/L (ref 22–32)
Calcium: 9.2 mg/dL (ref 8.9–10.3)
Chloride: 103 mmol/L (ref 98–111)
Creatinine: 0.52 mg/dL (ref 0.44–1.00)
GFR, Estimated: >60 mL/min (ref >60)
Glucose, Bld: 97 mg/dL (ref 70–99)
Potassium: 3.4 mmol/L — ABNORMAL LOW (ref 3.5–5.1)
Sodium: 140 mmol/L (ref 135–145)
Total Bilirubin: 0.4 mg/dL (ref 0.3–1.2)
Total Protein: 6.5 g/dL (ref 6.5–8.1)

## 2021-11-03 NOTE — Progress Notes (Signed)
Patient seen by [name] NP today  Vitals are within treatment parameters.  Labs reviewed by [name] NP and are within treatment parameters.  CBC not needed today  Per physician team, patient will not be receiving treatment today. Tx on hold due to persistent diarrhea.

## 2021-11-03 NOTE — Progress Notes (Signed)
[facility] OFFICE PROGRESS NOTE   Diagnosis: Non-small cell lung cancer  INTERVAL HISTORY:   [name] returns as scheduled.  Pembrolizumab was held 10/13/2021 due to diarrhea.  Stool tested negative for C. difficile.  She continues to have loose stools but she feels constipated.  She estimates 4-5 bowel movements a day, mainly liquid but sometimes formed.  She feels uncomfortable with abdominal distention.  No nausea or vomiting.  No fever.  No bleeding with bowel movements.  No rash.  Objective:  Vital signs in last 24 hours:  Blood pressure (!) 149/80, pulse 84, temperature 97.8 F (36.6 C), temperature source Oral, resp. rate 20, height '5\' 3"'  (1.6 m), weight 84 lb 6.4 oz (38.3 kg), SpO2 100 %.    HEENT: No thrush or ulcers. Resp: Distant breath sounds.  No respiratory distress. Cardio: Regular rate and rhythm. GI: Abdomen is softly distended.  Bowel sounds present.  No hepatomegaly. Vascular: No leg edema. Skin: No rash.   Lab Results:  Lab Results  Component Value Date   WBC 5.6 10/13/2021   HGB 13.3 10/13/2021   HCT 41.1 10/13/2021   MCV 90.9 10/13/2021   PLT 207 10/13/2021   NEUTROABS 4.6 10/13/2021    Imaging:  No results found.  Medications: I have reviewed the patient's current medications.  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% Left lung radiation 11/08/2016 through 11/21/2016 CT chest 12/12/2016-reexpansion of left upper lobe with a decreased left upper lobe mass, unchanged mediastinal adenopathy, progression of a  splenic metastasis/pancreatic tail/gastrohepatic ligament metastasis, right lower lobe pneumonia Cycle 1 Pembrolizumab 12/14/2016 Cycle 2 Pembrolizumab 01/03/2017 Cycle 3 Pembrolizumab 01/24/2017 Cycle 4 Pembrolizumab 02/12/2017 CT 03/05/2018-decrease in left upper lobe mass, mediastinal adenopathy, and splenic mass Cycle 5 pembrolizumab 03/07/2017 Cycle 6 Pembrolizumab 04/02/2017 Cycle 7 pembrolizumab 04/25/2017 Cycle 8 Pembrolizumab 05/14/2017 Cycle 9 Pembrolizumab 06/06/2017 CT 06/20/2017-mild decrease in left upper lobe mass, mild increase in paramediastinal left upper lobe nodule-radiation change?, decreased splenic metastasis Cycle 10 pembrolizumab 06/25/2017 Cycle 11 pembrolizumab 07/18/2017 Cycle 12 pembrolizumab 08/29/2017 Cycle 13 pembrolizumab 09/17/2017 Cycle 14 Pembrolizumab 10/10/2017 CT chest 10/24/2017- slight enlargement of small mediastinal lymph nodes, increased left upper lobe consolidation, decreased size of spleen lesion Cycle 15 pembrolizumab 10/29/2017 Cycle 16 Pembrolizumab 11/21/2017 Cycle 17 Pembrolizumab 12/11/2017 Cycle 18 pembrolizumab 01/02/2018 Cycle 19 Pembrolizumab 01/21/2018 Cycle 20 Pembrolizumab 02/13/2018 CT chest 02/27/2018- decreased left paratracheal node, progressive consolidation/fibrosis in the left upper lung, decreased size of splenic mass Cycle 21 pembrolizumab 03/04/2018 Cycle 22 Pembrolizumab 03/27/2018  Cycle 23 pembrolizumab 04/15/2018 Cycle 24 pembrolizumab 05/08/2018 Cycle 25 Pembrolizumab 05/27/2018 CT chest 06/16/2018- stable left upper lung radiation fibrosis with no evidence of local tumor recurrence.  Decreased left paratracheal adenopathy.  No new or progressive metastatic disease in the chest.  Gastrohepatic ligament lymphadenopathy stable.  Splenic metastasis decreased.  T5 vertebral compression fracture with associated patchy sclerosis and no discrete osseous lesion, new in the interval. Cycle 26 Pembrolizumab 06/19/2018 Cycle 27 pembrolizumab  07/08/2018 Cycle 28 pembrolizumab 07/31/2018 Cycle 29 pembrolizumab 08/19/2018 Cycle 30 Pembrolizumab 09/11/2018 Cycle 31 pembrolizumab 09/30/2018 Cycle 32 pembrolizumab 10/23/2018 CT chest 10/28/2018- left apical pleural-parenchymal opacity consistent with radiation scarring has become more  confluent likely representing evolutionary change.  Continued further decrease in size of index left paratracheal node and splenic metastasis.  Gastrohepatic ligament lymph node not changed. Cycle 33 Pembrolizumab 11/11/2018 Cycle 34 pembrolizumab 12/04/2018 Cycle 35 pembrolizumab 12/23/2018 CT chest 02/12/2019- posttreatment changes left upper lobe unchanged.  Continued decrease in size of splenic metastasis.  Stable appearance of left paratracheal lymph nodes.  Stable gastrohepatic adenopathy.  Incomplete visualization of retroperitoneal lymph nodes in the upper abdomen. CT chest 07/02/2019-stable post treatment related changes left lung.  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  Slight regression of enlarged likely metastatic gastrohepatic lymph node. CT chest 11/11/2019-interval enlargement of a right paratracheal lymph node measuring 14 mm, increased from 5 mm.  No new lung mass or nodularity.  New adenopathy in the upper abdomen.  2.7 cm lesion gastrohepatic ligament.  Necrotic node at the splenic hilum measures 4.7 cm.  Large node along the IVC left upper quadrant measures 2.7 cm.  Similar node left adjacent the aorta measures 1.8 cm.  Enhancing lesion in the spleen is unchanged.  Several low-density lesions in the liver are unchanged. Pembrolizumab resumed on a 3-week schedule 12/03/2019 CT chest 02/22/2020-interval resolution of previously demonstrated left paratracheal and upper abdominal adenopathy.  Stable treated metastasis within the spleen.  Stable radiation changes in the left lung with left hilar distortion.  No evidence of local recurrence or progressive metastatic disease. Continuation of pembrolizumab every 3  weeks 02/26/2020 CT chest 06/28/2020-no change in left upper lobe consolidation, unchanged spleen metastasis, no evidence of progressive disease Pembrolizumab continued every 3 weeks 07/01/2020 CT chest 11/02/2020-stable posttreatment changes at the left hilum and left upper lobe, decreased size of spleen lesion, no evidence of disease progression Pembrolizumab every 3 weeks continued CT chest 01/12/2021-negative for acute pulmonary embolus.  Stable radiation fibrosis left upper lobe.  Stable low-density lesion in the spleen.  No new or progressive findings. 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  Splenic lesion stable.  No upper abdominal lymphadenopathy. Pembrolizumab every 3 weeks continued CT chest 08/29/2021-unchanged consolidation/fibrosis at the left upper lung, no evidence of metastatic disease in the chest, COPD, multiple compression fractures, new T11 compression fracture, stable spleen lesion by my review Pembrolizumab every 3 weeks continued Treatment held 10/13/2021 for diarrhea, C. difficile negative     2.  05/29/2019 laryngoscopy-exophytic appearing mass mid right vocal cord.   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  Tumor cells negative for p16, CMV and HSV 2.  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 Radiation 07/29/2019-08/25/2019   3. History of acute respiratory failure secondary to #1   4. Oxygen dependent COPD   5. History of a NSTEMI 2015   6. Right lung pneumonia on chest CT 12/12/2016-treated with Levaquin   7.  Grade 2 rash possibly related to Pembrolizumab.  Treatment held 08/06/2017.  Improved 08/29/2017, treatment resumed, rash has resolved   8.  Vertigo January 2022-improved with  course of Augmentin for ear/sinus infection   9.  Diarrhea beginning early June 2023  Disposition: Ms. Storts appears unchanged.  Treatment was held 3 weeks ago due to diarrhea.  She continues to have loose stools but feels constipated.  Question impaction.  We are referring her for an abdominal x-ray today.  She prefers to hold treatment  until bowel habits have returned to normal.  Holding treatment today.  She will return for follow-up and possible treatment in 1 week.    [name] ANP/GNP-BC   11/03/2021  9:34 AM

## 2021-11-04 ENCOUNTER — Other Ambulatory Visit: Payer: Self-pay | Admitting: Oncology

## 2021-11-06 ENCOUNTER — Telehealth: Payer: Self-pay

## 2021-11-06 NOTE — Telephone Encounter (Signed)
Patient stated she started taking stool softener on 11/01/21. She's having bowel movement about 4x daily small amount hard fecal and liquid. She denied any fever, pain or blood. She also denied belly cramping and bloating and stool leakage. When she goes and  wipes it's yellow.

## 2021-11-06 NOTE — Telephone Encounter (Signed)
-----   Message from Owens Shark, NP sent at 11/06/2021 12:03 PM EDT ----- Please let her know the abdominal x-ray shows significant stool burden.  Is she taking a laxative?

## 2021-11-06 NOTE — Telephone Encounter (Signed)
-----   Message from Owens Shark, NP sent at 11/06/2021  4:25 PM EDT ----- Have her begin Miralax 17 grams daily; please f/u with her on 7/5 ----- Message ----- From: Velna Hatchet, LPN Sent: 07/12/9442   4:16 PM EDT To: Owens Shark, NP  Mrs. Sayres is taking stool softener daily. She started on the 6/28. Today, she had a bowel movement  4x. Small amount of fecal with liquid. She usually have a bowel movement daily. No blood when she wipe just a yellow color.  ----- Message ----- From: Owens Shark, NP Sent: 11/06/2021  12:04 PM EDT To: [facility]  Please let her know the abdominal x-ray shows significant stool burden.  Is she taking a laxative?

## 2021-11-06 NOTE — Telephone Encounter (Signed)
Called Brooke [name] to let know to start taking the Miralax 17 grams daily. Patient gave verbal understanding and had no further questions or concerns.

## 2021-11-08 ENCOUNTER — Telehealth: Payer: Self-pay

## 2021-11-08 NOTE — Telephone Encounter (Signed)
Called and spoke Brooke [name] since she started taking Miralax. Patient stated she have not start taking the Miralax yet. She stated she being having bowel movements, she went between 10-15 time today. Each BM was a small amount. Also she was  not sure if she need to take the stool softener and the miralax together. Patient denied any fever, belly cramping or bloating.

## 2021-11-09 ENCOUNTER — Other Ambulatory Visit: Payer: Self-pay | Admitting: *Deleted

## 2021-11-09 DIAGNOSIS — C3412 Malignant neoplasm of upper lobe, left bronchus or lung: Secondary | ICD-10-CM

## 2021-11-09 NOTE — Progress Notes (Signed)
Per [name], only needs CMP for treatment on 11/10/21.

## 2021-11-10 ENCOUNTER — Inpatient Hospital Stay (HOSPITAL_BASED_OUTPATIENT_CLINIC_OR_DEPARTMENT_OTHER): Payer: Medicare Other | Admitting: Oncology

## 2021-11-10 ENCOUNTER — Inpatient Hospital Stay: Payer: Medicare Other | Attending: Oncology

## 2021-11-10 ENCOUNTER — Encounter: Payer: Self-pay | Admitting: *Deleted

## 2021-11-10 ENCOUNTER — Inpatient Hospital Stay: Payer: Medicare Other

## 2021-11-10 VITALS — BP 145/79 | HR 80 | Temp 98.1°F | Resp 18 | Ht 63.0 in | Wt 85.6 lb

## 2021-11-10 DIAGNOSIS — Z5112 Encounter for antineoplastic immunotherapy: Secondary | ICD-10-CM | POA: Insufficient documentation

## 2021-11-10 DIAGNOSIS — J440 Chronic obstructive pulmonary disease with acute lower respiratory infection: Secondary | ICD-10-CM | POA: Diagnosis not present

## 2021-11-10 DIAGNOSIS — Z86711 Personal history of pulmonary embolism: Secondary | ICD-10-CM | POA: Insufficient documentation

## 2021-11-10 DIAGNOSIS — I252 Old myocardial infarction: Secondary | ICD-10-CM | POA: Diagnosis not present

## 2021-11-10 DIAGNOSIS — Z9981 Dependence on supplemental oxygen: Secondary | ICD-10-CM | POA: Diagnosis not present

## 2021-11-10 DIAGNOSIS — C252 Malignant neoplasm of tail of pancreas: Secondary | ICD-10-CM | POA: Insufficient documentation

## 2021-11-10 DIAGNOSIS — C3412 Malignant neoplasm of upper lobe, left bronchus or lung: Secondary | ICD-10-CM

## 2021-11-10 DIAGNOSIS — R21 Rash and other nonspecific skin eruption: Secondary | ICD-10-CM | POA: Insufficient documentation

## 2021-11-10 DIAGNOSIS — C7889 Secondary malignant neoplasm of other digestive organs: Secondary | ICD-10-CM | POA: Diagnosis not present

## 2021-11-10 DIAGNOSIS — R197 Diarrhea, unspecified: Secondary | ICD-10-CM | POA: Diagnosis not present

## 2021-11-10 DIAGNOSIS — Z79899 Other long term (current) drug therapy: Secondary | ICD-10-CM | POA: Diagnosis not present

## 2021-11-10 LAB — CMP (CANCER CENTER ONLY)
ALT: 19 U/L (ref 0–44)
AST: 20 U/L (ref 15–41)
Albumin: 4.1 g/dL (ref 3.5–5.0)
Alkaline Phosphatase: 48 U/L (ref 38–126)
Anion gap: 9 (ref 5–15)
BUN: 11 mg/dL (ref 8–23)
CO2: 30 mmol/L (ref 22–32)
Calcium: 9.4 mg/dL (ref 8.9–10.3)
Chloride: 100 mmol/L (ref 98–111)
Creatinine: 0.44 mg/dL (ref 0.44–1.00)
GFR, Estimated: >60 mL/min (ref >60)
Glucose, Bld: 91 mg/dL (ref 70–99)
Potassium: 3.2 mmol/L — ABNORMAL LOW (ref 3.5–5.1)
Sodium: 139 mmol/L (ref 135–145)
Total Bilirubin: 0.5 mg/dL (ref 0.3–1.2)
Total Protein: 6.5 g/dL (ref 6.5–8.1)

## 2021-11-10 MED ORDER — SODIUM CHLORIDE 0.9 % IV SOLN
200.0000 mg | Freq: Once | INTRAVENOUS | Status: AC
  Administered 2021-11-10: 200 mg via INTRAVENOUS
  Filled 2021-11-10: qty 8

## 2021-11-10 MED ORDER — POTASSIUM CHLORIDE CRYS ER 10 MEQ PO TBCR: 10.0000 meq | EXTENDED_RELEASE_TABLET | Freq: Every day | ORAL | 1 refills | Status: DC

## 2021-11-10 MED ORDER — SODIUM CHLORIDE 0.9 % IV SOLN: Freq: Once | INTRAVENOUS | Status: AC

## 2021-11-10 NOTE — Progress Notes (Signed)
Patient seen by Dr. Sherrill today ? ?Vitals are within treatment parameters. ? ?Labs reviewed by Dr. Sherrill and are within treatment parameters. ? ?Per physician team, patient is ready for treatment and there are NO modifications to the treatment plan.  ?

## 2021-11-10 NOTE — Patient Instructions (Signed)
[facility]  Discharge Instructions: Thank you for choosing [facility] to provide your oncology and hematology care.   If you have a lab appointment with the [facility], please go directly to the [facility] and check in at the registration area.   Wear comfortable clothing and clothing appropriate for easy access to any Portacath or PICC line.   We strive to give you quality time with your provider. You may need to reschedule your appointment if you arrive late (15 or more minutes).  Arriving late affects you and other patients whose appointments are after yours.  Also, if you miss three or more appointments without notifying the office, you may be dismissed from the clinic at the provider's discretion.      For prescription refill requests, have your pharmacy contact our office and allow 72 hours for refills to be completed.    Today you received the following chemotherapy and/or immunotherapy agents Keytruda      To help prevent nausea and vomiting after your treatment, we encourage you to take your nausea medication as directed.  BELOW ARE SYMPTOMS THAT SHOULD BE REPORTED IMMEDIATELY: *FEVER GREATER THAN 100.4 F (38 C) OR HIGHER *CHILLS OR SWEATING *NAUSEA AND VOMITING THAT IS NOT CONTROLLED WITH YOUR NAUSEA MEDICATION *UNUSUAL SHORTNESS OF BREATH *UNUSUAL BRUISING OR BLEEDING *URINARY PROBLEMS (pain or burning when urinating, or frequent urination) *BOWEL PROBLEMS (unusual diarrhea, constipation, pain near the anus) TENDERNESS IN MOUTH AND THROAT WITH OR WITHOUT PRESENCE OF ULCERS (sore throat, sores in mouth, or a toothache) UNUSUAL RASH, SWELLING OR PAIN  UNUSUAL VAGINAL DISCHARGE OR ITCHING   Items with * indicate a potential emergency and should be followed up as soon as possible or go to the Emergency Department if any problems should occur.  Please show the CHEMOTHERAPY ALERT CARD or IMMUNOTHERAPY ALERT CARD at check-in to the  Emergency Department and triage nurse.  Should you have questions after your visit or need to cancel or reschedule your appointment, please contact [facility]  Dept: [phone]  and follow the prompts.  Office hours are 8:00 a.m. to 4:30 p.m. Monday - Friday. Please note that voicemails left after 4:00 p.m. may not be returned until the following business day.  We are closed weekends and major holidays. You have access to a nurse at all times for urgent questions. Please call the main number to the clinic Dept: [phone] and follow the prompts.   For any non-urgent questions, you may also contact your provider using MyChart. We now offer e-Visits for anyone 75 and older to request care online for non-urgent symptoms. For details visit [email or web address].   Also download the MyChart app! Go to the app store, search "MyChart", open the app, select [facility], and log in with your MyChart username and password.  Due to Covid, a mask is required upon entering the hospital/clinic. If you do not have a mask, one will be given to you upon arrival. For doctor visits, patients may have 1 support person aged 32 or older with them. For treatment visits, patients cannot have anyone with them due to current Covid guidelines and our immunocompromised population.

## 2021-11-10 NOTE — Progress Notes (Signed)
[facility] OFFICE PROGRESS NOTE   Diagnosis: Non-small cell lung cancer  INTERVAL HISTORY:   Brooke [name] returns as scheduled.  She was last treated with pembrolizumab 09/22/2021.  Treatment has been on hold secondary to "diarrhea ".  She started a stool softener and MiraLAX this week.  She reports having a normal bowel movement a few days ago.  No dyspnea.  She feels fullness in the left lower abdomen and believes the colon is becoming "obstructed ".  Objective:  Vital signs in last 24 hours:  Blood pressure (!) 145/79, pulse 80, temperature 98.1 F (36.7 C), temperature source Oral, resp. rate 18, height '5\' 3"'  (1.6 m), weight 85 lb 9.6 oz (38.8 kg), SpO2 100 %.    Resp: Distant breath sounds, bronchial sounds at the left upper posterior chest, no respiratory distress Cardio: Regular rate and rhythm GI: No hepatosplenomegaly, no mass, nontender Vascular: No leg edema  Skin: No rash, few excoriated papular lesions at the right upper back  Lab Results:  Lab Results  Component Value Date   WBC 5.6 10/13/2021   HGB 13.3 10/13/2021   HCT 41.1 10/13/2021   MCV 90.9 10/13/2021   PLT 207 10/13/2021   NEUTROABS 4.6 10/13/2021    CMP  Lab Results  Component Value Date   NA 140 11/03/2021   K 3.4 (L) 11/03/2021   CL 103 11/03/2021   CO2 28 11/03/2021   GLUCOSE 97 11/03/2021   BUN 12 11/03/2021   CREATININE 0.52 11/03/2021   CALCIUM 9.2 11/03/2021   PROT 6.5 11/03/2021   ALBUMIN 3.9 11/03/2021   AST 19 11/03/2021   ALT 18 11/03/2021   ALKPHOS 45 11/03/2021   BILITOT 0.4 11/03/2021   GFRNONAA >60 11/03/2021   GFRAA >60 02/05/2020     Medications: I have reviewed the patient's current medications.   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% Left lung radiation 11/08/2016 through 11/21/2016 CT chest 12/12/2016-reexpansion of left upper lobe with a decreased left upper lobe mass, unchanged mediastinal adenopathy, progression of a splenic metastasis/pancreatic tail/gastrohepatic ligament metastasis, right lower lobe pneumonia Cycle 1 Pembrolizumab 12/14/2016 Cycle 2 Pembrolizumab 01/03/2017 Cycle 3 Pembrolizumab 01/24/2017 Cycle 4 Pembrolizumab 02/12/2017 CT 03/05/2018-decrease in left upper lobe mass, mediastinal adenopathy, and splenic mass Cycle 5 pembrolizumab 03/07/2017 Cycle 6 Pembrolizumab 04/02/2017 Cycle 7 pembrolizumab 04/25/2017 Cycle 8 Pembrolizumab 05/14/2017 Cycle 9 Pembrolizumab 06/06/2017 CT 06/20/2017-mild decrease in left upper lobe mass, mild increase in paramediastinal left upper lobe nodule-radiation change?, decreased splenic metastasis Cycle 10 pembrolizumab 06/25/2017 Cycle 11 pembrolizumab 07/18/2017 Cycle 12 pembrolizumab 08/29/2017 Cycle 13 pembrolizumab 09/17/2017 Cycle 14 Pembrolizumab 10/10/2017 CT chest 10/24/2017- slight enlargement of small mediastinal lymph nodes, increased left upper lobe consolidation, decreased size of spleen lesion Cycle 15 pembrolizumab 10/29/2017 Cycle 16 Pembrolizumab 11/21/2017 Cycle 17 Pembrolizumab 12/11/2017 Cycle 18 pembrolizumab 01/02/2018 Cycle 19 Pembrolizumab 01/21/2018 Cycle 20 Pembrolizumab 02/13/2018 CT chest 02/27/2018- decreased left paratracheal node, progressive consolidation/fibrosis in the left upper lung, decreased size of splenic mass Cycle 21 pembrolizumab 03/04/2018 Cycle 22 Pembrolizumab 03/27/2018  Cycle 23 pembrolizumab 04/15/2018 Cycle 24 pembrolizumab 05/08/2018 Cycle 25 Pembrolizumab 05/27/2018 CT chest 06/16/2018- stable left upper lung  radiation fibrosis with  no evidence of local tumor recurrence.  Decreased left paratracheal adenopathy.  No new or progressive metastatic disease in the chest.  Gastrohepatic ligament lymphadenopathy stable.  Splenic metastasis decreased.  T5 vertebral compression fracture with associated patchy sclerosis and no discrete osseous lesion, new in the interval. Cycle 26 Pembrolizumab 06/19/2018 Cycle 27 pembrolizumab 07/08/2018 Cycle 28 pembrolizumab 07/31/2018 Cycle 29 pembrolizumab 08/19/2018 Cycle 30 Pembrolizumab 09/11/2018 Cycle 31 pembrolizumab 09/30/2018 Cycle 32 pembrolizumab 10/23/2018 CT chest 10/28/2018- left apical pleural-parenchymal opacity consistent with radiation scarring has become more confluent likely representing evolutionary change.  Continued further decrease in size of index left paratracheal node and splenic metastasis.  Gastrohepatic ligament lymph node not changed. Cycle 33 Pembrolizumab 11/11/2018 Cycle 34 pembrolizumab 12/04/2018 Cycle 35 pembrolizumab 12/23/2018 CT chest 02/12/2019- posttreatment changes left upper lobe unchanged.  Continued decrease in size of splenic metastasis.  Stable appearance of left paratracheal lymph nodes.  Stable gastrohepatic adenopathy.  Incomplete visualization of retroperitoneal lymph nodes in the upper abdomen. CT chest 07/02/2019-stable post treatment related changes left lung.  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  Slight regression of enlarged likely metastatic gastrohepatic lymph node. CT chest 11/11/2019-interval enlargement of a right paratracheal lymph node measuring 14 mm, increased from 5 mm.  No new lung mass or nodularity.  New adenopathy in the upper abdomen.  2.7 cm lesion gastrohepatic ligament.  Necrotic node at the splenic hilum measures 4.7 cm.  Large node along the IVC left upper quadrant measures 2.7 cm.  Similar node left adjacent the aorta measures 1.8 cm.  Enhancing lesion in the spleen is unchanged.  Several low-density lesions in the  liver are unchanged. Pembrolizumab resumed on a 3-week schedule 12/03/2019 CT chest 02/22/2020-interval resolution of previously demonstrated left paratracheal and upper abdominal adenopathy.  Stable treated metastasis within the spleen.  Stable radiation changes in the left lung with left hilar distortion.  No evidence of local recurrence or progressive metastatic disease. Continuation of pembrolizumab every 3 weeks 02/26/2020 CT chest 06/28/2020-no change in left upper lobe consolidation, unchanged spleen metastasis, no evidence of progressive disease Pembrolizumab continued every 3 weeks 07/01/2020 CT chest 11/02/2020-stable posttreatment changes at the left hilum and left upper lobe, decreased size of spleen lesion, no evidence of disease progression Pembrolizumab every 3 weeks continued CT chest 01/12/2021-negative for acute pulmonary embolus.  Stable radiation fibrosis left upper lobe.  Stable low-density lesion in the spleen.  No new or progressive findings. 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  Splenic lesion stable.  No upper abdominal lymphadenopathy. Pembrolizumab every 3 weeks continued CT chest 08/29/2021-unchanged consolidation/fibrosis at the left upper lung, no evidence of metastatic disease in the chest, COPD, multiple compression fractures, new T11 compression fracture, stable spleen lesion by my review Pembrolizumab every 3 weeks continued Treatment held 10/13/2021 for diarrhea, C. difficile negative     2.  05/29/2019 laryngoscopy-exophytic appearing mass mid right vocal cord.   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  Tumor cells negative for p16, CMV and HSV 2.  Rare cells show positive nuclear HSV-1 staining of unknown  significance. Radiation 07/29/2019-08/25/2019   3. History of acute respiratory failure secondary to #1   4. Oxygen dependent COPD   5. History of a NSTEMI 2015   6. Right lung pneumonia on chest CT 12/12/2016-treated with Levaquin  7.  Grade 2 rash possibly related to Pembrolizumab.  Treatment held 08/06/2017.  Improved 08/29/2017, treatment resumed, rash has resolved   8.  Vertigo January 2022-improved with course of Augmentin for ear/sinus infection   9.  Diarrhea beginning early June 2023    Disposition: Brooke [name] is in clinical remission from non-small cell lung cancer.  The GI symptoms she has experienced over the past few weeks were likely related to constipation.  She will continue stool softener and MiraLAX.  She will resume treatment with pembrolizumab today.  Brooke [name] will return for an office visit and pembrolizumab in 3 weeks.  Betsy Coder, MD  11/10/2021  10:04 AM

## 2021-11-10 NOTE — Patient Instructions (Signed)
For constipation: Take MiraLax 17 grams (1 capful) in 8 oz fluid daily and colace 100 mg daily. Your potassium level is low: Start KCL tablet 10 meq by mouth daily-script has been sent to [facility] Constipation, Adult Constipation is when a person has trouble pooping (having a bowel movement). When you have this condition, you may poop fewer than 3 times a week. Your poop (stool) may also be dry, hard, or bigger than normal. Follow these instructions at home: Eating and drinking  Eat foods that have a lot of fiber, such as: Fresh fruits and vegetables. Whole grains. Beans. Eat less of foods that are low in fiber and high in fat and sugar, such as: [place] fries. Hamburgers. Cookies. Candy. Soda. Drink enough fluid to keep your pee (urine) pale yellow. General instructions Exercise regularly or as told by your doctor. Try to do 150 minutes of exercise each week. Go to the restroom when you feel like you need to poop. Do not hold it in. Take over-the-counter and prescription medicines only as told by your doctor. These include any fiber supplements. When you poop: Do deep breathing while relaxing your lower belly (abdomen). Relax your pelvic floor. The pelvic floor is a group of muscles that support the rectum, bladder, and intestines (as well as the uterus in women). Watch your condition for any changes. Tell your doctor if you notice any. Keep all follow-up visits as told by your doctor. This is important. Contact a doctor if: You have pain that gets worse. You have a fever. You have not pooped for 4 days. You vomit. You are not hungry. You lose weight. You are bleeding from the opening of the butt (anus). You have thin, pencil-like poop. Get help right away if: You have a fever, and your symptoms suddenly get worse. You leak poop or have blood in your poop. Your belly feels hard or bigger than normal (bloated). You have very bad belly pain. You feel dizzy or you  faint. Summary Constipation is when a person poops fewer than 3 times a week, has trouble pooping, or has poop that is dry, hard, or bigger than normal. Eat foods that have a lot of fiber. Drink enough fluid to keep your pee (urine) pale yellow. Take over-the-counter and prescription medicines only as told by your doctor. These include any fiber supplements. This information is not intended to replace advice given to you by your health care provider. Make sure you discuss any questions you have with your health care provider. Document Revised: 03/11/2019 Document Reviewed: 03/11/2019 Elsevier Patient Education  [place].    Hypokalemia Hypokalemia means that the amount of potassium in the blood is lower than normal. Potassium is a mineral (electrolyte) that helps regulate the amount of fluid in the body. It also stimulates muscle tightening (contraction) and helps nerves work properly. Normally, most of the body's potassium is inside cells, and only a very small amount is in the blood. Because the amount in the blood is so small, minor changes to potassium levels in the blood can be life-threatening. What are the causes? This condition may be caused by: Antibiotic medicine. Diarrhea or vomiting. Taking too much of a medicine that helps you have a bowel movement (laxative) can cause diarrhea and lead to hypokalemia. Chronic kidney disease (CKD). Medicines that help the body get rid of excess fluid (diuretics). Eating disorders, such as anorexia or bulimia. Low magnesium levels in the body. Sweating a lot. What are the signs or symptoms?  Symptoms of this condition include: Weakness. Constipation. Fatigue. Muscle cramps. Mental confusion. Skipped heartbeats or irregular heartbeat (palpitations). Tingling or numbness. How is this diagnosed? This condition is diagnosed with a blood test. How is this treated? This condition may be treated by: Taking potassium  supplements. Adjusting the medicines that you take. Eating more foods that contain a lot of potassium. If your potassium level is very low, you may need to get potassium through an IV and be monitored in the hospital. Follow these instructions at home: Eating and drinking  Eat a healthy diet. A healthy diet includes fresh fruits and vegetables, whole grains, healthy fats, and lean proteins. If told, eat more foods that contain a lot of potassium. These include: Nuts, such as peanuts and pistachios. Seeds, such as sunflower seeds and pumpkin seeds. Peas, lentils, and lima beans. Whole grain and bran cereals and breads. Fresh fruits and vegetables, such as apricots, avocado, bananas, cantaloupe, kiwi, oranges, tomatoes, asparagus, and potatoes. Juices, such as orange, tomato, and prune. Lean meats, including fish. Milk and milk products, such as yogurt. General instructions Take over-the-counter and prescription medicines only as told by your health care provider. This includes vitamins, natural food products, and supplements. Keep all follow-up visits. This is important. Contact a health care provider if: You have weakness that gets worse. You feel your heart pounding or racing. You vomit. You have diarrhea. You have diabetes and you have trouble keeping your blood sugar in your target range. Get help right away if: You have chest pain. You have shortness of breath. You have vomiting or diarrhea that lasts for more than 2 days. You faint. These symptoms may be an emergency. Get help right away. Call 911. Do not wait to see if the symptoms will go away. Do not drive yourself to the hospital. Summary Hypokalemia means that the amount of potassium in the blood is lower than normal. This condition is diagnosed with a blood test. Hypokalemia may be treated by taking potassium supplements, adjusting the medicines that you take, or eating more foods that are high in potassium. If your  potassium level is very low, you may need to get potassium through an IV and be monitored in the hospital. This information is not intended to replace advice given to you by your health care provider. Make sure you discuss any questions you have with your health care provider. Document Revised: 01/05/2021 Document Reviewed: 01/05/2021 Elsevier Patient Education  [place].

## 2021-11-13 ENCOUNTER — Ambulatory Visit: Payer: Medicare Other | Admitting: Internal Medicine

## 2021-11-24 ENCOUNTER — Inpatient Hospital Stay: Payer: Medicare Other

## 2021-11-24 ENCOUNTER — Inpatient Hospital Stay: Payer: Medicare Other | Admitting: Oncology

## 2021-11-26 ENCOUNTER — Other Ambulatory Visit: Payer: Self-pay | Admitting: Oncology

## 2021-11-27 ENCOUNTER — Other Ambulatory Visit: Payer: Self-pay

## 2021-11-29 ENCOUNTER — Ambulatory Visit (INDEPENDENT_AMBULATORY_CARE_PROVIDER_SITE_OTHER): Payer: Medicare Other | Admitting: Internal Medicine

## 2021-11-29 ENCOUNTER — Encounter: Payer: Self-pay | Admitting: Internal Medicine

## 2021-11-29 DIAGNOSIS — J449 Chronic obstructive pulmonary disease, unspecified: Secondary | ICD-10-CM

## 2021-11-29 DIAGNOSIS — J9611 Chronic respiratory failure with hypoxia: Secondary | ICD-10-CM | POA: Diagnosis not present

## 2021-11-29 NOTE — Assessment & Plan Note (Signed)
Rx 2lpm hs and prn daytime since 2014  -  06/13/2018   Walked on 2lpm x  3 laps @  approx 267ft each @ nl pace  stopped due to  Sob after each lap  s desat   - 05/11/2021   Walked on RA  x  1  lap(s) =  approx 250  ft  @ avg pace, stopped due to sob/ back pain with lowest 02 sats 95%     Advised goal of 02 rx at rest and with ex is sat > 90% and no need to push a lot higher so " Make sure you check your oxygen saturation  AT  your highest level of activity (not after you stop)   to be sure it stays over 90% and adjust  02 flow upward to maintain this level if needed but remember to turn it back to previous settings when you stop (to conserve your supply)".           Each maintenance medication was reviewed in detail including emphasizing most importantly the difference between maintenance and prns and under what circumstances the prns are to be triggered using an action plan format where appropriate.  Total time for H and P, chart review, counseling, reviewing hfa/neb /02 device(s) and generating customized AVS unique to this office visit / same day charting = 27 min

## 2021-11-29 NOTE — Progress Notes (Signed)
Subjective:    Patient ID: Brooke [name], female   DOB: 07-10-1945,    MRN: [id number]     Brief patient profile:  83 yowf quit smoking 2009 on 02 noct 2012 maint on symbicort 160 since aorund  2014 referred to pulmonary clinic 02/15/2016 by Dr   Kathryne Eriksson for RUL Lung mass dx with Stage IV nsc 11/01/16 in setting of GOLD IV copd      History of Present Illness  02/15/2016 [facility]st [facility] office visit/ Aalaiyah Yassin  GOLD IV copd/ symb 160 2bid maint  Chief Complaint  Patient presents with   Pulmonary Consult    referred by Dr. Kathryne Eriksson for COPD.  MMRC2 = can't walk a nl pace on a flat grade s sob but does fine slow and flat eg [employer] on 2lpm  New onset bloody mucus plugs November 11 2015 assoc with wt loss  02 2lpm hs and with walking / rare saba needed rec For cough > mucinex up to 1200 mg every 12 hours as needed Rec:  Fob but refused   11/01/16   CT directed bx >>>  Pos NSC LUL    Diagnosis:   Stage IV NSCLC, Squamous cell carcinoma of the left upper lobe.      Indication for treatment:  palliative        Radiation treatment dates:   11/08/2016 to 11/21/2016   Site/dose:   The Left lung was treated to 30 Gy in 10 fractions at 3 Gy per fraction.    Beams/energy:   3D // 10X, 6X    06/13/2018  f/u ov/Zurii Hewes re: GOLD IV / 02 hs and prn  Chief Complaint  Patient presents with   Shortness of Breath    Worse since the last visit.  Dyspnea:  Still doing [employer]  On 2lpm but not checking sats as rec with activity  Cough: dry worse day than noct Sleeping: falt bed / couple of pillows SABA use: helps some / uses once a day esp in am 02: 2lpm hs and prn  rec Plan A = Automatic = symbicort 160 Take 2 puffs first thing in am and then another 2 puffs about 12 hours later.  Work on inhaler technique: Plan B = Backup Only use your albuterol inhaler as a rescue medication Plan C = Crisis - only use your albuterol/ipatropium nebulizer if you first try Plan B and  it fails to help > ok to use the nebulizer up to every 4 hours but if start needing it regularly call for immediate appointment Please schedule a follow up visit in 3 months but call sooner if needed  - add chart review does not show alpha one screen done     11/30/220 televisit, new hoarse rec  gerd rx > ENT F/u > sq cell ca vc    Finishied RT April 20th 2021 in [place]    06/15/2021  f/u ov/[name] re: GOLD 4/02 dep maint on symb 160   Chief Complaint  Patient presents with   Follow-up    C/o sob not getting any better for 5 wks., energy decreased, cough-white, frothy,wheezing  Dyspnea:   100 ft with sats  2lpm stay above 90% per pt Cough: min white  Sleeping: sitting up mostly SABA use: duoneb  02: 2lpm prn  Persistent new Bilateral positional Mscp  x  2 weeks T 8 level but no spine pain but classic C shape symmetric pattern which is only worse on  one side vs the other in different positions  Rec Depomedrol 120 mg IM  Prilosec 20 x  2  = Pantoprazole (protonix) 40 mg  Take  30-60 min before first meal of the day and continue  Pepcid (famotidine)  20 mg after supper until return to office  I will be referring you  for orthopedic evaluation for your Thoracic spine fracture > nsaids eliminated pain  Please schedule a follow up visit in 3 months but call sooner if needed    08/01/2021  f/u ov/[name] re: GOLD 4 COPD /02    maint on symbcort 160 /  not taking ppi  Chief Complaint  Patient presents with   Follow-up    Pt states she has been coughing up yellow phlegm. States that her breathing has been worse since last visit.  Dyspnea:  50 ft limiit slow = MMRC3 = can't walk 100 yards even at a slow pace at a flat grade s stopping due to sob   Cough: has zpak  no better on day 4  and muciex / flutter valve helps Sleeping: > 45 degrees  SABA use: 3 per day, neb x 3  02: 2lpm 24/7 but 4lpm  Rec Plan A = Automatic = Always=    symbicort 160 Take 2 puffs first thing in am and then  another 2 puffs about 12 hours later.  Work on inhaler technique:   Plan B = Backup (to supplement plan A, not to replace it) Only use your albuterol inhaler as a rescue medication Plan C = Crisis (instead of Plan B but only if Plan B stops working) - only use your albuterol nebulizer if you first try Plan B and it fails to help  Depomedrol 120 mg IM  Pantoprazole (protonix) 40 mg   Take  30-60 min before first meal of the day and Pepcid (famotidine)  20 mg after supper until return to office Make sure you check your oxygen saturation  AT  your highest level of activity (not after you stop)       08/30/2021  f/u ov/[name] re: GOLD IV COPD /02 hs and prn  maint on symbicort 160/ [place] Chief Complaint  Patient presents with   Follow-up    Patient has lost more weight, wears 1.5 to 2 liters all the time. Patient feels like breathing is worse since last visit. Productive cough.  Dyspnea:  50 ft on  1.5 - 2lpm  Cough: just white mucus  Sleeping: 45 degrees  SABA use: lots of hfa and neb despite symbicort  02: none at rest   Positional posterior Cp x 5 months moves around in different patterns  "right now it's below the L scapula / non pleuritic, better with lidocaine already eval by Louanne Skye and told it was neuralgia  Rec Zpak  Depomedrol 80 mg IM today Medrol 4 mg  x 4 until better then 2 daily x 5 daily , 1 daily for 5 days and stop (if not better after a week then stop it)  Pantoprazole (protonix) 40 mg   Take  30-60 min before first meal of the day and Pepcid (famotidine)  20 mg after supper until return to office - this is the best way to tell whether stomach acid is contributing to your problem.   Make sure you check your oxygen saturation  AT  your highest level of activity (not after you stop)   to be sure it stays over 90%   Please schedule a follow  up visit in 3 months but call sooner if needed    11/29/2021  f/u ov/[name] re: GOLD IV copd/ 02 dep maint on Symbciort 160 2bid   Chief  Complaint  Patient presents with   Follow-up    No new problems.  Breathing is no worse than last visit.   Dyspnea:  3 laps around house 65 ft each x up 20 laps typically p neb and p 2nd lap feels still needs 02 at 2lpm to complete with sats then always > 90%   Cough: prod daily sputumyellow "nothing ever helps " Sleeping: 30 degrees with pillows  SABA use: 1-2 puffs hfa/ and neb avg twice daily  02: 1.5 - 2lpm most of the time  Covid status:   most of them    No obvious day to day or daytime variability or assoc excess/ purulent sputum or mucus plugs or hemoptysis or cp or chest tightness, subjective wheeze or overt sinus or hb symptoms.   Sleeping as above  without nocturnal  or early am exacerbation  of respiratory  c/o's or need for noct saba. Also denies any obvious fluctuation of symptoms with weather or environmental changes or other aggravating or alleviating factors except as outlined above   No unusual exposure hx or h/o childhood pna/ asthma or knowledge of premature birth.  Current Allergies, Complete Past Medical History, Past Surgical History, Family History, and Social History were reviewed in [employer] record.  ROS  The following are not active complaints unless bolded Hoarseness, sore throat, dysphagia, dental problems, itching, sneezing,  nasal congestion or discharge of excess mucus or purulent secretions, ear ache,   fever, chills, sweats, unintended wt loss or wt gain, classically pleuritic or exertional cp,  orthopnea pnd or arm/hand swelling  or leg swelling, presyncope, palpitations, abdominal pain, anorexia, nausea, vomiting, diarrhea  or change in bowel habits or change in bladder habits, change in stools or change in urine, dysuria, hematuria,  rash, arthralgias, visual complaints, headache, numbness, weakness or ataxia or problems with walking or coordination,  change in mood or  memory.        Current Meds  Medication Sig   acetaminophen  (TYLENOL) 325 MG tablet Per bottle as needed   albuterol (VENTOLIN HFA) 108 (90 Base) MCG/ACT inhaler Inhale into the lungs.   ALPRAZolam (XANAX) 0.25 MG tablet Take 0.5-1 tablets by mouth 2 (two) times daily as needed.   amLODipine (NORVASC) 2.5 MG tablet TAKE 1 TABLET IF BLOOD PRESSURE(140-159), 2 TABLETS(160-179), 3 TABLETS FOR BLOOD PRESSURE GREATER THAN 180   atorvastatin (LIPITOR) 20 MG tablet Take 2 tablets (40 mg total) by mouth daily. NEEDS 20 MG TABLETS SECONDARY TO SWALLOWING ISSUES   budesonide-formoterol (SYMBICORT) 160-4.5 MCG/ACT inhaler Inhale 2 puffs into the lungs 2 (two) times daily.   calcium citrate (CALCITRATE - DOSED IN MG ELEMENTAL CALCIUM) 950 (200 Ca) MG tablet Take 1 tablet (200 mg of elemental calcium total) by mouth daily.   docusate sodium (COLACE) 100 MG capsule Take 100 mg by mouth daily.   gabapentin (NEURONTIN) 100 MG capsule Take 1 capsule (100 mg total) by mouth 2 (two) times daily.   guaiFENesin (MUCINEX) 600 MG 12 hr tablet Take 600 mg by mouth 2 (two) times daily as needed for cough or to loosen phlegm.   ipratropium-albuterol (DUONEB) 0.5-2.5 (3) MG/3ML SOLN Take 3 mLs by nebulization every 4 (four) hours as needed (wheezing/shortness of breath). **PLAN C**   nitroGLYCERIN (NITROSTAT) 0.4 MG SL tablet ONE  TABLET UNDER TONGUE AS NEEDED FOR CHEST PAIN EVERY 5 MINUTES FOR 3 DOSES   NON FORMULARY [facility]  Onychomycosis Nail Lacquer -  Fluconazole 2%, Terbinafine 1% DMSO/undecylenic acid 25% Apply to affected nail once daily Qty. 120 gm 3 refills   OVER THE COUNTER MEDICATION Aspercream as needed   OXYGEN 2lpm 24/7   pantoprazole (PROTONIX) 40 MG tablet Take 1 tablet (40 mg total) by mouth daily. Take 30-60 min before first meal of the day   polyethylene glycol (MIRALAX / GLYCOLAX) 17 g packet Take 17 g by mouth daily.   potassium chloride (KLOR-CON M) 10 MEQ tablet Take 1 tablet (10 mEq total) by mouth daily.   Probiotic Product (PROBIOTIC ADVANCED  PO) Take 1 tablet by mouth daily.   Simethicone 180 MG CAPS Use as needed   Vitamin D, Ergocalciferol, (DRISDOL) 1.25 MG (50000 UNIT) CAPS capsule Take 1 capsule (50,000 Units total) by mouth every 7 (seven) days.                Objective:   Physical Exam  Wts  11/29/2021      83  08/30/2021      89  08/01/2021      88  06/15/2021        89  05/11/2021        92 03/20/2021    96   09/12/2020         96 06/06/2020        100 01/18/2020          92 10/16/2019          93  03/05/2019      99  06/13/2018         123  03/14/2018       127  12/09/2017         125   06/10/2017         119  01/08/2017         112   12/06/2016        110   02/15/16 119 lb (54 kg)  08/18/15 137 lb 12.8 oz (62.5 kg)  02/17/15 138 lb 1.9 oz (62.7 kg)     Vital signs reviewed  11/29/2021  - Note at rest 02 sats  100% on 1.5 POC    General appearance:    eldelry wf,  chronically ill/ sitting on exam table cross legged but came in w/c    HEENT :  Oropharynx  clear  Nasal turbinates nl    NECK :  without JVD/Nodes/TM/ nl carotid upstrokes bilaterally   LUNGS: no acc muscle use,  Mod barrel  contour chest wall with bilateral  Distant bs s audible wheeze and  without cough on insp or exp maneuvers and mod  Hyperresonant  to  percussion bilaterally     CV:  RRR  no s3 or murmur or increase in P2, and no edema   ABD:  soft and nontender with pos mid insp Hoover's  in the supine position. No bruits or organomegaly appreciated, bowel sounds nl  MS:   Ext warm without deformities or   obvious joint restrictions , calf tenderness, cyanosis or clubbing  SKIN: warm and dry without lesions    NEURO:  alert, approp, nl sensorium with  no motor or cerebellar deficits apparent.           Assessment:

## 2021-11-29 NOTE — Patient Instructions (Addendum)
Work on inhaler technique:  relax and gently blow all the way out then take a nice smooth full deep breath back in, triggering the inhaler at same time you start breathing in.  Hold breath in for at least  5 seconds if you can. Blow out symbicort  thru nose. Rinse and gargle with water when done.  If mouth or throat bother you at all,  try brushing teeth/gums/tongue with arm and hammer toothpaste/ make a slurry and gargle and spit out.   If breathing worse and you need more than usual, activate action plan  Medrol 4 mg  x 4 until better then 2 daily x 5 daily , 1 daily for 5 days and stop    Please schedule a follow up visit in 3 months but call sooner if needed

## 2021-11-29 NOTE — Assessment & Plan Note (Signed)
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

## 2021-12-01 ENCOUNTER — Encounter: Payer: Self-pay | Admitting: Nurse Practitioner

## 2021-12-01 ENCOUNTER — Inpatient Hospital Stay: Payer: Medicare Other

## 2021-12-01 ENCOUNTER — Other Ambulatory Visit: Payer: Self-pay | Admitting: *Deleted

## 2021-12-01 ENCOUNTER — Inpatient Hospital Stay (HOSPITAL_BASED_OUTPATIENT_CLINIC_OR_DEPARTMENT_OTHER): Payer: Medicare Other | Admitting: Nurse Practitioner

## 2021-12-01 ENCOUNTER — Encounter: Payer: Self-pay | Admitting: *Deleted

## 2021-12-01 VITALS — BP 159/87 | HR 79 | Temp 98.1°F | Resp 18 | Ht 63.0 in | Wt 83.0 lb

## 2021-12-01 DIAGNOSIS — Z5112 Encounter for antineoplastic immunotherapy: Secondary | ICD-10-CM | POA: Diagnosis not present

## 2021-12-01 DIAGNOSIS — C3412 Malignant neoplasm of upper lobe, left bronchus or lung: Secondary | ICD-10-CM | POA: Diagnosis not present

## 2021-12-01 LAB — CMP (CANCER CENTER ONLY)
ALT: 23 U/L (ref 0–44)
AST: 23 U/L (ref 15–41)
Albumin: 4.1 g/dL (ref 3.5–5.0)
Alkaline Phosphatase: 45 U/L (ref 38–126)
Anion gap: 9 (ref 5–15)
BUN: 12 mg/dL (ref 8–23)
CO2: 30 mmol/L (ref 22–32)
Calcium: 9.1 mg/dL (ref 8.9–10.3)
Chloride: 99 mmol/L (ref 98–111)
Creatinine: 0.37 mg/dL — ABNORMAL LOW (ref 0.44–1.00)
GFR, Estimated: >60 mL/min (ref >60)
Glucose, Bld: 89 mg/dL (ref 70–99)
Potassium: 3.6 mmol/L (ref 3.5–5.1)
Sodium: 138 mmol/L (ref 135–145)
Total Bilirubin: 0.4 mg/dL (ref 0.3–1.2)
Total Protein: 6 g/dL — ABNORMAL LOW (ref 6.5–8.1)

## 2021-12-01 MED ORDER — SODIUM CHLORIDE 0.9 % IV SOLN: Freq: Once | INTRAVENOUS | Status: AC

## 2021-12-01 MED ORDER — SODIUM CHLORIDE 0.9 % IV SOLN
200.0000 mg | Freq: Once | INTRAVENOUS | Status: AC
  Administered 2021-12-01: 200 mg via INTRAVENOUS
  Filled 2021-12-01: qty 8

## 2021-12-01 NOTE — Progress Notes (Signed)
[facility] OFFICE PROGRESS NOTE   Diagnosis: Non-small cell lung cancer  INTERVAL HISTORY:   Brooke [name] returns as scheduled.  She completed a cycle of Pembrolizumab 11/10/2021.  No rash.  She continues to note a change in bowel habits and is also now having intermittent pain at the left upper abdomen.  With a stool softener and MiraLAX she has a bowel movement 4-5 times a day, volume varies.  No bleeding.  Objective:  Vital signs in last 24 hours:  Blood pressure (!) 159/87, pulse 79, temperature 98.1 F (36.7 C), temperature source Oral, resp. rate 18, height '5\' 3"'  (1.6 m), weight 83 lb (37.6 kg), SpO2 100 %.    HEENT: No thrush or ulcers. Resp: Distant breath sounds. Cardio: Regular rate and rhythm. GI: Abdomen soft.  No hepatosplenomegaly.  Tender at the left upper abdomen.  No mass. Vascular: No leg edema. Neuro: Alert and oriented. Skin: No rash.   Lab Results:  Lab Results  Component Value Date   WBC 5.6 10/13/2021   HGB 13.3 10/13/2021   HCT 41.1 10/13/2021   MCV 90.9 10/13/2021   PLT 207 10/13/2021   NEUTROABS 4.6 10/13/2021    Imaging:  No results found.  Medications: I have reviewed the patient's current medications.  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% Left lung radiation 11/08/2016 through 11/21/2016 CT chest 12/12/2016-reexpansion of left upper lobe with a decreased left upper lobe mass, unchanged mediastinal adenopathy, progression of a splenic metastasis/pancreatic tail/gastrohepatic ligament metastasis,  right lower lobe pneumonia Cycle 1 Pembrolizumab 12/14/2016 Cycle 2 Pembrolizumab 01/03/2017 Cycle 3 Pembrolizumab 01/24/2017 Cycle 4 Pembrolizumab 02/12/2017 CT 03/05/2018-decrease in left upper lobe mass, mediastinal adenopathy, and splenic mass Cycle 5 pembrolizumab 03/07/2017 Cycle 6 Pembrolizumab 04/02/2017 Cycle 7 pembrolizumab 04/25/2017 Cycle 8 Pembrolizumab 05/14/2017 Cycle 9 Pembrolizumab 06/06/2017 CT 06/20/2017-mild decrease in left upper lobe mass, mild increase in paramediastinal left upper lobe nodule-radiation change?, decreased splenic metastasis Cycle 10 pembrolizumab 06/25/2017 Cycle 11 pembrolizumab 07/18/2017 Cycle 12 pembrolizumab 08/29/2017 Cycle 13 pembrolizumab 09/17/2017 Cycle 14 Pembrolizumab 10/10/2017 CT chest 10/24/2017- slight enlargement of small mediastinal lymph nodes, increased left upper lobe consolidation, decreased size of spleen lesion Cycle 15 pembrolizumab 10/29/2017 Cycle 16 Pembrolizumab 11/21/2017 Cycle 17 Pembrolizumab 12/11/2017 Cycle 18 pembrolizumab 01/02/2018 Cycle 19 Pembrolizumab 01/21/2018 Cycle 20 Pembrolizumab 02/13/2018 CT chest 02/27/2018- decreased left paratracheal node, progressive consolidation/fibrosis in the left upper lung, decreased size of splenic mass Cycle 21 pembrolizumab 03/04/2018 Cycle 22 Pembrolizumab 03/27/2018  Cycle 23 pembrolizumab 04/15/2018 Cycle 24 pembrolizumab 05/08/2018 Cycle 25 Pembrolizumab 05/27/2018 CT chest 06/16/2018- stable left upper lung radiation fibrosis with no evidence of local tumor recurrence.  Decreased left paratracheal adenopathy.  No new or progressive metastatic disease in the chest.  Gastrohepatic ligament lymphadenopathy stable.  Splenic metastasis decreased.  T5 vertebral compression fracture with associated patchy sclerosis and no discrete osseous lesion, new in the interval. Cycle 26 Pembrolizumab 06/19/2018 Cycle 27 pembrolizumab 07/08/2018 Cycle 28 pembrolizumab 07/31/2018 Cycle 29 pembrolizumab  08/19/2018 Cycle 30 Pembrolizumab 09/11/2018 Cycle 31 pembrolizumab 09/30/2018 Cycle 32 pembrolizumab 10/23/2018 CT chest 10/28/2018- left apical pleural-parenchymal opacity consistent with radiation scarring has become more confluent likely representing evolutionary change.  Continued  further decrease in size of index left paratracheal node and splenic metastasis.  Gastrohepatic ligament lymph node not changed. Cycle 33 Pembrolizumab 11/11/2018 Cycle 34 pembrolizumab 12/04/2018 Cycle 35 pembrolizumab 12/23/2018 CT chest 02/12/2019- posttreatment changes left upper lobe unchanged.  Continued decrease in size of splenic metastasis.  Stable appearance of left paratracheal lymph nodes.  Stable gastrohepatic adenopathy.  Incomplete visualization of retroperitoneal lymph nodes in the upper abdomen. CT chest 07/02/2019-stable post treatment related changes left lung.  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  Slight regression of enlarged likely metastatic gastrohepatic lymph node. CT chest 11/11/2019-interval enlargement of a right paratracheal lymph node measuring 14 mm, increased from 5 mm.  No new lung mass or nodularity.  New adenopathy in the upper abdomen.  2.7 cm lesion gastrohepatic ligament.  Necrotic node at the splenic hilum measures 4.7 cm.  Large node along the IVC left upper quadrant measures 2.7 cm.  Similar node left adjacent the aorta measures 1.8 cm.  Enhancing lesion in the spleen is unchanged.  Several low-density lesions in the liver are unchanged. Pembrolizumab resumed on a 3-week schedule 12/03/2019 CT chest 02/22/2020-interval resolution of previously demonstrated left paratracheal and upper abdominal adenopathy.  Stable treated metastasis within the spleen.  Stable radiation changes in the left lung with left hilar distortion.  No evidence of local recurrence or progressive metastatic disease. Continuation of pembrolizumab every 3 weeks 02/26/2020 CT chest 06/28/2020-no change in left upper lobe  consolidation, unchanged spleen metastasis, no evidence of progressive disease Pembrolizumab continued every 3 weeks 07/01/2020 CT chest 11/02/2020-stable posttreatment changes at the left hilum and left upper lobe, decreased size of spleen lesion, no evidence of disease progression Pembrolizumab every 3 weeks continued CT chest 01/12/2021-negative for acute pulmonary embolus.  Stable radiation fibrosis left upper lobe.  Stable low-density lesion in the spleen.  No new or progressive findings. 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  Splenic lesion stable.  No upper abdominal lymphadenopathy. Pembrolizumab every 3 weeks continued CT chest 08/29/2021-unchanged consolidation/fibrosis at the left upper lung, no evidence of metastatic disease in the chest, COPD, multiple compression fractures, new T11 compression fracture, stable spleen lesion by my review Pembrolizumab every 3 weeks continued Treatment held 10/13/2021 for diarrhea, C. difficile negative Pembrolizumab 11/10/2021 Pembrolizumab 12/01/2021     2.  05/29/2019 laryngoscopy-exophytic appearing mass mid right vocal cord.   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  Tumor cells negative for p16, CMV and HSV 2.  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 Radiation 07/29/2019-08/25/2019   3. History of acute respiratory failure secondary to #1   4. Oxygen dependent COPD   5. History of a NSTEMI 2015   6. Right lung pneumonia on chest CT 12/12/2016-treated with Levaquin   7.  Grade 2 rash possibly related to Pembrolizumab.  Treatment held 08/06/2017.  Improved 08/29/2017, treatment resumed, rash has resolved   8.  Vertigo January 2022-improved with course of Augmentin  for ear/sinus infection   9.  Diarrhea beginning early June 2023    Disposition: Ms. Wrobleski appears unchanged.  Plan to proceed with Pembrolizumab today as scheduled.  Plan for restaging chest CT prior to next office visit.  She can continues to experience bowel issues with a change in bowel habits over the past few months and  now pain at the left upper abdomen.  This is not likely related to the Pembrolizumab.  Plan for CT abdomen/pelvis at time of above chest CT.  She will return as scheduled in 3 weeks.  We are available to see her sooner if needed.    [name] ANP/GNP-BC   12/01/2021  10:00 AM

## 2021-12-01 NOTE — Patient Instructions (Signed)
[facility]  Discharge Instructions: Thank you for choosing [facility] to provide your oncology and hematology care.   If you have a lab appointment with the [facility], please go directly to the [facility] and check in at the registration area.   Wear comfortable clothing and clothing appropriate for easy access to any Portacath or PICC line.   We strive to give you quality time with your provider. You may need to reschedule your appointment if you arrive late (15 or more minutes).  Arriving late affects you and other patients whose appointments are after yours.  Also, if you miss three or more appointments without notifying the office, you may be dismissed from the clinic at the provider's discretion.      For prescription refill requests, have your pharmacy contact our office and allow 72 hours for refills to be completed.    Today you received the following chemotherapy and/or immunotherapy agents Keytruda      To help prevent nausea and vomiting after your treatment, we encourage you to take your nausea medication as directed.  BELOW ARE SYMPTOMS THAT SHOULD BE REPORTED IMMEDIATELY: *FEVER GREATER THAN 100.4 F (38 C) OR HIGHER *CHILLS OR SWEATING *NAUSEA AND VOMITING THAT IS NOT CONTROLLED WITH YOUR NAUSEA MEDICATION *UNUSUAL SHORTNESS OF BREATH *UNUSUAL BRUISING OR BLEEDING *URINARY PROBLEMS (pain or burning when urinating, or frequent urination) *BOWEL PROBLEMS (unusual diarrhea, constipation, pain near the anus) TENDERNESS IN MOUTH AND THROAT WITH OR WITHOUT PRESENCE OF ULCERS (sore throat, sores in mouth, or a toothache) UNUSUAL RASH, SWELLING OR PAIN  UNUSUAL VAGINAL DISCHARGE OR ITCHING   Items with * indicate a potential emergency and should be followed up as soon as possible or go to the Emergency Department if any problems should occur.  Please show the CHEMOTHERAPY ALERT CARD or IMMUNOTHERAPY ALERT CARD at check-in to the  Emergency Department and triage nurse.  Should you have questions after your visit or need to cancel or reschedule your appointment, please contact [facility]  Dept: [phone]  and follow the prompts.  Office hours are 8:00 a.m. to 4:30 p.m. Monday - Friday. Please note that voicemails left after 4:00 p.m. may not be returned until the following business day.  We are closed weekends and major holidays. You have access to a nurse at all times for urgent questions. Please call the main number to the clinic Dept: [phone] and follow the prompts.   For any non-urgent questions, you may also contact your provider using MyChart. We now offer e-Visits for anyone 40 and older to request care online for non-urgent symptoms. For details visit [email or web address].   Also download the MyChart app! Go to the app store, search "MyChart", open the app, select [facility], and log in with your MyChart username and password.  Masks are optional in the cancer centers. If you would like for your care team to wear a mask while they are taking care of you, please let them know. For doctor visits, patients may have with them one support person who is at least 76 years old. At this time, visitors are not allowed in the infusion area.

## 2021-12-01 NOTE — Progress Notes (Signed)
Patient seen by [name] NP today  Vitals are within treatment parameters.  Labs reviewed by [name] NP and are within treatment parameters. CBC not required per NP.  Per physician team, patient is ready for treatment and there are NO modifications to the treatment plan.

## 2021-12-01 NOTE — Progress Notes (Signed)
Added labs for next tx

## 2021-12-05 ENCOUNTER — Other Ambulatory Visit: Payer: Self-pay

## 2021-12-07 ENCOUNTER — Encounter (HOSPITAL_BASED_OUTPATIENT_CLINIC_OR_DEPARTMENT_OTHER): Payer: Self-pay

## 2021-12-07 ENCOUNTER — Ambulatory Visit (HOSPITAL_BASED_OUTPATIENT_CLINIC_OR_DEPARTMENT_OTHER)
Admission: RE | Admit: 2021-12-07 | Discharge: 2021-12-07 | Disposition: A | Discharge status: Home / Self Care | Payer: Medicare Other | Source: Ambulatory Visit | Attending: Nurse Practitioner | Admitting: Nurse Practitioner

## 2021-12-07 DIAGNOSIS — Z5112 Encounter for antineoplastic immunotherapy: Secondary | ICD-10-CM | POA: Insufficient documentation

## 2021-12-07 DIAGNOSIS — C3412 Malignant neoplasm of upper lobe, left bronchus or lung: Secondary | ICD-10-CM | POA: Diagnosis not present

## 2021-12-07 MED ORDER — IOHEXOL 300 MG/ML  SOLN
100.0000 mL | Freq: Once | INTRAMUSCULAR | Status: AC | PRN
  Administered 2021-12-07: 75 mL via INTRAVENOUS

## 2021-12-08 ENCOUNTER — Telehealth: Payer: Self-pay

## 2021-12-08 ENCOUNTER — Other Ambulatory Visit: Payer: Self-pay | Admitting: Nurse Practitioner

## 2021-12-08 DIAGNOSIS — C3412 Malignant neoplasm of upper lobe, left bronchus or lung: Secondary | ICD-10-CM

## 2021-12-08 NOTE — Telephone Encounter (Signed)
-----   Message from Owens Shark, NP sent at 12/08/2021  3:47 PM EDT ----- Please let her know the CT scan showed inflammation in the colon.  We are making a referral to GI.

## 2021-12-08 NOTE — Telephone Encounter (Signed)
Patient give verbal understanding and had no further questions or concerns.

## 2021-12-13 ENCOUNTER — Telehealth: Payer: Self-pay

## 2021-12-13 ENCOUNTER — Encounter: Payer: Self-pay | Admitting: Internal Medicine

## 2021-12-13 NOTE — Telephone Encounter (Signed)
[facility] confirms that this pt's referral has been received. Spoke to pt regarding [facility] referral. Pt given phone number to call and schedule an appointment [phone]). Pt verbalizes understanding

## 2021-12-17 ENCOUNTER — Other Ambulatory Visit: Payer: Self-pay | Admitting: Oncology

## 2021-12-21 ENCOUNTER — Other Ambulatory Visit: Payer: Self-pay

## 2021-12-22 ENCOUNTER — Inpatient Hospital Stay (HOSPITAL_BASED_OUTPATIENT_CLINIC_OR_DEPARTMENT_OTHER): Payer: Medicare Other | Admitting: Nurse Practitioner

## 2021-12-22 ENCOUNTER — Inpatient Hospital Stay: Payer: Medicare Other | Attending: Oncology

## 2021-12-22 ENCOUNTER — Encounter: Payer: Self-pay | Admitting: *Deleted

## 2021-12-22 ENCOUNTER — Encounter: Payer: Self-pay | Admitting: Nurse Practitioner

## 2021-12-22 ENCOUNTER — Inpatient Hospital Stay: Payer: Medicare Other

## 2021-12-22 VITALS — BP 146/76 | HR 73 | Temp 98.2°F | Resp 18 | Ht 63.0 in | Wt 83.0 lb

## 2021-12-22 DIAGNOSIS — J440 Chronic obstructive pulmonary disease with acute lower respiratory infection: Secondary | ICD-10-CM | POA: Diagnosis not present

## 2021-12-22 DIAGNOSIS — C7889 Secondary malignant neoplasm of other digestive organs: Secondary | ICD-10-CM | POA: Insufficient documentation

## 2021-12-22 DIAGNOSIS — Z79899 Other long term (current) drug therapy: Secondary | ICD-10-CM | POA: Diagnosis not present

## 2021-12-22 DIAGNOSIS — Z9981 Dependence on supplemental oxygen: Secondary | ICD-10-CM | POA: Diagnosis not present

## 2021-12-22 DIAGNOSIS — C3412 Malignant neoplasm of upper lobe, left bronchus or lung: Secondary | ICD-10-CM | POA: Diagnosis present

## 2021-12-22 DIAGNOSIS — Z5112 Encounter for antineoplastic immunotherapy: Secondary | ICD-10-CM

## 2021-12-22 DIAGNOSIS — Z86711 Personal history of pulmonary embolism: Secondary | ICD-10-CM | POA: Insufficient documentation

## 2021-12-22 DIAGNOSIS — M4854XA Collapsed vertebra, not elsewhere classified, thoracic region, initial encounter for fracture: Secondary | ICD-10-CM | POA: Diagnosis not present

## 2021-12-22 DIAGNOSIS — K529 Noninfective gastroenteritis and colitis, unspecified: Secondary | ICD-10-CM | POA: Diagnosis not present

## 2021-12-22 DIAGNOSIS — I252 Old myocardial infarction: Secondary | ICD-10-CM | POA: Diagnosis not present

## 2021-12-22 LAB — CMP (CANCER CENTER ONLY)
ALT: 35 U/L (ref 0–44)
AST: 29 U/L (ref 15–41)
Albumin: 4.1 g/dL (ref 3.5–5.0)
Alkaline Phosphatase: 68 U/L (ref 38–126)
Anion gap: 9 (ref 5–15)
BUN: 12 mg/dL (ref 8–23)
CO2: 31 mmol/L (ref 22–32)
Calcium: 9 mg/dL (ref 8.9–10.3)
Chloride: 102 mmol/L (ref 98–111)
Creatinine: 0.42 mg/dL — ABNORMAL LOW (ref 0.44–1.00)
GFR, Estimated: >60 mL/min (ref >60)
Glucose, Bld: 93 mg/dL (ref 70–99)
Potassium: 3 mmol/L — ABNORMAL LOW (ref 3.5–5.1)
Sodium: 142 mmol/L (ref 135–145)
Total Bilirubin: 0.5 mg/dL (ref 0.3–1.2)
Total Protein: 6.6 g/dL (ref 6.5–8.1)

## 2021-12-22 LAB — CBC WITH DIFFERENTIAL (CANCER CENTER ONLY)
Abs Immature Granulocytes: 0.01 10*3/uL (ref 0.00–0.07)
Basophils Absolute: 0 10*3/uL (ref 0.0–0.1)
Basophils Relative: 1 %
Eosinophils Absolute: 0.1 10*3/uL (ref 0.0–0.5)
Eosinophils Relative: 2 %
HCT: 39.9 % (ref 36.0–46.0)
Hemoglobin: 12.8 g/dL (ref 12.0–15.0)
Immature Granulocytes: 0 %
Lymphocytes Relative: 17 %
Lymphs Abs: 0.8 10*3/uL (ref 0.7–4.0)
MCH: 29.5 pg (ref 26.0–34.0)
MCHC: 32.1 g/dL (ref 30.0–36.0)
MCV: 91.9 fL (ref 80.0–100.0)
Monocytes Absolute: 0.4 10*3/uL (ref 0.1–1.0)
Monocytes Relative: 9 %
Neutro Abs: 3.1 10*3/uL (ref 1.7–7.7)
Neutrophils Relative %: 71 %
Platelet Count: 215 10*3/uL (ref 150–400)
RBC: 4.34 MIL/uL (ref 3.87–5.11)
RDW: 13.1 % (ref 11.5–15.5)
WBC Count: 4.4 10*3/uL (ref 4.0–10.5)
nRBC: 0 % (ref 0.0–0.2)

## 2021-12-22 LAB — TSH: TSH: 1.334 u[IU]/mL (ref 0.350–4.500)

## 2021-12-22 NOTE — Progress Notes (Signed)
Patient seen by [name] NP today  Vitals are within treatment parameters. Provider aware of SBP 146--no intervention necessary at this time  Labs reviewed by [name] NP and are not all within treatment parameters. K+ 3.0-- Having diarrhea (colitis).  Per physician team, patient will not be receiving treatment today.

## 2021-12-22 NOTE — Progress Notes (Signed)
[facility] OFFICE PROGRESS NOTE   Diagnosis: Non-small cell lung cancer  INTERVAL HISTORY:   [name] returns as scheduled.  She completed another cycle of Pembrolizumab 12/01/2021.  She continues to have frequent bowel movements, estimating 6 to 7/day.  Consistency ranges from liquid to formed.  No fever.  No rash.  She is concerned that her cousin had a GI illness and brought her food on 09/30/2021.  She thinks she may have what her cousin had causing the change in her bowel habits.  Objective:  Vital signs in last 24 hours:  Blood pressure (!) 146/76, pulse 73, temperature 98.2 F (36.8 C), temperature source Oral, resp. rate 18, height '5\' 3"'  (1.6 m), weight 83 lb (37.6 kg), SpO2 100 %.    HEENT: No thrush or ulcers. Resp: Distant breath sounds. Cardio: Regular rate and rhythm. GI: Abdomen soft and nontender.  No hepatosplenomegaly. Vascular: No edema. Skin: No rash.   Lab Results:  Lab Results  Component Value Date   WBC 4.4 12/22/2021   HGB 12.8 12/22/2021   HCT 39.9 12/22/2021   MCV 91.9 12/22/2021   PLT 215 12/22/2021   NEUTROABS 3.1 12/22/2021    Imaging:  No results found.  Medications: I have reviewed the patient's current medications.  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% Left lung radiation 11/08/2016 through 11/21/2016 CT chest 12/12/2016-reexpansion of left upper lobe with a decreased left upper lobe mass, unchanged mediastinal adenopathy, progression of a splenic metastasis/pancreatic tail/gastrohepatic  ligament metastasis, right lower lobe pneumonia Cycle 1 Pembrolizumab 12/14/2016 Cycle 2 Pembrolizumab 01/03/2017 Cycle 3 Pembrolizumab 01/24/2017 Cycle 4 Pembrolizumab 02/12/2017 CT 03/05/2018-decrease in left upper lobe mass, mediastinal adenopathy, and splenic mass Cycle 5 pembrolizumab 03/07/2017 Cycle 6 Pembrolizumab 04/02/2017 Cycle 7 pembrolizumab 04/25/2017 Cycle 8 Pembrolizumab 05/14/2017 Cycle 9 Pembrolizumab 06/06/2017 CT 06/20/2017-mild decrease in left upper lobe mass, mild increase in paramediastinal left upper lobe nodule-radiation change?, decreased splenic metastasis Cycle 10 pembrolizumab 06/25/2017 Cycle 11 pembrolizumab 07/18/2017 Cycle 12 pembrolizumab 08/29/2017 Cycle 13 pembrolizumab 09/17/2017 Cycle 14 Pembrolizumab 10/10/2017 CT chest 10/24/2017- slight enlargement of small mediastinal lymph nodes, increased left upper lobe consolidation, decreased size of spleen lesion Cycle 15 pembrolizumab 10/29/2017 Cycle 16 Pembrolizumab 11/21/2017 Cycle 17 Pembrolizumab 12/11/2017 Cycle 18 pembrolizumab 01/02/2018 Cycle 19 Pembrolizumab 01/21/2018 Cycle 20 Pembrolizumab 02/13/2018 CT chest 02/27/2018- decreased left paratracheal node, progressive consolidation/fibrosis in the left upper lung, decreased size of splenic mass Cycle 21 pembrolizumab 03/04/2018 Cycle 22 Pembrolizumab 03/27/2018  Cycle 23 pembrolizumab 04/15/2018 Cycle 24 pembrolizumab 05/08/2018 Cycle 25 Pembrolizumab 05/27/2018 CT chest 06/16/2018- stable left upper lung radiation fibrosis with no evidence of local tumor recurrence.  Decreased left paratracheal adenopathy.  No new or progressive metastatic disease in the chest.  Gastrohepatic ligament lymphadenopathy stable.  Splenic metastasis decreased.  T5 vertebral compression fracture with associated patchy sclerosis and no discrete osseous lesion, new in the interval. Cycle 26 Pembrolizumab 06/19/2018 Cycle 27 pembrolizumab 07/08/2018 Cycle 28 pembrolizumab 07/31/2018 Cycle  29 pembrolizumab 08/19/2018 Cycle 30 Pembrolizumab 09/11/2018 Cycle 31 pembrolizumab 09/30/2018 Cycle 32 pembrolizumab 10/23/2018 CT chest 10/28/2018- left apical pleural-parenchymal opacity consistent with radiation scarring has become more confluent likely representing evolutionary change.  Continued  further decrease in size of index left paratracheal node and splenic metastasis.  Gastrohepatic ligament lymph node not changed. Cycle 33 Pembrolizumab 11/11/2018 Cycle 34 pembrolizumab 12/04/2018 Cycle 35 pembrolizumab 12/23/2018 CT chest 02/12/2019- posttreatment changes left upper lobe unchanged.  Continued decrease in size of splenic metastasis.  Stable appearance of left paratracheal lymph nodes.  Stable gastrohepatic adenopathy.  Incomplete visualization of retroperitoneal lymph nodes in the upper abdomen. CT chest 07/02/2019-stable post treatment related changes left lung.  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  Slight regression of enlarged likely metastatic gastrohepatic lymph node. CT chest 11/11/2019-interval enlargement of a right paratracheal lymph node measuring 14 mm, increased from 5 mm.  No new lung mass or nodularity.  New adenopathy in the upper abdomen.  2.7 cm lesion gastrohepatic ligament.  Necrotic node at the splenic hilum measures 4.7 cm.  Large node along the IVC left upper quadrant measures 2.7 cm.  Similar node left adjacent the aorta measures 1.8 cm.  Enhancing lesion in the spleen is unchanged.  Several low-density lesions in the liver are unchanged. Pembrolizumab resumed on a 3-week schedule 12/03/2019 CT chest 02/22/2020-interval resolution of previously demonstrated left paratracheal and upper abdominal adenopathy.  Stable treated metastasis within the spleen.  Stable radiation changes in the left lung with left hilar distortion.  No evidence of local recurrence or progressive metastatic disease. Continuation of pembrolizumab every 3 weeks 02/26/2020 CT chest 06/28/2020-no change in  left upper lobe consolidation, unchanged spleen metastasis, no evidence of progressive disease Pembrolizumab continued every 3 weeks 07/01/2020 CT chest 11/02/2020-stable posttreatment changes at the left hilum and left upper lobe, decreased size of spleen lesion, no evidence of disease progression Pembrolizumab every 3 weeks continued CT chest 01/12/2021-negative for acute pulmonary embolus.  Stable radiation fibrosis left upper lobe.  Stable low-density lesion in the spleen.  No new or progressive findings. 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  Splenic lesion stable.  No upper abdominal lymphadenopathy. Pembrolizumab every 3 weeks continued CT chest 08/29/2021-unchanged consolidation/fibrosis at the left upper lung, no evidence of metastatic disease in the chest, COPD, multiple compression fractures, new T11 compression fracture, stable spleen lesion by my review Pembrolizumab every 3 weeks continued Treatment held 10/13/2021 for diarrhea, C. difficile negative Pembrolizumab 11/10/2021 Pembrolizumab 12/01/2021 CTs 12/07/2021-similar treatment effect within the left upper lobe including volume loss, traction bronchiectasis and consolidation.  No local recurrent or active metastatic disease.  Diffuse colitis most significant within the cecum.  New trace left pleural fluid.     2.  05/29/2019 laryngoscopy-exophytic appearing mass mid right vocal cord.   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  Tumor cells negative for p16, CMV and HSV 2.  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 Radiation 07/29/2019-08/25/2019   3. History of acute respiratory failure secondary to #1   4. Oxygen dependent COPD   5. History of a NSTEMI  2015   6. Right lung pneumonia on chest CT 12/12/2016-treated with Levaquin   7.  Grade 2 rash possibly related to Pembrolizumab.  Treatment held 08/06/2017.  Improved 08/29/2017, treatment resumed, rash has resolved   8.  Vertigo January 2022-improved with course of Augmentin for ear/sinus infection   9.  Diarrhea beginning early June 2023; negative C. difficile 10/13/2021; CTs 12/07/2021-diffuse colitis most significant within  the cecum.    Disposition: [name] appears unchanged.  She is currently on active treatment with Pembrolizumab.  Restaging CTs 12/07/2021 show no evidence for recurrent or active metastatic disease.    She noted a change in bowel habits beginning in early June 2023.  Stool negative for C. difficile 10/13/2021.  On the recent CT scans she was noted to have diffuse colitis and is now having diarrhea.  We decided to place Pembrolizumab on hold.  We discussed steroids which she prefers to hold for now due to a poor reaction to steroids in the past.  She is scheduled to see Dr. Henrene Pastor in about 3 weeks.  We will obtain a GI pathogen panel in the interim.  Labs from today reviewed.  She has hypokalemia.  She has not been taking oral potassium.  She will resume as previously prescribed.  We scheduled a return visit here 01/26/2022.  She understands to contact the office in the interim with any problems.  Patient seen with [name].    [name] ANP/GNP-BC   12/22/2021  10:13 AM This was a shared visit with [name].  We discussed the restaging CT findings, with Ms. Mcclish.  She has persistent diarrhea.  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  We referred her to gastroenterology to consider a colonoscopy to further evaluate the CT findings and diarrhea.  Pembrolizumab will be placed on hold for now.  There is no clinical or radiologic evidence of progressive  lung cancer.  She declined a trial of steroids for treatment of potential PD-1 inhibitor related colitis.  I was present for greater than 50% of today's visit.  I performed medical decision making.  Julieanne Manson, MD

## 2021-12-24 LAB — GI PATHOGEN PANEL BY PCR, STOOL

## 2021-12-25 ENCOUNTER — Telehealth: Payer: Self-pay

## 2021-12-25 NOTE — Telephone Encounter (Signed)
I called and spoke with the patient and explain Norovirus. We also went through some prevent Norovirus (What she need to know) - Wash hands well with soap and water -Clean and disinfect surfaces with bleach  - Wash laundry in hot water. Patient expresses understanding and had no questions or concerns.

## 2021-12-25 NOTE — Telephone Encounter (Signed)
-----   Message from Owens Shark, NP sent at 12/25/2021  1:53 PM EDT ----- Please let her know the stool panel returned positive for norovirus.  This can cause prolonged diarrhea in some cases.  She can try Imodium.

## 2022-01-10 ENCOUNTER — Other Ambulatory Visit: Payer: Self-pay | Admitting: Oncology

## 2022-01-11 ENCOUNTER — Encounter (HOSPITAL_BASED_OUTPATIENT_CLINIC_OR_DEPARTMENT_OTHER): Payer: Self-pay

## 2022-01-11 DIAGNOSIS — J449 Chronic obstructive pulmonary disease, unspecified: Secondary | ICD-10-CM | POA: Diagnosis not present

## 2022-01-12 ENCOUNTER — Other Ambulatory Visit: Payer: Medicare Other

## 2022-01-12 ENCOUNTER — Ambulatory Visit: Payer: Self-pay | Admitting: Nurse Practitioner

## 2022-01-12 ENCOUNTER — Ambulatory Visit: Payer: Self-pay

## 2022-01-15 ENCOUNTER — Encounter: Payer: Self-pay | Admitting: Internal Medicine

## 2022-01-15 ENCOUNTER — Ambulatory Visit (INDEPENDENT_AMBULATORY_CARE_PROVIDER_SITE_OTHER): Payer: Medicare Other | Admitting: Internal Medicine

## 2022-01-15 VITALS — BP 118/76 | HR 82 | Ht 63.0 in | Wt 84.0 lb

## 2022-01-15 DIAGNOSIS — R935 Abnormal findings on diagnostic imaging of other abdominal regions, including retroperitoneum: Secondary | ICD-10-CM | POA: Diagnosis not present

## 2022-01-15 DIAGNOSIS — R197 Diarrhea, unspecified: Secondary | ICD-10-CM | POA: Diagnosis not present

## 2022-01-15 DIAGNOSIS — R899 Unspecified abnormal finding in specimens from other organs, systems and tissues: Secondary | ICD-10-CM

## 2022-01-15 NOTE — Patient Instructions (Signed)
_______________________________________________________  If you are age 76 or older, your body mass index should be between 23-30. Your Body mass index is 14.88 kg/m. If this is out of the aforementioned range listed, please consider follow up with your Primary Care Provider.  If you are age 74 or younger, your body mass index should be between 19-25. Your Body mass index is 14.88 kg/m. If this is out of the aformentioned range listed, please consider follow up with your Primary Care Provider.   ________________________________________________________  The [place] [facility] providers would like to encourage you to use [facility] to communicate with providers for non-urgent requests or questions.  Due to long hold times on the telephone, sending your provider a message by [facility] may be a faster and more efficient way to get a response.  Please allow 48 business hours for a response.  Please remember that this is for non-urgent requests.  _______________________________________________________  Stop Colace Stop Miralax  Take Citrucel over the counter - 2 tablespoons in water of juice daily

## 2022-01-15 NOTE — Progress Notes (Signed)
HISTORY OF PRESENT ILLNESS:  Brooke [name] is a 76 y.o. 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  Patient sent today regarding problems with diarrhea and abnormal CT scan.  The patient developed problems with diarrhea in early June 2023.  She was concerned that this may have been the result of a fruit salad that she consumed in late May.  Previously she had issues with constipation for which she had been on MiraLAX and Colace.  Issues with diarrhea persisted.  Testing for C. difficile was negative.  She eventually underwent CT scan of the abdomen and pelvis December 07, 2021.  She was found to have a nonspecific diffuse colitis with the most significant changes in the cecum.  At the time of her last oncology visit (December 22, 2021) it was decided to hold Keytruda over concerns of immunotherapy induced colitis.  The patient is new to this office.  She is accompanied by her caregiver.  She tells me that she has been having about 4 bowel movements per day.  Generally loose.  No incontinence.  She did have a somewhat formed bowel movement yesterday, for the first time.  She is not taking antidiarrheals.  Not taking MiraLAX.  She is taking Colace.  GI pathogen panel was ordered and returned positive for norovirus.  Patient has focal left-sided abdominal discomfort, but otherwise no abdominal pain, nausea, vomiting, blood per rectum, or mucus.  No fevers.  REVIEW OF SYSTEMS:  All non-GI ROS negative unless otherwise stated in the HPI except for vertigo, weakness, shortness of breath  Past Medical History:  Diagnosis Date   Anxiety    Asthenia    CAD in native artery 03/16/2021   Chronic respiratory  failure ([place])    a. on home O2.   COPD (chronic obstructive pulmonary disease) ([place])    a. Home O2.   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 Coronary vasospasm   Pneumonia     Past Surgical History:  Procedure Laterality Date   ANGIOPLASTY     LEFT HEART CATHETERIZATION WITH CORONARY ANGIOGRAM N/A 06/08/2013   Procedure: LEFT HEART CATHETERIZATION WITH CORONARY ANGIOGRAM;  Surgeon: Sanda Klein, MD;  Location: [place] CATH LAB;  Service: Cardiovascular;  Laterality: N/A;    Social History Brooke [name]  reports that she quit smoking about 14 years ago. Her smoking use included cigarettes. She has a 30.00 pack-year smoking history. She has never used smokeless tobacco. She reports that she does not currently use alcohol. She reports that she does not use drugs.  family history includes Lung cancer in her father.  Allergies  Allergen Reactions   Afrin [Oxymetazoline] Anxiety   Pulmicort [Budesonide] Other (See Comments)    "feeling of torture"   Sulfa Antibiotics Other (See Comments)    Hallucinations, decreased appetite   Nitrofuran Derivatives    Codeine Nausea And Vomiting   Imdur [Isosorbide Dinitrate]  Other (See Comments)    headache   Prednisone Other (See Comments)    Reaction:Abnormal behavior; cannot take in pill form but CAN tolerate the injection       PHYSICAL EXAMINATION: Vital signs: BP 118/76   Pulse 82   Ht 5' 3" (1.6 m)   Wt 84 lb (38.1 kg)   BMI 14.88 kg/m   Constitutional: Chronically ill-appearing, thin, nasal cannula oxygen in place, no acute distress Psychiatric: alert and oriented x 3, cooperative Eyes:  extraocular movements intact, anicteric, conjunctiva pink Mouth: oral pharynx moist, no lesions Neck: supple no lymphadenopathy Cardiovascular: heart regular rate and rhythm, no murmur Lungs: clear to auscultation bilaterally Abdomen: soft, mild tenderness left lower quadrant, nondistended, no obvious ascites, no peritoneal signs, normal bowel sounds, no organomegaly Rectal: Omitted Extremities: no clubbing, cyanosis, or lower extremity edema bilaterally Skin: no lesions on visible extremities Neuro: No focal deficits.  Cranial nerves intact  ASSESSMENT:  1.  74-monthhistory of diarrhea.  CT scan shows changes consistent with colitis.  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  Negative testing for C. difficile.  Positive norovirus testing noted.  I doubt this (norovirus) explains her chronic diarrhea or CT findings (norovirus affects the small intestine).  At this point she is not toxic.  I would favor ongoing observation off [facility]  One could consider steroids for a more symptomatic patient.  Would hold on steroids for now.   PLAN:  1.  Stop Colace 2.  Start Citrucel 2 tablespoons daily.  This may help bulk up stools 3.  Continue to hold Keytruda 4.  GI office follow-up 4 weeks A total time of 60 minutes was spent preparing to see the patient, reviewing the myriad of records, laboratories, and x-rays.  Obtaining comprehensive history, performing comprehensive physical examination, counseling and educating the patient and her caregiver regarding the above listed issues, ordering medical therapy and follow-up, and documenting clinical information in the health record

## 2022-01-21 ENCOUNTER — Other Ambulatory Visit: Payer: Self-pay | Admitting: Oncology

## 2022-01-23 ENCOUNTER — Other Ambulatory Visit: Payer: Self-pay | Admitting: *Deleted

## 2022-01-23 DIAGNOSIS — C3412 Malignant neoplasm of upper lobe, left bronchus or lung: Secondary | ICD-10-CM

## 2022-01-26 ENCOUNTER — Inpatient Hospital Stay: Payer: Medicare Other | Attending: Oncology

## 2022-01-26 ENCOUNTER — Inpatient Hospital Stay: Payer: Medicare Other

## 2022-01-26 ENCOUNTER — Inpatient Hospital Stay (HOSPITAL_BASED_OUTPATIENT_CLINIC_OR_DEPARTMENT_OTHER): Payer: Medicare Other | Admitting: Oncology

## 2022-01-26 VITALS — BP 158/77 | HR 78 | Temp 98.1°F | Resp 18 | Ht 63.0 in | Wt 83.2 lb

## 2022-01-26 DIAGNOSIS — C3412 Malignant neoplasm of upper lobe, left bronchus or lung: Secondary | ICD-10-CM | POA: Diagnosis not present

## 2022-01-26 DIAGNOSIS — J449 Chronic obstructive pulmonary disease, unspecified: Secondary | ICD-10-CM | POA: Insufficient documentation

## 2022-01-26 DIAGNOSIS — Z9981 Dependence on supplemental oxygen: Secondary | ICD-10-CM | POA: Insufficient documentation

## 2022-01-26 DIAGNOSIS — Z86711 Personal history of pulmonary embolism: Secondary | ICD-10-CM | POA: Insufficient documentation

## 2022-01-26 LAB — CBC WITH DIFFERENTIAL (CANCER CENTER ONLY)
Abs Immature Granulocytes: 0.01 10*3/uL (ref 0.00–0.07)
Basophils Absolute: 0 10*3/uL (ref 0.0–0.1)
Basophils Relative: 0 %
Eosinophils Absolute: 0.1 10*3/uL (ref 0.0–0.5)
Eosinophils Relative: 2 %
HCT: 38.4 % (ref 36.0–46.0)
Hemoglobin: 12.4 g/dL (ref 12.0–15.0)
Immature Granulocytes: 0 %
Lymphocytes Relative: 18 %
Lymphs Abs: 0.8 10*3/uL (ref 0.7–4.0)
MCH: 29.5 pg (ref 26.0–34.0)
MCHC: 32.3 g/dL (ref 30.0–36.0)
MCV: 91.4 fL (ref 80.0–100.0)
Monocytes Absolute: 0.4 10*3/uL (ref 0.1–1.0)
Monocytes Relative: 8 %
Neutro Abs: 3.2 10*3/uL (ref 1.7–7.7)
Neutrophils Relative %: 72 %
Platelet Count: 195 10*3/uL (ref 150–400)
RBC: 4.2 MIL/uL (ref 3.87–5.11)
RDW: 13.1 % (ref 11.5–15.5)
WBC Count: 4.5 10*3/uL (ref 4.0–10.5)
nRBC: 0 % (ref 0.0–0.2)

## 2022-01-26 LAB — CMP (CANCER CENTER ONLY)
ALT: 24 U/L (ref 0–44)
AST: 21 U/L (ref 15–41)
Albumin: 4.3 g/dL (ref 3.5–5.0)
Alkaline Phosphatase: 76 U/L (ref 38–126)
Anion gap: 7 (ref 5–15)
BUN: 8 mg/dL (ref 8–23)
CO2: 32 mmol/L (ref 22–32)
Calcium: 9.5 mg/dL (ref 8.9–10.3)
Chloride: 99 mmol/L (ref 98–111)
Creatinine: 0.49 mg/dL (ref 0.44–1.00)
GFR, Estimated: >60 mL/min (ref >60)
Glucose, Bld: 99 mg/dL (ref 70–99)
Potassium: 3.5 mmol/L (ref 3.5–5.1)
Sodium: 138 mmol/L (ref 135–145)
Total Bilirubin: 0.4 mg/dL (ref 0.3–1.2)
Total Protein: 6.6 g/dL (ref 6.5–8.1)

## 2022-01-26 NOTE — Progress Notes (Signed)
[place] OFFICE PROGRESS NOTE   Diagnosis: Non-small cell lung cancer  INTERVAL HISTORY:   Brooke [name] returns as scheduled.  She remains off of pembrolizumab.  She continues to have frequent bowel movements, but this has improved.  She currently has 3-4 stools per day.  The stool is semiformed.  No bleeding.  Good appetite.  She recently developed a rash over the back and right leg.  This improved with a steroid cream.  She was started on Citrucel by Dr. Henrene Pastor. She has intermittent discomfort at the left lower abdomen. Objective:  Vital signs in last 24 hours:  Blood pressure (!) 158/77, pulse 78, temperature 98.1 F (36.7 C), temperature source Oral, resp. rate 18, height _0  (1.6 m), weight 83 lb 3.2 oz (37.7 kg), SpO2 98 %.   Resp: Bronchial sounds at the left compared to the right chest, no respiratory distress Cardio: Regular rate and rhythm GI: No hepatosplenomegaly, no mass, nontender Vascular: No leg edema  Skin: No rash  Portacath/PICC-without erythema  Lab Results:  Lab Results  Component Value Date   WBC 4.5 01/26/2022   HGB 12.4 01/26/2022   HCT 38.4 01/26/2022   MCV 91.4 01/26/2022   PLT 195 01/26/2022   NEUTROABS 3.2 01/26/2022    CMP  Lab Results  Component Value Date   NA 138 01/26/2022   K 3.5 01/26/2022   CL 99 01/26/2022   CO2 32 01/26/2022   GLUCOSE 99 01/26/2022   BUN 8 01/26/2022   CREATININE 0.49 01/26/2022   CALCIUM 9.5 01/26/2022   PROT 6.6 01/26/2022   ALBUMIN 4.3 01/26/2022   AST 21 01/26/2022   ALT 24 01/26/2022   ALKPHOS 76 01/26/2022   BILITOT 0.4 01/26/2022   GFRNONAA >60 01/26/2022   GFRAA >60 02/05/2020    No results found for: "CEA1", "CEA", "CAN199", "CA125"  Lab Results  Component Value Date   INR 0.99 11/01/2016   LABPROT 13.1 11/01/2016    Imaging:  No results found.  Medications: I have reviewed the patient's current medications.   Assessment/Plan: Left lung mass PET scan  02/28/2016-hypermetabolic left upper lobe mass, hypermetabolic adjacent nodule, hypermetabolic AP window node CT chest 10/28/2016-enlarging left upper lobe mass, increased AP window lymphadenopathy, new spleen metastasis, new adenopathy at the pancreas tail, upper abdomen, and middle mediastinum CT-guided biopsy of the left lung mass on 11/01/2016, Non-small cell carcinoma most consistent with squamous cell carcinoma PDL1 60% Left lung radiation 11/08/2016 through 11/21/2016 CT chest 12/12/2016-reexpansion of left upper lobe with a decreased left upper lobe mass, unchanged mediastinal adenopathy, progression of a splenic metastasis/pancreatic tail/gastrohepatic ligament metastasis, right lower lobe pneumonia Cycle 1 Pembrolizumab 12/14/2016 Cycle 2 Pembrolizumab 01/03/2017 Cycle 3 Pembrolizumab 01/24/2017 Cycle 4 Pembrolizumab 02/12/2017 CT 03/05/2018-decrease in left upper lobe mass, mediastinal adenopathy, and splenic mass Cycle 5 pembrolizumab 03/07/2017 Cycle 6 Pembrolizumab 04/02/2017 Cycle 7 pembrolizumab 04/25/2017 Cycle 8 Pembrolizumab 05/14/2017 Cycle 9 Pembrolizumab 06/06/2017 CT 06/20/2017-mild decrease in left upper lobe mass, mild increase in paramediastinal left upper lobe nodule-radiation change?, decreased splenic metastasis Cycle 10 pembrolizumab 06/25/2017 Cycle 11 pembrolizumab 07/18/2017 Cycle 12 pembrolizumab 08/29/2017 Cycle 13 pembrolizumab 09/17/2017 Cycle 14 Pembrolizumab 10/10/2017 CT chest 10/24/2017- slight enlargement of small mediastinal lymph nodes, increased left upper lobe consolidation, decreased size of spleen lesion Cycle 15 pembrolizumab 10/29/2017 Cycle 16 Pembrolizumab 11/21/2017 Cycle 17 Pembrolizumab 12/11/2017 Cycle 18 pembrolizumab 01/02/2018 Cycle 19 Pembrolizumab 01/21/2018 Cycle 20 Pembrolizumab 02/13/2018 CT chest 02/27/2018- decreased left paratracheal node, progressive consolidation/fibrosis in the left upper lung,  decreased size of splenic mass Cycle 21  pembrolizumab 03/04/2018 Cycle 22 Pembrolizumab 03/27/2018  Cycle 23 pembrolizumab 04/15/2018 Cycle 24 pembrolizumab 05/08/2018 Cycle 25 Pembrolizumab 05/27/2018 CT chest 06/16/2018- stable left upper lung radiation fibrosis with no evidence of local tumor recurrence.  Decreased left paratracheal adenopathy.  No new or progressive metastatic disease in the chest.  Gastrohepatic ligament lymphadenopathy stable.  Splenic metastasis decreased.  T5 vertebral compression fracture with associated patchy sclerosis and no discrete osseous lesion, new in the interval. Cycle 26 Pembrolizumab 06/19/2018 Cycle 27 pembrolizumab 07/08/2018 Cycle 28 pembrolizumab 07/31/2018 Cycle 29 pembrolizumab 08/19/2018 Cycle 30 Pembrolizumab 09/11/2018 Cycle 31 pembrolizumab 09/30/2018 Cycle 32 pembrolizumab 10/23/2018 CT chest 10/28/2018- left apical pleural-parenchymal opacity consistent with radiation scarring has become more confluent likely representing evolutionary change.  Continued further decrease in size of index left paratracheal node and splenic metastasis.  Gastrohepatic ligament lymph node not changed. Cycle 33 Pembrolizumab 11/11/2018 Cycle 34 pembrolizumab 12/04/2018 Cycle 35 pembrolizumab 12/23/2018 CT chest 02/12/2019- posttreatment changes left upper lobe unchanged.  Continued decrease in size of splenic metastasis.  Stable appearance of left paratracheal lymph nodes.  Stable gastrohepatic adenopathy.  Incomplete visualization of retroperitoneal lymph nodes in the upper abdomen. CT chest 07/02/2019-stable post treatment related changes left lung.  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  Slight regression of enlarged likely metastatic gastrohepatic lymph node. CT chest 11/11/2019-interval enlargement of a right paratracheal lymph node measuring 14 mm, increased from 5 mm.  No new lung mass or nodularity.  New adenopathy in the upper abdomen.  2.7 cm lesion gastrohepatic ligament.  Necrotic node at the splenic hilum measures  4.7 cm.  Large node along the IVC left upper quadrant measures 2.7 cm.  Similar node left adjacent the aorta measures 1.8 cm.  Enhancing lesion in the spleen is unchanged.  Several low-density lesions in the liver are unchanged. Pembrolizumab resumed on a 3-week schedule 12/03/2019 CT chest 02/22/2020-interval resolution of previously demonstrated left paratracheal and upper abdominal adenopathy.  Stable treated metastasis within the spleen.  Stable radiation changes in the left lung with left hilar distortion.  No evidence of local recurrence or progressive metastatic disease. Continuation of pembrolizumab every 3 weeks 02/26/2020 CT chest 06/28/2020-no change in left upper lobe consolidation, unchanged spleen metastasis, no evidence of progressive disease Pembrolizumab continued every 3 weeks 07/01/2020 CT chest 11/02/2020-stable posttreatment changes at the left hilum and left upper lobe, decreased size of spleen lesion, no evidence of disease progression Pembrolizumab every 3 weeks continued CT chest 01/12/2021-negative for acute pulmonary embolus.  Stable radiation fibrosis left upper lobe.  Stable low-density lesion in the spleen.  No new or progressive findings. 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  Splenic lesion stable.  No upper abdominal lymphadenopathy. Pembrolizumab every 3 weeks continued CT chest 08/29/2021-unchanged consolidation/fibrosis at the left upper lung, no evidence of metastatic disease in the chest, COPD, multiple compression fractures, new T11 compression fracture, stable spleen lesion by my review Pembrolizumab every 3 weeks continued Treatment held 10/13/2021 for diarrhea, C. difficile negative Pembrolizumab 11/10/2021 Pembrolizumab  12/01/2021 CTs 12/07/2021-similar treatment effect within the left upper lobe including volume loss, traction bronchiectasis and consolidation.  No local recurrent or active metastatic disease.  Diffuse colitis most significant within the cecum.  New trace left pleural fluid.     2.  05/29/2019 laryngoscopy-exophytic appearing mass mid right vocal cord.   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  Tumor cells negative for p16, CMV and HSV 2.  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 Radiation 07/29/2019-08/25/2019   3. History of acute respiratory failure secondary to #1   4. Oxygen dependent COPD   5. History of a NSTEMI 2015   6. Right lung pneumonia on chest CT 12/12/2016-treated with Levaquin   7.  Grade 2 rash possibly related to Pembrolizumab.  Treatment held 08/06/2017.  Improved 08/29/2017, treatment resumed, rash has resolved   8.  Vertigo January 2022-improved with course of Augmentin for ear/sinus infection   9.  Diarrhea beginning early June 2023; negative C. difficile 10/13/2021; CTs 12/07/2021-diffuse colitis most significant within the cecum.    Disposition: Ms. General appears unchanged.  Pembrolizumab has been placed on hold due to diarrhea and CT evidence of colitis.  She saw Dr. Henrene Pastor and was placed on Citrucel.  The diarrhea has improved.  She is scheduled to see Dr. Henrene Pastor on 02/14/2022.  She will return for an office visit here on 02/23/2022 with a plan to resume pembrolizumab if the diarrhea has resolved.  Betsy Coder, MD  01/26/2022  10:36 AM

## 2022-02-10 DIAGNOSIS — J449 Chronic obstructive pulmonary disease, unspecified: Secondary | ICD-10-CM | POA: Diagnosis not present

## 2022-02-14 ENCOUNTER — Encounter: Payer: Self-pay | Admitting: Internal Medicine

## 2022-02-14 ENCOUNTER — Ambulatory Visit (INDEPENDENT_AMBULATORY_CARE_PROVIDER_SITE_OTHER): Payer: Medicare Other | Admitting: Internal Medicine

## 2022-02-14 VITALS — BP 112/68 | HR 86 | Ht 63.0 in | Wt 82.2 lb

## 2022-02-14 DIAGNOSIS — R935 Abnormal findings on diagnostic imaging of other abdominal regions, including retroperitoneum: Secondary | ICD-10-CM

## 2022-02-14 DIAGNOSIS — R197 Diarrhea, unspecified: Secondary | ICD-10-CM

## 2022-02-14 DIAGNOSIS — R1032 Left lower quadrant pain: Secondary | ICD-10-CM | POA: Diagnosis not present

## 2022-02-14 NOTE — Progress Notes (Signed)
HISTORY OF PRESENT ILLNESS:  Brooke [name] is a 76 y.o. female with multiple medical problems including COPD and metastatic lung cancer.  She was seen in this office January 15, 2022 regarding 64-month history of diarrhea and a CAT scan with mild changes consistent with colitis.  See that dictation for details.  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  This has been held.  At the time of her last visit I recommended stopping Colace.  Also recommended starting Citrucel and follow-up at this time.  Patient tells me that she is doing better.  Has 1-2 bowel movements per day which are formed, though not the diameter that she would like.  She does continue to mention some vague mild left lower quadrant discomfort.  No new problems.  She has a number of questions regarding a number of issues  REVIEW OF SYSTEMS:  All non-GI ROS negative except for fatigue, visual change, shortness of breath  Past Medical History:  Diagnosis Date   Anxiety    Asthenia    CAD in native artery 03/16/2021   Chronic respiratory failure ([place])    a. on home O2.   COPD (chronic obstructive pulmonary disease) ([place])    a. Home O2.   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 Coronary vasospasm   Pneumonia     Past Surgical History:  Procedure Laterality Date   ANGIOPLASTY     LEFT HEART CATHETERIZATION WITH CORONARY ANGIOGRAM N/A 06/08/2013   Procedure: LEFT HEART CATHETERIZATION WITH CORONARY ANGIOGRAM;  Surgeon: Sanda Klein, MD;  Location: [facility] CATH [facility];  Service: Cardiovascular;  Laterality: N/A;    Social History Brooke [name]   reports that she quit smoking about 14 years ago. Her smoking use included cigarettes. She has a 30.00 pack-year smoking history. She has never used smokeless tobacco. She reports that she does not currently use alcohol. She reports that she does not use drugs.  family history includes Lung cancer in her father.  Allergies  Allergen Reactions   Afrin [Oxymetazoline] Anxiety   Pulmicort [Budesonide] Other (See Comments)    "feeling of torture"   Sulfa Antibiotics Other (See Comments)    Hallucinations, decreased appetite   Nitrofuran Derivatives    Codeine Nausea And Vomiting   Imdur [Isosorbide Dinitrate] Other (See Comments)    headache   Prednisone Other (See Comments)    Reaction:Abnormal behavior; cannot take in pill form but CAN tolerate the injection       PHYSICAL EXAMINATION: Vital signs: BP 112/68   Pulse 86   Ht 5\' 3"  (1.6 m)   Wt 82 lb 3.2 oz (37.3 kg)   SpO2 97%   BMI 14.56 kg/m   Constitutional: Thin, chronically ill-appearing, with nasal cannula oxygen in place, no acute distress Psychiatric: alert and oriented x3, cooperative Eyes: extraocular movements intact, anicteric, conjunctiva pink Mouth: oral pharynx moist, no lesions Neck: supple no lymphadenopathy Cardiovascular: heart regular rate and rhythm, no murmur Lungs: clear to auscultation bilaterally Abdomen: soft, nontender, nondistended, no obvious ascites, no peritoneal signs, normal bowel sounds, no organomegaly Rectal: Omitted Extremities: no clubbing, cyanosis,  or lower extremity edema bilaterally Skin: no lesions on visible extremities Neuro: No focal deficits.  Cranial nerves intact  ASSESSMENT:  1.  Issues with diarrhea improved with fiber supplementation.  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   PLAN:  1.  Continue fiber supplementation 2.  Resume care with oncology.

## 2022-02-14 NOTE — Patient Instructions (Signed)
_______________________________________________________  If you are age 76 or older, your body mass index should be between 23-30. Your Body mass index is 14.56 kg/m. If this is out of the aforementioned range listed, please consider follow up with your Primary Care Provider.  If you are age 17 or younger, your body mass index should be between 19-25. Your Body mass index is 14.56 kg/m. If this is out of the aformentioned range listed, please consider follow up with your Primary Care Provider.   ________________________________________________________  The [facility] providers would like to encourage you to use [facility] to communicate with providers for non-urgent requests or questions.  Due to long hold times on the telephone, sending your provider a message by [facility] may be a faster and more efficient way to get a response.  Please allow 48 business hours for a response.  Please remember that this is for non-urgent requests.  _______________________________________________________  Please follow up as needed

## 2022-02-16 ENCOUNTER — Other Ambulatory Visit: Payer: Self-pay

## 2022-02-16 DIAGNOSIS — C3412 Malignant neoplasm of upper lobe, left bronchus or lung: Secondary | ICD-10-CM

## 2022-02-18 ENCOUNTER — Other Ambulatory Visit: Payer: Self-pay | Admitting: Oncology

## 2022-02-23 ENCOUNTER — Inpatient Hospital Stay: Payer: Medicare Other | Attending: Oncology

## 2022-02-23 ENCOUNTER — Inpatient Hospital Stay (HOSPITAL_BASED_OUTPATIENT_CLINIC_OR_DEPARTMENT_OTHER): Payer: Medicare Other | Admitting: Nurse Practitioner

## 2022-02-23 ENCOUNTER — Inpatient Hospital Stay: Payer: Medicare Other

## 2022-02-23 ENCOUNTER — Encounter: Payer: Self-pay | Admitting: Nurse Practitioner

## 2022-02-23 VITALS — BP 145/80 | HR 84 | Temp 98.2°F | Resp 18 | Ht 63.0 in | Wt 81.8 lb

## 2022-02-23 DIAGNOSIS — J189 Pneumonia, unspecified organism: Secondary | ICD-10-CM | POA: Diagnosis not present

## 2022-02-23 DIAGNOSIS — M4854XA Collapsed vertebra, not elsewhere classified, thoracic region, initial encounter for fracture: Secondary | ICD-10-CM | POA: Diagnosis not present

## 2022-02-23 DIAGNOSIS — Z9981 Dependence on supplemental oxygen: Secondary | ICD-10-CM | POA: Diagnosis not present

## 2022-02-23 DIAGNOSIS — Z79899 Other long term (current) drug therapy: Secondary | ICD-10-CM | POA: Insufficient documentation

## 2022-02-23 DIAGNOSIS — I252 Old myocardial infarction: Secondary | ICD-10-CM | POA: Diagnosis not present

## 2022-02-23 DIAGNOSIS — J440 Chronic obstructive pulmonary disease with acute lower respiratory infection: Secondary | ICD-10-CM | POA: Diagnosis not present

## 2022-02-23 DIAGNOSIS — C3412 Malignant neoplasm of upper lobe, left bronchus or lung: Secondary | ICD-10-CM

## 2022-02-23 DIAGNOSIS — C7889 Secondary malignant neoplasm of other digestive organs: Secondary | ICD-10-CM | POA: Insufficient documentation

## 2022-02-23 DIAGNOSIS — R21 Rash and other nonspecific skin eruption: Secondary | ICD-10-CM | POA: Diagnosis not present

## 2022-02-23 DIAGNOSIS — Z86711 Personal history of pulmonary embolism: Secondary | ICD-10-CM | POA: Diagnosis not present

## 2022-02-23 DIAGNOSIS — I2699 Other pulmonary embolism without acute cor pulmonale: Secondary | ICD-10-CM | POA: Insufficient documentation

## 2022-02-23 DIAGNOSIS — K529 Noninfective gastroenteritis and colitis, unspecified: Secondary | ICD-10-CM | POA: Diagnosis not present

## 2022-02-23 LAB — CBC WITH DIFFERENTIAL (CANCER CENTER ONLY)
Abs Immature Granulocytes: 0.01 10*3/uL (ref 0.00–0.07)
Basophils Absolute: 0 10*3/uL (ref 0.0–0.1)
Basophils Relative: 0 %
Eosinophils Absolute: 0.1 10*3/uL (ref 0.0–0.5)
Eosinophils Relative: 2 %
HCT: 39.9 % (ref 36.0–46.0)
Hemoglobin: 13 g/dL (ref 12.0–15.0)
Immature Granulocytes: 0 %
Lymphocytes Relative: 21 %
Lymphs Abs: 1 10*3/uL (ref 0.7–4.0)
MCH: 29.4 pg (ref 26.0–34.0)
MCHC: 32.6 g/dL (ref 30.0–36.0)
MCV: 90.3 fL (ref 80.0–100.0)
Monocytes Absolute: 0.3 10*3/uL (ref 0.1–1.0)
Monocytes Relative: 7 %
Neutro Abs: 3.2 10*3/uL (ref 1.7–7.7)
Neutrophils Relative %: 70 %
Platelet Count: 195 10*3/uL (ref 150–400)
RBC: 4.42 MIL/uL (ref 3.87–5.11)
RDW: 13.1 % (ref 11.5–15.5)
WBC Count: 4.7 10*3/uL (ref 4.0–10.5)
nRBC: 0 % (ref 0.0–0.2)

## 2022-02-23 LAB — CMP (CANCER CENTER ONLY)
ALT: 24 U/L (ref 0–44)
AST: 23 U/L (ref 15–41)
Albumin: 4.4 g/dL (ref 3.5–5.0)
Alkaline Phosphatase: 60 U/L (ref 38–126)
Anion gap: 9 (ref 5–15)
BUN: 11 mg/dL (ref 8–23)
CO2: 29 mmol/L (ref 22–32)
Calcium: 9.8 mg/dL (ref 8.9–10.3)
Chloride: 99 mmol/L (ref 98–111)
Creatinine: 0.46 mg/dL (ref 0.44–1.00)
GFR, Estimated: >60 mL/min (ref >60)
Glucose, Bld: 101 mg/dL — ABNORMAL HIGH (ref 70–99)
Potassium: 3.9 mmol/L (ref 3.5–5.1)
Sodium: 137 mmol/L (ref 135–145)
Total Bilirubin: 0.5 mg/dL (ref 0.3–1.2)
Total Protein: 7 g/dL (ref 6.5–8.1)

## 2022-02-23 LAB — TSH: TSH: 1.51 u[IU]/mL (ref 0.350–4.500)

## 2022-02-23 NOTE — Progress Notes (Signed)
[place] OFFICE PROGRESS NOTE   Diagnosis: Non-small cell lung cancer  INTERVAL HISTORY:   Ms. Kahan returns as scheduled.  Issues with diarrhea continues to be improved but she does not feel she is "back to normal".  She estimates 2 small formed stools a day.  No loose stools.  No nausea or vomiting.  Pain at the left lower abdomen is better.    Objective:  Vital signs in last 24 hours:  Blood pressure (!) 145/80, pulse 84, temperature 98.2 F (36.8 C), temperature source Oral, resp. rate 18, height '5\' 3"'  (1.6 m), weight 81 lb 12.8 oz (37.1 kg), SpO2 100 %.    HEENT: No thrush or ulcers. Resp: Distant breath sounds.  No respiratory distress. Cardio: Regular rate and rhythm. GI: Abdomen soft and nontender.  No hepatosplenomegaly. Vascular: No leg edema.   Lab Results:  Lab Results  Component Value Date   WBC 4.7 02/23/2022   HGB 13.0 02/23/2022   HCT 39.9 02/23/2022   MCV 90.3 02/23/2022   PLT 195 02/23/2022   NEUTROABS 3.2 02/23/2022    Imaging:  No results found.  Medications: I have reviewed the patient's current medications.  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% Left lung radiation 11/08/2016 through 11/21/2016 CT chest 12/12/2016-reexpansion of left upper lobe with a decreased left upper lobe mass, unchanged mediastinal adenopathy, progression of a splenic metastasis/pancreatic tail/gastrohepatic ligament metastasis, right lower lobe pneumonia Cycle 1 Pembrolizumab 12/14/2016 Cycle 2 Pembrolizumab 01/03/2017 Cycle  3 Pembrolizumab 01/24/2017 Cycle 4 Pembrolizumab 02/12/2017 CT 03/05/2018-decrease in left upper lobe mass, mediastinal adenopathy, and splenic mass Cycle 5 pembrolizumab 03/07/2017 Cycle 6 Pembrolizumab 04/02/2017 Cycle 7 pembrolizumab 04/25/2017 Cycle 8 Pembrolizumab 05/14/2017 Cycle 9 Pembrolizumab 06/06/2017 CT 06/20/2017-mild decrease in left upper lobe mass, mild increase in paramediastinal left upper lobe nodule-radiation change?, decreased splenic metastasis Cycle 10 pembrolizumab 06/25/2017 Cycle 11 pembrolizumab 07/18/2017 Cycle 12 pembrolizumab 08/29/2017 Cycle 13 pembrolizumab 09/17/2017 Cycle 14 Pembrolizumab 10/10/2017 CT chest 10/24/2017- slight enlargement of small mediastinal lymph nodes, increased left upper lobe consolidation, decreased size of spleen lesion Cycle 15 pembrolizumab 10/29/2017 Cycle 16 Pembrolizumab 11/21/2017 Cycle 17 Pembrolizumab 12/11/2017 Cycle 18 pembrolizumab 01/02/2018 Cycle 19 Pembrolizumab 01/21/2018 Cycle 20 Pembrolizumab 02/13/2018 CT chest 02/27/2018- decreased left paratracheal node, progressive consolidation/fibrosis in the left upper lung, decreased size of splenic mass Cycle 21 pembrolizumab 03/04/2018 Cycle 22 Pembrolizumab 03/27/2018  Cycle 23 pembrolizumab 04/15/2018 Cycle 24 pembrolizumab 05/08/2018 Cycle 25 Pembrolizumab 05/27/2018 CT chest 06/16/2018- stable left upper lung radiation fibrosis with no evidence of local tumor recurrence.  Decreased left paratracheal adenopathy.  No new or progressive metastatic disease in the chest.  Gastrohepatic ligament lymphadenopathy stable.  Splenic metastasis decreased.  T5 vertebral compression fracture with associated patchy sclerosis and no discrete osseous lesion, new in the interval. Cycle 26 Pembrolizumab 06/19/2018 Cycle 27 pembrolizumab 07/08/2018 Cycle 28 pembrolizumab 07/31/2018 Cycle 29 pembrolizumab 08/19/2018 Cycle 30 Pembrolizumab 09/11/2018 Cycle 31 pembrolizumab 09/30/2018 Cycle 32 pembrolizumab  10/23/2018 CT chest 10/28/2018- left apical pleural-parenchymal opacity consistent with radiation scarring has become more confluent likely representing evolutionary change.  Continued further decrease in size of index left paratracheal node and splenic metastasis.  Gastrohepatic ligament lymph  node not changed. Cycle 33 Pembrolizumab 11/11/2018 Cycle 34 pembrolizumab 12/04/2018 Cycle 35 pembrolizumab 12/23/2018 CT chest 02/12/2019- posttreatment changes left upper lobe unchanged.  Continued decrease in size of splenic metastasis.  Stable appearance of left paratracheal lymph nodes.  Stable gastrohepatic adenopathy.  Incomplete visualization of retroperitoneal lymph nodes in the upper abdomen. CT chest 07/02/2019-stable post treatment related changes left lung.  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  Slight regression of enlarged likely metastatic gastrohepatic lymph node. CT chest 11/11/2019-interval enlargement of a right paratracheal lymph node measuring 14 mm, increased from 5 mm.  No new lung mass or nodularity.  New adenopathy in the upper abdomen.  2.7 cm lesion gastrohepatic ligament.  Necrotic node at the splenic hilum measures 4.7 cm.  Large node along the IVC left upper quadrant measures 2.7 cm.  Similar node left adjacent the aorta measures 1.8 cm.  Enhancing lesion in the spleen is unchanged.  Several low-density lesions in the liver are unchanged. Pembrolizumab resumed on a 3-week schedule 12/03/2019 CT chest 02/22/2020-interval resolution of previously demonstrated left paratracheal and upper abdominal adenopathy.  Stable treated metastasis within the spleen.  Stable radiation changes in the left lung with left hilar distortion.  No evidence of local recurrence or progressive metastatic disease. Continuation of pembrolizumab every 3 weeks 02/26/2020 CT chest 06/28/2020-no change in left upper lobe consolidation, unchanged spleen metastasis, no evidence of progressive disease Pembrolizumab continued  every 3 weeks 07/01/2020 CT chest 11/02/2020-stable posttreatment changes at the left hilum and left upper lobe, decreased size of spleen lesion, no evidence of disease progression Pembrolizumab every 3 weeks continued CT chest 01/12/2021-negative for acute pulmonary embolus.  Stable radiation fibrosis left upper lobe.  Stable low-density lesion in the spleen.  No new or progressive findings. 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  Splenic lesion stable.  No upper abdominal lymphadenopathy. Pembrolizumab every 3 weeks continued CT chest 08/29/2021-unchanged consolidation/fibrosis at the left upper lung, no evidence of metastatic disease in the chest, COPD, multiple compression fractures, new T11 compression fracture, stable spleen lesion by my review Pembrolizumab every 3 weeks continued Treatment held 10/13/2021 for diarrhea, C. difficile negative Pembrolizumab 11/10/2021 Pembrolizumab 12/01/2021 CTs 12/07/2021-similar treatment effect within the left upper lobe including volume loss, traction bronchiectasis and consolidation.  No local recurrent or active metastatic disease.  Diffuse colitis most significant within the cecum.  New trace left pleural fluid.     2.  05/29/2019 laryngoscopy-exophytic appearing mass mid right vocal cord.   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  Tumor cells negative for p16, CMV and HSV 2.  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 Radiation 07/29/2019-08/25/2019   3. History of acute respiratory failure secondary to #1   4. Oxygen dependent COPD   5. History of a NSTEMI 2015   6. Right lung pneumonia on chest CT 12/12/2016-treated with Levaquin   7.  Grade 2 rash possibly related to  Pembrolizumab.  Treatment held 08/06/2017.  Improved 08/29/2017, treatment resumed, rash has resolved   8.  Vertigo January 2022-improved with course of Augmentin for ear/sinus infection   9.  Diarrhea beginning early June 2023; negative C. difficile 10/13/2021; CTs 12/07/2021-diffuse colitis most significant within the cecum.    Disposition: Brooke [name] appears stable.  She is no longer having  diarrhea but does not feel bowel habits have normalized.  We discussed resuming Pembrolizumab.  She does not want to resume today.  We discussed restaging CTs prior to her next office visit in 3 weeks.  She would like to have the CT scans done just prior to her next office visit and then make a decision on resuming Pembrolizumab.  She will have CT scans 03/14/2022.  Follow-up visit 03/16/2022.  We are available to see her sooner if needed.  [name] ANP/GNP-BC   02/23/2022  10:00 AM

## 2022-02-23 NOTE — Progress Notes (Signed)
Patient seen by [name] NP today  Vitals are within treatment parameters.  Labs reviewed by [name] NP and are within treatment parameters.  Per physician team, patient will not be receiving treatment today.

## 2022-02-24 ENCOUNTER — Other Ambulatory Visit: Payer: Self-pay

## 2022-03-06 DIAGNOSIS — J441 Chronic obstructive pulmonary disease with (acute) exacerbation: Secondary | ICD-10-CM | POA: Diagnosis not present

## 2022-03-06 DIAGNOSIS — C3490 Malignant neoplasm of unspecified part of unspecified bronchus or lung: Secondary | ICD-10-CM | POA: Diagnosis not present

## 2022-03-06 DIAGNOSIS — J9611 Chronic respiratory failure with hypoxia: Secondary | ICD-10-CM | POA: Diagnosis not present

## 2022-03-06 DIAGNOSIS — C329 Malignant neoplasm of larynx, unspecified: Secondary | ICD-10-CM | POA: Diagnosis not present

## 2022-03-08 ENCOUNTER — Other Ambulatory Visit: Payer: Self-pay | Admitting: *Deleted

## 2022-03-08 ENCOUNTER — Ambulatory Visit: Payer: Medicare Other | Admitting: Internal Medicine

## 2022-03-08 DIAGNOSIS — C3412 Malignant neoplasm of upper lobe, left bronchus or lung: Secondary | ICD-10-CM

## 2022-03-11 ENCOUNTER — Ambulatory Visit (HOSPITAL_BASED_OUTPATIENT_CLINIC_OR_DEPARTMENT_OTHER)
Admission: RE | Admit: 2022-03-11 | Discharge: 2022-03-11 | Disposition: A | Discharge status: Home / Self Care | Payer: Medicare Other | Source: Ambulatory Visit | Attending: Nurse Practitioner | Admitting: Nurse Practitioner

## 2022-03-11 ENCOUNTER — Other Ambulatory Visit: Payer: Self-pay | Admitting: Oncology

## 2022-03-11 DIAGNOSIS — C3412 Malignant neoplasm of upper lobe, left bronchus or lung: Secondary | ICD-10-CM | POA: Insufficient documentation

## 2022-03-11 DIAGNOSIS — J439 Emphysema, unspecified: Secondary | ICD-10-CM | POA: Diagnosis not present

## 2022-03-11 DIAGNOSIS — C3490 Malignant neoplasm of unspecified part of unspecified bronchus or lung: Secondary | ICD-10-CM | POA: Diagnosis not present

## 2022-03-11 DIAGNOSIS — J479 Bronchiectasis, uncomplicated: Secondary | ICD-10-CM | POA: Diagnosis not present

## 2022-03-11 DIAGNOSIS — N289 Disorder of kidney and ureter, unspecified: Secondary | ICD-10-CM | POA: Diagnosis not present

## 2022-03-11 MED ORDER — IOHEXOL 300 MG/ML  SOLN: 80.0000 mL | Freq: Once | INTRAMUSCULAR | Status: DC | PRN

## 2022-03-11 MED ORDER — IOHEXOL 300 MG/ML  SOLN
100.0000 mL | Freq: Once | INTRAMUSCULAR | Status: AC | PRN
  Administered 2022-03-11: 100 mL via INTRAVENOUS

## 2022-03-13 ENCOUNTER — Other Ambulatory Visit: Payer: Self-pay

## 2022-03-13 DIAGNOSIS — J449 Chronic obstructive pulmonary disease, unspecified: Secondary | ICD-10-CM | POA: Diagnosis not present

## 2022-03-14 NOTE — Progress Notes (Signed)
Ms. Kloos presents for follow-up after completing radiation to the larynx on 08/25/2019 (also completed radiation to her left lung on 11/21/2016)   Pain issues, if any: left ear pain-normal for patient Using a feeding tube?: no Weight changes, if any:  Wt Readings from Last 3 Encounters:  03/23/22 80 lb 9.6 oz (36.6 kg)  03/16/22 80 lb 9.6 oz (36.6 kg)  02/23/22 81 lb 12.8 oz (37.1 kg)    Swallowing issues, if any: no swallowing issues, doing exercises for this Smoking or chewing tobacco? no Using fluoride trays daily? no Last ENT visit was on: Dr. Guy Begin with [facility], several months ago  Other notable issues, if any: Pt would like to gain weight, she would like to be 95 lbs. Good food and fluid intake just not gaining weights. Uses boosts/ ensure at home. Pt has a dry weather cough. Uses 2 L [place]. Pt has more tired and had worse cough over the past two weeks. Pt with a lot of post nasal drip.   Vitals:   03/23/22 1017  BP: (!) 169/101  Pulse: 83  Resp: 20  Temp: 98 F (36.7 C)  SpO2: 100%

## 2022-03-16 ENCOUNTER — Inpatient Hospital Stay: Payer: Medicare Other

## 2022-03-16 ENCOUNTER — Inpatient Hospital Stay: Payer: Medicare Other | Attending: Oncology | Admitting: Oncology

## 2022-03-16 VITALS — BP 142/84 | HR 80 | Temp 98.6°F | Resp 24 | Wt 80.6 lb

## 2022-03-16 DIAGNOSIS — M5137 Other intervertebral disc degeneration, lumbosacral region: Secondary | ICD-10-CM | POA: Diagnosis not present

## 2022-03-16 DIAGNOSIS — Z885 Allergy status to narcotic agent status: Secondary | ICD-10-CM | POA: Insufficient documentation

## 2022-03-16 DIAGNOSIS — Z79899 Other long term (current) drug therapy: Secondary | ICD-10-CM | POA: Diagnosis not present

## 2022-03-16 DIAGNOSIS — K529 Noninfective gastroenteritis and colitis, unspecified: Secondary | ICD-10-CM | POA: Insufficient documentation

## 2022-03-16 DIAGNOSIS — M4854XA Collapsed vertebra, not elsewhere classified, thoracic region, initial encounter for fracture: Secondary | ICD-10-CM | POA: Insufficient documentation

## 2022-03-16 DIAGNOSIS — Z9981 Dependence on supplemental oxygen: Secondary | ICD-10-CM | POA: Insufficient documentation

## 2022-03-16 DIAGNOSIS — Z888 Allergy status to other drugs, medicaments and biological substances status: Secondary | ICD-10-CM | POA: Insufficient documentation

## 2022-03-16 DIAGNOSIS — I252 Old myocardial infarction: Secondary | ICD-10-CM | POA: Diagnosis not present

## 2022-03-16 DIAGNOSIS — C7889 Secondary malignant neoplasm of other digestive organs: Secondary | ICD-10-CM | POA: Diagnosis not present

## 2022-03-16 DIAGNOSIS — K7689 Other specified diseases of liver: Secondary | ICD-10-CM | POA: Insufficient documentation

## 2022-03-16 DIAGNOSIS — J470 Bronchiectasis with acute lower respiratory infection: Secondary | ICD-10-CM | POA: Diagnosis not present

## 2022-03-16 DIAGNOSIS — M5136 Other intervertebral disc degeneration, lumbar region: Secondary | ICD-10-CM | POA: Diagnosis not present

## 2022-03-16 DIAGNOSIS — Z882 Allergy status to sulfonamides status: Secondary | ICD-10-CM | POA: Insufficient documentation

## 2022-03-16 DIAGNOSIS — M47814 Spondylosis without myelopathy or radiculopathy, thoracic region: Secondary | ICD-10-CM | POA: Diagnosis not present

## 2022-03-16 DIAGNOSIS — J440 Chronic obstructive pulmonary disease with acute lower respiratory infection: Secondary | ICD-10-CM | POA: Diagnosis not present

## 2022-03-16 DIAGNOSIS — J432 Centrilobular emphysema: Secondary | ICD-10-CM | POA: Insufficient documentation

## 2022-03-16 DIAGNOSIS — D7389 Other diseases of spleen: Secondary | ICD-10-CM | POA: Diagnosis not present

## 2022-03-16 DIAGNOSIS — Z923 Personal history of irradiation: Secondary | ICD-10-CM | POA: Insufficient documentation

## 2022-03-16 DIAGNOSIS — C3412 Malignant neoplasm of upper lobe, left bronchus or lung: Secondary | ICD-10-CM | POA: Insufficient documentation

## 2022-03-16 LAB — CBC WITH DIFFERENTIAL (CANCER CENTER ONLY)
Abs Immature Granulocytes: 0.01 10*3/uL (ref 0.00–0.07)
Basophils Absolute: 0 10*3/uL (ref 0.0–0.1)
Basophils Relative: 1 %
Eosinophils Absolute: 0.1 10*3/uL (ref 0.0–0.5)
Eosinophils Relative: 2 %
HCT: 40.9 % (ref 36.0–46.0)
Hemoglobin: 13.3 g/dL (ref 12.0–15.0)
Immature Granulocytes: 0 %
Lymphocytes Relative: 19 %
Lymphs Abs: 0.8 10*3/uL (ref 0.7–4.0)
MCH: 29.1 pg (ref 26.0–34.0)
MCHC: 32.5 g/dL (ref 30.0–36.0)
MCV: 89.5 fL (ref 80.0–100.0)
Monocytes Absolute: 0.3 10*3/uL (ref 0.1–1.0)
Monocytes Relative: 7 %
Neutro Abs: 2.9 10*3/uL (ref 1.7–7.7)
Neutrophils Relative %: 71 %
Platelet Count: 202 10*3/uL (ref 150–400)
RBC: 4.57 MIL/uL (ref 3.87–5.11)
RDW: 13.2 % (ref 11.5–15.5)
WBC Count: 4.1 10*3/uL (ref 4.0–10.5)
nRBC: 0 % (ref 0.0–0.2)

## 2022-03-16 LAB — CMP (CANCER CENTER ONLY)
ALT: 26 U/L (ref 0–44)
AST: 22 U/L (ref 15–41)
Albumin: 4.6 g/dL (ref 3.5–5.0)
Alkaline Phosphatase: 57 U/L (ref 38–126)
Anion gap: 8 (ref 5–15)
BUN: 10 mg/dL (ref 8–23)
CO2: 32 mmol/L (ref 22–32)
Calcium: 9.7 mg/dL (ref 8.9–10.3)
Chloride: 98 mmol/L (ref 98–111)
Creatinine: 0.5 mg/dL (ref 0.44–1.00)
GFR, Estimated: >60 mL/min (ref >60)
Glucose, Bld: 91 mg/dL (ref 70–99)
Potassium: 3.5 mmol/L (ref 3.5–5.1)
Sodium: 138 mmol/L (ref 135–145)
Total Bilirubin: 0.5 mg/dL (ref 0.3–1.2)
Total Protein: 7.6 g/dL (ref 6.5–8.1)

## 2022-03-16 NOTE — Progress Notes (Signed)
[place] OFFICE PROGRESS NOTE   Diagnosis: Non-small cell cancer  INTERVAL HISTORY:   Brooke [name] returns as scheduled.  She remains off of specific treatment for lung cancer.  Diarrhea has resolved.  She has formed bowel movements.  She continues to feel "weak ".  She reports a good appetite.  Objective:  Vital signs in last 24 hours:  Blood pressure (!) 142/84, pulse 80, temperature 98.6 F (37 C), temperature source Oral, resp. rate (!) 24, weight 80 lb 9.6 oz (36.6 kg), SpO2 100 %.  Resp: Bronchial sounds at the left upper chest, distant breath sounds, no respiratory distress Cardio: Regular rate and rhythm GI: No hepatosplenomegaly, no mass, nontender Vascular: No leg edema    Lab Results:  Lab Results  Component Value Date   WBC 4.1 03/16/2022   HGB 13.3 03/16/2022   HCT 40.9 03/16/2022   MCV 89.5 03/16/2022   PLT 202 03/16/2022   NEUTROABS 2.9 03/16/2022    CMP  Lab Results  Component Value Date   NA 138 03/16/2022   K 3.5 03/16/2022   CL 98 03/16/2022   CO2 32 03/16/2022   GLUCOSE 91 03/16/2022   BUN 10 03/16/2022   CREATININE 0.50 03/16/2022   CALCIUM 9.7 03/16/2022   PROT 7.6 03/16/2022   ALBUMIN 4.6 03/16/2022   AST 22 03/16/2022   ALT 26 03/16/2022   ALKPHOS 57 03/16/2022   BILITOT 0.5 03/16/2022   GFRNONAA >60 03/16/2022   GFRAA >60 02/05/2020     Medications: I have reviewed the patient's current medications.   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% Left lung radiation 11/08/2016 through  11/21/2016 CT chest 12/12/2016-reexpansion of left upper lobe with a decreased left upper lobe mass, unchanged mediastinal adenopathy, progression of a splenic metastasis/pancreatic tail/gastrohepatic ligament metastasis, right lower lobe pneumonia Cycle 1 Pembrolizumab 12/14/2016 Cycle 2 Pembrolizumab 01/03/2017 Cycle 3 Pembrolizumab 01/24/2017 Cycle 4 Pembrolizumab 02/12/2017 CT 03/05/2018-decrease in left upper lobe mass, mediastinal adenopathy, and splenic mass Cycle 5 pembrolizumab 03/07/2017 Cycle 6 Pembrolizumab 04/02/2017 Cycle 7 pembrolizumab 04/25/2017 Cycle 8 Pembrolizumab 05/14/2017 Cycle 9 Pembrolizumab 06/06/2017 CT 06/20/2017-mild decrease in left upper lobe mass, mild increase in paramediastinal left upper lobe nodule-radiation change?, decreased splenic metastasis Cycle 10 pembrolizumab 06/25/2017 Cycle 11 pembrolizumab 07/18/2017 Cycle 12 pembrolizumab 08/29/2017 Cycle 13 pembrolizumab 09/17/2017 Cycle 14 Pembrolizumab 10/10/2017 CT chest 10/24/2017- slight enlargement of small mediastinal lymph nodes, increased left upper lobe consolidation, decreased size of spleen lesion Cycle 15 pembrolizumab 10/29/2017 Cycle 16 Pembrolizumab 11/21/2017 Cycle 17 Pembrolizumab 12/11/2017 Cycle 18 pembrolizumab 01/02/2018 Cycle 19 Pembrolizumab 01/21/2018 Cycle 20 Pembrolizumab 02/13/2018 CT chest 02/27/2018- decreased left paratracheal node, progressive consolidation/fibrosis in the left upper lung, decreased size of splenic mass Cycle 21 pembrolizumab 03/04/2018 Cycle 22 Pembrolizumab 03/27/2018  Cycle 23 pembrolizumab 04/15/2018 Cycle 24 pembrolizumab 05/08/2018 Cycle 25 Pembrolizumab 05/27/2018 CT chest 06/16/2018- stable left upper lung radiation fibrosis with no evidence of local tumor recurrence.  Decreased left paratracheal adenopathy.  No new or progressive metastatic disease in the chest.  Gastrohepatic ligament lymphadenopathy stable.  Splenic metastasis decreased.  T5 vertebral compression  fracture with associated patchy sclerosis and no discrete osseous lesion, new in  the interval. Cycle 26 Pembrolizumab 06/19/2018 Cycle 27 pembrolizumab 07/08/2018 Cycle 28 pembrolizumab 07/31/2018 Cycle 29 pembrolizumab 08/19/2018 Cycle 30 Pembrolizumab 09/11/2018 Cycle 31 pembrolizumab 09/30/2018 Cycle 32 pembrolizumab 10/23/2018 CT chest 10/28/2018- left apical pleural-parenchymal opacity consistent with radiation scarring has become more confluent likely representing evolutionary change.  Continued further decrease in size of index left paratracheal node and splenic metastasis.  Gastrohepatic ligament lymph node not changed. Cycle 33 Pembrolizumab 11/11/2018 Cycle 34 pembrolizumab 12/04/2018 Cycle 35 pembrolizumab 12/23/2018 CT chest 02/12/2019- posttreatment changes left upper lobe unchanged.  Continued decrease in size of splenic metastasis.  Stable appearance of left paratracheal lymph nodes.  Stable gastrohepatic adenopathy.  Incomplete visualization of retroperitoneal lymph nodes in the upper abdomen. CT chest 07/02/2019-stable post treatment related changes left lung.  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  Slight regression of enlarged likely metastatic gastrohepatic lymph node. CT chest 11/11/2019-interval enlargement of a right paratracheal lymph node measuring 14 mm, increased from 5 mm.  No new lung mass or nodularity.  New adenopathy in the upper abdomen.  2.7 cm lesion gastrohepatic ligament.  Necrotic node at the splenic hilum measures 4.7 cm.  Large node along the IVC left upper quadrant measures 2.7 cm.  Similar node left adjacent the aorta measures 1.8 cm.  Enhancing lesion in the spleen is unchanged.  Several low-density lesions in the liver are unchanged. Pembrolizumab resumed on a 3-week schedule 12/03/2019 CT chest 02/22/2020-interval resolution of previously demonstrated left paratracheal and upper abdominal adenopathy.  Stable treated metastasis within the spleen.  Stable radiation changes  in the left lung with left hilar distortion.  No evidence of local recurrence or progressive metastatic disease. Continuation of pembrolizumab every 3 weeks 02/26/2020 CT chest 06/28/2020-no change in left upper lobe consolidation, unchanged spleen metastasis, no evidence of progressive disease Pembrolizumab continued every 3 weeks 07/01/2020 CT chest 11/02/2020-stable posttreatment changes at the left hilum and left upper lobe, decreased size of spleen lesion, no evidence of disease progression Pembrolizumab every 3 weeks continued CT chest 01/12/2021-negative for acute pulmonary embolus.  Stable radiation fibrosis left upper lobe.  Stable low-density lesion in the spleen.  No new or progressive findings. 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  Splenic lesion stable.  No upper abdominal lymphadenopathy. Pembrolizumab every 3 weeks continued CT chest 08/29/2021-unchanged consolidation/fibrosis at the left upper lung, no evidence of metastatic disease in the chest, COPD, multiple compression fractures, new T11 compression fracture, stable spleen lesion by my review Pembrolizumab every 3 weeks continued Treatment held 10/13/2021 for diarrhea, C. difficile negative Pembrolizumab 11/10/2021 Pembrolizumab 12/01/2021 CTs 12/07/2021-similar treatment effect within the left upper lobe including volume loss, traction bronchiectasis and consolidation.  No local recurrent or active metastatic disease.  Diffuse colitis most significant within the cecum.  New trace left pleural fluid. CTs 03/11/2022-stable radiation fibrosis in the upper left lung, no evidence of local tumor recurrence, no evidence of metastatic disease, normal large bowel without wall thickening     2.  05/29/2019  laryngoscopy-exophytic appearing mass mid right vocal cord.   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  Tumor cells negative for p16, CMV and HSV 2.  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 Radiation 07/29/2019-08/25/2019   3. History of acute respiratory failure secondary to #1   4. Oxygen dependent  COPD   5. History of a NSTEMI 2015   6. Right lung pneumonia on chest CT 12/12/2016-treated with Levaquin   7.  Grade 2 rash possibly related to Pembrolizumab.  Treatment held 08/06/2017.  Improved 08/29/2017, treatment resumed, rash has resolved   8.  Vertigo January 2022-improved with course of Augmentin for ear/sinus infection   9.  Diarrhea beginning early June 2023; negative C. difficile 10/13/2021; CTs 12/07/2021-diffuse colitis most significant within the cecum.    Disposition: Brooke [name] is in clinical remission from non-small cell lung cancer.  Diarrhea has resolved and the restaging CT reveals no evidence of colitis.  There is no evidence of progressive lung cancer on the restaging CTs.  She has been maintained off of pembrolizumab since 11/23/2021.  I recommend continued observation.  Brooke [name] will be referred for physical therapy.  She will return for an office visit in 2 months.  Betsy Coder, MD  03/16/2022  11:40 AM

## 2022-03-18 ENCOUNTER — Other Ambulatory Visit: Payer: Self-pay

## 2022-03-23 ENCOUNTER — Inpatient Hospital Stay: Payer: Medicare Other

## 2022-03-23 ENCOUNTER — Encounter: Payer: Self-pay | Admitting: Radiation Oncology

## 2022-03-23 ENCOUNTER — Telehealth: Payer: Self-pay

## 2022-03-23 ENCOUNTER — Ambulatory Visit
Admission: RE | Admit: 2022-03-23 | Discharge: 2022-03-23 | Disposition: A | Discharge status: Home / Self Care | Payer: Medicare Other | Source: Ambulatory Visit | Attending: Radiation Oncology | Admitting: Radiation Oncology

## 2022-03-23 VITALS — BP 169/101 | HR 83 | Temp 98.0°F | Resp 20 | Ht 63.0 in | Wt 80.6 lb

## 2022-03-23 DIAGNOSIS — M5136 Other intervertebral disc degeneration, lumbar region: Secondary | ICD-10-CM | POA: Insufficient documentation

## 2022-03-23 DIAGNOSIS — C3412 Malignant neoplasm of upper lobe, left bronchus or lung: Secondary | ICD-10-CM

## 2022-03-23 DIAGNOSIS — Z85118 Personal history of other malignant neoplasm of bronchus and lung: Secondary | ICD-10-CM | POA: Insufficient documentation

## 2022-03-23 DIAGNOSIS — M47814 Spondylosis without myelopathy or radiculopathy, thoracic region: Secondary | ICD-10-CM | POA: Insufficient documentation

## 2022-03-23 DIAGNOSIS — Z923 Personal history of irradiation: Secondary | ICD-10-CM | POA: Insufficient documentation

## 2022-03-23 DIAGNOSIS — Z79899 Other long term (current) drug therapy: Secondary | ICD-10-CM | POA: Insufficient documentation

## 2022-03-23 DIAGNOSIS — M5137 Other intervertebral disc degeneration, lumbosacral region: Secondary | ICD-10-CM | POA: Insufficient documentation

## 2022-03-23 DIAGNOSIS — Z85819 Personal history of malignant neoplasm of unspecified site of lip, oral cavity, and pharynx: Secondary | ICD-10-CM | POA: Insufficient documentation

## 2022-03-23 DIAGNOSIS — M4854XA Collapsed vertebra, not elsewhere classified, thoracic region, initial encounter for fracture: Secondary | ICD-10-CM | POA: Insufficient documentation

## 2022-03-23 DIAGNOSIS — J432 Centrilobular emphysema: Secondary | ICD-10-CM | POA: Insufficient documentation

## 2022-03-23 DIAGNOSIS — Z7951 Long term (current) use of inhaled steroids: Secondary | ICD-10-CM | POA: Insufficient documentation

## 2022-03-23 DIAGNOSIS — Z87891 Personal history of nicotine dependence: Secondary | ICD-10-CM | POA: Diagnosis not present

## 2022-03-23 NOTE — Progress Notes (Signed)
Radiation Oncology         [phone] ________________________________  Name: Brooke [name] MRN: [id number]  Date: 03/23/2022  DOB: Mar 24, 1946  Follow-Up Visit Note  CC: [name], MD  Helayne Seminole, MD  Diagnosis and Prior Radiotherapy:       ICD-10-CM   1. 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 Pippen presents for follow-up after completing radiation to the larynx on 08/25/2019 (also completed radiation to her left lung on 11/21/2016)   Pain issues, if any: left ear pain-normal for patient Using a feeding tube?: no Weight changes, if any:  Wt Readings from Last 3 Encounters:  03/23/22 80 lb 9.6 oz (36.6 kg)  03/16/22 80 lb 9.6 oz (36.6 kg)  02/23/22 81 lb 12.8 oz (37.1 kg)   Swallowing issues, if any: no swallowing issues, doing exercises for this Smoking or chewing tobacco? no Using fluoride trays daily? no Last ENT visit was on: Dr. Blenda Nicely with [facility], several months ago  Other notable issues, if any: Pt would like to gain weight, she would like to be 95 lbs. Good food and fluid intake just not gaining weights. Uses boosts/ ensure at home. Pt has a dry weather cough. Uses 2L [place]. Pt is more tired and had worse cough over the past two weeks. Pt with a lot of post nasal drip.     Vitals:   03/23/22 1017  BP: (!) 169/101  Pulse: 83  Resp: 20  Temp: 98 F (36.7 C)  SpO2: 100%            ALLERGIES:  is allergic to afrin [oxymetazoline], pulmicort [budesonide], sulfa antibiotics, nitrofuran derivatives, codeine, imdur [isosorbide dinitrate], and prednisone.  Meds: Current Outpatient  Medications  Medication Sig Dispense Refill   acetaminophen (TYLENOL) 325 MG tablet Per bottle as needed     albuterol (VENTOLIN HFA) 108 (90 Base) MCG/ACT inhaler Inhale 2 puffs into the lungs every 4 (four) hours as needed.     ALPRAZolam (XANAX) 0.25 MG tablet Take 0.5-1 tablets by mouth 2 (two) times daily as needed.     amLODipine (NORVASC) 2.5 MG tablet TAKE 1 TABLET IF BLOOD PRESSURE(140-159), 2 TABLETS(160-179), 3 TABLETS FOR BLOOD PRESSURE GREATER THAN 180 180 tablet 3   atorvastatin (LIPITOR) 20 MG tablet Take 2 tablets (40 mg total) by mouth daily. NEEDS 20 MG TABLETS SECONDARY TO SWALLOWING ISSUES 180 tablet 3   budesonide-formoterol (SYMBICORT) 160-4.5 MCG/ACT inhaler Inhale 2 puffs into the lungs 2 (two) times daily.     guaiFENesin (MUCINEX) 600 MG 12 hr tablet Take 600 mg by mouth 2 (two) times daily as needed for cough or to loosen phlegm.     ipratropium-albuterol (DUONEB) 0.5-2.5 (3) MG/3ML SOLN Take 3 mLs by nebulization every 4 (four) hours as needed (wheezing/shortness of breath). **PLAN C**     methylcellulose (CITRUCEL) oral powder Take 1 packet by mouth daily. 2 tablespoons/day     OVER THE COUNTER MEDICATION Aspercream as needed     OXYGEN 2lpm 24/7     potassium chloride (KLOR-CON M) 10 MEQ tablet TAKE 1 TABLET(10 MEQ) BY  MOUTH DAILY 30 tablet 1   Probiotic Product (PROBIOTIC ADVANCED PO) Take 1 tablet by mouth daily.     Simethicone 180 MG CAPS Use as needed     Vitamin D, Ergocalciferol, (DRISDOL) 1.25 MG (50000 UNIT) CAPS capsule Take 1 capsule (50,000 Units total) by mouth every 7 (seven) days. 5 capsule 0   calcium citrate (CALCITRATE - DOSED IN MG ELEMENTAL CALCIUM) 950 (200 Ca) MG tablet Take 1 tablet (200 mg of elemental calcium total) by mouth daily. (Patient not taking: Reported on 01/15/2022) 180 tablet 3   gabapentin (NEURONTIN) 100 MG capsule Take 1 capsule (100 mg total) by mouth 2 (two) times daily. (Patient not taking: Reported on 01/15/2022) 180 capsule 2    nitroGLYCERIN (NITROSTAT) 0.4 MG SL tablet ONE TABLET UNDER TONGUE AS NEEDED FOR CHEST PAIN EVERY 5 MINUTES FOR 3 DOSES (Patient not taking: Reported on 03/16/2022) 25 tablet 3   NON [place]  Onychomycosis Nail Lacquer -  Fluconazole 2%, Terbinafine 1% DMSO/undecylenic acid 25% Apply to affected nail once daily Qty. 120 gm 3 refills (Patient not taking: Reported on 03/16/2022)     pantoprazole (PROTONIX) 40 MG tablet Take 1 tablet (40 mg total) by mouth daily. Take 30-60 min before first meal of the day (Patient not taking: Reported on 03/23/2022) 30 tablet 2   No current facility-administered medications for this encounter.    Physical Findings: The patient is in no acute distress. Patient is alert and oriented. Wt Readings from Last 3 Encounters:  03/23/22 80 lb 9.6 oz (36.6 kg)  03/16/22 80 lb 9.6 oz (36.6 kg)  02/23/22 81 lb 12.8 oz (37.1 kg)    height is 5\' 3"  (1.6 m) and weight is 80 lb 9.6 oz (36.6 kg). Her oral temperature is 98 F (36.7 C). Her blood pressure is 169/101 (abnormal) and her pulse is 83. Her respiration is 20 and oxygen saturation is 100%. .  General: Alert and oriented, in no acute distress; hoarseness is stable HEENT: Head is normocephalic. Extraocular movements are intact.    Neck is notable for stable lymphedema in anterior neck, no masses; skin intact Skin: Skin in treatment fields shows satisfactory healing  Psychiatric: Judgment and insight are intact. Affect is appropriate.    PROCEDURE NOTE:  PROCEDURE NOTE: After obtaining consent and deferring topical oxymetazoline due to possible allergy, the flexible endoscope was coated with lidocaine gel and was introduced and passed through the nasal cavity.  The nasopharynx, oropharynx, hypopharynx, and larynx  were then examined. No lesions appreciated in the mucosal axis.  The true cords were symmetrically mobile.  She tolerated this well.    Lab Findings: Lab Results  Component Value  Date   WBC 4.1 03/16/2022   HGB 13.3 03/16/2022   HCT 40.9 03/16/2022   MCV 89.5 03/16/2022   PLT 202 03/16/2022    Lab Results  Component Value Date   TSH 1.510 02/23/2022    Radiographic Findings: CT CHEST ABDOMEN PELVIS W CONTRAST  Result Date: 03/13/2022 CLINICAL DATA:  Non-small cell left lung cancer status post radiation therapy and ongoing immunotherapy. Restaging. * Tracking Code: BO * EXAM: CT CHEST, ABDOMEN, AND PELVIS WITH CONTRAST TECHNIQUE: Multidetector CT imaging of the chest, abdomen and pelvis was performed following the standard protocol during bolus administration of intravenous contrast. RADIATION DOSE REDUCTION: This exam was performed according to the departmental dose-optimization program which includes automated exposure control, adjustment of the mA and/or kV according to patient size and/or use of iterative  reconstruction technique. CONTRAST:  132mL OMNIPAQUE IOHEXOL 300 MG/ML  SOLN COMPARISON:  12/07/2021 CT chest, abdomen and pelvis. FINDINGS: CT CHEST FINDINGS Cardiovascular: Normal heart size. No significant pericardial effusion/thickening. Three-vessel coronary atherosclerosis. Atherosclerotic nonaneurysmal thoracic aorta. Normal caliber pulmonary arteries. No central pulmonary emboli. Mediastinum/Nodes: No significant thyroid nodules. Unremarkable esophagus. No axillary adenopathy. No pathologically enlarged mediastinal or hilar lymph nodes. Lungs/Pleura: No pneumothorax. No pleural effusion. Severe centrilobular emphysema with diffuse bronchial wall thickening. Dense sharply marginated left upper lobe and superior segment left lower lobe consolidation with associated severe volume loss, bronchiectasis and distortion, unchanged and compatible with radiation fibrosis. A few scattered anterior right middle lobe indistinct pulmonary nodules, largest 0.4 cm (series 4/image 70), all unchanged. No new significant pulmonary nodules. Musculoskeletal: No aggressive appearing  focal osseous lesions. Chronic mild T5, T8 and T11 vertebral compression fractures. Mild thoracic spondylosis. CT ABDOMEN PELVIS FINDINGS Hepatobiliary: Normal liver size. Simple lobulated 2.9 cm left liver cyst is stable and additional scattered subcentimeter hypodense liver lesions that are too small to characterize are stable and presumably benign. No new liver lesions. Normal gallbladder with no radiopaque cholelithiasis. No biliary ductal dilatation. Pancreas: Normal, with no mass or duct dilation. Spleen: Normal size spleen. Hypodense partially calcified 2.2 cm splenic lesion is unchanged and presumably benign. No new splenic lesions. Adrenals/Urinary Tract: Normal adrenals. No hydronephrosis. Scattered subcentimeter hypodense renal cortical lesions are too small to characterize, not appreciably changed and considered benign, for which no follow-up imaging is recommended. Normal bladder. Stomach/Bowel: Normal non-distended stomach. Normal caliber small bowel with no small bowel wall thickening. Appendix not discretely visualized. Oral contrast transits to the colon. Normal large bowel with no diverticulosis, large bowel wall thickening or pericolonic fat stranding. Vascular/Lymphatic: Atherosclerotic nonaneurysmal abdominal aorta. Patent portal, splenic, hepatic and renal veins. No pathologically enlarged lymph nodes in the abdomen or pelvis. Reproductive: Grossly normal uterus.  No adnexal mass. Other: No pneumoperitoneum, ascites or focal fluid collection. Musculoskeletal: No aggressive appearing focal osseous lesions. Mild-to-moderate lumbar degenerative disc disease, most prominent at L5-S1. IMPRESSION: 1. 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 2. No evidence of metastatic disease in the chest, abdomen or pelvis. 3. Three-vessel coronary atherosclerosis. 4. Chronic mild thoracic vertebral compression fractures. 5. Aortic Atherosclerosis (ICD10-I70.0)  and Emphysema (ICD10-J43.9). Electronically Signed   By: Ilona Sorrel M.D.   On: 03/13/2022 09:38     Impression/Plan:    1) Head and Neck Cancer Status: NED  2) Nutritional Status: stable She would like to gain weight.  She states she has two nutritionists that she is working with.  She states she is eating a wide variety of high-calorie foods.  I suspect her metabolism may be impacted by her lung disease.  Wt Readings from Last 3 Encounters:  03/23/22 80 lb 9.6 oz (36.6 kg)  03/16/22 80 lb 9.6 oz (36.6 kg)  02/23/22 81 lb 12.8 oz (37.1 kg)   PEG tube: None  3) Risk Factors: The patient has been educated about risk factors including alcohol and tobacco abuse; they understand that avoidance of alcohol and tobacco is important to prevent recurrences as well as other cancers  4) Swallowing: Continue swallowing therapy  5)  Thyroid function: Check annually - WNL Lab Results  Component Value Date   TSH 1.510 02/23/2022    6)  Cont'd follow-up w/ ENT recommended. Pt will consider this in 5mo (knows to call to schedule).  I will see her back in 12 mo  for repeat scope as well.    On date of service, in total, I spent 30 minutes on this encounter.  Patient seen in person. _____________________________________   Eppie Gibson, MD

## 2022-03-23 NOTE — Addendum Note (Signed)
Encounter addended by: Elza Rafter, RN on: 03/23/2022 11:15 AM  Actions taken: Charge Capture section accepted

## 2022-03-23 NOTE — Telephone Encounter (Signed)
Pt called RN Anderson Malta to inquire about how to get more of the sonefine cream for her skin. She noted that her skin was itchy and dry. She has tried many creams (including ones with vitamin E ) and nothing else helps. She did state she could not come get this cream until the 30th when she had a ride to get to another appointment in town. Rn encouraged her to call if she could obtain the cream earlier.

## 2022-03-24 ENCOUNTER — Other Ambulatory Visit: Payer: Self-pay

## 2022-03-27 ENCOUNTER — Ambulatory Visit (INDEPENDENT_AMBULATORY_CARE_PROVIDER_SITE_OTHER): Payer: Medicare Other | Admitting: Cardiovascular Disease

## 2022-03-27 ENCOUNTER — Encounter (HOSPITAL_BASED_OUTPATIENT_CLINIC_OR_DEPARTMENT_OTHER): Payer: Self-pay | Admitting: Cardiovascular Disease

## 2022-03-27 VITALS — BP 138/78 | HR 85 | Ht 63.0 in | Wt 80.1 lb

## 2022-03-27 DIAGNOSIS — J449 Chronic obstructive pulmonary disease, unspecified: Secondary | ICD-10-CM | POA: Diagnosis not present

## 2022-03-27 DIAGNOSIS — I2510 Atherosclerotic heart disease of native coronary artery without angina pectoris: Secondary | ICD-10-CM

## 2022-03-27 DIAGNOSIS — R0989 Other specified symptoms and signs involving the circulatory and respiratory systems: Secondary | ICD-10-CM | POA: Diagnosis not present

## 2022-03-27 NOTE — Assessment & Plan Note (Signed)
Symptoms are progressing.  She is cachectic and feels like she is slowing working more and more to take deep breaths.  Continue f/u with pulmonary and supplemental oxygen.

## 2022-03-27 NOTE — Progress Notes (Signed)
Cardiology Office Note   Date:  03/27/2022   ID:  Anne, Boltz 1945/07/13, MRN [id number]  PCP:  [name], MD  Cardiologist:  Skeet Latch, MD  Electrophysiologist:  None   Evaluation Performed:  Follow-Up Visit  Chief Complaint: Hypertension  History of Present Illness:    Jillien Yakel is a 76 y.o. female with COPD on home O2, nonobstructive CAD, hyperlipidemia, stage IV squamous cell lung cancer, radiation fibrosis, GERD, and prior tobacco abuse here for follow-up.  She was initially seen by cardiology 06/2013 when she had an NSTEMI.  Heart cath at that time revealed minimal, non-obstructive CAD.  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  LVEF was 60%.  It was thought that her symptoms were attributable to spasm.  She takes atorvastatin every other day to avoid myalgias.  Amlodipine was started for spasms.  She was diagnosed with lung cancer in 2017.  A bronchoscopy was recommended but she declined at that time.  In 10/2016 she developed progressive symptoms with metastatic disease.  She completed 35 cycles of chemotherapy with pembrolizumab.  This helped her metastatic disease but not her primary mass.  She was subsequently restarted on chemotherapy with pembrolizumab.  Ms. Ziesmer saw [name], [place], on 12/2019 with elevated blood pressure.  He recommended watchful waiting at that time.  It was noted that anxiety tended to trigger her blood pressure.  At her visit 09/2021 her blood pressure continue to be labile and triggered by anxiety. She was changing her lodipine dose based on her blood pressure every day. We encourage her to work with a care guide for stress management.  Today, she reports that she recently has had scans that revealed she is currently cancer free. Her blood pressure was elevated in the office today which she attributes to stress during transportation to her visit. Her blood pressure at home yesterday was 123/80. She  reports that if she has not eaten anything, her blood pressure tends to run high. She reports that she had a tumor in her upper left lope of her lung. She has slightly deteriorated disc and endorses pain on her left lower back while sitting. She believes that her COPD medication is not working that well. She has been having trouble taking a breath. It is rare she has to take amlodipine for relief. She reports weighting about 80 lbs. She denies any palpitations, chest pain, shortness of breath, or peripheral edema. No lightheadedness, headaches, syncope, orthopnea, or PND.  Past Medical History:  Diagnosis Date   Anxiety    Asthenia    CAD in native artery 03/16/2021   Chronic respiratory failure ([place])    a. on home O2.   COPD (chronic obstructive pulmonary disease) ([place])    a. Home O2.   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 Coronary vasospasm   Pneumonia    Past Surgical History:  Procedure Laterality Date   ANGIOPLASTY     LEFT HEART CATHETERIZATION WITH CORONARY ANGIOGRAM N/A 06/08/2013   Procedure: LEFT HEART CATHETERIZATION WITH CORONARY ANGIOGRAM;  Surgeon: Sanda Klein, MD;  Location: [place] CATH LAB;  Service: Cardiovascular;  Laterality: N/A;     Current Meds  Medication Sig   acetaminophen (TYLENOL) 325 MG tablet Per bottle as needed   albuterol (VENTOLIN HFA) 108 (90 Base) MCG/ACT inhaler Inhale 2 puffs into the lungs every 4 (four) hours as needed.   ALPRAZolam (XANAX) 0.25 MG tablet Take 0.5-1 tablets by mouth 2 (two) times daily as needed.   amLODipine (NORVASC) 2.5 MG tablet TAKE 1 TABLET IF BLOOD PRESSURE(140-159), 2 TABLETS(160-179), 3  TABLETS FOR BLOOD PRESSURE GREATER THAN 180   atorvastatin (LIPITOR) 20 MG tablet Take 2 tablets (40 mg total) by mouth daily. NEEDS 20 MG TABLETS SECONDARY TO SWALLOWING ISSUES   budesonide-formoterol (SYMBICORT) 160-4.5 MCG/ACT inhaler Inhale 2 puffs into the lungs 2 (two) times daily.   guaiFENesin (MUCINEX) 600 MG 12 hr tablet Take 600 mg by mouth 2 (two) times daily as needed for cough or to loosen phlegm.   ipratropium-albuterol (DUONEB) 0.5-2.5 (3) MG/3ML SOLN Take 3 mLs by nebulization every 4 (four) hours as needed (wheezing/shortness of breath). **PLAN C**   methylcellulose (CITRUCEL) oral powder Take 1 packet by mouth daily. 2 tablespoons/day   nitroGLYCERIN (NITROSTAT) 0.4 MG SL tablet ONE TABLET UNDER TONGUE AS NEEDED FOR CHEST PAIN EVERY 5 MINUTES FOR 3 DOSES   OVER THE COUNTER MEDICATION Aspercream as needed   OXYGEN 2lpm 24/7   potassium chloride (KLOR-CON M) 10 MEQ tablet TAKE 1 TABLET(10 MEQ) BY MOUTH DAILY   Probiotic Product (PROBIOTIC ADVANCED PO) Take 1 tablet by mouth daily.   Simethicone 180 MG CAPS Use as needed   Vitamin D, Ergocalciferol, (DRISDOL) 1.25 MG (50000 UNIT) CAPS capsule Take 1 capsule (50,000 Units total) by mouth every 7 (seven) days.     Allergies:   Afrin [oxymetazoline], Pulmicort [budesonide], Sulfa antibiotics, Nitrofuran derivatives, Codeine, Imdur [isosorbide dinitrate], and Prednisone   Social History   Tobacco Use   Smoking status: Former    Packs/day: 1.00    Years: 30.00    Total pack years: 30.00    Types: Cigarettes    Quit date: 05/08/2007    Years since quitting: 14.8   Smokeless tobacco: Never  Vaping Use   Vaping Use: Never used  Substance Use Topics   Alcohol use: Not Currently   Drug use: No     Family Hx: The patient's family history includes Lung cancer in her father. There is no history of Heart attack, Stroke, or Hypertension.  ROS:   Please see the history of present illness.    (+) Shortness of breath (+) Lower  back pain All other systems reviewed and are negative.   Prior CV studies:   The following studies were reviewed today:  Echo 06/06/2013: Study Conclusions   Left ventricle: The cavity size was normal. Systolic  function was normal. The estimated ejection fraction was in  the range of 50% to 55%. Regional wall motion abnormalities  cannot be excluded. Doppler parameters are consistent with  abnormal left ventricular relaxation (grade 1 diastolic  dysfunction).        Labs/Other Tests and Data Reviewed:    EKG:   03/27/2022: EKG was not ordered. 12/22/19: An ECG dated 12/22/19 was personally reviewed today and demonstrated:  sinus rhythm.  Rate 72 bpm.  Cannot rule out prior septal infarct. 09/13/21: Sinus rhythm.  Rate 86 bpm.  Recent Labs: 02/23/2022: TSH 1.510 03/16/2022: ALT 26; BUN 10; Creatinine 0.50; Hemoglobin 13.3; Platelet Count 202; Potassium 3.5; Sodium 138   Recent  Lipid Panel Lab Results  Component Value Date/Time   CHOL 220 (H) 09/05/2020 12:17 PM   TRIG 47 09/05/2020 12:17 PM   HDL 102 09/05/2020 12:17 PM   CHOLHDL 2.2 09/05/2020 12:17 PM   CHOLHDL 2.2 08/18/2015 10:41 AM   LDLCALC 110 (H) 09/05/2020 12:17 PM    Wt Readings from Last 3 Encounters:  03/27/22 80 lb 1.6 oz (36.3 kg)  03/23/22 80 lb 9.6 oz (36.6 kg)  03/16/22 80 lb 9.6 oz (36.6 kg)     Risk Assessment/Calculations:      Objective:    VS:  BP 138/78 (BP Location: Right Arm, Patient Position: Sitting)   Pulse 85   Ht 5\' 3"  (1.6 m)   Wt 80 lb 1.6 oz (36.3 kg)   BMI 14.19 kg/m  , BMI Body mass index is 14.19 kg/m. GENERAL:  Frail, elderly woman in no acute distress.  Hoarse HEENT: Pupils equal round and reactive, fundi not visualized, oral mucosa unremarkable NECK:  No jugular venous distention, waveform within normal limits, carotid upstroke brisk and symmetric, no bruit.  +nasal cannula LUNGS:  Clear to auscultation bilaterally HEART:  RRR.  PMI not displaced or sustained,S1 and S2  within normal limits, no S3, no S4, no clicks, no rubs, no murmurs ABD:  Flat, positive bowel sounds normal in frequency in pitch, no bruits, no rebound, no guarding, no midline pulsatile mass, no hepatomegaly, no splenomegaly EXT:  2 plus pulses throughout, no edema, no cyanosis no clubbing SKIN:  No rashes no nodules NEURO:  Cranial nerves II through XII grossly intact, motor grossly intact throughout PSYCH:  Cognitively intact, oriented to person place and time   ASSESSMENT & PLAN:    CAD in native artery She has non-obstructive CAD.  She has no symptoms.  Continue amlodipine and atorvastatin.  Check fasting lipids/CMP when she comes back for labs with [name].    Labile hypertension Blood pressure has been well-controlled at home.  SHe usually only has to take 2.5mg  of amlodipine.  She is still triggered by anxiety.  It did improved on repeat.   COPD  GOLD IV  Symptoms are progressing.  She is cachectic and feels like she is slowing working more and more to take deep breaths.  Continue f/u with pulmonary and supplemental oxygen.      Medication Adjustments/Labs and Tests Ordered: Current medicines are reviewed at length with the patient today.  Concerns regarding medicines are outlined above.   Tests Ordered: Orders Placed This Encounter  Procedures   Lipid panel   Comprehensive metabolic panel     Medication Changes: No orders of the defined types were placed in this encounter.    Follow Up: with Atticus Lemberger C. Oval Linsey, MD, [facility] in 1 year   Signed, Marlana Latus  03/27/2022 10:14 AM    [place]

## 2022-03-27 NOTE — Assessment & Plan Note (Signed)
Blood pressure has been well-controlled at home.  SHe usually only has to take 2.5mg  of amlodipine.  She is still triggered by anxiety.  It did improved on repeat.

## 2022-03-27 NOTE — Patient Instructions (Signed)
Medication Instructions:  Your physician recommends that you continue on your current medications as directed. Please refer to the Current Medication list given to you today.  *If you need a refill on your cardiac medications before your next appointment, please call your pharmacy*  Lab Work: FASTING LP/CMET IN COUPLE MONTHS  If you have labs (blood work) drawn today and your tests are completely normal, you will receive your results only by: [place] (if you have MyChart) OR A paper copy in the mail If you have any lab test that is abnormal or we need to change your treatment, we will call you to review the results.  Testing/Procedures: NONE   Follow-Up: At [facility], you and your health needs are our priority.  As part of our continuing mission to provide you with exceptional heart care, we have created designated Provider Care Teams.  These Care Teams include your primary Cardiologist (physician) and Advanced Practice Providers (APPs -  Physician Assistants and Nurse Practitioners) who all work together to provide you with the care you need, when you need it.  We recommend signing up for the patient portal called "MyChart".  Sign up information is provided on this After Visit Summary.  MyChart is used to connect with patients for Virtual Visits (Telemedicine).  Patients are able to view lab/test results, encounter notes, upcoming appointments, etc.  Non-urgent messages can be sent to your provider as well.   To learn more about what you can do with MyChart, go to [email or web address].    Your next appointment:   12 month(s)  The format for your next appointment:   In Person  Provider:   DR [facility]

## 2022-03-27 NOTE — Assessment & Plan Note (Addendum)
She has non-obstructive CAD.  She has no symptoms.  Continue amlodipine and atorvastatin.  Check fasting lipids/CMP when she comes back for labs with [name].

## 2022-03-28 ENCOUNTER — Telehealth: Payer: Self-pay | Admitting: *Deleted

## 2022-03-28 NOTE — Telephone Encounter (Signed)
Called [facility] to f/u on home health referral for PT. She states she has not been called yet.  [facility] [phone]) and had to leave a voice mail requesting they f/u on this referral placed on 03/16/22. Requested return call with update.

## 2022-04-01 ENCOUNTER — Other Ambulatory Visit: Payer: Self-pay | Admitting: Oncology

## 2022-04-02 NOTE — Telephone Encounter (Signed)
Spoke w/Brittany with [facility]. She will follow up on referral and reach back out to this office w/status. Phone [phone].

## 2022-04-03 ENCOUNTER — Telehealth: Payer: Self-pay | Admitting: Internal Medicine

## 2022-04-03 MED ORDER — AZITHROMYCIN 250 MG PO TABS: 250.0000 mg | ORAL_TABLET | ORAL | 0 refills | Status: DC

## 2022-04-03 MED ORDER — METHYLPREDNISOLONE 4 MG PO TABS: ORAL_TABLET | ORAL | 0 refills | Status: DC

## 2022-04-03 NOTE — Telephone Encounter (Signed)
Spoke with Eliseo Squires, pt's home health nurse  She states pt having increased SOB, wheezing, and cough with yellow sputum past few days  She using using all of her meds as prescribed and using neb 2-3 x per day  She feels neb tx does not help as much as they used to  She denies any fevers, aches  She has appt with Dr Melvyn Novas here on 04/05/22  Wants something to be called in sooner  Please advise, thanks!  Allergies  Allergen Reactions   Afrin [Oxymetazoline] Anxiety   Pulmicort [Budesonide] Other (See Comments)    "feeling of torture"   Sulfa Antibiotics Other (See Comments)    Hallucinations, decreased appetite   Nitrofuran Derivatives    Codeine Nausea And Vomiting   Imdur [Isosorbide Dinitrate] Other (See Comments)    headache   Prednisone Other (See Comments)    Reaction:Abnormal behavior; cannot take in pill form but CAN tolerate the injection

## 2022-04-03 NOTE — Telephone Encounter (Signed)
Spoke with the pt as well as her nurse, Eliseo Squires and notified of response per Dr Melvyn Novas. She verbalized understanding. Rxs sent. Nothing further needed.

## 2022-04-03 NOTE — Telephone Encounter (Signed)
Medrol 4 mg  x 4 until better then 2 daily x 5 daily , 1 daily for 5 days and stop     Zpak   Keep appt  Remember neb is up to q 4 h prn max dose

## 2022-04-05 ENCOUNTER — Encounter: Payer: Self-pay | Admitting: Internal Medicine

## 2022-04-05 ENCOUNTER — Ambulatory Visit (INDEPENDENT_AMBULATORY_CARE_PROVIDER_SITE_OTHER): Payer: Medicare Other | Admitting: Internal Medicine

## 2022-04-05 VITALS — BP 104/60 | Temp 98.1°F | Ht 63.0 in | Wt 80.0 lb

## 2022-04-05 DIAGNOSIS — R0602 Shortness of breath: Secondary | ICD-10-CM

## 2022-04-05 DIAGNOSIS — J449 Chronic obstructive pulmonary disease, unspecified: Secondary | ICD-10-CM | POA: Diagnosis not present

## 2022-04-05 DIAGNOSIS — J9611 Chronic respiratory failure with hypoxia: Secondary | ICD-10-CM | POA: Diagnosis not present

## 2022-04-05 MED ORDER — METHYLPREDNISOLONE ACETATE 80 MG/ML IJ SUSP
120.0000 mg | Freq: Once | INTRAMUSCULAR | Status: AC
  Administered 2022-04-05: 120 mg via INTRAMUSCULAR

## 2022-04-05 MED ORDER — CEFDINIR 300 MG PO CAPS: 300.0000 mg | ORAL_CAPSULE | Freq: Two times a day (BID) | ORAL | 11 refills | Status: DC

## 2022-04-05 NOTE — Patient Instructions (Addendum)
Plan A = Automatic = Always=    Symbicort 160 Take 2 puffs first thing in am and then another 2 puffs about 12 hours later.     Plan B = Backup (to supplement plan A, not to replace it) Only use your albuterol inhaler as a rescue medication to be used if you can't catch your breath by resting or doing a relaxed purse lip breathing pattern.  - The less you use it, the better it will work when you need it. - Ok to use the inhaler up to 2 puffs  every 4 hours if you must but call for appointment if use goes up over your usual need - Don't leave home without it !!  (think of it like the spare tire for your car)   Plan C = Crisis (instead of Plan B but only if Plan B stops working) - only use your albuterol nebulizer if you first try Plan B and it fails to help > ok to use the nebulizer up to every 4 hours but if start needing it regularly call for immediate appointment   Depomedrol 120 mg IM   For nasty mucus try >  omnicef 300 mg twice daily x 7 days and use the flutter valve as much as possible  Please schedule a follow up visit in 6 months but call sooner if needed

## 2022-04-05 NOTE — Progress Notes (Addendum)
Subjective:    Patient ID: Brooke [name], female   DOB: 04-11-1946,    MRN: [id number]     Brief patient profile:  28 yowf quit smoking 2009 on 02 noct 2012 maint on symbicort 160 since aorund  2014 referred to pulmonary clinic 02/15/2016 by Dr   Kathryne Eriksson for RUL Lung mass dx with Stage IV nsc 11/01/16 in setting of GOLD IV copd      History of Present Illness  02/15/2016 1st [facility] office visit/ Madalyn Legner  GOLD IV copd/ symb 160 2bid maint  Chief Complaint  Patient presents with   Pulmonary Consult    referred by Dr. Kathryne Eriksson for COPD.  MMRC2 = can't walk a nl pace on a flat grade s sob but does fine slow and flat eg [employer]/ [employer] on 2lpm  New onset bloody mucus plugs November 11 2015 assoc with wt loss  02 2lpm hs and with walking / rare saba needed rec For cough > mucinex up to 1200 mg every 12 hours as needed Rec:  Fob but refused   11/01/16   CT directed bx >>>  Pos NSC LUL    Diagnosis:   Stage IV NSCLC, Squamous cell carcinoma of the left upper lobe.      Indication for treatment:  palliative        Radiation treatment dates:   11/08/2016 to 11/21/2016   Site/dose:   The Left lung was treated to 30 Gy in 10 fractions at 3 Gy per fraction.    Beams/energy:   3D // 10X, 6X    06/13/2018  f/u ov/Ruchama Kubicek re: GOLD IV / 02 hs and prn  Chief Complaint  Patient presents with   Shortness of Breath    Worse since the last visit.  Dyspnea:  Still doing [employer]  On 2lpm but not checking sats as rec with activity  Cough: dry worse day than noct Sleeping: falt bed / couple of pillows SABA use: helps some / uses once a day esp in am 02: 2lpm hs and prn  rec Plan A = Automatic = symbicort 160 Take 2 puffs first thing in am and then another 2 puffs about 12 hours later.  Work on inhaler technique: Plan B = Backup Only use your albuterol inhaler as a rescue medication Plan C = Crisis - only use your albuterol/ipatropium nebulizer if you first try Plan B and  it fails to help > ok to use the nebulizer up to every 4 hours but if start needing it regularly call for immediate appointment Please schedule a follow up visit in 3 months but call sooner if needed  - add chart review does not show alpha one screen done     11/30/220 televisit, new hoarse rec  gerd rx > ENT F/u > sq cell ca vc    Finishied RT April 20th 2021 in [place]    06/15/2021  f/u ov/Carma Dwiggins re: GOLD 4 - 02 dep maint on symb 160   Chief Complaint  Patient presents with   Follow-up    C/o sob not getting any better for 5 wks., energy decreased, cough-white, frothy,wheezing  Dyspnea:   100 ft with sats  2lpm stay above 90% per pt Cough: min white  Sleeping: sitting up mostly SABA use: duoneb  02: 2lpm prn  Persistent new Bilateral positional Mscp  x  2 weeks T 8 level but no spine pain but classic C shape symmetric pattern which is only  worse on one side vs the other in different positions  Rec Depomedrol 120 mg IM  Prilosec 20 x  2  = Pantoprazole (protonix) 40 mg  Take  30-60 min before first meal of the day and continue  Pepcid (famotidine)  20 mg after supper until return to office  I will be referring you  for orthopedic evaluation for your Thoracic spine fracture > nsaids eliminated pain  Please schedule a follow up visit in 3 months but call sooner if needed         May 27th diarrhea pt attributed to stopping medrol    11/29/2021  f/u ov/Yolinda Duerr re: GOLD IV copd/ 02 dep maint on Symbciort 160 2bid   Chief Complaint  Patient presents with   Follow-up    No new problems.  Breathing is no worse than last visit.   Dyspnea:  3 laps around house 65 ft each x up 20 laps typically p neb and p 2nd lap feels still needs 02 at 2lpm to complete with sats then always > 90%   Cough: prod daily sputumyellow "nothing ever helps " Sleeping: 30 degrees with pillows  SABA use: 1-2 puffs hfa/ and neb avg twice daily  02: 1.5 - 2lpm most of the time  Covid status:   most of them   Rec Work on inhaler technique:   If breathing worse and you need more than usual, activate action plan Medrol 4 mg  x 4 until better then 2 daily x 5 daily , 1 daily for 5 days and stop     04/05/2022  f/u ov/Blanca Thornton re: GOLD 4 copd  maint on symbicort 160   Chief Complaint  Patient presents with   Follow-up    Sob increased, cough-yellow, tan, frothy,no fever   Dyspnea:  still doing laps around house s change doe  Cough: flare comes and goes x months  but worse in last month  Sleeping: 30 dgrees sleeps ok  SABA use: neb several times a day  02: 2lpm  24/7     No obvious day to day or daytime variability or assoc   mucus plugs or hemoptysis or cp or chest tightness, subjective wheeze or overt sinus or hb symptoms.     Also denies any obvious fluctuation of symptoms with weather or environmental changes or other aggravating or alleviating factors except as outlined above   No unusual exposure hx or h/o childhood pna/ asthma or knowledge of premature birth.  Current Allergies, Complete Past Medical History, Past Surgical History, Family History, and Social History were reviewed in [employer] record.  ROS  The following are not active complaints unless bolded Hoarseness, sore throat, dysphagia, dental problems, itching, sneezing,  nasal congestion or discharge of excess mucus or purulent secretions, ear ache,   fever, chills, sweats, unintended wt loss or wt gain, classically pleuritic or exertional cp,  orthopnea pnd or arm/hand swelling  or leg swelling, presyncope, palpitations, abdominal pain, anorexia, nausea, vomiting, diarrhea  or change in bowel habits or change in bladder habits, change in stools or change in urine, dysuria, hematuria,  rash, arthralgias, visual complaints, headache, numbness, weakness or ataxia or problems with walking or coordination,  change in mood or  memory.        Current Meds  Medication Sig   acetaminophen (TYLENOL) 325 MG tablet  Per bottle as needed   albuterol (VENTOLIN HFA) 108 (90 Base) MCG/ACT inhaler Inhale 2 puffs into the lungs every 4 (four)  hours as needed.   ALPRAZolam (XANAX) 0.25 MG tablet Take 0.5-1 tablets by mouth 2 (two) times daily as needed.   amLODipine (NORVASC) 2.5 MG tablet TAKE 1 TABLET IF BLOOD PRESSURE(140-159), 2 TABLETS(160-179), 3 TABLETS FOR BLOOD PRESSURE GREATER THAN 180   atorvastatin (LIPITOR) 20 MG tablet Take 2 tablets (40 mg total) by mouth daily. NEEDS 20 MG TABLETS SECONDARY TO SWALLOWING ISSUES   budesonide-formoterol (SYMBICORT) 160-4.5 MCG/ACT inhaler Inhale 2 puffs into the lungs 2 (two) times daily.   guaiFENesin (MUCINEX) 600 MG 12 hr tablet Take 600 mg by mouth 2 (two) times daily as needed for cough or to loosen phlegm.   ipratropium-albuterol (DUONEB) 0.5-2.5 (3) MG/3ML SOLN Take 3 mLs by nebulization every 4 (four) hours as needed (wheezing/shortness of breath). **PLAN C**   OVER THE COUNTER MEDICATION Aspercream as needed   OXYGEN 2lpm 24/7   pantoprazole (PROTONIX) 40 MG tablet Take 1 tablet (40 mg total) by mouth daily. Take 30-60 min before first meal of the day   potassium chloride (KLOR-CON M) 10 MEQ tablet TAKE 1 TABLET(10 MEQ) BY MOUTH DAILY   Probiotic Product (PROBIOTIC ADVANCED PO) Take 1 tablet by mouth daily.   Simethicone 180 MG CAPS Use as needed                  Objective:   Physical Exam  Wts  04/05/2022    80  11/29/2021      83  08/30/2021      89  08/01/2021      88  06/15/2021        89  05/11/2021        92 03/20/2021    96   09/12/2020         96 06/06/2020        100 01/18/2020          92 10/16/2019          93  03/05/2019      99  06/13/2018         123  03/14/2018       127  12/09/2017         125   06/10/2017         119  01/08/2017         112   12/06/2016        110   02/15/16 119 lb (54 kg)  08/18/15 137 lb 12.8 oz (62.5 kg)  02/17/15 138 lb 1.9 oz (62.7 kg)    Vital signs reviewed  04/05/2022  - Note at rest 02 sats  98% on 1.5 lpm     General appearance:    elderl wf/ arrive in w/c with prominent pseudowheeze/ better with plm      HEENT :  Oropharynx  clear      NECK :  without JVD/Nodes/TM/ nl carotid upstrokes bilaterally   LUNGS: no acc muscle use,  Mod barrel  contour chest wall with bilateral  minimal insp/exp rhonchi and  without cough on insp or exp maneuvers and mod  Hyperresonant  to  percussion bilaterally     CV:  RRR  no s3 or murmur or increase in P2, and no edema   ABD:  soft and nontender with pos mid insp Hoover's  in the supine position. No bruits or organomegaly appreciated, bowel sounds nl  MS:   Ext warm without deformities or   obvious joint restrictions , calf tenderness, cyanosis or clubbing  SKIN: warm  and dry without lesions    NEURO:  alert, approp, nl sensorium with  no motor or cerebellar deficits apparent.            I personally reviewed images and agree with radiology impression as follows:   Chest CT 03/11/22  w/ contrast  1. 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 2. No evidence of metastatic disease in the chest, abdomen or pelvis. 3. Three-vessel coronary atherosclerosis. 4. Chronic mild thoracic vertebral compression fractures. 5. Aortic Atherosclerosis (ICD10-I70.0) and Emphysema (ICD10-J43.9)          Assessment:

## 2022-04-06 ENCOUNTER — Ambulatory Visit: Payer: Medicare Other

## 2022-04-06 ENCOUNTER — Other Ambulatory Visit: Payer: Medicare Other

## 2022-04-06 ENCOUNTER — Ambulatory Visit: Payer: Medicare Other | Admitting: Oncology

## 2022-04-06 DIAGNOSIS — J701 Chronic and other pulmonary manifestations due to radiation: Secondary | ICD-10-CM | POA: Diagnosis not present

## 2022-04-06 DIAGNOSIS — F419 Anxiety disorder, unspecified: Secondary | ICD-10-CM | POA: Diagnosis not present

## 2022-04-06 DIAGNOSIS — I10 Essential (primary) hypertension: Secondary | ICD-10-CM | POA: Diagnosis not present

## 2022-04-06 DIAGNOSIS — K219 Gastro-esophageal reflux disease without esophagitis: Secondary | ICD-10-CM | POA: Diagnosis not present

## 2022-04-06 DIAGNOSIS — I2510 Atherosclerotic heart disease of native coronary artery without angina pectoris: Secondary | ICD-10-CM | POA: Diagnosis not present

## 2022-04-06 DIAGNOSIS — W881XXS Exposure to radioactive isotopes, sequela: Secondary | ICD-10-CM | POA: Diagnosis not present

## 2022-04-06 DIAGNOSIS — Z7952 Long term (current) use of systemic steroids: Secondary | ICD-10-CM | POA: Diagnosis not present

## 2022-04-06 DIAGNOSIS — E785 Hyperlipidemia, unspecified: Secondary | ICD-10-CM | POA: Diagnosis not present

## 2022-04-06 DIAGNOSIS — C787 Secondary malignant neoplasm of liver and intrahepatic bile duct: Secondary | ICD-10-CM | POA: Diagnosis not present

## 2022-04-06 DIAGNOSIS — Z9981 Dependence on supplemental oxygen: Secondary | ICD-10-CM | POA: Diagnosis not present

## 2022-04-06 DIAGNOSIS — J9610 Chronic respiratory failure, unspecified whether with hypoxia or hypercapnia: Secondary | ICD-10-CM | POA: Diagnosis not present

## 2022-04-06 DIAGNOSIS — J449 Chronic obstructive pulmonary disease, unspecified: Secondary | ICD-10-CM | POA: Diagnosis not present

## 2022-04-06 DIAGNOSIS — R531 Weakness: Secondary | ICD-10-CM | POA: Diagnosis not present

## 2022-04-06 DIAGNOSIS — Z7951 Long term (current) use of inhaled steroids: Secondary | ICD-10-CM | POA: Diagnosis not present

## 2022-04-06 DIAGNOSIS — C7889 Secondary malignant neoplasm of other digestive organs: Secondary | ICD-10-CM | POA: Diagnosis not present

## 2022-04-06 DIAGNOSIS — I252 Old myocardial infarction: Secondary | ICD-10-CM | POA: Diagnosis not present

## 2022-04-06 DIAGNOSIS — C3412 Malignant neoplasm of upper lobe, left bronchus or lung: Secondary | ICD-10-CM | POA: Diagnosis not present

## 2022-04-07 ENCOUNTER — Encounter: Payer: Self-pay | Admitting: Internal Medicine

## 2022-04-07 NOTE — Assessment & Plan Note (Signed)
Rx 2lpm hs and prn daytime since 2014  -  06/13/2018   Walked on 2lpm x  3 laps @  approx 240ft each @ nl pace  stopped due to  Sob after each lap  s desat   - 05/11/2021   Walked on RA  x  1  lap(s) =  approx 250  ft  @ avg pace, stopped due to sob/ back pain with lowest 02 sats 95%    Advised: Make sure you check your oxygen saturation  AT  your highest level of activity (not after you stop)   to be sure it stays over 90% and adjust  02 flow upward to maintain this level if needed but remember to turn it back to previous settings when you stop (to conserve your supply).          Each maintenance medication was reviewed in detail including emphasizing most importantly the difference between maintenance and prns and under what circumstances the prns are to be triggered using an action plan format where appropriate.  Total time for H and P, chart review, counseling, reviewing hfa/neb/02  device(s) and generating customized AVS unique to this office visit / same day charting = 23 min

## 2022-04-07 NOTE — Assessment & Plan Note (Addendum)
Quit smoking 2009 Spirometry 02/15/2016  FEV1 0.60 (28%)  Ratio 38 with classic curvature  p am symbicort 160  - 01/08/2017    continue dulera 200 2bid  - 12/09/2017  After extensive coaching inhaler device  effectiveness =    90% with smi >add spiriva to  symbicort  - 12/12/2017 informed could not tol spiriva due to dry mouth - - 06/13/2018   continue symbicort 160 - 01/18/2020  After extensive coaching inhaler device,  effectiveness =  90%  - 06/06/2020 changed to breztri  Referred to pulmonary rehab 08/22/20  > has not heard from rehab as of 09/12/2020    - 03/20/21      alpha one AT phenotype  MM 161  Added flutter valve 05/11/2021  - 11/29/2021  After extensive coaching inhaler device,  effectiveness =  80% - 04/05/2022 added omnicef x 7 d prn purulent sputum   Group D (now reclassified as E) in terms of symptom/risk and laba/lama/ICS  therefore appropriate rx at this point >>> continue symbicort 160 (her preference) 2bid and omnicef as above  Note she's concerned she can't use medrol due to diarrhea > try depomedrol 120 to see if any better/ worse than po   F/u q 40m, sooner prn  -

## 2022-04-12 DIAGNOSIS — J449 Chronic obstructive pulmonary disease, unspecified: Secondary | ICD-10-CM | POA: Diagnosis not present

## 2022-04-24 DIAGNOSIS — C329 Malignant neoplasm of larynx, unspecified: Secondary | ICD-10-CM | POA: Diagnosis not present

## 2022-04-24 DIAGNOSIS — J441 Chronic obstructive pulmonary disease with (acute) exacerbation: Secondary | ICD-10-CM | POA: Diagnosis not present

## 2022-04-24 DIAGNOSIS — C3490 Malignant neoplasm of unspecified part of unspecified bronchus or lung: Secondary | ICD-10-CM | POA: Diagnosis not present

## 2022-04-24 DIAGNOSIS — J9611 Chronic respiratory failure with hypoxia: Secondary | ICD-10-CM | POA: Diagnosis not present

## 2022-04-27 ENCOUNTER — Other Ambulatory Visit (HOSPITAL_BASED_OUTPATIENT_CLINIC_OR_DEPARTMENT_OTHER): Payer: Self-pay | Admitting: Cardiovascular Disease

## 2022-04-27 NOTE — Telephone Encounter (Signed)
Rx(s) sent to pharmacy electronically.  

## 2022-05-06 DIAGNOSIS — Z9981 Dependence on supplemental oxygen: Secondary | ICD-10-CM | POA: Diagnosis not present

## 2022-05-06 DIAGNOSIS — J9610 Chronic respiratory failure, unspecified whether with hypoxia or hypercapnia: Secondary | ICD-10-CM | POA: Diagnosis not present

## 2022-05-06 DIAGNOSIS — I252 Old myocardial infarction: Secondary | ICD-10-CM | POA: Diagnosis not present

## 2022-05-06 DIAGNOSIS — J701 Chronic and other pulmonary manifestations due to radiation: Secondary | ICD-10-CM | POA: Diagnosis not present

## 2022-05-06 DIAGNOSIS — I10 Essential (primary) hypertension: Secondary | ICD-10-CM | POA: Diagnosis not present

## 2022-05-06 DIAGNOSIS — I2510 Atherosclerotic heart disease of native coronary artery without angina pectoris: Secondary | ICD-10-CM | POA: Diagnosis not present

## 2022-05-06 DIAGNOSIS — R531 Weakness: Secondary | ICD-10-CM | POA: Diagnosis not present

## 2022-05-06 DIAGNOSIS — J449 Chronic obstructive pulmonary disease, unspecified: Secondary | ICD-10-CM | POA: Diagnosis not present

## 2022-05-06 DIAGNOSIS — K219 Gastro-esophageal reflux disease without esophagitis: Secondary | ICD-10-CM | POA: Diagnosis not present

## 2022-05-06 DIAGNOSIS — E785 Hyperlipidemia, unspecified: Secondary | ICD-10-CM | POA: Diagnosis not present

## 2022-05-06 DIAGNOSIS — F419 Anxiety disorder, unspecified: Secondary | ICD-10-CM | POA: Diagnosis not present

## 2022-05-06 DIAGNOSIS — Z7952 Long term (current) use of systemic steroids: Secondary | ICD-10-CM | POA: Diagnosis not present

## 2022-05-06 DIAGNOSIS — W881XXS Exposure to radioactive isotopes, sequela: Secondary | ICD-10-CM | POA: Diagnosis not present

## 2022-05-06 DIAGNOSIS — C3412 Malignant neoplasm of upper lobe, left bronchus or lung: Secondary | ICD-10-CM | POA: Diagnosis not present

## 2022-05-06 DIAGNOSIS — C787 Secondary malignant neoplasm of liver and intrahepatic bile duct: Secondary | ICD-10-CM | POA: Diagnosis not present

## 2022-05-06 DIAGNOSIS — C7889 Secondary malignant neoplasm of other digestive organs: Secondary | ICD-10-CM | POA: Diagnosis not present

## 2022-05-06 DIAGNOSIS — Z7951 Long term (current) use of inhaled steroids: Secondary | ICD-10-CM | POA: Diagnosis not present

## 2022-05-13 DIAGNOSIS — J449 Chronic obstructive pulmonary disease, unspecified: Secondary | ICD-10-CM | POA: Diagnosis not present

## 2022-05-18 ENCOUNTER — Inpatient Hospital Stay: Payer: Medicare Other | Attending: Oncology

## 2022-05-18 ENCOUNTER — Inpatient Hospital Stay (HOSPITAL_BASED_OUTPATIENT_CLINIC_OR_DEPARTMENT_OTHER): Payer: Medicare Other | Admitting: Nurse Practitioner

## 2022-05-18 ENCOUNTER — Encounter: Payer: Self-pay | Admitting: Nurse Practitioner

## 2022-05-18 VITALS — BP 145/84 | HR 81 | Temp 98.2°F | Resp 18 | Ht 63.0 in | Wt 80.8 lb

## 2022-05-18 DIAGNOSIS — C7889 Secondary malignant neoplasm of other digestive organs: Secondary | ICD-10-CM | POA: Diagnosis not present

## 2022-05-18 DIAGNOSIS — C3412 Malignant neoplasm of upper lobe, left bronchus or lung: Secondary | ICD-10-CM | POA: Insufficient documentation

## 2022-05-18 DIAGNOSIS — J440 Chronic obstructive pulmonary disease with acute lower respiratory infection: Secondary | ICD-10-CM | POA: Insufficient documentation

## 2022-05-18 DIAGNOSIS — I252 Old myocardial infarction: Secondary | ICD-10-CM | POA: Diagnosis not present

## 2022-05-18 DIAGNOSIS — R0989 Other specified symptoms and signs involving the circulatory and respiratory systems: Secondary | ICD-10-CM | POA: Diagnosis not present

## 2022-05-18 DIAGNOSIS — Z86711 Personal history of pulmonary embolism: Secondary | ICD-10-CM | POA: Diagnosis not present

## 2022-05-18 DIAGNOSIS — Z9981 Dependence on supplemental oxygen: Secondary | ICD-10-CM | POA: Insufficient documentation

## 2022-05-18 DIAGNOSIS — I2510 Atherosclerotic heart disease of native coronary artery without angina pectoris: Secondary | ICD-10-CM | POA: Diagnosis not present

## 2022-05-18 DIAGNOSIS — Z79899 Other long term (current) drug therapy: Secondary | ICD-10-CM | POA: Diagnosis not present

## 2022-05-18 LAB — CMP (CANCER CENTER ONLY)
ALT: 23 U/L (ref 0–44)
AST: 21 U/L (ref 15–41)
Albumin: 4.3 g/dL (ref 3.5–5.0)
Alkaline Phosphatase: 51 U/L (ref 38–126)
Anion gap: 9 (ref 5–15)
BUN: 12 mg/dL (ref 8–23)
CO2: 32 mmol/L (ref 22–32)
Calcium: 9.5 mg/dL (ref 8.9–10.3)
Chloride: 98 mmol/L (ref 98–111)
Creatinine: 0.43 mg/dL — ABNORMAL LOW (ref 0.44–1.00)
GFR, Estimated: >60 mL/min (ref >60)
Glucose, Bld: 98 mg/dL (ref 70–99)
Potassium: 3.3 mmol/L — ABNORMAL LOW (ref 3.5–5.1)
Sodium: 139 mmol/L (ref 135–145)
Total Bilirubin: 0.5 mg/dL (ref 0.3–1.2)
Total Protein: 6.8 g/dL (ref 6.5–8.1)

## 2022-05-18 NOTE — Progress Notes (Signed)
[facility] OFFICE PROGRESS NOTE   Diagnosis: Non-small cell lung cancer  INTERVAL HISTORY:   Brooke [name] returns as scheduled.  She is currently being followed with observation.  No change in baseline respiratory status.  She reports a good appetite.  Poor energy.  No diarrhea.  Stool diameter varies.  She has occasional pain at the lower abdomen.  Objective:  Vital signs in last 24 hours:  Blood pressure (!) 145/84, pulse 81, temperature 98.2 F (36.8 C), resp. rate 18, height 5\' 3"  (1.6 m), weight 80 lb 12.8 oz (36.7 kg), SpO2 100 %.    HEENT: No thrush or ulcers. Resp: Distant breath sounds.  No respiratory distress. Cardio: Regular rate and rhythm. GI: No hepatosplenomegaly. Vascular: No leg edema.    Lab Results:  Lab Results  Component Value Date   WBC 4.1 03/16/2022   HGB 13.3 03/16/2022   HCT 40.9 03/16/2022   MCV 89.5 03/16/2022   PLT 202 03/16/2022   NEUTROABS 2.9 03/16/2022    Imaging:  No results found.  Medications: I have reviewed the patient's current medications.  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% Left lung radiation 11/08/2016 through 11/21/2016 CT chest 12/12/2016-reexpansion of left upper lobe with a decreased left upper lobe mass, unchanged mediastinal adenopathy, progression of a splenic metastasis/pancreatic tail/gastrohepatic ligament metastasis, right lower lobe pneumonia Cycle 1 Pembrolizumab 12/14/2016 Cycle 2 Pembrolizumab 01/03/2017 Cycle 3 Pembrolizumab 01/24/2017 Cycle 4 Pembrolizumab  02/12/2017 CT 03/05/2018-decrease in left upper lobe mass, mediastinal adenopathy, and splenic mass Cycle 5 pembrolizumab 03/07/2017 Cycle 6 Pembrolizumab 04/02/2017 Cycle 7 pembrolizumab 04/25/2017 Cycle 8 Pembrolizumab 05/14/2017 Cycle 9 Pembrolizumab 06/06/2017 CT 06/20/2017-mild decrease in left upper lobe mass, mild increase in paramediastinal left upper lobe nodule-radiation change?, decreased splenic metastasis Cycle 10 pembrolizumab 06/25/2017 Cycle 11 pembrolizumab 07/18/2017 Cycle 12 pembrolizumab 08/29/2017 Cycle 13 pembrolizumab 09/17/2017 Cycle 14 Pembrolizumab 10/10/2017 CT chest 10/24/2017- slight enlargement of small mediastinal lymph nodes, increased left upper lobe consolidation, decreased size of spleen lesion Cycle 15 pembrolizumab 10/29/2017 Cycle 16 Pembrolizumab 11/21/2017 Cycle 17 Pembrolizumab 12/11/2017 Cycle 18 pembrolizumab 01/02/2018 Cycle 19 Pembrolizumab 01/21/2018 Cycle 20 Pembrolizumab 02/13/2018 CT chest 02/27/2018- decreased left paratracheal node, progressive consolidation/fibrosis in the left upper lung, decreased size of splenic mass Cycle 21 pembrolizumab 03/04/2018 Cycle 22 Pembrolizumab 03/27/2018  Cycle 23 pembrolizumab 04/15/2018 Cycle 24 pembrolizumab 05/08/2018 Cycle 25 Pembrolizumab 05/27/2018 CT chest 06/16/2018- stable left upper lung radiation fibrosis with no evidence of local tumor recurrence.  Decreased left paratracheal adenopathy.  No new or progressive metastatic disease in the chest.  Gastrohepatic ligament lymphadenopathy stable.  Splenic metastasis decreased.  T5 vertebral compression fracture with associated patchy sclerosis and no discrete osseous lesion, new in the interval. Cycle 26 Pembrolizumab 06/19/2018 Cycle 27 pembrolizumab 07/08/2018 Cycle 28 pembrolizumab 07/31/2018 Cycle 29 pembrolizumab 08/19/2018 Cycle 30 Pembrolizumab 09/11/2018 Cycle 31 pembrolizumab 09/30/2018 Cycle 32 pembrolizumab 10/23/2018 CT chest 10/28/2018- left apical  pleural-parenchymal opacity consistent with radiation scarring has become more confluent likely representing evolutionary change.  Continued further decrease in size of index left paratracheal node and splenic metastasis.  Gastrohepatic ligament lymph node not changed. Cycle 33 Pembrolizumab 11/11/2018 Cycle 34 pembrolizumab 12/04/2018 Cycle 35 pembrolizumab  12/23/2018 CT chest 02/12/2019- posttreatment changes left upper lobe unchanged.  Continued decrease in size of splenic metastasis.  Stable appearance of left paratracheal lymph nodes.  Stable gastrohepatic adenopathy.  Incomplete visualization of retroperitoneal lymph nodes in the upper abdomen. CT chest 07/02/2019-stable post treatment related changes left lung.  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  Slight regression of enlarged likely metastatic gastrohepatic lymph node. CT chest 11/11/2019-interval enlargement of a right paratracheal lymph node measuring 14 mm, increased from 5 mm.  No new lung mass or nodularity.  New adenopathy in the upper abdomen.  2.7 cm lesion gastrohepatic ligament.  Necrotic node at the splenic hilum measures 4.7 cm.  Large node along the IVC left upper quadrant measures 2.7 cm.  Similar node left adjacent the aorta measures 1.8 cm.  Enhancing lesion in the spleen is unchanged.  Several low-density lesions in the liver are unchanged. Pembrolizumab resumed on a 3-week schedule 12/03/2019 CT chest 02/22/2020-interval resolution of previously demonstrated left paratracheal and upper abdominal adenopathy.  Stable treated metastasis within the spleen.  Stable radiation changes in the left lung with left hilar distortion.  No evidence of local recurrence or progressive metastatic disease. Continuation of pembrolizumab every 3 weeks 02/26/2020 CT chest 06/28/2020-no change in left upper lobe consolidation, unchanged spleen metastasis, no evidence of progressive disease Pembrolizumab continued every 3 weeks 07/01/2020 CT chest  11/02/2020-stable posttreatment changes at the left hilum and left upper lobe, decreased size of spleen lesion, no evidence of disease progression Pembrolizumab every 3 weeks continued CT chest 01/12/2021-negative for acute pulmonary embolus.  Stable radiation fibrosis left upper lobe.  Stable low-density lesion in the spleen.  No new or progressive findings. 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  Splenic lesion stable.  No upper abdominal lymphadenopathy. Pembrolizumab every 3 weeks continued CT chest 08/29/2021-unchanged consolidation/fibrosis at the left upper lung, no evidence of metastatic disease in the chest, COPD, multiple compression fractures, new T11 compression fracture, stable spleen lesion by my review Pembrolizumab every 3 weeks continued Treatment held 10/13/2021 for diarrhea, C. difficile negative Pembrolizumab 11/10/2021 Pembrolizumab 12/01/2021 CTs 12/07/2021-similar treatment effect within the left upper lobe including volume loss, traction bronchiectasis and consolidation.  No local recurrent or active metastatic disease.  Diffuse colitis most significant within the cecum.  New trace left pleural fluid. CTs 03/11/2022-stable radiation fibrosis in the upper left lung, no evidence of local tumor recurrence, no evidence of metastatic disease, normal large bowel without wall thickening     2.  05/29/2019 laryngoscopy-exophytic appearing mass mid right vocal cord.   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  Tumor cells negative for p16, CMV and HSV 2.  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 Radiation 07/29/2019-08/25/2019   3. History of acute respiratory failure secondary to #1   4. Oxygen dependent COPD    5. History of a NSTEMI 2015   6. Right lung pneumonia on chest CT 12/12/2016-treated with Levaquin   7.  Grade 2 rash possibly related to Pembrolizumab.  Treatment held 08/06/2017.  Improved 08/29/2017, treatment resumed, rash has resolved   8.  Vertigo January 2022-improved with course of Augmentin for ear/sinus infection   9.  Diarrhea beginning early June 2023; negative C. difficile 10/13/2021; CTs 12/07/2021-diffuse colitis most significant within the cecum.  Disposition:  Ms. Lindh appears unchanged.  There is no clinical evidence of disease progression.  Plan for continued observation with restaging CTs in approximately 2 months.  We will see her in follow-up a few days after CT scans.  She will contact the office in the interim with any problems.    Lonna Cobb ANP/GNP-BC   05/18/2022  9:54 AM

## 2022-05-19 LAB — COMPREHENSIVE METABOLIC PANEL
ALT: 25 IU/L (ref 0–32)
AST: 24 IU/L (ref 0–40)
Albumin/Globulin Ratio: 2.1 (ref 1.2–2.2)
Albumin: 4.5 g/dL (ref 3.8–4.8)
Alkaline Phosphatase: 61 IU/L (ref 44–121)
BUN/Creatinine Ratio: 25 (ref 12–28)
BUN: 11 mg/dL (ref 8–27)
Bilirubin Total: 0.3 mg/dL (ref 0.0–1.2)
CO2: 23 mmol/L (ref 20–29)
Calcium: 9.1 mg/dL (ref 8.7–10.3)
Chloride: 98 mmol/L (ref 96–106)
Creatinine, Ser: 0.44 mg/dL — ABNORMAL LOW (ref 0.57–1.00)
Globulin, Total: 2.1 g/dL (ref 1.5–4.5)
Glucose: 98 mg/dL (ref 70–99)
Potassium: 3.7 mmol/L (ref 3.5–5.2)
Sodium: 139 mmol/L (ref 134–144)
Total Protein: 6.6 g/dL (ref 6.0–8.5)
eGFR: 100 mL/min/{1.73_m2} (ref >59)

## 2022-05-19 LAB — LIPID PANEL
Chol/HDL Ratio: 1.8 ratio (ref 0.0–4.4)
Cholesterol, Total: 220 mg/dL — ABNORMAL HIGH (ref 100–199)
HDL: 121 mg/dL (ref >39)
LDL Chol Calc (NIH): 91 mg/dL (ref 0–99)
Triglycerides: 42 mg/dL (ref 0–149)
VLDL Cholesterol Cal: 8 mg/dL (ref 5–40)

## 2022-05-21 ENCOUNTER — Telehealth (HOSPITAL_BASED_OUTPATIENT_CLINIC_OR_DEPARTMENT_OTHER): Payer: Self-pay

## 2022-05-21 DIAGNOSIS — I2510 Atherosclerotic heart disease of native coronary artery without angina pectoris: Secondary | ICD-10-CM

## 2022-05-21 MED ORDER — ATORVASTATIN CALCIUM 20 MG PO TABS: 80.0000 mg | ORAL_TABLET | Freq: Every day | ORAL | 3 refills | Status: DC

## 2022-05-21 NOTE — Telephone Encounter (Addendum)
Results called to patient who verbalizes understanding! Rx updated and labs mailed to patient.   ----- Message from Alver Sorrow, NP sent at 05/19/2022  4:10 PM EST ----- Stable kidney function. Normal liver function and electrolytes. Good result!   Elevated total cholesterol. LDL (bad cholesterol) not at goal of less than 70. Please ensure taking Atorvastatin 40mg  daily.   If not taking regularly, resume and repeat FLP/LFT in 2-3 months. If she has been taking, increase to 80mg  and repeat FLP/LFT in 2-3 months.

## 2022-06-05 DIAGNOSIS — I2510 Atherosclerotic heart disease of native coronary artery without angina pectoris: Secondary | ICD-10-CM | POA: Diagnosis not present

## 2022-06-05 DIAGNOSIS — E785 Hyperlipidemia, unspecified: Secondary | ICD-10-CM | POA: Diagnosis not present

## 2022-06-05 DIAGNOSIS — Z9981 Dependence on supplemental oxygen: Secondary | ICD-10-CM | POA: Diagnosis not present

## 2022-06-05 DIAGNOSIS — Z7951 Long term (current) use of inhaled steroids: Secondary | ICD-10-CM | POA: Diagnosis not present

## 2022-06-05 DIAGNOSIS — R531 Weakness: Secondary | ICD-10-CM | POA: Diagnosis not present

## 2022-06-05 DIAGNOSIS — I10 Essential (primary) hypertension: Secondary | ICD-10-CM | POA: Diagnosis not present

## 2022-06-05 DIAGNOSIS — K219 Gastro-esophageal reflux disease without esophagitis: Secondary | ICD-10-CM | POA: Diagnosis not present

## 2022-06-05 DIAGNOSIS — J9610 Chronic respiratory failure, unspecified whether with hypoxia or hypercapnia: Secondary | ICD-10-CM | POA: Diagnosis not present

## 2022-06-05 DIAGNOSIS — J701 Chronic and other pulmonary manifestations due to radiation: Secondary | ICD-10-CM | POA: Diagnosis not present

## 2022-06-05 DIAGNOSIS — C787 Secondary malignant neoplasm of liver and intrahepatic bile duct: Secondary | ICD-10-CM | POA: Diagnosis not present

## 2022-06-05 DIAGNOSIS — J449 Chronic obstructive pulmonary disease, unspecified: Secondary | ICD-10-CM | POA: Diagnosis not present

## 2022-06-05 DIAGNOSIS — F419 Anxiety disorder, unspecified: Secondary | ICD-10-CM | POA: Diagnosis not present

## 2022-06-05 DIAGNOSIS — I252 Old myocardial infarction: Secondary | ICD-10-CM | POA: Diagnosis not present

## 2022-06-05 DIAGNOSIS — C7889 Secondary malignant neoplasm of other digestive organs: Secondary | ICD-10-CM | POA: Diagnosis not present

## 2022-06-05 DIAGNOSIS — K589 Irritable bowel syndrome without diarrhea: Secondary | ICD-10-CM | POA: Diagnosis not present

## 2022-06-05 DIAGNOSIS — W881XXS Exposure to radioactive isotopes, sequela: Secondary | ICD-10-CM | POA: Diagnosis not present

## 2022-06-05 DIAGNOSIS — C3412 Malignant neoplasm of upper lobe, left bronchus or lung: Secondary | ICD-10-CM | POA: Diagnosis not present

## 2022-06-06 DIAGNOSIS — J9610 Chronic respiratory failure, unspecified whether with hypoxia or hypercapnia: Secondary | ICD-10-CM | POA: Diagnosis not present

## 2022-06-06 DIAGNOSIS — C7889 Secondary malignant neoplasm of other digestive organs: Secondary | ICD-10-CM | POA: Diagnosis not present

## 2022-06-06 DIAGNOSIS — Z9981 Dependence on supplemental oxygen: Secondary | ICD-10-CM | POA: Diagnosis not present

## 2022-06-06 DIAGNOSIS — J449 Chronic obstructive pulmonary disease, unspecified: Secondary | ICD-10-CM | POA: Diagnosis not present

## 2022-06-06 DIAGNOSIS — Z7951 Long term (current) use of inhaled steroids: Secondary | ICD-10-CM | POA: Diagnosis not present

## 2022-06-06 DIAGNOSIS — R531 Weakness: Secondary | ICD-10-CM | POA: Diagnosis not present

## 2022-06-06 DIAGNOSIS — I2510 Atherosclerotic heart disease of native coronary artery without angina pectoris: Secondary | ICD-10-CM | POA: Diagnosis not present

## 2022-06-06 DIAGNOSIS — C3412 Malignant neoplasm of upper lobe, left bronchus or lung: Secondary | ICD-10-CM | POA: Diagnosis not present

## 2022-06-06 DIAGNOSIS — K589 Irritable bowel syndrome without diarrhea: Secondary | ICD-10-CM | POA: Diagnosis not present

## 2022-06-06 DIAGNOSIS — E785 Hyperlipidemia, unspecified: Secondary | ICD-10-CM | POA: Diagnosis not present

## 2022-06-06 DIAGNOSIS — C787 Secondary malignant neoplasm of liver and intrahepatic bile duct: Secondary | ICD-10-CM | POA: Diagnosis not present

## 2022-06-06 DIAGNOSIS — I252 Old myocardial infarction: Secondary | ICD-10-CM | POA: Diagnosis not present

## 2022-06-06 DIAGNOSIS — J701 Chronic and other pulmonary manifestations due to radiation: Secondary | ICD-10-CM | POA: Diagnosis not present

## 2022-06-06 DIAGNOSIS — K219 Gastro-esophageal reflux disease without esophagitis: Secondary | ICD-10-CM | POA: Diagnosis not present

## 2022-06-06 DIAGNOSIS — F419 Anxiety disorder, unspecified: Secondary | ICD-10-CM | POA: Diagnosis not present

## 2022-06-06 DIAGNOSIS — I10 Essential (primary) hypertension: Secondary | ICD-10-CM | POA: Diagnosis not present

## 2022-06-06 DIAGNOSIS — W881XXS Exposure to radioactive isotopes, sequela: Secondary | ICD-10-CM | POA: Diagnosis not present

## 2022-06-13 DIAGNOSIS — E785 Hyperlipidemia, unspecified: Secondary | ICD-10-CM | POA: Diagnosis not present

## 2022-06-13 DIAGNOSIS — K219 Gastro-esophageal reflux disease without esophagitis: Secondary | ICD-10-CM | POA: Diagnosis not present

## 2022-06-13 DIAGNOSIS — J9610 Chronic respiratory failure, unspecified whether with hypoxia or hypercapnia: Secondary | ICD-10-CM | POA: Diagnosis not present

## 2022-06-13 DIAGNOSIS — C3412 Malignant neoplasm of upper lobe, left bronchus or lung: Secondary | ICD-10-CM | POA: Diagnosis not present

## 2022-06-13 DIAGNOSIS — I10 Essential (primary) hypertension: Secondary | ICD-10-CM | POA: Diagnosis not present

## 2022-06-13 DIAGNOSIS — C7889 Secondary malignant neoplasm of other digestive organs: Secondary | ICD-10-CM | POA: Diagnosis not present

## 2022-06-13 DIAGNOSIS — F419 Anxiety disorder, unspecified: Secondary | ICD-10-CM | POA: Diagnosis not present

## 2022-06-13 DIAGNOSIS — J701 Chronic and other pulmonary manifestations due to radiation: Secondary | ICD-10-CM | POA: Diagnosis not present

## 2022-06-13 DIAGNOSIS — C787 Secondary malignant neoplasm of liver and intrahepatic bile duct: Secondary | ICD-10-CM | POA: Diagnosis not present

## 2022-06-13 DIAGNOSIS — W881XXS Exposure to radioactive isotopes, sequela: Secondary | ICD-10-CM | POA: Diagnosis not present

## 2022-06-13 DIAGNOSIS — Z9981 Dependence on supplemental oxygen: Secondary | ICD-10-CM | POA: Diagnosis not present

## 2022-06-13 DIAGNOSIS — I252 Old myocardial infarction: Secondary | ICD-10-CM | POA: Diagnosis not present

## 2022-06-13 DIAGNOSIS — J449 Chronic obstructive pulmonary disease, unspecified: Secondary | ICD-10-CM | POA: Diagnosis not present

## 2022-06-13 DIAGNOSIS — I2510 Atherosclerotic heart disease of native coronary artery without angina pectoris: Secondary | ICD-10-CM | POA: Diagnosis not present

## 2022-06-13 DIAGNOSIS — R531 Weakness: Secondary | ICD-10-CM | POA: Diagnosis not present

## 2022-06-13 DIAGNOSIS — K589 Irritable bowel syndrome without diarrhea: Secondary | ICD-10-CM | POA: Diagnosis not present

## 2022-06-13 DIAGNOSIS — Z7951 Long term (current) use of inhaled steroids: Secondary | ICD-10-CM | POA: Diagnosis not present

## 2022-06-15 DIAGNOSIS — C3412 Malignant neoplasm of upper lobe, left bronchus or lung: Secondary | ICD-10-CM | POA: Diagnosis not present

## 2022-06-15 DIAGNOSIS — I252 Old myocardial infarction: Secondary | ICD-10-CM | POA: Diagnosis not present

## 2022-06-15 DIAGNOSIS — I10 Essential (primary) hypertension: Secondary | ICD-10-CM | POA: Diagnosis not present

## 2022-06-15 DIAGNOSIS — J449 Chronic obstructive pulmonary disease, unspecified: Secondary | ICD-10-CM | POA: Diagnosis not present

## 2022-06-15 DIAGNOSIS — F419 Anxiety disorder, unspecified: Secondary | ICD-10-CM | POA: Diagnosis not present

## 2022-06-15 DIAGNOSIS — E785 Hyperlipidemia, unspecified: Secondary | ICD-10-CM | POA: Diagnosis not present

## 2022-06-15 DIAGNOSIS — K219 Gastro-esophageal reflux disease without esophagitis: Secondary | ICD-10-CM | POA: Diagnosis not present

## 2022-06-15 DIAGNOSIS — C7889 Secondary malignant neoplasm of other digestive organs: Secondary | ICD-10-CM | POA: Diagnosis not present

## 2022-06-15 DIAGNOSIS — Z9981 Dependence on supplemental oxygen: Secondary | ICD-10-CM | POA: Diagnosis not present

## 2022-06-15 DIAGNOSIS — K589 Irritable bowel syndrome without diarrhea: Secondary | ICD-10-CM | POA: Diagnosis not present

## 2022-06-15 DIAGNOSIS — I2510 Atherosclerotic heart disease of native coronary artery without angina pectoris: Secondary | ICD-10-CM | POA: Diagnosis not present

## 2022-06-15 DIAGNOSIS — C787 Secondary malignant neoplasm of liver and intrahepatic bile duct: Secondary | ICD-10-CM | POA: Diagnosis not present

## 2022-06-15 DIAGNOSIS — J9610 Chronic respiratory failure, unspecified whether with hypoxia or hypercapnia: Secondary | ICD-10-CM | POA: Diagnosis not present

## 2022-06-15 DIAGNOSIS — W881XXS Exposure to radioactive isotopes, sequela: Secondary | ICD-10-CM | POA: Diagnosis not present

## 2022-06-15 DIAGNOSIS — J701 Chronic and other pulmonary manifestations due to radiation: Secondary | ICD-10-CM | POA: Diagnosis not present

## 2022-06-15 DIAGNOSIS — Z7951 Long term (current) use of inhaled steroids: Secondary | ICD-10-CM | POA: Diagnosis not present

## 2022-06-15 DIAGNOSIS — R531 Weakness: Secondary | ICD-10-CM | POA: Diagnosis not present

## 2022-06-20 DIAGNOSIS — F419 Anxiety disorder, unspecified: Secondary | ICD-10-CM | POA: Diagnosis not present

## 2022-06-20 DIAGNOSIS — C7889 Secondary malignant neoplasm of other digestive organs: Secondary | ICD-10-CM | POA: Diagnosis not present

## 2022-06-20 DIAGNOSIS — I2510 Atherosclerotic heart disease of native coronary artery without angina pectoris: Secondary | ICD-10-CM | POA: Diagnosis not present

## 2022-06-20 DIAGNOSIS — C787 Secondary malignant neoplasm of liver and intrahepatic bile duct: Secondary | ICD-10-CM | POA: Diagnosis not present

## 2022-06-20 DIAGNOSIS — W881XXS Exposure to radioactive isotopes, sequela: Secondary | ICD-10-CM | POA: Diagnosis not present

## 2022-06-20 DIAGNOSIS — J9610 Chronic respiratory failure, unspecified whether with hypoxia or hypercapnia: Secondary | ICD-10-CM | POA: Diagnosis not present

## 2022-06-20 DIAGNOSIS — J449 Chronic obstructive pulmonary disease, unspecified: Secondary | ICD-10-CM | POA: Diagnosis not present

## 2022-06-20 DIAGNOSIS — Z7951 Long term (current) use of inhaled steroids: Secondary | ICD-10-CM | POA: Diagnosis not present

## 2022-06-20 DIAGNOSIS — R531 Weakness: Secondary | ICD-10-CM | POA: Diagnosis not present

## 2022-06-20 DIAGNOSIS — K589 Irritable bowel syndrome without diarrhea: Secondary | ICD-10-CM | POA: Diagnosis not present

## 2022-06-20 DIAGNOSIS — Z9981 Dependence on supplemental oxygen: Secondary | ICD-10-CM | POA: Diagnosis not present

## 2022-06-20 DIAGNOSIS — K219 Gastro-esophageal reflux disease without esophagitis: Secondary | ICD-10-CM | POA: Diagnosis not present

## 2022-06-20 DIAGNOSIS — C3412 Malignant neoplasm of upper lobe, left bronchus or lung: Secondary | ICD-10-CM | POA: Diagnosis not present

## 2022-06-20 DIAGNOSIS — J701 Chronic and other pulmonary manifestations due to radiation: Secondary | ICD-10-CM | POA: Diagnosis not present

## 2022-06-20 DIAGNOSIS — I252 Old myocardial infarction: Secondary | ICD-10-CM | POA: Diagnosis not present

## 2022-06-20 DIAGNOSIS — I10 Essential (primary) hypertension: Secondary | ICD-10-CM | POA: Diagnosis not present

## 2022-06-20 DIAGNOSIS — E785 Hyperlipidemia, unspecified: Secondary | ICD-10-CM | POA: Diagnosis not present

## 2022-06-28 DIAGNOSIS — K589 Irritable bowel syndrome without diarrhea: Secondary | ICD-10-CM | POA: Diagnosis not present

## 2022-06-28 DIAGNOSIS — R531 Weakness: Secondary | ICD-10-CM | POA: Diagnosis not present

## 2022-06-28 DIAGNOSIS — I252 Old myocardial infarction: Secondary | ICD-10-CM | POA: Diagnosis not present

## 2022-06-28 DIAGNOSIS — W881XXS Exposure to radioactive isotopes, sequela: Secondary | ICD-10-CM | POA: Diagnosis not present

## 2022-06-28 DIAGNOSIS — J701 Chronic and other pulmonary manifestations due to radiation: Secondary | ICD-10-CM | POA: Diagnosis not present

## 2022-06-28 DIAGNOSIS — C7889 Secondary malignant neoplasm of other digestive organs: Secondary | ICD-10-CM | POA: Diagnosis not present

## 2022-06-28 DIAGNOSIS — I10 Essential (primary) hypertension: Secondary | ICD-10-CM | POA: Diagnosis not present

## 2022-06-28 DIAGNOSIS — F419 Anxiety disorder, unspecified: Secondary | ICD-10-CM | POA: Diagnosis not present

## 2022-06-28 DIAGNOSIS — Z7951 Long term (current) use of inhaled steroids: Secondary | ICD-10-CM | POA: Diagnosis not present

## 2022-06-28 DIAGNOSIS — I2510 Atherosclerotic heart disease of native coronary artery without angina pectoris: Secondary | ICD-10-CM | POA: Diagnosis not present

## 2022-06-28 DIAGNOSIS — Z9981 Dependence on supplemental oxygen: Secondary | ICD-10-CM | POA: Diagnosis not present

## 2022-06-28 DIAGNOSIS — C787 Secondary malignant neoplasm of liver and intrahepatic bile duct: Secondary | ICD-10-CM | POA: Diagnosis not present

## 2022-06-28 DIAGNOSIS — C3412 Malignant neoplasm of upper lobe, left bronchus or lung: Secondary | ICD-10-CM | POA: Diagnosis not present

## 2022-06-28 DIAGNOSIS — J9610 Chronic respiratory failure, unspecified whether with hypoxia or hypercapnia: Secondary | ICD-10-CM | POA: Diagnosis not present

## 2022-06-28 DIAGNOSIS — E785 Hyperlipidemia, unspecified: Secondary | ICD-10-CM | POA: Diagnosis not present

## 2022-06-28 DIAGNOSIS — J449 Chronic obstructive pulmonary disease, unspecified: Secondary | ICD-10-CM | POA: Diagnosis not present

## 2022-06-28 DIAGNOSIS — K219 Gastro-esophageal reflux disease without esophagitis: Secondary | ICD-10-CM | POA: Diagnosis not present

## 2022-07-03 DIAGNOSIS — C3412 Malignant neoplasm of upper lobe, left bronchus or lung: Secondary | ICD-10-CM | POA: Diagnosis not present

## 2022-07-03 DIAGNOSIS — K219 Gastro-esophageal reflux disease without esophagitis: Secondary | ICD-10-CM | POA: Diagnosis not present

## 2022-07-03 DIAGNOSIS — F419 Anxiety disorder, unspecified: Secondary | ICD-10-CM | POA: Diagnosis not present

## 2022-07-03 DIAGNOSIS — J9610 Chronic respiratory failure, unspecified whether with hypoxia or hypercapnia: Secondary | ICD-10-CM | POA: Diagnosis not present

## 2022-07-03 DIAGNOSIS — Z7951 Long term (current) use of inhaled steroids: Secondary | ICD-10-CM | POA: Diagnosis not present

## 2022-07-03 DIAGNOSIS — J449 Chronic obstructive pulmonary disease, unspecified: Secondary | ICD-10-CM | POA: Diagnosis not present

## 2022-07-03 DIAGNOSIS — J701 Chronic and other pulmonary manifestations due to radiation: Secondary | ICD-10-CM | POA: Diagnosis not present

## 2022-07-03 DIAGNOSIS — C787 Secondary malignant neoplasm of liver and intrahepatic bile duct: Secondary | ICD-10-CM | POA: Diagnosis not present

## 2022-07-03 DIAGNOSIS — K589 Irritable bowel syndrome without diarrhea: Secondary | ICD-10-CM | POA: Diagnosis not present

## 2022-07-03 DIAGNOSIS — Z9981 Dependence on supplemental oxygen: Secondary | ICD-10-CM | POA: Diagnosis not present

## 2022-07-03 DIAGNOSIS — I252 Old myocardial infarction: Secondary | ICD-10-CM | POA: Diagnosis not present

## 2022-07-03 DIAGNOSIS — E785 Hyperlipidemia, unspecified: Secondary | ICD-10-CM | POA: Diagnosis not present

## 2022-07-03 DIAGNOSIS — W881XXS Exposure to radioactive isotopes, sequela: Secondary | ICD-10-CM | POA: Diagnosis not present

## 2022-07-03 DIAGNOSIS — R531 Weakness: Secondary | ICD-10-CM | POA: Diagnosis not present

## 2022-07-03 DIAGNOSIS — C7889 Secondary malignant neoplasm of other digestive organs: Secondary | ICD-10-CM | POA: Diagnosis not present

## 2022-07-03 DIAGNOSIS — I2510 Atherosclerotic heart disease of native coronary artery without angina pectoris: Secondary | ICD-10-CM | POA: Diagnosis not present

## 2022-07-03 DIAGNOSIS — I10 Essential (primary) hypertension: Secondary | ICD-10-CM | POA: Diagnosis not present

## 2022-07-05 ENCOUNTER — Telehealth (HOSPITAL_BASED_OUTPATIENT_CLINIC_OR_DEPARTMENT_OTHER): Payer: Self-pay

## 2022-07-05 DIAGNOSIS — J701 Chronic and other pulmonary manifestations due to radiation: Secondary | ICD-10-CM | POA: Diagnosis not present

## 2022-07-05 DIAGNOSIS — I2510 Atherosclerotic heart disease of native coronary artery without angina pectoris: Secondary | ICD-10-CM | POA: Diagnosis not present

## 2022-07-05 DIAGNOSIS — I10 Essential (primary) hypertension: Secondary | ICD-10-CM | POA: Diagnosis not present

## 2022-07-05 DIAGNOSIS — J9610 Chronic respiratory failure, unspecified whether with hypoxia or hypercapnia: Secondary | ICD-10-CM | POA: Diagnosis not present

## 2022-07-05 DIAGNOSIS — K219 Gastro-esophageal reflux disease without esophagitis: Secondary | ICD-10-CM | POA: Diagnosis not present

## 2022-07-05 DIAGNOSIS — F419 Anxiety disorder, unspecified: Secondary | ICD-10-CM | POA: Diagnosis not present

## 2022-07-05 DIAGNOSIS — C7889 Secondary malignant neoplasm of other digestive organs: Secondary | ICD-10-CM | POA: Diagnosis not present

## 2022-07-05 DIAGNOSIS — Z9981 Dependence on supplemental oxygen: Secondary | ICD-10-CM | POA: Diagnosis not present

## 2022-07-05 DIAGNOSIS — C3412 Malignant neoplasm of upper lobe, left bronchus or lung: Secondary | ICD-10-CM | POA: Diagnosis not present

## 2022-07-05 DIAGNOSIS — Z7951 Long term (current) use of inhaled steroids: Secondary | ICD-10-CM | POA: Diagnosis not present

## 2022-07-05 DIAGNOSIS — K589 Irritable bowel syndrome without diarrhea: Secondary | ICD-10-CM | POA: Diagnosis not present

## 2022-07-05 DIAGNOSIS — I252 Old myocardial infarction: Secondary | ICD-10-CM | POA: Diagnosis not present

## 2022-07-05 DIAGNOSIS — W881XXS Exposure to radioactive isotopes, sequela: Secondary | ICD-10-CM | POA: Diagnosis not present

## 2022-07-05 DIAGNOSIS — C787 Secondary malignant neoplasm of liver and intrahepatic bile duct: Secondary | ICD-10-CM | POA: Diagnosis not present

## 2022-07-05 DIAGNOSIS — R531 Weakness: Secondary | ICD-10-CM | POA: Diagnosis not present

## 2022-07-05 DIAGNOSIS — E785 Hyperlipidemia, unspecified: Secondary | ICD-10-CM | POA: Diagnosis not present

## 2022-07-05 DIAGNOSIS — J449 Chronic obstructive pulmonary disease, unspecified: Secondary | ICD-10-CM | POA: Diagnosis not present

## 2022-07-05 NOTE — Telephone Encounter (Signed)
Received fax from [facility] in [place] requesting Prior Authorization for Atorvastatin 80 mg.   Routing to Prior [employer] to assist. Thank you!

## 2022-07-06 ENCOUNTER — Other Ambulatory Visit (HOSPITAL_COMMUNITY): Payer: Self-pay

## 2022-07-06 NOTE — Telephone Encounter (Addendum)
Pharmacy Patient Advocate Encounter   Received notification from [place] that prior authorization for ATORVASTATIN 20 MG (4 tabs daily) is needed.    PA submitted on 07/06/22 VIA BLUE E  Status is pending  Karie Soda, [place] Patient Advocate Specialist Direct Number: [phone] Fax: [phone]

## 2022-07-06 NOTE — Telephone Encounter (Signed)
So when I discussed the change with RPH, we addressed the fact that the Crestor tablets are significantly smaller than the Atorvastatin 20 mg tabs. Is the patient aware of this? If swallowing is an issue it would likely be beneficial to her to switch.

## 2022-07-06 NOTE — Telephone Encounter (Signed)
Per PharmD, D/c Atorv '80mg'$  and switch to Rosuvastatin '40mg'$  daily for easier swallowing.    Called patient to inform her of the change, patient states that she will not make this switch. She states she only wants to stay on the Atorvastatin '20mg'$  ( 4 tablets) daily for the '80mg'$ . 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 She does not want to switch medications and wants an update when the Atorvastatin '20mg'$  tablets (4) have been sent back to the pharmacy.

## 2022-07-06 NOTE — Addendum Note (Signed)
Addended by: Gerald Stabs on: 07/06/2022 01:27 PM   Modules accepted: Orders

## 2022-07-06 NOTE — Telephone Encounter (Addendum)
Per test claim PA is not needed for Crestor.     [place] please advise if change is appropriate and if so please send patient's pharmacy a new prescription.   Karie Soda, CPhT Pharmacy Patient Advocate Specialist Direct Number: [phone] Fax: [phone]

## 2022-07-06 NOTE — Telephone Encounter (Signed)
No just one '40mg'$  rosuvastatin tablet per day

## 2022-07-06 NOTE — Telephone Encounter (Signed)
Patient has swallowing issues. Recommend switching to rosuvastatin 40. Atorvastatin '80mg'$  are very large.

## 2022-07-09 ENCOUNTER — Other Ambulatory Visit: Payer: Self-pay | Admitting: Cardiovascular Disease

## 2022-07-09 ENCOUNTER — Telehealth: Payer: Self-pay

## 2022-07-09 MED ORDER — ATORVASTATIN CALCIUM 20 MG PO TABS: 80.0000 mg | ORAL_TABLET | Freq: Every day | ORAL | 3 refills | Status: DC

## 2022-07-09 NOTE — Telephone Encounter (Signed)
Pharmacy Patient Advocate Encounter  Prior Authorization for ATORVASTATIN 20 MG (4TAB) has been approved.    Effective dates: 07/09/22 through 07/09/23  Karie Soda, [place] Patient Advocate Specialist Direct Number: [phone] Fax: [phone]

## 2022-07-09 NOTE — Addendum Note (Signed)
Addended by: Gerald Stabs on: 07/09/2022 10:33 AM   Modules accepted: Orders

## 2022-07-09 NOTE — Telephone Encounter (Signed)
Rx request sent to pharmacy.  

## 2022-07-09 NOTE — Telephone Encounter (Signed)
Returned call to patient, no answer left message informing patient of insurance approval and RX to pharm!

## 2022-07-10 ENCOUNTER — Ambulatory Visit (HOSPITAL_BASED_OUTPATIENT_CLINIC_OR_DEPARTMENT_OTHER)
Admission: RE | Admit: 2022-07-10 | Discharge: 2022-07-10 | Disposition: A | Discharge status: Home / Self Care | Payer: Medicare Other | Source: Ambulatory Visit | Attending: Nurse Practitioner | Admitting: Nurse Practitioner

## 2022-07-10 ENCOUNTER — Encounter (HOSPITAL_BASED_OUTPATIENT_CLINIC_OR_DEPARTMENT_OTHER): Payer: Self-pay

## 2022-07-10 ENCOUNTER — Inpatient Hospital Stay: Payer: Medicare Other | Attending: Oncology

## 2022-07-10 DIAGNOSIS — C7800 Secondary malignant neoplasm of unspecified lung: Secondary | ICD-10-CM | POA: Diagnosis not present

## 2022-07-10 DIAGNOSIS — C7889 Secondary malignant neoplasm of other digestive organs: Secondary | ICD-10-CM | POA: Diagnosis not present

## 2022-07-10 DIAGNOSIS — Z9981 Dependence on supplemental oxygen: Secondary | ICD-10-CM | POA: Insufficient documentation

## 2022-07-10 DIAGNOSIS — K529 Noninfective gastroenteritis and colitis, unspecified: Secondary | ICD-10-CM | POA: Insufficient documentation

## 2022-07-10 DIAGNOSIS — C3412 Malignant neoplasm of upper lobe, left bronchus or lung: Secondary | ICD-10-CM | POA: Diagnosis not present

## 2022-07-10 DIAGNOSIS — R601 Generalized edema: Secondary | ICD-10-CM | POA: Insufficient documentation

## 2022-07-10 DIAGNOSIS — I700 Atherosclerosis of aorta: Secondary | ICD-10-CM | POA: Insufficient documentation

## 2022-07-10 DIAGNOSIS — J189 Pneumonia, unspecified organism: Secondary | ICD-10-CM | POA: Diagnosis not present

## 2022-07-10 DIAGNOSIS — I3139 Other pericardial effusion (noninflammatory): Secondary | ICD-10-CM | POA: Insufficient documentation

## 2022-07-10 DIAGNOSIS — I252 Old myocardial infarction: Secondary | ICD-10-CM | POA: Diagnosis not present

## 2022-07-10 DIAGNOSIS — Z86711 Personal history of pulmonary embolism: Secondary | ICD-10-CM | POA: Insufficient documentation

## 2022-07-10 DIAGNOSIS — Z79899 Other long term (current) drug therapy: Secondary | ICD-10-CM | POA: Insufficient documentation

## 2022-07-10 DIAGNOSIS — J432 Centrilobular emphysema: Secondary | ICD-10-CM | POA: Diagnosis not present

## 2022-07-10 DIAGNOSIS — K769 Liver disease, unspecified: Secondary | ICD-10-CM | POA: Diagnosis not present

## 2022-07-10 DIAGNOSIS — M4854XA Collapsed vertebra, not elsewhere classified, thoracic region, initial encounter for fracture: Secondary | ICD-10-CM | POA: Insufficient documentation

## 2022-07-10 DIAGNOSIS — C801 Malignant (primary) neoplasm, unspecified: Secondary | ICD-10-CM | POA: Diagnosis not present

## 2022-07-10 DIAGNOSIS — J470 Bronchiectasis with acute lower respiratory infection: Secondary | ICD-10-CM | POA: Insufficient documentation

## 2022-07-10 LAB — CMP (CANCER CENTER ONLY)
ALT: 25 U/L (ref 0–44)
AST: 21 U/L (ref 15–41)
Albumin: 4.2 g/dL (ref 3.5–5.0)
Alkaline Phosphatase: 53 U/L (ref 38–126)
Anion gap: 6 (ref 5–15)
BUN: 10 mg/dL (ref 8–23)
CO2: 33 mmol/L — ABNORMAL HIGH (ref 22–32)
Calcium: 9.6 mg/dL (ref 8.9–10.3)
Chloride: 98 mmol/L (ref 98–111)
Creatinine: 0.4 mg/dL — ABNORMAL LOW (ref 0.44–1.00)
GFR, Estimated: >60 mL/min (ref >60)
Glucose, Bld: 92 mg/dL (ref 70–99)
Potassium: 3.9 mmol/L (ref 3.5–5.1)
Sodium: 137 mmol/L (ref 135–145)
Total Bilirubin: 0.5 mg/dL (ref 0.3–1.2)
Total Protein: 7 g/dL (ref 6.5–8.1)

## 2022-07-10 MED ORDER — IOHEXOL 350 MG/ML SOLN
100.0000 mL | Freq: Once | INTRAVENOUS | Status: AC | PRN
  Administered 2022-07-10: 50 mL via INTRAVENOUS

## 2022-07-12 ENCOUNTER — Inpatient Hospital Stay (HOSPITAL_BASED_OUTPATIENT_CLINIC_OR_DEPARTMENT_OTHER): Payer: Medicare Other | Admitting: Oncology

## 2022-07-12 VITALS — BP 150/88 | HR 87 | Temp 98.1°F | Resp 20 | Ht 63.0 in | Wt 78.2 lb

## 2022-07-12 DIAGNOSIS — C7889 Secondary malignant neoplasm of other digestive organs: Secondary | ICD-10-CM | POA: Diagnosis not present

## 2022-07-12 DIAGNOSIS — I3139 Other pericardial effusion (noninflammatory): Secondary | ICD-10-CM | POA: Diagnosis not present

## 2022-07-12 DIAGNOSIS — R601 Generalized edema: Secondary | ICD-10-CM | POA: Diagnosis not present

## 2022-07-12 DIAGNOSIS — K529 Noninfective gastroenteritis and colitis, unspecified: Secondary | ICD-10-CM | POA: Diagnosis not present

## 2022-07-12 DIAGNOSIS — J449 Chronic obstructive pulmonary disease, unspecified: Secondary | ICD-10-CM | POA: Diagnosis not present

## 2022-07-12 DIAGNOSIS — I700 Atherosclerosis of aorta: Secondary | ICD-10-CM | POA: Diagnosis not present

## 2022-07-12 DIAGNOSIS — M4854XA Collapsed vertebra, not elsewhere classified, thoracic region, initial encounter for fracture: Secondary | ICD-10-CM | POA: Diagnosis not present

## 2022-07-12 DIAGNOSIS — C3412 Malignant neoplasm of upper lobe, left bronchus or lung: Secondary | ICD-10-CM

## 2022-07-12 DIAGNOSIS — Z86711 Personal history of pulmonary embolism: Secondary | ICD-10-CM | POA: Diagnosis not present

## 2022-07-12 DIAGNOSIS — I252 Old myocardial infarction: Secondary | ICD-10-CM | POA: Diagnosis not present

## 2022-07-12 DIAGNOSIS — J470 Bronchiectasis with acute lower respiratory infection: Secondary | ICD-10-CM | POA: Diagnosis not present

## 2022-07-12 DIAGNOSIS — Z9981 Dependence on supplemental oxygen: Secondary | ICD-10-CM | POA: Diagnosis not present

## 2022-07-12 DIAGNOSIS — J189 Pneumonia, unspecified organism: Secondary | ICD-10-CM | POA: Diagnosis not present

## 2022-07-12 DIAGNOSIS — J432 Centrilobular emphysema: Secondary | ICD-10-CM | POA: Diagnosis not present

## 2022-07-12 DIAGNOSIS — Z79899 Other long term (current) drug therapy: Secondary | ICD-10-CM | POA: Diagnosis not present

## 2022-07-12 NOTE — Progress Notes (Signed)
[place] OFFICE PROGRESS NOTE   Diagnosis: Non-small cell lung cancer  INTERVAL HISTORY:   Brooke [name] returns as scheduled.  She reports increased dyspnea.  She has abdominal discomfort prior to a bowel movement.  She reports feeling stool moving through the left upper abdomen.  The stool is thin.  No diarrhea.  Objective:  Vital signs in last 24 hours:  Blood pressure (!) 150/88, pulse 87, temperature 98.1 F (36.7 C), temperature source Oral, resp. rate 20, height '5\' 3"'$  (1.6 m), weight 78 lb 3.2 oz (35.5 kg), SpO2 100 %.    Lymphatics: No cervical, supraclavicular, axillary, or inguinal nodes Resp: Lungs clear bilaterally Cardio: Regular rate and rhythm GI: No mass, no hepatosplenomegaly, nontender Vascular: No leg edema   Lab Results:  Lab Results  Component Value Date   WBC 4.1 03/16/2022   HGB 13.3 03/16/2022   HCT 40.9 03/16/2022   MCV 89.5 03/16/2022   PLT 202 03/16/2022   NEUTROABS 2.9 03/16/2022    CMP  Lab Results  Component Value Date   NA 137 07/10/2022   K 3.9 07/10/2022   CL 98 07/10/2022   CO2 33 (H) 07/10/2022   GLUCOSE 92 07/10/2022   BUN 10 07/10/2022   CREATININE 0.40 (L) 07/10/2022   CALCIUM 9.6 07/10/2022   PROT 7.0 07/10/2022   ALBUMIN 4.2 07/10/2022   AST 21 07/10/2022   ALT 25 07/10/2022   ALKPHOS 53 07/10/2022   BILITOT 0.5 07/10/2022   GFRNONAA >60 07/10/2022   GFRAA >60 02/05/2020    No results found for: "CEA1", "CEA", "[id number]", "CA125"  Lab Results  Component Value Date   INR 0.99 11/01/2016   LABPROT 13.1 11/01/2016    Imaging:  CT CHEST ABDOMEN PELVIS W CONTRAST  Result Date: 07/11/2022 CLINICAL DATA:  Non-small-cell lung cancer metastatic disease. Assess treatment response. * Tracking Code: BO * EXAM: CT CHEST, ABDOMEN, AND PELVIS WITH CONTRAST TECHNIQUE: Multidetector CT imaging of the chest, abdomen and pelvis was performed following the standard protocol during bolus administration of  intravenous contrast. RADIATION DOSE REDUCTION: This exam was performed according to the departmental dose-optimization program which includes automated exposure control, adjustment of the mA and/or kV according to patient size and/or use of iterative reconstruction technique. CONTRAST:  53m OMNIPAQUE IOHEXOL 350 MG/ML SOLN COMPARISON:  CT 03/11/2022 and older FINDINGS: CT CHEST FINDINGS Cardiovascular: Heart is nonenlarged. Small pericardial effusion. Coronary artery calcifications are seen. Thoracic aorta has a normal course and caliber with scattered vascular calcifications. Mediastinum/Nodes: Thyroid gland is unremarkable. Slightly patulous thoracic esophagus. No specific abnormal lymph node enlargement present in the axillary regions, hilum or mediastinum. Lungs/Pleura: Right lung has hyperinflation with centrilobular emphysematous changes, greatest in the upper lung zones. Right lung is without consolidation, pneumothorax or effusion. There is a small nodule along the medial aspect of the middle lobe. Previously this measured 4 mm and today unchanged on series 4, image 68. Tiny focus just lateral to this is also stable measuring 2 mm. There is volume loss along the left hemithorax with well-defined opacity along the left lung apex with distorted bronchi which are enlarged and pleural thickening. Retraction superiorly of the hilum this volume loss. There is some loculated fluid as well. Appearance is unchanged from previous. No new soft tissue mass. Few punctate calcifications as well. Musculoskeletal: Scattered degenerative changes along the spine. There are compression deformities along the spine T5, T8 and T11, unchanged. Minimal compression of the superior endplate of T3 as well stable. CT  ABDOMEN PELVIS FINDINGS Hepatobiliary: Stable hepatic benign cystic lesions. There is an area of isolated biliary ductal dilatation involving segment 5, unchanged from previous. Gallbladder is contracted. No new  space-occupying liver lesion. Patent portal vein. Pancreas: Unremarkable. No pancreatic ductal dilatation or surrounding inflammatory changes. Spleen: Stable complex cystic and calcified lesion involving the spleen in terms of size. The area appears more dense today. This measures 20 mm in maximal dimension the current examination. Adrenals/Urinary Tract: Adrenal glands are preserved. No enhancing renal mass or collecting system dilatation. No collecting system dilatation. Preserved contours of the urinary bladder. Stomach/Bowel: Moderate colonic stool. Few loops of colon are distended but unchanged from previous. Otherwise the bowel is nondilated. No obstruction. Stomach is underdistended. No free air or free fluid. Vascular/Lymphatic: Diffuse vascular calcifications. Normal caliber aorta and IVC. Reproductive: Uterus and bilateral adnexa are unremarkable. Other: Anasarca. Musculoskeletal: Schmorl's node changes at the L3 vertebral level. Scattered degenerative changes of the spine and pelvis. IMPRESSION: 1. Stable volume loss along the left hemithorax with apical opacity, loculated fluid and bronchiectasis. There is retraction of the hilum. 2. Stable small right middle lobe nodules. Simple attention on follow-up. 3. Stable complex cystic and calcified lesion involving the spleen. The area appears slightly more dense today. 4. Persistent isolated biliary ductal dilatation in segment 5. Recommend either short-term follow-up in 3-6 months or additional workup with dynamic MRI. 5. Significant colonic stool. 6. Stable mild areas compression along the thoracic spine. Aortic Atherosclerosis (ICD10-I70.0) and Emphysema (ICD10-J43.9). Electronically Signed   By: Jill Side M.D.   On: 07/11/2022 10:30    Medications: I have reviewed the patient's current medications.   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 60% Left lung radiation 11/08/2016 through 11/21/2016 CT chest 12/12/2016-reexpansion of left upper lobe with a decreased left upper lobe mass, unchanged mediastinal adenopathy, progression of a splenic metastasis/pancreatic tail/gastrohepatic ligament metastasis, right lower lobe pneumonia Cycle 1 Pembrolizumab 12/14/2016 Cycle 2 Pembrolizumab 01/03/2017 Cycle 3 Pembrolizumab 01/24/2017 Cycle 4 Pembrolizumab 02/12/2017 CT 03/05/2018-decrease in left upper lobe mass, mediastinal adenopathy, and splenic mass Cycle 5 pembrolizumab 03/07/2017 Cycle 6 Pembrolizumab 04/02/2017 Cycle 7 pembrolizumab 04/25/2017 Cycle 8 Pembrolizumab 05/14/2017 Cycle 9 Pembrolizumab 06/06/2017 CT 06/20/2017-mild decrease in left upper lobe mass, mild increase in paramediastinal left upper lobe nodule-radiation change?, decreased splenic metastasis Cycle 10 pembrolizumab 06/25/2017 Cycle 11 pembrolizumab 07/18/2017 Cycle 12 pembrolizumab 08/29/2017 Cycle 13 pembrolizumab 09/17/2017 Cycle 14 Pembrolizumab 10/10/2017 CT chest 10/24/2017- slight enlargement of small mediastinal lymph nodes, increased left upper lobe consolidation, decreased size of spleen lesion Cycle 15 pembrolizumab 10/29/2017 Cycle 16 Pembrolizumab 11/21/2017 Cycle 17 Pembrolizumab 12/11/2017 Cycle 18 pembrolizumab 01/02/2018 Cycle 19 Pembrolizumab 01/21/2018 Cycle 20 Pembrolizumab 02/13/2018 CT chest 02/27/2018- decreased left paratracheal node, progressive consolidation/fibrosis in the left upper lung, decreased size of splenic mass Cycle 21 pembrolizumab 03/04/2018 Cycle 22 Pembrolizumab 03/27/2018  Cycle 23 pembrolizumab 04/15/2018 Cycle 24  pembrolizumab 05/08/2018 Cycle 25 Pembrolizumab 05/27/2018 CT chest 06/16/2018- stable left upper lung radiation fibrosis with no evidence of local tumor recurrence.  Decreased left paratracheal adenopathy.  No new or progressive metastatic disease in the chest.  Gastrohepatic ligament lymphadenopathy stable.  Splenic metastasis decreased.  T5 vertebral compression fracture  with associated patchy sclerosis and no discrete osseous lesion, new in the interval. Cycle 26 Pembrolizumab 06/19/2018 Cycle 27 pembrolizumab 07/08/2018 Cycle 28 pembrolizumab 07/31/2018 Cycle 29 pembrolizumab 08/19/2018 Cycle 30 Pembrolizumab 09/11/2018 Cycle 31 pembrolizumab 09/30/2018 Cycle 32 pembrolizumab 10/23/2018 CT chest 10/28/2018- left apical pleural-parenchymal opacity consistent with radiation scarring has become more confluent likely representing evolutionary change.  Continued further decrease in size of index left paratracheal node and splenic metastasis.  Gastrohepatic ligament lymph node not changed. Cycle 33 Pembrolizumab 11/11/2018 Cycle 34 pembrolizumab 12/04/2018 Cycle 35 pembrolizumab 12/23/2018 CT chest 02/12/2019- posttreatment changes left upper lobe unchanged.  Continued decrease in size of splenic metastasis.  Stable appearance of left paratracheal lymph nodes.  Stable gastrohepatic adenopathy.  Incomplete visualization of retroperitoneal lymph nodes in the upper abdomen. CT chest 07/02/2019-stable post treatment related changes left lung.  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  Slight regression of enlarged likely metastatic gastrohepatic lymph node. CT chest 11/11/2019-interval enlargement of a right paratracheal lymph node measuring 14 mm, increased from 5 mm.  No new lung mass or nodularity.  New adenopathy in the upper abdomen.  2.7 cm lesion gastrohepatic ligament.  Necrotic node at the splenic hilum measures 4.7 cm.  Large node along the IVC left upper quadrant measures 2.7 cm.  Similar node left adjacent the  aorta measures 1.8 cm.  Enhancing lesion in the spleen is unchanged.  Several low-density lesions in the liver are unchanged. Pembrolizumab resumed on a 3-week schedule 12/03/2019 CT chest 02/22/2020-interval resolution of previously demonstrated left paratracheal and upper abdominal adenopathy.  Stable treated metastasis within the spleen.  Stable radiation changes in the left lung with left hilar distortion.  No evidence of local recurrence or progressive metastatic disease. Continuation of pembrolizumab every 3 weeks 02/26/2020 CT chest 06/28/2020-no change in left upper lobe consolidation, unchanged spleen metastasis, no evidence of progressive disease Pembrolizumab continued every 3 weeks 07/01/2020 CT chest 11/02/2020-stable posttreatment changes at the left hilum and left upper lobe, decreased size of spleen lesion, no evidence of disease progression Pembrolizumab every 3 weeks continued CT chest 01/12/2021-negative for acute pulmonary embolus.  Stable radiation fibrosis left upper lobe.  Stable low-density lesion in the spleen.  No new or progressive findings. 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  Splenic lesion stable.  No upper abdominal lymphadenopathy. Pembrolizumab every 3 weeks continued CT chest 08/29/2021-unchanged consolidation/fibrosis at the left upper lung, no evidence of metastatic disease in the chest, COPD, multiple compression fractures, new T11 compression fracture, stable spleen lesion by my review Pembrolizumab every 3 weeks continued Treatment held 10/13/2021 for diarrhea, C. difficile negative Pembrolizumab 11/10/2021 Pembrolizumab 12/01/2021 CTs 12/07/2021-similar treatment effect within the left upper lobe including volume loss, traction  bronchiectasis and consolidation.  No local recurrent or active metastatic disease.  Diffuse colitis most significant within the cecum.  New trace left pleural fluid. CTs 03/11/2022-stable radiation fibrosis in the upper left lung, no evidence of local tumor recurrence, no evidence of metastatic disease, normal large bowel without wall thickening CTs 07/10/2022-stable volume loss in the left hemithorax, stable small right middle lobe nodules, stable complex cystic/calcified lesion in the spleen-slightly more dense, persistent isolated biliary dilation in segment 5 colonic stool, stable thoracic compression fractures     2.  05/29/2019 laryngoscopy-exophytic appearing mass mid right vocal cord.  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  Tumor cells negative for p16, CMV and HSV 2.  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 Radiation 07/29/2019-08/25/2019   3. History of acute respiratory failure secondary to #1   4. Oxygen dependent COPD   5. History of a NSTEMI 2015   6. Right lung pneumonia on chest CT 12/12/2016-treated with Levaquin   7.  Grade 2 rash possibly related to Pembrolizumab.  Treatment held 08/06/2017.  Improved 08/29/2017, treatment resumed, rash has resolved   8.  Vertigo January 2022-improved with course of Augmentin for ear/sinus infection   9.  Diarrhea beginning early June 2023; negative C. difficile 10/13/2021; CTs 12/07/2021-diffuse colitis most significant within the cecum.  10.  Isolated segment 5 bile duct dilation    Disposition: Ms. Tarabocchia has a history of metastatic non-small cell lung cancer.  She has been maintained off of systemic therapy since July 2023.  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  The restaging CTs show no evidence of disease progression.  Discussed the CT findings with Ms. Judice.  I reviewed the CT images.  I suspect the increased dyspnea is related to  COPD.  She plans to schedule appoint with Dr. Melvyn Novas.  I encouraged her to attempt weight gain.  Her abdominal symptoms are related to lung cancer.  I recommended she follow-up with Dr. Henrene Pastor if the bowel issues persist.  I doubt the dilated segment 5 bile duct is related to malignancy.  We will consider repeat imaging in 4-6 months.  Ms. Shilts will return for an office visit in 2 months.  She will remain off of specific therapy for lung cancer.    Betsy Coder, MD  07/12/2022  3:57 PM

## 2022-07-17 DIAGNOSIS — K589 Irritable bowel syndrome without diarrhea: Secondary | ICD-10-CM | POA: Diagnosis not present

## 2022-07-17 DIAGNOSIS — W881XXS Exposure to radioactive isotopes, sequela: Secondary | ICD-10-CM | POA: Diagnosis not present

## 2022-07-17 DIAGNOSIS — E785 Hyperlipidemia, unspecified: Secondary | ICD-10-CM | POA: Diagnosis not present

## 2022-07-17 DIAGNOSIS — K219 Gastro-esophageal reflux disease without esophagitis: Secondary | ICD-10-CM | POA: Diagnosis not present

## 2022-07-17 DIAGNOSIS — J449 Chronic obstructive pulmonary disease, unspecified: Secondary | ICD-10-CM | POA: Diagnosis not present

## 2022-07-17 DIAGNOSIS — I2510 Atherosclerotic heart disease of native coronary artery without angina pectoris: Secondary | ICD-10-CM | POA: Diagnosis not present

## 2022-07-17 DIAGNOSIS — F419 Anxiety disorder, unspecified: Secondary | ICD-10-CM | POA: Diagnosis not present

## 2022-07-17 DIAGNOSIS — R531 Weakness: Secondary | ICD-10-CM | POA: Diagnosis not present

## 2022-07-17 DIAGNOSIS — C3412 Malignant neoplasm of upper lobe, left bronchus or lung: Secondary | ICD-10-CM | POA: Diagnosis not present

## 2022-07-17 DIAGNOSIS — I252 Old myocardial infarction: Secondary | ICD-10-CM | POA: Diagnosis not present

## 2022-07-17 DIAGNOSIS — Z9981 Dependence on supplemental oxygen: Secondary | ICD-10-CM | POA: Diagnosis not present

## 2022-07-17 DIAGNOSIS — I10 Essential (primary) hypertension: Secondary | ICD-10-CM | POA: Diagnosis not present

## 2022-07-17 DIAGNOSIS — C7889 Secondary malignant neoplasm of other digestive organs: Secondary | ICD-10-CM | POA: Diagnosis not present

## 2022-07-17 DIAGNOSIS — Z7951 Long term (current) use of inhaled steroids: Secondary | ICD-10-CM | POA: Diagnosis not present

## 2022-07-17 DIAGNOSIS — J701 Chronic and other pulmonary manifestations due to radiation: Secondary | ICD-10-CM | POA: Diagnosis not present

## 2022-07-17 DIAGNOSIS — C787 Secondary malignant neoplasm of liver and intrahepatic bile duct: Secondary | ICD-10-CM | POA: Diagnosis not present

## 2022-07-17 DIAGNOSIS — J9610 Chronic respiratory failure, unspecified whether with hypoxia or hypercapnia: Secondary | ICD-10-CM | POA: Diagnosis not present

## 2022-07-18 ENCOUNTER — Other Ambulatory Visit (HOSPITAL_COMMUNITY): Payer: Self-pay

## 2022-07-24 DIAGNOSIS — K589 Irritable bowel syndrome without diarrhea: Secondary | ICD-10-CM | POA: Diagnosis not present

## 2022-07-24 DIAGNOSIS — Z9981 Dependence on supplemental oxygen: Secondary | ICD-10-CM | POA: Diagnosis not present

## 2022-07-24 DIAGNOSIS — W881XXS Exposure to radioactive isotopes, sequela: Secondary | ICD-10-CM | POA: Diagnosis not present

## 2022-07-24 DIAGNOSIS — C7889 Secondary malignant neoplasm of other digestive organs: Secondary | ICD-10-CM | POA: Diagnosis not present

## 2022-07-24 DIAGNOSIS — R531 Weakness: Secondary | ICD-10-CM | POA: Diagnosis not present

## 2022-07-24 DIAGNOSIS — K219 Gastro-esophageal reflux disease without esophagitis: Secondary | ICD-10-CM | POA: Diagnosis not present

## 2022-07-24 DIAGNOSIS — I10 Essential (primary) hypertension: Secondary | ICD-10-CM | POA: Diagnosis not present

## 2022-07-24 DIAGNOSIS — C3412 Malignant neoplasm of upper lobe, left bronchus or lung: Secondary | ICD-10-CM | POA: Diagnosis not present

## 2022-07-24 DIAGNOSIS — E785 Hyperlipidemia, unspecified: Secondary | ICD-10-CM | POA: Diagnosis not present

## 2022-07-24 DIAGNOSIS — C787 Secondary malignant neoplasm of liver and intrahepatic bile duct: Secondary | ICD-10-CM | POA: Diagnosis not present

## 2022-07-24 DIAGNOSIS — Z7951 Long term (current) use of inhaled steroids: Secondary | ICD-10-CM | POA: Diagnosis not present

## 2022-07-24 DIAGNOSIS — J449 Chronic obstructive pulmonary disease, unspecified: Secondary | ICD-10-CM | POA: Diagnosis not present

## 2022-07-24 DIAGNOSIS — I252 Old myocardial infarction: Secondary | ICD-10-CM | POA: Diagnosis not present

## 2022-07-24 DIAGNOSIS — J9610 Chronic respiratory failure, unspecified whether with hypoxia or hypercapnia: Secondary | ICD-10-CM | POA: Diagnosis not present

## 2022-07-24 DIAGNOSIS — I2510 Atherosclerotic heart disease of native coronary artery without angina pectoris: Secondary | ICD-10-CM | POA: Diagnosis not present

## 2022-07-24 DIAGNOSIS — J701 Chronic and other pulmonary manifestations due to radiation: Secondary | ICD-10-CM | POA: Diagnosis not present

## 2022-07-24 DIAGNOSIS — F419 Anxiety disorder, unspecified: Secondary | ICD-10-CM | POA: Diagnosis not present

## 2022-07-25 ENCOUNTER — Telehealth (HOSPITAL_BASED_OUTPATIENT_CLINIC_OR_DEPARTMENT_OTHER): Payer: Self-pay | Admitting: *Deleted

## 2022-07-25 NOTE — Telephone Encounter (Signed)
Spoke with patient to verify Atorvastatin dose Patient is concerned that the Atorvastatin is causing her not to gain weight  She has had cancer, better now  Is eating often and is still only at 79 pounds  Dr Benay Spice does not think related to cancer, is following up with GI Is concerned related to Atorvastatin  Advised would forward to Pharm D for review

## 2022-07-26 NOTE — Telephone Encounter (Signed)
July 26, 2022 Brooke [name], [facility]  to [facility]     07/26/22  9:56 AM Atorvastatin does not have any effect on appetite or weight. Product monograph does not indicate that this drug has any s/e of that nature.  Advised patient, verbalized understanding

## 2022-08-01 DIAGNOSIS — R531 Weakness: Secondary | ICD-10-CM | POA: Diagnosis not present

## 2022-08-01 DIAGNOSIS — C3412 Malignant neoplasm of upper lobe, left bronchus or lung: Secondary | ICD-10-CM | POA: Diagnosis not present

## 2022-08-01 DIAGNOSIS — I2510 Atherosclerotic heart disease of native coronary artery without angina pectoris: Secondary | ICD-10-CM | POA: Diagnosis not present

## 2022-08-01 DIAGNOSIS — Z9981 Dependence on supplemental oxygen: Secondary | ICD-10-CM | POA: Diagnosis not present

## 2022-08-01 DIAGNOSIS — I10 Essential (primary) hypertension: Secondary | ICD-10-CM | POA: Diagnosis not present

## 2022-08-01 DIAGNOSIS — W881XXS Exposure to radioactive isotopes, sequela: Secondary | ICD-10-CM | POA: Diagnosis not present

## 2022-08-01 DIAGNOSIS — J449 Chronic obstructive pulmonary disease, unspecified: Secondary | ICD-10-CM | POA: Diagnosis not present

## 2022-08-01 DIAGNOSIS — Z7951 Long term (current) use of inhaled steroids: Secondary | ICD-10-CM | POA: Diagnosis not present

## 2022-08-01 DIAGNOSIS — F419 Anxiety disorder, unspecified: Secondary | ICD-10-CM | POA: Diagnosis not present

## 2022-08-01 DIAGNOSIS — C7889 Secondary malignant neoplasm of other digestive organs: Secondary | ICD-10-CM | POA: Diagnosis not present

## 2022-08-01 DIAGNOSIS — I252 Old myocardial infarction: Secondary | ICD-10-CM | POA: Diagnosis not present

## 2022-08-01 DIAGNOSIS — J9610 Chronic respiratory failure, unspecified whether with hypoxia or hypercapnia: Secondary | ICD-10-CM | POA: Diagnosis not present

## 2022-08-01 DIAGNOSIS — J701 Chronic and other pulmonary manifestations due to radiation: Secondary | ICD-10-CM | POA: Diagnosis not present

## 2022-08-01 DIAGNOSIS — E785 Hyperlipidemia, unspecified: Secondary | ICD-10-CM | POA: Diagnosis not present

## 2022-08-01 DIAGNOSIS — K219 Gastro-esophageal reflux disease without esophagitis: Secondary | ICD-10-CM | POA: Diagnosis not present

## 2022-08-01 DIAGNOSIS — C787 Secondary malignant neoplasm of liver and intrahepatic bile duct: Secondary | ICD-10-CM | POA: Diagnosis not present

## 2022-08-01 DIAGNOSIS — K589 Irritable bowel syndrome without diarrhea: Secondary | ICD-10-CM | POA: Diagnosis not present

## 2022-08-04 DIAGNOSIS — J449 Chronic obstructive pulmonary disease, unspecified: Secondary | ICD-10-CM | POA: Diagnosis not present

## 2022-08-04 DIAGNOSIS — K589 Irritable bowel syndrome without diarrhea: Secondary | ICD-10-CM | POA: Diagnosis not present

## 2022-08-04 DIAGNOSIS — C3412 Malignant neoplasm of upper lobe, left bronchus or lung: Secondary | ICD-10-CM | POA: Diagnosis not present

## 2022-08-04 DIAGNOSIS — I2510 Atherosclerotic heart disease of native coronary artery without angina pectoris: Secondary | ICD-10-CM | POA: Diagnosis not present

## 2022-08-04 DIAGNOSIS — F419 Anxiety disorder, unspecified: Secondary | ICD-10-CM | POA: Diagnosis not present

## 2022-08-04 DIAGNOSIS — J701 Chronic and other pulmonary manifestations due to radiation: Secondary | ICD-10-CM | POA: Diagnosis not present

## 2022-08-04 DIAGNOSIS — K219 Gastro-esophageal reflux disease without esophagitis: Secondary | ICD-10-CM | POA: Diagnosis not present

## 2022-08-04 DIAGNOSIS — I10 Essential (primary) hypertension: Secondary | ICD-10-CM | POA: Diagnosis not present

## 2022-08-04 DIAGNOSIS — E785 Hyperlipidemia, unspecified: Secondary | ICD-10-CM | POA: Diagnosis not present

## 2022-08-04 DIAGNOSIS — Z9981 Dependence on supplemental oxygen: Secondary | ICD-10-CM | POA: Diagnosis not present

## 2022-08-04 DIAGNOSIS — J9610 Chronic respiratory failure, unspecified whether with hypoxia or hypercapnia: Secondary | ICD-10-CM | POA: Diagnosis not present

## 2022-08-04 DIAGNOSIS — I252 Old myocardial infarction: Secondary | ICD-10-CM | POA: Diagnosis not present

## 2022-08-04 DIAGNOSIS — C7889 Secondary malignant neoplasm of other digestive organs: Secondary | ICD-10-CM | POA: Diagnosis not present

## 2022-08-04 DIAGNOSIS — C787 Secondary malignant neoplasm of liver and intrahepatic bile duct: Secondary | ICD-10-CM | POA: Diagnosis not present

## 2022-08-09 DIAGNOSIS — J701 Chronic and other pulmonary manifestations due to radiation: Secondary | ICD-10-CM | POA: Diagnosis not present

## 2022-08-09 DIAGNOSIS — C3412 Malignant neoplasm of upper lobe, left bronchus or lung: Secondary | ICD-10-CM | POA: Diagnosis not present

## 2022-08-09 DIAGNOSIS — I10 Essential (primary) hypertension: Secondary | ICD-10-CM | POA: Diagnosis not present

## 2022-08-09 DIAGNOSIS — I2510 Atherosclerotic heart disease of native coronary artery without angina pectoris: Secondary | ICD-10-CM | POA: Diagnosis not present

## 2022-08-09 DIAGNOSIS — C7889 Secondary malignant neoplasm of other digestive organs: Secondary | ICD-10-CM | POA: Diagnosis not present

## 2022-08-09 DIAGNOSIS — C787 Secondary malignant neoplasm of liver and intrahepatic bile duct: Secondary | ICD-10-CM | POA: Diagnosis not present

## 2022-08-09 DIAGNOSIS — J9610 Chronic respiratory failure, unspecified whether with hypoxia or hypercapnia: Secondary | ICD-10-CM | POA: Diagnosis not present

## 2022-08-09 DIAGNOSIS — Z9981 Dependence on supplemental oxygen: Secondary | ICD-10-CM | POA: Diagnosis not present

## 2022-08-09 DIAGNOSIS — E785 Hyperlipidemia, unspecified: Secondary | ICD-10-CM | POA: Diagnosis not present

## 2022-08-09 DIAGNOSIS — J449 Chronic obstructive pulmonary disease, unspecified: Secondary | ICD-10-CM | POA: Diagnosis not present

## 2022-08-09 DIAGNOSIS — K219 Gastro-esophageal reflux disease without esophagitis: Secondary | ICD-10-CM | POA: Diagnosis not present

## 2022-08-09 DIAGNOSIS — I252 Old myocardial infarction: Secondary | ICD-10-CM | POA: Diagnosis not present

## 2022-08-09 DIAGNOSIS — F419 Anxiety disorder, unspecified: Secondary | ICD-10-CM | POA: Diagnosis not present

## 2022-08-09 DIAGNOSIS — K589 Irritable bowel syndrome without diarrhea: Secondary | ICD-10-CM | POA: Diagnosis not present

## 2022-08-12 DIAGNOSIS — J449 Chronic obstructive pulmonary disease, unspecified: Secondary | ICD-10-CM | POA: Diagnosis not present

## 2022-08-16 DIAGNOSIS — K219 Gastro-esophageal reflux disease without esophagitis: Secondary | ICD-10-CM | POA: Diagnosis not present

## 2022-08-16 DIAGNOSIS — J701 Chronic and other pulmonary manifestations due to radiation: Secondary | ICD-10-CM | POA: Diagnosis not present

## 2022-08-16 DIAGNOSIS — C787 Secondary malignant neoplasm of liver and intrahepatic bile duct: Secondary | ICD-10-CM | POA: Diagnosis not present

## 2022-08-16 DIAGNOSIS — I2510 Atherosclerotic heart disease of native coronary artery without angina pectoris: Secondary | ICD-10-CM | POA: Diagnosis not present

## 2022-08-16 DIAGNOSIS — J449 Chronic obstructive pulmonary disease, unspecified: Secondary | ICD-10-CM | POA: Diagnosis not present

## 2022-08-16 DIAGNOSIS — Z9981 Dependence on supplemental oxygen: Secondary | ICD-10-CM | POA: Diagnosis not present

## 2022-08-16 DIAGNOSIS — K589 Irritable bowel syndrome without diarrhea: Secondary | ICD-10-CM | POA: Diagnosis not present

## 2022-08-16 DIAGNOSIS — I252 Old myocardial infarction: Secondary | ICD-10-CM | POA: Diagnosis not present

## 2022-08-16 DIAGNOSIS — J9610 Chronic respiratory failure, unspecified whether with hypoxia or hypercapnia: Secondary | ICD-10-CM | POA: Diagnosis not present

## 2022-08-16 DIAGNOSIS — C3412 Malignant neoplasm of upper lobe, left bronchus or lung: Secondary | ICD-10-CM | POA: Diagnosis not present

## 2022-08-16 DIAGNOSIS — I10 Essential (primary) hypertension: Secondary | ICD-10-CM | POA: Diagnosis not present

## 2022-08-16 DIAGNOSIS — E785 Hyperlipidemia, unspecified: Secondary | ICD-10-CM | POA: Diagnosis not present

## 2022-08-16 DIAGNOSIS — F419 Anxiety disorder, unspecified: Secondary | ICD-10-CM | POA: Diagnosis not present

## 2022-08-16 DIAGNOSIS — C7889 Secondary malignant neoplasm of other digestive organs: Secondary | ICD-10-CM | POA: Diagnosis not present

## 2022-08-21 ENCOUNTER — Encounter: Payer: Self-pay | Admitting: Nurse Practitioner

## 2022-08-21 ENCOUNTER — Ambulatory Visit (INDEPENDENT_AMBULATORY_CARE_PROVIDER_SITE_OTHER): Payer: Medicare Other | Admitting: Nurse Practitioner

## 2022-08-21 VITALS — BP 120/60 | HR 85 | Ht 62.0 in | Wt 79.6 lb

## 2022-08-21 DIAGNOSIS — R194 Change in bowel habit: Secondary | ICD-10-CM

## 2022-08-21 NOTE — Progress Notes (Signed)
Reviewed. I think her progressive weight loss is due to multiple advancing comorbidities and diseases. Bowel habits are nonspecific.  She may wish to try different bulking agent such as Citrucel. You might try an antispasmodic such as sublingual Levsin or Bentyl for left lower quadrant discomfort. Agree, that she is not appropriate for colonoscopy given her comorbidities and age. Thanks, Dr. Marina Goodell

## 2022-08-21 NOTE — Progress Notes (Signed)
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 She was last seen by Dr. Marina Goodell in office 02/14/2022.  At that time, her diarrhea resolved and she was passing 1-2 formed stools with the smaller diameter with persistent vague left lower abdominal discomfort. She presents today with similar symptoms. She denies passing any watery or loose stools. She describes passing 3 to 4 finger or worm like long stools several times daily. She stopped taking Metamucil 02/2022 because she felt this fiber supplement was contributing to her irregular bowel pattern.  Takes a probiotic daily.  No rectal bleeding or black stools. She is not on chemotherapy and immunotherapy was previously discontinued. She denies ever having colonoscopy. She had a Cologuard test in the past which she reported was normal. She continue to had chronic low level LLQ pain. CTAP with contrast 07/10/2022 showed a moderate amount of colonic stool and a few loops of the colon were distended but unchanged from prior image study.  No bowel obstruction was identified.  Stable mild right middle lung lobe nodules and stable splenic cystic/calcified lesions were noted.  She is also concerned that she continues to lose weight. She weighed 123 pounds 06/2018, 106 pounds 07/2020, 89 pounds 06/2021 and today's weight is down to 79 pounds.  She has intermittent nausea which she sometimes associates with having vertigo.  No vomiting.  She has infrequent heartburn.  She is not taking Pantoprazole as previously prescribed.  No dysphagia.  She feels  like she has a good appetite but grazes throughout the day and does not eat meals consistently.  She remains on continuous oxygen 1.5 L.  She lives by herself.     Latest Ref Rng & Units 03/16/2022   10:46 AM 02/23/2022    9:07 AM 01/26/2022    9:46 AM  CBC  WBC 4.0 - 10.5 K/uL 4.1  4.7  4.5   Hemoglobin 12.0 - 15.0 g/dL 40.9  81.1  91.4   Hematocrit 36.0 - 46.0 % 40.9  39.9  38.4   Platelets 150 - 400 K/uL 202  195  195        Latest Ref Rng & Units 07/10/2022    9:16 AM 05/18/2022    8:56 AM 05/18/2022   12:00 AM  CMP  Glucose 70 - 99 mg/dL 92  98  98   BUN 8 - 23 mg/dL Creatinine 0.44 - 1.00 mg/dL 7.82  9.56  2.13   Sodium 135 - 145 mmol/L 137  139  139   Potassium 3.5 - 5.1 mmol/L 3.9  3.3  3.7   Chloride 98 - 111 mmol/L 98  98  98   CO2 22 - 32 mmol/L 33  32  23   Calcium 8.9 - 10.3 mg/dL 9.6  9.5  9.1   Total Protein 6.5 - 8.1 g/dL 7.0  6.8  6.6   Total Bilirubin 0.3 - 1.2 mg/dL 0.5  0.5  0.3   Alkaline Phos 38 - 126 U/L 53  51  61   AST 15 - 41 U/L 21  21  24   ALT 0 - 44 U/L 25  23  25       Chest/Abdominal/Pelvic CT with contrast 07/10/2022:  FINDINGS: CT CHEST FINDINGS   Cardiovascular: Heart is nonenlarged. Small pericardial effusion. Coronary artery calcifications are seen. Thoracic aorta has a normal course and caliber with scattered vascular calcifications.   Mediastinum/Nodes: Thyroid gland is unremarkable. Slightly patulous thoracic esophagus. No specific abnormal lymph node enlargement present in the axillary regions, hilum or mediastinum.   Lungs/Pleura: Right lung has hyperinflation with centrilobular emphysematous changes, greatest in the upper lung zones. Right lung is without consolidation, pneumothorax or effusion. There is a small nodule along the medial aspect of the middle lobe. Previously this measured 4 mm and today unchanged on series 4, image 68. Tiny focus just lateral to this is also stable measuring 2 mm.   There is volume  loss along the left hemithorax with well-defined opacity along the left lung apex with distorted bronchi which are enlarged and pleural thickening. Retraction superiorly of the hilum this volume loss. There is some loculated fluid as well. Appearance is unchanged from previous. No new soft tissue mass. Few punctate calcifications as well.   Musculoskeletal: Scattered degenerative changes along the spine. There are compression deformities along the spine T5, T8 and T11, unchanged. Minimal compression of the superior endplate of T3 as well stable.   CT ABDOMEN PELVIS FINDINGS   Hepatobiliary: Stable hepatic benign cystic lesions. There is an area of isolated biliary ductal dilatation involving segment 5, unchanged from previous. Gallbladder is contracted. No new space-occupying liver lesion. Patent portal vein.   Pancreas: Unremarkable. No pancreatic ductal dilatation or surrounding inflammatory changes.   Spleen: Stable complex cystic and calcified lesion involving the spleen in terms of size. The area appears more dense today. This measures 20 mm in maximal dimension the current examination.   Adrenals/Urinary Tract: Adrenal glands are preserved. No enhancing renal mass or collecting system dilatation. No collecting system dilatation. Preserved contours of the urinary bladder.   Stomach/Bowel: Moderate colonic stool. Few loops of colon are distended but unchanged from previous. Otherwise the bowel is nondilated. No obstruction. Stomach is underdistended. No free air or free fluid.   Vascular/Lymphatic: Diffuse vascular calcifications. Normal caliber aorta and IVC.   Reproductive: Uterus and bilateral adnexa are unremarkable.   Other: Anasarca.   Musculoskeletal: Schmorl's node changes at the L3 vertebral level. Scattered degenerative changes of the spine and pelvis.   IMPRESSION: 1. Stable volume loss along the left hemithorax with apical opacity, loculated fluid and  bronchiectasis. There is retraction of the hilum. 2. Stable small right middle lobe nodules. Simple attention on follow-up. 3. Stable complex cystic and calcified lesion involving the spleen. The area appears slightly more dense today. 4. Persistent isolated biliary ductal dilatation in segment 5. Recommend either short-term follow-up in 3-6 months or additional workup with dynamic MRI. 5. Significant colonic stool. 6. Stable mild areas compression along the thoracic spine.   Aortic Atherosclerosis and Emphysema    Current Outpatient Medications on File Prior to Visit  Medication Sig Dispense Refill   acetaminophen (TYLENOL) 325 MG tablet Per bottle as needed     albuterol (VENTOLIN HFA) 108 (90 Base) MCG/ACT inhaler Inhale 2 puffs into the lungs every 4 (four) hours as needed.     ALPRAZolam (XANAX) 0.25 MG tablet Take 0.5-1 tablets by mouth 2 (two) times daily as needed.     amLODipine (NORVASC) 2.5 MG tablet TAKE 1 TABLET IF BLOOD PRESSURE(140-159), 2 TABLETS(160-179), 3  TABLETS FOR BLOOD PRESSURE GREATER THAN 180 180 tablet 3   atorvastatin (LIPITOR) 20 MG tablet TAKE 4 TABLETS(80 MG) BY MOUTH DAILY 360 tablet 1   budesonide-formoterol (SYMBICORT) 160-4.5 MCG/ACT inhaler Inhale 2 puffs into the lungs 2 (two) times daily.     docusate sodium (COLACE) 100 MG capsule Take 100 mg by mouth 2 (two) times daily.     guaiFENesin (MUCINEX) 600 MG 12 hr tablet Take 600 mg by mouth 2 (two) times daily as needed for cough or to loosen phlegm.     ipratropium-albuterol (DUONEB) 0.5-2.5 (3) MG/3ML SOLN Take 3 mLs by nebulization every 4 (four) hours as needed (wheezing/shortness of breath). **PLAN C**     OXYGEN 2lpm 24/7     pantoprazole (PROTONIX) 40 MG tablet Take 1 tablet (40 mg total) by mouth daily. Take 30-60 min before first meal of the day 30 tablet 2   potassium chloride (KLOR-CON M) 10 MEQ tablet TAKE 1 TABLET(10 MEQ) BY MOUTH DAILY 30 tablet 1   Probiotic Product (PROBIOTIC ADVANCED  PO) Take 1 tablet by mouth daily.     Simethicone 180 MG CAPS Use as needed     OVER THE COUNTER MEDICATION Aspercream as needed (Patient not taking: Reported on 08/21/2022)     No current facility-administered medications on file prior to visit.   Allergies  Allergen Reactions   Afrin [Oxymetazoline] Anxiety   Pulmicort [Budesonide] Other (See Comments)    "feeling of torture"   Sulfa Antibiotics Other (See Comments)    Hallucinations, decreased appetite   Nitrofuran Derivatives    Codeine Nausea And Vomiting   Imdur [Isosorbide Dinitrate] Other (See Comments)    headache   Prednisone Other (See Comments)    Reaction:Abnormal behavior; cannot take in pill form but CAN tolerate the injection    Current Medications, Allergies, Past Medical History, Past Surgical History, Family History and Social History were reviewed in [employer] record.   Review of Systems:   Constitutional: Negative for fever, sweats, chills or weight loss.  Respiratory: Negative for shortness of breath.   Cardiovascular: Negative for chest pain, palpitations and leg swelling.  Gastrointestinal: See HPI.  Musculoskeletal: Negative for back pain or muscle aches.  Neurological: Negative for dizziness, headaches or paresthesias.    Physical Exam: BP 120/60   Pulse 85   Ht 5\' 2"  (1.575 m)   Wt 79 lb 9.6 oz (36.1 kg)   SpO2 98%   BMI 14.56 kg/m   Wt Readings from Last 3 Encounters:  08/21/22 79 lb 9.6 oz (36.1 kg)  07/12/22 78 lb 3.2 oz (35.5 kg)  05/18/22 80 lb 12.8 oz (36.7 kg)    General: 77 year old female cachectic appearing on 1.5 L nasal cannula in no acute distress. Head: Normocephalic and atraumatic. Eyes: No scleral icterus. Conjunctiva pink . Ears: Normal auditory acuity. Mouth: Dentition intact. No ulcers or lesions.  Lungs: Clear throughout to auscultation. Heart: Regular rate and rhythm, no murmur. Abdomen: Soft, nontender and nondistended. No masses or  hepatomegaly. Normal bowel sounds x 4 quadrants.  Rectal: Deferred. Musculoskeletal: Symmetrical with no gross deformities. Extremities: No edema. Neurological: Alert oriented x 4. No focal deficits.  Psychological: Alert and cooperative. Normal mood and affect  Assessment and Recommendations:  Nausea without vomiting possible chronic gastritis. CTAP showed a slightly patulous thoracic esophagus and an under distended stomach. -Famotidine 20 mg 1 p.o. nightly -Consider diagnostic endoscopy if symptoms persist/worsen and if weight loss continues  Chronic LLQ pain and altered  bowel function, passing multiple narrow/long stools daily in setting of inconsistent dietary pattern, grazes throughout the day. CTAP 07/10/2022 showed colonic stool burden. 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  Normal LFTs 05/2022. -Consider abdominal MRI/MRCP

## 2022-08-21 NOTE — Patient Instructions (Addendum)
Continue Famotidine at bedtime for 2 weeks.  Benefiber- 1 tablespoon daily as tolerated  Ensure or Boost- 1 to 2 cans daily  Increase dietary protein intake.  Ibgard one po twice daily as needed for abdominal pain   Thank you for trusting me with your gastrointestinal care!   Alcide Evener, CRNP

## 2022-08-21 NOTE — Progress Notes (Signed)
DD, pls contact patient and let her know Dr. Marina Goodell did not assess she needed a course of xifaxan at this time but did recommend fiber. I recommended Benefiber or she can try Citrucel, whichever she prefers. If IbGard does not improve her pain, patient to contact me and I will then prescribe low dose Bentyl as recommended by Dr. Marina Goodell. THX

## 2022-08-24 DIAGNOSIS — K219 Gastro-esophageal reflux disease without esophagitis: Secondary | ICD-10-CM | POA: Diagnosis not present

## 2022-08-24 DIAGNOSIS — J701 Chronic and other pulmonary manifestations due to radiation: Secondary | ICD-10-CM | POA: Diagnosis not present

## 2022-08-24 DIAGNOSIS — F419 Anxiety disorder, unspecified: Secondary | ICD-10-CM | POA: Diagnosis not present

## 2022-08-24 DIAGNOSIS — C787 Secondary malignant neoplasm of liver and intrahepatic bile duct: Secondary | ICD-10-CM | POA: Diagnosis not present

## 2022-08-24 DIAGNOSIS — J449 Chronic obstructive pulmonary disease, unspecified: Secondary | ICD-10-CM | POA: Diagnosis not present

## 2022-08-24 DIAGNOSIS — I2510 Atherosclerotic heart disease of native coronary artery without angina pectoris: Secondary | ICD-10-CM | POA: Diagnosis not present

## 2022-08-24 DIAGNOSIS — Z9981 Dependence on supplemental oxygen: Secondary | ICD-10-CM | POA: Diagnosis not present

## 2022-08-24 DIAGNOSIS — I252 Old myocardial infarction: Secondary | ICD-10-CM | POA: Diagnosis not present

## 2022-08-24 DIAGNOSIS — K589 Irritable bowel syndrome without diarrhea: Secondary | ICD-10-CM | POA: Diagnosis not present

## 2022-08-24 DIAGNOSIS — C3412 Malignant neoplasm of upper lobe, left bronchus or lung: Secondary | ICD-10-CM | POA: Diagnosis not present

## 2022-08-24 DIAGNOSIS — E785 Hyperlipidemia, unspecified: Secondary | ICD-10-CM | POA: Diagnosis not present

## 2022-08-24 DIAGNOSIS — C7889 Secondary malignant neoplasm of other digestive organs: Secondary | ICD-10-CM | POA: Diagnosis not present

## 2022-08-24 DIAGNOSIS — I10 Essential (primary) hypertension: Secondary | ICD-10-CM | POA: Diagnosis not present

## 2022-08-24 DIAGNOSIS — J9610 Chronic respiratory failure, unspecified whether with hypoxia or hypercapnia: Secondary | ICD-10-CM | POA: Diagnosis not present

## 2022-08-29 DIAGNOSIS — I2510 Atherosclerotic heart disease of native coronary artery without angina pectoris: Secondary | ICD-10-CM | POA: Diagnosis not present

## 2022-08-29 DIAGNOSIS — K589 Irritable bowel syndrome without diarrhea: Secondary | ICD-10-CM | POA: Diagnosis not present

## 2022-08-29 DIAGNOSIS — Z9981 Dependence on supplemental oxygen: Secondary | ICD-10-CM | POA: Diagnosis not present

## 2022-08-29 DIAGNOSIS — I252 Old myocardial infarction: Secondary | ICD-10-CM | POA: Diagnosis not present

## 2022-08-29 DIAGNOSIS — C787 Secondary malignant neoplasm of liver and intrahepatic bile duct: Secondary | ICD-10-CM | POA: Diagnosis not present

## 2022-08-29 DIAGNOSIS — C3412 Malignant neoplasm of upper lobe, left bronchus or lung: Secondary | ICD-10-CM | POA: Diagnosis not present

## 2022-08-29 DIAGNOSIS — J9610 Chronic respiratory failure, unspecified whether with hypoxia or hypercapnia: Secondary | ICD-10-CM | POA: Diagnosis not present

## 2022-08-29 DIAGNOSIS — E785 Hyperlipidemia, unspecified: Secondary | ICD-10-CM | POA: Diagnosis not present

## 2022-08-29 DIAGNOSIS — K219 Gastro-esophageal reflux disease without esophagitis: Secondary | ICD-10-CM | POA: Diagnosis not present

## 2022-08-29 DIAGNOSIS — F419 Anxiety disorder, unspecified: Secondary | ICD-10-CM | POA: Diagnosis not present

## 2022-08-29 DIAGNOSIS — J701 Chronic and other pulmonary manifestations due to radiation: Secondary | ICD-10-CM | POA: Diagnosis not present

## 2022-08-29 DIAGNOSIS — C7889 Secondary malignant neoplasm of other digestive organs: Secondary | ICD-10-CM | POA: Diagnosis not present

## 2022-08-29 DIAGNOSIS — I10 Essential (primary) hypertension: Secondary | ICD-10-CM | POA: Diagnosis not present

## 2022-08-29 DIAGNOSIS — J449 Chronic obstructive pulmonary disease, unspecified: Secondary | ICD-10-CM | POA: Diagnosis not present

## 2022-09-03 DIAGNOSIS — J701 Chronic and other pulmonary manifestations due to radiation: Secondary | ICD-10-CM | POA: Diagnosis not present

## 2022-09-03 DIAGNOSIS — Z9981 Dependence on supplemental oxygen: Secondary | ICD-10-CM | POA: Diagnosis not present

## 2022-09-03 DIAGNOSIS — J449 Chronic obstructive pulmonary disease, unspecified: Secondary | ICD-10-CM | POA: Diagnosis not present

## 2022-09-03 DIAGNOSIS — K589 Irritable bowel syndrome without diarrhea: Secondary | ICD-10-CM | POA: Diagnosis not present

## 2022-09-03 DIAGNOSIS — I2510 Atherosclerotic heart disease of native coronary artery without angina pectoris: Secondary | ICD-10-CM | POA: Diagnosis not present

## 2022-09-03 DIAGNOSIS — C7889 Secondary malignant neoplasm of other digestive organs: Secondary | ICD-10-CM | POA: Diagnosis not present

## 2022-09-03 DIAGNOSIS — E785 Hyperlipidemia, unspecified: Secondary | ICD-10-CM | POA: Diagnosis not present

## 2022-09-03 DIAGNOSIS — F419 Anxiety disorder, unspecified: Secondary | ICD-10-CM | POA: Diagnosis not present

## 2022-09-03 DIAGNOSIS — C787 Secondary malignant neoplasm of liver and intrahepatic bile duct: Secondary | ICD-10-CM | POA: Diagnosis not present

## 2022-09-03 DIAGNOSIS — J9610 Chronic respiratory failure, unspecified whether with hypoxia or hypercapnia: Secondary | ICD-10-CM | POA: Diagnosis not present

## 2022-09-03 DIAGNOSIS — K219 Gastro-esophageal reflux disease without esophagitis: Secondary | ICD-10-CM | POA: Diagnosis not present

## 2022-09-03 DIAGNOSIS — I10 Essential (primary) hypertension: Secondary | ICD-10-CM | POA: Diagnosis not present

## 2022-09-03 DIAGNOSIS — I252 Old myocardial infarction: Secondary | ICD-10-CM | POA: Diagnosis not present

## 2022-09-03 DIAGNOSIS — C3412 Malignant neoplasm of upper lobe, left bronchus or lung: Secondary | ICD-10-CM | POA: Diagnosis not present

## 2022-09-05 DIAGNOSIS — J701 Chronic and other pulmonary manifestations due to radiation: Secondary | ICD-10-CM | POA: Diagnosis not present

## 2022-09-05 DIAGNOSIS — C3412 Malignant neoplasm of upper lobe, left bronchus or lung: Secondary | ICD-10-CM | POA: Diagnosis not present

## 2022-09-05 DIAGNOSIS — I10 Essential (primary) hypertension: Secondary | ICD-10-CM | POA: Diagnosis not present

## 2022-09-05 DIAGNOSIS — C7889 Secondary malignant neoplasm of other digestive organs: Secondary | ICD-10-CM | POA: Diagnosis not present

## 2022-09-05 DIAGNOSIS — C787 Secondary malignant neoplasm of liver and intrahepatic bile duct: Secondary | ICD-10-CM | POA: Diagnosis not present

## 2022-09-05 DIAGNOSIS — K219 Gastro-esophageal reflux disease without esophagitis: Secondary | ICD-10-CM | POA: Diagnosis not present

## 2022-09-05 DIAGNOSIS — Z9981 Dependence on supplemental oxygen: Secondary | ICD-10-CM | POA: Diagnosis not present

## 2022-09-05 DIAGNOSIS — I252 Old myocardial infarction: Secondary | ICD-10-CM | POA: Diagnosis not present

## 2022-09-05 DIAGNOSIS — J9610 Chronic respiratory failure, unspecified whether with hypoxia or hypercapnia: Secondary | ICD-10-CM | POA: Diagnosis not present

## 2022-09-05 DIAGNOSIS — F419 Anxiety disorder, unspecified: Secondary | ICD-10-CM | POA: Diagnosis not present

## 2022-09-05 DIAGNOSIS — I2510 Atherosclerotic heart disease of native coronary artery without angina pectoris: Secondary | ICD-10-CM | POA: Diagnosis not present

## 2022-09-05 DIAGNOSIS — E785 Hyperlipidemia, unspecified: Secondary | ICD-10-CM | POA: Diagnosis not present

## 2022-09-05 DIAGNOSIS — K589 Irritable bowel syndrome without diarrhea: Secondary | ICD-10-CM | POA: Diagnosis not present

## 2022-09-05 DIAGNOSIS — J449 Chronic obstructive pulmonary disease, unspecified: Secondary | ICD-10-CM | POA: Diagnosis not present

## 2022-09-11 DIAGNOSIS — J449 Chronic obstructive pulmonary disease, unspecified: Secondary | ICD-10-CM | POA: Diagnosis not present

## 2022-09-14 ENCOUNTER — Inpatient Hospital Stay: Payer: Medicare Other | Attending: Oncology | Admitting: Oncology

## 2022-09-14 VITALS — BP 152/89 | HR 79 | Temp 98.1°F | Resp 19 | Ht 62.0 in | Wt 81.8 lb

## 2022-09-14 DIAGNOSIS — I2510 Atherosclerotic heart disease of native coronary artery without angina pectoris: Secondary | ICD-10-CM | POA: Diagnosis not present

## 2022-09-14 DIAGNOSIS — Z86711 Personal history of pulmonary embolism: Secondary | ICD-10-CM | POA: Insufficient documentation

## 2022-09-14 DIAGNOSIS — I252 Old myocardial infarction: Secondary | ICD-10-CM | POA: Diagnosis not present

## 2022-09-14 DIAGNOSIS — Z79899 Other long term (current) drug therapy: Secondary | ICD-10-CM | POA: Diagnosis not present

## 2022-09-14 DIAGNOSIS — K529 Noninfective gastroenteritis and colitis, unspecified: Secondary | ICD-10-CM | POA: Insufficient documentation

## 2022-09-14 DIAGNOSIS — J449 Chronic obstructive pulmonary disease, unspecified: Secondary | ICD-10-CM | POA: Diagnosis not present

## 2022-09-14 DIAGNOSIS — Z9981 Dependence on supplemental oxygen: Secondary | ICD-10-CM | POA: Insufficient documentation

## 2022-09-14 DIAGNOSIS — J9610 Chronic respiratory failure, unspecified whether with hypoxia or hypercapnia: Secondary | ICD-10-CM | POA: Diagnosis not present

## 2022-09-14 DIAGNOSIS — K219 Gastro-esophageal reflux disease without esophagitis: Secondary | ICD-10-CM | POA: Diagnosis not present

## 2022-09-14 DIAGNOSIS — J701 Chronic and other pulmonary manifestations due to radiation: Secondary | ICD-10-CM | POA: Diagnosis not present

## 2022-09-14 DIAGNOSIS — K5900 Constipation, unspecified: Secondary | ICD-10-CM | POA: Insufficient documentation

## 2022-09-14 DIAGNOSIS — M4854XA Collapsed vertebra, not elsewhere classified, thoracic region, initial encounter for fracture: Secondary | ICD-10-CM | POA: Insufficient documentation

## 2022-09-14 DIAGNOSIS — E785 Hyperlipidemia, unspecified: Secondary | ICD-10-CM | POA: Diagnosis not present

## 2022-09-14 DIAGNOSIS — C787 Secondary malignant neoplasm of liver and intrahepatic bile duct: Secondary | ICD-10-CM | POA: Diagnosis not present

## 2022-09-14 DIAGNOSIS — C7889 Secondary malignant neoplasm of other digestive organs: Secondary | ICD-10-CM | POA: Diagnosis not present

## 2022-09-14 DIAGNOSIS — R21 Rash and other nonspecific skin eruption: Secondary | ICD-10-CM | POA: Insufficient documentation

## 2022-09-14 DIAGNOSIS — J440 Chronic obstructive pulmonary disease with acute lower respiratory infection: Secondary | ICD-10-CM | POA: Insufficient documentation

## 2022-09-14 DIAGNOSIS — C3412 Malignant neoplasm of upper lobe, left bronchus or lung: Secondary | ICD-10-CM | POA: Insufficient documentation

## 2022-09-14 DIAGNOSIS — K589 Irritable bowel syndrome without diarrhea: Secondary | ICD-10-CM | POA: Diagnosis not present

## 2022-09-14 DIAGNOSIS — F419 Anxiety disorder, unspecified: Secondary | ICD-10-CM | POA: Diagnosis not present

## 2022-09-14 DIAGNOSIS — I10 Essential (primary) hypertension: Secondary | ICD-10-CM | POA: Diagnosis not present

## 2022-09-14 NOTE — Progress Notes (Signed)
[name] OFFICE PROGRESS NOTE   Diagnosis: Non-small cell lung cancer  INTERVAL HISTORY:   Brooke [name] [name] returns for a scheduled visit.  She complains of left abdominal pain.  The pain is intermittent.  She feels something is trying to "move through "her colon.  The stool is "pencil "thin.  She has early satiety and constipation.  She takes Colace.  Citrucel and IBgard have not helped.  She has been evaluated by gastroenterology.  No vomiting.  No diarrhea.  No bleeding. Dyspnea has improved. Objective:  Vital signs in last 24 hours:  Blood pressure (!) 152/89, pulse 79, temperature 98.1 F (36.7 C), temperature source Oral, resp. rate 19, height 5\' 2"  (1.575 m), weight 81 lb 12.8 oz (37.1 kg), SpO2 100 %.    Lymphatics: No cervical, supraclavicular, or axillary nodes.  "Shotty "bilateral inguinal nodes Resp: Lungs clear bilaterally, no respiratory distress Cardio: Regular rate and rhythm GI: No mass, nontender, soft, no hepatosplenomegaly Vascular: No leg edema  Lab Results:  Lab Results  Component Value Date   WBC 4.1 03/16/2022   HGB 13.3 03/16/2022   HCT 40.9 03/16/2022   MCV 89.5 03/16/2022   PLT 202 03/16/2022   NEUTROABS 2.9 03/16/2022    CMP  Lab Results  Component Value Date   NA 137 07/10/2022   K 3.9 07/10/2022   CL 98 07/10/2022   CO2 33 (H) 07/10/2022   GLUCOSE 92 07/10/2022   BUN 10 07/10/2022   CREATININE 0.40 (L) 07/10/2022   CALCIUM 9.6 07/10/2022   PROT 7.0 07/10/2022   ALBUMIN 4.2 07/10/2022   AST 21 07/10/2022   ALT 25 07/10/2022   ALKPHOS 53 07/10/2022   BILITOT 0.5 07/10/2022   GFRNONAA >60 07/10/2022   GFRAA >60 02/05/2020    Medications: I have reviewed the patient's current medications.   Assessment/Plan: Left lung mass PET scan 02/28/2016-hypermetabolic left upper lobe mass, hypermetabolic adjacent nodule, hypermetabolic AP window node CT chest 10/28/2016-enlarging left upper lobe mass, increased AP window  lymphadenopathy, new spleen metastasis, new adenopathy at the pancreas tail, upper abdomen, and middle mediastinum CT-guided biopsy of the left lung mass on 11/01/2016, Non-small cell carcinoma most consistent with squamous cell carcinoma PDL1 60% Left lung radiation 11/08/2016 through 11/21/2016 CT chest 12/12/2016-reexpansion of left upper lobe with a decreased left upper lobe mass, unchanged mediastinal adenopathy, progression of a splenic metastasis/pancreatic tail/gastrohepatic ligament metastasis, right lower lobe pneumonia Cycle 1 Pembrolizumab 12/14/2016 Cycle 2 Pembrolizumab 01/03/2017 Cycle 3 Pembrolizumab 01/24/2017 Cycle 4 Pembrolizumab 02/12/2017 CT 03/05/2018-decrease in left upper lobe mass, mediastinal adenopathy, and splenic mass Cycle 5 pembrolizumab 03/07/2017 Cycle 6 Pembrolizumab 04/02/2017 Cycle 7 pembrolizumab 04/25/2017 Cycle 8 Pembrolizumab 05/14/2017 Cycle 9 Pembrolizumab 06/06/2017 CT 06/20/2017-mild decrease in left upper lobe mass, mild increase in paramediastinal left upper lobe nodule-radiation change?, decreased splenic metastasis Cycle 10 pembrolizumab 06/25/2017 Cycle 11 pembrolizumab 07/18/2017 Cycle 12 pembrolizumab 08/29/2017 Cycle 13 pembrolizumab 09/17/2017 Cycle 14 Pembrolizumab 10/10/2017 CT chest 10/24/2017- slight enlargement of small mediastinal lymph nodes, increased left upper lobe consolidation, decreased size of spleen lesion Cycle 15 pembrolizumab 10/29/2017 Cycle 16 Pembrolizumab 11/21/2017 Cycle 17 Pembrolizumab 12/11/2017 Cycle 18 pembrolizumab 01/02/2018 Cycle 19 Pembrolizumab 01/21/2018 Cycle 20 Pembrolizumab 02/13/2018 CT chest 02/27/2018- decreased left paratracheal node, progressive consolidation/fibrosis in the left upper lung, decreased size of splenic mass Cycle 21 pembrolizumab 03/04/2018 Cycle 22 Pembrolizumab 03/27/2018  Cycle 23 pembrolizumab 04/15/2018 Cycle 24 pembrolizumab 05/08/2018 Cycle 25 Pembrolizumab 05/27/2018 CT chest 06/16/2018-  stable left upper lung radiation fibrosis with no  evidence of local tumor recurrence.  Decreased left paratracheal adenopathy.  No new or progressive metastatic disease in the chest.  Gastrohepatic ligament lymphadenopathy stable.  Splenic metastasis decreased.  T5 vertebral compression fracture with associated patchy sclerosis and no discrete osseous lesion, new in the interval. Cycle 26 Pembrolizumab 06/19/2018 Cycle 27 pembrolizumab 07/08/2018 Cycle 28 pembrolizumab 07/31/2018 Cycle 29 pembrolizumab 08/19/2018 Cycle 30 Pembrolizumab 09/11/2018 Cycle 31 pembrolizumab 09/30/2018 Cycle 32 pembrolizumab 10/23/2018 CT chest 10/28/2018- left apical pleural-parenchymal opacity consistent with radiation scarring has become more confluent likely representing evolutionary change.  Continued further decrease in size of index left paratracheal node and splenic metastasis.  Gastrohepatic ligament lymph node not changed. Cycle 33 Pembrolizumab 11/11/2018 Cycle 34 pembrolizumab 12/04/2018 Cycle 35 pembrolizumab 12/23/2018 CT chest 02/12/2019- posttreatment changes left upper lobe unchanged.  Continued decrease in size of splenic metastasis.  Stable appearance of left paratracheal lymph nodes.  Stable gastrohepatic adenopathy.  Incomplete visualization of retroperitoneal lymph nodes in the upper abdomen. CT chest 07/02/2019-stable post treatment related changes left lung.  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  Slight regression of enlarged likely metastatic gastrohepatic lymph node. CT chest 11/11/2019-interval enlargement of a right paratracheal lymph node measuring 14 mm, increased from 5 mm.  No new lung mass or nodularity.  New adenopathy in the upper abdomen.  2.7 cm lesion gastrohepatic ligament.  Necrotic node at the splenic hilum measures 4.7 cm.  Large node along the IVC left upper quadrant measures 2.7 cm.  Similar node left adjacent the aorta measures 1.8 cm.  Enhancing lesion in the spleen is unchanged.  Several  low-density lesions in the liver are unchanged. Pembrolizumab resumed on a 3-week schedule 12/03/2019 CT chest 02/22/2020-interval resolution of previously demonstrated left paratracheal and upper abdominal adenopathy.  Stable treated metastasis within the spleen.  Stable radiation changes in the left lung with left hilar distortion.  No evidence of local recurrence or progressive metastatic disease. Continuation of pembrolizumab every 3 weeks 02/26/2020 CT chest 06/28/2020-no change in left upper lobe consolidation, unchanged spleen metastasis, no evidence of progressive disease Pembrolizumab continued every 3 weeks 07/01/2020 CT chest 11/02/2020-stable posttreatment changes at the left hilum and left upper lobe, decreased size of spleen lesion, no evidence of disease progression Pembrolizumab every 3 weeks continued CT chest 01/12/2021-negative for acute pulmonary embolus.  Stable radiation fibrosis left upper lobe.  Stable low-density lesion in the spleen.  No new or progressive findings. 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  Splenic lesion stable.  No upper abdominal lymphadenopathy. Pembrolizumab every 3 weeks continued CT chest 08/29/2021-unchanged consolidation/fibrosis at the left upper lung, no evidence of metastatic disease in the chest, COPD, multiple compression fractures, new T11 compression fracture, stable spleen lesion by my review Pembrolizumab every 3 weeks continued Treatment held 10/13/2021 for diarrhea, C. difficile negative Pembrolizumab 11/10/2021 Pembrolizumab 12/01/2021 CTs 12/07/2021-similar treatment effect within the left upper lobe including volume loss, traction bronchiectasis and consolidation.  No local recurrent or active metastatic disease.   Diffuse colitis most significant within the cecum.  New trace left pleural fluid. CTs 03/11/2022-stable radiation fibrosis in the upper left lung, no evidence of local tumor recurrence, no evidence of metastatic disease, normal large bowel without wall thickening CTs 07/10/2022-stable volume loss in the left hemithorax, stable small right middle lobe nodules, stable complex cystic/calcified lesion  in the spleen-slightly more dense, persistent isolated biliary dilation in segment 5 colonic stool, stable thoracic compression fractures     2.  05/29/2019 laryngoscopy-exophytic appearing mass mid right vocal cord.   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  Tumor cells negative for p16, CMV and HSV 2.  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 Radiation 07/29/2019-08/25/2019   3. History of acute respiratory failure secondary to #1   4. Oxygen dependent COPD   5. History of a NSTEMI 2015   6. Right lung pneumonia on chest CT 12/12/2016-treated with Levaquin   7.  Grade 2 rash possibly related to Pembrolizumab.  Treatment held 08/06/2017.  Improved 08/29/2017, treatment resumed, rash has resolved   8.  Vertigo January 2022-improved with course of Augmentin for ear/sinus infection   9.  Diarrhea beginning early June 2023; negative C. difficile 10/13/2021; CTs 12/07/2021-diffuse colitis most significant within the cecum.  10.  Isolated segment 5 bile duct dilation      Disposition: Brooke [name] [name] has a history of metastatic non-small cell lung cancer.  She is in clinical remission.  I doubt her current symptoms are related to lung cancer.  She was suspected of having colitis related to pembrolizumab last year. No diarrhea at present.  She reports intermittent abdominal pain, constipation, and thin stool.  I doubt her symptoms are related to lung cancer.  She will try Senokot-S and MiraLAX.  If her symptoms do not improve I recommend she follow-up with Dr.  Marina Goodell.  She may be a candidate for a barium enema.  Brooke [name] [name] will return for an office visit in 2 months.  Brooke [name] Papas, MD  09/14/2022  9:51 AM

## 2022-09-17 DIAGNOSIS — I10 Essential (primary) hypertension: Secondary | ICD-10-CM | POA: Diagnosis not present

## 2022-09-17 DIAGNOSIS — J9610 Chronic respiratory failure, unspecified whether with hypoxia or hypercapnia: Secondary | ICD-10-CM | POA: Diagnosis not present

## 2022-09-17 DIAGNOSIS — K219 Gastro-esophageal reflux disease without esophagitis: Secondary | ICD-10-CM | POA: Diagnosis not present

## 2022-09-17 DIAGNOSIS — C787 Secondary malignant neoplasm of liver and intrahepatic bile duct: Secondary | ICD-10-CM | POA: Diagnosis not present

## 2022-09-17 DIAGNOSIS — J449 Chronic obstructive pulmonary disease, unspecified: Secondary | ICD-10-CM | POA: Diagnosis not present

## 2022-09-17 DIAGNOSIS — K589 Irritable bowel syndrome without diarrhea: Secondary | ICD-10-CM | POA: Diagnosis not present

## 2022-09-17 DIAGNOSIS — E785 Hyperlipidemia, unspecified: Secondary | ICD-10-CM | POA: Diagnosis not present

## 2022-09-17 DIAGNOSIS — C3412 Malignant neoplasm of upper lobe, left bronchus or lung: Secondary | ICD-10-CM | POA: Diagnosis not present

## 2022-09-17 DIAGNOSIS — C7889 Secondary malignant neoplasm of other digestive organs: Secondary | ICD-10-CM | POA: Diagnosis not present

## 2022-09-17 DIAGNOSIS — I2510 Atherosclerotic heart disease of native coronary artery without angina pectoris: Secondary | ICD-10-CM | POA: Diagnosis not present

## 2022-09-17 DIAGNOSIS — Z9981 Dependence on supplemental oxygen: Secondary | ICD-10-CM | POA: Diagnosis not present

## 2022-09-17 DIAGNOSIS — J701 Chronic and other pulmonary manifestations due to radiation: Secondary | ICD-10-CM | POA: Diagnosis not present

## 2022-09-17 DIAGNOSIS — F419 Anxiety disorder, unspecified: Secondary | ICD-10-CM | POA: Diagnosis not present

## 2022-09-17 DIAGNOSIS — I252 Old myocardial infarction: Secondary | ICD-10-CM | POA: Diagnosis not present

## 2022-09-19 ENCOUNTER — Telehealth: Payer: Self-pay | Admitting: *Deleted

## 2022-09-19 NOTE — Telephone Encounter (Signed)
Brooke [name] returned call. Explained to her the virtual colonoscopy. She would like to discuss this Dr. Marina Goodell or his staff before making a decision. Notified Dr. Lamar Sprinkles nurse of request.

## 2022-09-19 NOTE — Telephone Encounter (Signed)
Dr. Marina Goodell is suggesting virtual colonoscopy to evaluate her GI symptoms if she would like to further investigate her issues.  Left VM for patient to return call to discuss.

## 2022-09-20 ENCOUNTER — Telehealth: Payer: Self-pay

## 2022-09-20 DIAGNOSIS — R194 Change in bowel habit: Secondary | ICD-10-CM

## 2022-09-20 DIAGNOSIS — R935 Abnormal findings on diagnostic imaging of other abdominal regions, including retroperitoneum: Secondary | ICD-10-CM

## 2022-09-20 DIAGNOSIS — R1032 Left lower quadrant pain: Secondary | ICD-10-CM

## 2022-09-20 NOTE — Telephone Encounter (Signed)
-----   Message from Wandalee Ferdinand, RN sent at 09/19/2022  2:45 PM EDT ----- Regarding: Virtual Colonoscopy [place],  Dr. Truett Perna and Dr. Marina Goodell have discussed her recent GI issues w/LUQ abdominal pain and "pencil stools". He suggested virtual colonoscopy. She would like to discuss this Dr. Marina Goodell or his staff before making a decision. Thanks, Darl Pikes

## 2022-09-20 NOTE — Telephone Encounter (Signed)
Spoke with pt regarding virtual colon. She had multiple questions regarding the prep for the procedure and if air was used in the procedure. Discussed with her that radiology provides the prep and the instructions. Patient was given the phone number for [facility] [phone] to get more information regarding the procedure. Discussed with patient that if she decides to proceed with virtual colon to call out office back and we can place the order. Patient verbalized understanding.

## 2022-09-24 ENCOUNTER — Other Ambulatory Visit: Payer: Self-pay

## 2022-09-24 NOTE — Telephone Encounter (Signed)
Patient call stating she spoke with [facility] and they informed her they are unable to schedule her until a order is put in. She is requesting a call back regarding this. Please advise, thank you.

## 2022-09-25 NOTE — Addendum Note (Signed)
Addended by: Chrystie Nose on: 09/25/2022 08:45 AM   Modules accepted: Orders

## 2022-09-25 NOTE — Telephone Encounter (Signed)
Order placed for virtual colon. Pt knows they will contact her regarding appt or she can call GI to set up the appt.

## 2022-09-28 DIAGNOSIS — I2510 Atherosclerotic heart disease of native coronary artery without angina pectoris: Secondary | ICD-10-CM | POA: Diagnosis not present

## 2022-09-28 DIAGNOSIS — K219 Gastro-esophageal reflux disease without esophagitis: Secondary | ICD-10-CM | POA: Diagnosis not present

## 2022-09-28 DIAGNOSIS — C787 Secondary malignant neoplasm of liver and intrahepatic bile duct: Secondary | ICD-10-CM | POA: Diagnosis not present

## 2022-09-28 DIAGNOSIS — J9610 Chronic respiratory failure, unspecified whether with hypoxia or hypercapnia: Secondary | ICD-10-CM | POA: Diagnosis not present

## 2022-09-28 DIAGNOSIS — C3412 Malignant neoplasm of upper lobe, left bronchus or lung: Secondary | ICD-10-CM | POA: Diagnosis not present

## 2022-09-28 DIAGNOSIS — F419 Anxiety disorder, unspecified: Secondary | ICD-10-CM | POA: Diagnosis not present

## 2022-09-28 DIAGNOSIS — K589 Irritable bowel syndrome without diarrhea: Secondary | ICD-10-CM | POA: Diagnosis not present

## 2022-09-28 DIAGNOSIS — C7889 Secondary malignant neoplasm of other digestive organs: Secondary | ICD-10-CM | POA: Diagnosis not present

## 2022-09-28 DIAGNOSIS — Z9981 Dependence on supplemental oxygen: Secondary | ICD-10-CM | POA: Diagnosis not present

## 2022-09-28 DIAGNOSIS — I252 Old myocardial infarction: Secondary | ICD-10-CM | POA: Diagnosis not present

## 2022-09-28 DIAGNOSIS — J449 Chronic obstructive pulmonary disease, unspecified: Secondary | ICD-10-CM | POA: Diagnosis not present

## 2022-09-28 DIAGNOSIS — E785 Hyperlipidemia, unspecified: Secondary | ICD-10-CM | POA: Diagnosis not present

## 2022-09-28 DIAGNOSIS — J701 Chronic and other pulmonary manifestations due to radiation: Secondary | ICD-10-CM | POA: Diagnosis not present

## 2022-09-28 DIAGNOSIS — I10 Essential (primary) hypertension: Secondary | ICD-10-CM | POA: Diagnosis not present

## 2022-10-12 DIAGNOSIS — J449 Chronic obstructive pulmonary disease, unspecified: Secondary | ICD-10-CM | POA: Diagnosis not present

## 2022-11-06 ENCOUNTER — Telehealth: Payer: Self-pay | Admitting: Internal Medicine

## 2022-11-06 NOTE — Telephone Encounter (Signed)
Bonita Quin,  I spoke with Mr. Carmie Kanner, "Nurse interceptor", who has approved virtual colonoscopy. The order number is [id number] It is valid November 06, 2022 through December 05, 2022.  Please make sure that she gets her virtual colonoscopy, in this time frame, as previously requested.  Thanks,   Dr. Marina Goodell

## 2022-11-06 NOTE — Telephone Encounter (Unsigned)
CT Virtual Colonoscopy  Case went to further clinical review with Carelon but after speaking with RN Sherron Monday I was informed the provider could call and perform a peer to peer.  Order ID: [id number]    Peer to peer: [phone] Opt 1 Opt 1 Opt 2

## 2022-11-11 DIAGNOSIS — J449 Chronic obstructive pulmonary disease, unspecified: Secondary | ICD-10-CM | POA: Diagnosis not present

## 2022-11-12 ENCOUNTER — Telehealth: Payer: Self-pay | Admitting: Nurse Practitioner

## 2022-11-12 NOTE — Telephone Encounter (Signed)
Inbound call from patient stating she is having very thin stools and it is causing her a lot of pain. Also would like to know how much laxative she can take at once. Requesting a call back regarding this. Please advise, thank you.

## 2022-11-13 NOTE — Telephone Encounter (Signed)
Pt stated that she has been having pencil like stool and feeling constipated recently. Pt is questioning if she can proceed with the prep for the CT virtual Colonoscopy on 11/15/2022: Last BM today Pt was notified that the prep should assist with the constipation and that she should proceed with the Prep. Pt verbalized understanding with all questions answered.

## 2022-11-15 ENCOUNTER — Ambulatory Visit
Admission: RE | Admit: 2022-11-15 | Discharge: 2022-11-15 | Disposition: A | Discharge status: Home / Self Care | Payer: Medicare Other | Source: Ambulatory Visit | Attending: Internal Medicine | Admitting: Internal Medicine

## 2022-11-15 ENCOUNTER — Emergency Department (HOSPITAL_COMMUNITY)
Admission: EM | Admit: 2022-11-15 | Discharge: 2022-11-15 | Disposition: A | Discharge status: Home / Self Care | Payer: Medicare Other | Attending: Emergency Medicine | Admitting: Emergency Medicine

## 2022-11-15 ENCOUNTER — Other Ambulatory Visit: Payer: Self-pay

## 2022-11-15 ENCOUNTER — Encounter (HOSPITAL_COMMUNITY): Payer: Self-pay

## 2022-11-15 DIAGNOSIS — I10 Essential (primary) hypertension: Secondary | ICD-10-CM | POA: Diagnosis not present

## 2022-11-15 DIAGNOSIS — E860 Dehydration: Secondary | ICD-10-CM | POA: Insufficient documentation

## 2022-11-15 DIAGNOSIS — E871 Hypo-osmolality and hyponatremia: Secondary | ICD-10-CM | POA: Diagnosis not present

## 2022-11-15 DIAGNOSIS — R531 Weakness: Secondary | ICD-10-CM | POA: Diagnosis not present

## 2022-11-15 DIAGNOSIS — R109 Unspecified abdominal pain: Secondary | ICD-10-CM | POA: Diagnosis not present

## 2022-11-15 DIAGNOSIS — R1032 Left lower quadrant pain: Secondary | ICD-10-CM

## 2022-11-15 DIAGNOSIS — R935 Abnormal findings on diagnostic imaging of other abdominal regions, including retroperitoneum: Secondary | ICD-10-CM

## 2022-11-15 DIAGNOSIS — R0902 Hypoxemia: Secondary | ICD-10-CM | POA: Diagnosis not present

## 2022-11-15 DIAGNOSIS — R194 Change in bowel habit: Secondary | ICD-10-CM

## 2022-11-15 DIAGNOSIS — R42 Dizziness and giddiness: Secondary | ICD-10-CM | POA: Diagnosis not present

## 2022-11-15 DIAGNOSIS — R112 Nausea with vomiting, unspecified: Secondary | ICD-10-CM | POA: Diagnosis not present

## 2022-11-15 DIAGNOSIS — R195 Other fecal abnormalities: Secondary | ICD-10-CM | POA: Diagnosis not present

## 2022-11-15 DIAGNOSIS — I499 Cardiac arrhythmia, unspecified: Secondary | ICD-10-CM | POA: Diagnosis not present

## 2022-11-15 LAB — CBC WITH DIFFERENTIAL/PLATELET
Abs Immature Granulocytes: 0.03 10*3/uL (ref 0.00–0.07)
Basophils Absolute: 0 10*3/uL (ref 0.0–0.1)
Basophils Relative: 0 %
Eosinophils Absolute: 0 10*3/uL (ref 0.0–0.5)
Eosinophils Relative: 0 %
HCT: 39.4 % (ref 36.0–46.0)
Hemoglobin: 13 g/dL (ref 12.0–15.0)
Immature Granulocytes: 0 %
Lymphocytes Relative: 4 %
Lymphs Abs: 0.5 10*3/uL — ABNORMAL LOW (ref 0.7–4.0)
MCH: 29.1 pg (ref 26.0–34.0)
MCHC: 33 g/dL (ref 30.0–36.0)
MCV: 88.3 fL (ref 80.0–100.0)
Monocytes Absolute: 0.3 10*3/uL (ref 0.1–1.0)
Monocytes Relative: 3 %
Neutro Abs: 11.3 10*3/uL — ABNORMAL HIGH (ref 1.7–7.7)
Neutrophils Relative %: 93 %
Platelets: 174 10*3/uL (ref 150–400)
RBC: 4.46 MIL/uL (ref 3.87–5.11)
RDW: 12.7 % (ref 11.5–15.5)
WBC: 12.1 10*3/uL — ABNORMAL HIGH (ref 4.0–10.5)
nRBC: 0 % (ref 0.0–0.2)

## 2022-11-15 LAB — COMPREHENSIVE METABOLIC PANEL
ALT: 37 U/L (ref 0–44)
AST: 37 U/L (ref 15–41)
Albumin: 4 g/dL (ref 3.5–5.0)
Alkaline Phosphatase: 58 U/L (ref 38–126)
Anion gap: 16 — ABNORMAL HIGH (ref 5–15)
BUN: 9 mg/dL (ref 8–23)
CO2: 20 mmol/L — ABNORMAL LOW (ref 22–32)
Calcium: 8.9 mg/dL (ref 8.9–10.3)
Chloride: 91 mmol/L — ABNORMAL LOW (ref 98–111)
Creatinine, Ser: 0.55 mg/dL (ref 0.44–1.00)
GFR, Estimated: >60 mL/min (ref >60)
Glucose, Bld: 99 mg/dL (ref 70–99)
Potassium: 3.5 mmol/L (ref 3.5–5.1)
Sodium: 127 mmol/L — ABNORMAL LOW (ref 135–145)
Total Bilirubin: 1.3 mg/dL — ABNORMAL HIGH (ref 0.3–1.2)
Total Protein: 6.5 g/dL (ref 6.5–8.1)

## 2022-11-15 LAB — MAGNESIUM: Magnesium: 1.8 mg/dL (ref 1.7–2.4)

## 2022-11-15 MED ORDER — LACTATED RINGERS IV BOLUS
1000.0000 mL | Freq: Once | INTRAVENOUS | Status: AC
  Administered 2022-11-15: 1000 mL via INTRAVENOUS

## 2022-11-15 MED ORDER — ACETAMINOPHEN 500 MG PO TABS
1000.0000 mg | ORAL_TABLET | Freq: Once | ORAL | Status: DC
  Filled 2022-11-15: qty 2

## 2022-11-15 NOTE — ED Provider Notes (Signed)
[place] EMERGENCY DEPARTMENT AT [facility] Provider Note   CSN: [id number] Arrival date & time: 11/15/22  0158     History  Chief Complaint  Patient presents with   Dizziness    Patient to ED via EMS with complaint of dizziness, nausea, vomiting and diarrhea after preparing for a colonoscopy using Go-Lightly. EMS reports giving patient 500 mL NS and 4 mg zofran. EMS reports patient seemed confused prior to Fluid bolus but improved and had a GCS of 15 after 500 mL normal saline.   Emesis    Brooke [name] is a 77 y.o. female.  Patient doing a bowel cleanout for a 'virtual colonoscopy' and started havign worsening diarrhea, nausea and weakness with dizziness so came here. Initially BP in the 70's systolic with EMS. Fluids/zofran given. No other complaints.    Dizziness Associated symptoms: vomiting   Emesis      Home Medications Prior to Admission medications   Medication Sig Start Date End Date Taking? Authorizing Provider  acetaminophen (TYLENOL) 325 MG tablet Per bottle as needed    [provider]  albuterol (VENTOLIN HFA) 108 (90 Base) MCG/ACT inhaler Inhale 2 puffs into the lungs every 4 (four) hours as needed. 03/16/21   [provider]  ALPRAZolam Prudy Feeler) 0.25 MG tablet Take 0.5-1 tablets by mouth 2 (two) times daily as needed. 07/20/21   [provider]  amLODipine (NORVASC) 2.5 MG tablet TAKE 1 TABLET IF BLOOD PRESSURE(140-159), 2 TABLETS(160-179), 3 TABLETS FOR BLOOD PRESSURE GREATER THAN 180 07/17/21   Chilton Si, MD  atorvastatin (LIPITOR) 20 MG tablet TAKE 4 TABLETS(80 MG) BY MOUTH DAILY 07/09/22   Chilton Si, MD  budesonide-formoterol [facility]) 160-4.5 MCG/ACT inhaler Inhale 2 puffs into the lungs 2 (two) times daily. 12/28/20   [provider]  docusate sodium (COLACE) 100 MG capsule Take 100 mg by mouth 2 (two) times daily.    [provider]  guaiFENesin (MUCINEX) 600 MG 12 hr tablet  Take 600 mg by mouth 2 (two) times daily as needed for cough or to loosen phlegm.    [provider]  ipratropium-albuterol (DUONEB) 0.5-2.5 (3) MG/3ML SOLN Take 3 mLs by nebulization every 4 (four) hours as needed (wheezing/shortness of breath). **PLAN C**    [provider]  OXYGEN 2lpm 24/7    [provider]  Simethicone 180 MG CAPS Use as needed    [provider]      Allergies    Afrin [oxymetazoline], Pulmicort [budesonide], Sulfa antibiotics, Nitrofuran derivatives, Codeine, Imdur [isosorbide dinitrate], and Prednisone    Review of Systems   Review of Systems  Gastrointestinal:  Positive for vomiting.  Neurological:  Positive for dizziness.    Physical Exam Updated Vital Signs BP (!) 157/99   Pulse 94   Temp 97.8 F (36.6 C) (Oral)   Resp 19   Ht 5\' 2"  (1.575 m)   Wt 37 kg   SpO2 99%   BMI 14.92 kg/m  Physical Exam Vitals and nursing note reviewed.  Constitutional:      Appearance: She is well-developed.  HENT:     Head: Normocephalic and atraumatic.  Eyes:     Pupils: Pupils are equal, round, and reactive to light.  Cardiovascular:     Rate and Rhythm: Normal rate and regular rhythm.  Pulmonary:     Effort: No respiratory distress.     Breath sounds: No stridor.  Abdominal:     General: There is no distension.  Musculoskeletal:  Cervical back: Normal range of motion.  Neurological:     Mental Status: She is alert.     ED Results / Procedures / Treatments   Labs (all labs ordered are listed, but only abnormal results are displayed) Labs Reviewed  CBC WITH DIFFERENTIAL/PLATELET - Abnormal; Notable for the following components:      Result Value   WBC 12.1 (*)    Neutro Abs 11.3 (*)    Lymphs Abs 0.5 (*)    All other components within normal limits  COMPREHENSIVE METABOLIC PANEL - Abnormal; Notable for the following components:   Sodium 127 (*)    Chloride 91 (*)    CO2 20 (*)    Total Bilirubin 1.3 (*)     Anion gap 16 (*)    All other components within normal limits  MAGNESIUM    EKG None  Radiology No results found.  Procedures Procedures    Medications Ordered in ED Medications  acetaminophen (TYLENOL) tablet 1,000 mg (1,000 mg Oral Patient Refused/Not Given 11/15/22 0511)  lactated ringers bolus 1,000 mL (0 mLs Intravenous Stopped 11/15/22 0459)    ED Course/ Medical Decision Making/ A&P                             Medical Decision Making Amount and/or Complexity of Data Reviewed Labs: ordered. ECG/medicine tests: ordered.  Risk OTC drugs.   BP improved prior to arrival. Found to have mild hyponatremia but no other electrolyte abnormalities. Fluids given. Feels better. VSS. No fever, suspect hypovolemia as the cause for symptoms. Stable for d/c w/ pcp follow up to recheck sodium.    Final Clinical Impression(s) / ED Diagnoses Final diagnoses:  Dehydration  Hyponatremia    Rx / DC Orders ED Discharge Orders     None         Amrita Radu, Barbara Cower, MD 11/15/22 [phone]

## 2022-11-23 ENCOUNTER — Inpatient Hospital Stay: Payer: Medicare Other | Attending: Oncology | Admitting: Oncology

## 2022-11-23 VITALS — BP 137/70 | HR 86 | Temp 97.7°F | Resp 18 | Ht 62.0 in | Wt 80.0 lb

## 2022-11-23 DIAGNOSIS — C3412 Malignant neoplasm of upper lobe, left bronchus or lung: Secondary | ICD-10-CM | POA: Diagnosis not present

## 2022-11-23 DIAGNOSIS — Z923 Personal history of irradiation: Secondary | ICD-10-CM | POA: Diagnosis not present

## 2022-11-23 DIAGNOSIS — J440 Chronic obstructive pulmonary disease with acute lower respiratory infection: Secondary | ICD-10-CM | POA: Diagnosis not present

## 2022-11-23 DIAGNOSIS — Z85118 Personal history of other malignant neoplasm of bronchus and lung: Secondary | ICD-10-CM | POA: Diagnosis not present

## 2022-11-23 DIAGNOSIS — Z9981 Dependence on supplemental oxygen: Secondary | ICD-10-CM | POA: Insufficient documentation

## 2022-11-23 DIAGNOSIS — Z9221 Personal history of antineoplastic chemotherapy: Secondary | ICD-10-CM | POA: Diagnosis not present

## 2022-11-23 DIAGNOSIS — I252 Old myocardial infarction: Secondary | ICD-10-CM | POA: Insufficient documentation

## 2022-11-23 MED ORDER — POLYETHYLENE GLYCOL 3350 17 G PO PACK: 8.5000 g | PACK | Freq: Every day | ORAL | Status: DC

## 2022-11-23 NOTE — Progress Notes (Unsigned)
[facility] OFFICE PROGRESS NOTE   Diagnosis: Lung cancer  INTERVAL HISTORY:   Brooke [name] reports improvement in dyspnea. She continues to have constipation and left abdominal pain. A virtual colonoscopy on 7/11 did not reveal a stricture, mass, polyp, or evidence of metastatic colon cancer  Objective:  Vital signs in last 24 hours:  Blood pressure 137/70, pulse 86, temperature 97.7 F (36.5 C), temperature source Oral, resp. rate 18, height 5\' 2"  (1.575 m), weight 80 lb (36.3 kg), SpO2 100%.    Lymphatics: no cervical, supraclavicular, axillary, or inguinal nodes Resp: clear bilaterally Cardio: RRR GI: no mass, soft, no hsm, nontender Vascular: no leg edema   Lab Results:  Lab Results  Component Value Date   WBC 12.1 (H) 11/15/2022   HGB 13.0 11/15/2022   HCT 39.4 11/15/2022   MCV 88.3 11/15/2022   PLT 174 11/15/2022   NEUTROABS 11.3 (H) 11/15/2022    CMP  Lab Results  Component Value Date   NA 127 (L) 11/15/2022   K 3.5 11/15/2022   CL 91 (L) 11/15/2022   CO2 20 (L) 11/15/2022   GLUCOSE 99 11/15/2022   BUN 9 11/15/2022   CREATININE 0.55 11/15/2022   CALCIUM 8.9 11/15/2022   PROT 6.5 11/15/2022   ALBUMIN 4.0 11/15/2022   AST 37 11/15/2022   ALT 37 11/15/2022   ALKPHOS 58 11/15/2022   BILITOT 1.3 (H) 11/15/2022   GFRNONAA >60 11/15/2022   GFRAA >60 02/05/2020    Medications: I have reviewed the patient's current medications.   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% Left lung radiation 11/08/2016 through  11/21/2016 CT chest 12/12/2016-reexpansion of left upper lobe with a decreased left upper lobe mass, unchanged mediastinal adenopathy, progression of a splenic metastasis/pancreatic tail/gastrohepatic ligament metastasis, right lower lobe pneumonia Cycle 1 Pembrolizumab 12/14/2016 Cycle 2 Pembrolizumab 01/03/2017 Cycle 3 Pembrolizumab 01/24/2017 Cycle 4 Pembrolizumab 02/12/2017 CT 03/05/2018-decrease in left upper lobe mass, mediastinal adenopathy, and splenic mass Cycle 5 pembrolizumab 03/07/2017 Cycle 6 Pembrolizumab 04/02/2017 Cycle 7 pembrolizumab 04/25/2017 Cycle 8 Pembrolizumab 05/14/2017 Cycle 9 Pembrolizumab 06/06/2017 CT 06/20/2017-mild decrease in left upper lobe mass, mild increase in paramediastinal left upper lobe nodule-radiation change?, decreased splenic metastasis Cycle 10 pembrolizumab 06/25/2017 Cycle 11 pembrolizumab 07/18/2017 Cycle 12 pembrolizumab 08/29/2017 Cycle 13 pembrolizumab 09/17/2017 Cycle 14 Pembrolizumab 10/10/2017 CT chest 10/24/2017- slight enlargement of small mediastinal lymph nodes, increased left upper lobe consolidation, decreased size of spleen lesion Cycle 15 pembrolizumab 10/29/2017 Cycle 16 Pembrolizumab 11/21/2017 Cycle 17 Pembrolizumab 12/11/2017 Cycle 18 pembrolizumab 01/02/2018 Cycle 19 Pembrolizumab 01/21/2018 Cycle 20 Pembrolizumab 02/13/2018 CT chest 02/27/2018- decreased left paratracheal node, progressive consolidation/fibrosis in the left upper lung, decreased size of splenic mass Cycle 21 pembrolizumab 03/04/2018 Cycle 22 Pembrolizumab 03/27/2018  Cycle 23 pembrolizumab 04/15/2018 Cycle 24 pembrolizumab 05/08/2018 Cycle 25 Pembrolizumab 05/27/2018 CT chest 06/16/2018- stable left upper lung radiation fibrosis with no evidence of local tumor recurrence.  Decreased left paratracheal adenopathy.  No new or progressive metastatic disease in the chest.  Gastrohepatic ligament lymphadenopathy stable.  Splenic metastasis decreased.  T5 vertebral compression  fracture with associated patchy sclerosis and no discrete osseous lesion, new in the interval. Cycle 26 Pembrolizumab 06/19/2018  Cycle 27 pembrolizumab 07/08/2018 Cycle 28 pembrolizumab 07/31/2018 Cycle 29 pembrolizumab 08/19/2018 Cycle 30 Pembrolizumab 09/11/2018 Cycle 31 pembrolizumab 09/30/2018 Cycle 32 pembrolizumab 10/23/2018 CT chest 10/28/2018- left apical pleural-parenchymal opacity consistent with radiation scarring has become more confluent likely representing evolutionary change.  Continued further decrease in size of index left paratracheal node and splenic metastasis.  Gastrohepatic ligament lymph node not changed. Cycle 33 Pembrolizumab 11/11/2018 Cycle 34 pembrolizumab 12/04/2018 Cycle 35 pembrolizumab 12/23/2018 CT chest 02/12/2019- posttreatment changes left upper lobe unchanged.  Continued decrease in size of splenic metastasis.  Stable appearance of left paratracheal lymph nodes.  Stable gastrohepatic adenopathy.  Incomplete visualization of retroperitoneal lymph nodes in the upper abdomen. CT chest 07/02/2019-stable post treatment related changes left lung.  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  Slight regression of enlarged likely metastatic gastrohepatic lymph node. CT chest 11/11/2019-interval enlargement of a right paratracheal lymph node measuring 14 mm, increased from 5 mm.  No new lung mass or nodularity.  New adenopathy in the upper abdomen.  2.7 cm lesion gastrohepatic ligament.  Necrotic node at the splenic hilum measures 4.7 cm.  Large node along the IVC left upper quadrant measures 2.7 cm.  Similar node left adjacent the aorta measures 1.8 cm.  Enhancing lesion in the spleen is unchanged.  Several low-density lesions in the liver are unchanged. Pembrolizumab resumed on a 3-week schedule 12/03/2019 CT chest 02/22/2020-interval resolution of previously demonstrated left paratracheal and upper abdominal adenopathy.  Stable treated metastasis within the spleen.  Stable radiation changes  in the left lung with left hilar distortion.  No evidence of local recurrence or progressive metastatic disease. Continuation of pembrolizumab every 3 weeks 02/26/2020 CT chest 06/28/2020-no change in left upper lobe consolidation, unchanged spleen metastasis, no evidence of progressive disease Pembrolizumab continued every 3 weeks 07/01/2020 CT chest 11/02/2020-stable posttreatment changes at the left hilum and left upper lobe, decreased size of spleen lesion, no evidence of disease progression Pembrolizumab every 3 weeks continued CT chest 01/12/2021-negative for acute pulmonary embolus.  Stable radiation fibrosis left upper lobe.  Stable low-density lesion in the spleen.  No new or progressive findings. 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  Splenic lesion stable.  No upper abdominal lymphadenopathy. Pembrolizumab every 3 weeks continued CT chest 08/29/2021-unchanged consolidation/fibrosis at the left upper lung, no evidence of metastatic disease in the chest, COPD, multiple compression fractures, new T11 compression fracture, stable spleen lesion by my review Pembrolizumab every 3 weeks continued Treatment held 10/13/2021 for diarrhea, C. difficile negative Pembrolizumab 11/10/2021 Pembrolizumab 12/01/2021 CTs 12/07/2021-similar treatment effect within the left upper lobe including volume loss, traction bronchiectasis and consolidation.  No local recurrent or active metastatic disease.  Diffuse colitis most significant within the cecum.  New trace left pleural fluid. CTs 03/11/2022-stable radiation fibrosis in the upper left lung, no evidence of local tumor recurrence, no evidence of metastatic disease, normal large bowel without wall thickening CTs 07/10/2022-stable volume  loss in the left hemithorax, stable small right middle lobe nodules, stable complex cystic/calcified lesion in the spleen-slightly more dense, persistent isolated biliary dilation in segment 5 colonic stool, stable thoracic compression fractures     2.  05/29/2019 laryngoscopy-exophytic appearing mass mid right vocal cord.   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  Tumor cells negative for p16, CMV and HSV  2.  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 Radiation 07/29/2019-08/25/2019   3. History of acute respiratory failure secondary to #1   4. Oxygen dependent COPD   5. History of a NSTEMI 2015   6. Right lung pneumonia on chest CT 12/12/2016-treated with Levaquin   7.  Grade 2 rash possibly related to Pembrolizumab.  Treatment held 08/06/2017.  Improved 08/29/2017, treatment resumed, rash has resolved   8.  Vertigo January 2022-improved with course of Augmentin for ear/sinus infection   9.  Diarrhea beginning early June 2023; negative C. difficile 10/13/2021; CTs 12/07/2021-diffuse colitis most significant within the cecum. -persistent left abdominal pain and constipation, Virtual colonosocopy 11/15/2022- no mass, stricture, or polyp  10.  Isolated segment 5 bile duct dilation       Disposition: Brooke [name] remains in clinical remission from lung cancer.  The virtual colonoscopy revealed no evidence of a malignancy, and no explanation for her GI symptoms.  She will begin a trial of miralax and return in 3-4 weeks  Thornton Papas, MD  11/23/2022  10:02 AM

## 2022-12-04 ENCOUNTER — Telehealth: Payer: Self-pay | Admitting: Internal Medicine

## 2022-12-04 NOTE — Telephone Encounter (Signed)
PT is calling to discuss results of virtual colonoscopy and to see what the next steps would be for her care. Please advise.

## 2022-12-04 NOTE — Telephone Encounter (Signed)
Virtual colon results per Dr. Marina Goodell discussed with pt. Pt wants to know what the plan is to help her with her chronic constipation and her "pencil-like stool". Pt states she tried fiber as Dr. Marina Goodell recommended and it made her feel sick on her stomach. Reports she has terrible pain in the bend from her colon that leads to her rectum. States the pain wakes her up at night. Asked her if she was taking anything for constipation and she states Dr. Truett Perna told her to take 1/2 dose of miralax daily and colace. Pt reports round worms can cause pencil stools and she has not been tested for this. Pt want to know what Dr. Marina Goodell recommends. Please advise.

## 2022-12-05 NOTE — Telephone Encounter (Signed)
She does not have warms. The shape of her stools is of no concern. Take MiraLAX daily for constipation.  Adjust the dose to achieve the desired result. If she wants to bulk up her stools, she could add 2 tablespoons of Citrucel daily

## 2022-12-05 NOTE — Telephone Encounter (Signed)
Spoke with pt and she is aware of Dr. Perry's recommendations. 

## 2022-12-10 DIAGNOSIS — J9611 Chronic respiratory failure with hypoxia: Secondary | ICD-10-CM | POA: Diagnosis not present

## 2022-12-10 DIAGNOSIS — C787 Secondary malignant neoplasm of liver and intrahepatic bile duct: Secondary | ICD-10-CM | POA: Diagnosis not present

## 2022-12-10 DIAGNOSIS — Z9181 History of falling: Secondary | ICD-10-CM | POA: Diagnosis not present

## 2022-12-10 DIAGNOSIS — Z9981 Dependence on supplemental oxygen: Secondary | ICD-10-CM | POA: Diagnosis not present

## 2022-12-12 DIAGNOSIS — J449 Chronic obstructive pulmonary disease, unspecified: Secondary | ICD-10-CM | POA: Diagnosis not present

## 2022-12-20 ENCOUNTER — Ambulatory Visit: Payer: Medicare Other | Admitting: Nurse Practitioner

## 2022-12-20 ENCOUNTER — Other Ambulatory Visit: Payer: Medicare Other

## 2022-12-27 ENCOUNTER — Inpatient Hospital Stay (HOSPITAL_BASED_OUTPATIENT_CLINIC_OR_DEPARTMENT_OTHER): Payer: Medicare Other | Admitting: Nurse Practitioner

## 2022-12-27 ENCOUNTER — Inpatient Hospital Stay: Payer: Medicare Other | Attending: Oncology

## 2022-12-27 ENCOUNTER — Encounter: Payer: Self-pay | Admitting: Nurse Practitioner

## 2022-12-27 VITALS — BP 138/67 | HR 80 | Temp 97.9°F | Resp 18 | Ht 62.0 in | Wt 84.0 lb

## 2022-12-27 DIAGNOSIS — K529 Noninfective gastroenteritis and colitis, unspecified: Secondary | ICD-10-CM | POA: Diagnosis not present

## 2022-12-27 DIAGNOSIS — J189 Pneumonia, unspecified organism: Secondary | ICD-10-CM | POA: Insufficient documentation

## 2022-12-27 DIAGNOSIS — M4854XA Collapsed vertebra, not elsewhere classified, thoracic region, initial encounter for fracture: Secondary | ICD-10-CM | POA: Insufficient documentation

## 2022-12-27 DIAGNOSIS — Z79899 Other long term (current) drug therapy: Secondary | ICD-10-CM | POA: Diagnosis not present

## 2022-12-27 DIAGNOSIS — C3412 Malignant neoplasm of upper lobe, left bronchus or lung: Secondary | ICD-10-CM

## 2022-12-27 DIAGNOSIS — Z9981 Dependence on supplemental oxygen: Secondary | ICD-10-CM | POA: Insufficient documentation

## 2022-12-27 DIAGNOSIS — J440 Chronic obstructive pulmonary disease with acute lower respiratory infection: Secondary | ICD-10-CM | POA: Diagnosis not present

## 2022-12-27 DIAGNOSIS — Z923 Personal history of irradiation: Secondary | ICD-10-CM | POA: Insufficient documentation

## 2022-12-27 DIAGNOSIS — Z9221 Personal history of antineoplastic chemotherapy: Secondary | ICD-10-CM | POA: Insufficient documentation

## 2022-12-27 DIAGNOSIS — Z85118 Personal history of other malignant neoplasm of bronchus and lung: Secondary | ICD-10-CM | POA: Insufficient documentation

## 2022-12-27 DIAGNOSIS — K5900 Constipation, unspecified: Secondary | ICD-10-CM | POA: Diagnosis not present

## 2022-12-27 DIAGNOSIS — I252 Old myocardial infarction: Secondary | ICD-10-CM | POA: Diagnosis not present

## 2022-12-27 LAB — CMP (CANCER CENTER ONLY)
ALT: 14 U/L (ref 0–44)
AST: 18 U/L (ref 15–41)
Albumin: 4.4 g/dL (ref 3.5–5.0)
Alkaline Phosphatase: 52 U/L (ref 38–126)
Anion gap: 5 (ref 5–15)
BUN: 15 mg/dL (ref 8–23)
CO2: 31 mmol/L (ref 22–32)
Calcium: 9.8 mg/dL (ref 8.9–10.3)
Chloride: 100 mmol/L (ref 98–111)
Creatinine: 0.47 mg/dL (ref 0.44–1.00)
GFR, Estimated: >60 mL/min (ref >60)
Glucose, Bld: 81 mg/dL (ref 70–99)
Potassium: 4.3 mmol/L (ref 3.5–5.1)
Sodium: 136 mmol/L (ref 135–145)
Total Bilirubin: 0.3 mg/dL (ref 0.3–1.2)
Total Protein: 6.8 g/dL (ref 6.5–8.1)

## 2022-12-27 NOTE — Progress Notes (Signed)
[facility] OFFICE PROGRESS NOTE   Diagnosis: Lung cancer  INTERVAL HISTORY:   Brooke [name] returns as scheduled.  Respiratory status overall stable.  She has a "terrific" appetite.  She is gaining weight.  She continues to note thin stools and left-sided abdominal pain just prior or during a bowel movement.  She is taking MiraLAX, Colace and a fiber supplement.  Objective:  Vital signs in last 24 hours:  Blood pressure (!) 145/89, pulse 80, temperature 97.9 F (36.6 C), temperature source Temporal, resp. rate 18, height 5\' 2"  (1.575 m), weight 84 lb (38.1 kg), SpO2 100%.    Lymphatics: No palpable cervical, supraclavicular or axillary lymph nodes.  Shotty bilateral inguinal nodes. Resp: Lungs clear bilaterally.  No respiratory distress. Cardio: Regular rate and rhythm. GI: Abdomen soft.  Tender with deep palpation at the left lower quadrant.  No mass.  No hepatosplenomegaly. Vascular: No leg edema.   Lab Results:  Lab Results  Component Value Date   WBC 12.1 (H) 11/15/2022   HGB 13.0 11/15/2022   HCT 39.4 11/15/2022   MCV 88.3 11/15/2022   PLT 174 11/15/2022   NEUTROABS 11.3 (H) 11/15/2022    Imaging:  No results found.  Medications: I have reviewed the patient's current medications.  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% Left lung radiation 11/08/2016 through 11/21/2016 CT chest 12/12/2016-reexpansion of left upper lobe with a decreased left upper lobe mass, unchanged mediastinal adenopathy, progression of a 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, right lower lobe pneumonia Cycle 1 Pembrolizumab 12/14/2016 Cycle 2 Pembrolizumab 01/03/2017 Cycle 3 Pembrolizumab 01/24/2017 Cycle 4 Pembrolizumab 02/12/2017 CT 03/05/2018-decrease in left upper lobe mass, mediastinal adenopathy, and splenic mass Cycle 5 pembrolizumab 03/07/2017 Cycle 6 Pembrolizumab 04/02/2017 Cycle 7 pembrolizumab 04/25/2017 Cycle 8 Pembrolizumab 05/14/2017 Cycle 9 Pembrolizumab 06/06/2017 CT 06/20/2017-mild decrease in left upper lobe mass, mild increase in paramediastinal left upper lobe nodule-radiation change?, decreased splenic metastasis Cycle 10 pembrolizumab 06/25/2017 Cycle 11 pembrolizumab 07/18/2017 Cycle 12 pembrolizumab 08/29/2017 Cycle 13 pembrolizumab 09/17/2017 Cycle 14 Pembrolizumab 10/10/2017 CT chest 10/24/2017- slight enlargement of small mediastinal lymph nodes, increased left upper lobe consolidation, decreased size of spleen lesion Cycle 15 pembrolizumab 10/29/2017 Cycle 16 Pembrolizumab 11/21/2017 Cycle 17 Pembrolizumab 12/11/2017 Cycle 18 pembrolizumab 01/02/2018 Cycle 19 Pembrolizumab 01/21/2018 Cycle 20 Pembrolizumab 02/13/2018 CT chest 02/27/2018- decreased left paratracheal node, progressive consolidation/fibrosis in the left upper lung, decreased size of splenic mass Cycle 21 pembrolizumab 03/04/2018 Cycle 22 Pembrolizumab 03/27/2018  Cycle 23 pembrolizumab 04/15/2018 Cycle 24 pembrolizumab 05/08/2018 Cycle 25 Pembrolizumab 05/27/2018 CT chest 06/16/2018- stable left upper lung radiation fibrosis with no evidence of local tumor recurrence.  Decreased left paratracheal adenopathy.  No new or progressive metastatic disease in the chest.  Gastrohepatic ligament lymphadenopathy stable.  Splenic metastasis decreased.  T5 vertebral compression fracture with associated patchy sclerosis and no discrete osseous lesion, new in the interval. Cycle 26 Pembrolizumab 06/19/2018 Cycle 27 pembrolizumab  07/08/2018 Cycle 28 pembrolizumab 07/31/2018 Cycle 29 pembrolizumab 08/19/2018 Cycle 30 Pembrolizumab 09/11/2018 Cycle 31 pembrolizumab 09/30/2018 Cycle 32 pembrolizumab 10/23/2018 CT chest 10/28/2018- left apical pleural-parenchymal opacity consistent with radiation scarring has become more confluent likely representing evolutionary change.  Continued further decrease  in size of index left paratracheal node and splenic metastasis.  Gastrohepatic ligament lymph node not changed. Cycle 33 Pembrolizumab 11/11/2018 Cycle 34 pembrolizumab 12/04/2018 Cycle 35 pembrolizumab 12/23/2018 CT chest 02/12/2019- posttreatment changes left upper lobe unchanged.  Continued decrease in size of splenic metastasis.  Stable appearance of left paratracheal lymph nodes.  Stable gastrohepatic adenopathy.  Incomplete visualization of retroperitoneal lymph nodes in the upper abdomen. CT chest 07/02/2019-stable post treatment related changes left lung.  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  Slight regression of enlarged likely metastatic gastrohepatic lymph node. CT chest 11/11/2019-interval enlargement of a right paratracheal lymph node measuring 14 mm, increased from 5 mm.  No new lung mass or nodularity.  New adenopathy in the upper abdomen.  2.7 cm lesion gastrohepatic ligament.  Necrotic node at the splenic hilum measures 4.7 cm.  Large node along the IVC left upper quadrant measures 2.7 cm.  Similar node left adjacent the aorta measures 1.8 cm.  Enhancing lesion in the spleen is unchanged.  Several low-density lesions in the liver are unchanged. Pembrolizumab resumed on a 3-week schedule 12/03/2019 CT chest 02/22/2020-interval resolution of previously demonstrated left paratracheal and upper abdominal adenopathy.  Stable treated metastasis within the spleen.  Stable radiation changes in the left lung with left hilar distortion.  No evidence of local recurrence or progressive metastatic disease. Continuation of pembrolizumab every 3  weeks 02/26/2020 CT chest 06/28/2020-no change in left upper lobe consolidation, unchanged spleen metastasis, no evidence of progressive disease Pembrolizumab continued every 3 weeks 07/01/2020 CT chest 11/02/2020-stable posttreatment changes at the left hilum and left upper lobe, decreased size of spleen lesion, no evidence of disease progression Pembrolizumab every 3 weeks continued CT chest 01/12/2021-negative for acute pulmonary embolus.  Stable radiation fibrosis left upper lobe.  Stable low-density lesion in the spleen.  No new or progressive findings. 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  Splenic lesion stable.  No upper abdominal lymphadenopathy. Pembrolizumab every 3 weeks continued CT chest 08/29/2021-unchanged consolidation/fibrosis at the left upper lung, no evidence of metastatic disease in the chest, COPD, multiple compression fractures, new T11 compression fracture, stable spleen lesion by my review Pembrolizumab every 3 weeks continued Treatment held 10/13/2021 for diarrhea, C. difficile negative Pembrolizumab 11/10/2021 Pembrolizumab 12/01/2021 CTs 12/07/2021-similar treatment effect within the left upper lobe including volume loss, traction bronchiectasis and consolidation.  No local recurrent or active metastatic disease.  Diffuse colitis most significant within the cecum.  New trace left pleural fluid. CTs 03/11/2022-stable radiation fibrosis in the upper left lung, no evidence of local tumor recurrence, no evidence of metastatic disease, normal large bowel without wall thickening CTs 07/10/2022-stable volume loss in the left hemithorax, stable small right middle lobe nodules, stable complex cystic/calcified lesion in the spleen-slightly more dense,  persistent isolated biliary dilation in segment 5 colonic stool, stable thoracic compression fractures     2.  05/29/2019 laryngoscopy-exophytic appearing mass mid right vocal cord.   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  Tumor cells negative for p16, CMV and HSV 2.  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 Radiation 07/29/2019-08/25/2019   3. History of acute respiratory failure secondary to #1   4. Oxygen dependent COPD   5. History of a NSTEMI 2015   6. Right lung pneumonia on chest  CT 12/12/2016-treated with Levaquin   7.  Grade 2 rash possibly related to Pembrolizumab.  Treatment held 08/06/2017.  Improved 08/29/2017, treatment resumed, rash has resolved   8.  Vertigo January 2022-improved with course of Augmentin for ear/sinus infection   9.  Diarrhea beginning early June 2023; negative C. difficile 10/13/2021; CTs 12/07/2021-diffuse colitis most significant within the cecum. -persistent left abdominal pain and constipation, Virtual colonosocopy 11/15/2022- no mass, stricture, or polyp   10.  Isolated segment 5 bile duct dilation    Disposition: Brooke [name] remains in clinical remission from lung cancer.  Plan for restaging chest CT in the next 4 to 6 weeks.  She continues to have abdominal pain and change in stool caliber.  She would like another opinion.  We made a referral to Dr. Elnoria Howard.  She will return for follow-up a week or so after the chest CT.    Lonna Cobb ANP/GNP-BC   12/27/2022  9:44 AM

## 2023-01-03 ENCOUNTER — Encounter: Payer: Self-pay | Admitting: *Deleted

## 2023-01-03 NOTE — Progress Notes (Signed)
PATIENT NAVIGATOR PROGRESS NOTE  Name: Brooke [name] Date: 01/03/2023 MRN: [id number]  DOB: 1945-07-14   Reason for visit:  Referral faxed to Dr Haywood Pao office  Comments:  Called to F/U on referral entered and Dr Haywood Pao office has not received it. New referral faxed to [phone]    Time spent counseling/coordinating care: 30-45 minutes

## 2023-01-08 DIAGNOSIS — F418 Other specified anxiety disorders: Secondary | ICD-10-CM | POA: Diagnosis not present

## 2023-01-08 DIAGNOSIS — Z681 Body mass index (BMI) 19 or less, adult: Secondary | ICD-10-CM | POA: Diagnosis not present

## 2023-01-08 DIAGNOSIS — J9611 Chronic respiratory failure with hypoxia: Secondary | ICD-10-CM | POA: Diagnosis not present

## 2023-01-08 DIAGNOSIS — I10 Essential (primary) hypertension: Secondary | ICD-10-CM | POA: Diagnosis not present

## 2023-01-08 DIAGNOSIS — Z0001 Encounter for general adult medical examination with abnormal findings: Secondary | ICD-10-CM | POA: Diagnosis not present

## 2023-01-08 DIAGNOSIS — Z0000 Encounter for general adult medical examination without abnormal findings: Secondary | ICD-10-CM | POA: Diagnosis not present

## 2023-01-08 DIAGNOSIS — J431 Panlobular emphysema: Secondary | ICD-10-CM | POA: Diagnosis not present

## 2023-01-12 DIAGNOSIS — J449 Chronic obstructive pulmonary disease, unspecified: Secondary | ICD-10-CM | POA: Diagnosis not present

## 2023-01-16 ENCOUNTER — Telehealth: Payer: Self-pay | Admitting: *Deleted

## 2023-01-16 NOTE — Telephone Encounter (Signed)
Referral coordinator from [facility] called to report Dr. Elnoria Howard said he could not see her and she needs to stay with [facility]. He did not give a reason.

## 2023-01-28 ENCOUNTER — Other Ambulatory Visit: Payer: Medicare Other

## 2023-01-31 ENCOUNTER — Ambulatory Visit (HOSPITAL_BASED_OUTPATIENT_CLINIC_OR_DEPARTMENT_OTHER)
Admission: RE | Admit: 2023-01-31 | Discharge: 2023-01-31 | Disposition: A | Discharge status: Home / Self Care | Payer: Medicare Other | Source: Ambulatory Visit | Attending: Nurse Practitioner | Admitting: Nurse Practitioner

## 2023-01-31 ENCOUNTER — Inpatient Hospital Stay: Payer: Medicare Other | Attending: Oncology

## 2023-01-31 DIAGNOSIS — Z9221 Personal history of antineoplastic chemotherapy: Secondary | ICD-10-CM | POA: Diagnosis not present

## 2023-01-31 DIAGNOSIS — Z923 Personal history of irradiation: Secondary | ICD-10-CM | POA: Insufficient documentation

## 2023-01-31 DIAGNOSIS — Z85118 Personal history of other malignant neoplasm of bronchus and lung: Secondary | ICD-10-CM | POA: Insufficient documentation

## 2023-01-31 DIAGNOSIS — J439 Emphysema, unspecified: Secondary | ICD-10-CM | POA: Insufficient documentation

## 2023-01-31 DIAGNOSIS — C3490 Malignant neoplasm of unspecified part of unspecified bronchus or lung: Secondary | ICD-10-CM | POA: Diagnosis not present

## 2023-01-31 DIAGNOSIS — J432 Centrilobular emphysema: Secondary | ICD-10-CM | POA: Diagnosis not present

## 2023-01-31 DIAGNOSIS — Z9981 Dependence on supplemental oxygen: Secondary | ICD-10-CM | POA: Diagnosis not present

## 2023-01-31 DIAGNOSIS — I700 Atherosclerosis of aorta: Secondary | ICD-10-CM | POA: Insufficient documentation

## 2023-01-31 DIAGNOSIS — I2510 Atherosclerotic heart disease of native coronary artery without angina pectoris: Secondary | ICD-10-CM | POA: Diagnosis not present

## 2023-01-31 DIAGNOSIS — C3412 Malignant neoplasm of upper lobe, left bronchus or lung: Secondary | ICD-10-CM

## 2023-01-31 LAB — BASIC METABOLIC PANEL - CANCER CENTER ONLY
Anion gap: 6 (ref 5–15)
BUN: 13 mg/dL (ref 8–23)
CO2: 32 mmol/L (ref 22–32)
Calcium: 9.7 mg/dL (ref 8.9–10.3)
Chloride: 100 mmol/L (ref 98–111)
Creatinine: 0.61 mg/dL (ref 0.44–1.00)
GFR, Estimated: >60 mL/min (ref >60)
Glucose, Bld: 87 mg/dL (ref 70–99)
Potassium: 3.9 mmol/L (ref 3.5–5.1)
Sodium: 138 mmol/L (ref 135–145)

## 2023-01-31 MED ORDER — IOHEXOL 300 MG/ML  SOLN
100.0000 mL | Freq: Once | INTRAMUSCULAR | Status: AC | PRN
  Administered 2023-01-31: 50 mL via INTRAVENOUS

## 2023-02-05 ENCOUNTER — Ambulatory Visit: Payer: Medicare Other | Admitting: Oncology

## 2023-02-07 ENCOUNTER — Inpatient Hospital Stay: Payer: Medicare Other | Attending: Oncology | Admitting: Nurse Practitioner

## 2023-02-07 ENCOUNTER — Encounter: Payer: Self-pay | Admitting: Nurse Practitioner

## 2023-02-07 VITALS — BP 158/68 | HR 78 | Temp 97.7°F | Resp 18 | Ht 62.0 in | Wt 86.3 lb

## 2023-02-07 DIAGNOSIS — Z9221 Personal history of antineoplastic chemotherapy: Secondary | ICD-10-CM | POA: Diagnosis not present

## 2023-02-07 DIAGNOSIS — M4854XA Collapsed vertebra, not elsewhere classified, thoracic region, initial encounter for fracture: Secondary | ICD-10-CM | POA: Insufficient documentation

## 2023-02-07 DIAGNOSIS — Z85118 Personal history of other malignant neoplasm of bronchus and lung: Secondary | ICD-10-CM | POA: Diagnosis not present

## 2023-02-07 DIAGNOSIS — K5900 Constipation, unspecified: Secondary | ICD-10-CM | POA: Insufficient documentation

## 2023-02-07 DIAGNOSIS — Z79899 Other long term (current) drug therapy: Secondary | ICD-10-CM | POA: Insufficient documentation

## 2023-02-07 DIAGNOSIS — Z923 Personal history of irradiation: Secondary | ICD-10-CM | POA: Diagnosis not present

## 2023-02-07 DIAGNOSIS — I252 Old myocardial infarction: Secondary | ICD-10-CM | POA: Diagnosis not present

## 2023-02-07 DIAGNOSIS — Z9981 Dependence on supplemental oxygen: Secondary | ICD-10-CM | POA: Insufficient documentation

## 2023-02-07 DIAGNOSIS — K529 Noninfective gastroenteritis and colitis, unspecified: Secondary | ICD-10-CM | POA: Diagnosis not present

## 2023-02-07 DIAGNOSIS — C3412 Malignant neoplasm of upper lobe, left bronchus or lung: Secondary | ICD-10-CM | POA: Diagnosis not present

## 2023-02-07 DIAGNOSIS — R635 Abnormal weight gain: Secondary | ICD-10-CM | POA: Diagnosis not present

## 2023-02-07 NOTE — Progress Notes (Signed)
[facility] OFFICE PROGRESS NOTE   Diagnosis: Lung cancer  INTERVAL HISTORY:   Brooke [name] returns as scheduled.  She has discontinued the stool softener and laxative and adjusted her diet.  She notes improvement in bowel habits.  She is gaining weight.  Stool diameter has increased.  She feels her breathing has improved since working with respiratory therapy.  She continues supplemental oxygen at 1-1/2 to 2 L.  She is able to go periods of time without supplemental oxygen.  Objective:  Vital signs in last 24 hours:  Blood pressure (!) 161/90, pulse 78, temperature 97.7 F (36.5 C), temperature source Oral, resp. rate 18, height 5\' 2"  (1.575 m), weight 86 lb 4.8 oz (39.1 kg), SpO2 99%.    Lymphatics: No palpable cervical, supraclavicular or axillary lymph nodes. Resp: Distant breath sounds.  No respiratory distress. Cardio: Regular rate and rhythm. GI: No hepatosplenomegaly. Vascular: No leg edema.  Lab Results:  Lab Results  Component Value Date   WBC 12.1 (H) 11/15/2022   HGB 13.0 11/15/2022   HCT 39.4 11/15/2022   MCV 88.3 11/15/2022   PLT 174 11/15/2022   NEUTROABS 11.3 (H) 11/15/2022    Imaging:  No results found.  Medications: I have reviewed the patient's current medications.  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% Left lung radiation 11/08/2016 through 11/21/2016 CT chest 12/12/2016-reexpansion of left upper lobe with a decreased left upper lobe mass, unchanged mediastinal adenopathy, progression of a 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, right lower lobe pneumonia Cycle 1 Pembrolizumab 12/14/2016 Cycle 2 Pembrolizumab 01/03/2017 Cycle 3 Pembrolizumab 01/24/2017 Cycle 4 Pembrolizumab 02/12/2017 CT 03/05/2018-decrease in left upper lobe mass, mediastinal adenopathy, and splenic mass Cycle 5 pembrolizumab 03/07/2017 Cycle 6 Pembrolizumab 04/02/2017 Cycle 7 pembrolizumab 04/25/2017 Cycle 8 Pembrolizumab 05/14/2017 Cycle 9 Pembrolizumab 06/06/2017 CT 06/20/2017-mild decrease in left upper lobe mass, mild increase in paramediastinal left upper lobe nodule-radiation change?, decreased splenic metastasis Cycle 10 pembrolizumab 06/25/2017 Cycle 11 pembrolizumab 07/18/2017 Cycle 12 pembrolizumab 08/29/2017 Cycle 13 pembrolizumab 09/17/2017 Cycle 14 Pembrolizumab 10/10/2017 CT chest 10/24/2017- slight enlargement of small mediastinal lymph nodes, increased left upper lobe consolidation, decreased size of spleen lesion Cycle 15 pembrolizumab 10/29/2017 Cycle 16 Pembrolizumab 11/21/2017 Cycle 17 Pembrolizumab 12/11/2017 Cycle 18 pembrolizumab 01/02/2018 Cycle 19 Pembrolizumab 01/21/2018 Cycle 20 Pembrolizumab 02/13/2018 CT chest 02/27/2018- decreased left paratracheal node, progressive consolidation/fibrosis in the left upper lung, decreased size of splenic mass Cycle 21 pembrolizumab 03/04/2018 Cycle 22 Pembrolizumab 03/27/2018  Cycle 23 pembrolizumab 04/15/2018 Cycle 24 pembrolizumab 05/08/2018 Cycle 25 Pembrolizumab 05/27/2018 CT chest 06/16/2018- stable left upper lung radiation fibrosis with no evidence of local tumor recurrence.  Decreased left paratracheal adenopathy.  No new or progressive metastatic disease in the chest.  Gastrohepatic ligament lymphadenopathy stable.  Splenic metastasis decreased.  T5 vertebral compression fracture with associated patchy sclerosis and no discrete osseous lesion, new in the interval. Cycle 26 Pembrolizumab 06/19/2018 Cycle 27 pembrolizumab  07/08/2018 Cycle 28 pembrolizumab 07/31/2018 Cycle 29 pembrolizumab 08/19/2018 Cycle 30 Pembrolizumab 09/11/2018 Cycle 31 pembrolizumab 09/30/2018 Cycle 32 pembrolizumab 10/23/2018 CT chest 10/28/2018- left apical pleural-parenchymal opacity consistent with radiation scarring has become more confluent likely representing evolutionary change.  Continued further decrease  in size of index left paratracheal node and splenic metastasis.  Gastrohepatic ligament lymph node not changed. Cycle 33 Pembrolizumab 11/11/2018 Cycle 34 pembrolizumab 12/04/2018 Cycle 35 pembrolizumab 12/23/2018 CT chest 02/12/2019- posttreatment changes left upper lobe unchanged.  Continued decrease in size of splenic metastasis.  Stable appearance of left paratracheal lymph nodes.  Stable gastrohepatic adenopathy.  Incomplete visualization of retroperitoneal lymph nodes in the upper abdomen. CT chest 07/02/2019-stable post treatment related changes left lung.  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  Slight regression of enlarged likely metastatic gastrohepatic lymph node. CT chest 11/11/2019-interval enlargement of a right paratracheal lymph node measuring 14 mm, increased from 5 mm.  No new lung mass or nodularity.  New adenopathy in the upper abdomen.  2.7 cm lesion gastrohepatic ligament.  Necrotic node at the splenic hilum measures 4.7 cm.  Large node along the IVC left upper quadrant measures 2.7 cm.  Similar node left adjacent the aorta measures 1.8 cm.  Enhancing lesion in the spleen is unchanged.  Several low-density lesions in the liver are unchanged. Pembrolizumab resumed on a 3-week schedule 12/03/2019 CT chest 02/22/2020-interval resolution of previously demonstrated left paratracheal and upper abdominal adenopathy.  Stable treated metastasis within the spleen.  Stable radiation changes in the left lung with left hilar distortion.  No evidence of local recurrence or progressive metastatic disease. Continuation of pembrolizumab every 3  weeks 02/26/2020 CT chest 06/28/2020-no change in left upper lobe consolidation, unchanged spleen metastasis, no evidence of progressive disease Pembrolizumab continued every 3 weeks 07/01/2020 CT chest 11/02/2020-stable posttreatment changes at the left hilum and left upper lobe, decreased size of spleen lesion, no evidence of disease progression Pembrolizumab every 3 weeks continued CT chest 01/12/2021-negative for acute pulmonary embolus.  Stable radiation fibrosis left upper lobe.  Stable low-density lesion in the spleen.  No new or progressive findings. 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  Splenic lesion stable.  No upper abdominal lymphadenopathy. Pembrolizumab every 3 weeks continued CT chest 08/29/2021-unchanged consolidation/fibrosis at the left upper lung, no evidence of metastatic disease in the chest, COPD, multiple compression fractures, new T11 compression fracture, stable spleen lesion by my review Pembrolizumab every 3 weeks continued Treatment held 10/13/2021 for diarrhea, C. difficile negative Pembrolizumab 11/10/2021 Pembrolizumab 12/01/2021 CTs 12/07/2021-similar treatment effect within the left upper lobe including volume loss, traction bronchiectasis and consolidation.  No local recurrent or active metastatic disease.  Diffuse colitis most significant within the cecum.  New trace left pleural fluid. CTs 03/11/2022-stable radiation fibrosis in the upper left lung, no evidence of local tumor recurrence, no evidence of metastatic disease, normal large bowel without wall thickening CTs 07/10/2022-stable volume loss in the left hemithorax, stable small right middle lobe nodules, stable complex cystic/calcified lesion in the spleen-slightly more dense,  persistent isolated biliary dilation in segment 5 colonic stool, stable thoracic compression fractures CTs 01/31/2023-posttreatment scarring left upper lobe.  No evidence of recurrent or metastatic disease.     2.  05/29/2019 laryngoscopy-exophytic appearing mass mid right vocal cord.   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  Tumor cells negative for p16, CMV and HSV 2.  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 Radiation 07/29/2019-08/25/2019   3. History of acute respiratory failure secondary to #1   4. Oxygen dependent COPD  5. History of a NSTEMI 2015   6. Right lung pneumonia on chest CT 12/12/2016-treated with Levaquin   7.  Grade 2 rash possibly related to Pembrolizumab.  Treatment held 08/06/2017.  Improved 08/29/2017, treatment resumed, rash has resolved   8.  Vertigo January 2022-improved with course of Augmentin for ear/sinus infection   9.  Diarrhea beginning early June 2023; negative C. difficile 10/13/2021; CTs 12/07/2021-diffuse colitis most significant within the cecum. -persistent left abdominal pain and constipation, Virtual colonosocopy 11/15/2022- no mass, stricture, or polyp   10.  Isolated segment 5 bile duct dilation    Disposition: Brooke [name] appears stable.  She remains in clinical remission from lung cancer.  Recent restaging chest CT shows no evidence of recurrent or metastatic disease.  Plan to continue to follow with observation.  Bowel habits and stool diameter have improved.    She will return for follow-up in approximately 8 weeks.  She will contact the office in the interim with any problems.  Lonna Cobb ANP/GNP-BC   02/07/2023  10:15 AM

## 2023-02-11 DIAGNOSIS — J449 Chronic obstructive pulmonary disease, unspecified: Secondary | ICD-10-CM | POA: Diagnosis not present

## 2023-02-12 DIAGNOSIS — J432 Centrilobular emphysema: Secondary | ICD-10-CM | POA: Diagnosis not present

## 2023-02-12 DIAGNOSIS — Z9981 Dependence on supplemental oxygen: Secondary | ICD-10-CM | POA: Diagnosis not present

## 2023-02-12 DIAGNOSIS — Z741 Need for assistance with personal care: Secondary | ICD-10-CM | POA: Diagnosis not present

## 2023-02-12 DIAGNOSIS — J9611 Chronic respiratory failure with hypoxia: Secondary | ICD-10-CM | POA: Diagnosis not present

## 2023-03-14 DIAGNOSIS — J449 Chronic obstructive pulmonary disease, unspecified: Secondary | ICD-10-CM | POA: Diagnosis not present

## 2023-03-18 ENCOUNTER — Telehealth: Payer: Self-pay | Admitting: *Deleted

## 2023-03-18 NOTE — Telephone Encounter (Signed)
CALLED PATIENT TO ASK ABOUT RESCHEDULING FU ON 04-05-23, DUE TO BEING OFF FOR THE HOLIDAY, SPOKE WITH PATIENT AND SHE AGREED TO COME ON 04-12-23 @ 10:20 AM

## 2023-04-05 ENCOUNTER — Ambulatory Visit: Payer: Self-pay | Admitting: Radiation Oncology

## 2023-04-11 ENCOUNTER — Inpatient Hospital Stay: Payer: Medicare Other | Attending: Oncology | Admitting: Oncology

## 2023-04-11 VITALS — BP 158/95 | HR 85 | Temp 97.9°F | Resp 18 | Ht 62.0 in | Wt 88.3 lb

## 2023-04-11 DIAGNOSIS — Z9221 Personal history of antineoplastic chemotherapy: Secondary | ICD-10-CM | POA: Insufficient documentation

## 2023-04-11 DIAGNOSIS — M4854XA Collapsed vertebra, not elsewhere classified, thoracic region, initial encounter for fracture: Secondary | ICD-10-CM | POA: Diagnosis not present

## 2023-04-11 DIAGNOSIS — Z9981 Dependence on supplemental oxygen: Secondary | ICD-10-CM | POA: Diagnosis not present

## 2023-04-11 DIAGNOSIS — J440 Chronic obstructive pulmonary disease with acute lower respiratory infection: Secondary | ICD-10-CM | POA: Insufficient documentation

## 2023-04-11 DIAGNOSIS — Z79899 Other long term (current) drug therapy: Secondary | ICD-10-CM | POA: Diagnosis not present

## 2023-04-11 DIAGNOSIS — K529 Noninfective gastroenteritis and colitis, unspecified: Secondary | ICD-10-CM | POA: Diagnosis not present

## 2023-04-11 DIAGNOSIS — Z923 Personal history of irradiation: Secondary | ICD-10-CM | POA: Insufficient documentation

## 2023-04-11 DIAGNOSIS — K5900 Constipation, unspecified: Secondary | ICD-10-CM | POA: Insufficient documentation

## 2023-04-11 DIAGNOSIS — J189 Pneumonia, unspecified organism: Secondary | ICD-10-CM | POA: Diagnosis not present

## 2023-04-11 DIAGNOSIS — Z85118 Personal history of other malignant neoplasm of bronchus and lung: Secondary | ICD-10-CM | POA: Diagnosis not present

## 2023-04-11 DIAGNOSIS — C3412 Malignant neoplasm of upper lobe, left bronchus or lung: Secondary | ICD-10-CM

## 2023-04-11 DIAGNOSIS — I252 Old myocardial infarction: Secondary | ICD-10-CM | POA: Insufficient documentation

## 2023-04-11 NOTE — Progress Notes (Signed)
[facility] OFFICE PROGRESS NOTE   Diagnosis: Non-small cell cancer  INTERVAL HISTORY:   Ms. Jochim returns as scheduled.  She feels well.  Good appetite.  She reports the constipation has improved.  She has changed her diet.  He is now eating more fresh vegetables.  Dyspnea is improved.  She is gaining weight.  Objective:  Vital signs in last 24 hours:  Blood pressure (!) 158/95, pulse 85, temperature 97.9 F (36.6 C), temperature source Temporal, resp. rate 18, height 5\' 2"  (1.575 m), weight 88 lb 4.8 oz (40.1 kg), SpO2 100%.   Lymphatics: No cervical, supraclavicular, axillary, or inguinal nodes Resp: Distant breath sounds, no respiratory distress Cardio: Regular rate and rhythm GI: Nontender, no hepatosplenomegaly, no mass Vascular: No leg edema  Lab Results:  Lab Results  Component Value Date   WBC 12.1 (H) 11/15/2022   HGB 13.0 11/15/2022   HCT 39.4 11/15/2022   MCV 88.3 11/15/2022   PLT 174 11/15/2022   NEUTROABS 11.3 (H) 11/15/2022    CMP  Lab Results  Component Value Date   NA 138 01/31/2023   K 3.9 01/31/2023   CL 100 01/31/2023   CO2 32 01/31/2023   GLUCOSE 87 01/31/2023   BUN 13 01/31/2023   CREATININE 0.61 01/31/2023   CALCIUM 9.7 01/31/2023   PROT 6.8 12/27/2022   ALBUMIN 4.4 12/27/2022   AST 18 12/27/2022   ALT 14 12/27/2022   ALKPHOS 52 12/27/2022   BILITOT 0.3 12/27/2022   GFRNONAA >60 01/31/2023   GFRAA >60 02/05/2020     Medications: I have reviewed the patient's current medications.   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% Left lung radiation 11/08/2016 through 11/21/2016 CT chest 12/12/2016-reexpansion of left upper lobe with a decreased left upper lobe mass, unchanged mediastinal adenopathy, progression of a splenic metastasis/pancreatic tail/gastrohepatic ligament metastasis, right lower lobe pneumonia Cycle 1 Pembrolizumab 12/14/2016 Cycle 2 Pembrolizumab 01/03/2017 Cycle 3 Pembrolizumab 01/24/2017 Cycle 4 Pembrolizumab 02/12/2017 CT 03/05/2018-decrease in left upper lobe mass, mediastinal adenopathy, and splenic mass Cycle 5 pembrolizumab 03/07/2017 Cycle 6 Pembrolizumab 04/02/2017 Cycle 7 pembrolizumab 04/25/2017 Cycle 8 Pembrolizumab 05/14/2017 Cycle 9 Pembrolizumab 06/06/2017 CT 06/20/2017-mild decrease in left upper lobe mass, mild increase in paramediastinal left upper lobe nodule-radiation change?, decreased splenic metastasis Cycle 10 pembrolizumab 06/25/2017 Cycle 11 pembrolizumab 07/18/2017 Cycle 12 pembrolizumab 08/29/2017 Cycle 13 pembrolizumab 09/17/2017 Cycle 14 Pembrolizumab 10/10/2017 CT chest 10/24/2017- slight enlargement of small mediastinal lymph nodes, increased left upper lobe consolidation, decreased size of spleen lesion Cycle 15 pembrolizumab 10/29/2017 Cycle 16 Pembrolizumab 11/21/2017 Cycle 17 Pembrolizumab 12/11/2017 Cycle 18 pembrolizumab 01/02/2018 Cycle 19 Pembrolizumab 01/21/2018 Cycle 20 Pembrolizumab 02/13/2018 CT chest 02/27/2018- decreased left paratracheal node, progressive consolidation/fibrosis in the left upper lung, decreased size of splenic mass Cycle 21 pembrolizumab 03/04/2018 Cycle 22 Pembrolizumab 03/27/2018  Cycle 23 pembrolizumab 04/15/2018 Cycle 24 pembrolizumab 05/08/2018 Cycle 25 Pembrolizumab 05/27/2018 CT chest 06/16/2018- stable left upper lung radiation fibrosis with no evidence of local tumor recurrence.  Decreased left paratracheal adenopathy.  No new or progressive metastatic disease in the chest.  Gastrohepatic ligament  lymphadenopathy stable.  Splenic metastasis decreased.  T5 vertebral compression fracture with associated patchy  sclerosis and no discrete osseous lesion, new in the interval. Cycle 26 Pembrolizumab 06/19/2018 Cycle 27 pembrolizumab 07/08/2018 Cycle 28 pembrolizumab 07/31/2018 Cycle 29 pembrolizumab 08/19/2018 Cycle 30 Pembrolizumab 09/11/2018 Cycle 31 pembrolizumab 09/30/2018 Cycle 32 pembrolizumab 10/23/2018 CT chest 10/28/2018- left apical pleural-parenchymal opacity consistent with radiation scarring has become more confluent likely representing evolutionary change.  Continued further decrease in size of index left paratracheal node and splenic metastasis.  Gastrohepatic ligament lymph node not changed. Cycle 33 Pembrolizumab 11/11/2018 Cycle 34 pembrolizumab 12/04/2018 Cycle 35 pembrolizumab 12/23/2018 CT chest 02/12/2019- posttreatment changes left upper lobe unchanged.  Continued decrease in size of splenic metastasis.  Stable appearance of left paratracheal lymph nodes.  Stable gastrohepatic adenopathy.  Incomplete visualization of retroperitoneal lymph nodes in the upper abdomen. CT chest 07/02/2019-stable post treatment related changes left lung.  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  Slight regression of enlarged likely metastatic gastrohepatic lymph node. CT chest 11/11/2019-interval enlargement of a right paratracheal lymph node measuring 14 mm, increased from 5 mm.  No new lung mass or nodularity.  New adenopathy in the upper abdomen.  2.7 cm lesion gastrohepatic ligament.  Necrotic node at the splenic hilum measures 4.7 cm.  Large node along the IVC left upper quadrant measures 2.7 cm.  Similar node left adjacent the aorta measures 1.8 cm.  Enhancing lesion in the spleen is unchanged.  Several low-density lesions in the liver are unchanged. Pembrolizumab resumed on a 3-week schedule 12/03/2019 CT chest 02/22/2020-interval resolution of previously demonstrated left paratracheal and upper abdominal  adenopathy.  Stable treated metastasis within the spleen.  Stable radiation changes in the left lung with left hilar distortion.  No evidence of local recurrence or progressive metastatic disease. Continuation of pembrolizumab every 3 weeks 02/26/2020 CT chest 06/28/2020-no change in left upper lobe consolidation, unchanged spleen metastasis, no evidence of progressive disease Pembrolizumab continued every 3 weeks 07/01/2020 CT chest 11/02/2020-stable posttreatment changes at the left hilum and left upper lobe, decreased size of spleen lesion, no evidence of disease progression Pembrolizumab every 3 weeks continued CT chest 01/12/2021-negative for acute pulmonary embolus.  Stable radiation fibrosis left upper lobe.  Stable low-density lesion in the spleen.  No new or progressive findings. 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  Splenic lesion stable.  No upper abdominal lymphadenopathy. Pembrolizumab every 3 weeks continued CT chest 08/29/2021-unchanged consolidation/fibrosis at the left upper lung, no evidence of metastatic disease in the chest, COPD, multiple compression fractures, new T11 compression fracture, stable spleen lesion by my review Pembrolizumab every 3 weeks continued Treatment held 10/13/2021 for diarrhea, C. difficile negative Pembrolizumab 11/10/2021 Pembrolizumab 12/01/2021 CTs 12/07/2021-similar treatment effect within the left upper lobe including volume loss, traction bronchiectasis and consolidation.  No local recurrent or active metastatic disease.  Diffuse colitis most significant within the cecum.  New trace left pleural fluid. CTs 03/11/2022-stable radiation fibrosis in the upper left lung, no evidence of local tumor recurrence, no evidence of  metastatic disease, normal large bowel without wall thickening CTs 07/10/2022-stable volume loss in the left hemithorax, stable small right middle lobe nodules, stable complex cystic/calcified lesion in the spleen-slightly more dense, persistent isolated biliary dilation in segment 5 colonic stool, stable thoracic compression fractures CTs 01/31/2023-posttreatment scarring left upper lobe.  No evidence of recurrent or metastatic disease.     2.  05/29/2019 laryngoscopy-exophytic appearing mass mid right vocal cord.   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  Tumor cells negative for p16, CMV and HSV 2.  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 Radiation 07/29/2019-08/25/2019   3. History of acute respiratory failure secondary to #1   4. Oxygen dependent COPD   5. History of a NSTEMI 2015   6. Right lung pneumonia on chest CT 12/12/2016-treated with Levaquin   7.  Grade 2 rash possibly related to Pembrolizumab.  Treatment held 08/06/2017.  Improved 08/29/2017, treatment resumed, rash has resolved   8.  Vertigo January 2022-improved with course of Augmentin for ear/sinus infection   9.  Diarrhea beginning early June 2023; negative C. difficile 10/13/2021; CTs 12/07/2021-diffuse colitis most significant within the cecum. -persistent left abdominal pain and constipation, Virtual colonosocopy 11/15/2022- no mass, stricture, or polyp   10.  Isolated segment 5 bile duct dilation      Disposition: Mr. Curti remains in clinical remission from lung cancer.  GI symptoms she has experienced over the past 2 years have resolved.  The plan is to continue observation.  She will return for an office visit in 3 months.  Thornton Papas, MD  04/11/2023  10:13 AM

## 2023-04-12 ENCOUNTER — Ambulatory Visit
Admission: RE | Admit: 2023-04-12 | Discharge: 2023-04-12 | Disposition: A | Discharge status: Home / Self Care | Payer: Medicare Other | Source: Ambulatory Visit | Attending: Radiation Oncology | Admitting: Radiation Oncology

## 2023-04-12 VITALS — BP 176/94 | HR 83 | Temp 97.5°F | Resp 20 | Ht 62.0 in | Wt 88.4 lb

## 2023-04-12 DIAGNOSIS — Z79899 Other long term (current) drug therapy: Secondary | ICD-10-CM | POA: Diagnosis not present

## 2023-04-12 DIAGNOSIS — Z85819 Personal history of malignant neoplasm of unspecified site of lip, oral cavity, and pharynx: Secondary | ICD-10-CM | POA: Insufficient documentation

## 2023-04-12 DIAGNOSIS — Z923 Personal history of irradiation: Secondary | ICD-10-CM | POA: Diagnosis not present

## 2023-04-12 DIAGNOSIS — C320 Malignant neoplasm of glottis: Secondary | ICD-10-CM

## 2023-04-12 DIAGNOSIS — Z1329 Encounter for screening for other suspected endocrine disorder: Secondary | ICD-10-CM | POA: Diagnosis not present

## 2023-04-12 DIAGNOSIS — Z85118 Personal history of other malignant neoplasm of bronchus and lung: Secondary | ICD-10-CM | POA: Insufficient documentation

## 2023-04-12 DIAGNOSIS — C3412 Malignant neoplasm of upper lobe, left bronchus or lung: Secondary | ICD-10-CM

## 2023-04-12 DIAGNOSIS — Z7951 Long term (current) use of inhaled steroids: Secondary | ICD-10-CM | POA: Diagnosis not present

## 2023-04-12 DIAGNOSIS — Z87891 Personal history of nicotine dependence: Secondary | ICD-10-CM | POA: Diagnosis not present

## 2023-04-12 LAB — TSH: TSH: 1.06 u[IU]/mL (ref 0.350–4.500)

## 2023-04-12 MED ORDER — SONAFINE EX EMUL
1.0000 | Freq: Two times a day (BID) | CUTANEOUS | Status: DC
  Administered 2023-04-12: 1 via TOPICAL

## 2023-04-12 MED ORDER — OXYMETAZOLINE HCL 0.05 % NA SOLN
2.0000 | Freq: Once | NASAL | Status: DC
  Filled 2023-04-12: qty 30

## 2023-04-12 NOTE — Progress Notes (Signed)
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 presents for follow-up after completing radiation to the larynx on 08/25/2019 (also completed radiation to her left lung on 11/21/2016)  Pain issues, if any: Denies Using a feeding tube?: N/A Weight changes, if any:  Wt Readings from Last 3 Encounters:  04/12/23 88 lb 6.4 oz (40.1 kg)  04/11/23 88 lb 4.8 oz (40.1 kg)  02/07/23 86 lb 4.8 oz (39.1 kg)   Swallowing issues, if any: Denies Cough/SOB/O2 use:  Denies any new issues or concerns. Continues to wear supplemental O2 (2L-[place]) Smoking or chewing tobacco? None Using fluoride toothpaste daily? Yes--denies any dental concerns Last ENT visit was on: Not since she saw Dr. Billy Fischer on 08/08/2021 --Procedure: Transnasal Flexible Laryngoscopy  Findings: The nasal cavity and nasopharynx are unremarkable. There are no suspicious findings in the nasopharynx or Fossa of Rosenmller. The tongue base, pharyngeal walls, piriform sinuses, vallecula, epiglottis and postcricoid region are normal in appearance. The visualized portion of the subglottis and proximal trachea is widely patent. The right cord is mobile. Bilateral age-appropriate vocal fold atrophy with persistent ellipse shaped gap throughout all  phases of phonation. The left true cord and arytenoid do demonstrate some hypomobility. There is no evidence of any disease today. There are no lesions or significant inflammation. There is no glottal insufficiency. There is no pooling of secretions or aspiration.  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 The left vocal fold did demonstrate continued hypomobility. I will plan on seeing her back every 6 months, as I will alternate with Dr. Basilio Cairo (Radiation Oncology)  Other notable issues, if any: Reports she had issues with pencil-like BMs several months ago, but this seems to have resolved  Saw medical oncologist Dr. Truett Perna yesterday 04/11/2023 --Disposition: Mr. Trisdale remains in clinical remission from lung cancer.  GI symptoms she has experienced over the past 2 years have resolved. The plan is to continue observation.  She will return for an office visit in 3 months.   ALLERGIES:  is allergic to afrin [oxymetazoline], pulmicort [budesonide], sulfa antibiotics, nitrofuran derivatives, codeine, imdur [isosorbide dinitrate], and prednisone.  Meds: Current Outpatient Medications  Medication Sig Dispense Refill   acetaminophen (TYLENOL) 325 MG tablet Per bottle as needed     albuterol (VENTOLIN HFA) 108 (90 Base) MCG/ACT inhaler Inhale 2 puffs into the lungs every 4 (four) hours as needed.     ALPRAZolam (XANAX) 0.25 MG tablet Take 0.5-1 tablets by mouth 2 (two) times daily as needed.     amLODipine (NORVASC) 2.5 MG tablet TAKE 1 TABLET IF BLOOD PRESSURE(140-159), 2 TABLETS(160-179), 3 TABLETS FOR BLOOD PRESSURE GREATER THAN 180  180 tablet 3   atorvastatin (LIPITOR) 20 MG tablet TAKE 4 TABLETS(80 MG) BY MOUTH DAILY 360 tablet 1   budesonide-formoterol (SYMBICORT) 160-4.5 MCG/ACT inhaler Inhale 2 puffs into the lungs 2 (two) times daily.     docusate sodium (COLACE) 100 MG capsule Take 100 mg by mouth 2 (two) times  daily. (Patient not taking: Reported on 04/11/2023)     guaiFENesin (MUCINEX) 600 MG 12 hr tablet Take 600 mg by mouth 2 (two) times daily as needed for cough or to loosen phlegm. (Patient not taking: Reported on 11/23/2022)     ipratropium-albuterol (DUONEB) 0.5-2.5 (3) MG/3ML SOLN Take 3 mLs by nebulization every 4 (four) hours as needed (wheezing/shortness of breath). **PLAN C**     OXYGEN 2lpm 24/7     polyethylene glycol (MIRALAX) 17 g packet Take 8.5 g by mouth daily. 1/2 capful (Patient not taking: Reported on 02/07/2023)     Simethicone 180 MG CAPS Use as needed     No current facility-administered medications for this encounter.    Physical Findings: The patient is in no acute distress. Patient is alert and oriented. Wt Readings from Last 3 Encounters:  04/12/23 88 lb 6.4 oz (40.1 kg)  04/11/23 88 lb 4.8 oz (40.1 kg)  02/07/23 86 lb 4.8 oz (39.1 kg)    height is 5\' 2"  (1.575 m) and weight is 88 lb 6.4 oz (40.1 kg). Her temperature is 97.5 F (36.4 C) (abnormal). Her blood pressure is 176/94 (abnormal) and her pulse is 83. Her respiration is 20 and oxygen saturation is 100%. .  General: Alert and oriented, in no acute distress; hoarseness is stable HEENT: Head is normocephalic. Extraocular movements are intact.   No lesions in upper throat/mouth Neck is notable for stable lymphedema in anterior neck, no masses; skin intact Skin: Skin in treatment fields shows satisfactory healing  Psychiatric: Judgment and insight are intact. Affect is appropriate. Heart : RRR Chest:  CTAB/ O2 per [place] Abd: NT ND and soft to palpation EXT:  no edema  PROCEDURE NOTE:  PROCEDURE NOTE: After obtaining consent and deferring topical oxymetazoline due to possible allergy, the flexible endoscope was coated with lidocaine gel and was introduced and passed through the nasal cavity.  The nasopharynx, oropharynx, hypopharynx, and larynx  were then examined. No lesions appreciated in the mucosal axis.  The true  cords were symmetrically mobile.  She tolerated this well.    Lab Findings: Lab Results  Component Value Date   WBC 12.1 (H) 11/15/2022   HGB 13.0 11/15/2022   HCT 39.4 11/15/2022   MCV 88.3 11/15/2022   PLT 174 11/15/2022    Lab Results  Component Value Date   TSH 1.510 02/23/2022    Radiographic Findings: No results found.   Impression/Plan:    1) Head and Neck Cancer Status: NED  2) Nutritional Status: stable   I suspect her metabolism may be impacted by her lung disease.  Wt Readings from Last 3 Encounters:  04/12/23 88 lb 6.4 oz (40.1 kg)  04/11/23 88 lb 4.8 oz (40.1 kg)  02/07/23 86 lb 4.8 oz (39.1 kg)   PEG tube: None  3) Risk Factors: The patient has been educated about risk factors including alcohol and tobacco abuse; they understand that avoidance of alcohol and tobacco is important to prevent recurrences as well as other cancers  4) Swallowing: good function  5)  Thyroid function: Check annually - WNL- results pending today Lab Results  Component Value Date   TSH  1.510 02/23/2022    6)  Cont'd follow-up w/ ENT recommended. Pt will consider this in 47mo (knows to call to schedule).  I will see her back in 12 mo for repeat scope as well.   On date of service, in total, I spent 30 minutes on this encounter.  Patient seen in person. _____________________________________   Lonie Peak, MD

## 2023-04-12 NOTE — Progress Notes (Signed)
Ms. Leisher presents for follow-up after completing radiation to the larynx on 08/25/2019 (also completed radiation to her left lung on 11/21/2016)  Pain issues, if any: Denies Using a feeding tube?: N/A Weight changes, if any:  Wt Readings from Last 3 Encounters:  04/12/23 88 lb 6.4 oz (40.1 kg)  04/11/23 88 lb 4.8 oz (40.1 kg)  02/07/23 86 lb 4.8 oz (39.1 kg)   Swallowing issues, if any: Denies Cough/SOB/O2 use:  Denies any new issues or concerns. Continues to wear supplemental O2 (2L-[place]) Smoking or chewing tobacco? None Using fluoride toothpaste daily? Yes--denies any dental concerns Last ENT visit was on: Not since she saw Dr. Billy Fischer on 08/08/2021 --Procedure: Transnasal Flexible Laryngoscopy  Findings: The nasal cavity and nasopharynx are unremarkable. There are no suspicious findings in the nasopharynx or Fossa of Rosenmller. The tongue base, pharyngeal walls, piriform sinuses, vallecula, epiglottis and postcricoid region are normal in appearance. The visualized portion of the subglottis and proximal trachea is widely patent. The right cord is mobile. 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 The left true cord and arytenoid do demonstrate some hypomobility. There is no evidence of any disease today. There are no lesions or significant inflammation. There is no glottal insufficiency. There is no pooling of secretions or aspiration.  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 The left vocal fold did demonstrate continued hypomobility. I will plan on seeing her back every 6 months, as I will alternate with Dr. Basilio Cairo (Radiation Oncology)  Other notable issues, if any: Reports she had issues with pencil-like BMs several months ago, but this seems to have resolved  Saw medical oncologist Dr. Truett Perna yesterday  04/11/2023 --Disposition: Mr. Yuhas remains in clinical remission from lung cancer.  GI symptoms she has experienced over the past 2 years have resolved. The plan is to continue observation.  She will return for an office visit in 3 months.

## 2023-04-13 DIAGNOSIS — J449 Chronic obstructive pulmonary disease, unspecified: Secondary | ICD-10-CM | POA: Diagnosis not present

## 2023-05-14 DIAGNOSIS — J449 Chronic obstructive pulmonary disease, unspecified: Secondary | ICD-10-CM | POA: Diagnosis not present

## 2023-05-24 DIAGNOSIS — C3490 Malignant neoplasm of unspecified part of unspecified bronchus or lung: Secondary | ICD-10-CM | POA: Diagnosis not present

## 2023-05-24 DIAGNOSIS — C329 Malignant neoplasm of larynx, unspecified: Secondary | ICD-10-CM | POA: Diagnosis not present

## 2023-05-24 DIAGNOSIS — J441 Chronic obstructive pulmonary disease with (acute) exacerbation: Secondary | ICD-10-CM | POA: Diagnosis not present

## 2023-05-24 DIAGNOSIS — J9611 Chronic respiratory failure with hypoxia: Secondary | ICD-10-CM | POA: Diagnosis not present

## 2023-06-14 DIAGNOSIS — J449 Chronic obstructive pulmonary disease, unspecified: Secondary | ICD-10-CM | POA: Diagnosis not present

## 2023-07-11 ENCOUNTER — Inpatient Hospital Stay: Payer: Medicare Other | Attending: Oncology | Admitting: Oncology

## 2023-07-12 DIAGNOSIS — J449 Chronic obstructive pulmonary disease, unspecified: Secondary | ICD-10-CM | POA: Diagnosis not present

## 2023-07-28 ENCOUNTER — Encounter: Payer: Self-pay | Admitting: Internal Medicine

## 2023-08-12 DIAGNOSIS — J449 Chronic obstructive pulmonary disease, unspecified: Secondary | ICD-10-CM | POA: Diagnosis not present

## 2023-08-13 ENCOUNTER — Inpatient Hospital Stay: Attending: Oncology | Admitting: Oncology

## 2023-08-13 VITALS — BP 138/88 | HR 85 | Temp 98.2°F | Resp 18 | Ht 62.0 in | Wt 92.0 lb

## 2023-08-13 DIAGNOSIS — R21 Rash and other nonspecific skin eruption: Secondary | ICD-10-CM | POA: Diagnosis not present

## 2023-08-13 DIAGNOSIS — Z79899 Other long term (current) drug therapy: Secondary | ICD-10-CM | POA: Insufficient documentation

## 2023-08-13 DIAGNOSIS — C771 Secondary and unspecified malignant neoplasm of intrathoracic lymph nodes: Secondary | ICD-10-CM | POA: Insufficient documentation

## 2023-08-13 DIAGNOSIS — C3412 Malignant neoplasm of upper lobe, left bronchus or lung: Secondary | ICD-10-CM | POA: Insufficient documentation

## 2023-08-13 DIAGNOSIS — J449 Chronic obstructive pulmonary disease, unspecified: Secondary | ICD-10-CM | POA: Insufficient documentation

## 2023-08-13 DIAGNOSIS — C7889 Secondary malignant neoplasm of other digestive organs: Secondary | ICD-10-CM | POA: Insufficient documentation

## 2023-08-13 DIAGNOSIS — Z9981 Dependence on supplemental oxygen: Secondary | ICD-10-CM | POA: Insufficient documentation

## 2023-08-13 DIAGNOSIS — R197 Diarrhea, unspecified: Secondary | ICD-10-CM | POA: Diagnosis not present

## 2023-08-13 DIAGNOSIS — R635 Abnormal weight gain: Secondary | ICD-10-CM | POA: Insufficient documentation

## 2023-08-13 NOTE — Progress Notes (Signed)
 Brooke [name]   Diagnosis: Non-small cell lung cancer  INTERVAL HISTORY:   Brooke [name] returns as scheduled.  She feels well.  Good appetite.  Irregular bowel habits improved when she discontinued Lipitor.  She is gaining weight.  No new complaint.  She saw Dr. 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  Objective:  Vital signs in last 24 hours:  Blood pressure 138/88, pulse 85, temperature 98.2 F (36.8 C), temperature source Temporal, resp. rate 18, height 5\' 2"  (1.575 m), weight 92 lb (41.7 kg), SpO2 100%.   Lymphatics: No cervical, supraclavicular, axillary, or inguinal nodes Resp: Bronchial sounds at the left upper posterior chest, no respiratory distress Cardio: Regular rate and rhythm GI: No mass, no hepatosplenomegaly, nontender Vascular: No leg edema   Lab Results:  Lab Results  Component Value Date   WBC 12.1 (H) 11/15/2022   HGB 13.0 11/15/2022   HCT 39.4 11/15/2022   MCV 88.3 11/15/2022   PLT 174 11/15/2022   NEUTROABS 11.3 (H) 11/15/2022    CMP  Lab Results  Component Value Date   NA 138 01/31/2023   K 3.9 01/31/2023   CL 100 01/31/2023   CO2 32 01/31/2023   GLUCOSE 87 01/31/2023   BUN 13 01/31/2023   CREATININE 0.61 01/31/2023   CALCIUM 9.7 01/31/2023   PROT 6.8 12/27/2022   ALBUMIN 4.4 12/27/2022   AST 18 12/27/2022   ALT 14 12/27/2022   ALKPHOS 52 12/27/2022   BILITOT 0.3 12/27/2022   GFRNONAA >60 01/31/2023   GFRAA >60 02/05/2020     Medications: I have reviewed the patient's current medications.   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 PDL1 60% Left lung radiation 11/08/2016 through 11/21/2016 CT chest 12/12/2016-reexpansion of left upper lobe with a decreased left upper lobe mass, unchanged mediastinal adenopathy, progression of a splenic metastasis/pancreatic tail/gastrohepatic ligament metastasis, right lower lobe pneumonia Cycle 1 Pembrolizumab 12/14/2016 Cycle 2 Pembrolizumab 01/03/2017 Cycle 3 Pembrolizumab 01/24/2017 Cycle 4 Pembrolizumab 02/12/2017 CT 03/05/2018-decrease in left upper lobe mass, mediastinal adenopathy, and splenic mass Cycle 5 pembrolizumab 03/07/2017 Cycle 6 Pembrolizumab 04/02/2017 Cycle 7 pembrolizumab 04/25/2017 Cycle 8 Pembrolizumab 05/14/2017 Cycle 9 Pembrolizumab 06/06/2017 CT 06/20/2017-mild decrease in left upper lobe mass, mild increase in paramediastinal left upper lobe nodule-radiation change?, decreased splenic metastasis Cycle 10 pembrolizumab 06/25/2017 Cycle 11 pembrolizumab 07/18/2017 Cycle 12 pembrolizumab 08/29/2017 Cycle 13 pembrolizumab 09/17/2017 Cycle 14 Pembrolizumab 10/10/2017 CT chest 10/24/2017- slight enlargement of small mediastinal lymph nodes, increased left upper lobe consolidation, decreased size of spleen lesion Cycle 15 pembrolizumab 10/29/2017 Cycle 16 Pembrolizumab 11/21/2017 Cycle 17 Pembrolizumab 12/11/2017 Cycle 18 pembrolizumab 01/02/2018 Cycle 19 Pembrolizumab 01/21/2018 Cycle 20 Pembrolizumab 02/13/2018 CT chest 02/27/2018- decreased left paratracheal node, progressive consolidation/fibrosis in the left upper lung, decreased size of splenic mass Cycle 21 pembrolizumab 03/04/2018 Cycle 22 Pembrolizumab 03/27/2018  Cycle 23 pembrolizumab 04/15/2018 Cycle 24 pembrolizumab 05/08/2018 Cycle 25 Pembrolizumab 05/27/2018 CT chest 06/16/2018- stable left upper lung radiation fibrosis with no evidence of local tumor recurrence.  Decreased left paratracheal adenopathy.  No new or progressive  metastatic disease in the chest.  Gastrohepatic ligament lymphadenopathy stable.  Splenic metastasis decreased.  T5  vertebral compression fracture with associated patchy sclerosis and no discrete osseous lesion, new in the interval. Cycle 26 Pembrolizumab 06/19/2018 Cycle 27 pembrolizumab 07/08/2018 Cycle 28 pembrolizumab 07/31/2018 Cycle 29 pembrolizumab 08/19/2018 Cycle 30 Pembrolizumab 09/11/2018 Cycle 31 pembrolizumab 09/30/2018 Cycle 32 pembrolizumab 10/23/2018 CT chest 10/28/2018- left apical pleural-parenchymal opacity consistent with radiation scarring has become more confluent likely representing evolutionary change.  Continued further decrease in size of index left paratracheal node and splenic metastasis.  Gastrohepatic ligament lymph node not changed. Cycle 33 Pembrolizumab 11/11/2018 Cycle 34 pembrolizumab 12/04/2018 Cycle 35 pembrolizumab 12/23/2018 CT chest 02/12/2019- posttreatment changes left upper lobe unchanged.  Continued decrease in size of splenic metastasis.  Stable appearance of left paratracheal lymph nodes.  Stable gastrohepatic adenopathy.  Incomplete visualization of retroperitoneal lymph nodes in the upper abdomen. CT chest 07/02/2019-stable post treatment related changes left lung.  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  Slight regression of enlarged likely metastatic gastrohepatic lymph node. CT chest 11/11/2019-interval enlargement of a right paratracheal lymph node measuring 14 mm, increased from 5 mm.  No new lung mass or nodularity.  New adenopathy in the upper abdomen.  2.7 cm lesion gastrohepatic ligament.  Necrotic node at the splenic hilum measures 4.7 cm.  Large node along the IVC left upper quadrant measures 2.7 cm.  Similar node left adjacent the aorta measures 1.8 cm.  Enhancing lesion in the spleen is unchanged.  Several low-density lesions in the liver are unchanged. Pembrolizumab resumed on a 3-week schedule 12/03/2019 CT chest 02/22/2020-interval resolution of  previously demonstrated left paratracheal and upper abdominal adenopathy.  Stable treated metastasis within the spleen.  Stable radiation changes in the left lung with left hilar distortion.  No evidence of local recurrence or progressive metastatic disease. Continuation of pembrolizumab every 3 weeks 02/26/2020 CT chest 06/28/2020-no change in left upper lobe consolidation, unchanged spleen metastasis, no evidence of progressive disease Pembrolizumab continued every 3 weeks 07/01/2020 CT chest 11/02/2020-stable posttreatment changes at the left hilum and left upper lobe, decreased size of spleen lesion, no evidence of disease progression Pembrolizumab every 3 weeks continued CT chest 01/12/2021-negative for acute pulmonary embolus.  Stable radiation fibrosis left upper lobe.  Stable low-density lesion in the spleen.  No new or progressive findings. 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  Splenic lesion stable.  No upper abdominal lymphadenopathy. Pembrolizumab every 3 weeks continued CT chest 08/29/2021-unchanged consolidation/fibrosis at the left upper lung, no evidence of metastatic disease in the chest, COPD, multiple compression fractures, new T11 compression fracture, stable spleen lesion by my review Pembrolizumab every 3 weeks continued Treatment held 10/13/2021 for diarrhea, C. difficile negative Pembrolizumab 11/10/2021 Pembrolizumab 12/01/2021 CTs 12/07/2021-similar treatment effect within the left upper lobe including volume loss, traction bronchiectasis and consolidation.  No local recurrent or active metastatic disease.  Diffuse colitis most significant within the cecum.  New trace left pleural fluid. CTs 03/11/2022-stable radiation fibrosis in the upper left lung,  no evidence of local tumor recurrence, no evidence of metastatic disease, normal large bowel without wall thickening CTs 07/10/2022-stable volume loss in the left hemithorax, stable small right middle lobe nodules, stable complex cystic/calcified lesion in the spleen-slightly more dense, persistent isolated biliary dilation in segment 5 colonic stool, stable thoracic compression fractures CTs 01/31/2023-posttreatment scarring left upper lobe.  No evidence of recurrent or metastatic  disease.     2.  05/29/2019 laryngoscopy-exophytic appearing mass mid right vocal cord.   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  Tumor cells negative for p16, CMV and HSV 2.  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 Radiation 07/29/2019-08/25/2019   3. History of acute respiratory failure secondary to #1   4. Oxygen dependent COPD   5. History of a NSTEMI 2015   6. Right lung pneumonia on chest CT 12/12/2016-treated with Levaquin   7.  Grade 2 rash possibly related to Pembrolizumab.  Treatment held 08/06/2017.  Improved 08/29/2017, treatment resumed, rash has resolved   8.  Vertigo January 2022-improved with course of Augmentin for ear/sinus infection   9.  Diarrhea beginning early June 2023; negative C. difficile 10/13/2021; CTs 12/07/2021-diffuse colitis most significant within the cecum. -persistent left abdominal pain and constipation, Virtual colonosocopy 11/15/2022- no mass, stricture, or polyp   10.  Isolated segment 5 bile duct dilation       Disposition: Brooke [name] appears stable.  She is in clinical remission from lung cancer.  She will return for an office visit and surveillance chest CT in 4 months.  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 Basilio Cairo.  Thornton Papas, MD  08/13/2023  10:10 AM

## 2023-08-14 ENCOUNTER — Telehealth: Payer: Self-pay | Admitting: Oncology

## 2023-08-14 NOTE — Telephone Encounter (Signed)
 Contacted pt to schedule fu appt but wishes that we call back in a few days to schedule.   Follow-up disposition: Return for Scan, office.  Check out comments: Office 4 months CT 1-2 weeks prior to office

## 2023-08-19 NOTE — Telephone Encounter (Signed)
 Patient has been scheduled. Aware of appt date and time.

## 2023-08-27 DIAGNOSIS — J432 Centrilobular emphysema: Secondary | ICD-10-CM | POA: Diagnosis not present

## 2023-08-27 DIAGNOSIS — Z9981 Dependence on supplemental oxygen: Secondary | ICD-10-CM | POA: Diagnosis not present

## 2023-08-27 DIAGNOSIS — Z85118 Personal history of other malignant neoplasm of bronchus and lung: Secondary | ICD-10-CM | POA: Diagnosis not present

## 2023-08-27 DIAGNOSIS — J9611 Chronic respiratory failure with hypoxia: Secondary | ICD-10-CM | POA: Diagnosis not present

## 2023-09-11 DIAGNOSIS — J449 Chronic obstructive pulmonary disease, unspecified: Secondary | ICD-10-CM | POA: Diagnosis not present

## 2023-10-12 DIAGNOSIS — J449 Chronic obstructive pulmonary disease, unspecified: Secondary | ICD-10-CM | POA: Diagnosis not present

## 2023-10-29 DIAGNOSIS — Z87891 Personal history of nicotine dependence: Secondary | ICD-10-CM | POA: Diagnosis not present

## 2023-10-29 DIAGNOSIS — C229 Malignant neoplasm of liver, not specified as primary or secondary: Secondary | ICD-10-CM | POA: Diagnosis not present

## 2023-11-11 DIAGNOSIS — J449 Chronic obstructive pulmonary disease, unspecified: Secondary | ICD-10-CM | POA: Diagnosis not present

## 2023-11-18 DIAGNOSIS — Z9981 Dependence on supplemental oxygen: Secondary | ICD-10-CM | POA: Diagnosis not present

## 2023-11-18 DIAGNOSIS — J9611 Chronic respiratory failure with hypoxia: Secondary | ICD-10-CM | POA: Diagnosis not present

## 2023-11-27 ENCOUNTER — Encounter (HOSPITAL_BASED_OUTPATIENT_CLINIC_OR_DEPARTMENT_OTHER): Payer: Self-pay | Admitting: Cardiovascular Disease

## 2023-11-27 ENCOUNTER — Other Ambulatory Visit (HOSPITAL_BASED_OUTPATIENT_CLINIC_OR_DEPARTMENT_OTHER)

## 2023-11-27 ENCOUNTER — Ambulatory Visit (HOSPITAL_BASED_OUTPATIENT_CLINIC_OR_DEPARTMENT_OTHER): Admitting: Cardiovascular Disease

## 2023-11-27 VITALS — BP 124/78 | HR 63 | Ht 63.0 in | Wt 96.0 lb

## 2023-11-27 DIAGNOSIS — R0602 Shortness of breath: Secondary | ICD-10-CM

## 2023-11-27 DIAGNOSIS — R000 Tachycardia, unspecified: Secondary | ICD-10-CM

## 2023-11-27 DIAGNOSIS — Z5181 Encounter for therapeutic drug level monitoring: Secondary | ICD-10-CM

## 2023-11-27 DIAGNOSIS — E785 Hyperlipidemia, unspecified: Secondary | ICD-10-CM | POA: Diagnosis not present

## 2023-11-27 NOTE — Progress Notes (Signed)
 Cardiology Office Note:  .   Date:  11/27/2023  ID:  Brooke [name], DOB 30-Oct-1945, MRN [id number] PCP: Brooke [name] DEL, MD  [facility] Providers Cardiologist:  Brooke Scarce, MD    History of Present Illness: .    Brooke [name] is a 78 y.o. female with COPD on home O2, nonobstructive CAD, myocardial bridge, hyperlipidemia, stage IV squamous cell lung cancer, radiation fibrosis, GERD, and prior tobacco abuse here for follow-up.  She was initially seen by cardiology 06/2013 when she had an NSTEMI.  Heart cath at that time revealed minimal, non-obstructive CAD.  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  LVEF was 60%.  It was thought that her symptoms were attributable to spasm.  She takes atorvastatin  every other day to avoid myalgias.  Amlodipine  was started for spasms.  She was diagnosed with lung cancer in 2017.  A bronchoscopy was recommended but she declined at that time.  In 10/2016 she developed progressive symptoms with metastatic disease.  She completed 35 cycles of chemotherapy with pembrolizumab .  This helped her metastatic disease but not her primary mass.  She was subsequently restarted on chemotherapy with pembrolizumab .   [name] saw Wales, Brooke [name], on 12/2019 with elevated blood pressure.  He recommended watchful waiting at that time.  It was noted that anxiety tended to trigger her blood pressure.  At her visit 09/2021 she was struggling with weight loss and back pain attributable to her compression fractures.  Her blood pressures continue to be very labile and she was adjusting her amlodipine  dose based on her blood pressure levels each day.  At her visit 03/2022 she reported that she was cancer free and blood pressures were better controlled.  She last saw Dr. Cloretta on 08/2018 for and her oncology report remained stable.  Discussed the use of AI scribe software for clinical note transcription with the patient, who gave verbal consent to  proceed.  History of Present Illness Brooke [name] has experienced significant weight loss in the past, dropping to the seventy-pound range, but has since regained weight to ninety-six pounds. She attributes the weight gain to stopping all medications, including Lipitor  and Depamidrol, and consuming probiotics and yogurt. Despite eating less than before, she has maintained her weight gain.  She has a history of COPD and was previously on Depamidrol. She experiences occasional situational hypertension, for which she takes amlodipine  once a week or less. Her blood pressure is usually around 120/70 or 130/76, but can rise to 160 if she is stressed or her ride does not show up.  She engages in leg exercises and walks around her house, covering about a hundred feet in circles. She also visits places like the farmer's market with assistance, where she can manage her oxygen  tank and pocketbook. She notes feeling weaker recently.  She experiences occasional pain when breathing hard, which she associates with a spot on her lung where she had radiation. This pain resolves with rest or increased oxygen . She denies chest pain or pressure when walking.  She mentions episodes where her heart rate increases to 130 beats per minute, occurring a couple of times a week but lasting only a few minutes. She does not feel these episodes and attributes them to cold hands. Her heart rate typically returns to 77 beats per minute.  Her diet includes steamed vegetables, meat, yogurt, and fresh fruits like peaches. She enjoys eating and considers food her major entertainment. She has  been consuming more fiber, including Metamucil crackers and oat bread.  She has not taken Lipitor  for six months and is interested in checking her cholesterol levels. She fasted since the previous night in preparation for this evaluation.  ROS:  As per HPI  Studies Reviewed: SABRA   EKG Interpretation Date/Time:  Wednesday November 27 2023 09:50:53  EDT Ventricular Rate:  82 PR Interval:  124 QRS Duration:  78 QT Interval:  366 QTC Calculation: 427 R Axis:   92  Text Interpretation: Normal sinus rhythm Rightward axis Moderate voltage criteria for LVH, may be normal variant No significant change since last tracing Confirmed by Raford Riggs ([id number]) on 11/27/2023 9:58:32 AM    EKG Interpretation Date/Time:  Wednesday November 27 2023 09:50:53 EDT Ventricular Rate:  82 PR Interval:  124 QRS Duration:  78 QT Interval:  366 QTC Calculation: 427 R Axis:   92  Text Interpretation: Normal sinus rhythm Rightward axis Moderate voltage criteria for LVH, may be normal variant No significant change since last tracing Confirmed by Raford Riggs ([id number]) on 11/27/2023 9:58:32 AM        Risk Assessment/Calculations:             Physical Exam:   VS:  BP 124/78   Pulse 63   Ht 5' 3 (1.6 m)   Wt 96 lb (43.5 kg)   SpO2 90%   BMI 17.01 kg/m  , BMI Body mass index is 17.01 kg/m. GENERAL:  Well appearing HEENT: Pupils equal round and reactive, fundi not visualized, oral mucosa unremarkable NECK:  No jugular venous distention, waveform within normal limits, carotid upstroke brisk and symmetric, no bruits, no thyromegaly LUNGS:  Clear to auscultation bilaterally HEART:  RRR.  PMI not displaced or sustained,S1 and S2 within normal limits, no S3, no S4, no clicks, no rubs, no murmurs ABD:  Flat, positive bowel sounds normal in frequency in pitch, no bruits, no rebound, no guarding, no midline pulsatile mass, no hepatomegaly, no splenomegaly EXT:  2 plus pulses throughout, no edema, no cyanosis no clubbing SKIN:  No rashes no nodules NEURO:  Cranial nerves II through XII grossly intact, motor grossly intact throughout PSYCH:  Cognitively intact, oriented to person place and time   ASSESSMENT AND PLAN: .    Assessment & Plan # Tachycardia:  Episodes of tachycardia with heart rate up to 130 bpm monthly. Possible atrial fibrillation with  stroke risk discussed.  She is completely asymptomatic. - Apply 14-day heart monitor to evaluate for atrial fibrillation.  # Hypertension Blood pressure situationally elevated, controlled at home. - Continue amlodipine .  She adjusts her dose based on her BP each day.   General Health Maintenance Weight increased from 70 to 96 pounds after dietary changes. Emphasized balanced diet. - Order lipid panel and comprehensive metabolic panel. - Follow up in one year, sooner if needed. - Call with lab and monitor results.   Dispo: f/u 1 year  Signed, Riggs Raford, MD

## 2023-11-27 NOTE — Patient Instructions (Signed)
 Medication Instructions:  Your physician recommends that you continue on your current medications as directed. Please refer to the Current Medication list given to you today.   *If you need a refill on your cardiac medications before your next appointment, please call your pharmacy*  Lab Work: LP/CMET TODAY   If you have labs (blood work) drawn today and your tests are completely normal, you will receive your results only by: MyChart Message (if you have MyChart) OR A paper copy in the mail If you have any lab test that is abnormal or we need to change your treatment, we will call you to review the results.  Testing/Procedures: 14 DAY ZIO   Follow-Up: At [facility], you and your health needs are our priority.  As part of our continuing mission to provide you with exceptional heart care, our providers are all part of one team.  This team includes your primary Cardiologist (physician) and Advanced Practice Providers or APPs (Physician Assistants and Nurse Practitioners) who all work together to provide you with the care you need, when you need it.  Your next appointment:   12 month(s)  Provider:   Annabella Scarce, MD, Rosaline Bane, NP, or Reche Finder, NP     We recommend signing up for the patient portal called MyChart.  Sign up information is provided on this After Visit Summary.  MyChart is used to connect with patients for Virtual Visits (Telemedicine).  Patients are able to view lab/test results, encounter notes, upcoming appointments, etc.  Non-urgent messages can be sent to your provider as well.   To learn more about what you can do with MyChart, go to [email or web address].   Other Instructions  ZIO XT- Long Term Monitor Instructions  Your physician has requested you wear a ZIO patch monitor for 14 days.  This is a single patch monitor. [employer] supplies one patch monitor per enrollment. Additional stickers are not available. Please do not apply patch if  you will be having a Nuclear Stress Test,  Echocardiogram, Cardiac CT, MRI, or Chest Xray during the period you would be wearing the  monitor. The patch cannot be worn during these tests. You cannot remove and re-apply the  ZIO XT patch monitor.  Your ZIO patch monitor will be mailed 3 day USPS to your address on file. It may take 3-5 days  to receive your monitor after you have been enrolled.  Once you have received your monitor, please review the enclosed instructions. Your monitor  has already been registered assigning a specific monitor serial # to you.  Billing and Patient Assistance Program Information  We have supplied [employer] with any of your insurance information on file for billing purposes. [employer] offers a sliding scale Patient Assistance Program for patients that do not have  insurance, or whose insurance does not completely cover the cost of the ZIO monitor.  You must apply for the Patient Assistance Program to qualify for this discounted rate.  To apply, please call [employer] at [phone], select option 4, select option 2, ask to apply for  Patient Assistance Program. Meredeth will ask your household income, and how many people  are in your household. They will quote your out-of-pocket cost based on that information.  [employer] will also be able to set up a 49-month, interest-free payment plan if needed.  Applying the monitor   Shave hair from upper left chest.  Hold abrader disc by orange tab. Rub abrader in 40 strokes over the upper left chest as  indicated in your monitor instructions.  Clean area with 4 enclosed alcohol pads. Let dry.  Apply patch as indicated in monitor instructions. Patch will be placed under collarbone on left  side of chest with arrow pointing upward.  Rub patch adhesive wings for 2 minutes. Remove white label marked 1. Remove the white  label marked 2. Rub patch adhesive wings for 2 additional minutes.  While looking in a mirror, press and  release button in center of patch. A small green light will  flash 3-4 times. This will be your only indicator that the monitor has been turned on.  Do not shower for the first 24 hours. You may shower after the first 24 hours.  Press the button if you feel a symptom. You will hear a small click. Record Date, Time and  Symptom in the Patient Logbook.  When you are ready to remove the patch, follow instructions on the last 2 pages of Patient  Logbook. Stick patch monitor onto the last page of Patient Logbook.  Place Patient Logbook in the blue and white box. Use locking tab on box and tape box closed  securely. The blue and white box has prepaid postage on it. Please place it in the mailbox as  soon as possible. Your physician should have your test results approximately 7 days after the  monitor has been mailed back to [facility].  Call [facility] Customer Care at [phone] if you have questions regarding  your ZIO XT patch monitor. Call them immediately if you see an orange light blinking on your  monitor.  If your monitor falls off in less than 4 days, contact our Monitor department at [phone].  If your monitor becomes loose or falls off after 4 days call [employer] at [phone] for  suggestions on securing your monitor

## 2023-11-28 LAB — COMPREHENSIVE METABOLIC PANEL WITH GFR
ALT: 15 IU/L (ref 0–32)
AST: 23 IU/L (ref 0–40)
Albumin: 4.2 g/dL (ref 3.8–4.8)
Alkaline Phosphatase: 74 IU/L (ref 44–121)
BUN/Creatinine Ratio: 21 (ref 12–28)
BUN: 9 mg/dL (ref 8–27)
Bilirubin Total: 0.4 mg/dL (ref 0.0–1.2)
CO2: 25 mmol/L (ref 20–29)
Calcium: 9.2 mg/dL (ref 8.7–10.3)
Chloride: 98 mmol/L (ref 96–106)
Creatinine, Ser: 0.43 mg/dL — ABNORMAL LOW (ref 0.57–1.00)
Globulin, Total: 2.7 g/dL (ref 1.5–4.5)
Glucose: 79 mg/dL (ref 70–99)
Potassium: 4.7 mmol/L (ref 3.5–5.2)
Sodium: 137 mmol/L (ref 134–144)
Total Protein: 6.9 g/dL (ref 6.0–8.5)
eGFR: 99 mL/min/1.73 (ref >59)

## 2023-11-28 LAB — LIPID PANEL
Chol/HDL Ratio: 2.3 ratio (ref 0.0–4.4)
Cholesterol, Total: 272 mg/dL — ABNORMAL HIGH (ref 100–199)
HDL: 118 mg/dL (ref >39)
LDL Chol Calc (NIH): 146 mg/dL — ABNORMAL HIGH (ref 0–99)
Triglycerides: 51 mg/dL (ref 0–149)
VLDL Cholesterol Cal: 8 mg/dL (ref 5–40)

## 2023-12-06 ENCOUNTER — Ambulatory Visit: Payer: Self-pay | Admitting: Cardiovascular Disease

## 2023-12-06 DIAGNOSIS — E785 Hyperlipidemia, unspecified: Secondary | ICD-10-CM

## 2023-12-12 DIAGNOSIS — J449 Chronic obstructive pulmonary disease, unspecified: Secondary | ICD-10-CM | POA: Diagnosis not present

## 2023-12-13 MED ORDER — ROSUVASTATIN CALCIUM 10 MG PO TABS: 10.0000 mg | ORAL_TABLET | Freq: Every day | ORAL | 3 refills | Status: AC

## 2023-12-16 DIAGNOSIS — R000 Tachycardia, unspecified: Secondary | ICD-10-CM | POA: Diagnosis not present

## 2023-12-16 DIAGNOSIS — R0602 Shortness of breath: Secondary | ICD-10-CM | POA: Diagnosis not present

## 2023-12-26 ENCOUNTER — Other Ambulatory Visit (HOSPITAL_BASED_OUTPATIENT_CLINIC_OR_DEPARTMENT_OTHER)

## 2023-12-27 DIAGNOSIS — R0602 Shortness of breath: Secondary | ICD-10-CM | POA: Diagnosis not present

## 2023-12-27 DIAGNOSIS — R000 Tachycardia, unspecified: Secondary | ICD-10-CM

## 2024-01-01 ENCOUNTER — Ambulatory Visit (HOSPITAL_BASED_OUTPATIENT_CLINIC_OR_DEPARTMENT_OTHER)
Admission: RE | Admit: 2024-01-01 | Discharge: 2024-01-01 | Disposition: A | Discharge status: Home / Self Care | Source: Ambulatory Visit | Attending: Oncology | Admitting: Oncology

## 2024-01-01 DIAGNOSIS — J439 Emphysema, unspecified: Secondary | ICD-10-CM | POA: Diagnosis not present

## 2024-01-01 DIAGNOSIS — C3412 Malignant neoplasm of upper lobe, left bronchus or lung: Secondary | ICD-10-CM | POA: Diagnosis not present

## 2024-01-01 DIAGNOSIS — C3490 Malignant neoplasm of unspecified part of unspecified bronchus or lung: Secondary | ICD-10-CM | POA: Diagnosis not present

## 2024-01-01 DIAGNOSIS — I700 Atherosclerosis of aorta: Secondary | ICD-10-CM | POA: Diagnosis not present

## 2024-01-03 ENCOUNTER — Other Ambulatory Visit: Admitting: Oncology

## 2024-01-07 ENCOUNTER — Ambulatory Visit: Payer: Self-pay | Admitting: Oncology

## 2024-01-08 ENCOUNTER — Inpatient Hospital Stay: Admitting: Oncology

## 2024-01-09 DIAGNOSIS — Z5181 Encounter for therapeutic drug level monitoring: Secondary | ICD-10-CM | POA: Diagnosis not present

## 2024-01-09 DIAGNOSIS — M810 Age-related osteoporosis without current pathological fracture: Secondary | ICD-10-CM | POA: Diagnosis not present

## 2024-01-09 NOTE — Progress Notes (Signed)
 Health Maintenance Visit Brooke [name] DOB: 25-Jul-1945  MRN: [id number] Visit Date: 01/09/2024  Encounter Provider: Laneta Blush Agent, PA-C  HPI HEALTH MAINTENANCE: Brooke [name] presents for a health maintenance exam. SCREENING TESTS: ---Last physical exam: 01/08/2023 ---Last colonoscopy: Had virtual colonoscopy 11/15/2022 that showed no mass, strictures, or polyps.  Endorses having penciling of stools chronically and has had multiple evaluations by GI without resolution. ---Last PAP smear: no longer recommended due to age ---Last mammogram: 2019 ---Last DEXA scan: 06/22/21; osteoporosis and taking women's multivitamin; drinks 2/3 gallon of milk per day; declines repeat LIFESTYLE:  ---Sleep hours: adequate ---Seatbelt use: always LAB/IMMUNIZATIONS: ---Last lipid profile: 11/27/2023 with cardiology; total 272, triglycerides 51, HDL 118, LDL 146 ---Last tetanus vaccine: None on file; she will consider ---Last influenza vaccine: 2017; reports having bad reaction to last flu shot she had with severe vomiting ---Last COVID vaccine: never; declines ---Last pneumococcal vaccine: PPSV23 on 02/04/2013 and PCV 13 on 01/07/2014; due for 1 dose of Prevnar 20; she will consider ---Shingrix vaccine: Never; declines today; has never had chicken pox/vaccine ---Hep C antibody: Never; today MEDICATIONS: OTC Vitamins: multivitamin Other OTC Meds: Colace  Other topics discussed today: Situational anxiety - She takes Xanax  0.25 mg 1/4 tab up to 1-2 times daily for situational anxiety, such as medical procedures or doctors visits.  This works well for her. Reviewed [place] Controlled Substance Registry, and alprazolam  0.25 mg #20 was last filled on 12/10/23, and no concerns are present.  Hypertension/hyperlipidemia - She follows with Dr. Annabella Scarce with cardiology. Previously had NSTEMI in 2015. Last office visit was 11/27/2023.  She reported episodes of tachycardia with heart rate up to 130 bpm, occurring  monthly.  Asymptomatic otherwise.  Ordered 14-day Zio patch.  She has labile hypertension and continues to use amlodipine  day-to-day as needed. She was not taking atorvastatin  20 mg at time of last labs in July, and once results obtained, cardiology discontinued and changed to rosuvastatin  10 mg daily. She is not taking rosuvastatin  but is taking amlodipine  daily as needed as prescribed.  COPD - She follows with Dr. Darlean with pulmonology for management.  She uses continuous supplemental oxygen  1 L/min and Symbicort  inhaler.  Uses albuterol  and DuoNeb as needed.  She continues to have shortness of breath. She states that inhalers are prescribed by PCP, as prior PCP initially started these medications, and she continues to follow with pulmonology.  NSCLC -She follows with oncology for NSCLC, which was diagnosed in 2017.  Later had progressive symptoms with metastatic disease June 2018. She underwent 35 cycles of chemotherapy with pembrolizumab  for lung cancer.  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  Reports she is cancer free since 2023.  ROS: Review of Systems  Constitutional:  Negative for chills, fatigue and fever.  HENT:  Negative for ear pain, hearing loss and tinnitus.   Eyes:  Negative for pain and visual disturbance.  Respiratory:  Positive for cough and shortness of breath. Negative for chest tightness.   Cardiovascular:  Negative for chest pain, palpitations and leg swelling.  Gastrointestinal:  Positive for abdominal pain (Chronic periodic LLQ pain) and constipation (Uses Colace with some relief). Negative for blood in stool, diarrhea, nausea and vomiting.  Genitourinary:  Negative for difficulty urinating, dysuria, flank pain, frequency, hematuria and urgency.  Musculoskeletal:  Negative for back pain, joint swelling and myalgias.  Skin:  Negative for color change, rash and wound.  Neurological:  Positive for dizziness (Periodic  vertigo). Negative for syncope, light-headedness and headaches.  Psychiatric/Behavioral:  Negative for decreased concentration and suicidal ideas. The patient is not nervous/anxious.      Behavioral Health Screening  Patient Health Questionnaire-2 Score: 0 (01/09/2024  9:28 AM)      Patient's Depression screening is Negative   Depression Plan: Normal/Negative Screening  Medical History: Medical History[1]  Patient Active Problem List   Diagnosis Date Noted  . Left lower quadrant abdominal pain 01/14/2023  . Essential hypertension 01/08/2023  . Pure hypercholesterolemia 01/08/2023  . Sensorineural hearing loss (SNHL), bilateral 07/04/2020  . Vertigo 07/04/2020  . History of laryngeal cancer 07/04/2020  . SOB (shortness of breath) 06/13/2020  . 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 Hoarseness 04/07/2019  . Primary cancer of left lung metastatic to other site (HCC) 01/19/2018  . Encounter for Medicare annual wellness exam 07/20/2017  . Situational anxiety 01/26/2017  . Malignant neoplasm of bronchus of left upper lobe (HCC) 11/06/2016  . Chronic respiratory failure with hypoxia (HCC) 02/15/2016  . Encounter for general adult medical examination with abnormal findings 01/19/2016  . History of tobacco use 09/05/2015  . Gastroesophageal reflux disease 09/05/2015  . Chronic obstructive pulmonary disease (HCC) 06/05/2013  . COPD mixed type (HCC) 06/05/2013   Current Medications:  Medications Ordered Prior to Encounter[2] Current Medications[3]  Allergies: Allergies[4]   Immunizations:  Immunization History  Administered Date(s) Administered  . Influenza, high-dose, trivalent, PF 02/08/2016  . Pfizer SARS-CoV-2 Primary Series 12+ yrs 07/07/2019, 08/05/2019, 01/15/2020  . Pneumococcal Conjugate 13-Valent 01/07/2014  . Pneumococcal Polysaccharide Vaccine, 23 Valent (PNEUMOVAX-23) 2Y+ 02/04/2013    Surgical History- Surgical History[5]  Family history- Family  History[6] Social history- Social History[7]   Physical Exam: BP (!) 148/95   Pulse 90   Temp 98.2 F (36.8 C)   Ht 1.295 m (4' 3)   Wt 42 kg (92 lb 9.6 oz)   SpO2 98% Comment: 2L O2  BMI 25.03 kg/m  Wt Readings from Last 3 Encounters:  01/09/24 42 kg (92 lb 9.6 oz)  01/08/23 38.2 kg (84 lb 3.2 oz)  01/02/22 37.5 kg (82 lb 9.6 oz)   General Appearance: Well nourished, in NAD and had pleasant demeanor. Head: Normocephalic and atraumatic  Eyes: PERRLA. EOMI.  Conjunctiva is pink without edema, erythema or yellowing.  Ears: No erythema, edema or tenderness on both external ear cartilages and ear canals.  TMs are intact bilaterally with normal light reflexes and without erythema, edema and bulging. Nose: Nose is symmetrical and turbinates are pink and not erythematous, edematous or pale.  Throat: Oral pharynx is pink and moist.  No erythema or edema in pharynx or posterior pharynx.  Mucosa is intact and without lesions.  Tonsils do not have exudate.  Uvula is midline and not swollen.  Good dentition and pink gums are not inflamed. Neck: Supple, no JVD, no carotid bruits, no LAD, no thyromegaly or thyroid  masses.  Trachea is midline.  Full range of motion in neck intact. Respiratory:  CTAB, No r/r/w.  No increased effort of breathing. Cardio: RRR.  No m/r/g.  S1S2nl.   Breasts: Deferred Abdomen: Symmetrical, soft, nontender, and rounded.  +BS nl x4.  No Rebound. No Guarding.   Genitourinary: Deferred Extremities: No C/C/E in upper and lower extremities. Radial and PT pulses B/L +2. Musculoskeletal: Range of motion and strength intact bilaterally in upper and lower extremities.  No kyphosis, lordosis, scoliosis. Skin: Warm, dry without rashes, lesions, ecchymosis, yellowing, cyanosis.  Neuro: Alert and oriented  X3, cooperative.  Mood and affect appropriate to situation.  CN II-XII grossly intact.  Motor 5/5 in upper and lower extremities. Sensation intact in upper and lower extremities.   Normal gait. Psych: Insight and Judgment appropriate.  Assessment and Plan:  1. Encounter for Medicare annual wellness exam (Primary) - Reviewed responses.  2. Encounter for physical examination - Reviewed age appropriate recommendations. Discussed recommendations for at least 150 minutes of exercise per week. Discussed healthy diet recommendations. - She declines vaccines but will consider Prevnar 20, Shingrix, and Tdap.  Encouraged her to consider Prevnar 20 due to COPD.  3. Medication monitoring encounter - CBC with Differential - Comprehensive Metabolic Panel  4. Situational anxiety -Stable.  Continue as prescribed. - ALPRAZolam  (XANAX ) 0.25 mg tablet; Take 0.5-1 tablets (0.125-0.25 mg total) by mouth 2 (two) times a day as needed for anxiety.  Dispense: 20 tablet; Refill: 5  5. Pure hypercholesterolemia -Advised her to start taking rosuvastatin  as prescribed by cardiology.  Continue following with cardiology as directed.  6. Essential hypertension -Elevated in office today but she has not yet taken amlodipine .  Continue taking as prescribed.  7. COPD mixed type (HCC) -Stable and continues to use supplemental oxygen  continuously. - albuterol  HFA (PROVENTIL  HFA;VENTOLIN  HFA;PROAIR  HFA) 90 mcg/actuation inhaler; Inhale 2 puffs every 6 (six) hours as needed for wheezing or shortness of breath.  Dispense: 8.5 g; Refill: 11 - budesonide -formoteroL  (SYMBICORT ;BREYNA ) 160-4.5 mcg/actuation inhaler; Inhale 2 puffs 2 (two) times a day.  Dispense: 10.2 g; Refill: 11 - ipratropium-albuteroL  (DUO-NEB) 0.5-2.5 mg/3 mL nebulizer solution; Use one vial in nebulizer 4 times a day as needed  Dispense: 540 mL; Refill: 11  8. History of lung cancer 9. History of laryngeal cancer -Continue following with oncology as directed.  10. Age-related osteoporosis without current pathological fracture -Declines repeat DEXA scan.  Checking vitamin D  today.  Continue daily multivitamin. - Vitamin D ,  25-Hydroxy  11. Encounter for hepatitis C screening test for low risk patient - Hepatitis C Virus (HCV) Antibody Screen With Confirmation   Return in 1 year (on 01/09/2025) for SAWV/med ck/fasting labs (50 min).   Patient Instructions  You were seen today for your annual physical exam. If you have the patient portal set up, we will message with the results of your labs. If you don't have the patient portal set up, we will call you with the results of your labs. Please consider getting the pneumonia vaccine (Prevnar-20). You can get this at your pharmacy or at our office. Please consider the tetanus and shingles vaccines. Medicare requires you to go to your pharmacy to receive these vaccines. Please continue all meds as prescribed. Please follow-up with pulmonology for monitoring of COPD and medications.  Personalized Plan of Care  Health Maintenance Status       Date Due Completion Dates   Bone Density Scan Never done ---   Hepatitis C Screening Never done ---   Diabetes Screening Never done ---   DTaP/Tdap/Td Vaccines (1 - Tdap) Never done ---   ZOSTER VACCINE (1 of 2) Never done ---   Adult RSV (60+ Years or Pregnancy) (1 - 1-dose 75+ series) Never done ---   COVID-19 Vaccine (4 - 2025-26 season) 01/06/2024 01/15/2020, 08/05/2019   Depression Screening 01/08/2024 01/08/2023   Influenza Vaccine (1) 12/06/2023 02/08/2016   Comprehensive Annual Visit 01/08/2025 01/09/2024, 01/08/2023   Medicare Annual Wellness (AWV) Subsequent Visits 01/08/2025 01/09/2024, 01/08/2023          Immunization History  Administered Date(s)  Administered  . Influenza, high-dose, trivalent, PF 02/08/2016  . Pfizer SARS-CoV-2 Primary Series 12+ yrs 07/07/2019, 08/05/2019, 01/15/2020  . Pneumococcal Conjugate 13-Valent 01/07/2014  . Pneumococcal Polysaccharide Vaccine, 23 Valent (PNEUMOVAX-23) 2Y+ 02/04/2013      Advance Directives: Care Instructions Overview An advance directive is a legal way to state your  wishes at the end of your life. It tells your loved ones and doctor what to do if you can't say what you want. There are two main types of advance directives. You can change them any time your wishes change. Living will. This form tells your loved ones and doctor your wishes about life support and other treatment. The form is also called a declaration. Medical power of attorney. This form lets you name a person to make treatment decisions for you when you can't speak for yourself. This person is called a health care agent (health care proxy, health care surrogate). The form is also called a durable power of attorney for health care. If you do not have an advance directive, decisions about your medical care may be made by a family member or doctor who doesn't know you or by a judge. It may help to think of an advance directive as a gift to the people who care for you. If you have one, they won't have to make tough decisions by themselves. For more information, including forms for your state, see the [employer] website ([employer].com.cy). Follow-up care is a key part of your treatment and safety. Be sure to make and go to all appointments, and call your doctor if you are having problems. It's also a good idea to know your test results and keep a list of the medicines you take. What should you include in an advance directive? Many states have a unique advance directive form. (It may ask you to address specific issues.) Or you might use a universal form that's approved by many states. If your form doesn't tell you what to address, it may be hard to know what to include in your advance directive. Use the questions below to help you get started. Who do you want to make decisions about your medical care if you are not able to? What life-support measures do you want if you have a serious illness that gets worse over time or can't be cured? What are you most afraid of that might  happen? (Maybe you're afraid of having pain, losing your independence, or being kept alive by machines.) Where would you prefer to die? (Your home? A hospital? A nursing home?) Do you want to donate your organs when you die? Do you want certain religious practices performed before you die? When should you call for help? Be sure to contact your doctor if you have any questions. Where can you learn more? Go to WirelessRelief.nl Enter R264 in the search box to learn more about Advance Directives: Care Instructions. Current as of: September 04, 2022 Content Version: 14.5  [address], [place].  Care instructions adapted under license by your healthcare professional. If you have questions about a medical condition or this instruction, always ask your healthcare professional. Romayne Alderman, [facility] disclaims any warranty or liability for your use of this information.     A Healthy Lifestyle: Care Instructions A healthy lifestyle can help you feel good, have more energy, and stay at a weight that's healthy for you. You can share a healthy lifestyle with your friends and family. And you can do it on your own.  Eat meals with your friends or family. You could try cooking together.  Plan activities with other people. Go for a walk with a friend, try a free online fitness class, or join a sports league.   Eat a variety of healthy foods. These include fruits, vegetables, whole grains, low-fat dairy, and lean protein.  Choose healthy portions of food. You can use the Nutrition Facts label on food packages as a guide.   Eat more fruits and vegetables. You could add vegetables to sandwiches or add fruit to cereal.  Drink water when you are thirsty. Limit soda, juice, and sports drinks.   Try to exercise most days. Aim for at least 2 hours of exercise each week.  Keep moving. Work in the garden or take your dog on a walk. Use the stairs instead of the elevator.    If you use tobacco or nicotine, try to quit. Ask your doctor about programs and medicines to help you quit.  Limit alcohol. Men should have no more than 2 drinks a day. Women should have no more than 1. For some people, no alcohol is the best choice.  Follow-up care is a key part of your treatment and safety. Be sure to make and go to all appointments, and call your doctor if you are having problems. It's also a good idea to know your test results and keep a list of the medicines you take. Where can you learn more? Go to WirelessRelief.nl Enter [id number] in the search box to learn more about A Healthy Lifestyle: Care Instructions. Current as of: September 04, 2022 Content Version: 14.5  [address], [place].  Care instructions adapted under license by your healthcare professional. If you have questions about a medical condition or this instruction, always ask your healthcare professional. Romayne Alderman, [facility] disclaims any warranty or liability for your use of this information.    Problem List Items Addressed This Visit     Situational anxiety   Relevant Medications   ALPRAZolam  (XANAX ) 0.25 mg tablet   Encounter for Medicare annual wellness exam - Primary   COPD mixed type (HCC)   Relevant Medications   albuterol  HFA (PROVENTIL  HFA;VENTOLIN  HFA;PROAIR  HFA) 90 mcg/actuation inhaler   budesonide -formoteroL  (SYMBICORT ;BREYNA ) 160-4.5 mcg/actuation inhaler   ipratropium-albuteroL  (DUO-NEB) 0.5-2.5 mg/3 mL nebulizer solution   History of laryngeal cancer   Essential hypertension   Pure hypercholesterolemia   Relevant Medications   rosuvastatin  (CRESTOR ) 10 mg tablet   Other Visit Diagnoses       Encounter for physical examination         Medication monitoring encounter       Relevant Orders   CBC with Differential   Comprehensive Metabolic Panel     History of lung cancer         Age-related osteoporosis without current pathological fracture        Relevant Orders   Vitamin D , 25-Hydroxy     Encounter for hepatitis C screening test for low risk patient       Relevant Orders   Hepatitis C Virus (HCV) Antibody Screen With Confirmation       Monitor: The problem is stable Evaluation: Labs/tests ordered, see encounter summary Assessment/Treatment Follow up in 1 year  Please contact my office for worsening conditions or problems, and seek emergency medical treatment and/or call 911 if you or your family deems either necessary.  This document was created using the aid of voice recognition Scientist, clinical (histocompatibility and immunogenetics).    I have personally  spent 45 minutes involved in face-to-face and non-face-to-face activities for this patient on the day of the visit in addition to AWV/CPE.  Professional time spent includes the following activities, in addition to those noted in the documentation:  - preparing to see the patient (e.g., review of recent and/or remote lab/imaging/study results, provider notes, and patient messages/phone calls available in current EMR, CareEverywhere, and scanned records) -obtaining and/or reviewing separately obtained history either through past provider notes, patient phone calls, and/or patient's family member(s)/caregiver(s) -performing a medically appropriate examination and/or evaluation -counseling and educating the patient/family/caregiver -ordering medications, tests, or procedures -documenting clinical information in the electronic or other health record -reviewing most up to date studies or expert consensus guidelines for screening/diagnosing/treating pertinent conditions/symptoms -independently interpreting results (not separately reported) and communicating results to the patient/family/caregiver -care coordination (not separately reported) -referring and communicating with other health care professionals (when not separately reported)   Electronically signed by: Natalie Nakata James, PA-C 01/09/2024 10:22  AM [facility]  - [facility] Medicare Wellness Visit Type:: Subsequent Annual Wellness Visit  Name: Brooke [name] Date of Birth: 12-Mar-1946 Age: 78 y.o. MRN: [id number] Visit Date: 01/09/2024  History obtained from: patient  Living Arrangements/Support System/Health Assessment/Pain/Stress Marital status: divorced Number of children: 0   Living arrangements: (!) lives alone Does the patient have a support system (family, friend, church, Conservation officer, nature, etc)?: Yes   Do you have any dental concerns?: No In the past month, have you experienced a change in your bladder control?: No   Do you have any difficulty obtaining your medications?: No   Do you have trouble consistently taking or remembering to take all of your medications as prescribed?: No Patient rates overall stress level as: Some Does stress affect daily life?: No Typical amount of pain: some Does pain affect daily life?: No Are you currently prescribed opioids?: (!) Yes Do you ever use more of your medication, that is, take a higher dose, than is prescribed for you?: No Do you ever use your medication more often, that is, shorten the time between doses, than is prescribed for you?: No Do you ever need early refills for your pain medication?: No Do you ever feel high or get a buzz after using your pain medication?: No Do you ever take your pain medication because you are upset, using the medication to relieve or cope with problems other than pain?: No Have you ever gone to multiple physicians, including emergency room doctors, seeking more of your pain medication?: No OUD Score: a score of 2 or more indicates a positive screen and a possible diagnosis of OUD: 0  Depression Screening  Behavioral Health Screening  Patient Health Questionnaire-2 Score: 0 (01/09/2024  9:28 AM)      Patient's Depression screening/score = Negative    Depression Plan: Normal/Negative  Screening    Social History (Tobacco/Drugs/Sexual Activity) Chanley reports that she has quit smoking. She has never used smokeless tobacco. Tobacco Use?: No        Alcohol Screening How often do you have a drink containing alcohol?: Never How many standard drinks containing alcohol do you have on a typical day?: Never, 1 or 2 drinks How often do you have six or more drinks on one occasion?: Never Audit-C Score: 0  Physical Activity        Diet How many meals a day?: 2 Eats fruit and vegetables daily?: Yes Most meals are obtained by: preparing their own meals, eating out  Home and Transportation Safety All rugs have non-skid backing?: Yes All stairs or steps have railings?: Yes Grab bars in the bathtub or shower?: Yes Have non-skid surface in bathtub or shower?: Yes Good home lighting?: Yes Regular seat belt use?: Yes      Activities of Daily Living Feed self?: Yes Bathe self?: Yes   Use toilet without assistance?: Yes Walk without assistance?: Yes    Instrumental Activities of Daily Living Manage finances?: Yes Shop for themselves?: Yes Prepare meals?: Yes Use the telephone?: Yes Manage medications?: Yes   Performs basic housework/laundry?: Yes Drives?: No Primary transportation is: family or friends  Hearing Concerns about hearing?: No Uses hearing aids?: No Hear whispered voice? (Observed): Yes  Vision Concerns about vision?: No Vision exam performed?: (!) No  Fall Risk Is the patient ambulatory?: Yes One or more falls in the last year:: No Feels unsteady when walking:: No  Cognitive Assessment                  Advance Directives Living will?: Yes   Healthcare POA?: (!) No               Other History I reviewed and updated the following risk factors and conditions as appropriate: Reviewed/Updated: Problem List, Medical History, Surgical History, Family History, Medications, Allergies Reviewed/Updated: Vital Signs (height,  weight, and BP), Immunizations, Health Maintenance Patient Care Team Updated: Done  Vital Signs BP (!) 148/95   Pulse 90   Temp 98.2 F (36.8 C)   Ht 1.295 m (4' 3)   Wt 42 kg (92 lb 9.6 oz)   SpO2 98% Comment: 2L O2  BMI 25.03 kg/m   Screening and Immunizations Health Maintenance Status       Date Due Completion Dates   Hepatitis C Screening Never done ---   Diabetes Screening Never done ---   DTaP/Tdap/Td Vaccines (1 - Tdap) Never done ---   Adult RSV (60+ Years or Pregnancy) (1 - 1-dose 75+ series) Never done ---   COVID-19 Vaccine (4 - 2025-26 season) 01/06/2024 01/15/2020, 08/05/2019   ZOSTER VACCINE (1 of 2) 01/09/2024 (Originally 10/01/1964) ---   Comprehensive Annual Visit 01/08/2025 01/09/2024, 01/09/2024   Comment on 01/08/2023: CPE   Medicare Annual Wellness (AWV) Subsequent Visits 01/08/2025 01/09/2024, 01/09/2024   Comment on 01/08/2023: SAWV   Depression Screening 01/08/2025 01/09/2024       Immunization History  Administered Date(s) Administered  . Influenza, high-dose, trivalent, PF 02/08/2016  . Pfizer SARS-CoV-2 Primary Series 12+ yrs 07/07/2019, 08/05/2019, 01/15/2020  . Pneumococcal Conjugate 13-Valent 01/07/2014  . Pneumococcal Polysaccharide Vaccine, 23 Valent (PNEUMOVAX-23) 2Y+ 02/04/2013    Assessment/Plan: Subsequent Annual Wellness Visit: The topics above were reviewed with the patient.  Healthy lifestyle principles reviewed.  Recommendations provided when indicated.  Follow up 1 year for next wellness visit.  Orders Placed This Encounter  Procedures  . CBC with Differential  . Comprehensive Metabolic Panel  . Vitamin D , 25-Hydroxy  . Hepatitis C Virus (HCV) Antibody Screen With Confirmation  . CBC with Differential  . Hepatitis C Virus (HCV) Antibody Screen With Confirmation    New Medications Ordered This Visit  Medications  . rosuvastatin  (CRESTOR ) 10 mg tablet    Sig: Take 10 mg by mouth daily.  . MULTIVITAMIN ORAL    Sig: Take by mouth.  .  ALPRAZolam  (XANAX ) 0.25 mg tablet    Sig: Take 0.5-1 tablets (0.125-0.25 mg total) by mouth 2 (two) times a day as needed for anxiety.  Dispense:  20 tablet    Refill:  5  . albuterol  HFA (PROVENTIL  HFA;VENTOLIN  HFA;PROAIR  HFA) 90 mcg/actuation inhaler    Sig: Inhale 2 puffs every 6 (six) hours as needed for wheezing or shortness of breath.    Dispense:  8.5 g    Refill:  11  . budesonide -formoteroL  (SYMBICORT ;BREYNA ) 160-4.5 mcg/actuation inhaler    Sig: Inhale 2 puffs 2 (two) times a day.    Dispense:  10.2 g    Refill:  11  . ipratropium-albuteroL  (DUO-NEB) 0.5-2.5 mg/3 mL nebulizer solution    Sig: Use one vial in nebulizer 4 times a day as needed    Dispense:  540 mL    Refill:  11    Patient Care Team: Laneta Tanda Agent, PA-C as PCP - General (Physician Assistant) Augustin Saupe, RN as Registered Nurse  Electronically signed by: Laneta Tanda Agent, PA-C 01/09/2024 10:22 AM      [1] Past Medical History: Diagnosis Date  . Anxiety   . COPD exacerbation (HCC) 10/22/2016   Last Assessment & Plan:   Formatting of this note might be different from the original.  Slow to resolve COPD exacerbation  Appropriately treated with a Z-Pak  Chest x-ray today in office stable  Expiratory wheeze  Plan:  Depo-Medrol  120 today  Trial of Breztri    Hold Symbicort  160  Continue oxygen  therapy  Follow-up with our office in 6 weeks       . Cough with frothy sputum 01/27/2016  . Ear pain, left 07/04/2020  . GERD (gastroesophageal reflux disease)   . Hypertension   . 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 Malignant neoplasm of upper lobe of left lung    (CMD)   . Non-ST elevation (NSTEMI) myocardial infarction    (CMD) 06/05/2013   Last Assessment & Plan:  The patient has not had any recurrent chest pain or angina  [2] Current Outpatient Medications on File Prior to Visit  Medication Sig Dispense Refill  . amLODIPine  (NORVASC ) 2.5 mg tablet Take by mouth based on top number of BP. 1 tab (2.5mg )  if BP 140-159, 2 tabs (5mg ) for 160-179, 3 tabs (7.5 mg) for BP >180    . docusate sodium (COLACE) 100 mg capsule Take 100 mg by mouth Once Daily.    . MULTIVITAMIN ORAL Take by mouth.    . [DISCONTINUED] albuterol  HFA (PROVENTIL  HFA;VENTOLIN  HFA;PROAIR  HFA) 90 mcg/actuation inhaler INHALE 1 TO 2 PUFFS BY MOUTH FOUR TIMES DAILY AS NEEDED FOR WHEEZING 8.5 g 2  . [DISCONTINUED] ALPRAZolam  (XANAX ) 0.25 mg tablet TAKE 1/2 TO 1 TABLET BY MOUTH TWICE DAILY AS NEEDED FOR ANXIETY 20 tablet 0  . [DISCONTINUED] atorvastatin  (LIPITOR ) 20 mg tablet Take 20 mg by mouth.    . [DISCONTINUED] budesonide -formoteroL  (SYMBICORT ;BREYNA ) 160-4.5 mcg/actuation inhaler Inhale 2 puffs 2 (two) times a day. 10.2 g 11  . [DISCONTINUED] ipratropium-albuteroL  (DUO-NEB) 0.5-2.5 mg/3 mL nebulizer solution Use one vial in nebulizer 4 times a day as needed 540 mL 11  . rosuvastatin  (CRESTOR ) 10 mg tablet Take 10 mg by mouth daily. (Patient not taking: Reported on 01/09/2024)    . [DISCONTINUED] metroNIDAZOLE (FLAGYL) 500 mg tablet Take one tablet 3 times a day for diverticulitis (Patient not taking: Reported on 01/09/2024) 30 tablet 0   No current facility-administered medications on file prior to visit.  [3]  Current Outpatient Medications:  .  amLODIPine  (NORVASC ) 2.5 mg tablet, Take by mouth based on top number of BP. 1 tab (2.5mg ) if BP 140-159, 2 tabs (  5mg ) for 160-179, 3 tabs (7.5 mg) for BP >180, Disp: , Rfl:  .  docusate sodium (COLACE) 100 mg capsule, Take 100 mg by mouth Once Daily., Disp: , Rfl:  .  MULTIVITAMIN ORAL, Take by mouth., Disp: , Rfl:  .  albuterol  HFA (PROVENTIL  HFA;VENTOLIN  HFA;PROAIR  HFA) 90 mcg/actuation inhaler, Inhale 2 puffs every 6 (six) hours as needed for wheezing or shortness of breath., Disp: 8.5 g, Rfl: 11 .  ALPRAZolam  (XANAX ) 0.25 mg tablet, Take 0.5-1 tablets (0.125-0.25 mg total) by mouth 2 (two) times a day as needed for anxiety., Disp: 20 tablet, Rfl: 5 .  budesonide -formoteroL   (SYMBICORT ;BREYNA ) 160-4.5 mcg/actuation inhaler, Inhale 2 puffs 2 (two) times a day., Disp: 10.2 g, Rfl: 11 .  ipratropium-albuteroL  (DUO-NEB) 0.5-2.5 mg/3 mL nebulizer solution, Use one vial in nebulizer 4 times a day as needed, Disp: 540 mL, Rfl: 11 .  rosuvastatin  (CRESTOR ) 10 mg tablet, Take 10 mg by mouth daily. (Patient not taking: Reported on 01/09/2024), Disp: , Rfl:  [4] Allergies Allergen Reactions  . Oxymetazoline  Angioedema  . Budesonide  Mental Status Changes  . Codeine GI Intolerance  . Prednisone  Other (See Comments)    Reaction:Abnormal behavior; cannot take in pill form but CAN tolerate the injection  . Isosorbide  Mononitrate Rash    headache  [5] Past Surgical History: Procedure Laterality Date  . TONSILLECTOMY     Procedure: TONSILLECTOMY  [6] Family History Problem Relation Name Age of Onset  . COPD Mother    . Lung cancer Father    [7] Social History Socioeconomic History  . Marital status: Divorced  Tobacco Use  . Smoking status: Former  . Smokeless tobacco: Never  Substance and Sexual Activity  . Alcohol use: No  . Drug use: No   Social Drivers of Health   Food Insecurity: Low Risk  (01/09/2024)   Food vital sign   . Within the past 12 months, you worried that your food would run out before you got money to buy more: Never true   . Within the past 12 months, the food you bought just didn't last and you didn't have money to get more: Never true  Transportation Needs: No Transportation Needs (01/09/2024)   Transportation   . In the past 12 months, has lack of reliable transportation kept you from medical appointments, meetings, work or from getting things needed for daily living? : No  Safety: Low Risk  (01/09/2024)   Safety   . How often does anyone, including family and friends, physically hurt you?: Never   . How often does anyone, including family and friends, insult or talk down to you?: Never   . How often does anyone, including family and friends,  threaten you with harm?: Never   . How often does anyone, including family and friends, scream or curse at you?: Never  Living Situation: Low Risk  (01/09/2024)   Living Situation   . What is your living situation today?: I have a steady place to live   . Think about the place you live. Do you have problems with any of the following? Choose all that apply:: None/None on this list

## 2024-01-09 NOTE — Patient Instructions (Addendum)
 You were seen today for your annual physical exam. If you have the patient portal set up, we will message with the results of your labs. If you don't have the patient portal set up, we will call you with the results of your labs. Please consider getting the pneumonia vaccine (Prevnar-20). You can get this at your pharmacy or at our office. Please consider the tetanus and shingles vaccines. Medicare requires you to go to your pharmacy to receive these vaccines. Please continue all meds as prescribed. Please follow-up with pulmonology for monitoring of COPD and medications.  Personalized Plan of Care  Health Maintenance Status       Date Due Completion Dates   Bone Density Scan Never done ---   Hepatitis C Screening Never done ---   Diabetes Screening Never done ---   DTaP/Tdap/Td Vaccines (1 - Tdap) Never done ---   ZOSTER VACCINE (1 of 2) Never done ---   Adult RSV (60+ Years or Pregnancy) (1 - 1-dose 75+ series) Never done ---   COVID-19 Vaccine (4 - 2025-26 season) 01/06/2024 01/15/2020, 08/05/2019   Depression Screening 01/08/2024 01/08/2023   Influenza Vaccine (1) 12/06/2023 02/08/2016   Comprehensive Annual Visit 01/08/2025 01/09/2024, 01/08/2023   Medicare Annual Wellness (AWV) Subsequent Visits 01/08/2025 01/09/2024, 01/08/2023          Immunization History  Administered Date(s) Administered  . Influenza, high-dose, trivalent, PF 02/08/2016  . Pfizer SARS-CoV-2 Primary Series 12+ yrs 07/07/2019, 08/05/2019, 01/15/2020  . Pneumococcal Conjugate 13-Valent 01/07/2014  . Pneumococcal Polysaccharide Vaccine, 23 Valent (PNEUMOVAX-23) 2Y+ 02/04/2013      Advance Directives: Care Instructions Overview An advance directive is a legal way to state your wishes at the end of your life. It tells your loved ones and doctor what to do if you can't say what you want. There are two main types of advance directives. You can change them any time your wishes change. Living will. This form tells your  loved ones and doctor your wishes about life support and other treatment. The form is also called a declaration. Medical power of attorney. This form lets you name a person to make treatment decisions for you when you can't speak for yourself. This person is called a health care agent (health care proxy, health care surrogate). The form is also called a durable power of attorney for health care. If you do not have an advance directive, decisions about your medical care may be made by a family member or doctor who doesn't know you or by a judge. It may help to think of an advance directive as a gift to the people who care for you. If you have one, they won't have to make tough decisions by themselves. For more information, including forms for your state, see the [employer] website ([employer]cy). Follow-up care is a key part of your treatment and safety. Be sure to make and go to all appointments, and call your doctor if you are having problems. It's also a good idea to know your test results and keep a list of the medicines you take. What should you include in an advance directive? Many states have a unique advance directive form. (It may ask you to address specific issues.) Or you might use a universal form that's approved by many states. If your form doesn't tell you what to address, it may be hard to know what to include in your advance directive. Use the questions below to help you get started. Who do you want to  make decisions about your medical care if you are not able to? What life-support measures do you want if you have a serious illness that gets worse over time or can't be cured? What are you most afraid of that might happen? (Maybe you're afraid of having pain, losing your independence, or being kept alive by machines.) Where would you prefer to die? (Your home? A hospital? A nursing home?) Do you want to donate your organs when you die? Do you want  certain religious practices performed before you die? When should you call for help? Be sure to contact your doctor if you have any questions. Where can you learn more? Go to WirelessRelief.nl Enter R264 in the search box to learn more about Advance Directives: Care Instructions. Current as of: September 04, 2022 Content Version: 14.5  [address], [place].  Care instructions adapted under license by your healthcare professional. If you have questions about a medical condition or this instruction, always ask your healthcare professional. Romayne Alderman, [facility] disclaims any warranty or liability for your use of this information.     A Healthy Lifestyle: Care Instructions A healthy lifestyle can help you feel good, have more energy, and stay at a weight that's healthy for you. You can share a healthy lifestyle with your friends and family. And you can do it on your own.  Eat meals with your friends or family. You could try cooking together.  Plan activities with other people. Go for a walk with a friend, try a free online fitness class, or join a sports league.   Eat a variety of healthy foods. These include fruits, vegetables, whole grains, low-fat dairy, and lean protein.  Choose healthy portions of food. You can use the Nutrition Facts label on food packages as a guide.   Eat more fruits and vegetables. You could add vegetables to sandwiches or add fruit to cereal.  Drink water when you are thirsty. Limit soda, juice, and sports drinks.   Try to exercise most days. Aim for at least 2 hours of exercise each week.  Keep moving. Work in the garden or take your dog on a walk. Use the stairs instead of the elevator.   If you use tobacco or nicotine, try to quit. Ask your doctor about programs and medicines to help you quit.  Limit alcohol. Men should have no more than 2 drinks a day. Women should have no more than 1. For some people, no alcohol is  the best choice.  Follow-up care is a key part of your treatment and safety. Be sure to make and go to all appointments, and call your doctor if you are having problems. It's also a good idea to know your test results and keep a list of the medicines you take. Where can you learn more? Go to [employer]Rel[employer].nl Enter [id number] in the search box to learn more about A Healthy Lifestyle: Care Instructions. Current as of: September 04, 2022 Content Version: 14.5  [address], [place].  Care instructions adapted under license by your healthcare professional. If you have questions about a medical condition or this instruction, always ask your healthcare professional. Romayne Alderman, [facility] disclaims any warranty or liability for your use of this information.

## 2024-01-28 ENCOUNTER — Inpatient Hospital Stay: Attending: Oncology | Admitting: Oncology

## 2024-01-28 VITALS — BP 142/90 | HR 89 | Temp 98.1°F | Resp 18 | Ht 63.0 in | Wt 98.1 lb

## 2024-01-28 DIAGNOSIS — C3412 Malignant neoplasm of upper lobe, left bronchus or lung: Secondary | ICD-10-CM | POA: Diagnosis not present

## 2024-01-28 DIAGNOSIS — Z85118 Personal history of other malignant neoplasm of bronchus and lung: Secondary | ICD-10-CM | POA: Insufficient documentation

## 2024-01-28 NOTE — Progress Notes (Signed)
  [facility] OFFICE PROGRESS NOTE   Diagnosis: Non-small cell lung cancer  INTERVAL HISTORY:   Brooke [name] returns as scheduled.  She has a good appetite.  She has noted improvement in dyspnea.  She feels weaker .  She has intermittent left abdominal pain that is improved after a bowel movement.  The bowel movements are again thin.  Objective:  Vital signs in last 24 hours:  Blood pressure (!) 142/90, pulse 89, temperature 98.1 F (36.7 C), temperature source Temporal, resp. rate 18, height 5' 3 (1.6 m), weight 98 lb 1.6 oz (44.5 kg), SpO2 100%.    Lymphatics: No cervical, supraclavicular, axillary, or inguinal nodes Resp: Lungs clear bilaterally, distant breath sounds, bronchial sounds at the left upper posterior chest Cardio: Regular rate and rhythm GI: No hepatosplenomegaly, no mass Vascular: No leg edema   Lab Results:  Lab Results  Component Value Date   WBC 12.1 (H) 11/15/2022   HGB 13.0 11/15/2022   HCT 39.4 11/15/2022   MCV 88.3 11/15/2022   PLT 174 11/15/2022   NEUTROABS 11.3 (H) 11/15/2022    CMP  Lab Results  Component Value Date   NA 137 11/27/2023   K 4.7 11/27/2023   CL 98 11/27/2023   CO2 25 11/27/2023   GLUCOSE 79 11/27/2023   BUN 9 11/27/2023   CREATININE 0.43 (L) 11/27/2023   CALCIUM  9.2 11/27/2023   PROT 6.9 11/27/2023   ALBUMIN  4.2 11/27/2023   AST 23 11/27/2023   ALT 15 11/27/2023   ALKPHOS 74 11/27/2023   BILITOT 0.4 11/27/2023   GFRNONAA >60 01/31/2023   GFRAA >60 02/05/2020   Medications: I have reviewed the patient's current medications.   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 PDL1 60% Left lung radiation 11/08/2016 through 11/21/2016 CT chest 12/12/2016-reexpansion of left upper lobe with a decreased left upper lobe mass, unchanged mediastinal adenopathy, progression of a splenic metastasis/pancreatic tail/gastrohepatic ligament metastasis, right lower lobe pneumonia Cycle 1 Pembrolizumab  12/14/2016 Cycle 2 Pembrolizumab  01/03/2017 Cycle 3 Pembrolizumab  01/24/2017 Cycle 4 Pembrolizumab  02/12/2017 CT 03/05/2018-decrease in left upper lobe mass, mediastinal adenopathy, and splenic mass Cycle 5 pembrolizumab  03/07/2017 Cycle 6 Pembrolizumab  04/02/2017 Cycle 7 pembrolizumab  04/25/2017 Cycle 8 Pembrolizumab  05/14/2017 Cycle 9 Pembrolizumab  06/06/2017 CT 06/20/2017-mild decrease in left upper lobe mass, mild increase in paramediastinal left upper lobe nodule-radiation change?, decreased splenic metastasis Cycle 10 pembrolizumab  06/25/2017 Cycle 11 pembrolizumab  07/18/2017 Cycle 12 pembrolizumab  08/29/2017 Cycle 13 pembrolizumab  09/17/2017 Cycle 14 Pembrolizumab  10/10/2017 CT chest 10/24/2017- slight enlargement of small mediastinal lymph nodes, increased left upper lobe consolidation, decreased size of spleen lesion Cycle 15 pembrolizumab  10/29/2017 Cycle 16 Pembrolizumab  11/21/2017 Cycle 17 Pembrolizumab  12/11/2017 Cycle 18 pembrolizumab  01/02/2018 Cycle 19 Pembrolizumab  01/21/2018 Cycle 20 Pembrolizumab  02/13/2018 CT chest 02/27/2018- decreased left paratracheal node, progressive consolidation/fibrosis in the left upper lung, decreased size of splenic mass Cycle 21 pembrolizumab  03/04/2018 Cycle 22 Pembrolizumab  03/27/2018  Cycle 23 pembrolizumab  04/15/2018 Cycle 24 pembrolizumab  05/08/2018 Cycle 25 Pembrolizumab  05/27/2018 CT chest 06/16/2018- stable left upper lung radiation fibrosis with no evidence of local tumor recurrence.  Decreased left paratracheal adenopathy.  No new or progressive  metastatic disease in the chest.  Gastrohepatic ligament lymphadenopathy stable.  Splenic metastasis decreased.  T5 vertebral compression fracture with associated patchy sclerosis and no discrete osseous lesion, new in the interval. Cycle 26 Pembrolizumab  06/19/2018 Cycle 27 pembrolizumab  07/08/2018 Cycle 28 pembrolizumab  07/31/2018 Cycle 29 pembrolizumab  08/19/2018 Cycle 30 Pembrolizumab  09/11/2018 Cycle 31 pembrolizumab  09/30/2018 Cycle 32 pembrolizumab  10/23/2018 CT chest 10/28/2018- left apical pleural-parenchymal opacity consistent with radiation scarring has become more confluent likely representing evolutionary change.  Continued further decrease in size of index left paratracheal node and splenic metastasis.  Gastrohepatic ligament lymph node not changed. Cycle 33 Pembrolizumab  11/11/2018 Cycle 34 pembrolizumab  12/04/2018 Cycle 35 pembrolizumab  12/23/2018 CT chest 02/12/2019- posttreatment changes left upper lobe unchanged.  Continued decrease in size of splenic metastasis.  Stable appearance of left paratracheal lymph nodes.  Stable gastrohepatic adenopathy.  Incomplete visualization of retroperitoneal lymph nodes in the upper abdomen. CT chest 07/02/2019-stable post treatment related changes left lung.  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  Slight regression of enlarged likely metastatic gastrohepatic lymph node. CT chest 11/11/2019-interval enlargement of a right paratracheal lymph node measuring 14 mm, increased from 5 mm.  No new lung mass or nodularity.  New adenopathy in the upper abdomen.  2.7 cm lesion gastrohepatic ligament.  Necrotic node at the splenic hilum measures 4.7 cm.  Large node along the IVC left upper quadrant measures 2.7 cm.  Similar node left adjacent the aorta measures 1.8 cm.  Enhancing lesion in the spleen is unchanged.  Several low-density lesions in the liver are unchanged. Pembrolizumab  resumed on a 3-week schedule 12/03/2019 CT chest 02/22/2020-interval resolution of  previously demonstrated left paratracheal and upper abdominal adenopathy.  Stable treated metastasis within the spleen.  Stable radiation changes in the left lung with left hilar distortion.  No evidence of local recurrence or progressive metastatic disease. Continuation of pembrolizumab  every 3 weeks 02/26/2020 CT chest 06/28/2020-no change in left upper lobe consolidation, unchanged spleen metastasis, no evidence of progressive disease Pembrolizumab  continued every 3 weeks 07/01/2020 CT chest 11/02/2020-stable posttreatment changes at the left hilum and left upper lobe, decreased size of spleen lesion, no evidence of disease progression Pembrolizumab  every 3 weeks continued CT chest 01/12/2021-negative for acute pulmonary embolus.  Stable radiation fibrosis left upper lobe.  Stable low-density lesion in the spleen.  No new or progressive findings. Pembrolizumab  every 3 weeks continued CT chest 05/17/2021-unchanged T6 compression fracture, new compression fracture at T8 with 30% loss of vertebral body height, chronic postradiation fibrosis in the upper left lung, no evidence of recurrent or metastatic disease in the chest.  Splenic lesion stable.  No upper abdominal lymphadenopathy. Pembrolizumab  every 3 weeks continued CT chest 08/29/2021-unchanged consolidation/fibrosis at the left upper lung, no evidence of metastatic disease in the chest, COPD, multiple compression fractures, new T11 compression fracture, stable spleen lesion by my review Pembrolizumab  every 3 weeks continued Treatment held 10/13/2021 for diarrhea, C. difficile negative Pembrolizumab  11/10/2021 Pembrolizumab  12/01/2021 CTs 12/07/2021-similar treatment effect within the left upper lobe including volume loss, traction bronchiectasis and consolidation.  No local recurrent or active metastatic disease.  Diffuse colitis most significant within the cecum.  New trace left pleural fluid. CTs 03/11/2022-stable radiation fibrosis in the upper left lung,  no evidence of local tumor recurrence, no evidence of metastatic disease, normal large bowel without wall thickening CTs 07/10/2022-stable volume loss in the left hemithorax, stable small right middle lobe nodules, stable complex cystic/calcified lesion in the spleen-slightly more dense, persistent isolated biliary dilation in segment 5 colonic stool, stable thoracic compression fractures CTs 01/31/2023-posttreatment scarring left upper lobe.  No  evidence of recurrent or metastatic disease. CT chest 01/01/2024-stable posttreatment changes in the left upper lobe, no evidence of recurrent disease     2.  05/29/2019 laryngoscopy-exophytic appearing mass mid right vocal cord.   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  Tumor cells negative for p16, CMV and HSV 2.  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 Radiation 07/29/2019-08/25/2019   3. History of acute respiratory failure secondary to #1   4. Oxygen  dependent COPD   5. History of a NSTEMI 2015   6. Right lung pneumonia on chest CT 12/12/2016-treated with Levaquin    7.  Grade 2 rash possibly related to Pembrolizumab .  Treatment held 08/06/2017.  Improved 08/29/2017, treatment resumed, rash has resolved   8.  Vertigo January 2022-improved with course of Augmentin  for ear/sinus infection   9.  Diarrhea beginning early June 2023; negative C. difficile 10/13/2021; CTs 12/07/2021-diffuse colitis most significant within the cecum. -persistent left abdominal pain and constipation, Virtual colonosocopy 11/15/2022- no mass, stricture, or polyp   10.  Isolated segment 5 bile duct dilation         Disposition: Ms. Kanode remains in clinical remission from lung cancer.  She will return for an office visit in 6 months.  She will continue follow-up with pulmonary medicine for management of COPD.  She has been evaluated by gastroenterology for the left abdominal pain and narrow caliber stool.  She declined an  influenza vaccine.  Arley Hof, MD  01/28/2024  11:47 AM

## 2024-04-01 NOTE — Progress Notes (Addendum)
 [name] presents for a follow up after completing radiation to her larynx on 08/25/2019 (She also completed radiation to her left lung on 11/21/2016.  Patient has been doing okay, her voice is becoming weaker and voice is very hoarse.   Pain issues, if any: None, Has some sinus drainage and drains down to her throat, when it occurs she coughs up a productive amount of sputum that is yellowish. Using a feeding tube?: None Weight changes, if any: Lost weight, was 92 pounds and now 89 pounds. Does not have the energy to cook, has meals delivered to her home.Does have a nurse tech that comes twice a week and helps with getting her around. Swallowing issues, if any: Sometimes she does have trouble swallowing, feels like something is in her throat. Ear feels stuffed up. Smoking or chewing tobacco? None Using fluoride toothpaste daily? Yes, uses prevadent toothpaste Last ENT visit was on: Visits yearly  Other notable issues, if any:  Has some sinus drainage and drains down to her throat, when it occurs she coughs up a productive amount of sputum that is yellowish.  BP (!) 137/99 (BP Location: Right Arm, Patient Position: Sitting, Cuff Size: Normal)   Pulse 85   Temp (!) 96.9 F (36.1 C)   Resp 20   Ht 5' 3 (1.6 m)   Wt 89 lb 3.2 oz (40.5 kg)   SpO2 100%   PF (!) 1 L/min   BMI 15.80 kg/m     Wt Readings from Last 3 Encounters:  04/10/24 89 lb 3.2 oz (40.5 kg)  01/28/24 98 lb 1.6 oz (44.5 kg)  11/27/23 96 lb (43.5 kg)

## 2024-04-10 ENCOUNTER — Ambulatory Visit
Admission: RE | Admit: 2024-04-10 | Discharge: 2024-04-10 | Disposition: A | Payer: Self-pay | Source: Ambulatory Visit | Attending: Radiation Oncology | Admitting: Radiation Oncology

## 2024-04-10 ENCOUNTER — Ambulatory Visit

## 2024-04-10 ENCOUNTER — Other Ambulatory Visit: Payer: Self-pay

## 2024-04-10 VITALS — BP 137/99 | HR 85 | Temp 96.9°F | Resp 20 | Ht 63.0 in | Wt 89.2 lb

## 2024-04-10 DIAGNOSIS — C320 Malignant neoplasm of glottis: Secondary | ICD-10-CM

## 2024-04-10 NOTE — Progress Notes (Signed)
 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  Patient has been doing okay, her voice is becoming weaker and voice is very hoarse.   Pain issues, if any: None, Has some sinus drainage and drains down to her throat, when it occurs she coughs up a productive amount of sputum that is yellowish. Using a feeding tube?: None Weight changes, if any: Lost weight, was 92 pounds and now 89 pounds. Does not have the energy to cook, has meals delivered to her home. Does have a nurse tech that comes twice a week and helps with getting her around. Swallowing issues, if any: Sometimes she does have trouble swallowing, feels like something is in her throat. Ear feels stuffed up. Smoking or chewing tobacco? None Using fluoride toothpaste daily? Yes, uses prevident toothpaste Last ENT visit was on: Visits yearly  Other notable issues, if any:  Has some sinus drainage and drains down to her throat, when it occurs she coughs up a productive amount of sputum that is yellowish.  ALLERGIES:  is allergic to afrin [oxymetazoline ], pulmicort  [budesonide ], sulfa antibiotics, nitrofuran derivatives, codeine, imdur  [isosorbide  dinitrate], and prednisone .  Meds: Current  Outpatient Medications  Medication Sig Dispense Refill   acetaminophen  (TYLENOL ) 325 MG tablet Per bottle as needed     albuterol  (VENTOLIN  HFA) 108 (90 Base) MCG/ACT inhaler Inhale 2 puffs into the lungs every 4 (four) hours as needed.     ALPRAZolam  (XANAX ) 0.25 MG tablet Take 0.5-1 tablets by mouth 2 (two) times daily as needed.     amLODipine  (NORVASC ) 2.5 MG tablet TAKE 1 TABLET IF BLOOD PRESSURE(140-159), 2 TABLETS(160-179), 3 TABLETS FOR BLOOD PRESSURE GREATER THAN 180 180 tablet 3   budesonide -formoterol  (SYMBICORT ) 160-4.5 MCG/ACT inhaler Inhale 2 puffs into the lungs 2 (two) times daily.     guaiFENesin  (MUCINEX ) 600 MG 12 hr tablet Take 600 mg by mouth 2 (two) times daily as needed for cough or to loosen phlegm.     ipratropium-albuterol  (DUONEB) 0.5-2.5 (3) MG/3ML SOLN Take 3 mLs by nebulization every 4 (four) hours as needed (wheezing/shortness of breath). **PLAN C**     OXYGEN  2lpm 24/7     rosuvastatin  (CRESTOR ) 10 MG tablet Take 1 tablet (10 mg total) by mouth daily. 90 tablet 3   Simethicone  180 MG CAPS Use as needed     No current facility-administered medications for this encounter.    Physical Findings: The patient is in no acute distress. Patient is alert and oriented. Wt Readings from Last 3 Encounters:  04/10/24 89 lb 3.2 oz (40.5 kg)  01/28/24 98 lb 1.6 oz (44.5 kg)  11/27/23 96 lb (43.5 kg)    height is  5' 3 (1.6 m) and weight is 89 lb 3.2 oz (40.5 kg). Her temperature is 96.9 F (36.1 C) (abnormal). Her blood pressure is 137/99 (abnormal) and her pulse is 85. Her respiration is 20 and oxygen  saturation is 100%. .  General: Alert and oriented, in no acute distress; hoarseness is stable.  Extraocular movements are intact. HEENT: Head is normocephalic. Extraocular movements are intact.   No lesions in upper throat/mouth.,  Tympanic membranes are clear. Neck is notable for stable mild lymphedema in anterior neck, no masses; skin intact Skin: Skin in treatment fields  shows satisfactory healing with no lesions over her neck Psychiatric: Judgment and insight are intact. Affect is appropriate. Heart : RRR Chest:  CTAB with decreased breath sounds bilaterally Abd: NT ND and soft to palpation EXT:  no edema  LARYNGOSCOPY PROCEDURE NOTE: After obtaining consent and spraying nasal cavity with topical lidocaine  and oxymetazoline , the flexible endoscope was coated with lidocaine  gel and introduced and passed through the nasal cavity.  The nasopharynx, oropharynx, hypopharynx, and larynx were then examined. No lesions appreciated in the mucosal axis.  The true cords were symmetrically mobile.  The patient tolerated the procedure well.     Lab Findings: Lab Results  Component Value Date   WBC 12.1 (H) 11/15/2022   HGB 13.0 11/15/2022   HCT 39.4 11/15/2022   MCV 88.3 11/15/2022   PLT 174 11/15/2022    Lab Results  Component Value Date   TSH 1.060 04/12/2023    Radiographic Findings: No results found.   Impression/Plan:    1) Head and Neck Cancer Status: NED - see LARYNGOSCOPY PROCEDURE NOTE  Congestive symptoms are unlikely to be related to cancer  2) Nutritional Status: Continued weight loss, eat high calorie high-protein foods as tolerated.  She does have support from a meal delivery service.  I will refer her to nutritionist to see if this can be coordinated with her medical oncology appointment in March.  Wt Readings from Last 3 Encounters:  04/10/24 89 lb 3.2 oz (40.5 kg)  01/28/24 98 lb 1.6 oz (44.5 kg)  11/27/23 96 lb (43.5 kg)   PEG tube: None  3) Risk Factors: The patient has been educated about risk factors including alcohol and tobacco abuse; they understand that avoidance of alcohol and tobacco is important to prevent recurrences as well as other cancers  4) Swallowing: good function  5)  Thyroid  function: Check annually -she would like to get this lab work done when she sees her medical oncologist in March.  I will ask Dr  Cloretta to do so on an annual basis Lab Results  Component Value Date   TSH 1.060 04/12/2023    6)  It has been 5 years since her diagnosis.  She is without evidence of disease.  I consider her cured and I will see her back on an as-needed basis.  She will continue to follow with medical oncology.  I wished her the best   On date of service, in total, I spent 30 minutes on this encounter.  Patient seen in person. _____________________________________   Lauraine Golden, MD

## 2024-04-10 NOTE — Addendum Note (Signed)
 Encounter addended by: Mohamed-Medani, Elverna LABOR, RN on: 04/10/2024 5:42 PM  Actions taken: Charge Capture section accepted

## 2024-04-10 NOTE — Addendum Note (Signed)
 Encounter addended by: Mohamed-Medani, Elverna LABOR, RN on: 04/10/2024 5:41 PM  Actions taken: Charge Capture section accepted

## 2024-04-13 ENCOUNTER — Other Ambulatory Visit: Payer: Self-pay | Admitting: Nurse Practitioner

## 2024-04-13 ENCOUNTER — Other Ambulatory Visit: Payer: Self-pay

## 2024-04-13 ENCOUNTER — Inpatient Hospital Stay: Attending: Oncology

## 2024-04-13 ENCOUNTER — Telehealth: Payer: Self-pay | Admitting: Radiation Oncology

## 2024-04-13 DIAGNOSIS — Z85118 Personal history of other malignant neoplasm of bronchus and lung: Secondary | ICD-10-CM | POA: Insufficient documentation

## 2024-04-13 DIAGNOSIS — R5383 Other fatigue: Secondary | ICD-10-CM | POA: Insufficient documentation

## 2024-04-13 DIAGNOSIS — Z79899 Other long term (current) drug therapy: Secondary | ICD-10-CM | POA: Insufficient documentation

## 2024-04-13 DIAGNOSIS — C3412 Malignant neoplasm of upper lobe, left bronchus or lung: Secondary | ICD-10-CM

## 2024-04-13 DIAGNOSIS — R634 Abnormal weight loss: Secondary | ICD-10-CM | POA: Insufficient documentation

## 2024-04-13 DIAGNOSIS — C320 Malignant neoplasm of glottis: Secondary | ICD-10-CM

## 2024-04-13 DIAGNOSIS — J4489 Other specified chronic obstructive pulmonary disease: Secondary | ICD-10-CM | POA: Insufficient documentation

## 2024-04-13 NOTE — Addendum Note (Signed)
 Encounter addended by: Mohamed-Medani, Elverna LABOR, RN on: 04/13/2024 3:03 PM  Actions taken: Charge Capture section accepted

## 2024-04-13 NOTE — Progress Notes (Signed)
 [facility] Clinical Social Work  Clinical Social Work was referred by engineer, civil (consulting) for [facility] [facility] needs.  Clinical Social Worker contacted patient by phone to offer support and assess for needs.  Patient denied any transportation concerns. Patient is well connected to transportation services here at the [facility]. Patient prefers to travel with her caregiver, which can impact scheduling. Messaged relayed to medical team.    Lizbeth Sprague, LCSW  Clinical Social Worker [facility]

## 2024-04-13 NOTE — Telephone Encounter (Signed)
 12/8 Called and spoke to patient to be sch for outgoing referral for Nutrition.  Patient is aware no appts available on same day as appt with Dr. Cloretta - [facility] in [place]. Patient prefers that Tuesday, 3/24, if not same day.  Patient also requested prescription for wound cream to be sent to [facility] in [place].  Secure chat sent to Nursing and [facility] SAILOR., so they are aware.

## 2024-04-14 ENCOUNTER — Telehealth: Payer: Self-pay | Admitting: Oncology

## 2024-04-14 ENCOUNTER — Telehealth: Payer: Self-pay | Admitting: *Deleted

## 2024-04-14 NOTE — Telephone Encounter (Signed)
 Returned patient's phone call, spoke with patient

## 2024-04-14 NOTE — Telephone Encounter (Signed)
 Called to schedule lab, day and time confirmed.

## 2024-04-16 ENCOUNTER — Other Ambulatory Visit: Payer: Self-pay | Admitting: *Deleted

## 2024-04-16 ENCOUNTER — Telehealth: Payer: Self-pay | Admitting: *Deleted

## 2024-04-16 ENCOUNTER — Inpatient Hospital Stay

## 2024-04-16 DIAGNOSIS — R5383 Other fatigue: Secondary | ICD-10-CM | POA: Diagnosis not present

## 2024-04-16 DIAGNOSIS — Z85118 Personal history of other malignant neoplasm of bronchus and lung: Secondary | ICD-10-CM | POA: Diagnosis present

## 2024-04-16 DIAGNOSIS — C3412 Malignant neoplasm of upper lobe, left bronchus or lung: Secondary | ICD-10-CM

## 2024-04-16 DIAGNOSIS — Z79899 Other long term (current) drug therapy: Secondary | ICD-10-CM | POA: Diagnosis not present

## 2024-04-16 DIAGNOSIS — J4489 Other specified chronic obstructive pulmonary disease: Secondary | ICD-10-CM | POA: Diagnosis not present

## 2024-04-16 DIAGNOSIS — R634 Abnormal weight loss: Secondary | ICD-10-CM | POA: Diagnosis not present

## 2024-04-16 LAB — CBC WITH DIFFERENTIAL (CANCER CENTER ONLY)
Abs Immature Granulocytes: 0.01 K/uL (ref 0.00–0.07)
Basophils Absolute: 0 K/uL (ref 0.0–0.1)
Basophils Relative: 1 %
Eosinophils Absolute: 0.1 K/uL (ref 0.0–0.5)
Eosinophils Relative: 2 %
HCT: 40.2 % (ref 36.0–46.0)
Hemoglobin: 13.3 g/dL (ref 12.0–15.0)
Immature Granulocytes: 0 %
Lymphocytes Relative: 15 %
Lymphs Abs: 0.8 K/uL (ref 0.7–4.0)
MCH: 29.4 pg (ref 26.0–34.0)
MCHC: 33.1 g/dL (ref 30.0–36.0)
MCV: 88.9 fL (ref 80.0–100.0)
Monocytes Absolute: 0.6 K/uL (ref 0.1–1.0)
Monocytes Relative: 11 %
Neutro Abs: 3.7 K/uL (ref 1.7–7.7)
Neutrophils Relative %: 71 %
Platelet Count: 255 K/uL (ref 150–400)
RBC: 4.52 MIL/uL (ref 3.87–5.11)
RDW: 12.8 % (ref 11.5–15.5)
WBC Count: 5.2 K/uL (ref 4.0–10.5)
nRBC: 0 % (ref 0.0–0.2)

## 2024-04-16 LAB — CMP (CANCER CENTER ONLY)
ALT: 9 U/L (ref 0–44)
AST: 19 U/L (ref 15–41)
Albumin: 4.3 g/dL (ref 3.5–5.0)
Alkaline Phosphatase: 88 U/L (ref 38–126)
Anion gap: 10 (ref 5–15)
BUN: 10 mg/dL (ref 8–23)
CO2: 29 mmol/L (ref 22–32)
Calcium: 9.9 mg/dL (ref 8.9–10.3)
Chloride: 97 mmol/L — ABNORMAL LOW (ref 98–111)
Creatinine: 0.44 mg/dL (ref 0.44–1.00)
GFR, Estimated: >60 mL/min (ref >60)
Glucose, Bld: 99 mg/dL (ref 70–99)
Potassium: 4 mmol/L (ref 3.5–5.1)
Sodium: 136 mmol/L (ref 135–145)
Total Bilirubin: 0.4 mg/dL (ref 0.0–1.2)
Total Protein: 7.5 g/dL (ref 6.5–8.1)

## 2024-04-16 LAB — TSH: TSH: 1.39 u[IU]/mL (ref 0.350–4.500)

## 2024-04-16 NOTE — Telephone Encounter (Signed)
 Makenzi presented today for labs. Instead of using w/c to come in from car, she walked an upward incline to get to lobby and was short of breath. Was called by reception to assess her. Brought her to exam room and v/s were checked and were stable. Reports the cold weather and long walk caused some brief discomfort but she is at her baseline now. Was already on her oxygen  from home.  Weight 89 lb BP 139/82 (sitting) and 130/72 standing Pulse 84 Respirations 18 Sat on oxygen  100% Temp 98.2 She is stable to go home. Took patient to lobby in w/c where her care assistance had car parked to pick her up at the entrance.

## 2024-04-21 ENCOUNTER — Inpatient Hospital Stay: Admitting: Nutrition

## 2024-04-21 NOTE — Progress Notes (Signed)
 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  CT of her chest showed progression/metastasis and she began pembrolizumab .  CT of chest in August 2025 showed stable posttreatment changes in the left upper lobe, no evidence of recurrent disease.  Patient is in clinical remission.  Past medical history includes acute respiratory failure, oxygen  dependent COPD, NSTEMI, pneumonia  Medications include Xanax   Labs were reviewed.  Height: 5 feet 3 inches. Weight: 89.2 pounds April 10, 2024 98.1 pounds January 28, 2024 88.4 pounds April 12, 2023  9% weight loss over 3 months.  Patient reports she has increased fatigue.  She is sleeping a lot throughout the day.  She feels more tired after physical therapy.  She has a good appetite.  Reports people come in the morning which is the time when she has the most energy.  This is why she is unable to [place] prepare meals.  She has Meals on Wheels delivered to her home 3 days a week.  She is drinking 1 original boost daily.  Nutrition diagnosis: Unintended weight loss related to inadequate oral intake as evidenced by 9% weight loss over 3 months.  Intervention: Encourage patient to set alarms to remind her to eat throughout the day. Reviewed strategies for purchasing or preparing easy foods to warm up when she is feeling tired. Recommend change boost to boost plus and increase to 2 cartons daily. Mail nutrition facts sheets and coupons to patient's home address. Questions answered.  Encouragement provided.  Monitoring, evaluation, goals: Patient will manage time in order to prepare foods and increase calories and protein to minimize further weight loss.  No follow-up/patient has completed treatment.  **Disclaimer: This note was dictated with voice recognition software. Similar sounding words can  inadvertently be transcribed and this note may contain transcription errors which may not have been corrected upon publication of note.** **

## 2024-04-24 ENCOUNTER — Telehealth: Payer: Self-pay | Admitting: *Deleted

## 2024-04-24 NOTE — Telephone Encounter (Signed)
 Called Brooke [name] to f/u on her status since coming to office on 12/11 with SOB. She reports doing well-baseline. No need for f/u at this time. Routed TSH lab to Dr. Izell. Brooke [name] was made aware of her lab results.

## 2024-07-29 ENCOUNTER — Ambulatory Visit: Admitting: Oncology
